# Patient Record
Sex: Male | Born: 1940 | Race: White | Hispanic: No | Marital: Married | Smoking: Former smoker
Health system: Southern US, Community
[De-identification: ages and names within clinical notes are randomized; demographics above are authoritative.]

## PROBLEM LIST (undated history)

## (undated) DIAGNOSIS — R300 Dysuria: Secondary | ICD-10-CM

## (undated) DIAGNOSIS — K279 Peptic ulcer, site unspecified, unspecified as acute or chronic, without hemorrhage or perforation: Secondary | ICD-10-CM

## (undated) DIAGNOSIS — K219 Gastro-esophageal reflux disease without esophagitis: Secondary | ICD-10-CM

## (undated) DIAGNOSIS — I6350 Cerebral infarction due to unspecified occlusion or stenosis of unspecified cerebral artery: Secondary | ICD-10-CM

## (undated) DIAGNOSIS — R609 Edema, unspecified: Secondary | ICD-10-CM

## (undated) DIAGNOSIS — Z8546 Personal history of malignant neoplasm of prostate: Secondary | ICD-10-CM

## (undated) DIAGNOSIS — G4700 Insomnia, unspecified: Secondary | ICD-10-CM

## (undated) DIAGNOSIS — M1990 Unspecified osteoarthritis, unspecified site: Secondary | ICD-10-CM

## (undated) DIAGNOSIS — E785 Hyperlipidemia, unspecified: Secondary | ICD-10-CM

## (undated) DIAGNOSIS — K922 Gastrointestinal hemorrhage, unspecified: Secondary | ICD-10-CM

## (undated) DIAGNOSIS — H547 Unspecified visual loss: Secondary | ICD-10-CM

## (undated) DIAGNOSIS — J309 Allergic rhinitis, unspecified: Secondary | ICD-10-CM

## (undated) DIAGNOSIS — I639 Cerebral infarction, unspecified: Secondary | ICD-10-CM

## (undated) DIAGNOSIS — I779 Disorder of arteries and arterioles, unspecified: Secondary | ICD-10-CM

## (undated) DIAGNOSIS — N184 Chronic kidney disease, stage 4 (severe): Secondary | ICD-10-CM

## (undated) DIAGNOSIS — F0390 Unspecified dementia without behavioral disturbance: Secondary | ICD-10-CM

## (undated) DIAGNOSIS — M545 Low back pain: Secondary | ICD-10-CM

## (undated) DIAGNOSIS — F528 Other sexual dysfunction not due to a substance or known physiological condition: Secondary | ICD-10-CM

## (undated) DIAGNOSIS — I739 Peripheral vascular disease, unspecified: Secondary | ICD-10-CM

## (undated) DIAGNOSIS — R7302 Impaired glucose tolerance (oral): Secondary | ICD-10-CM

## (undated) DIAGNOSIS — G2581 Restless legs syndrome: Secondary | ICD-10-CM

## (undated) DIAGNOSIS — C801 Malignant (primary) neoplasm, unspecified: Secondary | ICD-10-CM

## (undated) DIAGNOSIS — D649 Anemia, unspecified: Secondary | ICD-10-CM

## (undated) DIAGNOSIS — R001 Bradycardia, unspecified: Secondary | ICD-10-CM

## (undated) DIAGNOSIS — J439 Emphysema, unspecified: Secondary | ICD-10-CM

## (undated) DIAGNOSIS — N183 Chronic kidney disease, stage 3 (moderate): Principal | ICD-10-CM

## (undated) DIAGNOSIS — G4710 Hypersomnia, unspecified: Secondary | ICD-10-CM

## (undated) DIAGNOSIS — I2510 Atherosclerotic heart disease of native coronary artery without angina pectoris: Secondary | ICD-10-CM

## (undated) DIAGNOSIS — N390 Urinary tract infection, site not specified: Secondary | ICD-10-CM

## (undated) HISTORY — DX: Peripheral vascular disease, unspecified: I73.9

## (undated) HISTORY — PX: ROTATOR CUFF REPAIR: SHX139

## (undated) HISTORY — DX: Hyperlipidemia, unspecified: E78.5

## (undated) HISTORY — DX: Impaired glucose tolerance (oral): R73.02

## (undated) HISTORY — DX: Chronic kidney disease, stage 4 (severe): N18.4

## (undated) HISTORY — DX: Chronic kidney disease, stage 3 (moderate): N18.3

## (undated) HISTORY — DX: Other sexual dysfunction not due to a substance or known physiological condition: F52.8

## (undated) HISTORY — DX: Cerebral infarction, unspecified: I63.9

## (undated) HISTORY — DX: Disorder of arteries and arterioles, unspecified: I77.9

## (undated) HISTORY — DX: Insomnia, unspecified: G47.00

## (undated) HISTORY — DX: Bradycardia, unspecified: R00.1

## (undated) HISTORY — PX: EYE SURGERY: SHX253

## (undated) HISTORY — DX: Anemia, unspecified: D64.9

## (undated) HISTORY — DX: Peptic ulcer, site unspecified, unspecified as acute or chronic, without hemorrhage or perforation: K27.9

## (undated) HISTORY — DX: Allergic rhinitis, unspecified: J30.9

## (undated) HISTORY — DX: Personal history of malignant neoplasm of prostate: Z85.46

## (undated) HISTORY — DX: Hypersomnia, unspecified: G47.10

## (undated) HISTORY — DX: Edema, unspecified: R60.9

## (undated) HISTORY — DX: Restless legs syndrome: G25.81

## (undated) HISTORY — DX: Low back pain: M54.5

## (undated) HISTORY — DX: Cerebral infarction due to unspecified occlusion or stenosis of unspecified cerebral artery: I63.50

## (undated) HISTORY — PX: CARDIAC CATHETERIZATION: SHX172

## (undated) HISTORY — DX: Gastrointestinal hemorrhage, unspecified: K92.2

## (undated) HISTORY — DX: Unspecified osteoarthritis, unspecified site: M19.90

## (undated) HISTORY — PX: OTHER SURGICAL HISTORY: SHX169

## (undated) HISTORY — DX: Dysuria: R30.0

## (undated) HISTORY — DX: Emphysema, unspecified: J43.9

## (undated) HISTORY — DX: Unspecified visual loss: H54.7

## (undated) HISTORY — DX: Urinary tract infection, site not specified: N39.0

---

## 1999-02-20 ENCOUNTER — Encounter: Payer: Self-pay | Admitting: *Deleted

## 1999-02-20 ENCOUNTER — Emergency Department (HOSPITAL_COMMUNITY): Admission: EM | Admit: 1999-02-20 | Discharge: 1999-02-20 | Payer: Self-pay | Admitting: *Deleted

## 2000-01-27 ENCOUNTER — Encounter: Payer: Self-pay | Admitting: Emergency Medicine

## 2000-01-27 ENCOUNTER — Emergency Department (HOSPITAL_COMMUNITY): Admission: EM | Admit: 2000-01-27 | Discharge: 2000-01-27 | Payer: Self-pay | Admitting: Emergency Medicine

## 2000-07-12 ENCOUNTER — Other Ambulatory Visit: Admission: RE | Admit: 2000-07-12 | Discharge: 2000-07-12 | Payer: Self-pay | Admitting: Urology

## 2000-07-12 ENCOUNTER — Encounter (INDEPENDENT_AMBULATORY_CARE_PROVIDER_SITE_OTHER): Payer: Self-pay | Admitting: Specialist

## 2000-07-19 ENCOUNTER — Encounter: Admission: RE | Admit: 2000-07-19 | Discharge: 2000-07-19 | Payer: Self-pay | Admitting: Urology

## 2000-07-19 ENCOUNTER — Encounter: Payer: Self-pay | Admitting: Urology

## 2000-08-14 HISTORY — PX: PROSTATECTOMY: SHX69

## 2000-08-14 HISTORY — PX: OTHER SURGICAL HISTORY: SHX169

## 2000-08-15 ENCOUNTER — Encounter: Payer: Self-pay | Admitting: Urology

## 2000-08-22 ENCOUNTER — Encounter (INDEPENDENT_AMBULATORY_CARE_PROVIDER_SITE_OTHER): Payer: Self-pay | Admitting: Specialist

## 2000-08-22 ENCOUNTER — Inpatient Hospital Stay (HOSPITAL_COMMUNITY): Admission: RE | Admit: 2000-08-22 | Discharge: 2000-08-26 | Payer: Self-pay | Admitting: Urology

## 2001-02-13 ENCOUNTER — Ambulatory Visit (HOSPITAL_BASED_OUTPATIENT_CLINIC_OR_DEPARTMENT_OTHER): Admission: RE | Admit: 2001-02-13 | Discharge: 2001-02-13 | Payer: Self-pay

## 2001-08-12 ENCOUNTER — Ambulatory Visit: Admission: RE | Admit: 2001-08-12 | Discharge: 2001-11-10 | Payer: Self-pay | Admitting: Radiation Oncology

## 2001-12-11 ENCOUNTER — Ambulatory Visit: Admission: RE | Admit: 2001-12-11 | Discharge: 2002-03-11 | Payer: Self-pay | Admitting: *Deleted

## 2003-01-28 ENCOUNTER — Ambulatory Visit (HOSPITAL_COMMUNITY): Admission: RE | Admit: 2003-01-28 | Discharge: 2003-01-28 | Payer: Self-pay | Admitting: Family Medicine

## 2003-01-28 ENCOUNTER — Encounter: Payer: Self-pay | Admitting: Family Medicine

## 2004-12-07 ENCOUNTER — Ambulatory Visit (HOSPITAL_COMMUNITY): Admission: RE | Admit: 2004-12-07 | Discharge: 2004-12-07 | Payer: Self-pay | Admitting: Urology

## 2006-12-19 ENCOUNTER — Ambulatory Visit: Payer: Self-pay | Admitting: Internal Medicine

## 2007-01-10 ENCOUNTER — Ambulatory Visit: Payer: Self-pay

## 2007-03-13 ENCOUNTER — Encounter: Admission: RE | Admit: 2007-03-13 | Discharge: 2007-03-13 | Payer: Self-pay | Admitting: Surgery

## 2007-04-04 ENCOUNTER — Ambulatory Visit: Payer: Self-pay | Admitting: Internal Medicine

## 2007-04-05 DIAGNOSIS — F528 Other sexual dysfunction not due to a substance or known physiological condition: Secondary | ICD-10-CM

## 2007-04-05 DIAGNOSIS — M1990 Unspecified osteoarthritis, unspecified site: Secondary | ICD-10-CM

## 2007-04-05 DIAGNOSIS — M545 Low back pain, unspecified: Secondary | ICD-10-CM | POA: Insufficient documentation

## 2007-04-05 DIAGNOSIS — Z8546 Personal history of malignant neoplasm of prostate: Secondary | ICD-10-CM

## 2007-04-05 DIAGNOSIS — K279 Peptic ulcer, site unspecified, unspecified as acute or chronic, without hemorrhage or perforation: Secondary | ICD-10-CM | POA: Insufficient documentation

## 2007-04-05 DIAGNOSIS — I10 Essential (primary) hypertension: Secondary | ICD-10-CM | POA: Insufficient documentation

## 2007-04-05 HISTORY — DX: Personal history of malignant neoplasm of prostate: Z85.46

## 2007-04-05 HISTORY — DX: Low back pain, unspecified: M54.50

## 2007-04-05 HISTORY — DX: Unspecified osteoarthritis, unspecified site: M19.90

## 2007-04-05 HISTORY — DX: Other sexual dysfunction not due to a substance or known physiological condition: F52.8

## 2007-04-05 HISTORY — DX: Peptic ulcer, site unspecified, unspecified as acute or chronic, without hemorrhage or perforation: K27.9

## 2007-05-15 ENCOUNTER — Encounter: Payer: Self-pay | Admitting: Internal Medicine

## 2007-05-15 ENCOUNTER — Ambulatory Visit: Payer: Self-pay | Admitting: Internal Medicine

## 2007-05-15 DIAGNOSIS — J019 Acute sinusitis, unspecified: Secondary | ICD-10-CM | POA: Insufficient documentation

## 2007-05-15 DIAGNOSIS — G2581 Restless legs syndrome: Secondary | ICD-10-CM

## 2007-05-15 DIAGNOSIS — J309 Allergic rhinitis, unspecified: Secondary | ICD-10-CM | POA: Insufficient documentation

## 2007-05-15 HISTORY — DX: Restless legs syndrome: G25.81

## 2007-05-15 HISTORY — DX: Allergic rhinitis, unspecified: J30.9

## 2007-08-15 HISTORY — PX: OTHER SURGICAL HISTORY: SHX169

## 2007-09-18 ENCOUNTER — Encounter: Payer: Self-pay | Admitting: Internal Medicine

## 2007-10-02 ENCOUNTER — Encounter (INDEPENDENT_AMBULATORY_CARE_PROVIDER_SITE_OTHER): Payer: Self-pay | Admitting: *Deleted

## 2007-10-02 ENCOUNTER — Ambulatory Visit: Payer: Self-pay | Admitting: Internal Medicine

## 2007-10-02 DIAGNOSIS — R5383 Other fatigue: Secondary | ICD-10-CM

## 2007-10-02 DIAGNOSIS — G4710 Hypersomnia, unspecified: Secondary | ICD-10-CM

## 2007-10-02 DIAGNOSIS — R5381 Other malaise: Secondary | ICD-10-CM

## 2007-10-02 HISTORY — DX: Hypersomnia, unspecified: G47.10

## 2007-10-04 LAB — CONVERTED CEMR LAB
Albumin: 4 g/dL (ref 3.5–5.2)
BUN: 19 mg/dL (ref 6–23)
Basophils Absolute: 0.1 10*3/uL (ref 0.0–0.1)
Basophils Relative: 0.9 % (ref 0.0–1.0)
Chloride: 102 meq/L (ref 96–112)
Creatinine, Ser: 1.2 mg/dL (ref 0.4–1.5)
Eosinophils Relative: 3.8 % (ref 0.0–5.0)
HCT: 38.3 % — ABNORMAL LOW (ref 39.0–52.0)
Hemoglobin: 12.9 g/dL — ABNORMAL LOW (ref 13.0–17.0)
Hgb A1c MFr Bld: 5.4 % (ref 4.6–6.0)
Lymphocytes Relative: 21.9 % (ref 12.0–46.0)
Monocytes Absolute: 0.6 10*3/uL (ref 0.2–0.7)
Monocytes Relative: 7.6 % (ref 3.0–11.0)
Platelets: 193 10*3/uL (ref 150–400)
RBC: 4.43 M/uL (ref 4.22–5.81)
Sodium: 139 meq/L (ref 135–145)
TSH: 1.56 microintl units/mL (ref 0.35–5.50)
Total Protein: 6.3 g/dL (ref 6.0–8.3)

## 2007-10-18 ENCOUNTER — Ambulatory Visit: Payer: Self-pay | Admitting: Pulmonary Disease

## 2007-10-18 LAB — CONVERTED CEMR LAB
Saturation Ratios: 21.5 % (ref 20.0–50.0)
Transferrin: 302.1 mg/dL (ref >=212.0)

## 2007-10-24 ENCOUNTER — Ambulatory Visit (HOSPITAL_COMMUNITY): Admission: RE | Admit: 2007-10-24 | Discharge: 2007-10-25 | Payer: Self-pay | Admitting: Surgery

## 2007-10-24 ENCOUNTER — Encounter (INDEPENDENT_AMBULATORY_CARE_PROVIDER_SITE_OTHER): Payer: Self-pay | Admitting: Surgery

## 2008-01-07 ENCOUNTER — Encounter: Payer: Self-pay | Admitting: Internal Medicine

## 2008-01-13 ENCOUNTER — Telehealth: Payer: Self-pay | Admitting: Internal Medicine

## 2008-01-22 ENCOUNTER — Ambulatory Visit: Payer: Self-pay | Admitting: Internal Medicine

## 2008-01-22 DIAGNOSIS — M25519 Pain in unspecified shoulder: Secondary | ICD-10-CM

## 2008-01-24 ENCOUNTER — Encounter: Payer: Self-pay | Admitting: Internal Medicine

## 2008-05-04 ENCOUNTER — Telehealth (INDEPENDENT_AMBULATORY_CARE_PROVIDER_SITE_OTHER): Payer: Self-pay | Admitting: *Deleted

## 2008-06-08 ENCOUNTER — Ambulatory Visit: Payer: Self-pay | Admitting: Internal Medicine

## 2008-07-14 ENCOUNTER — Encounter: Payer: Self-pay | Admitting: Internal Medicine

## 2008-07-14 ENCOUNTER — Telehealth: Payer: Self-pay | Admitting: Internal Medicine

## 2008-07-15 ENCOUNTER — Ambulatory Visit: Payer: Self-pay | Admitting: Internal Medicine

## 2008-07-15 DIAGNOSIS — E739 Lactose intolerance, unspecified: Secondary | ICD-10-CM | POA: Insufficient documentation

## 2008-07-15 DIAGNOSIS — H547 Unspecified visual loss: Secondary | ICD-10-CM

## 2008-07-15 HISTORY — DX: Unspecified visual loss: H54.7

## 2008-07-16 ENCOUNTER — Ambulatory Visit: Payer: Self-pay

## 2008-07-16 ENCOUNTER — Encounter: Payer: Self-pay | Admitting: Internal Medicine

## 2008-07-22 ENCOUNTER — Encounter: Admission: RE | Admit: 2008-07-22 | Discharge: 2008-07-22 | Payer: Self-pay | Admitting: Internal Medicine

## 2008-07-23 ENCOUNTER — Encounter: Payer: Self-pay | Admitting: Internal Medicine

## 2008-07-23 DIAGNOSIS — I6350 Cerebral infarction due to unspecified occlusion or stenosis of unspecified cerebral artery: Secondary | ICD-10-CM | POA: Insufficient documentation

## 2008-07-28 ENCOUNTER — Encounter (INDEPENDENT_AMBULATORY_CARE_PROVIDER_SITE_OTHER): Payer: Self-pay | Admitting: *Deleted

## 2008-07-30 ENCOUNTER — Ambulatory Visit: Payer: Self-pay | Admitting: Internal Medicine

## 2008-07-30 DIAGNOSIS — E785 Hyperlipidemia, unspecified: Secondary | ICD-10-CM

## 2008-07-30 DIAGNOSIS — I739 Peripheral vascular disease, unspecified: Secondary | ICD-10-CM

## 2008-07-30 HISTORY — DX: Hyperlipidemia, unspecified: E78.5

## 2008-08-17 ENCOUNTER — Ambulatory Visit: Payer: Self-pay | Admitting: Internal Medicine

## 2008-08-17 LAB — CONVERTED CEMR LAB
Albumin: 4.1 g/dL (ref 3.5–5.2)
Alkaline Phosphatase: 79 units/L (ref 39–117)
Cholesterol: 123 mg/dL (ref 0–200)
VLDL: 14 mg/dL (ref 0–40)

## 2008-09-02 ENCOUNTER — Encounter: Payer: Self-pay | Admitting: Internal Medicine

## 2008-10-21 ENCOUNTER — Encounter: Payer: Self-pay | Admitting: Internal Medicine

## 2008-11-11 ENCOUNTER — Encounter: Payer: Self-pay | Admitting: Internal Medicine

## 2008-11-19 ENCOUNTER — Encounter: Payer: Self-pay | Admitting: Internal Medicine

## 2009-01-15 ENCOUNTER — Ambulatory Visit: Payer: Self-pay | Admitting: Internal Medicine

## 2009-02-03 ENCOUNTER — Ambulatory Visit: Payer: Self-pay | Admitting: Internal Medicine

## 2009-02-03 LAB — CONVERTED CEMR LAB
Albumin: 3.7 g/dL (ref 3.5–5.2)
BUN: 27 mg/dL — ABNORMAL HIGH (ref 6–23)
Basophils Relative: 1 % (ref 0.0–3.0)
Bilirubin Urine: NEGATIVE
Chloride: 110 meq/L (ref 96–112)
Creatinine, Ser: 1.3 mg/dL (ref 0.4–1.5)
Eosinophils Relative: 3.8 % (ref 0.0–5.0)
HDL: 35.8 mg/dL — ABNORMAL LOW (ref >39.00)
Ketones, ur: NEGATIVE mg/dL
LDL Cholesterol: 117 mg/dL — ABNORMAL HIGH (ref 0–99)
Lymphs Abs: 1.8 10*3/uL (ref 0.7–4.0)
MCHC: 34.6 g/dL (ref 30.0–36.0)
Monocytes Absolute: 0.6 10*3/uL (ref 0.1–1.0)
Monocytes Relative: 9.7 % (ref 3.0–12.0)
Neutro Abs: 3.8 10*3/uL (ref 1.4–7.7)
Nitrite: NEGATIVE
PSA: 0 ng/mL — ABNORMAL LOW (ref 0.10–4.00)
RBC: 3.79 M/uL — ABNORMAL LOW (ref 4.22–5.81)
RDW: 11.8 % (ref 11.5–14.6)
Sodium: 142 meq/L (ref 135–145)
TSH: 1.55 microintl units/mL (ref 0.35–5.50)
Total CHOL/HDL Ratio: 5
Triglycerides: 75 mg/dL (ref 0.0–149.0)
Urobilinogen, UA: 0.2 (ref 0.0–1.0)
VLDL: 15 mg/dL (ref 0.0–40.0)
WBC: 6.6 10*3/uL (ref 4.5–10.5)

## 2009-02-08 ENCOUNTER — Ambulatory Visit: Payer: Self-pay | Admitting: Internal Medicine

## 2009-06-22 ENCOUNTER — Encounter: Payer: Self-pay | Admitting: Internal Medicine

## 2009-08-09 ENCOUNTER — Ambulatory Visit: Payer: Self-pay | Admitting: Internal Medicine

## 2009-08-09 LAB — CONVERTED CEMR LAB
CO2: 27 meq/L (ref 19–32)
Chloride: 102 meq/L (ref 96–112)
Creatinine, Ser: 1.1 mg/dL (ref 0.4–1.5)
Glucose, Bld: 92 mg/dL (ref 70–99)
HDL: 39 mg/dL — ABNORMAL LOW (ref >39.00)
Hgb A1c MFr Bld: 5.4 % (ref 4.6–6.5)
LDL Cholesterol: 100 mg/dL — ABNORMAL HIGH (ref 0–99)
Sodium: 138 meq/L (ref 135–145)
VLDL: 15.4 mg/dL (ref 0.0–40.0)

## 2009-08-11 ENCOUNTER — Ambulatory Visit: Payer: Self-pay | Admitting: Internal Medicine

## 2009-09-02 ENCOUNTER — Encounter: Payer: Self-pay | Admitting: Internal Medicine

## 2009-09-22 ENCOUNTER — Inpatient Hospital Stay (HOSPITAL_COMMUNITY): Admission: RE | Admit: 2009-09-22 | Discharge: 2009-09-26 | Payer: Self-pay | Admitting: Orthopedic Surgery

## 2009-09-22 HISTORY — PX: OTHER SURGICAL HISTORY: SHX169

## 2009-10-22 ENCOUNTER — Ambulatory Visit: Payer: Self-pay | Admitting: Internal Medicine

## 2009-10-22 ENCOUNTER — Telehealth: Payer: Self-pay | Admitting: Internal Medicine

## 2009-10-22 DIAGNOSIS — M79609 Pain in unspecified limb: Secondary | ICD-10-CM

## 2009-10-22 DIAGNOSIS — G4700 Insomnia, unspecified: Secondary | ICD-10-CM

## 2009-10-22 DIAGNOSIS — R609 Edema, unspecified: Secondary | ICD-10-CM

## 2009-10-22 HISTORY — DX: Edema, unspecified: R60.9

## 2009-10-22 HISTORY — DX: Insomnia, unspecified: G47.00

## 2009-10-28 ENCOUNTER — Encounter: Payer: Self-pay | Admitting: Internal Medicine

## 2009-10-29 ENCOUNTER — Ambulatory Visit: Payer: Self-pay

## 2009-10-29 ENCOUNTER — Encounter: Payer: Self-pay | Admitting: Internal Medicine

## 2009-11-12 ENCOUNTER — Encounter: Payer: Self-pay | Admitting: Internal Medicine

## 2009-11-29 ENCOUNTER — Inpatient Hospital Stay (HOSPITAL_COMMUNITY): Admission: RE | Admit: 2009-11-29 | Discharge: 2009-12-02 | Payer: Self-pay | Admitting: Orthopedic Surgery

## 2010-01-04 ENCOUNTER — Telehealth (INDEPENDENT_AMBULATORY_CARE_PROVIDER_SITE_OTHER): Payer: Self-pay | Admitting: *Deleted

## 2010-02-01 ENCOUNTER — Ambulatory Visit: Payer: Self-pay | Admitting: Internal Medicine

## 2010-02-01 LAB — CONVERTED CEMR LAB
ALT: 20 units/L (ref 0–53)
AST: 20 units/L (ref 0–37)
Alkaline Phosphatase: 118 units/L — ABNORMAL HIGH (ref 39–117)
BUN: 18 mg/dL (ref 6–23)
Basophils Absolute: 0 10*3/uL (ref 0.0–0.1)
Bilirubin Urine: NEGATIVE
Bilirubin, Direct: 0.1 mg/dL (ref 0.0–0.3)
Calcium: 9.4 mg/dL (ref 8.4–10.5)
Cholesterol: 154 mg/dL (ref 0–200)
Creatinine,U: 92.4 mg/dL
Eosinophils Absolute: 0.2 10*3/uL (ref 0.0–0.7)
Eosinophils Relative: 3.3 % (ref 0.0–5.0)
GFR calc non Af Amer: 74.13 mL/min (ref >60)
Glucose, Bld: 95 mg/dL (ref 70–99)
HCT: 35.7 % — ABNORMAL LOW (ref 39.0–52.0)
Hemoglobin, Urine: NEGATIVE
Hemoglobin: 12.2 g/dL — ABNORMAL LOW (ref 13.0–17.0)
Ketones, ur: NEGATIVE mg/dL
LDL Cholesterol: 96 mg/dL (ref 0–99)
Leukocytes, UA: NEGATIVE
MCHC: 34.2 g/dL (ref 30.0–36.0)
Microalb Creat Ratio: 3.9 mg/g (ref 0.0–30.0)
Neutrophils Relative %: 63 % (ref 43.0–77.0)
Platelets: 178 10*3/uL (ref 150.0–400.0)
RDW: 12.8 % (ref 11.5–14.6)
Sodium: 141 meq/L (ref 135–145)
Specific Gravity, Urine: 1.02 (ref 1.000–1.030)
TSH: 0.8 microintl units/mL (ref 0.35–5.50)
Total Bilirubin: 0.6 mg/dL (ref 0.3–1.2)
Total Protein, Urine: NEGATIVE mg/dL
Urine Glucose: NEGATIVE mg/dL
Urobilinogen, UA: 0.2 (ref 0.0–1.0)

## 2010-02-08 ENCOUNTER — Ambulatory Visit: Payer: Self-pay | Admitting: Internal Medicine

## 2010-02-21 ENCOUNTER — Emergency Department (HOSPITAL_COMMUNITY): Admission: EM | Admit: 2010-02-21 | Discharge: 2010-02-21 | Payer: Self-pay | Admitting: Emergency Medicine

## 2010-02-22 ENCOUNTER — Telehealth: Payer: Self-pay | Admitting: Internal Medicine

## 2010-06-09 ENCOUNTER — Telehealth: Payer: Self-pay | Admitting: Internal Medicine

## 2010-08-17 ENCOUNTER — Telehealth: Payer: Self-pay | Admitting: Internal Medicine

## 2010-09-11 LAB — CONVERTED CEMR LAB
ALT: 26 units/L (ref 0–53)
Alkaline Phosphatase: 92 units/L (ref 39–117)
BUN: 18 mg/dL (ref 6–23)
Basophils Relative: 0.8 % (ref 0.0–3.0)
Bilirubin Urine: NEGATIVE
CO2: 30 meq/L (ref 19–32)
Calcium: 10 mg/dL (ref 8.4–10.5)
Cholesterol: 179 mg/dL (ref 0–200)
Eosinophils Relative: 3.1 % (ref 0.0–5.0)
GFR calc non Af Amer: 79 mL/min
Glucose, Bld: 101 mg/dL — ABNORMAL HIGH (ref 70–99)
Ketones, ur: NEGATIVE mg/dL
Leukocytes, UA: NEGATIVE
Monocytes Absolute: 0.8 10*3/uL (ref 0.1–1.0)
Monocytes Relative: 9.3 % (ref 3.0–12.0)
Neutro Abs: 5.4 10*3/uL (ref 1.4–7.7)
Neutrophils Relative %: 63 % (ref 43.0–77.0)
Sodium: 141 meq/L (ref 135–145)
TSH: 1.55 microintl units/mL (ref 0.35–5.50)
Total CHOL/HDL Ratio: 4.9
Total Protein: 7.1 g/dL (ref 6.0–8.3)
Urobilinogen, UA: 0.2 (ref 0.0–1.0)
VLDL: 21 mg/dL (ref 0–40)
Vitamin B-12: 220 pg/mL (ref 211–911)
pH: 6.5 (ref 5.0–8.0)

## 2010-09-13 NOTE — Assessment & Plan Note (Signed)
Summary: 6 MO ROV /NWS  #   Vital Signs:  Patient profile:   70 year old male Height:      68 inches Weight:      146 pounds BMI:     22.28 O2 Sat:      97 % on Room air Temp:     98.6 degrees F oral Pulse rate:   75 / minute BP sitting:   154 / 72  (left arm) Cuff size:   regular  Vitals Entered By: Shirlean Mylar Ewing CMA (AAMA) (February 08, 2010 10:21 AM)  O2 Flow:  Room air  CC: 6 month ROV/RE   Primary Care Provider:  Cathlean Cower  CC:  6 month ROV/RE.  History of Present Illness: here to f/u;  he take BP freq a tome - usualy < 140/90;  ;  Pt denies CP, sob, doe, wheezing, orthopnea, pnd, worsening LE edema, palps, dizziness or syncope  Pt denies new neuro symptoms such as headache, facial or extremity weakness   Preventive Screening-Counseling & Management      Drug Use:  no.    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) 4)  Preventive Health Care  (ICD-V70.0) 5)  Hyperlipidemia  (ICD-272.4) 6)  Peripheral Vascular Disease  (ICD-443.9) 7)  Cerebrovascular Accident  (ICD-434.91) 8)  Fatigue  (ICD-780.79) 9)  Unspecified Visual Loss  (ICD-369.9) 10)  Glucose Intolerance  (ICD-271.3) 11)  Shoulder Pain, Left  (ICD-719.41) 12)  Hypersomnia  (ICD-780.54) 13)  Fatigue  (ICD-780.79) 14)  Sinusitis- Acute-nos  (ICD-461.9) 15)  Rhinitis, Allergic Nos  (ICD-477.9) 16)  Essential Hypertension  (ICD-401.9) 17)  Restless Leg Syndrome  (ICD-333.94) 18)  Erectile Dysfunction  (ICD-302.72) 19)  Low Back Pain  (ICD-724.2) 20)  Peptic Ulcer Disease  (ICD-533.90) 21)  Erectile Dysfunction  (ICD-302.72) 22)  Osteoarthritis  (ICD-715.90) 23)  Hypertension  (ICD-401.9) 24)  Prostate Cancer, Hx of  (ICD-V10.46)  Medications Prior to Update: 1)  Amlodipine Besylate 10 Mg Tabs (Amlodipine Besylate) .[name].. 1po Once Daily 2)  Hydrochlorothiazide 25 Mg  Tabs (Hydrochlorothiazide) .[name].. 1 By  Mouth Once Daily 3)  Ropinirole Hcl 2 Mg Tabs (Ropinirole Hcl) .[name].. 1 By Mouth Three Times A Day 4)  Diclofenac Sodium 75 Mg Tbec (Diclofenac Sodium) .[name].. 1 By Mouth Two Times A Day As Needed 5)  Adult Aspirin Ec Low Strength 81 Mg Tbec (Aspirin) .[name].. 1 By Mouth Once Daily 6)  Lisinopril 40 Mg Tabs (Lisinopril) .[name].. 1 By Mouth Once Daily 7)  Lovastatin 20 Mg Tabs (Lovastatin) .... 3 By Mouth Once Daily 8)  Omeprazole 20 Mg Tbec (Omeprazole) .[name].. 1 By Mouth Once Daily 9)  Zolpidem Tartrate 10 Mg Tabs (Zolpidem Tartrate) .[name].. 1 By Mouth At Bedtime  Current Medications (verified): 1)  Amlodipine Besylate 10 Mg Tabs (Amlodipine Besylate) .[name].. 1po Once Daily 2)  Hydrochlorothiazide 25 Mg  Tabs (Hydrochlorothiazide) .[name].. 1 By Mouth Once Daily 3)  Ropinirole Hcl 2 Mg Tabs (Ropinirole Hcl) .[name].. 1 By Mouth Three Times A Day 4)  Diclofenac Sodium 75 Mg Tbec (Diclofenac Sodium) .[name].. 1 By Mouth Two Times A Day As Needed 5)  Adult Aspirin Ec Low Strength 81 Mg Tbec (Aspirin) .[name].. 1 By Mouth Once Daily 6)  Lisinopril 40 Mg Tabs (Lisinopril) .[name].. 1 By Mouth Once Daily 7)  Lovastatin 20 Mg Tabs (Lovastatin) .... 3 By Mouth Once Daily 8)  Omeprazole 20 Mg Tbec (  Omeprazole) .[name].. 1 By Mouth Once Daily 9)  Zolpidem Tartrate 10 Mg Tabs (Zolpidem Tartrate) .[name].. 1 By Mouth At Bedtime  Allergies (verified): 1)  ! Benadryl 2)  ! Elavil 3)  Simvastatin  Past History:  Past Medical History: Last updated: 07/30/2008 Prostate cancer, hx of Hypertension Osteoarthritis- knees Erectile Dysfunction Peptic Ulcer disease Low back pain, recurrant Peptic ulcer disease e.d. 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 glucose intolerance mild MR Peripheral vascular disease - mild carotid Hyperlipidemia  Family History: Last updated: 10/02/2007 Family History Hypertension brother died with MI at 58 yo  Social History: Last updated: 02/08/2010 Former Smoker Alcohol use-yes Married 2  children retired - truck Geophysicist/field seismologist Drug use-no  Risk Factors: Smoking Status: quit (05/15/2007)  Past Surgical History: Inguinal herniorrhaphy - left - 2002 Rotator cuff repair - left knee surgery s/p ventral surgury 2009 s/p rad protatectomy 2002 Right hip replacement 09/22/2009 s/p left hip surgury  Social History: Reviewed history from 10/02/2007 and no changes required. 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         all otherwise negative per pt -    Physical Exam  General:  alert and well-developed.   Head:  normocephalic and atraumatic.   Eyes:  vision grossly intact, pupils equal, and pupils round.   Ears:  R ear normal and L ear normal.   Nose:  no external deformity and no nasal discharge.   Mouth:  no gingival abnormalities and pharynx pink and moist.   Neck:  supple and no masses.   Lungs:  normal respiratory effort and normal breath sounds.   Heart:  normal rate and regular rhythm.   Abdomen:  soft, non-tender, and normal bowel sounds.   Msk:  no joint tenderness and no joint swelling.   Extremities:  no edema, no erythema  Neurologic:  cranial nerves II-XII intact and strength normal in all extremities.   Skin:  color normal and no rashes.   Psych:  not anxious appearing and not depressed appearing.     Impression & Recommendations:  Problem # 1:  Preventive Health Care (ICD-V70.0) Overall doing well, age appropriate education and  counseling updated and referral for appropriate preventive services done unless declined, immunizations up to date or declined, diet counseling done if overweight, urged to quit smoking if smokes , most recent labs reviewed and current ordered if appropriate, ecg reviewed or declined (interpretation per ECG scanned in the EMR if done); information regarding Medicare Prevention requirements given if appropriate; speciality referrals updated as appropriate   Problem # 2:  ESSENTIAL HYPERTENSION (ICD-401.9)  His updated medication list for this problem includes:    Amlodipine Besylate 10 Mg Tabs (Amlodipine besylate) .[name]... 1po once daily    Hydrochlorothiazide 25 Mg Tabs (Hydrochlorothiazide) .[name]... 1 by mouth once daily    Lisinopril 40 Mg Tabs (Lisinopril) .[name]... 1 by mouth once daily  BP today: 154/72 Prior BP: 160/82 (10/22/2009)  Labs Reviewed: K+: 4.4 (02/01/2010) Creat: : 1.1 (02/01/2010)   Chol: 154 (02/01/2010)   HDL: 40.80 (02/01/2010)   LDL: 96 (02/01/2010)   TG: 87.0 (02/01/2010)  stable overall by hx and exam, ok to continue meds/tx as is  - pt declines change in tx  Complete Medication List: 1)  Amlodipine Besylate 10 Mg Tabs (Amlodipine besylate) .[name].. 1po once daily 2)  Hydrochlorothiazide 25 Mg Tabs (Hydrochlorothiazide) .[name].. 1 by mouth once daily 3)  Ropinirole Hcl 2 Mg Tabs (Ropinirole hcl) .[name].. 1 by mouth three times a day 4)  Diclofenac Sodium 75 Mg Tbec (Diclofenac sodium) .[name].. 1 by mouth two times a day as needed 5)  Adult Aspirin Ec Low Strength 81 Mg Tbec (Aspirin) .[name].. 1 by mouth once daily 6)  Lisinopril 40 Mg Tabs (Lisinopril) .[name].. 1 by mouth once daily 7)  Lovastatin 20 Mg Tabs (Lovastatin) .... 3 by mouth once daily 8)  Omeprazole 20 Mg Tbec (Omeprazole) .[name].. 1 by mouth once daily 9)  Zolpidem Tartrate 10 Mg Tabs (Zolpidem tartrate) .[name].. 1 by mouth at bedtime  Patient Instructions: 1)  Continue all previous medications as before this visit 2)  Please schedule a  follow-up appointment in 1 year or sooner if needed

## 2010-09-13 NOTE — Progress Notes (Signed)
  Phone Note Other Incoming   Request: Send information Summary of Call: Request for records received from [facility]. Request forwarded to [facility].     

## 2010-09-13 NOTE — Progress Notes (Signed)
  Phone Note Refill Request Message from:  Fax from Pharmacy on February 22, 2010 8:53 AM  Refills Requested: Medication #1:  OMEPRAZOLE 20 MG TBEC 1 by mouth once daily   Dosage confirmed as above?Dosage Confirmed   Last Refilled: 01/15/2009   Notes: [facility] Initial call taken by: Robin Ewing CMA Deborra Medina),  February 22, 2010 8:53 AM    Prescriptions: OMEPRAZOLE 20 MG TBEC (OMEPRAZOLE) 1 by mouth once daily  #30 x 11   Entered by:   Shirlean Mylar Ewing CMA ([place])   Authorized by:   Biagio Borg MD   Signed by:   Sharon Seller CMA ([place]) on 02/22/2010   Method used:   Faxed to ...       [facility] [address] [phone]* (retail)       [address]       [place], [place]  [address]       Ph: [phone] or [phone]       Fax: [phone]   RxID:   [id number]

## 2010-09-13 NOTE — Miscellaneous (Signed)
Summary: Orders Update  Clinical Lists Changes  Orders: Added new Test order of Venous Duplex Lower Extremity (Venous Duplex Lower) - Signed 

## 2010-09-13 NOTE — Progress Notes (Signed)
  Phone Note Refill Request Message from:  Fax from Pharmacy on June 09, 2010 9:43 AM  Refills Requested: Medication #1:  LISINOPRIL 40 MG TABS 1 by mouth once daily   Dosage confirmed as above?Dosage Confirmed   Last Refilled: 01/15/2009   Notes: [facility] Initial call taken by: Shirlean Mylar Ewing CMA Deborra Medina),  June 09, 2010 9:43 AM    Prescriptions: LISINOPRIL 40 MG TABS (LISINOPRIL) 1 by mouth once daily  #90 x 3   Entered by:   Sharon Seller CMA ([place])   Authorized by:   Biagio Borg MD   Signed by:   Sharon Seller CMA ([place]) on 06/09/2010   Method used:   Faxed to ...       [facility] [address] [phone]* (retail)       [address]       [place], [place]  [address]       Ph: [phone] or [phone]       Fax: [phone]   RxID:   [id number]

## 2010-09-13 NOTE — Progress Notes (Signed)
Summary: Amlodipine  Phone Note Call from Patient Call back at Home Phone [phone]   Summary of Call: Patient came in for office visit today and stated that we had not refilled his Amlodipine. I checked the system and made patient aware that we had not rec'd a request. I made patient aware that we would refill today, and the next time this happens to call our office to check behind his pharmacy. Patient expressed understanding. Initial call taken by: Ernestene Mention,  October 22, 2009 4:51 PM

## 2010-09-13 NOTE — Letter (Signed)
Summary: [facility]  [facility]   Imported By: Bubba Hales 09/06/2009 12:59:36  _____________________________________________________________________  External Attachment:    Type:   Image     Comment:   External Document

## 2010-09-13 NOTE — Letter (Signed)
Summary: [facility]roll[facility]  [facility]roll[facility]   Imported By: Rise Patience 11/25/2009 15:10:31  _____________________________________________________________________  External Attachment:    Type:   Image     Comment:   External Document

## 2010-09-13 NOTE — Assessment & Plan Note (Signed)
Summary: NOT SLEEPING WELL-NERVE PROBLEMS  STC   Vital Signs:  Patient profile:   70 year old male Height:      68 inches Weight:      139.50 pounds BMI:     21.29 O2 Sat:      98 % on Room air Temp:     97.6 degrees F oral Pulse rate:   74 / minute BP sitting:   160 / 82  (left arm)  Vitals Entered By: Ernestene Mention (October 22, 2009 4:34 PM)  O2 Flow:  Room air  CC: C/O not sleeping well and nervousness. Pt also has questions about Amlodipine./kb Is Patient Diabetic? No Pain Assessment Patient in pain? no        Primary Care Provider:  Cathlean Cower  CC:  C/O not sleeping well and nervousness. Pt also has questions about Amlodipine./kb.  History of Present Illness: overall doing OK, but c/o increased anxiety, stress and difficulty getting to sleep with recent health issues;  wife here for support ;  now s/p right hip surgury sucessful and rehabbing well walking with cane only;  he is off coumadin since feb 27;  he has not re-started his chronic HCTZ since the surgury and BP is elevated today;  does have some LE edema bilat since the surgury, but the swelling immed post op (with IVF's) to LLE has resolved, but has persistent RLE swelling - in fact may be mild worse and also menitons has had right calf discomfort for approx 1 wk which he attributes to compensating with walking the cane and more pressure on the right leg post op with therapy.  Pt denies CP, sob, doe, wheezing, orthopnea, pnd, worsening LE edema, palps, dizziness or syncope   Pt denies new neuro symptoms such as headache, facial or extremity weakness   No fever or other such as dysuria.    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) 4)  Preventive Health Care  (ICD-V70.0) 5)  Hyperlipidemia  (ICD-272.4) 6)  Peripheral Vascular Disease  (ICD-443.9) 7)  Cerebrovascular Accident  (ICD-434.91) 8)  Fatigue  (ICD-780.79) 9)   Unspecified Visual Loss  (ICD-369.9) 10)  Glucose Intolerance  (ICD-271.3) 11)  Shoulder Pain, Left  (ICD-719.41) 12)  Hypersomnia  (ICD-780.54) 13)  Fatigue  (ICD-780.79) 14)  Sinusitis- Acute-nos  (ICD-461.9) 15)  Rhinitis, Allergic Nos  (ICD-477.9) 16)  Essential Hypertension  (ICD-401.9) 17)  Restless Leg Syndrome  (ICD-333.94) 18)  Erectile Dysfunction  (ICD-302.72) 19)  Low Back Pain  (ICD-724.2) 20)  Peptic Ulcer Disease  (ICD-533.90) 21)  Erectile Dysfunction  (ICD-302.72) 22)  Osteoarthritis  (ICD-715.90) 23)  Hypertension  (ICD-401.9) 24)  Prostate Cancer, Hx of  (ICD-V10.46)  Medications Prior to Update: 1)  Amlodipine Besylate 10 Mg Tabs (Amlodipine Besylate) .[name].. 1po Once Daily 2)  Hydrochlorothiazide 25 Mg  Tabs (Hydrochlorothiazide) .[name].. 1 By Mouth Qd 3)  Ropinirole Hcl 2 Mg Tabs (Ropinirole Hcl) .[name].. 1 By Mouth Three Times A Day 4)  Diclofenac Sodium 75 Mg Tbec (Diclofenac Sodium) .[name].. 1 By Mouth Two Times A Day As Needed 5)  Adult Aspirin Ec Low Strength 81 Mg Tbec (Aspirin) .[name].. 1 By Mouth Once Daily 6)  Lisinopril 40 Mg Tabs (Lisinopril) .[name].. 1 By Mouth Once Daily 7)  Lovastatin 20 Mg Tabs (Lovastatin) .... 3 By Mouth Once Daily 8)  Omeprazole 20 Mg Tbec (Omeprazole) .[name].. 1 By Mouth Once Daily  Current Medications (verified): 1)  Amlodipine Besylate 10 Mg Tabs (Amlodipine Besylate) .[name].. 1po Once Daily 2)  Hydrochlorothiazide 25 Mg  Tabs (Hydrochlorothiazide) .[name].. 1 By Mouth Once Daily 3)  Ropinirole Hcl 2 Mg Tabs (Ropinirole Hcl) .[name].. 1 By Mouth Three Times A Day 4)  Diclofenac Sodium 75 Mg Tbec (Diclofenac Sodium) .[name].. 1 By Mouth Two Times A Day As Needed 5)  Adult Aspirin Ec Low Strength 81 Mg Tbec (Aspirin) .[name].. 1 By Mouth Once Daily 6)  Lisinopril 40 Mg Tabs (Lisinopril) .[name].. 1 By Mouth Once Daily 7)  Lovastatin 20 Mg Tabs (Lovastatin) .... 3 By Mouth Once Daily 8)  Omeprazole 20 Mg Tbec (Omeprazole) .[name].. 1 By Mouth Once Daily 9)  Zolpidem Tartrate 10 Mg  Tabs (Zolpidem Tartrate) .[name].. 1 By Mouth At Bedtime  Allergies (verified): 1)  ! Benadryl 2)  ! Elavil 3)  Simvastatin  Past History:  Past Medical History: Last updated: 07/30/2008 Prostate cancer, hx of Hypertension Osteoarthritis- knees Erectile Dysfunction Peptic Ulcer disease Low back pain, recurrant Peptic ulcer disease e.d. 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   Head:  normocephalic and atraumatic.   Eyes:  vision grossly intact, pupils equal, and pupils round.   Ears:  R ear normal and L ear normal.   Nose:  no external deformity and no nasal discharge.   Mouth:  no gingival abnormalities and pharynx pink and moist.   Neck:  supple and no masses.   Lungs:  normal respiratory effort and normal breath sounds.   Heart:  normal rate and regular rhythm.   Abdomen:  soft, non-tender, and normal bowel sounds.   Msk:  no joint tenderness and no joint swelling.such as the knees or ankles Extremities:  no edema, no erythema  Skin:  has trace to 1+ edema RLE only, wearing the compression stockings today;  left calf mild tender without cords, ? equivocal Homans   Impression & Recommendations:  Problem # 1:  PERIPHERAL EDEMA (ICD-782.3)  His updated medication list for this problem includes:    Hydrochlorothiazide 25 Mg Tabs (Hydrochlorothiazide) .[name]... 1 by mouth once daily to re-start the  hctz  Problem # 2:  ESSENTIAL HYPERTENSION (ICD-401.9)  His updated medication list for this problem includes:    Amlodipine Besylate 10 Mg Tabs (Amlodipine besylate) .[name]... 1po once daily    Hydrochlorothiazide 25 Mg Tabs (Hydrochlorothiazide) .[name]... 1 by mouth once daily    Lisinopril 40 Mg Tabs (Lisinopril) .[name]... 1 by mouth once daily as above- treat as above, f/u any worsening signs or symptoms   Problem # 3:  INSOMNIA-SLEEP DISORDER-UNSPEC (ICD-780.52)  His updated medication list for this problem includes:    Zolpidem Tartrate 10 Mg Tabs (Zolpidem tartrate) .[name]... 1 by mouth at bedtime treat as above, f/u any worsening signs or symptoms   Problem # 4:  CALF PAIN, RIGHT (ICD-729.5) absolutely refuses to go to ER for venous doppler tonight which I advised due to the  right calf pain new for the past wk post op, now off coumadin and with persistent edema as above; will try to arrange venous doppler asap;  pt advised to go to ER for any symptoms of increased pain, swelling, CP, sob, palp or syncope, fever, cough or other unusual.  Orders: Radiology Referral (Radiology)  Complete Medication List: 1)  Amlodipine Besylate 10 Mg Tabs (Amlodipine besylate) .[name].. 1po once daily 2)  Hydrochlorothiazide 25 Mg Tabs (Hydrochlorothiazide) .[name].. 1 by mouth once daily 3)  Ropinirole Hcl 2 Mg Tabs (Ropinirole hcl) .[name].. 1 by mouth three times a day 4)  Diclofenac Sodium 75 Mg Tbec (Diclofenac sodium) .[name].. 1 by mouth two times a day as needed 5)  Adult Aspirin Ec Low Strength 81 Mg Tbec (Aspirin) .[name].. 1 by mouth once daily 6)  Lisinopril 40 Mg Tabs (Lisinopril) .[name].. 1 by mouth once daily 7)  Lovastatin 20 Mg Tabs (Lovastatin) .... 3 by mouth once daily 8)  Omeprazole 20 Mg Tbec (Omeprazole) .[name].. 1 by mouth once daily 9)  Zolpidem Tartrate 10 Mg Tabs (Zolpidem tartrate) .[name].. 1 by mouth at bedtime  Patient Instructions: 1)  Please take all new medications as prescribed 2)  Continue all previous  medications as before this visit , including re-starting the fluid pill 3)  You will be contacted about the referral(s) to: Vein test for Monday 4)  Good luck with your left hip surgury 5)  Please schedule a follow-up appointment in 3 months with CPX labs Prescriptions: ZOLPIDEM TARTRATE 10 MG TABS (ZOLPIDEM TARTRATE) 1 by mouth at bedtime  #30 x 5   Entered and Authorized by:   Biagio Borg MD   Signed by:   Biagio Borg MD on 10/22/2009   Method used:   Print then Give to Patient   RxID:   [id number] HYDROCHLOROTHIAZIDE 25 MG  TABS (HYDROCHLOROTHIAZIDE) 1 by mouth once daily  #90 x 3   Entered and Authorized by:   Biagio Borg MD   Signed by:   Biagio Borg MD on 10/22/2009   Method used:   Electronically to        [facility] [address] [phone]* (retail)       [address]       [place], [place]  [address]       Ph: [phone] or [phone]       Fax: [phone]   RxID:   [id number]

## 2010-09-15 NOTE — Progress Notes (Signed)
Summary: RX Request  Phone Note Call from Patient Call back at Home Phone [phone]   Caller: Patient Call For: Biagio Borg MD Summary of Call: Pt is requesting to increase OMEPRAZOLE 20mg   to 40mg . Pt stated that 20mg  is not working for him. Please advise[facility] [address]   Initial call taken by: Phetcharat Noitamyae,  August 17, 2010 1:30 PM  Follow-up for Phone Call        ok to change- done per emr Follow-up by: Biagio Borg MD,  August 17, 2010 2:19 PM    New/Updated Medications: OMEPRAZOLE 40 MG CPDR (OMEPRAZOLE) 1po once daily Prescriptions: OMEPRAZOLE 40 MG CPDR (OMEPRAZOLE) 1po once daily  #90 x 3   Entered and Authorized by:   Biagio Borg MD   Signed by:   Biagio Borg MD on 08/17/2010   Method used:   Electronically to        [facility] [address] [phone]* (retail)       [address]       [place], [place]  [address]       Ph: [phone] or [phone]       Fax: [phone]   RxID:   [id number]

## 2010-10-07 ENCOUNTER — Encounter: Payer: Self-pay | Admitting: Internal Medicine

## 2010-10-07 ENCOUNTER — Ambulatory Visit (INDEPENDENT_AMBULATORY_CARE_PROVIDER_SITE_OTHER): Payer: Medicare Other

## 2010-10-07 DIAGNOSIS — I10 Essential (primary) hypertension: Secondary | ICD-10-CM

## 2010-10-10 ENCOUNTER — Telehealth: Payer: Self-pay | Admitting: Internal Medicine

## 2010-10-10 ENCOUNTER — Encounter: Payer: Self-pay | Admitting: Internal Medicine

## 2010-10-10 ENCOUNTER — Ambulatory Visit (INDEPENDENT_AMBULATORY_CARE_PROVIDER_SITE_OTHER): Payer: Medicare Other | Admitting: Internal Medicine

## 2010-10-10 ENCOUNTER — Other Ambulatory Visit: Payer: Self-pay | Admitting: Internal Medicine

## 2010-10-10 DIAGNOSIS — R300 Dysuria: Secondary | ICD-10-CM

## 2010-10-10 DIAGNOSIS — I10 Essential (primary) hypertension: Secondary | ICD-10-CM

## 2010-10-10 DIAGNOSIS — E739 Lactose intolerance, unspecified: Secondary | ICD-10-CM

## 2010-10-10 DIAGNOSIS — E785 Hyperlipidemia, unspecified: Secondary | ICD-10-CM

## 2010-10-10 HISTORY — DX: Dysuria: R30.0

## 2010-10-10 LAB — URINALYSIS, ROUTINE W REFLEX MICROSCOPIC
Bilirubin Urine: NEGATIVE
Ketones, ur: NEGATIVE
Nitrite: POSITIVE
Total Protein, Urine: 100
pH: 6 (ref 5.0–8.0)

## 2010-10-11 ENCOUNTER — Encounter: Payer: Self-pay | Admitting: Internal Medicine

## 2010-10-11 NOTE — Assessment & Plan Note (Signed)
Summary: elev bp---203/111---pt walked in/[place]   Nurse Visit   Patient walked in stating BP checked last night at [facility] was 203/111 and 3 days ago 223/103. Patient has had some headaches, blurred vision and wife said chest pain. Also wife state pt. very irratible. Checked BP this morning and was 138/70 and pt. not having any of the above mentioned symptoms. Patient schedule OV with Dr. Jenny Reichmann for monday 10/10/2010 to f/u with BP per Dr.Lenville Hibberd. Informed patient to  check BP at home over the weekend, bring readings and cuff he uses to OV on monday. Also instructed to go to ER over the weekend if any of the above symptoms he has mentioned continue to occur.   Vital Signs:  Patient profile:   70 year old male BP sitting:   138 / 70  (left arm) Cuff size:   regular  Vitals Entered By: Shirlean Mylar Ewing CMA Deborra Medina) (October 07, 2010 11:16 AM)  Allergies: 1)  ! Benadryl 2)  ! Elavil 3)  Simvastatin

## 2010-10-20 NOTE — Assessment & Plan Note (Signed)
Summary: f/u on blood pressure   Vital Signs:  Patient profile:   70 year old male Height:      68 inches Weight:      160.13 pounds BMI:     24.44 O2 Sat:      97 % on Room air Temp:     97.6 degrees F oral Pulse rate:   65 / minute BP sitting:   134 / 70  (left arm) Cuff size:   regular  Vitals Entered By: Shirlean Mylar Ewing CMA [facility]) (October 10, 2010 1:55 PM)  O2 Flow:  Room air CC: followup on Elevated BP, painful urinating/RE   Primary Care Provider:  Cathlean Cower  CC:  followup on Elevated BP and painful urinating/RE.  History of Present Illness: here for acute today;  has been checking BP several times in the past few wks - often has SBP at home 140 to 150, but over last 3 days with SBP in the 160's or higher, assoc with acute onset fever, lower abd pain, back pain unusual for him and dysuria with frequency, but no hematuria or urgency; has hx of UTI, and has recurrent mild urinary leakage s/p prostatectomy for prostate ca, despite good complacine with oxybutinin.  Still sees Dr Serita Butcher for urology, but he is retiring later this yr.  Pt denies CP, worsening sob, doe, wheezing, orthopnea, pnd, worsening LE edema, palps, dizziness or syncope  Pt denies new neuro symptoms such as headache, facial or extremity weakness  Pt denies polydipsia, polyuria  Overall good compliance with meds, trying to follow low chol diet, wt stable, little excercise however  No recent  wt loss, night sweats, loss of appetite or other constitutional symptoms  Last UTI over 10 yrs ago.    Problems Prior to Update: 1)  Dysuria  (ICD-788.1) 2)  Calf Pain, Right  (ICD-729.5) 3)  Peripheral Edema  (ICD-782.3) 4)  Insomnia-sleep Disorder-unspec  (ICD-780.52) 5)  Preventive Health Care  (ICD-V70.0) 6)  Hyperlipidemia  (ICD-272.4) 7)  Peripheral Vascular Disease  (ICD-443.9) 8)  Cerebrovascular Accident  (ICD-434.91) 9)  Fatigue  (ICD-780.79) 10)  Unspecified Visual Loss  (ICD-369.9) 11)  Glucose Intolerance   (ICD-271.3) 12)  Shoulder Pain, Left  (ICD-719.41) 13)  Hypersomnia  (ICD-780.54) 14)  Fatigue  (ICD-780.79) 15)  Sinusitis- Acute-nos  (ICD-461.9) 16)  Rhinitis, Allergic Nos  (ICD-477.9) 17)  Essential Hypertension  (ICD-401.9) 18)  Restless Leg Syndrome  (ICD-333.94) 19)  Erectile Dysfunction  (ICD-302.72) 20)  Low Back Pain  (ICD-724.2) 21)  Peptic Ulcer Disease  (ICD-533.90) 22)  Erectile Dysfunction  (ICD-302.72) 23)  Osteoarthritis  (ICD-715.90) 24)  Hypertension  (ICD-401.9) 25)  Prostate Cancer, Hx of  (ICD-V10.46)  Medications Prior to Update: 1)  Amlodipine Besylate 10 Mg Tabs (Amlodipine Besylate) .[name].. 1po Once Daily 2)  Hydrochlorothiazide 25 Mg  Tabs (Hydrochlorothiazide) .[name].. 1 By Mouth Once Daily 3)  Ropinirole Hcl 2 Mg Tabs (Ropinirole Hcl) .[name].. 1 By Mouth Three Times A Day 4)  Diclofenac Sodium 75 Mg Tbec (Diclofenac Sodium) .[name].. 1 By Mouth Two Times A Day As Needed 5)  Adult Aspirin Ec Low Strength 81 Mg Tbec (Aspirin) .[name].. 1 By Mouth Once Daily 6)  Lisinopril 40 Mg Tabs (Lisinopril) .[name].. 1 By Mouth Once Daily 7)  Lovastatin 20 Mg Tabs (Lovastatin) .... 3 By Mouth Once Daily 8)  Omeprazole 40 Mg Cpdr (Omeprazole) .[name].. 1po Once Daily 9)  Zolpidem Tartrate 10 Mg Tabs (Zolpidem Tartrate) .[name].. 1 By Mouth At Bedtime  Current  Medications (verified): 1)  Amlodipine Besylate 10 Mg Tabs (Amlodipine Besylate) .[name].. 1po Once Daily 2)  Hydrochlorothiazide 25 Mg  Tabs (Hydrochlorothiazide) .[name].. 1 By Mouth Once Daily 3)  Ropinirole Hcl 2 Mg Tabs (Ropinirole Hcl) .[name].. 1 By Mouth Three Times A Day 4)  Diclofenac Sodium 75 Mg Tbec (Diclofenac Sodium) .[name].. 1 By Mouth Two Times A Day As Needed 5)  Adult Aspirin Ec Low Strength 81 Mg Tbec (Aspirin) .[name].. 1 By Mouth Once Daily 6)  Lisinopril 40 Mg Tabs (Lisinopril) .[name].. 1 By Mouth Once Daily 7)  Lovastatin 20 Mg Tabs (Lovastatin) .... 3 By Mouth Once Daily 8)  Omeprazole 40 Mg Cpdr (Omeprazole) .[name].. 1po Once Daily 9)  Zolpidem  Tartrate 10 Mg Tabs (Zolpidem Tartrate) .[name].. 1 By Mouth At Bedtime 10)  Ciprofloxacin Hcl 500 Mg Tabs (Ciprofloxacin Hcl) .[name].. 1 By Mouth Two Times A Day 11)  Clonidine Hcl 0.1 Mg Tabs (Clonidine Hcl) .[name].. 1po Two Times A Day  Allergies (verified): 1)  ! Benadryl 2)  ! Elavil 3)  Simvastatin  Past History:  Past Medical History: Last updated: 07/30/2008 Prostate cancer, hx of Hypertension Osteoarthritis- knees Erectile Dysfunction Peptic Ulcer disease Low back pain, recurrant Peptic ulcer disease e.d. 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  , mild ill  Head:  normocephalic and atraumatic.   Eyes:  vision grossly intact, pupils equal, and pupils round.   Ears:  R ear normal and L ear normal.   Nose:  no external deformity and no nasal discharge.   Mouth:  no gingival abnormalities and pharynx pink and moist.   Neck:  supple and no masses.   Lungs:  normal respiratory effort and normal breath sounds.   Heart:  normal rate and regular rhythm.   Abdomen:  soft and normal bowel sounds.  but has mild lower abd tender without guarding or rebound Extremities:  no edema, no erythema  Psych:  not depressed appearing and slightly anxious.     Impression & Recommendations:  Problem # 1:  DYSURIA (ICD-788.1)  Orders: T-Culture, Urine  [id number]) TLB-Udip w/ Micro (81001-URINE)  His updated medication list for this problem includes:    Ciprofloxacin Hcl 500 Mg Tabs (Ciprofloxacin hcl) .[name]... 1 by mouth two times a day prob UTi;  to check urine studies, treat as above, f/u any worsening signs or symptoms   Encouraged to push clear liquids, get enough rest, and take acetaminophen as needed. To be seen in 10 days if no improvement, sooner if worse.  Problem # 2:  GLUCOSE INTOLERANCE (ICD-271.3) asymtp - declines further labs today , to call for onset polys or cbg > 200, recent labs reviewed with pt adn wife today  Problem # 3:  HYPERTENSION (ICD-401.9)  His updated medication list for this problem includes:    Amlodipine Besylate 10 Mg Tabs (Amlodipine besylate) .[name]... 1po once daily    Hydrochlorothiazide 25  Mg Tabs (Hydrochlorothiazide) .[name]... 1 by mouth once daily    Lisinopril 40 Mg Tabs (Lisinopril) .[name]... 1 by mouth once daily    Clonidine Hcl 0.1 Mg Tabs (Clonidine hcl) .[name]... 1po two times a day  BP today: 134/70 Prior BP: 138/70 (10/07/2010)  Labs Reviewed: K+: 4.4 (02/01/2010) Creat: : 1.1 (02/01/2010)   Chol: 154 (02/01/2010)   HDL: 40.80 (02/01/2010)   LDL: 96 (02/01/2010)   TG: 87.0 (02/01/2010) mult BP with machine validated today have been elevated in the 150's sbp - for add clonidine as above   Problem # 4:  HYPERLIPIDEMIA (ICD-272.4)  His updated medication list for this problem includes:    Lovastatin 20 Mg Tabs (Lovastatin) .Marland KitchenMarland KitchenMarland KitchenMarland Kitchen 3 by mouth once daily  Labs Reviewed: SGOT: 20 (02/01/2010)   SGPT: 20 (02/01/2010)   HDL:40.80 (02/01/2010), 39.00 (08/09/2009)  LDL:96 (02/01/2010), 100 (08/09/2009)  Chol:154 (02/01/2010), 154 (08/09/2009)  Trig:87.0 (02/01/2010), 77.0 (08/09/2009) stable overall by hx and exam, ok to continue meds/tx as is , Pt to continue diet efforts, good med tolerance; to check labs next visit - goal LDL less than 70 with known carotid disease  Complete Medication List: 1)   Amlodipine Besylate 10 Mg Tabs (Amlodipine besylate) .[name].. 1po once daily 2)  Hydrochlorothiazide 25 Mg Tabs (Hydrochlorothiazide) .[name].. 1 by mouth once daily 3)  Ropinirole Hcl 2 Mg Tabs (Ropinirole hcl) .[name].. 1 by mouth three times a day 4)  Diclofenac Sodium 75 Mg Tbec (Diclofenac sodium) .[name].. 1 by mouth two times a day as needed 5)  Adult Aspirin Ec Low Strength 81 Mg Tbec (Aspirin) .[name].. 1 by mouth once daily 6)  Lisinopril 40 Mg Tabs (Lisinopril) .[name].. 1 by mouth once daily 7)  Lovastatin 20 Mg Tabs (Lovastatin) .... 3 by mouth once daily 8)  Omeprazole 40 Mg Cpdr (Omeprazole) .[name].. 1po once daily 9)  Zolpidem Tartrate 10 Mg Tabs (Zolpidem tartrate) .[name].. 1 by mouth at bedtime 10)  Ciprofloxacin Hcl 500 Mg Tabs (Ciprofloxacin hcl) .[name].. 1 by mouth two times a day 11)  Clonidine Hcl 0.1 Mg Tabs (Clonidine hcl) .[name].. 1po two times a day  Patient Instructions: 1)  Please take all new medications as prescribed - the antibiotic, and the clonidine for blood pressure 2)  Continue all previous medications as before this visit , including all the other  BP medicines for now 3)  Please go to the Lab in the basement for your urine tests today  4)  Please schedule a follow-up appointment in 2 weeks. Prescriptions: CLONIDINE HCL 0.1 MG TABS (CLONIDINE HCL) 1po two times a day  #60 x 11   Entered and Authorized by:   Biagio Borg MD   Signed by:   Biagio Borg MD on 10/10/2010   Method used:   Print then Give to Patient   RxID:   [id number] CIPROFLOXACIN HCL 500 MG TABS (CIPROFLOXACIN HCL) 1 by mouth two times a day  #20 x 0   Entered and Authorized by:   Biagio Borg MD   Signed by:   Biagio Borg MD on 10/10/2010   Method used:   Print then Give to Patient   RxID:   [id number]    Orders Added: 1)  T-Culture, Urine [id number] 2)  TLB-Udip w/ Micro [81001-URINE] 3)  Est. Patient Level IV [id number] 4)  Est. Patient Level IV [id number]

## 2010-10-20 NOTE — Progress Notes (Signed)
----   Converted from flag ---- ---- 10/10/2010 2:37 PM, Biagio Borg MD wrote: noted  ---- 10/10/2010 2:37 PM, Shirlean Mylar Ewing CMA (AAMA) wrote: Checked pts. BP with his own cuff 152/84 and checked immediately after with our cuff and was 152/80. ------------------------------

## 2010-10-24 ENCOUNTER — Ambulatory Visit (INDEPENDENT_AMBULATORY_CARE_PROVIDER_SITE_OTHER): Payer: Medicare Other | Admitting: Internal Medicine

## 2010-10-24 ENCOUNTER — Encounter: Payer: Self-pay | Admitting: Internal Medicine

## 2010-10-24 DIAGNOSIS — N390 Urinary tract infection, site not specified: Secondary | ICD-10-CM

## 2010-10-24 DIAGNOSIS — R001 Bradycardia, unspecified: Secondary | ICD-10-CM

## 2010-10-24 DIAGNOSIS — I10 Essential (primary) hypertension: Secondary | ICD-10-CM

## 2010-10-24 DIAGNOSIS — I498 Other specified cardiac arrhythmias: Secondary | ICD-10-CM | POA: Insufficient documentation

## 2010-10-24 HISTORY — DX: Urinary tract infection, site not specified: N39.0

## 2010-10-24 HISTORY — DX: Bradycardia, unspecified: R00.1

## 2010-10-24 LAB — CONVERTED CEMR LAB
Cholesterol, target level: 200 mg/dL
LDL Goal: 100 mg/dL

## 2010-10-31 ENCOUNTER — Telehealth: Payer: Self-pay | Admitting: Internal Medicine

## 2010-11-01 LAB — PROTIME-INR
INR: 1.24 (ref 0.00–1.49)
Prothrombin Time: 14.4 seconds (ref 11.6–15.2)
Prothrombin Time: 15.5 seconds — ABNORMAL HIGH (ref 11.6–15.2)

## 2010-11-01 LAB — CBC
HCT: 28.7 % — ABNORMAL LOW (ref 39.0–52.0)
HCT: 28.8 % — ABNORMAL LOW (ref 39.0–52.0)
Hemoglobin: 10.5 g/dL — ABNORMAL LOW (ref 13.0–17.0)
MCHC: 34.5 g/dL (ref 30.0–36.0)
MCHC: 34.7 g/dL (ref 30.0–36.0)
MCHC: 34.7 g/dL (ref 30.0–36.0)
MCV: 92.5 fL (ref 78.0–100.0)
MCV: 92.8 fL (ref 78.0–100.0)
Platelets: 185 10*3/uL (ref 150–400)
RBC: 3.3 MIL/uL — ABNORMAL LOW (ref 4.22–5.81)
RDW: 12.7 % (ref 11.5–15.5)
WBC: 10.1 10*3/uL (ref 4.0–10.5)
WBC: 12 10*3/uL — ABNORMAL HIGH (ref 4.0–10.5)

## 2010-11-01 NOTE — Assessment & Plan Note (Signed)
Summary: 2 WK FU [place]  #   Vital Signs:  Patient profile:   70 year old male Height:      68 inches Weight:      163.50 pounds BMI:     24.95 O2 Sat:      98 % on Room air Temp:     97.7 degrees F oral Pulse rate:   47 / minute BP sitting:   112 / 62  (left arm) Cuff size:   regular  Vitals Entered By: Shirlean Mylar Ewing CMA ([place]) (October 24, 2010 9:22 AM)  O2 Flow:  Room air CC: 2 week followup/RE, Lipid Management   Primary Care Provider:  Cathlean Cower  CC:  2 week followup/RE and Lipid Management.  History of Present Illness: here to fu;  overall doing ok except for mild dry mouth he thinks he can put up with;  BP at home has been improved to the 130's sSBP for the most part, not sure about HR since  he did not write this down;  Pt denies CP, worsening sob, doe, wheezing, orthopnea, pnd, worsening LE edema, palps, dizziness or syncope  Pt denies new neuro symptoms such as headache, facial or extremity weakness  Pt denies polydipsia, polyuria,  Overall good compliance with meds, trying to follow low chol diet, wt stable, little excercise however . Overall good compliance with meds, and good tolerability other than above.  No fever, wt loss, night sweats, loss of appetite or other constitutional symptoms  Denies worsening depressive symptoms, suicidal ideation, or panic, though does have ongoing anxiety.  DId tolerate the cipro well, finished med and denies further fever, abd pain, dyruai, freq, urgency, hematuria or back pain.    Lipid Management History:      Positive NCEP/ATP III risk factors include male age 40 years old or older, hypertension, prior stroke (or TIA), and peripheral vascular disease.  Negative NCEP/ATP III risk factors include non-tobacco-user status.     Problems Prior to Update: 1)  Dysuria  (ICD-788.1) 2)  Calf Pain, Right  (ICD-729.5) 3)  Peripheral Edema  (ICD-782.3) 4)  Insomnia-sleep Disorder-unspec  (ICD-780.52) 5)  Preventive Health Care  (ICD-V70.0) 6)   Hyperlipidemia  (ICD-272.4) 7)  Peripheral Vascular Disease  (ICD-443.9) 8)  Cerebrovascular Accident  (ICD-434.91) 9)  Fatigue  (ICD-780.79) 10)  Unspecified Visual Loss  (ICD-369.9) 11)  Glucose Intolerance  (ICD-271.3) 12)  Shoulder Pain, Left  (ICD-719.41) 13)  Hypersomnia  (ICD-780.54) 14)  Fatigue  (ICD-780.79) 15)  Sinusitis- Acute-nos  (ICD-461.9) 16)  Rhinitis, Allergic Nos  (ICD-477.9) 17)  Essential Hypertension  (ICD-401.9) 18)  Restless Leg Syndrome  (ICD-333.94) 19)  Erectile Dysfunction  (ICD-302.72) 20)  Low Back Pain  (ICD-724.2) 21)  Peptic Ulcer Disease  (ICD-533.90) 22)  Erectile Dysfunction  (ICD-302.72) 23)  Osteoarthritis  (ICD-715.90) 24)  Hypertension  (ICD-401.9) 25)  Prostate Cancer, Hx of  (ICD-V10.46)  Medications Prior to Update: 1)  Amlodipine Besylate 10 Mg Tabs (Amlodipine Besylate) .[name].. 1po Once Daily 2)  Hydrochlorothiazide 25 Mg  Tabs (Hydrochlorothiazide) .[name].. 1 By Mouth Once Daily 3)  Ropinirole Hcl 2 Mg Tabs (Ropinirole Hcl) .[name].. 1 By Mouth Three Times A Day 4)  Diclofenac Sodium 75 Mg Tbec (Diclofenac Sodium) .[name].. 1 By Mouth Two Times A Day As Needed 5)  Adult Aspirin Ec Low Strength 81 Mg Tbec (Aspirin) .[name].. 1 By Mouth Once Daily 6)  Lisinopril 40 Mg Tabs (Lisinopril) .[name].. 1 By Mouth Once Daily 7)  Lovastatin 20 Mg Tabs (  Lovastatin) .... 3 By Mouth Once Daily 8)  Omeprazole 40 Mg Cpdr (Omeprazole) .[name].. 1po Once Daily 9)  Zolpidem Tartrate 10 Mg Tabs (Zolpidem Tartrate) .[name].. 1 By Mouth At Bedtime 10)  Ciprofloxacin Hcl 500 Mg Tabs (Ciprofloxacin Hcl) .[name].. 1 By Mouth Two Times A Day 11)  Clonidine Hcl 0.1 Mg Tabs (Clonidine Hcl) .[name].. 1po Two Times A Day  Current Medications (verified): 1)  Amlodipine Besylate 10 Mg Tabs (Amlodipine Besylate) .[name].. 1po Once Daily 2)  Hydrochlorothiazide 25 Mg  Tabs (Hydrochlorothiazide) .[name].. 1 By Mouth Once Daily 3)  Ropinirole Hcl 2 Mg Tabs (Ropinirole Hcl) .[name].. 1 By Mouth Three Times A Day 4)  Diclofenac  Sodium 75 Mg Tbec (Diclofenac Sodium) .[name].. 1 By Mouth Two Times A Day As Needed 5)  Adult Aspirin Ec Low Strength 81 Mg Tbec (Aspirin) .[name].. 1 By Mouth Once Daily 6)  Lisinopril 40 Mg Tabs (Lisinopril) .[name].. 1 By Mouth Once Daily 7)  Lovastatin 20 Mg Tabs (Lovastatin) .... 3 By Mouth Once Daily 8)  Omeprazole 40 Mg Cpdr (Omeprazole) .[name].. 1po Once Daily 9)  Zolpidem Tartrate 10 Mg Tabs (Zolpidem Tartrate) .[name].. 1 By Mouth At Bedtime 10)  Ciprofloxacin Hcl 500 Mg Tabs (Ciprofloxacin Hcl) .[name].. 1 By Mouth Two Times A Day 11)  Clonidine Hcl 0.1 Mg Tabs (Clonidine Hcl) .[name].. 1po Two Times A Day  Allergies (verified): 1)  ! Benadryl 2)  ! Elavil 3)  Simvastatin  Past History:  Past Medical History: Last updated: 07/30/2008 Prostate cancer, hx of Hypertension Osteoarthritis- knees Erectile Dysfunction Peptic Ulcer disease Low back pain, recurrant Peptic ulcer disease e.d. 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 Head:  normocephalic and atraumatic.   Eyes:  vision grossly intact, pupils equal, and pupils round.   Ears:  R ear normal and L ear normal.   Nose:  no external deformity and no nasal discharge.   Mouth:  no gingival abnormalities and pharynx pink and moist.   Neck:  supple and no masses.   Lungs:  normal respiratory effort and normal breath sounds.    Heart:  normal rate and regular rhythm.   Abdomen:  soft and normal bowel sounds.  but has mild lower abd tender without guarding or rebound Msk:  no flank tender bilat Extremities:  no edema, no erythema  Skin:  color normal and no rashes.   Psych:  not depressed appearing and slightly anxious.     Impression & Recommendations:  Problem # 1:  HYPERTENSION (ICD-401.9)  His updated medication list for this problem includes:    Amlodipine Besylate 10 Mg Tabs (Amlodipine besylate) .[name]... 1po once daily    Hydrochlorothiazide 25 Mg Tabs (Hydrochlorothiazide) .[name]... 1 by mouth once daily    Lisinopril 40 Mg Tabs (Lisinopril) .[name]... 1 by mouth once daily    Clonidine Hcl 0.1 Mg Tabs (Clonidine hcl) .[name]... 1po two times a day improved but  does have mild dry mouth ? assoc (or could be the oxybutinin or additve effect0;  also mild bradycardia (asympt);  ok to cont for now but he is instructed to check BP and HR at home and call in one wk with resutls  Problem # 2:  BRADYCARDIA (ICD-427.89)  His updated medication list for this problem includes:    Adult Aspirin Ec Low Strength 81 Mg Tbec (Aspirin) .[name]... 1 by mouth once daily new, likely related to the clonidine;  asympt;  will cont med as is for now, but he will cont to monitor daily BP and HR for the next wk and call with results  Problem # 3:  UTI (ICD-599.0)  His updated medication list for this problem includes:    Ciprofloxacin Hcl 500 Mg Tabs (Ciprofloxacin hcl) .[name]... 1 by mouth two times a day finished med,  cx results d/w pt;  no current GU symtpoms to suggest UTI or BPH ;  declines urology referral at this time  Complete Medication List: 1)  Amlodipine Besylate 10 Mg Tabs (Amlodipine besylate) .[name].. 1po once daily 2)  Hydrochlorothiazide 25 Mg Tabs (Hydrochlorothiazide) .[name].. 1 by mouth once daily 3)  Ropinirole Hcl 2 Mg Tabs (Ropinirole hcl) .[name].. 1 by mouth three times a day 4)  Diclofenac Sodium 75 Mg Tbec (Diclofenac sodium) .[name].. 1  by mouth two times a day as needed 5)  Adult Aspirin Ec Low Strength 81 Mg Tbec (Aspirin) .[name].. 1 by mouth once daily 6)  Lisinopril 40 Mg Tabs (Lisinopril) .[name].. 1 by mouth once daily 7)  Lovastatin 20 Mg Tabs (Lovastatin) .... 3 by mouth once daily 8)  Omeprazole 40 Mg Cpdr (Omeprazole) .[name].. 1po once daily 9)  Zolpidem Tartrate 10 Mg Tabs (Zolpidem tartrate) .[name].. 1 by mouth at bedtime 10)  Ciprofloxacin Hcl 500 Mg Tabs (Ciprofloxacin hcl) .[name].. 1 by mouth two times a day 11)  Clonidine Hcl 0.1 Mg Tabs (Clonidine hcl) .[name].. 1po two times a day  Lipid Assessment/Plan:      Based on NCEP/ATP III, the patient's risk factor category is "history of coronary disease, peripheral vascular disease, cerebrovascular disease, or aortic aneurysm".  The patient's lipid goals are as follows: Total cholesterol goal is 200; LDL cholesterol goal is 100; HDL cholesterol goal is 40; Triglyceride goal is 150.    Patient Instructions: 1)  Continue all previous medications as before this visit 2)  Please check your Blood Pressure and Heart Rate (the pulse) every day for one wk, and call with results to [phone] 3)  Please schedule a follow-up appointment in 3 months. for CPX wit labs, or sooner if needed Prescriptions: LISINOPRIL 40 MG TABS (LISINOPRIL) 1 by mouth once daily  #90 x 3   Entered and Authorized by:   Biagio Borg MD   Signed by:   Biagio Borg MD on 10/24/2010   Method used:   Electronically to        [facility] [address] [phone]* (retail)       [address]       [place], [place]  [address]       Ph: [phone] or [phone]       Fax: [phone]   RxID:   [id number]    Orders Added: 1)  Est. Patient Level IV [id number]

## 2010-11-02 LAB — URINALYSIS, ROUTINE W REFLEX MICROSCOPIC
Glucose, UA: NEGATIVE mg/dL
Hgb urine dipstick: NEGATIVE
Ketones, ur: NEGATIVE mg/dL
Nitrite: NEGATIVE
Protein, ur: NEGATIVE mg/dL
Protein, ur: NEGATIVE mg/dL
Specific Gravity, Urine: 1.025 (ref 1.005–1.030)
Urobilinogen, UA: 1 mg/dL (ref 0.0–1.0)

## 2010-11-02 LAB — BASIC METABOLIC PANEL
BUN: 11 mg/dL (ref 6–23)
BUN: 26 mg/dL — ABNORMAL HIGH (ref 6–23)
CO2: 28 mEq/L (ref 19–32)
CO2: 29 mEq/L (ref 19–32)
Calcium: 9.2 mg/dL (ref 8.4–10.5)
Chloride: 99 mEq/L (ref 96–112)
Chloride: 104 mEq/L (ref 96–112)
Creatinine, Ser: 1.04 mg/dL (ref 0.4–1.5)
GFR calc Af Amer: >60 mL/min (ref >60)
Glucose, Bld: 114 mg/dL — ABNORMAL HIGH (ref 70–99)
Glucose, Bld: 164 mg/dL — ABNORMAL HIGH (ref 70–99)
Glucose, Bld: 121 mg/dL — ABNORMAL HIGH (ref 70–99)
Potassium: 4.5 mEq/L (ref 3.5–5.1)
Sodium: 140 mEq/L (ref 135–145)

## 2010-11-02 LAB — CBC
HCT: 27.8 % — ABNORMAL LOW (ref 39.0–52.0)
HCT: 32.7 % — ABNORMAL LOW (ref 39.0–52.0)
Hemoglobin: 10.1 g/dL — ABNORMAL LOW (ref 13.0–17.0)
Hemoglobin: 12.8 g/dL — ABNORMAL LOW (ref 13.0–17.0)
MCHC: 34.5 g/dL (ref 30.0–36.0)
MCHC: 34.6 g/dL (ref 30.0–36.0)
MCV: 93.6 fL (ref 78.0–100.0)
MCV: 93.8 fL (ref 78.0–100.0)
MCV: 92.6 fL (ref 78.0–100.0)
Platelets: 149 10*3/uL — ABNORMAL LOW (ref 150–400)
Platelets: 177 10*3/uL (ref 150–400)
Platelets: 187 10*3/uL (ref 150–400)
Platelets: 182 10*3/uL (ref 150–400)
RBC: 3.44 MIL/uL — ABNORMAL LOW (ref 4.22–5.81)
RDW: 11.8 % (ref 11.5–15.5)
RDW: 11.9 % (ref 11.5–15.5)
RDW: 12.1 % (ref 11.5–15.5)
RDW: 13 % (ref 11.5–15.5)

## 2010-11-02 LAB — DIFFERENTIAL
Basophils Absolute: 0 10*3/uL (ref 0.0–0.1)
Eosinophils Absolute: 0.3 10*3/uL (ref 0.0–0.7)
Eosinophils Relative: 3 % (ref 0–5)
Lymphocytes Relative: 22 % (ref 12–46)
Lymphs Abs: 1.7 10*3/uL (ref 0.7–4.0)
Monocytes Relative: 7 % (ref 3–12)
Neutrophils Relative %: 69 % (ref 43–77)

## 2010-11-02 LAB — PROTIME-INR
INR: 0.94 (ref 0.00–1.49)
INR: 1.31 (ref 0.00–1.49)
Prothrombin Time: 12.5 seconds (ref 11.6–15.2)
Prothrombin Time: 13.7 seconds (ref 11.6–15.2)
Prothrombin Time: 15.6 seconds — ABNORMAL HIGH (ref 11.6–15.2)
Prothrombin Time: 12.7 seconds (ref 11.6–15.2)

## 2010-11-02 LAB — TYPE AND SCREEN
ABO/RH(D): A NEG
ABO/RH(D): A NEG
Antibody Screen: NEGATIVE

## 2010-11-10 NOTE — Progress Notes (Signed)
----   Converted from flag ---- ---- 10/31/2010 10:51 AM, Biagio Borg MD wrote: Information reviewed;  BP and HR stable and not overcontrolled;  to cont same meds  ---- 10/31/2010 10:51 AM, Shirlean Mylar Ewing CMA (AAMA) wrote: called and spoke to the pts. wife and he is going to drop them off today  ---- 10/24/2010 1:38 PM, Biagio Borg MD wrote: please call pt - he was supposed to call with HR and BP today ------------------------------

## 2010-11-23 ENCOUNTER — Other Ambulatory Visit: Payer: Self-pay

## 2010-11-23 MED ORDER — AMLODIPINE BESYLATE 10 MG PO TABS: 10.0000 mg | ORAL_TABLET | Freq: Every day | ORAL | Status: DC

## 2010-11-23 NOTE — Telephone Encounter (Signed)
Pharmacy requesting refill on Amlodipine 10 mg, last refill 10/25/09 #30 with 10 refills and last OV 3/12.

## 2010-12-27 NOTE — Op Note (Signed)
Victor Davis, Victor Davis NO.:  [id number]   MEDICAL RECORD NO.:  [id number]          PATIENT TYPE:  AMB   LOCATION:  DAY                          FACILITY:  [facility]   PHYSICIAN:  Earnstine Regal, MD      DATE OF BIRTH:  [birth year]   DATE OF PROCEDURE:  10/24/2007  DATE OF DISCHARGE:                               OPERATIVE REPORT   PREOPERATIVE DIAGNOSIS:  Recurrent ventral hernia.   POSTOPERATIVE DIAGNOSIS:  Recurrent ventral hernia.   PROCEDURE:  Repair recurrent ventral incisional hernia with Parietex  mesh.   SURGEON:  Earnstine Regal, MD, FACS   ANESTHESIA:  General per Dr. Alfredo Martinez.   ESTIMATED BLOOD LOSS:  Minimal.   PREPARATION:  Betadine.   COMPLICATIONS:  None.   INDICATIONS:  The patient is a 70 year old white male who underwent  repair of incarcerated ventral hernia in September 2008.  Bard Ventralex  mesh was employed.  The patient noted a progressive bulge.  He was seen  in the office in February 2009 and scheduled for repair.   BODY OF REPORT:  Procedure is done in OR #11 at the [facility].  The patient is brought to the operating room,  placed in supine position on the operating room table.  Following  administration of general anesthesia, the patient is prepped and draped  in usual strict aseptic fashion.  After ascertaining that an adequate  level of anesthesia been achieved, a left subcostal incision was made  with a #15 blade.  Using an OptiVu port, the fiberoptic scope is  advanced through the abdominal wall and into the peritoneal cavity.  Carbon dioxide was insufflated and pneumoperitoneum was established.  Scope was inserted.  There are omental adhesions to the area of the  umbilicus.  The Bard Ventralex mesh is evident.  The left inferior edge  of the mesh was pulled loose from the overlying fascia and allowed for  recurrence of the hernia.  Operative ports were placed with 5 mm trocars  in the left lower  quadrant, right lower quadrant and right upper  quadrant.  Using Endoshears and electrocautery, the previous Ventralex  mesh is completely excised from the abdominal wall and then extracted  through the left upper quadrant port.  It is submitted to pathology.   Next a 12 cm circular Parietex mesh is selected.  4-0 Novofil sutures  were placed circumferentially.  Mesh was dampened and then rolled and  inserted through the trocar into the peritoneal cavity.  It is deployed.  Incisions were made on the abdominal wall and sutures were retrieved  through the abdominal wall.  These were then pulled tight and tied  securely elevating the mesh into nice approximation over the defect with  at least a 4 cm overlap circumferentially.  Using a titanium tack, the  mesh is secured with two concentric circles of tacks to the abdominal  wall.  Good hemostasis noted.  Remainder of the abdomen was explored  briefly with the scope including a left inguinal hernia repair.  Mesh is  visible.  There  may be slight indirect type recurrence at the lateral  aspect of the mesh although this was less than 1 cm in size and does not  contain any viscera.  No other abnormality is identified.  Ports are  removed under direct vision and good hemostasis is noted at all port  sites.  Pneumoperitoneum was released and all ports were removed.  Wounds were closed with interrupted 4-0 Monocryl subcuticular sutures.  Wounds were washed and dried and Benzoin Steri-Strips are applied.  Sterile dressings were applied.  Abdominal binder was applied.  The  patient is awakened from anesthesia and brought to the recovery room in  stable condition.  The patient tolerated the procedure well.      Earnstine Regal, MD  Electronically Signed     TMG/MEDQ  D:  10/24/2007  T:  10/25/2007  Job:  [id number]   cc:   Biagio Borg, MD  [address]  [place] [address]

## 2010-12-30 NOTE — Op Note (Signed)
[facility]  Patient:    Victor Davis, Victor Davis                       MRN: [id number] Proc. Date: 08/22/00 Adm. Date:  [id number] Attending:  Kristine Royal                           Operative Report  PREOPERATIVE DIAGNOSIS:  Carcinoma of the prostate.  POSTOPERATIVE DIAGNOSIS:  Carcinoma of the prostate.  OPERATIONS: 1. Radical retropubic prostatectomy. 2. Bilateral pelvic lymph node dissection.  ANESTHESIA:  General.  SURGEON:  Shanda Bumps., M.D.  ASSISTANT:  Lillette Boxer. Dahlstedt, M.D.  DESCRIPTION OF PROCEDURE:  The patient was placed on the operating room in the supine position with a rolled towel under his sacrum where he was induced with general anesthesia, shaved, prepped, and draped in the usual sterile fashion. A #24 Foley was inserted and the bladder drained.  The metered dose inhaler was carried down to the subcutaneous tissues to expose the midline and the retroperitoneal space was entered.  The wound was packed opened with the Buchwalter retractor and the nodes on the right side, which was the patients most involved side, were first addressed.  A node packet from the external iliac down to and including the obturator fossa back to the bifurcation of the iliac was taken.  These nodes were fairly small and benign.  The efferent lymphatics were clipped or tied as they were encountered.  The endopelvic fascia was then incised on this side.  On the patients left side, a similar node packet was taken beginning at the external iliac down to and including the obturator fossa back to the bifurcation of the iliac, again clipping or tying the efferent lymphatics as they were encountered.  Again these nodes were felt to be benign as well.  The endopelvic fascia was incised on this side as well.  The patient was then put back to midline and the puboprostatic ligaments were cut under direct vision.  Using a Hoenfelter clamp  underneath the dorsal vein complex, two ties of 1-0 Vicryl and a back tie of 0 Vicryl were then used.  Another back tie with 0 Vicryl at the bladder neck was also done and the dorsal vein complex was then cut with the cautery.  This exposed the apex of the prostate and the urethra.  A right angle clamp was passed underneath the urethra, elevated, and then the urethra was then cut under direct vision.  The Foley catheter was the pulled anteriorly and superiorly into the operative field.  The posterior urethra was then cut.  The plane beneath the rectum and Denonvilliers fascia was then incised and developed sharply and bluntly.  No attempt at nerve sparing was done due to the high-grade tumor as the pedicle was taken on either side back to the level of the seminal vesicles bilaterally.  When this was done, attention was then turned to the bladder neck where the urethra was carefully dissected free from the prostate as the prostate was then carefully separated from the bladder.  The bladder neck was quite good, but there was a small piece of tissue anteriorly which was sent as a separate specimen.  The vas deferens were isolated in the midline, clipped, and cut and the seminal vesicles were dissected out to their tips and clamped with a right angle clip and ligated with a silk suture.  The prostate was then removed from the field.  The wound was then irrigated and seemed to be fairly hemostatic. The bladder neck was then reconstructed with using interrupted 3-0 chromic catgut suture everting the mucosa back to the bladder neck.  When this was done, the bladder neck was a little bit patulous, so a single 2-0 chromic stitch was used to plicate the bladder neck at about 5 oclock so that it would just admit my small finger.  When this was done, a Janne Lab sound was then passed per urethra and sutures were placed on the urethral stump with 2-0 Vicryl at 12, 2, 4, and 10 oclock.  The Blenheim  sound was then removed and a #20 Foley catheter inserted.  This was passed into the bladder and the 5 cc balloon inflated to 10 cc.  The sutures on the urethral stitches were then placed on the corresponding area of the bladder neck.  When this was pulled down and was tied, the anastomosis was seen to be watertight and the irrigant was clear.  A JP drain was then passed through a separate stab incision on the right side, placed in the retroperitoneal space, and secured to the skin with a 3-0 nylon. The fascia was closed with a running 1-0 PDS and skin with clips.  Sterile dressings were then applied.  The patient was then taken to the recovery room in good condition.  The sponge, needle, and instrument counts were correct. The estimated blood loss was 600 cc.  No transfusions were given.  He started out with a hematocrit of 31% and after the prostatectomy, the hematocrit was 27%.  This will be watched closely as he two units of autologous blood if needed.  He was sent to the recovery room in good condition. DD:  08/22/00 TD:  08/22/00 Job: [id number]

## 2010-12-30 NOTE — Op Note (Signed)
[facility]  Patient:    Victor Davis, Victor Davis                       MRN: [id number] Proc. Date: 02/13/01 Adm. Date:  [id number] Attending:  Tiffany Kocher                           Operative Report  PREOPERATIVE DIAGNOSIS:  Indirect left inguinal hernia.  POSTOPERATIVE DIAGNOSIS:  Indirect left inguinal hernia.  OPERATION PERFORMED:  Repair of left inguinal hernia.  SURGEON:  Ward Givens, M.D.  ANESTHESIA:  Local with sedation.  DESCRIPTION OF PROCEDURE:  After the patient was adequately monitored and sedated and had routine preparation and draping of the left inguinal region. I liberally infused local anesthetic in the operative field blocking the ilioinguinal and iliohypogastric nerves, reinforcing the block as necessary. The patient remained stable through the procedure.  He was quite comfortable. I made a short transverse incision beginning just above and lateral to the pubic tubercle, dissected down to the external oblique and opened it in the direction of the fibers into the external ring.  I dissected out the cord taking great care to avoid injury to the ilioinguinal, iliohypogastric and genitofemoral nerves.  There was no direct hernia.  I found a large indirect hernia, separated it from the cord structures, reduced it through the internal ring and plugged the superior portion of the internal ring with a generous plug of polypropylene mesh held in with a 2-0 silk stitch.  I fashioned a patch of polypropylene mesh with a slit in it to allow exit of the spermatic cord and cut to fit the inguinal floor.  I sewed it in with 2-0 Prolene beginning at the pubic tubercle and sewing laterally and inferiorly in the inguinal ligament with a running stitch and sewing superiorly and medially in the internal oblique fascia with a basting stitch.  I joined the tails of the mesh together with two sutures lateral to the internal ring and put the  tails of the mesh laterally under the external oblique.  I closed the external oblique and subcutaneous tissues with separate layers of running 3-0 Vicryl and closed the skin with intercuticular 4-0 Vicryl and Steri-Strips.  The patient tolerated the procedure well. DD:  02/13/01 TD:  02/13/01 Job: [id number]

## 2010-12-30 NOTE — Discharge Summary (Signed)
[facility]  Patient:    Victor Davis, Victor Davis                       MRN: [id number] Adm. Date:  [id number] Disc. Date: [id number] Attending:  Kristine Royal                           Discharge Summary  DISCHARGE DIAGNOSES: 1. 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 2. Elevated prostate-specific antigen. 3. Urinary frequency and urgency.  OPERATION AND PROCEDURES:  Radical retropubic prostatectomy with pelvic lymph node dissection on August 22, 2000.  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 Presented with a five month history of increasing frequency and urgency October 2001.  He had a PSA of 26 at that time, biopsy in November showed 80% of the right side of his prostate had Gleason 4+4 cancer at 5% on the left and Gleason 3+4.  Bone scan, CT scan were all negative for metastatic disease and no evidence of local spread on TRUS.  Despite the low chance of cure with surgery, he enters now after auto-donating two units of blood and mechanical bowel prep for radical surgery.  He understands the risks, including but not limited to, incontinence, impotence, deep venous thrombosis, pulmonary emboli, bleeding, and death.  He also understands he may need postoperative radio therapy if his margins are positive.  He takes only Mavik 4 mg a day and ______  2.5 mg daily.  After satisfactory preop evaluation, he was taken to the operating room.  His preoperative hematocrit was low at 33%.  His white count was 9900.  His BUN was 21, creatinine 1.2.  He had surgery on the day of admission.  The pathology showed high-grade Gleason 4+5 adenocarcinoma involving both lobes. There was capsular extension extraprostatic bilaterally.  The margins showed focal right and left posterior lateral margin involvement, but the seminal vesicles and nodes were  free of tumor.  There were fossae of extracapsular extension with the focal margin involved on the right and left posterior lateral margins.  Most of the tumor was Gleason 4 with a small component of Gleason 5.  An estimated 30% of the prostate examined histologically was involved by cancer.  Postoperatively he did well.  His drainage slowed down, and his JP drain was removed on the third postoperative day.  He did not go home until the fourth postoperative day because of some gas, but he was eating a regular diet at that time.  His hematocrit was 27% immediately postop, but dropped down to 24% on the first postop day, so he got his two units of autologous blood.  After the blood, his hematocrit remained stable at 30%.  He was sent home on antibiotics and pain pills in improved ambulatory condition.  He will return to the office in a week for suture removal. DD:  09/05/00 TD:  09/05/00 Job: [id number]

## 2010-12-30 NOTE — H&P (Signed)
[facility]  Patient:    Victor Davis, Victor Davis                       MRN: [id number] Adm. 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  He presented with a five-month history of increasing frequency and urgency and, in October, had a PSA of 26.  Biopsy in November showed 80% Gleason 4+4 on the right side and 5% Gleason 3+4 on the left.  Bone scan and CT scan were all negative.  The fat tissue planes appeared to be clear and, on ultrasound, there was no evidence of any extracapsular spread.  His gland actually felt pretty normal and was normal on ultrasound, as well.  Despite of low chance of cure rate with surgery, he enters now after autodonating two units of blood and mechanical bowel prep.  He understands the risks include, but are not limited to incontinence, impotence, deep venous thrombosis, pulmonary emboli, bleeding and death.  He also understands that he may need postoperative radiation.  If the margins are positive and the nodes are negative that the surgery will not be done.  MEDICATIONS: 1. ______ 4 mg q.d. for arthritis. 2. Indapramide 2.5 mg q.d.  ALLERGIES:  None.  PAST SURGICAL HISTORY:  Rotator cuff in September 2000.  REVIEW OF SYSTEMS:  He has had no prostatitis or urinary tract infections.  he quit smoking in 1978.  He does not drink alcohol.  No GI complaints. He does have some mild low back pain, but no weight loss or fevers.  FAMILY AND SOCIAL HISTORY:  He is married.  He drives for [employer].  Both parents deceased, his father at 40 of emphysema, his mother at 32 of heart problems.  He has three brothers living and well as well as one sister.  One brother died of suicide.   He has a 75 year old son and  a daughter from a previous marriage.  No family history of cancer, diabetes or kidney stones.  PHYSICAL EXAMINATION:  GENERAL:  Short, pleasant white male.  [place]ick country accent.  Slightly balding.  Appears to be in no acute distress.  VITAL SIGNS:  Weight 150, blood pressure 153/85, pulse 65, respirations 18.  HEENT:  Clear.  His teeth are in fairly poor condition.  NECK:  Supple.  No adenopathy of the neck, inguinal areas or groin.  HEART:  Sounds are regular with no murmurs, rubs, or gallops.  LUNGS:  Sounds are clear.  ABDOMEN:  Soft and benign without masses or organomegaly.  GENITOURINARY:  Normal circumcised penis, bilaterally descended testes, 30 g prostate not fixed or i9ndurated to the pelvic sidewall.  EXTREMITIES:  No edema of the distal extremities.  Good distal pulses.  INtact sensation to light touch.  IMPRESSION: 1. BII-C, Gleason 4+4 carcinoma of the prostate, right greater than left. 2. Urgency and frequency as above. 3. Elevated PSA.  PLAN:  Recommend radical retropubic prostatectomy as discussed. DD:  08/22/00 TD:  08/22/00 Job: [id number]

## 2011-02-07 ENCOUNTER — Other Ambulatory Visit: Payer: Self-pay | Admitting: Internal Medicine

## 2011-02-07 ENCOUNTER — Other Ambulatory Visit: Payer: Medicare Other

## 2011-02-07 DIAGNOSIS — Z0000 Encounter for general adult medical examination without abnormal findings: Secondary | ICD-10-CM

## 2011-02-07 DIAGNOSIS — Z1289 Encounter for screening for malignant neoplasm of other sites: Secondary | ICD-10-CM

## 2011-02-09 ENCOUNTER — Encounter: Payer: Self-pay | Admitting: Internal Medicine

## 2011-02-09 DIAGNOSIS — Z0001 Encounter for general adult medical examination with abnormal findings: Secondary | ICD-10-CM | POA: Insufficient documentation

## 2011-02-09 DIAGNOSIS — R7302 Impaired glucose tolerance (oral): Secondary | ICD-10-CM

## 2011-02-09 HISTORY — DX: Impaired glucose tolerance (oral): R73.02

## 2011-02-10 ENCOUNTER — Other Ambulatory Visit (INDEPENDENT_AMBULATORY_CARE_PROVIDER_SITE_OTHER): Payer: Medicare Other

## 2011-02-10 ENCOUNTER — Ambulatory Visit (INDEPENDENT_AMBULATORY_CARE_PROVIDER_SITE_OTHER): Payer: Medicare Other | Admitting: Internal Medicine

## 2011-02-10 ENCOUNTER — Encounter: Payer: Self-pay | Admitting: Internal Medicine

## 2011-02-10 VITALS — BP 130/70 | HR 64 | Temp 97.9°F | Ht 68.0 in | Wt 154.0 lb

## 2011-02-10 DIAGNOSIS — E785 Hyperlipidemia, unspecified: Secondary | ICD-10-CM

## 2011-02-10 DIAGNOSIS — Z125 Encounter for screening for malignant neoplasm of prostate: Secondary | ICD-10-CM

## 2011-02-10 DIAGNOSIS — Z0000 Encounter for general adult medical examination without abnormal findings: Secondary | ICD-10-CM

## 2011-02-10 DIAGNOSIS — I10 Essential (primary) hypertension: Secondary | ICD-10-CM

## 2011-02-10 DIAGNOSIS — Z0389 Encounter for observation for other suspected diseases and conditions ruled out: Secondary | ICD-10-CM

## 2011-02-10 DIAGNOSIS — F528 Other sexual dysfunction not due to a substance or known physiological condition: Secondary | ICD-10-CM

## 2011-02-10 DIAGNOSIS — G2581 Restless legs syndrome: Secondary | ICD-10-CM

## 2011-02-10 LAB — CBC WITH DIFFERENTIAL/PLATELET
Basophils Absolute: 0 10*3/uL (ref 0.0–0.1)
HCT: 34.8 % — ABNORMAL LOW (ref 39.0–52.0)
Lymphocytes Relative: 29.2 % (ref 12.0–46.0)
Lymphs Abs: 1.9 10*3/uL (ref 0.7–4.0)
Monocytes Relative: 9.3 % (ref 3.0–12.0)
Neutrophils Relative %: 57.5 % (ref 43.0–77.0)
Platelets: 143 10*3/uL — ABNORMAL LOW (ref 150.0–400.0)
RDW: 13.2 % (ref 11.5–14.6)

## 2011-02-10 LAB — URINALYSIS
Bilirubin Urine: NEGATIVE
Ketones, ur: NEGATIVE
Leukocytes, UA: NEGATIVE
Nitrite: NEGATIVE
Specific Gravity, Urine: 1.02 (ref 1.000–1.030)
Urobilinogen, UA: 0.2 (ref 0.0–1.0)

## 2011-02-10 LAB — TSH: TSH: 0.86 u[IU]/mL (ref 0.35–5.50)

## 2011-02-10 LAB — BASIC METABOLIC PANEL
BUN: 25 mg/dL — ABNORMAL HIGH (ref 6–23)
Calcium: 9.1 mg/dL (ref 8.4–10.5)
Creatinine, Ser: 1.1 mg/dL (ref 0.4–1.5)
GFR: 70.05 mL/min (ref >60.00)
Glucose, Bld: 82 mg/dL (ref 70–99)

## 2011-02-10 LAB — PSA: PSA: 0.01 ng/mL — ABNORMAL LOW (ref 0.10–4.00)

## 2011-02-10 LAB — LDL CHOLESTEROL, DIRECT: Direct LDL: 104.9 mg/dL

## 2011-02-10 LAB — LIPID PANEL
HDL: 32 mg/dL — ABNORMAL LOW (ref >39.00)
Triglycerides: 206 mg/dL — ABNORMAL HIGH (ref 0.0–149.0)
VLDL: 41.2 mg/dL — ABNORMAL HIGH (ref 0.0–40.0)

## 2011-02-10 LAB — HEPATIC FUNCTION PANEL
Albumin: 3.7 g/dL (ref 3.5–5.2)
Total Protein: 6.1 g/dL (ref 6.0–8.3)

## 2011-02-10 MED ORDER — VARDENAFIL HCL 20 MG PO TABS: 20.0000 mg | ORAL_TABLET | ORAL | Status: DC | PRN

## 2011-02-10 MED ORDER — ROPINIROLE HCL 1 MG PO TABS: ORAL_TABLET | ORAL | Status: DC

## 2011-02-10 MED ORDER — ROPINIROLE HCL 2 MG PO TABS: ORAL_TABLET | ORAL | Status: DC

## 2011-02-10 MED ORDER — VARDENAFIL HCL 20 MG PO TABS: 20.0000 mg | ORAL_TABLET | ORAL | Status: AC | PRN

## 2011-02-10 NOTE — Progress Notes (Signed)
Subjective:    Patient ID: Victor Davis, male    DOB: 03-27-1941, 70 y.o.   MRN: [id number]  HPI  Here for wellness and f/u;  Overall doing ok;  Pt denies CP, worsening SOB, DOE, wheezing, orthopnea, PND, worsening LE edema, palpitations, dizziness or syncope.  Pt denies neurological change such as new Headache, facial or extremity weakness.  Pt denies polydipsia, polyuria, or low sugar symptoms. Pt states overall good compliance with treatment and medications, good tolerability, and trying to follow lower cholesterol diet.  Pt denies worsening depressive symptoms, suicidal ideation or panic. No fever, wt loss, night sweats, loss of appetite, or other constitutional symptoms.  Pt states good ability with ADL's, low fall risk, home safety reviewed and adequate, no significant changes in hearing or vision, and occasionally active with exercise.  BP has been labile at home, not clear if machine works well.  No new complaints today  Still sees Dr Kimbrough/urology for the prostate ca.  2 mg requip seems to be too much for the RLS symptoms, but 1 mg not enough. Past Medical History  Diagnosis Date  . BRADYCARDIA 10/24/2010  . CALF PAIN, RIGHT 10/22/2009  . 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 Dysuria 10/10/2010  . ERECTILE DYSFUNCTION 04/05/2007  . FATIGUE 10/02/2007  . GLUCOSE INTOLERANCE 07/15/2008  . HYPERLIPIDEMIA 07/30/2008  . HYPERSOMNIA 10/02/2007  . INSOMNIA-SLEEP DISORDER-UNSPEC 10/22/2009  . LOW BACK PAIN 04/05/2007  . OSTEOARTHRITIS 04/05/2007  . PEPTIC ULCER DISEASE 04/05/2007  . PERIPHERAL EDEMA 10/22/2009  . PERIPHERAL VASCULAR DISEASE 07/30/2008  . PROSTATE CANCER, HX OF 04/05/2007  . RESTLESS LEG SYNDROME 05/15/2007  . RHINITIS, ALLERGIC NOS 05/15/2007  . SHOULDER PAIN, LEFT 01/22/2008  . SINUSITIS- ACUTE-NOS 05/15/2007  . Unspecified essential hypertension 04/05/2007  . Unspecified visual loss 07/15/2008  . UTI 10/24/2010  . Impaired glucose tolerance 02/09/2011   Past Surgical History    Procedure Date  . Inguinal herniorrhapy left 2002  . Rotator cuff repair     left  . Knee surgury   . S/p ventral surgury 2009  . S/p rad protatectomy 2002  . Right hip replacement 09/22/09  . S/p left hip surgury     reports that he has quit smoking. He does not have any smokeless tobacco history on file. He reports that he drinks alcohol. He reports that he does not use illicit drugs. family history includes Hypertension in his other. Allergies  Allergen Reactions  . Amitriptyline Hcl     REACTION: confusion  . Diphenhydramine Hcl   . Simvastatin     REACTION: leg pain   Current Outpatient Prescriptions on File Prior to Visit  Medication Sig Dispense Refill  . amLODipine (NORVASC) 10 MG tablet Take 1 tablet (10 mg total) by mouth daily.  30 tablet  11  . aspirin 81 MG EC tablet Take 81 mg by mouth daily.        . cloNIDine (CATAPRES) 0.1 MG tablet Take 0.1 mg by mouth 2 (two) times daily.        . hydrochlorothiazide 25 MG tablet Take 25 mg by mouth daily.        [name] lisinopril (PRINIVIL,ZESTRIL) 40 MG tablet Take 40 mg by mouth daily.        [name] omeprazole (PRILOSEC) 40 MG capsule Take 40 mg by mouth daily.         Review of Systems Review of Systems  Constitutional: Negative for diaphoresis, activity change, appetite change and unexpected weight change.  HENT: Negative for  hearing loss, ear pain, facial swelling, mouth sores and neck stiffness.   Eyes: Negative for pain, redness and visual disturbance.  Respiratory: Negative for shortness of breath and wheezing.   Cardiovascular: Negative for chest pain and palpitations.  Gastrointestinal: Negative for diarrhea, blood in stool, abdominal distention and rectal pain.  Genitourinary: Negative for hematuria, flank pain and decreased urine volume.  Musculoskeletal: Negative for myalgias and joint swelling.  Skin: Negative for color change and wound.  Neurological: Negative for syncope and numbness.  Hematological: Negative for  adenopathy.  Psychiatric/Behavioral: Negative for hallucinations, self-injury, decreased concentration and agitation.      Objective:   Physical Exam BP 130/70  Pulse 64  Temp(Src) 97.9 F (36.6 C) (Oral)  Ht 5\' 8"  (1.727 m)  Wt 154 lb (69.854 kg)  BMI 23.42 kg/m2  SpO2 97% Physical Exam  VS noted Constitutional: Pt is oriented to person, place, and time. Appears well-developed and well-nourished.  HENT:  Head: Normocephalic and atraumatic.  Right Ear: External ear normal.  Left Ear: External ear normal.  Nose: Nose normal.  Mouth/Throat: Oropharynx is clear and moist.  Eyes: Conjunctivae and EOM are normal. Pupils are equal, round, and reactive to light.  Neck: Normal range of motion. Neck supple. No JVD present. No tracheal deviation present.  Cardiovascular: Normal rate, regular rhythm, normal heart sounds and intact distal pulses.   Pulmonary/Chest: Effort normal and breath sounds normal.  Abdominal: Soft. Bowel sounds are normal. There is no tenderness.  Musculoskeletal: Normal range of motion. Exhibits no edema.  Lymphadenopathy:  Has no cervical adenopathy.  Neurological: Pt is alert and oriented to person, place, and time. Pt has normal reflexes. No cranial nerve deficit.  Skin: Skin is warm and dry. No rash noted.  Psychiatric:  Has  normal mood and affect. Behavior is normal.         Assessment & Plan:

## 2011-02-10 NOTE — Assessment & Plan Note (Signed)
2 mg seems too sedating and 1 mg prior not working well;  Will try 1.5 mg tid instead

## 2011-02-10 NOTE — Assessment & Plan Note (Signed)
S/p leupron per Dr Kimbrough/urology. To try levitra prn

## 2011-02-10 NOTE — Patient Instructions (Addendum)
Please change the requip to 1 and 1/2 pills three times per day (of the 1 mg pills - sent to pharmacy) Start the levitra 20 mg  As needed - remember this prescription is less expensive at [facility] or [facility] Continue all other medications as before Please go to LAB in the Basement for the blood and/or urine tests to be done today Please call the phone number [phone] (the [place]) for results of testing in 2-3 days;  When calling, simply dial the number, and when prompted enter the MRN number above (the Medical Record Number) and the # key, then the message should start. Please keep your appointments with your specialists as you have planned - Dr Serita Butcher for the prostate

## 2011-02-12 ENCOUNTER — Encounter: Payer: Self-pay | Admitting: Internal Medicine

## 2011-02-12 NOTE — Assessment & Plan Note (Signed)

## 2011-02-20 ENCOUNTER — Inpatient Hospital Stay (INDEPENDENT_AMBULATORY_CARE_PROVIDER_SITE_OTHER)
Admission: RE | Admit: 2011-02-20 | Discharge: 2011-02-20 | Disposition: A | Discharge status: Home / Self Care | Payer: Medicare Other | Source: Ambulatory Visit | Attending: Family Medicine | Admitting: Family Medicine

## 2011-02-20 ENCOUNTER — Ambulatory Visit (INDEPENDENT_AMBULATORY_CARE_PROVIDER_SITE_OTHER): Payer: Medicare Other

## 2011-02-20 DIAGNOSIS — J019 Acute sinusitis, unspecified: Secondary | ICD-10-CM

## 2011-02-20 DIAGNOSIS — J40 Bronchitis, not specified as acute or chronic: Secondary | ICD-10-CM

## 2011-05-08 LAB — BASIC METABOLIC PANEL
BUN: 17
Creatinine, Ser: 1.12
GFR calc non Af Amer: >60

## 2011-05-08 LAB — DIFFERENTIAL
Eosinophils Absolute: 0.2
Lymphocytes Relative: 21
Lymphs Abs: 1.4
Neutro Abs: 4.6
Neutrophils Relative %: 69

## 2011-05-08 LAB — CBC
Platelets: 176
WBC: 6.7

## 2011-05-08 LAB — URINALYSIS, MICROSCOPIC ONLY
Bilirubin Urine: NEGATIVE
Nitrite: NEGATIVE
Specific Gravity, Urine: 1.018
Urobilinogen, UA: 0.2

## 2011-05-08 LAB — PROTIME-INR
INR: 0.9
Prothrombin Time: 12.4

## 2011-08-14 ENCOUNTER — Ambulatory Visit (INDEPENDENT_AMBULATORY_CARE_PROVIDER_SITE_OTHER): Payer: Medicare Other | Admitting: Internal Medicine

## 2011-08-14 ENCOUNTER — Encounter: Payer: Self-pay | Admitting: Internal Medicine

## 2011-08-14 VITALS — BP 110/70 | HR 65 | Temp 97.6°F | Ht 69.0 in | Wt 157.4 lb

## 2011-08-14 DIAGNOSIS — R7302 Impaired glucose tolerance (oral): Secondary | ICD-10-CM

## 2011-08-14 DIAGNOSIS — E785 Hyperlipidemia, unspecified: Secondary | ICD-10-CM

## 2011-08-14 DIAGNOSIS — Z23 Encounter for immunization: Secondary | ICD-10-CM

## 2011-08-14 DIAGNOSIS — R7309 Other abnormal glucose: Secondary | ICD-10-CM

## 2011-08-14 DIAGNOSIS — Z0000 Encounter for general adult medical examination without abnormal findings: Secondary | ICD-10-CM

## 2011-08-14 DIAGNOSIS — I10 Essential (primary) hypertension: Secondary | ICD-10-CM

## 2011-08-14 NOTE — Patient Instructions (Signed)
Continue all other medications as before Your forms were filled in today Please return in 6 mo with Lab testing done 3-5 days before

## 2011-08-15 ENCOUNTER — Encounter: Payer: Self-pay | Admitting: Internal Medicine

## 2011-08-15 NOTE — Progress Notes (Signed)
Subjective:    Patient ID: Victor Davis, male    DOB: [birth year], 71 y.o.   MRN: [id number]  HPI  Here to f/u; overall doing ok,  Pt denies chest pain, increased sob or doe, wheezing, orthopnea, PND, increased LE swelling, palpitations, dizziness or syncope.  Pt denies new neurological symptoms such as new headache, or facial or extremity weakness or numbness   Pt denies polydipsia, polyuria, or low sugar symptoms such as weakness or confusion improved with po intake.  Pt states overall good compliance with meds, trying to follow lower cholesterol, diabetic diet, wt overall stable but little exercise however.  Needs form filled out for wellness for insurance. No new complaints today Past Medical History  Diagnosis Date  . BRADYCARDIA 10/24/2010  . CALF PAIN, RIGHT 10/22/2009  . 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 Dysuria 10/10/2010  . ERECTILE DYSFUNCTION 04/05/2007  . FATIGUE 10/02/2007  . GLUCOSE INTOLERANCE 07/15/2008  . HYPERLIPIDEMIA 07/30/2008  . HYPERSOMNIA 10/02/2007  . INSOMNIA-SLEEP DISORDER-UNSPEC 10/22/2009  . LOW BACK PAIN 04/05/2007  . OSTEOARTHRITIS 04/05/2007  . PEPTIC ULCER DISEASE 04/05/2007  . PERIPHERAL EDEMA 10/22/2009  . PERIPHERAL VASCULAR DISEASE 07/30/2008  . PROSTATE CANCER, HX OF 04/05/2007  . RESTLESS LEG SYNDROME 05/15/2007  . RHINITIS, ALLERGIC NOS 05/15/2007  . SHOULDER PAIN, LEFT 01/22/2008  . SINUSITIS- ACUTE-NOS 05/15/2007  . Unspecified essential hypertension 04/05/2007  . Unspecified visual loss 07/15/2008  . UTI 10/24/2010  . Impaired glucose tolerance 02/09/2011   Past Surgical History  Procedure Date  . Inguinal herniorrhapy left 2002  . Rotator cuff repair     left  . Knee surgury   . S/p ventral surgury 2009  . S/p rad protatectomy 2002  . Right hip replacement 09/22/09  . S/p left hip surgury     reports that he has quit smoking. He does not have any smokeless tobacco history on file. He reports that he drinks alcohol. He reports that he does not use  illicit drugs. family history includes Hypertension in his other. Allergies  Allergen Reactions  . Amitriptyline Hcl     REACTION: confusion  . Diphenhydramine Hcl   . Simvastatin     REACTION: leg pain   Current Outpatient Prescriptions on File Prior to Visit  Medication Sig Dispense Refill  . amLODipine (NORVASC) 10 MG tablet Take 1 tablet (10 mg total) by mouth daily.  30 tablet  11  . aspirin 81 MG EC tablet Take 81 mg by mouth daily.        . cloNIDine (CATAPRES) 0.1 MG tablet Take 0.1 mg by mouth 2 (two) times daily.        . hydrochlorothiazide 25 MG tablet Take 25 mg by mouth daily.        [name] lisinopril (PRINIVIL,ZESTRIL) 40 MG tablet Take 40 mg by mouth daily.        [name] omeprazole (PRILOSEC) 40 MG capsule Take 40 mg by mouth daily.        [name] rOPINIRole (REQUIP) 1 MG tablet Take one and 1/2 pills three times per day  135 tablet  11   Review of Systems Review of Systems  Constitutional: Negative for diaphoresis and unexpected weight change.  HENT: Negative for drooling and tinnitus.   Eyes: Negative for photophobia and visual disturbance.  Respiratory: Negative for choking and stridor.   Gastrointestinal: Negative for vomiting and blood in stool.  Genitourinary: Negative for hematuria and decreased urine volume.     Objective:   Physical Exam BP 110/70  Pulse 65  Temp(Src) 97.6 F (36.4 C) (Oral)  Ht 5\' 9"  (1.753 m)  Wt 157 lb 6 oz (71.385 kg)  BMI 23.24 kg/m2  SpO2 98% Physical Exam  VS noted Constitutional: Pt appears well-developed and well-nourished.  HENT: Head: Normocephalic.  Right Ear: External ear normal.  Left Ear: External ear normal.  Eyes: Conjunctivae and EOM are normal. Pupils are equal, round, and reactive to light.  Neck: Normal range of motion. Neck supple.  Cardiovascular: Normal rate and regular rhythm.   Pulmonary/Chest: Effort normal and breath sounds normal.  Abd:  Soft, NT, non-distended, + BS Neurological: Pt is alert. No cranial nerve  deficit.  Skin: Skin is warm. No erythema.  Psychiatric: Pt behavior is normal. Thought content normal.     Assessment & Plan:

## 2011-08-15 NOTE — Assessment & Plan Note (Signed)
stable overall by hx and exam, most recent data reviewed with pt, and pt to continue medical treatment as before Lab Results  Component Value Date   LDLCALC 96 02/01/2010

## 2011-08-15 NOTE — Assessment & Plan Note (Signed)
stable overall by hx and exam, most recent data reviewed with pt, and pt to continue medical treatment as before,  Lab Results  Component Value Date   HGBA1C 5.3 02/01/2010

## 2011-08-15 NOTE — Assessment & Plan Note (Signed)
stable overall by hx and exam, most recent data reviewed with pt, and pt to continue medical treatment as before BP Readings from Last 3 Encounters:  08/14/11 110/70  02/10/11 130/70  10/24/10 112/62

## 2011-09-01 ENCOUNTER — Other Ambulatory Visit: Payer: Self-pay | Admitting: Internal Medicine

## 2011-11-01 ENCOUNTER — Other Ambulatory Visit: Payer: Self-pay | Admitting: Internal Medicine

## 2011-11-29 ENCOUNTER — Ambulatory Visit: Payer: Self-pay | Admitting: Ophthalmology

## 2011-12-06 ENCOUNTER — Ambulatory Visit: Payer: Self-pay | Admitting: Ophthalmology

## 2011-12-07 ENCOUNTER — Other Ambulatory Visit: Payer: Self-pay | Admitting: Internal Medicine

## 2011-12-08 ENCOUNTER — Other Ambulatory Visit: Payer: Self-pay

## 2011-12-08 MED ORDER — CLONIDINE HCL 0.1 MG PO TABS: 0.1000 mg | ORAL_TABLET | Freq: Two times a day (BID) | ORAL | Status: DC

## 2011-12-18 ENCOUNTER — Encounter: Payer: Self-pay | Admitting: Endocrinology

## 2011-12-18 ENCOUNTER — Other Ambulatory Visit (INDEPENDENT_AMBULATORY_CARE_PROVIDER_SITE_OTHER): Payer: Medicare Other

## 2011-12-18 ENCOUNTER — Ambulatory Visit (INDEPENDENT_AMBULATORY_CARE_PROVIDER_SITE_OTHER)
Admission: RE | Admit: 2011-12-18 | Discharge: 2011-12-18 | Disposition: A | Discharge status: Home / Self Care | Payer: Medicare Other | Source: Ambulatory Visit | Attending: Endocrinology | Admitting: Endocrinology

## 2011-12-18 ENCOUNTER — Ambulatory Visit (INDEPENDENT_AMBULATORY_CARE_PROVIDER_SITE_OTHER): Payer: Medicare Other | Admitting: Endocrinology

## 2011-12-18 ENCOUNTER — Encounter: Payer: Self-pay | Admitting: Internal Medicine

## 2011-12-18 VITALS — BP 152/76 | HR 91 | Temp 98.5°F | Wt 152.4 lb

## 2011-12-18 DIAGNOSIS — E0590 Thyrotoxicosis, unspecified without thyrotoxic crisis or storm: Secondary | ICD-10-CM | POA: Insufficient documentation

## 2011-12-18 DIAGNOSIS — R05 Cough: Secondary | ICD-10-CM

## 2011-12-18 DIAGNOSIS — K769 Liver disease, unspecified: Secondary | ICD-10-CM

## 2011-12-18 DIAGNOSIS — E785 Hyperlipidemia, unspecified: Secondary | ICD-10-CM

## 2011-12-18 DIAGNOSIS — R1110 Vomiting, unspecified: Secondary | ICD-10-CM

## 2011-12-18 DIAGNOSIS — Z8546 Personal history of malignant neoplasm of prostate: Secondary | ICD-10-CM

## 2011-12-18 DIAGNOSIS — R7302 Impaired glucose tolerance (oral): Secondary | ICD-10-CM

## 2011-12-18 DIAGNOSIS — Z0000 Encounter for general adult medical examination without abnormal findings: Secondary | ICD-10-CM

## 2011-12-18 DIAGNOSIS — R918 Other nonspecific abnormal finding of lung field: Secondary | ICD-10-CM

## 2011-12-18 DIAGNOSIS — R7309 Other abnormal glucose: Secondary | ICD-10-CM

## 2011-12-18 LAB — HEPATIC FUNCTION PANEL
Alkaline Phosphatase: 223 U/L — ABNORMAL HIGH (ref 39–117)
Bilirubin, Direct: 0.2 mg/dL (ref 0.0–0.3)
Total Protein: 5.9 g/dL — ABNORMAL LOW (ref 6.0–8.3)

## 2011-12-18 LAB — CBC WITH DIFFERENTIAL/PLATELET
Basophils Absolute: 0.1 10*3/uL (ref 0.0–0.1)
Eosinophils Absolute: 0.1 10*3/uL (ref 0.0–0.7)
HCT: 32.3 % — ABNORMAL LOW (ref 39.0–52.0)
Hemoglobin: 10.8 g/dL — ABNORMAL LOW (ref 13.0–17.0)
Lymphs Abs: 0.7 10*3/uL (ref 0.7–4.0)
MCHC: 33.4 g/dL (ref 30.0–36.0)
MCV: 88.2 fl (ref 78.0–100.0)
Monocytes Absolute: 1 10*3/uL (ref 0.1–1.0)
Neutro Abs: 10.5 10*3/uL — ABNORMAL HIGH (ref 1.4–7.7)
RDW: 12.1 % (ref 11.5–14.6)

## 2011-12-18 LAB — PSA: PSA: 2.52 ng/mL (ref 0.10–4.00)

## 2011-12-18 LAB — URINALYSIS, ROUTINE W REFLEX MICROSCOPIC
Hgb urine dipstick: NEGATIVE
Ketones, ur: NEGATIVE
Urine Glucose: NEGATIVE
Urobilinogen, UA: 2 (ref 0.0–1.0)

## 2011-12-18 LAB — BASIC METABOLIC PANEL
CO2: 26 mEq/L (ref 19–32)
Calcium: 8.8 mg/dL (ref 8.4–10.5)
GFR: 53.79 mL/min — ABNORMAL LOW (ref >60.00)
Sodium: 144 mEq/L (ref 135–145)

## 2011-12-18 LAB — LIPID PANEL
HDL: 28 mg/dL — ABNORMAL LOW (ref >39.00)
Total CHOL/HDL Ratio: 4

## 2011-12-18 MED ORDER — PROMETHAZINE-CODEINE 6.25-10 MG/5ML PO SYRP: 5.0000 mL | ORAL_SOLUTION | ORAL | Status: AC | PRN

## 2011-12-18 MED ORDER — CEFUROXIME AXETIL 250 MG PO TABS: 250.0000 mg | ORAL_TABLET | Freq: Two times a day (BID) | ORAL | Status: AC

## 2011-12-18 NOTE — Patient Instructions (Addendum)
blood tests are being requested for you today.  You will receive a letter with results. Here are 2 prescriptions: cough syrup and antibiotic. I hope you feel better soon.  If you don't feel better by next week, please call back. Call sooner if you feel worse. (update:  i called wife:  Check 3 imaging studies, then f/u with both dr Jenny Reichmann and i).

## 2011-12-18 NOTE — Progress Notes (Signed)
Subjective:    Patient ID: Victor Davis, male    DOB: 1940-12-02, 71 y.o.   MRN: [id number]  HPI Pt states 1 week of moderate prod-quality cough in the chest, and assoc n/v.   Past Medical History  Diagnosis Date  . BRADYCARDIA 10/24/2010  . CALF PAIN, RIGHT 10/22/2009  . 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 Dysuria 10/10/2010  . ERECTILE DYSFUNCTION 04/05/2007  . FATIGUE 10/02/2007  . GLUCOSE INTOLERANCE 07/15/2008  . HYPERLIPIDEMIA 07/30/2008  . HYPERSOMNIA 10/02/2007  . INSOMNIA-SLEEP DISORDER-UNSPEC 10/22/2009  . LOW BACK PAIN 04/05/2007  . OSTEOARTHRITIS 04/05/2007  . PEPTIC ULCER DISEASE 04/05/2007  . PERIPHERAL EDEMA 10/22/2009  . PERIPHERAL VASCULAR DISEASE 07/30/2008  . PROSTATE CANCER, HX OF 04/05/2007  . RESTLESS LEG SYNDROME 05/15/2007  . RHINITIS, ALLERGIC NOS 05/15/2007  . SHOULDER PAIN, LEFT 01/22/2008  . SINUSITIS- ACUTE-NOS 05/15/2007  . Unspecified essential hypertension 04/05/2007  . Unspecified visual loss 07/15/2008  . UTI 10/24/2010  . Impaired glucose tolerance 02/09/2011    Past Surgical History  Procedure Date  . Inguinal herniorrhapy left 2002  . Rotator cuff repair     left  . Knee surgury   . S/p ventral surgury 2009  . S/p rad protatectomy 2002  . Right hip replacement 09/22/09  . S/p left hip surgury     History   Social History  . Marital Status: Married    Spouse Name: N/A    Number of Children: 2  . Years of Education: N/A   Occupational History  . truck driver    Social History Main Topics  . Smoking status: Former Research scientist (life sciences)  . Smokeless tobacco: Not on file  . Alcohol Use: Yes  . Drug Use: No  . Sexually Active: Not on file   Other Topics Concern  . Not on file   Social History Narrative  . No narrative on file    Current Outpatient Prescriptions on File Prior to Visit  Medication Sig Dispense Refill  . amLODipine (NORVASC) 10 MG tablet TAKE 1 TABLET EVERY DAY  30 tablet  8  . aspirin 81 MG EC tablet Take 81 mg by mouth daily.         . cloNIDine (CATAPRES) 0.1 MG tablet Take 1 tablet (0.1 mg total) by mouth 2 (two) times daily.  60 tablet  8  . hydrochlorothiazide 25 MG tablet Take 25 mg by mouth daily.        [name] lisinopril (PRINIVIL,ZESTRIL) 40 MG tablet TAKE 1 TABLET BY MOUTH ONCE DAILY  90 tablet  3  . omeprazole (PRILOSEC) 40 MG capsule TAKE 1 CAPSULE EVERY DAY  90 capsule  3  . rOPINIRole (REQUIP) 1 MG tablet Take one and 1/2 pills three times per day  135 tablet  11    Allergies  Allergen Reactions  . Amitriptyline Hcl     REACTION: confusion  . Diphenhydramine Hcl   . Simvastatin     REACTION: leg pain    Family History  Problem Relation Age of Onset  . Hypertension Other    BP 152/76  Pulse 91  Temp(Src) 98.5 F (36.9 C) (Oral)  Wt 152 lb 6.4 oz (69.128 kg)  SpO2 94%  Review of Systems He attributes neck pain to having to keep his head on a table after eye surgery.  He also has headache.    Objective:   Physical Exam VITAL SIGNS:  See vs page GENERAL: no distress head: no deformity eyes: no periorbital swelling, no proptosis external  nose and ears are normal mouth: no lesion seen Both tm's are red Neck: full rom, but rom is painful LUNGS:  Clear to auscultation ABDOMEN: abdomen is soft, nontender.  no hepatosplenomegaly.   not distended.  no hernia.     Lab Results  Component Value Date   WBC 12.4* 12/18/2011   HGB 10.8* 12/18/2011   HCT 32.3* 12/18/2011   PLT 206.0 12/18/2011   GLUCOSE 100* 12/18/2011   CHOL 114 12/18/2011   TRIG 84.0 12/18/2011   HDL 28.00* 12/18/2011   LDLDIRECT 104.9 02/10/2011   LDLCALC 69 12/18/2011   ALT 23 12/18/2011   AST 19 12/18/2011   NA 144 12/18/2011   K 4.3 12/18/2011   CL 107 12/18/2011   CREATININE 1.4 12/18/2011   BUN 17 12/18/2011   CO2 26 12/18/2011   TSH 0.04* 12/18/2011   PSA 2.52 12/18/2011   INR 1.24 12/02/2009   HGBA1C 5.8 12/18/2011   MICROALBUR 3.6* 02/01/2010      Assessment & Plan:  Acute bronchitis, new Hyperthyroidism, new abnl alk phos, uncertain  etiology, new

## 2011-12-22 ENCOUNTER — Ambulatory Visit (HOSPITAL_COMMUNITY): Admission: RE | Admit: 2011-12-22 | Payer: Medicare Other | Source: Ambulatory Visit

## 2011-12-27 ENCOUNTER — Ambulatory Visit (HOSPITAL_COMMUNITY): Payer: Medicare Other

## 2011-12-27 ENCOUNTER — Other Ambulatory Visit (HOSPITAL_COMMUNITY): Payer: Medicare Other

## 2011-12-28 ENCOUNTER — Other Ambulatory Visit (HOSPITAL_COMMUNITY): Payer: Medicare Other

## 2011-12-29 ENCOUNTER — Encounter: Payer: Self-pay | Admitting: Endocrinology

## 2011-12-29 ENCOUNTER — Ambulatory Visit (INDEPENDENT_AMBULATORY_CARE_PROVIDER_SITE_OTHER)
Admission: RE | Admit: 2011-12-29 | Discharge: 2011-12-29 | Disposition: A | Discharge status: Home / Self Care | Payer: Medicare Other | Source: Ambulatory Visit | Attending: Endocrinology | Admitting: Endocrinology

## 2011-12-29 DIAGNOSIS — R918 Other nonspecific abnormal finding of lung field: Secondary | ICD-10-CM

## 2011-12-29 MED ORDER — IOHEXOL 300 MG/ML  SOLN
80.0000 mL | Freq: Once | INTRAMUSCULAR | Status: AC | PRN
  Administered 2011-12-29: 80 mL via INTRAVENOUS

## 2012-01-02 ENCOUNTER — Telehealth: Payer: Self-pay | Admitting: *Deleted

## 2012-01-02 NOTE — Telephone Encounter (Signed)
Called pt to inform of CT results, pt informed (letter also mailed to pt).

## 2012-01-04 ENCOUNTER — Telehealth: Payer: Self-pay | Admitting: *Deleted

## 2012-01-04 DIAGNOSIS — I2510 Atherosclerotic heart disease of native coronary artery without angina pectoris: Secondary | ICD-10-CM | POA: Insufficient documentation

## 2012-01-04 NOTE — Telephone Encounter (Signed)
x

## 2012-01-04 NOTE — Telephone Encounter (Signed)
Pt's wife called regarding CT results. They received letter but they want to know what findings mean. Callback number is [phone]

## 2012-01-04 NOTE — Telephone Encounter (Signed)
i called and discussed with wife.  She requests ref cardiol.  done

## 2012-01-19 ENCOUNTER — Ambulatory Visit
Admission: RE | Admit: 2012-01-19 | Discharge: 2012-01-19 | Disposition: A | Discharge status: Home / Self Care | Payer: Medicare Other | Source: Ambulatory Visit | Attending: Endocrinology | Admitting: Endocrinology

## 2012-01-19 ENCOUNTER — Other Ambulatory Visit: Payer: Medicare Other

## 2012-01-19 DIAGNOSIS — K769 Liver disease, unspecified: Secondary | ICD-10-CM

## 2012-02-06 ENCOUNTER — Ambulatory Visit (INDEPENDENT_AMBULATORY_CARE_PROVIDER_SITE_OTHER): Payer: Medicare Other | Admitting: Cardiovascular Disease

## 2012-02-06 ENCOUNTER — Encounter: Payer: Self-pay | Admitting: Cardiovascular Disease

## 2012-02-06 VITALS — BP 188/97 | HR 63 | Resp 12 | Ht 68.0 in | Wt 150.0 lb

## 2012-02-06 DIAGNOSIS — I10 Essential (primary) hypertension: Secondary | ICD-10-CM

## 2012-02-06 DIAGNOSIS — I340 Nonrheumatic mitral (valve) insufficiency: Secondary | ICD-10-CM

## 2012-02-06 DIAGNOSIS — I059 Rheumatic mitral valve disease, unspecified: Secondary | ICD-10-CM

## 2012-02-06 DIAGNOSIS — I2510 Atherosclerotic heart disease of native coronary artery without angina pectoris: Secondary | ICD-10-CM

## 2012-02-06 MED ORDER — CLONIDINE HCL 0.2 MG PO TABS: 0.2000 mg | ORAL_TABLET | Freq: Two times a day (BID) | ORAL | Status: DC

## 2012-02-06 NOTE — Assessment & Plan Note (Signed)
He has evidence of coronary artery calcification on CT scan chest in May 2013. He does describe chest pains and fatigue. His other risk factors for obstructive CAD include HTN, HLD and glucose intolerance. I will arrange an exercise stress myoview to exclude ischemia. I will also arrange an echo to assess LV size and function with LVH on EKG.

## 2012-02-06 NOTE — Progress Notes (Signed)
History of Present Illness: 71 yo white male with history of CVA, HTN, HLD, PAD, prostate cancer who is here today as a new patient for evaluation of abnormal chest CT. His chest CT showed calcification of the aorta and coronary arteries. He describes occasional chest pains that occur at rest and with exertion. No SOB. He describes being unsteady on his feet. Also describes fatigue and being angry often. This seems to affect his blood pressure. Echo 2009 with normal LV function, mild MR. He reports poor control of BP lately despite medical therapy.    Primary Care Physician: Cathlean Cower  Past Medical History  Diagnosis Date  . BRADYCARDIA 10/24/2010  . CALF PAIN, RIGHT 10/22/2009  . 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 Dysuria 10/10/2010  . ERECTILE DYSFUNCTION 04/05/2007  . FATIGUE 10/02/2007  . GLUCOSE INTOLERANCE 07/15/2008  . HYPERLIPIDEMIA 07/30/2008  . HYPERSOMNIA 10/02/2007  . INSOMNIA-SLEEP DISORDER-UNSPEC 10/22/2009  . LOW BACK PAIN 04/05/2007  . OSTEOARTHRITIS 04/05/2007  . PEPTIC ULCER DISEASE 04/05/2007  . PERIPHERAL EDEMA 10/22/2009  . PERIPHERAL VASCULAR DISEASE 07/30/2008  . PROSTATE CANCER, HX OF 04/05/2007  . RESTLESS LEG SYNDROME 05/15/2007  . RHINITIS, ALLERGIC NOS 05/15/2007  . SHOULDER PAIN, LEFT 01/22/2008  . SINUSITIS- ACUTE-NOS 05/15/2007  . Unspecified essential hypertension 04/05/2007  . Unspecified visual loss 07/15/2008  . UTI 10/24/2010  . Impaired glucose tolerance 02/09/2011    Past Surgical History  Procedure Date  . Inguinal herniorrhapy left 2002  . Rotator cuff repair     left  . Knee surgery   . S/p ventral surgery 2009  . S/p rad protatectomy 2002  . Right hip replacement 09/22/09  . Left hip replacement   . Eye surgery     Current Outpatient Prescriptions  Medication Sig Dispense Refill  . amLODipine (NORVASC) 10 MG tablet TAKE 1 TABLET EVERY DAY  30 tablet  8  . aspirin 81 MG EC tablet Take 81 mg by mouth daily.        . cloNIDine (CATAPRES) 0.1  MG tablet Take 1 tablet (0.1 mg total) by mouth 2 (two) times daily.  60 tablet  8  . diclofenac (VOLTAREN) 75 MG EC tablet DAILY      . lisinopril (PRINIVIL,ZESTRIL) 40 MG tablet TAKE 1 TABLET BY MOUTH ONCE DAILY  90 tablet  3  . omeprazole (PRILOSEC) 40 MG capsule TAKE 1 CAPSULE EVERY DAY  90 capsule  3  . rOPINIRole (REQUIP) 1 MG tablet Take one and 1/2 pills three times per day  135 tablet  11    Allergies  Allergen Reactions  . Amitriptyline Hcl     REACTION: confusion  . Diphenhydramine Hcl   . Simvastatin     REACTION: leg pain    History   Social History  . Marital Status: Married    Spouse Name: N/A    Number of Children: 2  . Years of Education: N/A   Occupational History  . truck driver-retired    Social History Main Topics  . Smoking status: Former Smoker -- 1.0 packs/day for 15 years    Types: Cigarettes    Quit date: 02/05/1977  . Smokeless tobacco: Not on file  . Alcohol Use: 0.5 oz/week    1 drink(s) per week  . Drug Use: No  . Sexually Active: Not on file   Other Topics Concern  . Not on file   Social History Narrative  . No narrative on file    Family History  Problem Relation  Age of Onset  . Heart attack Father 25    Smoker  . Heart attack Brother 70    Review of Systems:  As stated in the HPI and otherwise negative.   BP 188/97  Ht 5\' 8"  (1.727 m)  Wt 150 lb (68.04 kg)  BMI 22.81 kg/m2  Physical Examination: General: Well developed, well nourished, NAD HEENT: OP clear, mucus membranes moist SKIN: warm, dry. No rashes. Neuro: No focal deficits Musculoskeletal: Muscle strength 5/5 all ext Psychiatric: Mood and affect normal Neck: No JVD, no carotid bruits, no thyromegaly, no lymphadenopathy. Lungs:Clear bilaterally, no wheezes, rhonci, crackles Cardiovascular: Regular rate and rhythm. Slight systolic murmur. No gallops or rubs. Abdomen:Soft. Bowel sounds present. Non-tender.  Extremities: No lower extremity edema. Pulses are 2 +  in the bilateral DP/PT.  EKG: NSR, rate 63 bpm. LVH ? Old septal infarct. Non-specific T wave abnormalities.   Chest CT 12/29/11: 1. Ground-glass nodule in the right upper lobe measures 0.8 cm. Initial follow-up of this nodule in 3 months is advised to confirm persistence. 2. Borderline mediastinal lymph nodes. Attention on follow-up exam advised. 3. There is atherosclerosis of the thoracic aorta, the great vessels of the mediastinum and the coronary arteries, including calcified atherosclerotic plaque in the LAD and left circumflex coronary arteries.

## 2012-02-06 NOTE — Patient Instructions (Signed)
Your physician recommends that you schedule a follow-up appointment in: 5-6 weeks.   Your physician has requested that you have an exercise stress myoview. For further information please visit [email or web address]. Please follow instruction sheet, as given.   Your physician has requested that you have an echocardiogram. Echocardiography is a painless test that uses sound waves to create images of your heart. It provides your doctor with information about the size and shape of your heart and how well your heart's chambers and valves are working. This procedure takes approximately one hour. There are no restrictions for this procedure.   Your physician has recommended you make the following change in your medication: Increase clonidine to 0.2 mg by mouth twice daily

## 2012-02-06 NOTE — Assessment & Plan Note (Signed)
Mild MR by echo 2009. Will assess with repeat echo.

## 2012-02-06 NOTE — Assessment & Plan Note (Signed)
BP is still elevated on 3 drug therapy. Will continue Norvasc and Lisinopril. Will increase Clonidine to 0.2 mg po BID. If he continues to have issues with BP control, will arrange renal artery doppler at f/u visit.

## 2012-02-07 ENCOUNTER — Ambulatory Visit (INDEPENDENT_AMBULATORY_CARE_PROVIDER_SITE_OTHER): Payer: Medicare Other | Admitting: Physician Assistant

## 2012-02-07 ENCOUNTER — Encounter: Payer: Self-pay | Admitting: *Deleted

## 2012-02-07 ENCOUNTER — Ambulatory Visit (HOSPITAL_COMMUNITY): Payer: Medicare Other | Attending: Internal Medicine | Admitting: Radiology

## 2012-02-07 ENCOUNTER — Encounter: Payer: Self-pay | Admitting: Physician Assistant

## 2012-02-07 VITALS — BP 122/70 | HR 42 | Ht 68.0 in | Wt 148.0 lb

## 2012-02-07 VITALS — Ht 68.0 in | Wt 148.0 lb

## 2012-02-07 DIAGNOSIS — I059 Rheumatic mitral valve disease, unspecified: Secondary | ICD-10-CM

## 2012-02-07 DIAGNOSIS — I10 Essential (primary) hypertension: Secondary | ICD-10-CM | POA: Insufficient documentation

## 2012-02-07 DIAGNOSIS — Z87891 Personal history of nicotine dependence: Secondary | ICD-10-CM | POA: Insufficient documentation

## 2012-02-07 DIAGNOSIS — R079 Chest pain, unspecified: Secondary | ICD-10-CM | POA: Insufficient documentation

## 2012-02-07 DIAGNOSIS — R5381 Other malaise: Secondary | ICD-10-CM | POA: Insufficient documentation

## 2012-02-07 DIAGNOSIS — R110 Nausea: Secondary | ICD-10-CM | POA: Insufficient documentation

## 2012-02-07 DIAGNOSIS — E785 Hyperlipidemia, unspecified: Secondary | ICD-10-CM | POA: Insufficient documentation

## 2012-02-07 DIAGNOSIS — I2510 Atherosclerotic heart disease of native coronary artery without angina pectoris: Secondary | ICD-10-CM

## 2012-02-07 DIAGNOSIS — Z8249 Family history of ischemic heart disease and other diseases of the circulatory system: Secondary | ICD-10-CM | POA: Insufficient documentation

## 2012-02-07 DIAGNOSIS — R0609 Other forms of dyspnea: Secondary | ICD-10-CM | POA: Insufficient documentation

## 2012-02-07 DIAGNOSIS — R0602 Shortness of breath: Secondary | ICD-10-CM

## 2012-02-07 DIAGNOSIS — R9439 Abnormal result of other cardiovascular function study: Secondary | ICD-10-CM

## 2012-02-07 DIAGNOSIS — R0989 Other specified symptoms and signs involving the circulatory and respiratory systems: Secondary | ICD-10-CM | POA: Insufficient documentation

## 2012-02-07 DIAGNOSIS — I340 Nonrheumatic mitral (valve) insufficiency: Secondary | ICD-10-CM

## 2012-02-07 DIAGNOSIS — Z8673 Personal history of transient ischemic attack (TIA), and cerebral infarction without residual deficits: Secondary | ICD-10-CM | POA: Insufficient documentation

## 2012-02-07 DIAGNOSIS — R5383 Other fatigue: Secondary | ICD-10-CM | POA: Insufficient documentation

## 2012-02-07 DIAGNOSIS — I739 Peripheral vascular disease, unspecified: Secondary | ICD-10-CM | POA: Insufficient documentation

## 2012-02-07 LAB — BASIC METABOLIC PANEL
BUN: 19 mg/dL (ref 6–23)
Calcium: 9.4 mg/dL (ref 8.4–10.5)
Creatinine, Ser: 1.3 mg/dL (ref 0.4–1.5)
GFR: 60.27 mL/min (ref >60.00)
Glucose, Bld: 97 mg/dL (ref 70–99)
Potassium: 4.6 mEq/L (ref 3.5–5.1)

## 2012-02-07 LAB — PROTIME-INR
INR: 0.9 ratio (ref 0.8–1.0)
Prothrombin Time: 9.6 s — ABNORMAL LOW (ref 10.2–12.4)

## 2012-02-07 LAB — CBC WITH DIFFERENTIAL/PLATELET
Basophils Absolute: 0 10*3/uL (ref 0.0–0.1)
Eosinophils Relative: 3.3 % (ref 0.0–5.0)
Lymphocytes Relative: 24.3 % (ref 12.0–46.0)
Lymphs Abs: 1.6 10*3/uL (ref 0.7–4.0)
Monocytes Relative: 7.1 % (ref 3.0–12.0)
Neutrophils Relative %: 64.7 % (ref 43.0–77.0)
Platelets: 152 10*3/uL (ref 150.0–400.0)
RDW: 15.1 % — ABNORMAL HIGH (ref 11.5–14.6)
WBC: 6.7 10*3/uL (ref 4.5–10.5)

## 2012-02-07 MED ORDER — TECHNETIUM TC 99M TETROFOSMIN IV KIT
30.0000 | PACK | Freq: Once | INTRAVENOUS | Status: AC | PRN
  Administered 2012-02-07: 30 via INTRAVENOUS

## 2012-02-07 MED ORDER — REGADENOSON 0.4 MG/5ML IV SOLN
0.4000 mg | Freq: Once | INTRAVENOUS | Status: AC
  Administered 2012-02-07: 0.4 mg via INTRAVENOUS

## 2012-02-07 MED ORDER — TECHNETIUM TC 99M TETROFOSMIN IV KIT
10.0000 | PACK | Freq: Once | INTRAVENOUS | Status: AC | PRN
  Administered 2012-02-07: 10 via INTRAVENOUS

## 2012-02-07 MED ORDER — NITROGLYCERIN 0.4 MG SL SUBL: 0.4000 mg | SUBLINGUAL_TABLET | SUBLINGUAL | Status: DC | PRN

## 2012-02-07 MED ORDER — ISOSORBIDE MONONITRATE ER 30 MG PO TB24: 30.0000 mg | ORAL_TABLET | Freq: Every day | ORAL | Status: DC

## 2012-02-07 NOTE — Assessment & Plan Note (Signed)
stable °

## 2012-02-07 NOTE — H&P (Signed)
HPI: 71 yo white male with history of CVA, HTN, HLD, PAD, prostate cancer who saw Dr. Angelena Form yesterday as a new patient for evaluation of abnormal chest CT. His chest CT showed calcification of the aorta and coronary arteries. He describes occasional chest pains that occur at rest and with exertion. No SOB. He describes being unsteady on his feet. Also describes fatigue and being angry often. This seems to affect his blood pressure. Echo 2009 with normal LV function, mild MR. He reports poor control of BP lately despite medical therapy.   Stress Myoview was performed in the office today. Resting EKG showed marked sinus bradycardia of 42 with LVH and inferior lateral ST-T wave inversion patient had marked ST depression with stress but this could be due to LVH. He does have a small apical defect on perfusion study and is having exertional chest pain. Because of this cardiac catheterization has been recommended by Dr. Lia Foyer. The patient will have this done July 1 by Dr. Angelena Form.       Allergies   Allergen  Reactions   .  Amitriptyline Hcl         REACTION: confusion   .  Diphenhydramine Hcl     .  Simvastatin         REACTION: leg pain       Current Outpatient Prescriptions on File Prior to Visit   Medication  Sig  Dispense  Refill   .  amLODipine (NORVASC) 10 MG tablet  TAKE 1 TABLET EVERY DAY   30 tablet   8   .  aspirin 81 MG EC tablet  Take 81 mg by mouth daily.           .  cloNIDine (CATAPRES) 0.2 MG tablet  Take 1 tablet (0.2 mg total) by mouth 2 (two) times daily.   60 tablet   6   .  isosorbide mononitrate (IMDUR) 30 MG 24 hr tablet  Take 1 tablet (30 mg total) by mouth daily.   30 tablet   6   .  lisinopril (PRINIVIL,ZESTRIL) 40 MG tablet  TAKE 1 TABLET BY MOUTH ONCE DAILY   90 tablet   3   .  nitroGLYCERIN (NITROSTAT) 0.4 MG SL tablet  Place 1 tablet (0.4 mg total) under the tongue every 5 (five) minutes as needed for chest pain.   25 tablet   6   .  omeprazole (PRILOSEC) 40 MG  capsule  TAKE 1 CAPSULE EVERY DAY   90 capsule   3   .  rOPINIRole (REQUIP) 1 MG tablet  Take one and 1/2 pills three times per day   135 tablet   11       Current Facility-Administered Medications on File Prior to Visit   Medication  Dose  Route  Frequency  Provider  Last Rate  Last Dose   .  regadenoson (LEXISCAN) injection SOLN 0.4 mg   0.4 mg  Intravenous  Once  Jolaine Artist, MD     0.4 mg at 02/07/12 0919   .  technetium tetrofosmin (TC-MYOVIEW) injection 10 milli Curie   10 milli Curie  Intravenous  Once PRN  Jolaine Artist, MD     [place] at 02/07/12 0745   .  technetium tetrofosmin (TC-MYOVIEW) injection 30 milli Curie   30 milli Curie  Intravenous  Once PRN  Jolaine Artist, MD     [place] at 02/07/12 [phone]  Past Medical History   Diagnosis  Date   .  BRADYCARDIA  10/24/2010   .  CALF PAIN, RIGHT  10/22/2009   .  CEREBROVASCULAR ACCIDENT  07/23/2008   .  Dysuria  10/10/2010   .  ERECTILE DYSFUNCTION  04/05/2007   .  FATIGUE  10/02/2007   .  GLUCOSE INTOLERANCE  07/15/2008   .  HYPERLIPIDEMIA  07/30/2008   .  HYPERSOMNIA  10/02/2007   .  INSOMNIA-SLEEP DISORDER-UNSPEC  10/22/2009   .  LOW BACK PAIN  04/05/2007   .  OSTEOARTHRITIS  04/05/2007   .  PEPTIC ULCER DISEASE  04/05/2007   .  PERIPHERAL EDEMA  10/22/2009   .  PERIPHERAL VASCULAR DISEASE  07/30/2008   .  PROSTATE CANCER, HX OF  04/05/2007   .  RESTLESS LEG SYNDROME  05/15/2007   .  RHINITIS, ALLERGIC NOS  05/15/2007   .  SHOULDER PAIN, LEFT  01/22/2008   .  SINUSITIS- ACUTE-NOS  05/15/2007   .  Unspecified essential hypertension  04/05/2007   .  Unspecified visual loss  07/15/2008   .  UTI  10/24/2010   .  Impaired glucose tolerance  02/09/2011       Past Surgical History   Procedure  Date   .  Inguinal herniorrhapy left  2002   .  Rotator cuff repair         left   .  Knee surgery     .  S/p ventral surgery  2009   .  S/p rad protatectomy  2002   .  Right hip replacement  09/22/09   .  Left hip  replacement     .  Eye surgery         Family History   Problem  Relation  Age of Onset   .  Heart attack  Father  48       Smoker   .  Heart attack  Brother  68       History       Social History   .  Marital Status:  Married       Spouse Name:  N/A       Number of Children:  2   .  Years of Education:  N/A       Occupational History   .  truck driver-retired         Social History Main Topics   .  Smoking status:  Former Smoker -- 1.0 packs/day for 15 years       Types:  Cigarettes       Quit date:  02/05/1977   .  Smokeless tobacco:  Not on file   .  Alcohol Use:  0.5 oz/week       1 drink(s) per week   .  Drug Use:  No   .  Sexually Active:  Not on file       Other Topics  Concern   .  Not on file       Social History Narrative   .  No narrative on file      ROS: See HPI Eyes: Negative Ears:Negative for hearing loss, tinnitus Cardiovascular: Negative for, palpitations,irregular heartbeat, dyspnea, near-syncope, orthopnea, paroxysmal nocturnal dyspnea and syncope,edema, claudication, cyanosis,.  Respiratory:   Negative for cough, hemoptysis, shortness of breath, sleep disturbances due to breathing, sputum production and wheezing.   Endocrine: Negative for cold intolerance and heat intolerance.  Hematologic/Lymphatic: Negative for adenopathy  and bleeding problem. Does not bruise/bleed easily.  Musculoskeletal: Negative.   Gastrointestinal: Negative for nausea, vomiting, reflux, abdominal pain, diarrhea, constipation.   Genitourinary: Negative for bladder incontinence, dysuria, flank pain, frequency, hematuria, hesitancy, nocturia and urgency.  Neurological: Negative.   Allergic/Immunologic: Negative for environmental allergies.     PHYSICAL EXAM: Well-nournished, in no acute distress. HEENT:Head is normocephalic without sign of trauma, extraocular eye movements are intact, pupils were equal and reactive to light accommodation, nasal mucosa is moist,  throat is without erythema or exudate  Neck: No JVD, HJR, Bruit, or thyroid enlargement  Lungs: No tachypnea, clear without wheezing, rales, or rhonchi  Cardiovascular: RRR, PMI not displaced, heart sounds normal, no murmurs, gallops, bruit, thrill, or heave.  Abdomen: BS normal. Soft without organomegaly, masses, lesions or tenderness.  Extremities: without cyanosis, clubbing or edema. Good distal pulses bilateral  SKin: Warm, no lesions or rashes  Musculoskeletal: No deformities  Neuro: no focal signs   BP: 122/70 Pulse:42 [id number] bradycardia with poor R-wave progression, ST-T wave abnormality inferior anterior lateral consider ischemia   Chest CT 12/29/11: 1. Ground-glass nodule in the right upper lobe measures 0.8 cm. Initial follow-up of this nodule in 3 months is advised to confirm persistence. 2. Borderline mediastinal lymph nodes. Attention on follow-up exam advised. 3. There is atherosclerosis of the thoracic aorta, the great vessels of the mediastinum and the coronary arteries, including calcified atherosclerotic plaque in the LAD and left circumflex coronary arteries            CAD (coronary artery disease) - Ermalinda Barrios, PA  02/07/2012  1:17 PM  Addendum He has evidence of coronary artery calcification on CT scan chest in May 2013. He does describe chest pains and fatigue. His other risk factors for obstructive CAD include HTN, HLD and glucose intolerance.Stress test today showed diffuse ST depression which could be due to ischemia versus LVH. He did have a small apical defect on perfusion scan. Because of this his history of chest pain, cardiac catheterization will be performed to rule out blockage.Risk and benefits have been explained in detail to the patient including heart attack or stroke. Patient is agreeable.We will add Imdur 30 mg once daily, p.r.n. Nitroglycerin, and stop his voltaren     Previous Version  HYPERLIPIDEMIA Ermalinda Barrios, [place]  02/07/2012  1:18  PM  Signed stable  Mitral regurgitation - Ermalinda Barrios, PA  02/07/2012  1:19 PM  Signed 2-D echo is scheduled to followup his MR on Monday. We will reschedule his because of the cardiac catheterization being performed that same day.

## 2012-02-07 NOTE — Patient Instructions (Addendum)
Your physician has recommended you make the following change in your medication: START Isosorbide 30 mg daily and Nitro 0.4 mg as needed  STOP Voltaren

## 2012-02-07 NOTE — Assessment & Plan Note (Addendum)
He has evidence of coronary artery calcification on CT scan chest in May 2013. He does describe chest pains and fatigue. His other risk factors for obstructive CAD include HTN, HLD and glucose intolerance.Stress test today showed diffuse ST depression which could be due to ischemia versus LVH. He did have a small apical defect on perfusion scan. Because of this his history of chest pain, cardiac catheterization will be performed to rule out blockage.Risk and benefits have been explained in detail to the patient including heart attack or stroke. Patient is agreeable.We will add Imdur 30 mg once daily, p.r.n. Nitroglycerin, and stop his voltaren

## 2012-02-07 NOTE — Progress Notes (Signed)
Uncertain 3 NUCLEAR MED [place] [address] [phone]  Cardiology Nuclear Med Study  Victor Davis is a 71 y.o. male     MRN : [id number]     DOB: 02-09-1941  Procedure Date: 02/07/2012  Nuclear Med Background Indication for Stress Test:  Evaluation for Ischemia, and Abnormal Chest CT: calcification of aorta and coronary arteries History:  '09 Echo:Normal LVF, mild MR Cardiac Risk Factors: CVA, Family History - CAD, History of Smoking, Hypertension, Lipids and PVD  Symptoms:  Chest Pain with and without Exertion (last episode of chest discomfort was about 2-3 weeks ago), DOE, Fatigue and Nausea   Nuclear Pre-Procedure Caffeine/Decaff Intake:  None > 12 hrs NPO After: 7:00pm   Lungs:  Clear. IV 0.9% NS with Angio Cath:  20g  IV Site: R Antecubital x 1, tolerated well IV Started by:  Irven Baltimore, RN  Chest Size (in):  38 Cup Size: n/a  Height: 5\' 8"  (1.727 m)  Weight:  148 lb (67.132 kg)  BMI:  Body mass index is 22.50 kg/(m^2). Tech Comments:  Took norvasc, lisinopril, and clonidine this am    Nuclear Med Study 1 or 2 day study: 1 day  Stress Test Type:  Treadmill/Lexiscan  Reading MD: Glori Bickers, MD  Order Authorizing Provider:  Lauree Chandler, MD  Resting Radionuclide: Technetium 60m Tetrofosmin  Resting Radionuclide Dose: 11.0 mCi   Stress Radionuclide:  Technetium 21m Tetrofosmin  Stress Radionuclide Dose: 33.0 mCi           Stress Protocol Rest HR: 42 Stress HR: 120  Rest BP: 122/70 Stress BP: 186/82  Exercise Time (min): 5:30 METS: 4.6   Predicted Max HR: 148 bpm % Max HR: 81.08 bpm Rate Pressure Product: 22320   Dose of Adenosine (mg):  n/a Dose of Lexiscan: 0.4 mg  Dose of Atropine (mg): n/a Dose of Dobutamine: n/a mcg/kg/min (at max HR)  Stress Test Technologist: Letta Moynahan, CMA-N  Nuclear Technologist:  Annye Rusk, CNMT     Rest Procedure:  Myocardial perfusion imaging was performed at rest 45  minutes following the intravenous administration of Technetium 108m Tetrofosmin.  Rest ECG: LVH with strain and possible prior SWMI; changed from EKG of 2/11.  Stress Procedure: The patient initially walked on the treadmill for 3:30 utilizing the Bruce protocol but was unable to achieve his target heart rate.  He c/o shortness of breath and had marked ST-T wave changes. He was then given IV Lexiscan 0.4 mg over 15-seconds with concurrent low level exercise and then Technetium 72m Tetrofosmin was injected at 30-seconds while the patient continued walking one more minute. There were significant changes with prior to [place] being given. Quantitative spect images were obtained after a 45-minute delay.  Stress ECG: Baseline ECG is abnormal. There is 1-2 mm ST depression inferior and lateral leads with stress.  QPS Raw Data Images:  Normal; no motion artifact; normal heart/lung ratio. Stress Images:  Normal homogeneous uptake in all areas of the myocardium. Rest Images:  Normal homogeneous uptake in all areas of the myocardium. Subtraction (SDS):  Normal Transient Ischemic Dilatation (Normal <1.22):  1.24 Lung/Heart Ratio (Normal <0.45):  0.31  Quantitative Gated Spect Images QGS EDV:  116 ml QGS ESV:  58 ml  Impression Exercise Capacity:  Lexiscan with low level exercise. BP Response:  Normal blood pressure response. Clinical Symptoms:  There is CP.  ECG Impression:  Baseline ECG is abnormal with septal Qs and marked anterolateral TWI.  With stress there is 1-2 mm ST depression inferior and lateral leads that resolves with rest. Comparison with Prior Nuclear Study: No previous nuclear study performed  Overall Impression:  Low risk stress nuclear study. Perfusion imaging is normal. However there is 1-2 mm of inferolateral ST depression on ECG with stress.    LV Ejection Fraction: 50%.  LV Wall Motion:  Normal

## 2012-02-07 NOTE — Assessment & Plan Note (Signed)
2-D echo is scheduled to followup his MR on Monday. We will reschedule his because of the cardiac catheterization being performed that same day.

## 2012-02-07 NOTE — Progress Notes (Signed)
HPI: 71 yo white male with history of CVA, HTN, HLD, PAD, prostate cancer who saw Dr. Angelena Form yesterday as a new patient for evaluation of abnormal chest CT. His chest CT showed calcification of the aorta and coronary arteries. He describes occasional chest pains that occur at rest and with exertion. No SOB. He describes being unsteady on his feet. Also describes fatigue and being angry often. This seems to affect his blood pressure. Echo 2009 with normal LV function, mild MR. He reports poor control of BP lately despite medical therapy.  Stress Myoview was performed in the office today. Resting EKG showed marked sinus bradycardia of 42 with LVH and inferior lateral ST-T wave inversion patient had marked ST depression with stress but this could be due to LVH. He does have a small apical defect on perfusion study and is having exertional chest pain. Because of this cardiac catheterization has been recommended by Dr. Lia Foyer. The patient will have this done July 1 by Dr. Angelena Form.     Allergies  Allergen Reactions  . Amitriptyline Hcl     REACTION: confusion  . Diphenhydramine Hcl   . Simvastatin     REACTION: leg pain    Current Outpatient Prescriptions on File Prior to Visit  Medication Sig Dispense Refill  . amLODipine (NORVASC) 10 MG tablet TAKE 1 TABLET EVERY DAY  30 tablet  8  . aspirin 81 MG EC tablet Take 81 mg by mouth daily.        . cloNIDine (CATAPRES) 0.2 MG tablet Take 1 tablet (0.2 mg total) by mouth 2 (two) times daily.  60 tablet  6  . isosorbide mononitrate (IMDUR) 30 MG 24 hr tablet Take 1 tablet (30 mg total) by mouth daily.  30 tablet  6  . lisinopril (PRINIVIL,ZESTRIL) 40 MG tablet TAKE 1 TABLET BY MOUTH ONCE DAILY  90 tablet  3  . nitroGLYCERIN (NITROSTAT) 0.4 MG SL tablet Place 1 tablet (0.4 mg total) under the tongue every 5 (five) minutes as needed for chest pain.  25 tablet  6  . omeprazole (PRILOSEC) 40 MG capsule TAKE 1 CAPSULE EVERY DAY  90 capsule  3  .  rOPINIRole (REQUIP) 1 MG tablet Take one and 1/2 pills three times per day  135 tablet  11   Current Facility-Administered Medications on File Prior to Visit  Medication Dose Route Frequency Provider Last Rate Last Dose  . regadenoson (LEXISCAN) injection SOLN 0.4 mg  0.4 mg Intravenous Once Jolaine Artist, MD   0.4 mg at 02/07/12 0919  . technetium tetrofosmin (TC-MYOVIEW) injection 10 milli Curie  10 milli Curie Intravenous Once PRN Jolaine Artist, MD   [place] at 02/07/12 0745  . technetium tetrofosmin (TC-MYOVIEW) injection 30 milli Curie  30 milli Curie Intravenous Once PRN Jolaine Artist, MD   [place] at 02/07/12 0920    Past Medical History  Diagnosis Date  . BRADYCARDIA 10/24/2010  . CALF PAIN, RIGHT 10/22/2009  . 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 Dysuria 10/10/2010  . ERECTILE DYSFUNCTION 04/05/2007  . FATIGUE 10/02/2007  . GLUCOSE INTOLERANCE 07/15/2008  . HYPERLIPIDEMIA 07/30/2008  . HYPERSOMNIA 10/02/2007  . INSOMNIA-SLEEP DISORDER-UNSPEC 10/22/2009  . LOW BACK PAIN 04/05/2007  . OSTEOARTHRITIS 04/05/2007  . PEPTIC ULCER DISEASE 04/05/2007  . PERIPHERAL EDEMA 10/22/2009  . PERIPHERAL VASCULAR DISEASE 07/30/2008  . PROSTATE CANCER, HX OF 04/05/2007  . RESTLESS LEG SYNDROME 05/15/2007  . RHINITIS, ALLERGIC NOS 05/15/2007  . SHOULDER PAIN, LEFT 01/22/2008  .  SINUSITIS- ACUTE-NOS 05/15/2007  . Unspecified essential hypertension 04/05/2007  . Unspecified visual loss 07/15/2008  . UTI 10/24/2010  . Impaired glucose tolerance 02/09/2011    Past Surgical History  Procedure Date  . Inguinal herniorrhapy left 2002  . Rotator cuff repair     left  . Knee surgery   . S/p ventral surgery 2009  . S/p rad protatectomy 2002  . Right hip replacement 09/22/09  . Left hip replacement   . Eye surgery     Family History  Problem Relation Age of Onset  . Heart attack Father 32    Smoker  . Heart attack Brother 75    History   Social History  . Marital Status:  Married    Spouse Name: N/A    Number of Children: 2  . Years of Education: N/A   Occupational History  . truck driver-retired    Social History Main Topics  . Smoking status: Former Smoker -- 1.0 packs/day for 15 years    Types: Cigarettes    Quit date: 02/05/1977  . Smokeless tobacco: Not on file  . Alcohol Use: 0.5 oz/week    1 drink(s) per week  . Drug Use: No  . Sexually Active: Not on file   Other Topics Concern  . Not on file   Social History Narrative  . No narrative on file    ROS: See HPI Eyes: Negative Ears:Negative for hearing loss, tinnitus Cardiovascular: Negative for, palpitations,irregular heartbeat, dyspnea, near-syncope, orthopnea, paroxysmal nocturnal dyspnea and syncope,edema, claudication, cyanosis,.  Respiratory:   Negative for cough, hemoptysis, shortness of breath, sleep disturbances due to breathing, sputum production and wheezing.   Endocrine: Negative for cold intolerance and heat intolerance.  Hematologic/Lymphatic: Negative for adenopathy and bleeding problem. Does not bruise/bleed easily.  Musculoskeletal: Negative.   Gastrointestinal: Negative for nausea, vomiting, reflux, abdominal pain, diarrhea, constipation.   Genitourinary: Negative for bladder incontinence, dysuria, flank pain, frequency, hematuria, hesitancy, nocturia and urgency.  Neurological: Negative.  Allergic/Immunologic: Negative for environmental allergies.   PHYSICAL EXAM: Well-nournished, in no acute distress. HEENT:Head is normocephalic without sign of trauma, extraocular eye movements are intact, pupils were equal and reactive to light accommodation, nasal mucosa is moist, throat is without erythema or exudate Neck: No JVD, HJR, Bruit, or thyroid enlargement Lungs: No tachypnea, clear without wheezing, rales, or rhonchi Cardiovascular: RRR, PMI not displaced, heart sounds normal, no murmurs, gallops, bruit, thrill, or heave. Abdomen: BS normal. Soft without organomegaly,  masses, lesions or tenderness. Extremities: without cyanosis, clubbing or edema. Good distal pulses bilateral SKin: Warm, no lesions or rashes  Musculoskeletal: No deformities Neuro: no focal signs  BP: 122/70 Pulse:42 [id number] bradycardia with poor R-wave progression, ST-T wave abnormality inferior anterior lateral consider ischemia  Chest CT 12/29/11: 1. Ground-glass nodule in the right upper lobe measures 0.8 cm. Initial follow-up of this nodule in 3 months is advised to confirm persistence. 2. Borderline mediastinal lymph nodes. Attention on follow-up exam advised. 3. There is atherosclerosis of the thoracic aorta, the great vessels of the mediastinum and the coronary arteries, including calcified atherosclerotic plaque in the LAD and left circumflex coronary arteries

## 2012-02-08 ENCOUNTER — Ambulatory Visit (INDEPENDENT_AMBULATORY_CARE_PROVIDER_SITE_OTHER): Payer: Medicare Other | Admitting: Internal Medicine

## 2012-02-08 ENCOUNTER — Encounter: Payer: Self-pay | Admitting: Internal Medicine

## 2012-02-08 VITALS — BP 138/78 | HR 62 | Temp 97.8°F | Ht 68.0 in | Wt 149.5 lb

## 2012-02-08 DIAGNOSIS — R911 Solitary pulmonary nodule: Secondary | ICD-10-CM | POA: Insufficient documentation

## 2012-02-08 DIAGNOSIS — N2889 Other specified disorders of kidney and ureter: Secondary | ICD-10-CM

## 2012-02-08 DIAGNOSIS — Z0000 Encounter for general adult medical examination without abnormal findings: Secondary | ICD-10-CM

## 2012-02-08 DIAGNOSIS — E0590 Thyrotoxicosis, unspecified without thyrotoxic crisis or storm: Secondary | ICD-10-CM

## 2012-02-08 DIAGNOSIS — N289 Disorder of kidney and ureter, unspecified: Secondary | ICD-10-CM

## 2012-02-08 NOTE — Assessment & Plan Note (Signed)
RUL incidental on may 2013 CT chest - will arrange for f/u at 3 mo as recommended

## 2012-02-08 NOTE — Patient Instructions (Addendum)
Continue all other medications as before Please keep your appointments with your specialists as you have planned - the catheterization for Mon next wk Please see the [facility] today before leaving to schedule the thyroid scan that has already been ordered I have also ordered the MRI for Mon July 8 I have also ordered the CT Chest for mid August 2013 --- you will likely be called about these 2 tests Please have the pharmacy call with any refills you may need. Please return in 6 mo with Lab testing done 3-5 days before

## 2012-02-08 NOTE — Assessment & Plan Note (Signed)
U/s may 2013 suspicious for malignancy - for MRI abd asap, may need urology referral

## 2012-02-08 NOTE — Assessment & Plan Note (Addendum)
Some communication problem - still needs thyroid scan- has been ordered, will ask pt to see [facility] before leaving today, depending on results, will need to f/u with Dr Loanne Drilling

## 2012-02-10 ENCOUNTER — Encounter: Payer: Self-pay | Admitting: Internal Medicine

## 2012-02-10 NOTE — Progress Notes (Signed)
Subjective:    Patient ID: Victor Davis, male    DOB: 23-Jul-1941, 71 y.o.   MRN: [id number]  HPI  Here to f/u with wife who recently received a letter from insurance company stating the thyroid scan ordered per Dr Loanne Drilling is now approved, though initially it was denied.  Denies hyper or hypo thyroid symptoms such as voice, skin or hair change, but last TSH very low.  Also with recent CT with RUL nodule with suggestion for f/u at 3 mo, also with renal mass that will need further MRI due to ? Of renal cell ca.  Pt denies chest pain, increased sob or doe, wheezing, orthopnea, PND, increased LE swelling, palpitations, dizziness or syncope.  Pt denies new neurological symptoms such as new headache, or facial or extremity weakness or numbness   Pt denies polydipsia, polyuria. Pt denies fever, wt loss, night sweats, loss of appetite, or other constitutional symptoms.  Has cardiac cath scheduled for Mon next wk Past Medical History  Diagnosis Date  . BRADYCARDIA 10/24/2010  . CALF PAIN, RIGHT 10/22/2009  . 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 Dysuria 10/10/2010  . ERECTILE DYSFUNCTION 04/05/2007  . FATIGUE 10/02/2007  . GLUCOSE INTOLERANCE 07/15/2008  . HYPERLIPIDEMIA 07/30/2008  . HYPERSOMNIA 10/02/2007  . INSOMNIA-SLEEP DISORDER-UNSPEC 10/22/2009  . LOW BACK PAIN 04/05/2007  . OSTEOARTHRITIS 04/05/2007  . PEPTIC ULCER DISEASE 04/05/2007  . PERIPHERAL EDEMA 10/22/2009  . PERIPHERAL VASCULAR DISEASE 07/30/2008  . PROSTATE CANCER, HX OF 04/05/2007  . RESTLESS LEG SYNDROME 05/15/2007  . RHINITIS, ALLERGIC NOS 05/15/2007  . SHOULDER PAIN, LEFT 01/22/2008  . SINUSITIS- ACUTE-NOS 05/15/2007  . Unspecified essential hypertension 04/05/2007  . Unspecified visual loss 07/15/2008  . UTI 10/24/2010  . Impaired glucose tolerance 02/09/2011   Past Surgical History  Procedure Date  . Inguinal herniorrhapy left 2002  . Rotator cuff repair     left  . Knee surgery   . S/p ventral surgery 2009  . S/p rad protatectomy  2002  . Right hip replacement 09/22/09  . Left hip replacement   . Eye surgery     reports that he quit smoking about 35 years ago. His smoking use included Cigarettes. He has a 15 pack-year smoking history. He does not have any smokeless tobacco history on file. He reports that he drinks about .5 ounces of alcohol per week. He reports that he does not use illicit drugs. family history includes Heart attack (age of onset:54) in his father and Heart attack (age of onset:70) in his brother. Allergies  Allergen Reactions  . Amitriptyline Hcl     REACTION: confusion  . Diphenhydramine Hcl   . Simvastatin     REACTION: leg pain   Current Outpatient Prescriptions on File Prior to Visit  Medication Sig Dispense Refill  . amLODipine (NORVASC) 10 MG tablet TAKE 1 TABLET EVERY DAY  30 tablet  8  . aspirin 81 MG EC tablet Take 81 mg by mouth daily.        . cloNIDine (CATAPRES) 0.2 MG tablet Take 1 tablet (0.2 mg total) by mouth 2 (two) times daily.  60 tablet  6  . isosorbide mononitrate (IMDUR) 30 MG 24 hr tablet Take 1 tablet (30 mg total) by mouth daily.  30 tablet  6  . lisinopril (PRINIVIL,ZESTRIL) 40 MG tablet TAKE 1 TABLET BY MOUTH ONCE DAILY  90 tablet  3  . nitroGLYCERIN (NITROSTAT) 0.4 MG SL tablet Place 1 tablet (0.4 mg total) under the tongue every 5 (five)  minutes as needed for chest pain.  25 tablet  6  . omeprazole (PRILOSEC) 40 MG capsule TAKE 1 CAPSULE EVERY DAY  90 capsule  3  . rOPINIRole (REQUIP) 1 MG tablet Take one and 1/2 pills three times per day  135 tablet  11   Review of Systems Review of Systems  Constitutional: Negative for diaphoresis and unexpected weight change.   Eyes: Negative for photophobia and visual disturbance.  Respiratory: Negative for choking and stridor.   Gastrointestinal: Negative for vomiting and blood in stool.  Genitourinary: Negative for hematuria and decreased urine volume.  Musculoskeletal: Negative for gait problem.  Skin: Negative for color  change and wound.  Neurological: Negative for tremors and numbness.  Psychiatric/Behavioral: Negative for decreased concentration. The patient is not hyperactive.      Objective:   Physical Exam BP 138/78  Pulse 62  Temp 97.8 F (36.6 C) (Oral)  Ht 5\' 8"  (1.727 m)  Wt 149 lb 8 oz (67.813 kg)  BMI 22.73 kg/m2  SpO2 94% Physical Exam  VS noted Constitutional: Pt appears well-developed and well-nourished.  HENT: Head: Normocephalic.  Right Ear: External ear normal.  Left Ear: External ear normal.  Eyes: Conjunctivae and EOM are normal. Pupils are equal, round, and reactive to light.  Neck: Normal range of motion. Neck supple.  Cardiovascular: Normal rate and regular rhythm.   Pulmonary/Chest: Effort normal and breath sounds normal.  Abd:  Soft, NT, non-distended, + BS Neurological: Pt is alert. Skin: Skin is warm. No erythema.  Psychiatric: Pt behavior is normal. Thought content normal.     Assessment & Plan:

## 2012-02-11 NOTE — H&P (Signed)
Patient is stable, but has a clearly abnormal stress GXT.  I discussed with the patient and we will make arrangements for cath study with Dr. Angelena Form on Monday.  Patient understands and accepts.

## 2012-02-12 ENCOUNTER — Inpatient Hospital Stay (HOSPITAL_BASED_OUTPATIENT_CLINIC_OR_DEPARTMENT_OTHER)
Admission: RE | Admit: 2012-02-12 | Discharge: 2012-02-12 | Disposition: A | Discharge status: Home / Self Care | Payer: Medicare Other | Source: Ambulatory Visit | Attending: Cardiovascular Disease | Admitting: Cardiovascular Disease

## 2012-02-12 ENCOUNTER — Encounter (HOSPITAL_COMMUNITY): Payer: Self-pay | Admitting: Pharmacy Technician

## 2012-02-12 ENCOUNTER — Other Ambulatory Visit (HOSPITAL_COMMUNITY): Payer: Medicare Other

## 2012-02-12 ENCOUNTER — Encounter (HOSPITAL_BASED_OUTPATIENT_CLINIC_OR_DEPARTMENT_OTHER)
Admission: RE | Disposition: A | Discharge status: Home / Self Care | Payer: Self-pay | Source: Ambulatory Visit | Attending: Cardiovascular Disease

## 2012-02-12 DIAGNOSIS — R079 Chest pain, unspecified: Secondary | ICD-10-CM

## 2012-02-12 DIAGNOSIS — I2510 Atherosclerotic heart disease of native coronary artery without angina pectoris: Secondary | ICD-10-CM | POA: Insufficient documentation

## 2012-02-12 DIAGNOSIS — R9439 Abnormal result of other cardiovascular function study: Secondary | ICD-10-CM

## 2012-02-12 SURGERY — JV LEFT HEART CATHETERIZATION WITH CORONARY ANGIOGRAM
Anesthesia: Moderate Sedation

## 2012-02-12 MED ORDER — SODIUM CHLORIDE 0.9 % IJ SOLN: 3.0000 mL | Freq: Two times a day (BID) | INTRAMUSCULAR | Status: DC

## 2012-02-12 MED ORDER — ASPIRIN 81 MG PO CHEW
324.0000 mg | CHEWABLE_TABLET | ORAL | Status: AC
  Administered 2012-02-12: 243 mg via ORAL

## 2012-02-12 MED ORDER — SODIUM CHLORIDE 0.9 % IV SOLN: INTRAVENOUS | Status: DC

## 2012-02-12 MED ORDER — SODIUM CHLORIDE 0.9 % IV SOLN: INTRAVENOUS | Status: AC

## 2012-02-12 MED ORDER — CLOPIDOGREL BISULFATE 300 MG PO TABS
600.0000 mg | ORAL_TABLET | Freq: Once | ORAL | Status: AC
  Administered 2012-02-12: 600 mg via ORAL

## 2012-02-12 MED ORDER — CLOPIDOGREL BISULFATE 75 MG PO TABS: 75.0000 mg | ORAL_TABLET | ORAL | Status: DC

## 2012-02-12 MED ORDER — DIAZEPAM 5 MG PO TABS
5.0000 mg | ORAL_TABLET | ORAL | Status: AC
  Administered 2012-02-12: 5 mg via ORAL

## 2012-02-12 MED ORDER — SODIUM CHLORIDE 0.9 % IJ SOLN: 3.0000 mL | INTRAMUSCULAR | Status: DC | PRN

## 2012-02-12 MED ORDER — ACETAMINOPHEN 325 MG PO TABS: 650.0000 mg | ORAL_TABLET | ORAL | Status: DC | PRN

## 2012-02-12 MED ORDER — SODIUM CHLORIDE 0.9 % IV SOLN: 250.0000 mL | INTRAVENOUS | Status: DC | PRN

## 2012-02-12 NOTE — OR Nursing (Signed)
Tegaderm dressing applied, site level 0, bedrest begins at 1245

## 2012-02-12 NOTE — OR Nursing (Signed)
Dr McAlhany at bedside to discuss results and treatment plan with pt and family 

## 2012-02-12 NOTE — CV Procedure (Signed)
   Cardiac Catheterization Operative Report  Victor Davis [id number] 7/1/201312:19 PM Cathlean Cower, MD  Procedure Performed:  1. Left Heart Catheterization 2. Selective Coronary Angiography 3. Left ventricular angiogram  Operator: Lauree Chandler, MD  Indication:  Abnormal stress myoview suggesting  Apical ischemia by perfusion imaging with inferolateral ST changes on baseline EKG with ST segment depression on exercise EKG.                               Procedure Details: The risks, benefits, complications, treatment options, and expected outcomes were discussed with the patient. The patient and/or family concurred with the proposed plan, giving informed consent. The patient was brought to the outpatient cath lab after IV hydration was begun and oral premedication was given.  The right groin was prepped and draped in the usual manner. Using the modified Seldinger access technique, a 4 French sheath was placed in the right femoral artery. Standard diagnostic catheters were used to perform selective coronary angiography. A pigtail catheter was used to perform a left ventricular angiogram.  There were no immediate complications. The patient was taken to the recovery area in stable condition.   Hemodynamic Findings: Central aortic pressure: 136/63 Left ventricular pressure: 135/12/20   Angiographic Findings:  Left main:  No obstructive disease noted.   Left Anterior Descending Artery: Large caliber vessel that courses to the apex. The proximal vessel has diffuse 40% stenosis. The mid vessel has a 95% stenosis followed by 80% stenosis. There are 3 small caliber diagonal branches that arise from the mid vessel. There is a large septal perforating branch.   Circumflex Artery:Moderate sized vessel with 80% proximal stenosis. The first OM is very small. The AV groove Circumflex terminates in a moderate sized bifurcating obtuse marginal branch. The inferior sub-branch has a 90-95% stenosis.    Right Coronary Artery: Large, dominant vessel with diffuse 30% stenosis in the proximal, mid and distal vessel. The PDA is moderate sized and patent. The PL branch is moderate sized and has a proximal 40% stenosis.   Left Ventricular Angiogram: LVEF 65-70%.   Impression: 1. 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  2. Preserved LV systolic function 3. Stress myoview suggesting apical ischemia with exercise EKG with ST depression.   Recommendations: He has double vessel CAD which I feel can be approached with PCI. Will stage PCI of the LAD and Circumflex later in the week. Will load with Plavix 600 mg po x 1 today. He will be started on ASA and Plavix daily until the PCI.        Complications:  None. The patient tolerated the procedure well.

## 2012-02-12 NOTE — OR Nursing (Signed)
Discharge instructions reviewed and signed, pt stated understanding, ambulated in hall without difficulty, site level 0, transported to wife's car via wheelchair 

## 2012-02-12 NOTE — Interval H&P Note (Signed)
History and Physical Interval Note:  02/12/2012 11:38 AM  Victor Davis  has presented today for surgery, with the diagnosis of abn stress test  The various methods of treatment have been discussed with the patient and family. After consideration of risks, benefits and other options for treatment, the patient has consented to  Procedure(s) (LRB): JV LEFT HEART CATHETERIZATION WITH CORONARY ANGIOGRAM (N/A) as a surgical intervention .  The patient's history has been reviewed, patient examined, no change in status, stable for surgery.  I have reviewed the patients' chart and labs.  Questions were answered to the patient's satisfaction.     Pamela Intrieri

## 2012-02-16 ENCOUNTER — Ambulatory Visit (HOSPITAL_COMMUNITY)
Admission: RE | Admit: 2012-02-16 | Discharge: 2012-02-16 | Disposition: A | Discharge status: Home / Self Care | Payer: Medicare Other | Source: Ambulatory Visit | Attending: Cardiovascular Disease | Admitting: Cardiovascular Disease

## 2012-02-16 ENCOUNTER — Other Ambulatory Visit (HOSPITAL_COMMUNITY): Payer: Medicare Other

## 2012-02-16 ENCOUNTER — Encounter (HOSPITAL_COMMUNITY)
Admission: RE | Disposition: A | Discharge status: Home / Self Care | Payer: Self-pay | Source: Ambulatory Visit | Attending: Cardiovascular Disease

## 2012-02-16 DIAGNOSIS — Z538 Procedure and treatment not carried out for other reasons: Secondary | ICD-10-CM | POA: Insufficient documentation

## 2012-02-16 DIAGNOSIS — I2510 Atherosclerotic heart disease of native coronary artery without angina pectoris: Secondary | ICD-10-CM | POA: Insufficient documentation

## 2012-02-16 LAB — CBC
HCT: 38 % — ABNORMAL LOW (ref 39.0–52.0)
Hemoglobin: 12.4 g/dL — ABNORMAL LOW (ref 13.0–17.0)
MCH: 29.1 pg (ref 26.0–34.0)
MCHC: 32.6 g/dL (ref 30.0–36.0)

## 2012-02-16 LAB — BASIC METABOLIC PANEL
BUN: 24 mg/dL — ABNORMAL HIGH (ref 6–23)
Chloride: 104 mEq/L (ref 96–112)
Glucose, Bld: 99 mg/dL (ref 70–99)
Potassium: 3.9 mEq/L (ref 3.5–5.1)

## 2012-02-16 SURGERY — PERCUTANEOUS CORONARY STENT INTERVENTION (PCI-S)
Anesthesia: LOCAL

## 2012-02-16 MED ORDER — SODIUM CHLORIDE 0.9 % IV SOLN
INTRAVENOUS | Status: DC
  Administered 2012-02-16: 1000 mL via INTRAVENOUS

## 2012-02-16 MED ORDER — SODIUM CHLORIDE 0.9 % IJ SOLN: 3.0000 mL | Freq: Two times a day (BID) | INTRAMUSCULAR | Status: DC

## 2012-02-16 MED ORDER — SODIUM CHLORIDE 0.9 % IV SOLN: 250.0000 mL | INTRAVENOUS | Status: DC | PRN

## 2012-02-16 MED ORDER — SODIUM CHLORIDE 0.9 % IJ SOLN: 3.0000 mL | INTRAMUSCULAR | Status: DC | PRN

## 2012-02-16 MED ORDER — DIAZEPAM 2 MG PO TABS
2.0000 mg | ORAL_TABLET | ORAL | Status: AC
  Administered 2012-02-16: 2 mg via ORAL

## 2012-02-16 MED ORDER — CLOPIDOGREL BISULFATE 75 MG PO TABS
75.0000 mg | ORAL_TABLET | ORAL | Status: AC
  Administered 2012-02-16: 75 mg via ORAL
  Filled 2012-02-16: qty 1

## 2012-02-16 MED ORDER — ASPIRIN 81 MG PO CHEW
324.0000 mg | CHEWABLE_TABLET | ORAL | Status: AC
  Administered 2012-02-16: 324 mg via ORAL
  Filled 2012-02-16: qty 4

## 2012-02-19 ENCOUNTER — Encounter (HOSPITAL_COMMUNITY)
Admission: RE | Admit: 2012-02-19 | Discharge: 2012-02-19 | Disposition: A | Discharge status: Home / Self Care | Payer: Medicare Other | Source: Ambulatory Visit | Attending: Internal Medicine | Admitting: Internal Medicine

## 2012-02-19 DIAGNOSIS — E0590 Thyrotoxicosis, unspecified without thyrotoxic crisis or storm: Secondary | ICD-10-CM

## 2012-02-20 ENCOUNTER — Telehealth: Payer: Self-pay | Admitting: Cardiovascular Disease

## 2012-02-20 ENCOUNTER — Other Ambulatory Visit (HOSPITAL_COMMUNITY): Payer: Medicare Other

## 2012-02-20 ENCOUNTER — Other Ambulatory Visit: Payer: Self-pay | Admitting: Internal Medicine

## 2012-02-20 DIAGNOSIS — I2510 Atherosclerotic heart disease of native coronary artery without angina pectoris: Secondary | ICD-10-CM

## 2012-02-20 MED ORDER — ZOLPIDEM TARTRATE 5 MG PO TABS: 5.0000 mg | ORAL_TABLET | Freq: Every evening | ORAL | Status: DC | PRN

## 2012-02-20 NOTE — Telephone Encounter (Signed)
New Problem:    Patient's called in because he has not been sleeping well since he had his cath performed and wanted to know if there was anything Dr. Angelena Form could give him.  Please call back.

## 2012-02-20 NOTE — Telephone Encounter (Signed)
Spoke with pt who reports trouble sleeping lately. Has had this problem in past and was unable to tolerate sleeping meds.  I asked him to contact Dr. Jenny Reichmann regarding this. Pt agreeable with this plan.  Per Dr. Angelena Form pt will need BMP later this week. I gave pt this information and he will come in for lab work AM of July 12.  He is aware to keep scheduled follow up appt in August with Dr. Angelena Form

## 2012-02-20 NOTE — Telephone Encounter (Signed)
Ok for ropinarole rx  Pt intolerant to TCA and benadryl in past  Ok for low dose generic ambien prn,  to f/u any worsening symptoms or concerns

## 2012-02-20 NOTE — Telephone Encounter (Signed)
Pt appears to have not taken this med recently as was last refilled June 2012  I am not sure he needs this based on last exam  Please let wife or pt know that if he has not been taking this recently, he can likely stop

## 2012-02-20 NOTE — Telephone Encounter (Signed)
Spoke to the patients wife and the patient is still taking the ropinirole.  The patients wife stated he is still having problems with restless legs and also would like something sent in to help him sleep.  Please advise and the patients wife would like a call back to inform.

## 2012-02-21 ENCOUNTER — Other Ambulatory Visit: Payer: Self-pay | Admitting: Internal Medicine

## 2012-02-21 ENCOUNTER — Ambulatory Visit
Admission: RE | Admit: 2012-02-21 | Discharge: 2012-02-21 | Disposition: A | Discharge status: Home / Self Care | Payer: Medicare Other | Source: Ambulatory Visit | Attending: Internal Medicine | Admitting: Internal Medicine

## 2012-02-21 ENCOUNTER — Encounter: Payer: Self-pay | Admitting: Internal Medicine

## 2012-02-21 DIAGNOSIS — N281 Cyst of kidney, acquired: Secondary | ICD-10-CM

## 2012-02-21 DIAGNOSIS — N2889 Other specified disorders of kidney and ureter: Secondary | ICD-10-CM

## 2012-02-21 NOTE — Telephone Encounter (Signed)
Called the patients wife informed prescriptions sent to [facility] as requested.

## 2012-02-22 ENCOUNTER — Other Ambulatory Visit (INDEPENDENT_AMBULATORY_CARE_PROVIDER_SITE_OTHER): Payer: Medicare Other

## 2012-02-22 DIAGNOSIS — I2510 Atherosclerotic heart disease of native coronary artery without angina pectoris: Secondary | ICD-10-CM

## 2012-02-23 ENCOUNTER — Other Ambulatory Visit: Payer: Self-pay | Admitting: *Deleted

## 2012-02-23 ENCOUNTER — Other Ambulatory Visit: Payer: Medicare Other

## 2012-02-23 ENCOUNTER — Telehealth: Payer: Self-pay | Admitting: Cardiovascular Disease

## 2012-02-23 DIAGNOSIS — I2510 Atherosclerotic heart disease of native coronary artery without angina pectoris: Secondary | ICD-10-CM

## 2012-02-23 LAB — BASIC METABOLIC PANEL
BUN: 25 mg/dL — ABNORMAL HIGH (ref 6–23)
CO2: 25 mEq/L (ref 19–32)
Chloride: 107 mEq/L (ref 96–112)
Creatinine, Ser: 1.9 mg/dL — ABNORMAL HIGH (ref 0.4–1.5)

## 2012-02-23 NOTE — Telephone Encounter (Signed)
Walk in pt Form " Pt has Questions for  McAlhany" sent to [facility] 02/23/12/KM

## 2012-03-01 ENCOUNTER — Other Ambulatory Visit (INDEPENDENT_AMBULATORY_CARE_PROVIDER_SITE_OTHER): Payer: Medicare Other

## 2012-03-01 DIAGNOSIS — I2510 Atherosclerotic heart disease of native coronary artery without angina pectoris: Secondary | ICD-10-CM

## 2012-03-01 LAB — BASIC METABOLIC PANEL
BUN: 27 mg/dL — ABNORMAL HIGH (ref 6–23)
Calcium: 9.1 mg/dL (ref 8.4–10.5)
GFR: 44.05 mL/min — ABNORMAL LOW (ref >60.00)
Glucose, Bld: 91 mg/dL (ref 70–99)
Potassium: 4.2 mEq/L (ref 3.5–5.1)

## 2012-03-04 ENCOUNTER — Telehealth: Payer: Self-pay | Admitting: Cardiovascular Disease

## 2012-03-04 NOTE — Telephone Encounter (Signed)
I spoke to the patient and his wife. Reportedly there is paperwork from urology stating renal cyst looks benign and no plans for workup. He needs to have a thyroid procedure. He wishes to schedule PCI soon. Renal function stable. Will call back tomorrow to set up PCI. cdm

## 2012-03-04 NOTE — Telephone Encounter (Signed)
Please return call to patient wife [name] [phone] regarding stent procedure as patient continues to loose weight and get worse.

## 2012-03-04 NOTE — Telephone Encounter (Signed)
Walk In Pt Form " Pt Dropped Off Records from [facility]"  Placed in [place]  03/04/12/KM

## 2012-03-04 NOTE — Telephone Encounter (Signed)
Patient's wife said that patient needs to have a test in [facility] for overactive Thyroid. According to the thyroid MD, Pt needs to have the stents done before the thyroid test is done, due to the Dye that it was used during the cardiac cath. It needs to wait a few weeks for the body to be   free of the dye.  Wife said pt is loosing a lot of weigh, he was 150 lbs when he had the cath, today he is 135 lbs. Patient is getting weak, and wife is concern, she would like to know when pt is going to be scheduled for the stents placement.

## 2012-03-05 ENCOUNTER — Encounter (HOSPITAL_COMMUNITY): Payer: Self-pay | Admitting: Pharmacy Technician

## 2012-03-05 ENCOUNTER — Other Ambulatory Visit: Payer: Self-pay

## 2012-03-05 DIAGNOSIS — Z01812 Encounter for preprocedural laboratory examination: Secondary | ICD-10-CM

## 2012-03-05 DIAGNOSIS — I2510 Atherosclerotic heart disease of native coronary artery without angina pectoris: Secondary | ICD-10-CM

## 2012-03-05 NOTE — Telephone Encounter (Signed)
Cath scheduled for 03/08/12 at 12:00pm. cdm

## 2012-03-05 NOTE — Addendum Note (Signed)
Addended by: Lauree Chandler D on: 03/05/2012 12:24 PM   Modules accepted: Orders

## 2012-03-06 ENCOUNTER — Other Ambulatory Visit (INDEPENDENT_AMBULATORY_CARE_PROVIDER_SITE_OTHER): Payer: Medicare Other

## 2012-03-06 DIAGNOSIS — Z01812 Encounter for preprocedural laboratory examination: Secondary | ICD-10-CM

## 2012-03-06 DIAGNOSIS — I2510 Atherosclerotic heart disease of native coronary artery without angina pectoris: Secondary | ICD-10-CM

## 2012-03-06 LAB — CBC WITH DIFFERENTIAL/PLATELET
Eosinophils Absolute: 0.2 10*3/uL (ref 0.0–0.7)
Eosinophils Relative: 2.7 % (ref 0.0–5.0)
HCT: 37 % — ABNORMAL LOW (ref 39.0–52.0)
Lymphs Abs: 2 10*3/uL (ref 0.7–4.0)
MCHC: 33 g/dL (ref 30.0–36.0)
MCV: 90 fl (ref 78.0–100.0)
Monocytes Absolute: 0.4 10*3/uL (ref 0.1–1.0)
Platelets: 133 10*3/uL — ABNORMAL LOW (ref 150.0–400.0)
WBC: 7.1 10*3/uL (ref 4.5–10.5)

## 2012-03-06 LAB — BASIC METABOLIC PANEL
BUN: 25 mg/dL — ABNORMAL HIGH (ref 6–23)
Calcium: 9.4 mg/dL (ref 8.4–10.5)
Creatinine, Ser: 1.4 mg/dL (ref 0.4–1.5)
GFR: 54.67 mL/min — ABNORMAL LOW (ref >60.00)
Glucose, Bld: 137 mg/dL — ABNORMAL HIGH (ref 70–99)
Potassium: 4.7 mEq/L (ref 3.5–5.1)

## 2012-03-07 NOTE — Telephone Encounter (Signed)
Paperwork filled out.  Pt was notified of instructions.

## 2012-03-08 ENCOUNTER — Ambulatory Visit (HOSPITAL_COMMUNITY)
Admission: RE | Admit: 2012-03-08 | Discharge: 2012-03-09 | Disposition: A | Discharge status: Home / Self Care | Payer: Medicare Other | Source: Ambulatory Visit | Attending: Cardiovascular Disease | Admitting: Cardiovascular Disease

## 2012-03-08 ENCOUNTER — Encounter (HOSPITAL_COMMUNITY)
Admission: RE | Disposition: A | Discharge status: Home / Self Care | Payer: Self-pay | Source: Ambulatory Visit | Attending: Cardiovascular Disease

## 2012-03-08 ENCOUNTER — Encounter (HOSPITAL_COMMUNITY): Payer: Self-pay | Admitting: General Practice

## 2012-03-08 DIAGNOSIS — Z955 Presence of coronary angioplasty implant and graft: Secondary | ICD-10-CM

## 2012-03-08 DIAGNOSIS — I2510 Atherosclerotic heart disease of native coronary artery without angina pectoris: Secondary | ICD-10-CM

## 2012-03-08 DIAGNOSIS — I209 Angina pectoris, unspecified: Secondary | ICD-10-CM | POA: Insufficient documentation

## 2012-03-08 DIAGNOSIS — E785 Hyperlipidemia, unspecified: Secondary | ICD-10-CM | POA: Diagnosis present

## 2012-03-08 DIAGNOSIS — I739 Peripheral vascular disease, unspecified: Secondary | ICD-10-CM | POA: Diagnosis present

## 2012-03-08 DIAGNOSIS — Z8673 Personal history of transient ischemic attack (TIA), and cerebral infarction without residual deficits: Secondary | ICD-10-CM | POA: Insufficient documentation

## 2012-03-08 DIAGNOSIS — I059 Rheumatic mitral valve disease, unspecified: Secondary | ICD-10-CM | POA: Insufficient documentation

## 2012-03-08 DIAGNOSIS — I340 Nonrheumatic mitral (valve) insufficiency: Secondary | ICD-10-CM | POA: Diagnosis present

## 2012-03-08 DIAGNOSIS — N390 Urinary tract infection, site not specified: Secondary | ICD-10-CM

## 2012-03-08 DIAGNOSIS — Z8546 Personal history of malignant neoplasm of prostate: Secondary | ICD-10-CM | POA: Insufficient documentation

## 2012-03-08 DIAGNOSIS — I10 Essential (primary) hypertension: Secondary | ICD-10-CM

## 2012-03-08 HISTORY — DX: Malignant (primary) neoplasm, unspecified: C80.1

## 2012-03-08 HISTORY — DX: Gastro-esophageal reflux disease without esophagitis: K21.9

## 2012-03-08 HISTORY — DX: Atherosclerotic heart disease of native coronary artery without angina pectoris: I25.10

## 2012-03-08 HISTORY — PX: PERCUTANEOUS CORONARY STENT INTERVENTION (PCI-S): SHX5485

## 2012-03-08 LAB — POCT ACTIVATED CLOTTING TIME: Activated Clotting Time: 389 seconds

## 2012-03-08 SURGERY — PERCUTANEOUS CORONARY STENT INTERVENTION (PCI-S)
Anesthesia: LOCAL

## 2012-03-08 MED ORDER — NITROGLYCERIN 0.2 MG/ML ON CALL CATH LAB
INTRAVENOUS | Status: AC
  Filled 2012-03-08: qty 1

## 2012-03-08 MED ORDER — ACETAMINOPHEN 325 MG PO TABS
650.0000 mg | ORAL_TABLET | ORAL | Status: DC | PRN
  Administered 2012-03-09: 650 mg via ORAL
  Filled 2012-03-08: qty 2

## 2012-03-08 MED ORDER — SODIUM CHLORIDE 0.9 % IV SOLN
INTRAVENOUS | Status: DC
  Administered 2012-03-08: 11:00:00 via INTRAVENOUS

## 2012-03-08 MED ORDER — ASPIRIN 81 MG PO CHEW
CHEWABLE_TABLET | ORAL | Status: AC
  Administered 2012-03-08: 324 mg via ORAL
  Filled 2012-03-08: qty 4

## 2012-03-08 MED ORDER — CLOPIDOGREL BISULFATE 300 MG PO TABS
ORAL_TABLET | ORAL | Status: AC
  Filled 2012-03-08: qty 1

## 2012-03-08 MED ORDER — ASPIRIN 81 MG PO CHEW
324.0000 mg | CHEWABLE_TABLET | ORAL | Status: AC
  Administered 2012-03-08: 324 mg via ORAL

## 2012-03-08 MED ORDER — DIAZEPAM 5 MG PO TABS
ORAL_TABLET | ORAL | Status: AC
  Filled 2012-03-08: qty 1

## 2012-03-08 MED ORDER — ROPINIROLE HCL 1 MG PO TABS
1.5000 mg | ORAL_TABLET | Freq: Three times a day (TID) | ORAL | Status: DC
  Administered 2012-03-08 – 2012-03-09 (×2): 1.5 mg via ORAL
  Filled 2012-03-08 (×4): qty 1

## 2012-03-08 MED ORDER — ZOLPIDEM TARTRATE 5 MG PO TABS: 5.0000 mg | ORAL_TABLET | Freq: Every evening | ORAL | Status: DC | PRN

## 2012-03-08 MED ORDER — SODIUM CHLORIDE 0.9 % IV SOLN
250.0000 mL | INTRAVENOUS | Status: DC | PRN
Start: 2012-03-08 — End: 2012-03-08

## 2012-03-08 MED ORDER — FENTANYL CITRATE 0.05 MG/ML IJ SOLN
INTRAMUSCULAR | Status: AC
  Filled 2012-03-08: qty 2

## 2012-03-08 MED ORDER — ALPRAZOLAM 0.25 MG PO TABS
0.2500 mg | ORAL_TABLET | Freq: Every evening | ORAL | Status: DC | PRN
Start: 2012-03-08 — End: 2012-03-09
  Administered 2012-03-08: 0.25 mg via ORAL
  Filled 2012-03-08: qty 1

## 2012-03-08 MED ORDER — DOXYCYCLINE HYCLATE 100 MG PO CAPS: 100.0000 mg | ORAL_CAPSULE | Freq: Two times a day (BID) | ORAL | Status: DC

## 2012-03-08 MED ORDER — CLOPIDOGREL BISULFATE 75 MG PO TABS
75.0000 mg | ORAL_TABLET | Freq: Every day | ORAL | Status: DC
  Administered 2012-03-09: 75 mg via ORAL
  Filled 2012-03-08: qty 1

## 2012-03-08 MED ORDER — DOXYCYCLINE HYCLATE 100 MG PO TABS
100.0000 mg | ORAL_TABLET | Freq: Two times a day (BID) | ORAL | Status: DC
  Administered 2012-03-09: 100 mg via ORAL
  Filled 2012-03-08 (×3): qty 1

## 2012-03-08 MED ORDER — DIAZEPAM 5 MG PO TABS
5.0000 mg | ORAL_TABLET | ORAL | Status: AC
  Administered 2012-03-08: 5 mg via ORAL

## 2012-03-08 MED ORDER — LIDOCAINE HCL (PF) 1 % IJ SOLN
INTRAMUSCULAR | Status: AC
  Filled 2012-03-08: qty 30

## 2012-03-08 MED ORDER — ASPIRIN EC 81 MG PO TBEC
81.0000 mg | DELAYED_RELEASE_TABLET | Freq: Every day | ORAL | Status: DC
  Administered 2012-03-09: 10:00:00 81 mg via ORAL
  Filled 2012-03-08: qty 1

## 2012-03-08 MED ORDER — ASPIRIN 81 MG PO TBEC: 81.0000 mg | DELAYED_RELEASE_TABLET | Freq: Every day | ORAL | Status: DC

## 2012-03-08 MED ORDER — SODIUM CHLORIDE 0.9 % IJ SOLN: 3.0000 mL | Freq: Two times a day (BID) | INTRAMUSCULAR | Status: DC

## 2012-03-08 MED ORDER — SODIUM CHLORIDE 0.9 % IV SOLN
INTRAVENOUS | Status: AC
  Administered 2012-03-08: 17:00:00 via INTRAVENOUS

## 2012-03-08 MED ORDER — SODIUM CHLORIDE 0.9 % IJ SOLN: 3.0000 mL | INTRAMUSCULAR | Status: DC | PRN

## 2012-03-08 MED ORDER — ONDANSETRON HCL 4 MG/2ML IJ SOLN: 4.0000 mg | Freq: Four times a day (QID) | INTRAMUSCULAR | Status: DC | PRN

## 2012-03-08 MED ORDER — BIVALIRUDIN 250 MG IV SOLR
INTRAVENOUS | Status: AC
  Filled 2012-03-08: qty 250

## 2012-03-08 MED ORDER — LORATADINE 10 MG PO TABS
10.0000 mg | ORAL_TABLET | Freq: Every day | ORAL | Status: DC
  Administered 2012-03-09: 10:00:00 10 mg via ORAL
  Filled 2012-03-08 (×2): qty 1

## 2012-03-08 MED ORDER — AMLODIPINE BESYLATE 5 MG PO TABS
5.0000 mg | ORAL_TABLET | Freq: Every day | ORAL | Status: DC
  Administered 2012-03-09: 08:00:00 5 mg via ORAL
  Filled 2012-03-08 (×2): qty 1

## 2012-03-08 MED ORDER — CLONIDINE HCL 0.2 MG PO TABS
0.2000 mg | ORAL_TABLET | Freq: Two times a day (BID) | ORAL | Status: DC
  Administered 2012-03-08 – 2012-03-09 (×2): 0.2 mg via ORAL
  Filled 2012-03-08 (×4): qty 1

## 2012-03-08 MED ORDER — NITROGLYCERIN 0.4 MG SL SUBL: 0.4000 mg | SUBLINGUAL_TABLET | SUBLINGUAL | Status: DC | PRN

## 2012-03-08 MED ORDER — ISOSORBIDE MONONITRATE ER 30 MG PO TB24
30.0000 mg | ORAL_TABLET | Freq: Every day | ORAL | Status: DC
  Administered 2012-03-09: 08:00:00 30 mg via ORAL
  Filled 2012-03-08 (×2): qty 1

## 2012-03-08 MED ORDER — MIDAZOLAM HCL 2 MG/2ML IJ SOLN
INTRAMUSCULAR | Status: AC
  Filled 2012-03-08: qty 2

## 2012-03-08 MED ORDER — HEPARIN (PORCINE) IN NACL 2-0.9 UNIT/ML-% IJ SOLN
INTRAMUSCULAR | Status: AC
  Filled 2012-03-08: qty 2000

## 2012-03-08 NOTE — Interval H&P Note (Signed)
History and Physical Interval Note:  03/08/2012 12:46 PM  Victor Davis  has presented today for surgery, with the diagnosis of blockage  The various methods of treatment have been discussed with the patient and family. After consideration of risks, benefits and other options for treatment, the patient has consented to  Procedure(s) (LRB): PERCUTANEOUS CORONARY STENT INTERVENTION (PCI-S) (N/A) as a surgical intervention .  The patient's history has been reviewed, patient examined, no change in status, stable for surgery.  I have reviewed the patient's chart and labs.  Questions were answered to the patient's satisfaction.     MCALHANY,CHRISTOPHER

## 2012-03-08 NOTE — CV Procedure (Signed)
   Cardiac Catheterization Operative Report  Victor Davis [id number] 7/26/20132:12 PM Cathlean Cower, MD  Procedure Performed:  1. PTCA/DES x 2 proximal and mid LAD 2. PTCA/DES x 1 mid Circumflex 3. PTCA/DES x 1 proximal Circumflex  Operator: Lauree Chandler, MD  Indication:  Severe CAD, angina.                              Procedure Details: The risks, benefits, complications, treatment options, and expected outcomes were discussed with the patient. The patient and/or family concurred with the proposed plan, giving informed consent. The patient was brought to the cath lab after IV hydration was begun and oral premedication was given. The patient was further sedated with Versed and Fentanyl. The right groin was prepped and draped in the usual manner. Using the modified Seldinger access technique, a 6 French sheath was placed in the right femoral artery. He was given a bolus of Angiomax and a drip was started. I then engaged the left main artery with a XB LAD 3.5 guiding catheter. When the ACT was greater than 200, I passed a Cougar IC wire down the LAD. A 2.5 x 15 mm balloon was used to dilate the mid lesion. A 2.75 x 16 mm Promus Element DES was deployed in the mid vessel. There was a very short segment of normal vessel between this stent and the proximal stenosis so I decided to cover the entire area. A 3.0 x 38 mm Promus Element DES was deployed in the proximal vessel overlapping with the mid stent. The mid stent was post-dilated with a 2.75 x 12 mm [place] balloon. The proximal and mid segment only post-dilated with a 3.25 x 20 mm [address] balloon x 2. There was an excellent angiographic result. The stenosis was taken from 90% down to 0%.   I then turned my attention to the Circumflex. The same guiding catheter was used. The Cougar IC wire was withdrawn into the guide and was advanced down the Circumflex. I used a 2.5 x 8 mm balloon to pre-dilate the mid stenosis. I then deployed a 2.5 x 12 mm Promus  Element DES in the mid Circumflex. This was post-dilated with a 2.75 x 8 mm  balloon. The stenosis was taken from 95% down to 0%.  I then used to this balloon to pre-dilate the proximal stenosis. A 3.0 x 16 mm Promus Element DES was deployed in the proximal vessel. This was post-dilated with a 3.25 x 12 mm  balloon x 1. There was an excellent angiographic result. The stenosis was taken from 80% down to 0%.   An Angioseal femoral artery closure device was placed in the right femoral artery.   There were no immediate complications. The patient was taken to the recovery area in stable condition.    Hemodynamic Findings: Central aortic pressure: 154/77  Impression: 1. Successful PTC/DES x 2 LAD 2.  Successful PTC/DES x 2 Circumflex  Recommendations: Will continue ASA and Plavix. Check P2Y12 in am. If PRU is greater than 210, will switch to Brilinta 90 mg po BID. Continue statin and beta blocker.        Complications:  None; patient tolerated the procedure well.

## 2012-03-08 NOTE — H&P (Signed)
History of Present Illness: 71 yo white male with history of CVA, HTN, HLD, PAD, prostate cancer who is here today for cardiac cath/PCI. I saw him in the office in June 2013 for evaluation of abnormal chest CT. His chest CT showed calcification of the aorta and coronary arteries. He described occasional chest pains that occur at rest and with exertion. Cardiac cath 3 weeks ago with severe disease in LAD and Circumflex with moderate disease in the RCA. We planned angioplasty and stent placement the following week but his renal function worsened post cath. 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 He also has thyroid issues. He called the office this week and states that he has been having more chest pain. PCI arranged today.   Primary Care Physician: Cathlean Cower  Past Medical History   Diagnosis  Date   .  BRADYCARDIA  10/24/2010   .  CALF PAIN, RIGHT  10/22/2009   .  CEREBROVASCULAR ACCIDENT  07/23/2008   .  Dysuria  10/10/2010   .  ERECTILE DYSFUNCTION  04/05/2007   .  FATIGUE  10/02/2007   .  GLUCOSE INTOLERANCE  07/15/2008   .  HYPERLIPIDEMIA  07/30/2008   .  HYPERSOMNIA  10/02/2007   .  INSOMNIA-SLEEP DISORDER-UNSPEC  10/22/2009   .  LOW BACK PAIN  04/05/2007   .  OSTEOARTHRITIS  04/05/2007   .  PEPTIC ULCER DISEASE  04/05/2007   .  PERIPHERAL EDEMA  10/22/2009   .  PERIPHERAL VASCULAR DISEASE  07/30/2008   .  PROSTATE CANCER, HX OF  04/05/2007   .  RESTLESS LEG SYNDROME  05/15/2007   .  RHINITIS, ALLERGIC NOS  05/15/2007   .  SHOULDER PAIN, LEFT  01/22/2008   .  SINUSITIS- ACUTE-NOS  05/15/2007   .  Unspecified essential hypertension  04/05/2007   .  Unspecified visual loss  07/15/2008   .  UTI  10/24/2010   .  Impaired glucose tolerance  02/09/2011   CAD  Past Surgical History   Procedure  Date   .  Inguinal herniorrhapy left  2002   .  Rotator cuff repair      left   .  Knee surgery    .  S/p ventral surgery  2009   .  S/p rad  protatectomy  2002   .  Right hip replacement  09/22/09   .  Left hip replacement    .  Eye surgery     Current Outpatient Prescriptions   Medication  Sig  Dispense  Refill   .  amLODipine (NORVASC) 10 MG tablet  TAKE 1 TABLET EVERY DAY  30 tablet  8   .  aspirin 81 MG EC tablet  Take 81 mg by mouth daily.     .  cloNIDine (CATAPRES) 0.1 MG tablet  Take 1 tablet (0.1 mg total) by mouth 2 (two) times daily.  60 tablet  8   .  diclofenac (VOLTAREN) 75 MG EC tablet  DAILY     .  lisinopril (PRINIVIL,ZESTRIL) 40 MG tablet  TAKE 1 TABLET BY MOUTH ONCE DAILY  90 tablet  3   .  omeprazole (PRILOSEC) 40 MG capsule  TAKE 1 CAPSULE EVERY DAY  90 capsule  3   .  rOPINIRole (REQUIP) 1 MG tablet  Take one and 1/2 pills three times per day  135 tablet  11   Plavix 75 mg  po Qdaily  Allergies   Allergen  Reactions   .  Amitriptyline Hcl      REACTION: confusion   .  Diphenhydramine Hcl    .  Simvastatin      REACTION: leg pain    History    Social History   .  Marital Status:  Married     Spouse Name:  N/A     Number of Children:  2   .  Years of Education:  N/A    Occupational History   .  truck driver-retired     Social History Main Topics   .  Smoking status:  Former Smoker -- 1.0 packs/day for 15 years     Types:  Cigarettes     Quit date:  02/05/1977   .  Smokeless tobacco:  Not on file   .  Alcohol Use:  0.5 oz/week     1 drink(s) per week   .  Drug Use:  No   .  Sexually Active:  Not on file    Other Topics  Concern   .  Not on file    Social History Narrative   .  No narrative on file    Family History   Problem  Relation  Age of Onset   .  Heart attack  Father  80      Smoker    .  Heart attack  Brother  3    Physical Examination:  General: Well developed, well nourished, NAD  HEENT: OP clear, mucus membranes moist  SKIN: warm, dry. No rashes.  Neuro: No focal deficits  Musculoskeletal: Muscle strength 5/5 all ext  Psychiatric: Mood and affect normal  Neck:  No JVD, no carotid bruits, no thyromegaly, no lymphadenopathy.  Lungs:Clear bilaterally, no wheezes, rhonci, crackles  Cardiovascular: Regular rate and rhythm. Slight systolic murmur. No gallops or rubs.  Abdomen:Soft. Bowel sounds present. Non-tender.  Extremities: No lower extremity edema. Pulses are 2 + in the bilateral DP/PT.   EKG: NSR, rate 63 bpm. LVH ? Old septal infarct. Non-specific T wave abnormalities.   Chest CT 12/29/11:  1. Ground-glass nodule in the right upper lobe measures 0.8 cm. Initial follow-up of this nodule in 3 months is advised to confirm persistence. 2. Borderline mediastinal lymph nodes. Attention on follow-up exam advised. 3. There is atherosclerosis of the thoracic aorta, the great vessels of the mediastinum and the coronary arteries, including calcified atherosclerotic plaque in the LAD and left circumflex coronary arteries.  Cardiac cath 02/12/12: Left main: No obstructive disease noted.  Left Anterior Descending Artery: Large caliber vessel that courses to the apex. The proximal vessel has diffuse 40% stenosis. The mid vessel has a 95% stenosis followed by 80% stenosis. There are 3 small caliber diagonal branches that arise from the mid vessel. There is a large septal perforating branch.  Circumflex Artery:Moderate sized vessel with 80% proximal stenosis. The first OM is very small. The AV groove Circumflex terminates in a moderate sized bifurcating obtuse marginal branch. The inferior sub-branch has a 90-95% stenosis.  Right Coronary Artery: Large, dominant vessel with diffuse 30% stenosis in the proximal, mid and distal vessel. The PDA is moderate sized and patent. The PL branch is moderate sized and has a proximal 40% stenosis.  Left Ventricular Angiogram: LVEF 65-70%.      1. CAD: PCI of LAD and Circumflex today.

## 2012-03-09 ENCOUNTER — Encounter (HOSPITAL_COMMUNITY): Payer: Self-pay | Admitting: Physician Assistant

## 2012-03-09 DIAGNOSIS — I10 Essential (primary) hypertension: Secondary | ICD-10-CM

## 2012-03-09 DIAGNOSIS — I2510 Atherosclerotic heart disease of native coronary artery without angina pectoris: Secondary | ICD-10-CM

## 2012-03-09 DIAGNOSIS — N390 Urinary tract infection, site not specified: Secondary | ICD-10-CM

## 2012-03-09 LAB — BASIC METABOLIC PANEL
BUN: 22 mg/dL (ref 6–23)
Chloride: 102 mEq/L (ref 96–112)
GFR calc Af Amer: 75 mL/min — ABNORMAL LOW (ref >90)
Glucose, Bld: 110 mg/dL — ABNORMAL HIGH (ref 70–99)
Potassium: 4.1 mEq/L (ref 3.5–5.1)
Sodium: 140 mEq/L (ref 135–145)

## 2012-03-09 LAB — CBC
HCT: 43.3 % (ref 39.0–52.0)
Hemoglobin: 14.5 g/dL (ref 13.0–17.0)
WBC: 7.2 10*3/uL (ref 4.0–10.5)

## 2012-03-09 MED ORDER — CLOPIDOGREL BISULFATE 75 MG PO TABS: 75.0000 mg | ORAL_TABLET | Freq: Every day | ORAL | Status: DC

## 2012-03-09 MED ORDER — CLONIDINE HCL 0.2 MG PO TABS: 0.2000 mg | ORAL_TABLET | Freq: Two times a day (BID) | ORAL | Status: DC

## 2012-03-09 MED ORDER — ISOSORBIDE MONONITRATE ER 30 MG PO TB24: 30.0000 mg | ORAL_TABLET | Freq: Every day | ORAL | Status: DC

## 2012-03-09 MED ORDER — AMLODIPINE BESYLATE 10 MG PO TABS: 10.0000 mg | ORAL_TABLET | Freq: Every day | ORAL | Status: DC

## 2012-03-09 MED ORDER — ATORVASTATIN CALCIUM 20 MG PO TABS: 20.0000 mg | ORAL_TABLET | Freq: Every day | ORAL | Status: DC

## 2012-03-09 MED ORDER — NITROGLYCERIN 0.4 MG SL SUBL: 0.4000 mg | SUBLINGUAL_TABLET | SUBLINGUAL | Status: DC | PRN

## 2012-03-09 NOTE — Progress Notes (Signed)
CARDIAC REHAB PHASE I   PRE:  Rate/Rhythm: 60 SR    BP: sitting 122/68    SaO2:   MODE:  Ambulation: 460 ft   POST:  Rate/Rhythm: 73 SR    BP: sitting 136/62     SaO2:   Tolerated well. Sts he has a "soreness" in his chest. Did not change with exertion however pt continued to st this throughout ed time (30 min). When asked further he sts its a "soreness" or "tightness" in left chest. Does not sound like his sx when he came in for PCI. Notified RN. Ed completed and requests his name be sent to [facility].  [id number]  Victor Davis CES, ACSM

## 2012-03-09 NOTE — Discharge Summary (Signed)
Discharge Summary   Patient ID: Victor Davis,  MRN: [id number], DOB/AGE: Jun 07, 1941 71 y.o.  Admit date: 03/08/2012 Discharge date: 03/09/2012  Primary Physician: Cathlean Cower, MD Primary Cardiologist: Darlina Guys, MD  Discharge Diagnoses Principal Problem:  *CAD (coronary artery disease) Active Problems:  HYPERLIPIDEMIA  PERIPHERAL VASCULAR DISEASE  Mitral regurgitation  Hypertension  UTI (lower urinary tract infection)   Allergies Allergies  Allergen Reactions  . Amitriptyline Hcl     REACTION: confusion  . Diphenhydramine Hcl Other (See Comments)    unknown  . Simvastatin     REACTION: leg pain    Diagnostic Studies/Procedures  CARDIAC CATHETERIZATION + PCI - 03/08/12  Hemodynamic Findings:  Central aortic pressure: 154/77  Impression:  1. Successful PTC/DES x 2 LAD  2. Successful PTC/DES x 2 Circumflex   History of Present Illness/Hospital Course  Victor Davis is a 71yo Caucasian male with the above problem list who underwent elective PCI on 03/08/12 after undergoing diagnostic cardiac catheterization on 02/12/12 revealing severe LAD and LCx stenosis. PCI of these lesions was scheduled for a later date and to coordinate with additional outpatient studies being pursued for fatigue and unintentional weight loss including a thyroid scan. Initial cath was performed after the patient complained of increased DOE, fatigue, exhibited evidence of EKG changes and an abnormal Myoview in the office. He presented yesterday for the PCI portion of the procedure yesterday. He was informed, consented and prepped for the procedure which was accessed via the R groin. Angiographic from prior cath includes normal left main, pLAD 40%, mLAD 95% lesion followed by 80% stenosis, moderate LCx with proximal 0% stenoisis, inferior sub-branch 90-95% stenosis, 30% proximal, mid and distal RCA, prox PL 40% stenosis. The end result post-PCI was PTCA/DES x 2 proximal and mid LAD, PTCA/DES x 1 mid  LCx, PTCA/DES x 1 prox LCx. The patient tolerated the procedure well without complications. The recommendation was made to continue DAPT- ASA/Plavix x 1 year. A P2Y12 assay was checked and found to be low (6), thus affirming the use of Plavix for antiplatelet benefit. He was assessed the following morning by Dr. Rayann Heman and felt to be stable for discharge. Of note, the patient was noted to be hypertensive this morning (SBP in low 200s), however had not had his antihypertensives including clonidine. After administering, his BP decreased to SBP 130s. He ambulated well with cardiac rehab thereafter. He will resume all outpatient medications.  BB was held in the setting of asymptomatic sinus bradycardia overnight. This will be reassessed on follow-up which will occur in approx 2 weeks. He will be contacted with the appointment date and time. This information, including activity restrictions, has been clearly outlined in the discharge AVS.   Discharge Vitals:  Blood pressure 122/68, pulse 63, temperature 98.2 F (36.8 C), temperature source Oral, resp. rate 17, height 5\' 8"  (1.727 m), weight 63.957 kg (141 lb), SpO2 98.00%.   Labs: Recent Labs  [facility] 03/09/12 0615   WBC 7.2   HGB 14.5   HCT 43.3   MCV 87.7   PLT 147*   Lab 03/09/12 0615 03/06/12 1031  NA 140 137  K 4.1 4.7  CL 102 105  CO2 26 24  BUN 22 25*  CREATININE 1.10 1.4  CALCIUM 9.9 9.4  PROT -- --  BILITOT -- --  ALKPHOS -- --  ALT -- --  AST -- --  AMYLASE -- --  LIPASE -- --  GLUCOSE 110* 137*   Disposition:  Discharge Orders  Future Appointments: Provider: Department: Dept Phone: Center:   03/21/2012 2:15 PM Burnell Blanks, [place] [phone] [facility]   04/01/2012 1:30 PM [facility] 1 [facility] [id number] [facility]   04/22/2012 8:00 AM [facility] [id number] [place]   04/23/2012 8:00 AM [facility] 2 [facility] [id number] [place]     Future Orders Please Complete  By Expires   Amb Referral to Cardiac Rehabilitation        Follow-up Information    Please follow up. (The office will contact you for a follow-up appointment date and time. )       Follow up with Cathlean Cower, MD. (In 1-2 weeks or as previously scheduled for continued follow-up of your thyroid, kidney and lung masses)    Contact information:   [address] [facility] [place] [phone]          Discharge Medications:  Medication List  As of 03/09/2012 11:02 AM   START taking these medications         atorvastatin 20 MG tablet   Commonly known as: LIPITOR   Take 1 tablet (20 mg total) by mouth daily.         CHANGE how you take these medications         amLODipine 10 MG tablet   Commonly known as: NORVASC   Take 1 tablet (10 mg total) by mouth daily.   What changed: See the new instructions.         CONTINUE taking these medications         aspirin 81 MG EC tablet      cloNIDine 0.2 MG tablet   Commonly known as: CATAPRES   Take 1 tablet (0.2 mg total) by mouth 2 (two) times daily.      clopidogrel 75 MG tablet   Commonly known as: PLAVIX   Take 1 tablet (75 mg total) by mouth daily.      doxycycline 100 MG capsule   Commonly known as: VIBRAMYCIN      isosorbide mononitrate 30 MG 24 hr tablet   Commonly known as: IMDUR   Take 1 tablet (30 mg total) by mouth daily.      loratadine 10 MG tablet   Commonly known as: CLARITIN      nitroGLYCERIN 0.4 MG SL tablet   Commonly known as: NITROSTAT   Place 1 tablet (0.4 mg total) under the tongue every 5 (five) minutes as needed for chest pain.      rOPINIRole 1 MG tablet   Commonly known as: REQUIP      zolpidem 5 MG tablet   Commonly known as: AMBIEN   Take 1 tablet (5 mg total) by mouth at bedtime as needed for sleep.          Where to get your medications    These are the prescriptions that you need to pick up. We sent them to a specific pharmacy, so you will  need to go there to get them.   [id number] - [place], [place] - [address]    [id number]  [place] [id number]    Phone: [phone]        amLODipine 10 MG tablet   atorvastatin 20 MG tablet   cloNIDine 0.2 MG tablet   clopidogrel 75 MG tablet   isosorbide mononitrate 30 MG 24 hr tablet  nitroGLYCERIN 0.4 MG SL tablet           Outstanding Labs/Studies: None  Duration of Discharge Encounter: Greater than 30 minutes including physician time.  Signed, R. Valeria Batman, PA-C 03/09/2012, 11:02 AM    I have seen, examined the patient, and reviewed the above assessment and plan.  Changes to above are made where necessary.    Co Sign: Thompson Grayer, MD 03/09/2012 3:03 PM

## 2012-03-09 NOTE — Progress Notes (Signed)
Patient Name: Victor Davis Date of Encounter: 03/09/2012     Principal Problem:  *CAD (coronary artery disease) Active Problems:  HYPERLIPIDEMIA  PERIPHERAL VASCULAR DISEASE  Mitral regurgitation  Hypertension    SUBJECTIVE: Feels well this AM. Specifically denies chest pain, sob, diaphoresis, n/v or lightheadedness. Ambulating well. No discharge at cath site, does endorse mild tenderness.    OBJECTIVE  Filed Vitals:   03/09/12 0030 03/09/12 0601 03/09/12 0700 03/09/12 0813  BP: 132/82 180/84 156/69 189/79  Pulse: 66 58 63   Temp: 97.6 F (36.4 C) 97.7 F (36.5 C) 98.2 F (36.8 C)   TempSrc: Oral Oral Oral   Resp:  16 16   Height:      Weight:      SpO2: 97% 97% 98%     Intake/Output Summary (Last 24 hours) at 03/09/12 [id number] Last data filed at 03/09/12 0600  Gross per 24 hour  Intake 1552.5 ml  Output   1650 ml  Net  -97.5 ml   Weight change:   PHYSICAL EXAM  General: Well developed, well nourished, in no acute distress. Head: Normocephalic, atraumatic, sclera non-icteric, no xanthomas, nares are without discharge.  Neck: Supple without bruits or JVD. Lungs:  Resp regular and unlabored, CTAB without wheezes, rales or rhonchi Heart: RRR no s3, s4, soft II/VI systolic murmur radiating to axilla at RLSB Abdomen: Soft, non-tender, non-distended, BS + x 4.  Msk:  Strength and tone appears normal for age. Extremities: R groin site without induration, bruit or tenderness to palpation. No clubbing, cyanosis or edema. DP/PT/Radials 2+ and equal bilaterally. Neuro: Alert and oriented X 3. Moves all extremities spontaneously. Psych: Normal affect.  LABS:  Recent Labs  [facility] 03/09/12 0615 03/06/12 1031   WBC 7.2 7.1   HGB 14.5 12.2*   HCT 43.3 37.0*   MCV 87.7 90.0   PLT 147* 133.0*    Lab 03/09/12 0615 03/06/12 1031  NA 140 137  K 4.1 4.7  CL 102 105  CO2 26 24  BUN 22 25*  CREATININE 1.10 1.4  CALCIUM 9.9 9.4  PROT -- --  BILITOT -- --    ALKPHOS -- --  ALT -- --  AST -- --  AMYLASE -- --  LIPASE -- --  GLUCOSE 110* 137*    TELE: NSR/sinus bradycardia; 50-90 bpm  ECG: sinus bradycardia, 57 bpm, no ST changes   Radiology/Studies:  Mr Abdomen W Wo Contrast  02/21/2012  *RADIOLOGY REPORT*  Clinical Data: Evaluate renal cysts  MRI ABDOMEN WITH AND WITHOUT CONTRAST  Technique:  Multiplanar multisequence MR imaging of the abdomen was performed both before and after administration of intravenous contrast.  Contrast:  7 ml of Multihance  Comparison: 01/19/2012 the  .  Findings: Complicated cyst arising from the inferior pole of the left kidney measures 4.4 x 4.5 cm.  There are areas of varying signal intensity within this structure on the precontrast T1- weighted images and the precontrast T2 weighted images.  No significant enhancement is identified within the structure following the administration of IV contrast material.  There is a single thin internal area of septation identified within the midportion of this cyst.  T1 hyperintense cyst within the upper pole of the left kidney measures 1.6 cm.  No significant enhancement within this structure. Several additional smaller hemorrhagic cysts are noted within the left kidney.  Simple appearing cyst is noted within the lower pole of the right kidney measuring 1.8 cm.  The visualized portions of the  spleen appear normal.  No suspicious liver abnormality.  The gallbladder appears within normal limits. The pancreas is normal.  There is symmetric enlargement of the adrenal gland without suspicious mass.  No upper abdominal adenopathy.  There is no ascites.  IMPRESSION:  1.  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  Mural nodularity identified on recent ultrasound corresponds to the non enhancing areas of hemorrhage.  Thin internal septation is noted.  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 I would advise recommend  imaging in 6 months to allow for resolution of blood products. 2.  Smaller hemorrhagic cysts are also noted in the left kidney. 3.  Simple appearing right renal cyst.  Original Report Authenticated By: Angelita Ingles, M.D.    Current Medications:     . amLODipine  5 mg Oral Daily  . aspirin  324 mg Oral Pre-Cath  . aspirin EC  81 mg Oral Daily  . bivalirudin      . cloNIDine  0.2 mg Oral BID  . clopidogrel      . clopidogrel  75 mg Oral Q breakfast  . diazepam  5 mg Oral On Call  . doxycycline  100 mg Oral Q12H  . fentaNYL      . heparin      . isosorbide mononitrate  30 mg Oral Daily  . lidocaine      . loratadine  10 mg Oral Daily  . midazolam      . nitroGLYCERIN      . rOPINIRole  1.5 mg Oral TID  . DISCONTD: aspirin  81 mg Oral Daily  . DISCONTD: diazepam      . DISCONTD: doxycycline  100 mg Oral BID  . DISCONTD: sodium chloride  3 mL Intravenous Q12H    ASSESSMENT AND PLAN:  1. Unstable angina/CAD- angina, abnormal EKG changes and abnormal Myoview in the office prompted repeat cath performed yesterday. He is doing well post-cath s/p PTCA/DES x 2 to LAD and LCx each. No further symptoms. Ambulating well this AM. Groin site healing well without evidence of hematoma, pseudoaneurysm or AV fistula. P2Y12 returned at 6. Will continue ASA/Plavix. BB held in setting of sinus bradycardia (HR to low 50s overnight, asymptomatic), will reassess in the office. He has an intolerance to simvastatin. Will add low-dose atorvastatin given his severe CAD (lipid panel in 12/2011 did show good lipid control). Continue Norvasc, Imdur and NTG SL PRN.   2. Hypertension- SBP 201 this AM. Typically takes antihypertensives at 6 AM each day including clonidine. Will administer now. Additionally, Norvasc 5 mg daily ordered rather than outpatient Norvasc 10mg  daily. Will adjust orders to reflect this.   3. Hyperlipidemia- stable   - Continue statin  4. PAD- stable  DISPOSITION- will plan for  discharge this morning with follow-up in the office with Dr. Angelena Form or PA/NP. BP will need to be reassessed at this time as well. This was likely in the setting of lacking antihypertensives this AM. He has several issues managed by his PCP that were not addressed this admission including hyperthyroidism, left renal mass and right pulmonary nodule which is being evaluated further and monitored, respectively. He will need to follow-up with his PCP for these issues.   Signed, R. Valeria Batman, PA-C 03/09/2012, 8:28 AM  I have seen, examined the patient, and reviewed the above assessment and plan.  Changes to above are made where necessary.  Doing well  today s/p PCI. Will discharge.  BP is elevated.  Will increase norvasc to 10mg  daily and have patient follow-up in our office within the next few weeks.  Importance of medical compliance stressed today.  Co Sign: Thompson Grayer, MD 03/09/2012 8:53 AM

## 2012-03-11 MED FILL — Dextrose Inj 5%: INTRAVENOUS | Qty: 50 | Status: AC

## 2012-03-21 ENCOUNTER — Ambulatory Visit (INDEPENDENT_AMBULATORY_CARE_PROVIDER_SITE_OTHER): Payer: Medicare Other | Admitting: Cardiovascular Disease

## 2012-03-21 ENCOUNTER — Encounter: Payer: Self-pay | Admitting: Cardiovascular Disease

## 2012-03-21 VITALS — BP 130/70 | HR 70 | Resp 12

## 2012-03-21 DIAGNOSIS — I2510 Atherosclerotic heart disease of native coronary artery without angina pectoris: Secondary | ICD-10-CM

## 2012-03-21 DIAGNOSIS — I10 Essential (primary) hypertension: Secondary | ICD-10-CM

## 2012-03-21 MED ORDER — LOVASTATIN 40 MG PO TABS: 40.0000 mg | ORAL_TABLET | Freq: Every day | ORAL | Status: DC

## 2012-03-21 NOTE — Assessment & Plan Note (Signed)
Stable s/p DES x 2 LAD, DES x 2 Circ. No med changes. DAP for one year.

## 2012-03-21 NOTE — Assessment & Plan Note (Signed)
BP is well controlled today. It has been elevated at home. He will get a new cuff as he thinks his is not working correctly. If it remains elevated at home, will call and we can change his anti-hypertensive regimen.

## 2012-03-21 NOTE — Progress Notes (Signed)
History of Present Illness:71 yo white male with history of CVA, HTN, HLD, PAD, prostate cancer who is here today for cardiac follow up. He was seen as a new patient 02/06/12 for evaluation of abnormal chest CT. His chest CT showed calcification of the aorta and coronary arteries. He described occasional chest pains that occur at rest and with exertion. No SOB. He describes being unsteady on his feet. Also described fatigue and being angry often. I arranged a left heart cath which showed severe LAD and Circumflex disease. PCI delayed because of renal insufficiency and while he was having a renal cyst workup. I brought him back on 03/08/12 and placed 2 DES in the LAD and 2 DES in the Circumflex.   He is doing well. No chest pains or SOB. BP has been up a little at home.   Primary Care Physician: Cathlean Cower    Past Medical History  Diagnosis Date  . BRADYCARDIA 10/24/2010  . CALF PAIN, RIGHT 10/22/2009  . 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 Dysuria 10/10/2010  . ERECTILE DYSFUNCTION 04/05/2007  . FATIGUE 10/02/2007  . GLUCOSE INTOLERANCE 07/15/2008  . HYPERLIPIDEMIA 07/30/2008  . HYPERSOMNIA 10/02/2007  . INSOMNIA-SLEEP DISORDER-UNSPEC 10/22/2009  . LOW BACK PAIN 04/05/2007  . OSTEOARTHRITIS 04/05/2007  . PEPTIC ULCER DISEASE 04/05/2007  . PERIPHERAL EDEMA 10/22/2009  . PERIPHERAL VASCULAR DISEASE 07/30/2008  . PROSTATE CANCER, HX OF 04/05/2007  . RESTLESS LEG SYNDROME 05/15/2007  . RHINITIS, ALLERGIC NOS 05/15/2007  . SHOULDER PAIN, LEFT 01/22/2008  . SINUSITIS- ACUTE-NOS 05/15/2007  . Unspecified essential hypertension 04/05/2007  . Unspecified visual loss 07/15/2008  . UTI 10/24/2010  . Impaired glucose tolerance 02/09/2011  . Coronary artery disease   . Shortness of breath   . GERD (gastroesophageal reflux disease)   . Cancer     PROSTATE    Past Surgical History  Procedure Date  . Inguinal herniorrhapy left 2002  . Rotator cuff repair     left  . Knee surgery   . S/p ventral surgery  2009  . S/p rad protatectomy 2002  . Right hip replacement 09/22/09  . Left hip replacement   . Eye surgery   . Cardiac stents 03/08/2012    2 LAD       2 Circumflex      DES  . Cardiac catheterization 02/12/2012    Normal left main, pLAD 40%, mLAD 95% lesion followed by 80% stenosis, moderate LCx with proximal 0% stenoisis, inferior sub-branch 90-95% stenosis, 30% proximal, mid and distal RCA, prox PL 40% stenosis.     Current Outpatient Prescriptions  Medication Sig Dispense Refill  . amLODipine (NORVASC) 10 MG tablet Take 1 tablet (10 mg total) by mouth daily.  30 tablet  3  . aspirin 81 MG EC tablet Take 81 mg by mouth daily.        [name] atorvastatin (LIPITOR) 20 MG tablet Take 1 tablet (20 mg total) by mouth daily.  30 tablet  3  . cloNIDine (CATAPRES) 0.2 MG tablet Take 1 tablet (0.2 mg total) by mouth 2 (two) times daily.  60 tablet  3  . clopidogrel (PLAVIX) 75 MG tablet Take 1 tablet (75 mg total) by mouth daily.  30 tablet  3  . doxycycline (VIBRAMYCIN) 100 MG capsule Take 100 mg by mouth 2 (two) times daily. Taking for 5 days starting 03/04/12      . isosorbide mononitrate (IMDUR) 30 MG 24 hr tablet Take 1 tablet (30 mg total) by mouth daily.  30 tablet  3  . loratadine (CLARITIN) 10 MG tablet Take 10 mg by mouth daily.      . nitroGLYCERIN (NITROSTAT) 0.4 MG SL tablet Place 1 tablet (0.4 mg total) under the tongue every 5 (five) minutes as needed for chest pain.  25 tablet  3  . rOPINIRole (REQUIP) 1 MG tablet Take 1.5 mg by mouth 3 (three) times daily.      [name] zolpidem (AMBIEN) 5 MG tablet Take 1 tablet (5 mg total) by mouth at bedtime as needed for sleep.  30 tablet  3    Allergies  Allergen Reactions  . Amitriptyline Hcl     REACTION: confusion  . Diphenhydramine Hcl Other (See Comments)    unknown  . Simvastatin     REACTION: leg pain    History   Social History  . Marital Status: Married    Spouse Name: N/A    Number of Children: 2  . Years of Education: N/A    Occupational History  . truck driver-retired    Social History Main Topics  . Smoking status: Former Smoker -- 1.0 packs/day for 15 years    Types: Cigarettes    Quit date: 02/05/1977  . Smokeless tobacco: Never Used  . Alcohol Use: 0.5 oz/week    1 drink(s) per week     RARE  . Drug Use: No  . Sexually Active: Not Currently   Other Topics Concern  . Not on file   Social History Narrative  . No narrative on file    Family History  Problem Relation Age of Onset  . Heart attack Father 66    Smoker  . Heart attack Brother 70    Review of Systems:  As stated in the HPI and otherwise negative.   BP 130/70  Pulse 70  Resp 12  Physical Examination: General: Well developed, well nourished, NAD HEENT: OP clear, mucus membranes moist SKIN: warm, dry. No rashes. Neuro: No focal deficits Musculoskeletal: Muscle strength 5/5 all ext Psychiatric: Mood and affect normal Neck: No JVD, no carotid bruits, no thyromegaly, no lymphadenopathy. Lungs:Clear bilaterally, no wheezes, rhonci, crackles Cardiovascular: Regular rate and rhythm. No murmurs, gallops or rubs. Abdomen:Soft. Bowel sounds present. Non-tender.  Extremities: No lower extremity edema. Pulses are 2 + in the bilateral DP/PT.  Cardiac cath 02/12/12:  Left main: No obstructive disease noted.  Left Anterior Descending Artery: Large caliber vessel that courses to the apex. The proximal vessel has diffuse 40% stenosis. The mid vessel has a 95% stenosis followed by 80% stenosis. There are 3 small caliber diagonal branches that arise from the mid vessel. There is a large septal perforating branch.  Circumflex Artery:Moderate sized vessel with 80% proximal stenosis. The first OM is very small. The AV groove Circumflex terminates in a moderate sized bifurcating obtuse marginal branch. The inferior sub-branch has a 90-95% stenosis.  Right Coronary Artery: Large, dominant vessel with diffuse 30% stenosis in the proximal, mid and  distal vessel. The PDA is moderate sized and patent. The PL branch is moderate sized and has a proximal 40% stenosis.  Left Ventricular Angiogram: LVEF 65-70%.

## 2012-03-21 NOTE — Patient Instructions (Addendum)
Your physician wants you to follow-up in: 6 months.  You will receive a reminder letter in the mail two months in advance. If you don't receive a letter, please call our office to schedule the follow-up appointment.  Your physician has recommended you make the following change in your medication: Stop atorvastatin. Start lovastatin 40 mg by mouth daily.  Your physician recommends that you return for fasting lab work in: 3 months--week of November 4,2013. --Lipid and Liver profile.  The lab opens at 8:30 daily  Check blood pressure at home and keep record of readings.  Call us if you would like to schedule appt to bring your blood pressure cuff in to check against readings in office.

## 2012-03-25 ENCOUNTER — Ambulatory Visit (HOSPITAL_COMMUNITY): Payer: Medicare Other

## 2012-03-26 ENCOUNTER — Emergency Department (HOSPITAL_COMMUNITY): Payer: Medicare Other

## 2012-03-26 ENCOUNTER — Telehealth: Payer: Self-pay | Admitting: Cardiovascular Disease

## 2012-03-26 ENCOUNTER — Emergency Department (HOSPITAL_COMMUNITY)
Admission: EM | Admit: 2012-03-26 | Discharge: 2012-03-26 | Disposition: A | Discharge status: Home / Self Care | Payer: Medicare Other | Attending: Emergency Medicine | Admitting: Emergency Medicine

## 2012-03-26 ENCOUNTER — Encounter (HOSPITAL_COMMUNITY): Payer: Self-pay | Admitting: Emergency Medicine

## 2012-03-26 ENCOUNTER — Other Ambulatory Visit (HOSPITAL_COMMUNITY): Payer: Medicare Other

## 2012-03-26 DIAGNOSIS — I10 Essential (primary) hypertension: Secondary | ICD-10-CM | POA: Insufficient documentation

## 2012-03-26 DIAGNOSIS — R5381 Other malaise: Secondary | ICD-10-CM | POA: Insufficient documentation

## 2012-03-26 DIAGNOSIS — R0602 Shortness of breath: Secondary | ICD-10-CM | POA: Insufficient documentation

## 2012-03-26 DIAGNOSIS — R55 Syncope and collapse: Secondary | ICD-10-CM | POA: Insufficient documentation

## 2012-03-26 DIAGNOSIS — Z79899 Other long term (current) drug therapy: Secondary | ICD-10-CM | POA: Insufficient documentation

## 2012-03-26 DIAGNOSIS — R42 Dizziness and giddiness: Secondary | ICD-10-CM | POA: Insufficient documentation

## 2012-03-26 DIAGNOSIS — I959 Hypotension, unspecified: Secondary | ICD-10-CM | POA: Diagnosis present

## 2012-03-26 LAB — BASIC METABOLIC PANEL
BUN: 26 mg/dL — ABNORMAL HIGH (ref 6–23)
CO2: 26 mEq/L (ref 19–32)
Calcium: 8.9 mg/dL (ref 8.4–10.5)
Creatinine, Ser: 1.16 mg/dL (ref 0.50–1.35)
GFR calc non Af Amer: 61 mL/min — ABNORMAL LOW (ref >90)
Glucose, Bld: 146 mg/dL — ABNORMAL HIGH (ref 70–99)

## 2012-03-26 LAB — CBC WITH DIFFERENTIAL/PLATELET
Basophils Relative: 0 % (ref 0–1)
Eosinophils Absolute: 0.1 10*3/uL (ref 0.0–0.7)
Eosinophils Relative: 2 % (ref 0–5)
Lymphs Abs: 0.8 10*3/uL (ref 0.7–4.0)
MCH: 29.2 pg (ref 26.0–34.0)
MCHC: 33.3 g/dL (ref 30.0–36.0)
MCV: 87.5 fL (ref 78.0–100.0)
Neutrophils Relative %: 78 % — ABNORMAL HIGH (ref 43–77)
Platelets: 134 10*3/uL — ABNORMAL LOW (ref 150–400)
RBC: 3.43 MIL/uL — ABNORMAL LOW (ref 4.22–5.81)

## 2012-03-26 LAB — URINALYSIS, ROUTINE W REFLEX MICROSCOPIC
Bilirubin Urine: NEGATIVE
Glucose, UA: NEGATIVE mg/dL
Ketones, ur: NEGATIVE mg/dL
Nitrite: NEGATIVE
Protein, ur: NEGATIVE mg/dL
pH: 6 (ref 5.0–8.0)

## 2012-03-26 LAB — TROPONIN I: Troponin I: <0.3 ng/mL (ref <0.30)

## 2012-03-26 MED ORDER — SODIUM CHLORIDE 0.9 % IV SOLN
1000.0000 mL | Freq: Once | INTRAVENOUS | Status: AC
  Administered 2012-03-26: 1000 mL via INTRAVENOUS

## 2012-03-26 MED ORDER — SODIUM CHLORIDE 0.9 % IV SOLN
1000.0000 mL | INTRAVENOUS | Status: DC
  Administered 2012-03-26: 1000 mL via INTRAVENOUS

## 2012-03-26 NOTE — ED Notes (Signed)
Per EMS: Pt was outside with a friend and became lightheaded, diaphoretic, and dizzy. +SOB. Pt Went home with assistance with friend. BP 100/60 for EMS laying down. Palpated SBP 70 when standing. Laying down pt's SBP increases to 100-120. No EKG changes. CBG 164.

## 2012-03-26 NOTE — Telephone Encounter (Signed)
Please return call to patient wife [phone] regarding low BP 76/46

## 2012-03-26 NOTE — Telephone Encounter (Signed)
Left pt/wife a message to call back.

## 2012-03-26 NOTE — H&P (Addendum)
CARDIOLOGY ER CONSULT  NOTE  Patient ID: Victor Davis MRN: [id number] DOB/AGE: March 30, 1941 71 y.o.  Admit date: 03/26/2012 Primary Physician   Cathlean Cower, MD Primary Cardiologist   Dr. Angelena Form Chief Complaint    Weakness  HPI:  The patient has a history of CAD with and PCI as described below on 03/08/12.  He had been doing relatively well although he does have some leg weakness daily.  Today he was standing for quite awhile outside in the heat.  He felt lightheaded.  He came back into his house and was found to have a BP of 70 at home.  He was advised to come to the ER to be evaluated.  Here his blood pressure has been fine and he denies any further symptoms.  He says that he is no longer dizzy.  The patient denies any new symptoms such as chest discomfort, neck or arm discomfort. There has been no new shortness of breath, PND or orthopnea. There have been no reported palpitations, presyncope or syncope.  EKG demonstrates no acute abnormality.  Labs are unremarkable.  He were called to evaluate further.  He denies any recent orthostatic symptoms.  He does have hyperthyroidism that has not yet been treated but he is to be treated six weeks after the cath and Dr. Jenny Reichmann is following this.  He has had a 10 lb weight loss and decreased sleep.  Of note he denies any recent evidence of GI bleeding.    Past Medical History  Diagnosis Date  . BRADYCARDIA 10/24/2010  . CALF PAIN, RIGHT 10/22/2009  . 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 Dysuria 10/10/2010  . ERECTILE DYSFUNCTION 04/05/2007  . FATIGUE 10/02/2007  . GLUCOSE INTOLERANCE 07/15/2008  . HYPERLIPIDEMIA 07/30/2008  . HYPERSOMNIA 10/02/2007  . INSOMNIA-SLEEP DISORDER-UNSPEC 10/22/2009  . LOW BACK PAIN 04/05/2007  . OSTEOARTHRITIS 04/05/2007  . PEPTIC ULCER DISEASE 04/05/2007  . PERIPHERAL EDEMA 10/22/2009  . PERIPHERAL VASCULAR DISEASE 07/30/2008  . PROSTATE CANCER, HX OF 04/05/2007  . RESTLESS LEG SYNDROME 05/15/2007  . RHINITIS, ALLERGIC NOS  05/15/2007  . SHOULDER PAIN, LEFT 01/22/2008  . SINUSITIS- ACUTE-NOS 05/15/2007  . Unspecified essential hypertension 04/05/2007  . Unspecified visual loss 07/15/2008  . UTI 10/24/2010  . Impaired glucose tolerance 02/09/2011  . Coronary artery disease      95% followed by 80% mid LAD stenosis, circumflex 80% stenosis. There was a moderate size branching obtuse marginal with 90-95% stenosis in the inferior branch. The right coronary artery had proximal 30% stenosis. Posterior lateral was moderate-sized with 40% stenosis. The EF was 65-70%. He had a DES and  x 2 to the LAD and DES x 2 to the circumflex 03/08/12.    [name] GERD (gastroesophageal reflux disease)     Past Surgical History  Procedure Date  . Inguinal herniorrhapy left 2002  . Rotator cuff repair     left  . Knee surgery   . S/p ventral surgery 2009  . S/p rad protatectomy 2002  . Right hip replacement 09/22/09  . Left hip replacement   . Eye surgery     Allergies  Allergen Reactions  . Amitriptyline Hcl     REACTION: confusion  . Diphenhydramine Hcl Other (See Comments)    Keeps him awake  . Simvastatin     REACTION: leg pain   No current facility-administered medications on file prior to encounter.   Current Outpatient Prescriptions on File Prior to Encounter  Medication Sig Dispense Refill  . amLODipine (NORVASC) 10  MG tablet Take 1 tablet (10 mg total) by mouth daily.  30 tablet  3  . aspirin 81 MG EC tablet Take 81 mg by mouth daily.        . cloNIDine (CATAPRES) 0.2 MG tablet Take 1 tablet (0.2 mg total) by mouth 2 (two) times daily.  60 tablet  3  . clopidogrel (PLAVIX) 75 MG tablet Take 1 tablet (75 mg total) by mouth daily.  30 tablet  3  . isosorbide mononitrate (IMDUR) 30 MG 24 hr tablet Take 1 tablet (30 mg total) by mouth daily.  30 tablet  3  . loratadine (CLARITIN) 10 MG tablet Take 10 mg by mouth daily.      [name] lovastatin (MEVACOR) 40 MG tablet Take 1 tablet (40 mg total) by mouth at bedtime.  30 tablet  6  .  nitroGLYCERIN (NITROSTAT) 0.4 MG SL tablet Place 1 tablet (0.4 mg total) under the tongue every 5 (five) minutes as needed for chest pain.  25 tablet  3  . rOPINIRole (REQUIP) 1 MG tablet Take 1 mg by mouth 3 (three) times daily.       [name] zolpidem (AMBIEN) 5 MG tablet Take 1 tablet (5 mg total) by mouth at bedtime as needed for sleep.  30 tablet  3   History   Social History  . Marital Status: Married    Spouse Name: N/A    Number of Children: 2  . Years of Education: N/A   Occupational History  . truck driver-retired    Social History Main Topics  . Smoking status: Former Smoker -- 1.0 packs/day for 15 years    Types: Cigarettes    Quit date: 02/05/1977  . Smokeless tobacco: Never Used  . Alcohol Use: 0.5 oz/week    1 drink(s) per week     RARE  . Drug Use: No  . Sexually Active: Not Currently   Other Topics Concern  . Not on file   Social History Narrative  . No narrative on file    Family History  Problem Relation Age of Onset  . Heart attack Father 17    Smoker  . Heart attack Brother 75     ROS:  As stated in the HPI and negative for all other systems.   Physical Exam: Blood pressure 138/72, pulse 58, temperature 97.1 F (36.2 C), temperature source Oral, resp. rate 20, SpO2 99.00%.  GENERAL:  Well appearing HEENT:  Pupils equal round and reactive, fundi not visualized, oral mucosa unremarkable NECK:  No jugular venous distention, waveform within normal limits, carotid upstroke brisk and symmetric, no bruits, no thyromegaly LYMPHATICS:  No cervical, inguinal adenopathy LUNGS:  Clear to auscultation bilaterally BACK:  No CVA tenderness CHEST:  Unremarkable HEART:  PMI not displaced or sustained,S1 and S2 within normal limits, no S3, no S4, no clicks, no rubs, no murmurs ABD:  Flat, positive bowel sounds normal in frequency in pitch, no bruits, no rebound, no guarding, no midline pulsatile mass, no hepatomegaly, no splenomegaly EXT:  2 plus pulses throughout,  no edema, no cyanosis no clubbing SKIN:  No rashes no nodules NEURO:  Cranial nerves II through XII grossly intact, motor grossly intact throughout PSYCH:  Cognitively intact, oriented to person place and time  Labs: Lab Results  Component Value Date   BUN 26* 03/26/2012   Lab Results  Component Value Date   CREATININE 1.16 03/26/2012   Lab Results  Component Value Date   NA 138 03/26/2012  K 4.3 03/26/2012   CL 105 03/26/2012   CO2 26 03/26/2012   Lab Results  Component Value Date   TROPONINI <0.30 03/26/2012   Lab Results  Component Value Date   WBC 7.1 03/26/2012   HGB 10.0* 03/26/2012   HCT 30.0* 03/26/2012   MCV 87.5 03/26/2012   PLT 134* 03/26/2012    Radiology:  CXR:  IMPRESSION: No acute cardiopulmonary disease identified.   EKG:  Sinus rhythm, rate 62, axis within normal limits, intervals within normal limits, no acute ST-T wave changes.  03/26/2012  ASSESSMENT AND PLAN:    Weakness:  Hypotension this am after standing outside in the heat for a prolonged period.  No objective findings of an acute cardiac event.  Symptoms have completely resolved and he is not hypotensive.  No indication for further work up or admission.    CAD:  Continue with medical management.  Hyperthyroidism:  Followed by Dr. Jenny Reichmann.    Anemia:  This is a new finding but there has been no evidence of recent bleeding.  I discussed this with the patient and his wife.  I discussed it with the ER MD.  He needs to be sent home with stool guaiac cards and follow up CBC in 5 - 7 days.  This can be followed by Dr. Jenny Reichmann and I will let him know about this.  SignedMinus Breeding 03/26/2012, 3:28 PM

## 2012-03-26 NOTE — ED Notes (Signed)
Orthostatic vitals performed by Nira Conn, EMT

## 2012-03-26 NOTE — Telephone Encounter (Signed)
Wife states pt was sitting talking to [place] when he started having cold sweats, like almost passing out. Very weak. B/P 76/46 pulse 63 beats/minute at 11:30 now his BP is 82/44 pulse 59 beats/minute. Wife states he continue being very weak. Pt's wife aware that she need to call EMS to check him out to see if he needs to go to the ER. Wife verbalized understanding. Patient was taken to the ER By EMS. Pt just arrived to ER Per wife.

## 2012-03-26 NOTE — ED Provider Notes (Addendum)
History     CSN: [id number]  Arrival date & time 03/26/12  1213   First MD Initiated Contact with Patient 03/26/12 1300      Chief Complaint  Patient presents with  . Near Syncope    (Consider location/radiation/quality/duration/timing/severity/associated sxs/prior treatment) HPI Comments: 71 yo white male with history of CVA, HTN, HLD, PAD, prostate cancer comes in with cc of hypotension. Pt had a recent PCI placed. Pt's wife reports that this am, patient was out with his friend, and he got really weak. There was no associated chest pain, sob, no n/v/f/c. Pt's wife took his BP, and it was low - they called the Cardiologist - and were advised to come to the ED. Pt was profoundly weak at that time, and he felt he was going to pass out. No previous hx of similar complains. No new BP meds.   The history is provided by the patient and the spouse.    Past Medical History  Diagnosis Date  . BRADYCARDIA 10/24/2010  . CALF PAIN, RIGHT 10/22/2009  . 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 Dysuria 10/10/2010  . ERECTILE DYSFUNCTION 04/05/2007  . FATIGUE 10/02/2007  . GLUCOSE INTOLERANCE 07/15/2008  . HYPERLIPIDEMIA 07/30/2008  . HYPERSOMNIA 10/02/2007  . INSOMNIA-SLEEP DISORDER-UNSPEC 10/22/2009  . LOW BACK PAIN 04/05/2007  . OSTEOARTHRITIS 04/05/2007  . PEPTIC ULCER DISEASE 04/05/2007  . PERIPHERAL EDEMA 10/22/2009  . PERIPHERAL VASCULAR DISEASE 07/30/2008  . PROSTATE CANCER, HX OF 04/05/2007  . RESTLESS LEG SYNDROME 05/15/2007  . RHINITIS, ALLERGIC NOS 05/15/2007  . SHOULDER PAIN, LEFT 01/22/2008  . SINUSITIS- ACUTE-NOS 05/15/2007  . Unspecified essential hypertension 04/05/2007  . Unspecified visual loss 07/15/2008  . UTI 10/24/2010  . Impaired glucose tolerance 02/09/2011  . Coronary artery disease   . Shortness of breath   . GERD (gastroesophageal reflux disease)   . Cancer     PROSTATE    Past Surgical History  Procedure Date  . Inguinal herniorrhapy left 2002  . Rotator cuff repair     left  . Knee surgery   . S/p ventral surgery 2009  . S/p rad protatectomy 2002  . Right hip replacement 09/22/09  . Left hip replacement   . Eye surgery   . Cardiac stents 03/08/2012    2 LAD       2 Circumflex      DES  . Cardiac catheterization 02/12/2012    Normal left main, pLAD 40%, mLAD 95% lesion followed by 80% stenosis, moderate LCx with proximal 0% stenoisis, inferior sub-branch 90-95% stenosis, 30% proximal, mid and distal RCA, prox PL 40% stenosis.     Family History  Problem Relation Age of Onset  . Heart attack Father 36    Smoker  . Heart attack Brother 42    History  Substance Use Topics  . Smoking status: Former Smoker -- 1.0 packs/day for 15 years    Types: Cigarettes    Quit date: 02/05/1977  . Smokeless tobacco: Never Used  . Alcohol Use: 0.5 oz/week    1 drink(s) per week     RARE      Review of Systems  Constitutional: Negative for fever, chills and activity change.  HENT: Negative for neck pain.   Eyes: Negative for visual disturbance.  Respiratory: Positive for shortness of breath. Negative for cough and chest tightness.   Cardiovascular: Negative for chest pain.  Gastrointestinal: Negative for abdominal distention.  Genitourinary: Negative for dysuria, enuresis and difficulty urinating.  Musculoskeletal: Negative for arthralgias.  Neurological:  Positive for dizziness, weakness and light-headedness. Negative for syncope and headaches.  Psychiatric/Behavioral: Negative for confusion.    Allergies  Amitriptyline hcl; Diphenhydramine hcl; and Simvastatin  Home Medications   Current Outpatient Rx  Name Route Sig Dispense Refill  . AMLODIPINE BESYLATE 10 MG PO TABS Oral Take 1 tablet (10 mg total) by mouth daily. 30 tablet 3  . ASPIRIN 81 MG PO TBEC Oral Take 81 mg by mouth daily.      [name] CLONIDINE HCL 0.2 MG PO TABS Oral Take 1 tablet (0.2 mg total) by mouth 2 (two) times daily. 60 tablet 3  . CLOPIDOGREL BISULFATE 75 MG PO TABS Oral Take 1  tablet (75 mg total) by mouth daily. 30 tablet 3  . DOXYCYCLINE HYCLATE 100 MG PO CAPS Oral Take 100 mg by mouth 2 (two) times daily. Taking for 5 days starting 03/04/12    . ISOSORBIDE MONONITRATE ER 30 MG PO TB24 Oral Take 1 tablet (30 mg total) by mouth daily. 30 tablet 3  . LORATADINE 10 MG PO TABS Oral Take 10 mg by mouth daily.    [name] LOVASTATIN 40 MG PO TABS Oral Take 1 tablet (40 mg total) by mouth at bedtime. 30 tablet 6  . NITROGLYCERIN 0.4 MG SL SUBL Sublingual Place 1 tablet (0.4 mg total) under the tongue every 5 (five) minutes as needed for chest pain. 25 tablet 3  . ROPINIROLE HCL 1 MG PO TABS Oral Take 1.5 mg by mouth 3 (three) times daily.    [name] ZOLPIDEM TARTRATE 5 MG PO TABS Oral Take 1 tablet (5 mg total) by mouth at bedtime as needed for sleep. 30 tablet 3    BP 127/69  Pulse 69  Temp 97.1 F (36.2 C) (Oral)  Resp 20  SpO2 97%  Physical Exam  Constitutional: He is oriented to person, place, and time. He appears well-developed.  HENT:  Head: Normocephalic and atraumatic.  Eyes: Conjunctivae and EOM are normal. Pupils are equal, round, and reactive to light.  Neck: Normal range of motion. Neck supple. No JVD present.  Cardiovascular: Normal rate and regular rhythm.   Pulmonary/Chest: Effort normal and breath sounds normal.  Abdominal: Soft. Bowel sounds are normal. He exhibits no distension. There is no tenderness. There is no rebound and no guarding.  Neurological: He is alert and oriented to person, place, and time.  Skin: Skin is warm.    ED Course  Procedures (including critical care time)   Labs Reviewed  BASIC METABOLIC PANEL  CBC WITH DIFFERENTIAL  TROPONIN I  URINALYSIS, ROUTINE W REFLEX MICROSCOPIC   No results found.   No diagnosis found.    MDM  DDx includes: Orthostatic hypotension Stroke Vertebral artery dissection/stenosis Dysrhythmia PE Vasovagal/neurocardiogenic syncope Aortic stenosis Valvular disorder/Cardiomyopathy Anemia  Pt  comes in with cc of low BP. Pt has CAD, no CHF. He is on multiple antihypertensive, no new ones. He has no increased loss of fluids, no concerns for heat stroke clinically or even sepsis. Will check orthostatics. Will get Cardiac workup including EKG. If everything comes back normal, and the BP is WNL (which it has been without any fluids in the ED), we will consider discharge. We will speak with Cardiologist about the BP meds, and to see if anything needs to be discontinued.    Date: 03/26/2012  Rate: 62  Rhythm: normal sinus rhythm  QRS Axis: normal  Intervals: normal  ST/T Wave abnormalities: nonspecific ST changes  Conduction Disutrbances:none  Narrative Interpretation:  Old EKG Reviewed: unchanged    Varney Biles, MD 03/26/12 1412  Cardiology clears patient for discharge. We will d/c patient when trop x 2 is neg. Also, Hb is low - pt did have recent surgery. Denies any bleed on hx, but we will get FOBT.  Varney Biles, MD 03/26/12 [phone]

## 2012-03-27 ENCOUNTER — Telehealth: Payer: Self-pay

## 2012-03-27 DIAGNOSIS — D649 Anemia, unspecified: Secondary | ICD-10-CM

## 2012-03-27 NOTE — Telephone Encounter (Signed)
Called left message to call back 

## 2012-03-27 NOTE — Telephone Encounter (Signed)
Message copied by Jamesetta Orleans on Wed Mar 27, 2012  1:53 PM ------      Message from: Biagio Borg      Created: Wed Mar 27, 2012 12:34 PM       Can be done at [facility] if he wants      ----- Message -----         From: Donell Sievert Zyrah Wiswell         Sent: 03/27/2012   8:12 AM           To: Biagio Borg, MD            The patients wife informed this am the hospital stated he would need his stool checked.

## 2012-03-27 NOTE — Telephone Encounter (Signed)
Patient's wife informed

## 2012-03-27 NOTE — Telephone Encounter (Signed)
Called the patients wife informed to bring to the lab in 5 days. She agreed to do so. Put order in to the lab.

## 2012-04-01 ENCOUNTER — Other Ambulatory Visit (INDEPENDENT_AMBULATORY_CARE_PROVIDER_SITE_OTHER): Payer: Medicare Other

## 2012-04-01 ENCOUNTER — Encounter: Payer: Self-pay | Admitting: Internal Medicine

## 2012-04-01 ENCOUNTER — Ambulatory Visit (INDEPENDENT_AMBULATORY_CARE_PROVIDER_SITE_OTHER)
Admission: RE | Admit: 2012-04-01 | Discharge: 2012-04-01 | Disposition: A | Discharge status: Home / Self Care | Payer: Medicare Other | Source: Ambulatory Visit | Attending: Internal Medicine | Admitting: Internal Medicine

## 2012-04-01 DIAGNOSIS — D649 Anemia, unspecified: Secondary | ICD-10-CM

## 2012-04-01 DIAGNOSIS — J439 Emphysema, unspecified: Secondary | ICD-10-CM | POA: Insufficient documentation

## 2012-04-01 DIAGNOSIS — R911 Solitary pulmonary nodule: Secondary | ICD-10-CM

## 2012-04-01 HISTORY — DX: Emphysema, unspecified: J43.9

## 2012-04-01 LAB — CBC WITH DIFFERENTIAL/PLATELET
Basophils Absolute: 0 10*3/uL (ref 0.0–0.1)
HCT: 30.5 % — ABNORMAL LOW (ref 39.0–52.0)
Lymphs Abs: 1.7 10*3/uL (ref 0.7–4.0)
MCHC: 33.6 g/dL (ref 30.0–36.0)
MCV: 90 fl (ref 78.0–100.0)
Monocytes Absolute: 0.5 10*3/uL (ref 0.1–1.0)
Platelets: 190 10*3/uL (ref 150.0–400.0)
RDW: 13.3 % (ref 11.5–14.6)

## 2012-04-02 ENCOUNTER — Encounter: Payer: Self-pay | Admitting: Endocrinology

## 2012-04-02 ENCOUNTER — Encounter: Payer: Self-pay | Admitting: Internal Medicine

## 2012-04-02 ENCOUNTER — Other Ambulatory Visit (INDEPENDENT_AMBULATORY_CARE_PROVIDER_SITE_OTHER): Payer: Medicare Other

## 2012-04-02 ENCOUNTER — Ambulatory Visit (INDEPENDENT_AMBULATORY_CARE_PROVIDER_SITE_OTHER): Payer: Medicare Other | Admitting: Internal Medicine

## 2012-04-02 ENCOUNTER — Ambulatory Visit (INDEPENDENT_AMBULATORY_CARE_PROVIDER_SITE_OTHER): Payer: Medicare Other | Admitting: Endocrinology

## 2012-04-02 VITALS — BP 138/70 | HR 70 | Temp 97.6°F | Ht 67.0 in | Wt 148.0 lb

## 2012-04-02 VITALS — BP 104/60 | HR 61 | Temp 97.5°F | Ht 68.0 in | Wt 147.4 lb

## 2012-04-02 DIAGNOSIS — E0590 Thyrotoxicosis, unspecified without thyrotoxic crisis or storm: Secondary | ICD-10-CM

## 2012-04-02 DIAGNOSIS — N2889 Other specified disorders of kidney and ureter: Secondary | ICD-10-CM

## 2012-04-02 DIAGNOSIS — N289 Disorder of kidney and ureter, unspecified: Secondary | ICD-10-CM

## 2012-04-02 DIAGNOSIS — R911 Solitary pulmonary nodule: Secondary | ICD-10-CM

## 2012-04-02 DIAGNOSIS — I10 Essential (primary) hypertension: Secondary | ICD-10-CM

## 2012-04-02 DIAGNOSIS — D649 Anemia, unspecified: Secondary | ICD-10-CM

## 2012-04-02 LAB — TSH: TSH: 7.99 u[IU]/mL — ABNORMAL HIGH (ref 0.35–5.50)

## 2012-04-02 NOTE — Progress Notes (Signed)
Subjective:    Patient ID: Victor Davis, male    DOB: 03/21/1941, 71 y.o.   MRN: [id number]  HPI Pt was noted 3 1/2 mos ago to have hyperthyroidism.  Scan was planned, but had to be delayed due to coronary stents being placed.  It is now scheduled for next month.  He denies chest pain, but he has slight anxiety, insomnia, and tremor. Past Medical History  Diagnosis Date  . BRADYCARDIA 10/24/2010  . CALF PAIN, RIGHT 10/22/2009  . 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 Dysuria 10/10/2010  . ERECTILE DYSFUNCTION 04/05/2007  . FATIGUE 10/02/2007  . GLUCOSE INTOLERANCE 07/15/2008  . HYPERLIPIDEMIA 07/30/2008  . HYPERSOMNIA 10/02/2007  . INSOMNIA-SLEEP DISORDER-UNSPEC 10/22/2009  . LOW BACK PAIN 04/05/2007  . OSTEOARTHRITIS 04/05/2007  . PEPTIC ULCER DISEASE 04/05/2007  . PERIPHERAL EDEMA 10/22/2009  . PERIPHERAL VASCULAR DISEASE 07/30/2008  . PROSTATE CANCER, HX OF 04/05/2007  . RESTLESS LEG SYNDROME 05/15/2007  . RHINITIS, ALLERGIC NOS 05/15/2007  . SHOULDER PAIN, LEFT 01/22/2008  . SINUSITIS- ACUTE-NOS 05/15/2007  . Unspecified essential hypertension 04/05/2007  . Unspecified visual loss 07/15/2008  . UTI 10/24/2010  . Impaired glucose tolerance 02/09/2011  . Coronary artery disease      95% followed by 80% mid LAD stenosis, circumflex 80% stenosis. There was a moderate size branching obtuse marginal with 90-95% stenosis in the inferior branch. The right coronary artery had proximal 30% stenosis. Posterior lateral was moderate-sized with 40% stenosis. The EF was 65-70%. He had a DES and  x 2 to the LAD and DES x 2 to the circumflex 03/08/12.    [name] GERD (gastroesophageal reflux disease)   . Emphysema 04/01/2012    By CT chest, august 2013    Past Surgical History  Procedure Date  . Inguinal herniorrhapy left 2002  . Rotator cuff repair     left  . Knee surgery   . S/p ventral surgery 2009  . S/p rad protatectomy 2002  . Right hip replacement 09/22/09  . Left hip replacement   . Eye surgery      History   Social History  . Marital Status: Married    Spouse Name: N/A    Number of Children: 2  . Years of Education: N/A   Occupational History  . truck driver-retired    Social History Main Topics  . Smoking status: Former Smoker -- 1.0 packs/day for 15 years    Types: Cigarettes    Quit date: 02/05/1977  . Smokeless tobacco: Never Used  . Alcohol Use: 0.5 oz/week    1 drink(s) per week     RARE  . Drug Use: No  . Sexually Active: Not Currently   Other Topics Concern  . Not on file   Social History Narrative  . No narrative on file    Current Outpatient Prescriptions on File Prior to Visit  Medication Sig Dispense Refill  . amLODipine (NORVASC) 10 MG tablet Take 1 tablet (10 mg total) by mouth daily.  30 tablet  3  . aspirin 81 MG EC tablet Take 81 mg by mouth daily.        . cloNIDine (CATAPRES) 0.2 MG tablet Take 1 tablet (0.2 mg total) by mouth 2 (two) times daily.  60 tablet  3  . clopidogrel (PLAVIX) 75 MG tablet Take 1 tablet (75 mg total) by mouth daily.  30 tablet  3  . ibuprofen (ADVIL,MOTRIN) 200 MG tablet Take 400 mg by mouth every 6 (six) hours as needed. pain      .  isosorbide mononitrate (IMDUR) 30 MG 24 hr tablet Take 1 tablet (30 mg total) by mouth daily.  30 tablet  3  . loratadine (CLARITIN) 10 MG tablet Take 10 mg by mouth daily.      [name] lovastatin (MEVACOR) 40 MG tablet Take 1 tablet (40 mg total) by mouth at bedtime.  30 tablet  6  . Menthol-Methyl Salicylate (MUSCLE RUB) 10-15 % CREA Apply 1 application topically as needed. To arm for pain      . nitroGLYCERIN (NITROSTAT) 0.4 MG SL tablet Place 1 tablet (0.4 mg total) under the tongue every 5 (five) minutes as needed for chest pain.  25 tablet  3  . rOPINIRole (REQUIP) 1 MG tablet Take 1 mg by mouth 3 (three) times daily.       [name] zolpidem (AMBIEN) 5 MG tablet Take 1 tablet (5 mg total) by mouth at bedtime as needed for sleep.  30 tablet  3    Allergies  Allergen Reactions  . Amitriptyline  Hcl     REACTION: confusion  . Diphenhydramine Hcl Other (See Comments)    Keeps him awake  . Simvastatin     REACTION: leg pain    Family History  Problem Relation Age of Onset  . Heart attack Father 67    Smoker  . Heart attack Brother 70  no goiter or other thyroid problem  BP 138/70  Pulse 70  Temp 97.6 F (36.4 C) (Oral)  Ht 5\' 7"  (1.702 m)  Wt 148 lb (67.132 kg)  BMI 23.18 kg/m2  SpO2 95%  Review of Systems Denies fever.    Objective:   Physical Exam VITAL SIGNS:  See vs page GENERAL: no distress NECK: There is no palpable thyroid enlargement.  No thyroid nodule is palpable.  No palpable lymphadenopathy at the anterior neck.  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

## 2012-04-02 NOTE — Patient Instructions (Addendum)
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

## 2012-04-02 NOTE — Patient Instructions (Addendum)
Please recheck the thyroid blood tests today.  i'll let you know about the results. If the thyroid is overactive again, please plan to do the scan as scheduled. let's check a thyroid "scan" (a special, but easy and painless type of thyroid x ray).  It works like this: you go to the x-ray department of the hospital to swallow a pill, which contains a miniscule amount of radiation.  You will not notice any symptoms from this.  You will go back to the x-ray department the next day, to lie down in front of a camera.  The results of this will be sent to me.   Based on the results, i hope to order for you a treatment pill of radioactive iodine.  Although it is a larger amount of radiation, you will again notice no symptoms from this.  The pill is gone from your body in a few days (during which you should stay away from other people), but takes several months to work.  Therefore, please return here approximately 6-8 weeks after the treatment.  This treatment has been available for many years, and the only known side-effect is an underactive thyroid.  It is possible that i would eventually prescribe for you a thyroid hormone pill, which is very inexpensive.  You don't have to worry about side-effects of this thyroid hormone pill, because it is the same molecule your thyroid makes.     Hyperthyroidism The thyroid is a large gland located in the lower front part of your neck. The thyroid helps control metabolism. Metabolism is how your body uses food. It controls metabolism with the hormone thyroxine. When the thyroid is overactive, it produces too much hormone. When this happens, these following problems may occur:    Nervousness   Heat intolerance   Weight loss (in spite of increase food intake)   Diarrhea   Change in hair or skin texture   Palpitations (heart skipping or having extra beats)   Tachycardia (rapid heart rate)   Loss of menstruation (amenorrhea)   Shaking of the hands   CAUSES  Grave's Disease (the immune system attacks the thyroid gland). This is the most common cause.   Inflammation of the thyroid gland.   Tumor (usually benign) in the thyroid gland or elsewhere.   Excessive use of thyroid medications (both prescription and 'natural').   Excessive ingestion of Iodine.  DIAGNOSIS   To prove hyperthyroidism, your caregiver may do blood tests and ultrasound tests. Sometimes the signs are hidden. It may be necessary for your caregiver to watch this illness with blood tests, either before or after diagnosis and treatment. TREATMENT Short-term treatment There are several treatments to control symptoms. Drugs called beta blockers may give some relief. Drugs that decrease hormone production will provide temporary relief in many people. These measures will usually not give permanent relief. Definitive therapy There are treatments available which can be discussed between you and your caregiver which will permanently treat the problem. These treatments range from surgery (removal of the thyroid), to the use of radioactive iodine (destroys the thyroid by radiation), to the use of antithyroid drugs (interfere with hormone synthesis). The first two treatments are permanent and usually successful. They most often require hormone replacement therapy for life. This is because it is impossible to remove or destroy the exact amount of thyroid required to make a person euthyroid (normal). HOME CARE INSTRUCTIONS   See your caregiver if the problems you are being treated for get worse. Examples of this  would be the problems listed above. SEEK MEDICAL CARE IF: Your general condition worsens. MAKE SURE YOU:    Understand these instructions.   Will watch your condition.   Will get help right away if you are not doing well or get worse.  Document Released: 07/31/2005 Document Revised: 07/20/2011 Document Reviewed: 12/12/2006 [facility] Patient Information 2012 [place].

## 2012-04-02 NOTE — Assessment & Plan Note (Signed)
Benign appearance on CT June 2013, for f/u at one yr or sooner if symptoms dictate

## 2012-04-02 NOTE — Assessment & Plan Note (Signed)
stable overall by hx and exam, most recent data reviewed with pt, and pt to continue medical treatment as before BP Readings from Last 3 Encounters:  04/02/12 138/70  04/02/12 104/60  03/26/12 162/79

## 2012-04-02 NOTE — Assessment & Plan Note (Signed)
Felt Benign apparently per urology, for f/u as planned - oct 2013

## 2012-04-02 NOTE — Progress Notes (Signed)
Subjective:    Patient ID: Victor Davis, male    DOB: 11-09-1940, 71 y.o.   MRN: [id number]  HPI  Here to f/u with recent complicated hx. Had recent stenting per Dr Angelena Form July 26, with succesful result, felt stable on f/u aug 8.  Then seen in ER per Dr Hochrein/card with low BP, med adjusted and done better since then though pt states still somewhat labile;  New anemia noted at that time aug 13 with hgb 10.3; f/u hgb yesterday 10.9 without overt bleeding or bruising.  Remains on plavix.  Has not done any stool cards but here for DRE and further evaluation.  Denies worsening reflux, dysphagia, abd pain, n/v, bowel change or blood.  Last colonscopy 2004.  Complicating matters is new hyperthyroidism with recent wt loss and sleep difficulty, with thyroid scan having to be pushed back to 6 wks after stenting due to dye exposure.  Did have transient renal dysfxn due to dye recent, high of 1.6, with last cr 1.1.    Does not currently have appt with Dr Loanne Drilling for eval/tx as I had ordered previously.  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  Also has pulm nodule RLL with recent CT June 2013, likely benign appearance with rec'd f/u CT at one yr (june 2014).  Wife remains supportive, has been instrumental in good compliance with appointments and meds.  Pt denies other complaints today Past Medical History  Diagnosis Date  . BRADYCARDIA 10/24/2010  . CALF PAIN, RIGHT 10/22/2009  . 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 Dysuria 10/10/2010  . ERECTILE DYSFUNCTION 04/05/2007  . FATIGUE 10/02/2007  . GLUCOSE INTOLERANCE 07/15/2008  . HYPERLIPIDEMIA 07/30/2008  . HYPERSOMNIA 10/02/2007  . INSOMNIA-SLEEP DISORDER-UNSPEC 10/22/2009  . LOW BACK PAIN 04/05/2007  . OSTEOARTHRITIS 04/05/2007  . PEPTIC ULCER DISEASE 04/05/2007  . PERIPHERAL EDEMA 10/22/2009  . PERIPHERAL VASCULAR DISEASE 07/30/2008  . PROSTATE CANCER, HX OF 04/05/2007  . RESTLESS LEG SYNDROME 05/15/2007  . RHINITIS,  ALLERGIC NOS 05/15/2007  . SHOULDER PAIN, LEFT 01/22/2008  . SINUSITIS- ACUTE-NOS 05/15/2007  . Unspecified essential hypertension 04/05/2007  . Unspecified visual loss 07/15/2008  . UTI 10/24/2010  . Impaired glucose tolerance 02/09/2011  . Coronary artery disease      95% followed by 80% mid LAD stenosis, circumflex 80% stenosis. There was a moderate size branching obtuse marginal with 90-95% stenosis in the inferior branch. The right coronary artery had proximal 30% stenosis. Posterior lateral was moderate-sized with 40% stenosis. The EF was 65-70%. He had a DES and  x 2 to the LAD and DES x 2 to the circumflex 03/08/12.    [name] GERD (gastroesophageal reflux disease)   . Emphysema 04/01/2012    By CT chest, august 2013   Past Surgical History  Procedure Date  . Inguinal herniorrhapy left 2002  . Rotator cuff repair     left  . Knee surgery   . S/p ventral surgery 2009  . S/p rad protatectomy 2002  . Right hip replacement 09/22/09  . Left hip replacement   . Eye surgery     reports that he quit smoking about 35 years ago. His smoking use included Cigarettes. He has a 15 pack-year smoking history. He has never used smokeless tobacco. He reports that he drinks about .5 ounces of alcohol per week. He reports that he does not use illicit drugs. family history includes Heart attack (age of onset:54) in his father and Heart attack (age  of onset:70) in his brother. Allergies  Allergen Reactions  . Amitriptyline Hcl     REACTION: confusion  . Diphenhydramine Hcl Other (See Comments)    Keeps him awake  . Simvastatin     REACTION: leg pain   Current Outpatient Prescriptions on File Prior to Visit  Medication Sig Dispense Refill  . amLODipine (NORVASC) 10 MG tablet Take 1 tablet (10 mg total) by mouth daily.  30 tablet  3  . aspirin 81 MG EC tablet Take 81 mg by mouth daily.        . cloNIDine (CATAPRES) 0.2 MG tablet Take 1 tablet (0.2 mg total) by mouth 2 (two) times daily.  60 tablet  3  .  clopidogrel (PLAVIX) 75 MG tablet Take 1 tablet (75 mg total) by mouth daily.  30 tablet  3  . ibuprofen (ADVIL,MOTRIN) 200 MG tablet Take 400 mg by mouth every 6 (six) hours as needed. pain      . isosorbide mononitrate (IMDUR) 30 MG 24 hr tablet Take 1 tablet (30 mg total) by mouth daily.  30 tablet  3  . loratadine (CLARITIN) 10 MG tablet Take 10 mg by mouth daily.      [name] lovastatin (MEVACOR) 40 MG tablet Take 1 tablet (40 mg total) by mouth at bedtime.  30 tablet  6  . Menthol-Methyl Salicylate (MUSCLE RUB) 10-15 % CREA Apply 1 application topically as needed. To arm for pain      . nitroGLYCERIN (NITROSTAT) 0.4 MG SL tablet Place 1 tablet (0.4 mg total) under the tongue every 5 (five) minutes as needed for chest pain.  25 tablet  3  . rOPINIRole (REQUIP) 1 MG tablet Take 1 mg by mouth 3 (three) times daily.       [name] zolpidem (AMBIEN) 5 MG tablet Take 1 tablet (5 mg total) by mouth at bedtime as needed for sleep.  30 tablet  3   Review of Systems Review of Systems  Constitutional: Negative for diaphoresis and unexpected weight change.  HENT: Negative for drooling and tinnitus.   Eyes: Negative for photophobia and visual disturbance.  Respiratory: Negative for choking and stridor.   Gastrointestinal: Negative for vomiting and blood in stool.  Genitourinary: Negative for hematuria and decreased urine volume.  Musculoskeletal: Negative for gait problem.  Skin: Negative for color change and wound.  Neurological: Negative for tremors and numbness.  Psychiatric/Behavioral: Negative for decreased concentration. The patient is not hyperactive.       Objective:   Physical Exam BP 104/60  Pulse 61  Temp 97.5 F (36.4 C) (Oral)  Ht 5\' 8"  (1.727 m)  Wt 147 lb 6 oz (66.849 kg)  BMI 22.41 kg/m2  SpO2 98% Physical Exam  VS noted Constitutional: Pt appears well-developed and well-nourished.  HENT: Head: Normocephalic.  Right Ear: External ear normal.  Left Ear: External ear normal.  Eyes:  Conjunctivae and EOM are normal. Pupils are equal, round, and reactive to light.  Neck: Normal range of motion. Neck supple.  Cardiovascular: Normal rate and regular rhythm.   Pulmonary/Chest: Effort normal and breath sounds normal.  Abd:  Soft, NT, non-distended, + BS DRE:  Nontender, prostate 1+, no nodule, no mass, Heme Neg Neurological: Pt is alert. Not confused Skin: Skin is warm. No erythema. No rash Psychiatric: Pt behavior is normal. Thought content normal.     Assessment & Plan:

## 2012-04-02 NOTE — Assessment & Plan Note (Signed)
For appt later today with Dr Loanne Drilling, I have arranged

## 2012-04-02 NOTE — Assessment & Plan Note (Addendum)
DRE heme neg today, no overt bleeding or bruising, hgb recent stable aug 19 compared to aug 13,  Will re-check next wk with iron/b12 as well (declines further lab today), may need GI evaluation, to start iron 325 in the meantime  Note: total time for pt hx, exam, review of record with pt and wife in room, determination of diagnoses, and plan for futther eval and tx is > 40 min

## 2012-04-12 ENCOUNTER — Other Ambulatory Visit (INDEPENDENT_AMBULATORY_CARE_PROVIDER_SITE_OTHER): Payer: Medicare Other

## 2012-04-12 ENCOUNTER — Encounter: Payer: Self-pay | Admitting: Internal Medicine

## 2012-04-12 DIAGNOSIS — D649 Anemia, unspecified: Secondary | ICD-10-CM

## 2012-04-12 DIAGNOSIS — I10 Essential (primary) hypertension: Secondary | ICD-10-CM

## 2012-04-12 LAB — CBC WITH DIFFERENTIAL/PLATELET
Basophils Absolute: 0 10*3/uL (ref 0.0–0.1)
Eosinophils Absolute: 0.3 10*3/uL (ref 0.0–0.7)
Lymphocytes Relative: 25.3 % (ref 12.0–46.0)
MCHC: 33 g/dL (ref 30.0–36.0)
Monocytes Absolute: 0.6 10*3/uL (ref 0.1–1.0)
Neutro Abs: 3.5 10*3/uL (ref 1.4–7.7)
Neutrophils Relative %: 59.2 % (ref 43.0–77.0)
RDW: 13.2 % (ref 11.5–14.6)

## 2012-04-12 LAB — HEPATIC FUNCTION PANEL
ALT: 31 U/L (ref 0–53)
Albumin: 3.4 g/dL — ABNORMAL LOW (ref 3.5–5.2)
Alkaline Phosphatase: 81 U/L (ref 39–117)
Total Protein: 5.9 g/dL — ABNORMAL LOW (ref 6.0–8.3)

## 2012-04-12 LAB — BASIC METABOLIC PANEL
Chloride: 106 mEq/L (ref 96–112)
Creatinine, Ser: 1.1 mg/dL (ref 0.4–1.5)
GFR: 69.82 mL/min (ref >60.00)

## 2012-04-12 LAB — IBC PANEL
Saturation Ratios: 35.9 % (ref 20.0–50.0)
Transferrin: 240.9 mg/dL (ref 212.0–360.0)

## 2012-04-22 ENCOUNTER — Ambulatory Visit (HOSPITAL_COMMUNITY): Payer: Medicare Other

## 2012-04-23 ENCOUNTER — Other Ambulatory Visit (HOSPITAL_COMMUNITY): Payer: Medicare Other

## 2012-05-03 ENCOUNTER — Ambulatory Visit (INDEPENDENT_AMBULATORY_CARE_PROVIDER_SITE_OTHER): Payer: Medicare Other | Admitting: Endocrinology

## 2012-05-03 ENCOUNTER — Encounter: Payer: Self-pay | Admitting: Endocrinology

## 2012-05-03 ENCOUNTER — Other Ambulatory Visit (INDEPENDENT_AMBULATORY_CARE_PROVIDER_SITE_OTHER): Payer: Medicare Other

## 2012-05-03 VITALS — BP 150/78 | HR 78 | Temp 97.8°F | Wt 150.5 lb

## 2012-05-03 DIAGNOSIS — E0590 Thyrotoxicosis, unspecified without thyrotoxic crisis or storm: Secondary | ICD-10-CM

## 2012-05-03 LAB — TSH: TSH: 6.49 u[IU]/mL — ABNORMAL HIGH (ref 0.35–5.50)

## 2012-05-03 NOTE — Patient Instructions (Addendum)
blood tests are being requested for you today.  You will receive a letter with results.

## 2012-05-03 NOTE — Progress Notes (Signed)
Subjective:    Patient ID: Victor Davis, male    DOB: Feb 18, 1941, 71 y.o.   MRN: [id number]  HPI Pt was seen here 1 month ago for low tsh which spontaneously evolved into slightly high tsh.  Dx was presumed to be subacute (viral) thyroiditis.  pt states he feels no different, and well in general. Past Medical History  Diagnosis Date  . BRADYCARDIA 10/24/2010  . CALF PAIN, RIGHT 10/22/2009  . 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 Dysuria 10/10/2010  . ERECTILE DYSFUNCTION 04/05/2007  . FATIGUE 10/02/2007  . GLUCOSE INTOLERANCE 07/15/2008  . HYPERLIPIDEMIA 07/30/2008  . HYPERSOMNIA 10/02/2007  . INSOMNIA-SLEEP DISORDER-UNSPEC 10/22/2009  . LOW BACK PAIN 04/05/2007  . OSTEOARTHRITIS 04/05/2007  . PEPTIC ULCER DISEASE 04/05/2007  . PERIPHERAL EDEMA 10/22/2009  . PERIPHERAL VASCULAR DISEASE 07/30/2008  . PROSTATE CANCER, HX OF 04/05/2007  . RESTLESS LEG SYNDROME 05/15/2007  . RHINITIS, ALLERGIC NOS 05/15/2007  . SHOULDER PAIN, LEFT 01/22/2008  . SINUSITIS- ACUTE-NOS 05/15/2007  . Unspecified essential hypertension 04/05/2007  . Unspecified visual loss 07/15/2008  . UTI 10/24/2010  . Impaired glucose tolerance 02/09/2011  . Coronary artery disease      95% followed by 80% mid LAD stenosis, circumflex 80% stenosis. There was a moderate size branching obtuse marginal with 90-95% stenosis in the inferior branch. The right coronary artery had proximal 30% stenosis. Posterior lateral was moderate-sized with 40% stenosis. The EF was 65-70%. He had a DES and  x 2 to the LAD and DES x 2 to the circumflex 03/08/12.    [name] GERD (gastroesophageal reflux disease)   . Emphysema 04/01/2012    By CT chest, august 2013    Past Surgical History  Procedure Date  . Inguinal herniorrhapy left 2002  . Rotator cuff repair     left  . Knee surgery   . S/p ventral surgery 2009  . S/p rad protatectomy 2002  . Right hip replacement 09/22/09  . Left hip replacement   . Eye surgery     History   Social History  .  Marital Status: Married    Spouse Name: N/A    Number of Children: 2  . Years of Education: N/A   Occupational History  . truck driver-retired    Social History Main Topics  . Smoking status: Former Smoker -- 1.0 packs/day for 15 years    Types: Cigarettes    Quit date: 02/05/1977  . Smokeless tobacco: Never Used  . Alcohol Use: 0.5 oz/week    1 drink(s) per week     RARE  . Drug Use: No  . Sexually Active: Not Currently   Other Topics Concern  . Not on file   Social History Narrative  . No narrative on file    Current Outpatient Prescriptions on File Prior to Visit  Medication Sig Dispense Refill  . amLODipine (NORVASC) 10 MG tablet Take 1 tablet (10 mg total) by mouth daily.  30 tablet  3  . aspirin 81 MG EC tablet Take 81 mg by mouth daily.        . cloNIDine (CATAPRES) 0.2 MG tablet Take 1 tablet (0.2 mg total) by mouth 2 (two) times daily.  60 tablet  3  . clopidogrel (PLAVIX) 75 MG tablet Take 1 tablet (75 mg total) by mouth daily.  30 tablet  3  . ibuprofen (ADVIL,MOTRIN) 200 MG tablet Take 400 mg by mouth every 6 (six) hours as needed. pain      . isosorbide mononitrate (IMDUR)  30 MG 24 hr tablet Take 1 tablet (30 mg total) by mouth daily.  30 tablet  3  . loratadine (CLARITIN) 10 MG tablet Take 10 mg by mouth daily.      [name] lovastatin (MEVACOR) 40 MG tablet Take 1 tablet (40 mg total) by mouth at bedtime.  30 tablet  6  . Menthol-Methyl Salicylate (MUSCLE RUB) 10-15 % CREA Apply 1 application topically as needed. To arm for pain      . nitroGLYCERIN (NITROSTAT) 0.4 MG SL tablet Place 1 tablet (0.4 mg total) under the tongue every 5 (five) minutes as needed for chest pain.  25 tablet  3  . rOPINIRole (REQUIP) 1 MG tablet Take 1 mg by mouth 3 (three) times daily.       [name] zolpidem (AMBIEN) 5 MG tablet Take 1 tablet (5 mg total) by mouth at bedtime as needed for sleep.  30 tablet  3    Allergies  Allergen Reactions  . Amitriptyline Hcl     REACTION: confusion  .  Diphenhydramine Hcl Other (See Comments)    Keeps him awake  . Simvastatin     REACTION: leg pain    Family History  Problem Relation Age of Onset  . Heart attack Father 42    Smoker  . Heart attack Brother 70    BP 150/78  Pulse 78  Temp 97.8 F (36.6 C) (Oral)  Wt 150 lb 8 oz (68.266 kg)  SpO2 98%   Review of Systems Denies weight change    Objective:   Physical Exam VITAL SIGNS:  See vs page GENERAL: no distress NECK: There is no palpable thyroid enlargement.  No thyroid nodule is palpable.  No palpable lymphadenopathy at the anterior neck. =  Lab Results  Component Value Date   TSH 6.49* 05/03/2012      Assessment & Plan:  subacute thyroiditis.  Still slightly hypothyroid.

## 2012-06-17 ENCOUNTER — Other Ambulatory Visit (INDEPENDENT_AMBULATORY_CARE_PROVIDER_SITE_OTHER): Payer: Medicare Other

## 2012-06-17 ENCOUNTER — Other Ambulatory Visit: Payer: Self-pay | Admitting: *Deleted

## 2012-06-17 ENCOUNTER — Telehealth: Payer: Self-pay | Admitting: *Deleted

## 2012-06-17 DIAGNOSIS — I2510 Atherosclerotic heart disease of native coronary artery without angina pectoris: Secondary | ICD-10-CM

## 2012-06-17 LAB — HEPATIC FUNCTION PANEL
ALT: 12 U/L (ref 0–53)
AST: 16 U/L (ref 0–37)
Alkaline Phosphatase: 61 U/L (ref 39–117)
Bilirubin, Direct: 0.1 mg/dL (ref 0.0–0.3)
Total Bilirubin: 1.1 mg/dL (ref 0.3–1.2)
Total Protein: 6.3 g/dL (ref 6.0–8.3)

## 2012-06-17 LAB — LIPID PANEL
Cholesterol: 135 mg/dL (ref 0–200)
VLDL: 14.8 mg/dL (ref 0.0–40.0)

## 2012-06-17 MED ORDER — FAMOTIDINE 20 MG PO TABS: ORAL_TABLET | ORAL | Status: DC

## 2012-06-17 NOTE — Telephone Encounter (Signed)
Pt in the office to make an appointment for February 2014, when talking with the scheduler, pt  C/O of having some shutting pain on chest when he turn to his left side some times  in bed,it goes away when he turns the other side. 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 The pain is not bad enough to take NTG SL last time pt has the pain was this  past Friday. Patient also C/O of heart burn all the time, because the MD d/C his Prilosec when pt started Plavix 75 mg daily. Dr Angelena Form aware, and prescribed Pepcid 20 mg daily. Prescription send to [facility]. Patient is aware to call the office if needed. Scheduler will make the 6 months return visit. Patient and wife aware.

## 2012-06-18 ENCOUNTER — Other Ambulatory Visit: Payer: Medicare Other

## 2012-07-03 ENCOUNTER — Other Ambulatory Visit (INDEPENDENT_AMBULATORY_CARE_PROVIDER_SITE_OTHER): Payer: Medicare Other

## 2012-07-03 DIAGNOSIS — Z125 Encounter for screening for malignant neoplasm of prostate: Secondary | ICD-10-CM

## 2012-07-03 DIAGNOSIS — E785 Hyperlipidemia, unspecified: Secondary | ICD-10-CM

## 2012-07-03 DIAGNOSIS — Z0000 Encounter for general adult medical examination without abnormal findings: Secondary | ICD-10-CM

## 2012-07-03 DIAGNOSIS — I10 Essential (primary) hypertension: Secondary | ICD-10-CM

## 2012-07-03 LAB — BASIC METABOLIC PANEL
CO2: 27 mEq/L (ref 19–32)
Chloride: 106 mEq/L (ref 96–112)
Potassium: 4.3 mEq/L (ref 3.5–5.1)
Sodium: 138 mEq/L (ref 135–145)

## 2012-07-03 LAB — URINALYSIS, ROUTINE W REFLEX MICROSCOPIC
Bilirubin Urine: NEGATIVE
Hgb urine dipstick: NEGATIVE
Total Protein, Urine: NEGATIVE
Urine Glucose: NEGATIVE
Urobilinogen, UA: 0.2 (ref 0.0–1.0)

## 2012-07-03 LAB — CBC WITH DIFFERENTIAL/PLATELET
Basophils Absolute: 0 10*3/uL (ref 0.0–0.1)
Basophils Relative: 0.3 % (ref 0.0–3.0)
Eosinophils Absolute: 0.2 10*3/uL (ref 0.0–0.7)
Lymphocytes Relative: 26.9 % (ref 12.0–46.0)
MCHC: 33 g/dL (ref 30.0–36.0)
MCV: 89.8 fl (ref 78.0–100.0)
Monocytes Absolute: 0.5 10*3/uL (ref 0.1–1.0)
Neutro Abs: 3.5 10*3/uL (ref 1.4–7.7)
Neutrophils Relative %: 60.2 % (ref 43.0–77.0)
RBC: 3.92 Mil/uL — ABNORMAL LOW (ref 4.22–5.81)
RDW: 12.8 % (ref 11.5–14.6)

## 2012-07-03 LAB — HEPATIC FUNCTION PANEL
ALT: 16 U/L (ref 0–53)
Alkaline Phosphatase: 70 U/L (ref 39–117)
Bilirubin, Direct: 0.2 mg/dL (ref 0.0–0.3)
Total Bilirubin: 0.8 mg/dL (ref 0.3–1.2)
Total Protein: 6.1 g/dL (ref 6.0–8.3)

## 2012-07-03 LAB — LIPID PANEL
LDL Cholesterol: 73 mg/dL (ref 0–99)
Total CHOL/HDL Ratio: 3

## 2012-07-05 ENCOUNTER — Encounter: Payer: Self-pay | Admitting: Internal Medicine

## 2012-07-05 ENCOUNTER — Ambulatory Visit (INDEPENDENT_AMBULATORY_CARE_PROVIDER_SITE_OTHER): Payer: Medicare Other | Admitting: Internal Medicine

## 2012-07-05 VITALS — BP 182/90 | HR 77 | Temp 97.3°F | Ht 69.0 in | Wt 157.5 lb

## 2012-07-05 DIAGNOSIS — I10 Essential (primary) hypertension: Secondary | ICD-10-CM

## 2012-07-05 DIAGNOSIS — Z0000 Encounter for general adult medical examination without abnormal findings: Secondary | ICD-10-CM

## 2012-07-05 DIAGNOSIS — R7309 Other abnormal glucose: Secondary | ICD-10-CM

## 2012-07-05 DIAGNOSIS — E785 Hyperlipidemia, unspecified: Secondary | ICD-10-CM

## 2012-07-05 DIAGNOSIS — H9190 Unspecified hearing loss, unspecified ear: Secondary | ICD-10-CM | POA: Insufficient documentation

## 2012-07-05 DIAGNOSIS — R7302 Impaired glucose tolerance (oral): Secondary | ICD-10-CM

## 2012-07-05 DIAGNOSIS — E0590 Thyrotoxicosis, unspecified without thyrotoxic crisis or storm: Secondary | ICD-10-CM

## 2012-07-05 DIAGNOSIS — J3489 Other specified disorders of nose and nasal sinuses: Secondary | ICD-10-CM | POA: Insufficient documentation

## 2012-07-05 MED ORDER — AMLODIPINE BESYLATE 10 MG PO TABS: 10.0000 mg | ORAL_TABLET | Freq: Every day | ORAL | Status: DC

## 2012-07-05 NOTE — Patient Instructions (Addendum)
Continue all other medications as before Your amlodipine was refilled today Please have the pharmacy call with any other refills you may need. You will be contacted regarding the referral for colonoscopy Please continue your efforts at being more active, low cholesterol diet, and weight control. You are otherwise up to date with prevention No further blood work needed today To Log into MyChart, please go to [email or web address], and your Username is: [email or web address] You will be contacted regarding the referral for: ENT for hearing and the nose lesion Hopefully, you should hear about the thyroid test per Dr Loanne Drilling Please return in 6 mo with Lab testing done 3-5 days before

## 2012-07-05 NOTE — Assessment & Plan Note (Signed)
Also with nasal skin lesions likely not amenable to derm removal; for ENT referral, consider plastic surgury

## 2012-07-05 NOTE — Assessment & Plan Note (Signed)
Overall doing well, age appropriate education and counseling updated, referrals for preventative services and immunizations addressed, dietary and smoking counseling addressed, most recent labs and ECG reviewed.  I have personally reviewed and have noted: 1) the patient's medical and social history 2) The pt's use of alcohol, tobacco, and illicit drugs 3) The patient's current medications and supplements 4) Functional ability including ADL's, fall risk, home safety risk, hearing and visual impairment 5) Diet and physical activities 6) Evidence for depression or mood disorder 7) The patient's height, weight, and BMI have been recorded in the chart I have made referrals, and provided counseling and education based on review of the above For colonscopy

## 2012-07-05 NOTE — Assessment & Plan Note (Signed)
Uncontrolled, to re-start the amlodipine,  to f/u any worsening symptoms or concerns, f/u BP at home

## 2012-07-05 NOTE — Progress Notes (Signed)
Subjective:    Patient ID: Victor Davis, male    DOB: 03/12/1941, 71 y.o.   MRN: [id number]  HPI  Here for wellness and f/u;  Overall doing ok;  Pt denies CP, worsening SOB, DOE, wheezing, orthopnea, PND, worsening LE edema, palpitations, dizziness or syncope.  Pt denies neurological change such as new Headache, facial or extremity weakness.  Pt denies polydipsia, polyuria, or low sugar symptoms. Pt states overall good compliance with treatment and medications, good tolerability, and trying to follow lower cholesterol diet.  Pt denies worsening depressive symptoms, suicidal ideation or panic. No fever, wt loss, night sweats, loss of appetite, or other constitutional symptoms.  Pt states good ability with ADL's, low fall risk, home safety reviewed and adequate, no significant changes in hearing or vision, and occasionally active with exercise.  Did not take BP med this am, forgot, usually Overall good compliance with treatment, and good medicine tolerability. BP at home < 140/90.  Did see Dr Jamse Arn for hematuria neg for malignancy per pt.  Has ongoing hearing loss, also with 2 enlarging spots to nose, has seen derm, not better with neosporin.   Past Medical History  Diagnosis Date  . BRADYCARDIA 10/24/2010  . CALF PAIN, RIGHT 10/22/2009  . 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 Dysuria 10/10/2010  . ERECTILE DYSFUNCTION 04/05/2007  . FATIGUE 10/02/2007  . GLUCOSE INTOLERANCE 07/15/2008  . HYPERLIPIDEMIA 07/30/2008  . HYPERSOMNIA 10/02/2007  . INSOMNIA-SLEEP DISORDER-UNSPEC 10/22/2009  . LOW BACK PAIN 04/05/2007  . OSTEOARTHRITIS 04/05/2007  . PEPTIC ULCER DISEASE 04/05/2007  . PERIPHERAL EDEMA 10/22/2009  . PERIPHERAL VASCULAR DISEASE 07/30/2008  . PROSTATE CANCER, HX OF 04/05/2007  . RESTLESS LEG SYNDROME 05/15/2007  . RHINITIS, ALLERGIC NOS 05/15/2007  . SHOULDER PAIN, LEFT 01/22/2008  . SINUSITIS- ACUTE-NOS 05/15/2007  . Unspecified essential hypertension 04/05/2007  . Unspecified  visual loss 07/15/2008  . UTI 10/24/2010  . Impaired glucose tolerance 02/09/2011  . Coronary artery disease      95% followed by 80% mid LAD stenosis, circumflex 80% stenosis. There was a moderate size branching obtuse marginal with 90-95% stenosis in the inferior branch. The right coronary artery had proximal 30% stenosis. Posterior lateral was moderate-sized with 40% stenosis. The EF was 65-70%. He had a DES and  x 2 to the LAD and DES x 2 to the circumflex 03/08/12.    [name] GERD (gastroesophageal reflux disease)   . Emphysema 04/01/2012    By CT chest, august 2013   Past Surgical History  Procedure Date  . Inguinal herniorrhapy left 2002  . Rotator cuff repair     left  . Knee surgery   . S/p ventral surgery 2009  . S/p rad protatectomy 2002  . Right hip replacement 09/22/09  . Left hip replacement   . Eye surgery     reports that he quit smoking about 35 years ago. His smoking use included Cigarettes. He has a 15 pack-year smoking history. He has never used smokeless tobacco. He reports that he drinks about .5 ounces of alcohol per week. He reports that he does not use illicit drugs. family history includes Heart attack (age of onset:54) in his father and Heart attack (age of onset:70) in his brother. Allergies  Allergen Reactions  . Amitriptyline Hcl     REACTION: confusion  . Diphenhydramine Hcl Other (See Comments)    Keeps him awake  . Simvastatin     REACTION: leg pain   Current Outpatient Prescriptions on File Prior to Visit  Medication Sig Dispense Refill  . aspirin 81 MG EC tablet Take 81 mg by mouth daily.        . cloNIDine (CATAPRES) 0.2 MG tablet Take 1 tablet (0.2 mg total) by mouth 2 (two) times daily.  60 tablet  3  . clopidogrel (PLAVIX) 75 MG tablet Take 1 tablet (75 mg total) by mouth daily.  30 tablet  3  . famotidine (PEPCID) 20 MG tablet Take one tablet by mouth once a day.  30 tablet  8  . ibuprofen (ADVIL,MOTRIN) 200 MG tablet Take 400 mg by mouth every 6 (six)  hours as needed. pain      . isosorbide mononitrate (IMDUR) 30 MG 24 hr tablet Take 1 tablet (30 mg total) by mouth daily.  30 tablet  3  . loratadine (CLARITIN) 10 MG tablet Take 10 mg by mouth daily.      [name] lovastatin (MEVACOR) 40 MG tablet Take 1 tablet (40 mg total) by mouth at bedtime.  30 tablet  6  . Menthol-Methyl Salicylate (MUSCLE RUB) 10-15 % CREA Apply 1 application topically as needed. To arm for pain      . nitroGLYCERIN (NITROSTAT) 0.4 MG SL tablet Place 1 tablet (0.4 mg total) under the tongue every 5 (five) minutes as needed for chest pain.  25 tablet  3  . rOPINIRole (REQUIP) 1 MG tablet Take 1 mg by mouth 3 (three) times daily.       [name] zolpidem (AMBIEN) 5 MG tablet Take 1 tablet (5 mg total) by mouth at bedtime as needed for sleep.  30 tablet  3  . [DISCONTINUED] amLODipine (NORVASC) 10 MG tablet Take 1 tablet (10 mg total) by mouth daily.  30 tablet  3   Review of Systems Review of Systems  Constitutional: Negative for diaphoresis, activity change, appetite change and unexpected weight change.  HENT: Negative for hearing loss, ear pain, facial swelling, mouth sores and neck stiffness.   Eyes: Negative for pain, redness and visual disturbance.  Respiratory: Negative for shortness of breath and wheezing.   Cardiovascular: Negative for chest pain and palpitations.  Gastrointestinal: Negative for diarrhea, blood in stool, abdominal distention and rectal pain.  Genitourinary: Negative for hematuria, flank pain and decreased urine volume.  Musculoskeletal: Negative for myalgias and joint swelling.  Skin: Negative for color change and wound.  Neurological: Negative for syncope and numbness.  Hematological: Negative for adenopathy.  Psychiatric/Behavioral: Negative for hallucinations, self-injury, decreased concentration and agitation.      Objective:   Physical Exam BP 182/90  Pulse 77  Temp 97.3 F (36.3 C) (Oral)  Ht 5\' 9"  (1.753 m)  Wt 157 lb 8 oz (71.442 kg)  BMI  23.26 kg/m2  SpO2 95% Physical Exam  VS noted Constitutional: Pt is oriented to person, place, and time. Appears well-developed and well-nourished.  HENT:  Head: Normocephalic and atraumatic.  Right Ear: External ear normal.  Left Ear: External ear normal.  Nose: Nose normal.  Mouth/Throat: Oropharynx is clear and moist.  Eyes: Conjunctivae and EOM are normal. Pupils are equal, round, and reactive to light.  Neck: Normal range of motion. Neck supple. No JVD present. No tracheal deviation present.  Cardiovascular: Normal rate, regular rhythm, normal heart sounds and intact distal pulses.   Pulmonary/Chest: Effort normal and breath sounds normal.  Abdominal: Soft. Bowel sounds are normal. There is no tenderness.  Musculoskeletal: Normal range of motion. Exhibits no edema.  Lymphadenopathy:  Has no cervical adenopathy.  Neurological: Pt is alert  and oriented to person, place, and time. Pt has normal reflexes. No cranial nerve deficit. Except for [facility] Skin: Skin is warm and dry. No rash noted. except for 2 small fleshy lesion to left nose Psychiatric:  Has  normal mood and affect. Behavior is normal.     Assessment & Plan:

## 2012-07-05 NOTE — Assessment & Plan Note (Signed)
To f/u with Dr Loanne Drilling as planned

## 2012-07-08 ENCOUNTER — Encounter: Payer: Self-pay | Admitting: Internal Medicine

## 2012-07-17 ENCOUNTER — Telehealth: Payer: Self-pay | Admitting: Internal Medicine

## 2012-07-17 NOTE — Telephone Encounter (Signed)
Received 2 pages from [facility]. Sent to Dr. Jenny Reichmann. 07/17/12/sd

## 2012-07-29 ENCOUNTER — Encounter: Payer: Self-pay | Admitting: Internal Medicine

## 2012-07-31 ENCOUNTER — Encounter: Payer: Self-pay | Admitting: Internal Medicine

## 2012-07-31 ENCOUNTER — Ambulatory Visit (INDEPENDENT_AMBULATORY_CARE_PROVIDER_SITE_OTHER): Payer: Medicare Other | Admitting: Internal Medicine

## 2012-07-31 VITALS — BP 140/90 | HR 54 | Ht 67.5 in | Wt 161.0 lb

## 2012-07-31 DIAGNOSIS — Z1211 Encounter for screening for malignant neoplasm of colon: Secondary | ICD-10-CM

## 2012-07-31 DIAGNOSIS — K219 Gastro-esophageal reflux disease without esophagitis: Secondary | ICD-10-CM

## 2012-07-31 DIAGNOSIS — Z7902 Long term (current) use of antithrombotics/antiplatelets: Secondary | ICD-10-CM

## 2012-07-31 DIAGNOSIS — Z5181 Encounter for therapeutic drug level monitoring: Secondary | ICD-10-CM

## 2012-07-31 MED ORDER — FAMOTIDINE 20 MG PO TABS: ORAL_TABLET | ORAL | Status: DC

## 2012-07-31 NOTE — Patient Instructions (Addendum)
We have sent the following medications to your pharmacy for you to pick up at your convenience: pepcid, you may increase it to 1 tablet twice a day  Follow up with Dr. Hilarie Fredrickson in August of 2014

## 2012-07-31 NOTE — Progress Notes (Signed)
Patient ID: Victor Davis, male   DOB: April 20, 1941, 71 y.o.   MRN: [id number]  SUBJECTIVE: HPI Victor Davis is a 71 year old male with a past medical history of CAD status post PCI in July 2013, impaired glucose tolerance, prostate cancer, peripheral vascular disease, hyperlipidemia who seen in consultation at the request of Dr. Jenny Reichmann for evaluation of screening colonoscopy. He is accompanied today by his wife. He reports no issues with his bowel habits. He reports occasional constipation, but this is long-standing and rare. He denies blood in his stool or melena. No abdominal pain. He reports a colonoscopy about 10 years ago, which was reportedly normal. He does have a history of heartburn and was previously taking Nexium which controlled his symptoms very well. This medication was changed to famotidine after his bare-metal stents were placed and he started Plavix.  He reports that with the Pepcid his heartburn is not as well controlled, but he is recently at the advice of a friend, started using spoon of mustard every day, which has alleviated his heartburn entirely.  He denies nausea and vomiting. No dysphagia or odynophagia.  Review of Systems  As per history of present illness, otherwise negative   Past Medical History  Diagnosis Date  . BRADYCARDIA 10/24/2010  . CALF PAIN, RIGHT 10/22/2009  . 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 Dysuria 10/10/2010  . ERECTILE DYSFUNCTION 04/05/2007  . FATIGUE 10/02/2007  . GLUCOSE INTOLERANCE 07/15/2008  . HYPERLIPIDEMIA 07/30/2008  . HYPERSOMNIA 10/02/2007  . INSOMNIA-SLEEP DISORDER-UNSPEC 10/22/2009  . LOW BACK PAIN 04/05/2007  . OSTEOARTHRITIS 04/05/2007  . PEPTIC ULCER DISEASE 04/05/2007  . PERIPHERAL EDEMA 10/22/2009  . PERIPHERAL VASCULAR DISEASE 07/30/2008  . PROSTATE CANCER, HX OF 04/05/2007  . RESTLESS LEG SYNDROME 05/15/2007  . RHINITIS, ALLERGIC NOS 05/15/2007  . SHOULDER PAIN, LEFT 01/22/2008  . SINUSITIS- ACUTE-NOS 05/15/2007  . Unspecified essential  hypertension 04/05/2007  . Unspecified visual loss 07/15/2008  . UTI 10/24/2010  . Impaired glucose tolerance 02/09/2011  . Coronary artery disease      95% followed by 80% mid LAD stenosis, circumflex 80% stenosis. There was a moderate size branching obtuse marginal with 90-95% stenosis in the inferior branch. The right coronary artery had proximal 30% stenosis. Posterior lateral was moderate-sized with 40% stenosis. The EF was 65-70%. He had a DES and  x 2 to the LAD and DES x 2 to the circumflex 03/08/12.    [name] GERD (gastroesophageal reflux disease)   . Emphysema 04/01/2012    By CT chest, august 2013    Current Outpatient Prescriptions  Medication Sig Dispense Refill  . amLODipine (NORVASC) 10 MG tablet Take 1 tablet (10 mg total) by mouth daily.  30 tablet  3  . aspirin 81 MG EC tablet Take 81 mg by mouth daily.        . cloNIDine (CATAPRES) 0.2 MG tablet Take 1 tablet (0.2 mg total) by mouth 2 (two) times daily.  60 tablet  3  . clopidogrel (PLAVIX) 75 MG tablet Take 1 tablet (75 mg total) by mouth daily.  30 tablet  3  . famotidine (PEPCID) 20 MG tablet Take one tablet by mouth once a day.  60 tablet  8  . ibuprofen (ADVIL,MOTRIN) 200 MG tablet Take 400 mg by mouth every 6 (six) hours as needed. pain      . isosorbide mononitrate (IMDUR) 30 MG 24 hr tablet Take 1 tablet (30 mg total) by mouth daily.  30 tablet  3  . loratadine (CLARITIN) 10  MG tablet Take 10 mg by mouth daily.      [name] lovastatin (MEVACOR) 40 MG tablet Take 1 tablet (40 mg total) by mouth at bedtime.  30 tablet  6  . Menthol-Methyl Salicylate (MUSCLE RUB) 10-15 % CREA Apply 1 application topically as needed. To arm for pain      . nitroGLYCERIN (NITROSTAT) 0.4 MG SL tablet Place 1 tablet (0.4 mg total) under the tongue every 5 (five) minutes as needed for chest pain.  25 tablet  3  . rOPINIRole (REQUIP) 1 MG tablet Take 1 mg by mouth 3 (three) times daily.       [name] zolpidem (AMBIEN) 5 MG tablet Take 1 tablet (5 mg total) by  mouth at bedtime as needed for sleep.  30 tablet  3    Allergies  Allergen Reactions  . Amitriptyline Hcl     REACTION: confusion  . Diphenhydramine Hcl Other (See Comments)    Keeps him awake  . Simvastatin     REACTION: leg pain    Family History  Problem Relation Age of Onset  . Heart attack Father 52    Smoker  . Heart attack Brother 43  . Colon cancer Neg Hx   . Lung cancer Sister     History  Substance Use Topics  . Smoking status: Former Smoker -- 1.0 packs/day for 15 years    Types: Cigarettes    Quit date: 02/05/1977  . Smokeless tobacco: Never Used  . Alcohol Use: 0.5 oz/week    1 drink(s) per week     Comment: RARE    OBJECTIVE: BP 140/90  Pulse 54  Ht 5' 7.5" (1.715 m)  Wt 161 lb (73.029 kg)  BMI 24.84 kg/m2 Constitutional: Well-developed and well-nourished. No distress. HEENT: Normocephalic and atraumatic. Oropharynx is clear and moist. Poor dentition, no icterus Neck: Neck supple. Trachea midline. Cardiovascular: Normal rate, regular rhythm and intact distal pulses.  Pulmonary/chest: Effort normal and breath sounds normal. No wheezing, rales or rhonchi. Abdominal: Soft, nontender, nondistended. Bowel sounds active throughout.  Extremities: no clubbing, cyanosis, with trace pretibial edema Lymphadenopathy: No cervical adenopathy noted. Neurological: Alert and oriented to person place and time. Skin: Skin is warm and dry. No rashes noted. Psychiatric: Normal mood and affect. Behavior is normal.  Labs and Imaging -- CBC    Component Value Date/Time   WBC 5.8 07/03/2012 0734   RBC 3.92* 07/03/2012 0734   HGB 11.6* 07/03/2012 0734   HCT 35.2* 07/03/2012 0734   PLT 149.0* 07/03/2012 0734   MCV 89.8 07/03/2012 0734   MCH 29.2 03/26/2012 1318   MCHC 33.0 07/03/2012 0734   RDW 12.8 07/03/2012 0734   LYMPHSABS 1.6 07/03/2012 0734   MONOABS 0.5 07/03/2012 0734   EOSABS 0.2 07/03/2012 0734   BASOSABS 0.0 07/03/2012 0734    CMP     Component  Value Date/Time   NA 138 07/03/2012 0734   K 4.3 07/03/2012 0734   CL 106 07/03/2012 0734   CO2 27 07/03/2012 0734   GLUCOSE 93 07/03/2012 0734   BUN 21 07/03/2012 0734   CREATININE 1.2 07/03/2012 0734   CALCIUM 9.1 07/03/2012 0734   PROT 6.1 07/03/2012 0734   ALBUMIN 3.8 07/03/2012 0734   AST 19 07/03/2012 0734   ALT 16 07/03/2012 0734   ALKPHOS 70 07/03/2012 0734   BILITOT 0.8 07/03/2012 0734   GFRNONAA 61* 03/26/2012 1318   GFRAA 71* 03/26/2012 1318     ASSESSMENT AND PLAN:  71 year old male with  a past medical history of CAD status post PCI in July 2013, impaired glucose tolerance, prostate cancer, peripheral vascular disease, hyperlipidemia who seen in consultation at the request of Dr. Jenny Reichmann for evaluation of screening colonoscopy  1.  CRC screening -- at this time the patient needs to remain on Plavix at least through July 2014. We have discussed how ideally Plavix is held at the time of colonoscopy to allow for therapeutic intervention should a polyp be discovered. At this time given that his colonoscopy is just for screening, and not for investigation of symptoms, I recommended waiting until after July 2014 to perform his colonoscopy. He understands this recommendation and agrees. I will bring him back in early August 2014 to discuss screening colonoscopy at that time, and we will seek permission to hold his Plavix from his cardiologist, Dr. Angelena Form  2.  GERD -- not currently symptomatic, but I have advised that he can use famotidine 20 mg every 12 hours to control symptoms if necessary. He has only been using this once daily. There are no alarm symptoms at this time.

## 2012-08-09 ENCOUNTER — Other Ambulatory Visit (HOSPITAL_COMMUNITY): Payer: Self-pay | Admitting: Physician Assistant

## 2012-08-27 ENCOUNTER — Other Ambulatory Visit: Payer: Self-pay | Admitting: Internal Medicine

## 2012-08-27 ENCOUNTER — Other Ambulatory Visit: Payer: Self-pay | Admitting: Cardiovascular Disease

## 2012-08-27 NOTE — Telephone Encounter (Signed)
Refill done erx

## 2012-08-29 ENCOUNTER — Other Ambulatory Visit: Payer: Self-pay | Admitting: *Deleted

## 2012-08-29 MED ORDER — ISOSORBIDE MONONITRATE ER 30 MG PO TB24: 30.0000 mg | ORAL_TABLET | Freq: Every day | ORAL | Status: DC

## 2012-09-17 ENCOUNTER — Ambulatory Visit: Payer: Medicare Other | Admitting: Cardiovascular Disease

## 2012-10-16 ENCOUNTER — Encounter: Payer: Self-pay | Admitting: Cardiovascular Disease

## 2012-10-16 ENCOUNTER — Encounter (INDEPENDENT_AMBULATORY_CARE_PROVIDER_SITE_OTHER): Payer: Medicare Other

## 2012-10-16 ENCOUNTER — Ambulatory Visit (INDEPENDENT_AMBULATORY_CARE_PROVIDER_SITE_OTHER): Payer: Medicare Other | Admitting: Cardiovascular Disease

## 2012-10-16 VITALS — BP 125/69 | HR 69 | Ht 68.0 in | Wt 161.0 lb

## 2012-10-16 DIAGNOSIS — M79609 Pain in unspecified limb: Secondary | ICD-10-CM

## 2012-10-16 DIAGNOSIS — R600 Localized edema: Secondary | ICD-10-CM

## 2012-10-16 DIAGNOSIS — I10 Essential (primary) hypertension: Secondary | ICD-10-CM

## 2012-10-16 DIAGNOSIS — I2510 Atherosclerotic heart disease of native coronary artery without angina pectoris: Secondary | ICD-10-CM

## 2012-10-16 DIAGNOSIS — R609 Edema, unspecified: Secondary | ICD-10-CM

## 2012-10-16 DIAGNOSIS — I779 Disorder of arteries and arterioles, unspecified: Secondary | ICD-10-CM

## 2012-10-16 NOTE — Patient Instructions (Addendum)
Your physician wants you to follow-up in: 6 months. You will receive a reminder letter in the mail two months in advance. If you don't receive a letter, please call our office to schedule the follow-up appointment.  Your physician has requested that you have a lower or upper extremity venous duplex. This test is an ultrasound of the veins in the legs or arms. It looks at venous blood flow that carries blood from the heart to the legs or arms. Allow one hour for a Lower Venous exam. Allow thirty minutes for an Upper Venous exam. There are no restrictions or special instructions. To be done at 3:00 today  Your physician has requested that you have a carotid duplex. This test is an ultrasound of the carotid arteries in your neck. It looks at blood flow through these arteries that supply the brain with blood. Allow one hour for this exam. There are no restrictions or special instructions.

## 2012-10-16 NOTE — Progress Notes (Signed)
History of Present Illness: 72 yo white male with history of CVA, HTN, HLD, PAD, prostate cancer who is here today for cardiac follow up. He was seen as a new patient 02/06/12 for evaluation of abnormal chest CT. His chest CT showed calcification of the aorta and coronary arteries. He described occasional chest pains that occurred at rest and with exertion. No SOB. Left heart cath 02/12/12 showed severe LAD and Circumflex disease. PCI delayed because of renal insufficiency and while he was having a renal cyst workup. I brought him back on 03/08/12 and placed 2 DES in the LAD and 2 DES in the Circumflex. He did well following the PCI.   He is here today for follow up. He is doing well. No exertional chest pains or SOB. He does have heartburn after he eats spicy foods. He is bowling again and feeling well. He did trip 4 weeks ago and hurt his right foot. Since then he has had edema of his right leg, mostly resolves at night. The right leg is not painful but his right foot is still painful.   Primary Care Physician: Cathlean Cower  Last Lipid Profile:Lipid Panel     Component Value Date/Time   CHOL 120 07/03/2012 0734   TRIG 58.0 07/03/2012 0734   HDL 35.30* 07/03/2012 0734   CHOLHDL 3 07/03/2012 0734   VLDL 11.6 07/03/2012 0734   LDLCALC 73 07/03/2012 0734     Past Medical History  Diagnosis Date  . BRADYCARDIA 10/24/2010  . CALF PAIN, RIGHT 10/22/2009  . 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 Dysuria 10/10/2010  . ERECTILE DYSFUNCTION 04/05/2007  . FATIGUE 10/02/2007  . GLUCOSE INTOLERANCE 07/15/2008  . HYPERLIPIDEMIA 07/30/2008  . HYPERSOMNIA 10/02/2007  . INSOMNIA-SLEEP DISORDER-UNSPEC 10/22/2009  . LOW BACK PAIN 04/05/2007  . OSTEOARTHRITIS 04/05/2007  . PEPTIC ULCER DISEASE 04/05/2007  . PERIPHERAL EDEMA 10/22/2009  . PERIPHERAL VASCULAR DISEASE 07/30/2008  . PROSTATE CANCER, HX OF 04/05/2007  . RESTLESS LEG SYNDROME 05/15/2007  . RHINITIS, ALLERGIC NOS 05/15/2007  . SHOULDER PAIN, LEFT  01/22/2008  . SINUSITIS- ACUTE-NOS 05/15/2007  . Unspecified essential hypertension 04/05/2007  . Unspecified visual loss 07/15/2008  . UTI 10/24/2010  . Impaired glucose tolerance 02/09/2011  . Coronary artery disease      95% followed by 80% mid LAD stenosis, circumflex 80% stenosis. There was a moderate size branching obtuse marginal with 90-95% stenosis in the inferior branch. The right coronary artery had proximal 30% stenosis. Posterior lateral was moderate-sized with 40% stenosis. The EF was 65-70%. He had a DES and  x 2 to the LAD and DES x 2 to the circumflex 03/08/12.    [name] GERD (gastroesophageal reflux disease)   . Emphysema 04/01/2012    By CT chest, august 2013    Past Surgical History  Procedure Laterality Date  . Inguinal herniorrhapy left  2002    x 2  . Rotator cuff repair      left  . Knee surgery      left  . S/p ventral surgery  2009  . Prostatectomy  2002    radical  . Right hip replacement  09/22/09  . Left hip replacement    . Eye surgery      right    Current Outpatient Prescriptions  Medication Sig Dispense Refill  . amLODipine (NORVASC) 10 MG tablet Take 1 tablet (10 mg total) by mouth daily.  30 tablet  3  . aspirin 81 MG EC tablet Take 81 mg by mouth  daily.        . cloNIDine (CATAPRES) 0.2 MG tablet TAKE 1 TABLET (0.2 MG TOTAL) BY MOUTH 2 (TWO) TIMES DAILY.  60 tablet  3  . clopidogrel (PLAVIX) 75 MG tablet TAKE 1 TABLET BY MOUTH EVERY DAY  30 tablet  6  . famotidine (PEPCID) 20 MG tablet Take one tablet by mouth once a day.  60 tablet  8  . ibuprofen (ADVIL,MOTRIN) 200 MG tablet Take 400 mg by mouth every 6 (six) hours as needed. pain      . isosorbide mononitrate (IMDUR) 30 MG 24 hr tablet Take 1 tablet (30 mg total) by mouth daily.  30 tablet  6  . loratadine (CLARITIN) 10 MG tablet Take 10 mg by mouth daily.      [name] lovastatin (MEVACOR) 40 MG tablet Take 1 tablet (40 mg total) by mouth at bedtime.  30 tablet  6  . Menthol-Methyl Salicylate (MUSCLE RUB)  10-15 % CREA Apply 1 application topically as needed. To arm for pain      . nitroGLYCERIN (NITROSTAT) 0.4 MG SL tablet Place 1 tablet (0.4 mg total) under the tongue every 5 (five) minutes as needed for chest pain.  25 tablet  3  . rOPINIRole (REQUIP) 2 MG tablet TAKE 1 AND 1/2 TABLET BY MOUTH 3 TIMES A DAY  135 tablet  2  . zolpidem (AMBIEN) 5 MG tablet Take 1 tablet (5 mg total) by mouth at bedtime as needed for sleep.  30 tablet  3   No current facility-administered medications for this visit.    Allergies  Allergen Reactions  . Amitriptyline Hcl     REACTION: confusion  . Diphenhydramine Hcl Other (See Comments)    Keeps him awake  . Simvastatin     REACTION: leg pain    History   Social History  . Marital Status: Married    Spouse Name: N/A    Number of Children: 2  . Years of Education: N/A   Occupational History  . truck driver-retired    Social History Main Topics  . Smoking status: Former Smoker -- 1.00 packs/day for 15 years    Types: Cigarettes    Quit date: 02/05/1977  . Smokeless tobacco: Never Used  . Alcohol Use: 0.5 oz/week    1 drink(s) per week     Comment: RARE  . Drug Use: No  . Sexually Active: Not Currently   Other Topics Concern  . Not on file   Social History Narrative  . No narrative on file    Family History  Problem Relation Age of Onset  . Heart attack Father 47    Smoker  . Heart attack Brother 32  . Colon cancer Neg Hx   . Lung cancer Sister     Review of Systems:  As stated in the HPI and otherwise negative.   BP 125/69  Pulse 69  Ht 5\' 8"  (1.727 m)  Wt 161 lb (73.029 kg)  BMI 24.49 kg/m2  Physical Examination: General: Well developed, well nourished, NAD HEENT: OP clear, mucus membranes moist SKIN: warm, dry. No rashes. Neuro: No focal deficits Musculoskeletal: Muscle strength 5/5 all ext Psychiatric: Mood and affect normal Neck: No JVD, no carotid bruits, no thyromegaly, no lymphadenopathy. Lungs:Clear  bilaterally, no wheezes, rhonci, crackles Cardiovascular: Regular rate and rhythm. No murmurs, gallops or rubs. Abdomen:Soft. Bowel sounds present. Non-tender.  Extremities: 1+ right lower extremity edema. No left lower ext edema. Pulses are 2 + in the bilateral  DP/PT.  Assessment and Plan:   1. CAD: Stable s/p DES x 2 LAD and s/p DES x 2 Circumflex. No changes today. Will continue ASA and Plavix for at least one year post stent placement (4 DES placed July 2013). He is not on a beta blocker secondary to bradycardia while on beta blockers in the past, documented in hospital note on 03/09/12. Continue statin. Lipids well controlled.   2. HTN: Well controlled. No changes.   3. Carotid artery disease: He had mild bilateral plaque by dopplers in 2009. Will arrange repeat studies.   4. Right lower extremity edema: Post traumatic injury to right foot. Will arrange venous doppler to exclude DVT. If no evidence of DVT, will continue with leg elevation and compression stocking.

## 2012-11-01 ENCOUNTER — Encounter (INDEPENDENT_AMBULATORY_CARE_PROVIDER_SITE_OTHER): Payer: Medicare Other

## 2012-11-01 DIAGNOSIS — I6529 Occlusion and stenosis of unspecified carotid artery: Secondary | ICD-10-CM

## 2012-11-01 DIAGNOSIS — I2510 Atherosclerotic heart disease of native coronary artery without angina pectoris: Secondary | ICD-10-CM

## 2012-11-05 ENCOUNTER — Ambulatory Visit (INDEPENDENT_AMBULATORY_CARE_PROVIDER_SITE_OTHER): Payer: Self-pay | Admitting: Neurology

## 2012-11-05 DIAGNOSIS — Z0289 Encounter for other administrative examinations: Secondary | ICD-10-CM

## 2012-11-05 DIAGNOSIS — I6350 Cerebral infarction due to unspecified occlusion or stenosis of unspecified cerebral artery: Secondary | ICD-10-CM

## 2012-11-06 ENCOUNTER — Other Ambulatory Visit: Payer: Self-pay | Admitting: Cardiovascular Disease

## 2012-11-06 NOTE — Progress Notes (Signed)
Participant was in the office on 11/05/2012, for his IRIS 4th. Annual study visit. MMSE was successfully completed and Blood was drawn and shipped.

## 2012-11-08 ENCOUNTER — Other Ambulatory Visit: Payer: Self-pay | Admitting: Cardiovascular Disease

## 2012-11-08 ENCOUNTER — Other Ambulatory Visit: Payer: Self-pay | Admitting: Internal Medicine

## 2012-11-11 ENCOUNTER — Other Ambulatory Visit: Payer: Self-pay | Admitting: Internal Medicine

## 2012-11-18 ENCOUNTER — Other Ambulatory Visit: Payer: Self-pay | Admitting: Internal Medicine

## 2012-11-19 ENCOUNTER — Other Ambulatory Visit: Payer: Self-pay | Admitting: Cardiovascular Disease

## 2012-11-19 ENCOUNTER — Other Ambulatory Visit: Payer: Self-pay | Admitting: Internal Medicine

## 2012-11-19 NOTE — Telephone Encounter (Signed)
Done erx - requip

## 2012-11-20 ENCOUNTER — Other Ambulatory Visit: Payer: Self-pay | Admitting: Cardiovascular Disease

## 2012-11-20 ENCOUNTER — Other Ambulatory Visit: Payer: Self-pay

## 2012-11-20 MED ORDER — ISOSORBIDE MONONITRATE ER 30 MG PO TB24: 30.0000 mg | ORAL_TABLET | Freq: Every day | ORAL | Status: DC

## 2012-12-27 ENCOUNTER — Other Ambulatory Visit (INDEPENDENT_AMBULATORY_CARE_PROVIDER_SITE_OTHER): Payer: Medicare Other

## 2012-12-27 DIAGNOSIS — R7302 Impaired glucose tolerance (oral): Secondary | ICD-10-CM

## 2012-12-27 DIAGNOSIS — R7309 Other abnormal glucose: Secondary | ICD-10-CM

## 2012-12-27 DIAGNOSIS — E785 Hyperlipidemia, unspecified: Secondary | ICD-10-CM

## 2012-12-27 DIAGNOSIS — D649 Anemia, unspecified: Secondary | ICD-10-CM

## 2012-12-27 LAB — BASIC METABOLIC PANEL
Chloride: 109 mEq/L (ref 96–112)
GFR: 59.57 mL/min — ABNORMAL LOW (ref >60.00)
Potassium: 4 mEq/L (ref 3.5–5.1)
Sodium: 140 mEq/L (ref 135–145)

## 2012-12-27 LAB — CBC WITH DIFFERENTIAL/PLATELET
Basophils Relative: 0.4 % (ref 0.0–3.0)
Eosinophils Absolute: 0.2 10*3/uL (ref 0.0–0.7)
HCT: 34.6 % — ABNORMAL LOW (ref 39.0–52.0)
Hemoglobin: 11.9 g/dL — ABNORMAL LOW (ref 13.0–17.0)
Lymphs Abs: 1.5 10*3/uL (ref 0.7–4.0)
MCHC: 34.3 g/dL (ref 30.0–36.0)
MCV: 87.8 fl (ref 78.0–100.0)
Monocytes Absolute: 0.8 10*3/uL (ref 0.1–1.0)
Neutro Abs: 3.7 10*3/uL (ref 1.4–7.7)
Neutrophils Relative %: 59.2 % (ref 43.0–77.0)
RBC: 3.94 Mil/uL — ABNORMAL LOW (ref 4.22–5.81)

## 2012-12-27 LAB — LIPID PANEL
LDL Cholesterol: 55 mg/dL (ref 0–99)
Total CHOL/HDL Ratio: 3

## 2012-12-27 LAB — HEPATIC FUNCTION PANEL
ALT: 17 U/L (ref 0–53)
AST: 19 U/L (ref 0–37)
Bilirubin, Direct: 0.2 mg/dL (ref 0.0–0.3)
Total Bilirubin: 1.1 mg/dL (ref 0.3–1.2)

## 2012-12-31 ENCOUNTER — Encounter: Payer: Self-pay | Admitting: Internal Medicine

## 2012-12-31 ENCOUNTER — Ambulatory Visit (INDEPENDENT_AMBULATORY_CARE_PROVIDER_SITE_OTHER): Payer: Medicare Other | Admitting: Internal Medicine

## 2012-12-31 VITALS — BP 150/72 | HR 78 | Temp 97.2°F | Ht 67.0 in | Wt 163.4 lb

## 2012-12-31 DIAGNOSIS — G2581 Restless legs syndrome: Secondary | ICD-10-CM

## 2012-12-31 DIAGNOSIS — R609 Edema, unspecified: Secondary | ICD-10-CM

## 2012-12-31 DIAGNOSIS — N281 Cyst of kidney, acquired: Secondary | ICD-10-CM | POA: Insufficient documentation

## 2012-12-31 DIAGNOSIS — J3489 Other specified disorders of nose and nasal sinuses: Secondary | ICD-10-CM

## 2012-12-31 DIAGNOSIS — Z0000 Encounter for general adult medical examination without abnormal findings: Secondary | ICD-10-CM

## 2012-12-31 DIAGNOSIS — R911 Solitary pulmonary nodule: Secondary | ICD-10-CM

## 2012-12-31 MED ORDER — PRAMIPEXOLE DIHYDROCHLORIDE 1 MG PO TABS: 1.0000 mg | ORAL_TABLET | Freq: Three times a day (TID) | ORAL | Status: DC

## 2012-12-31 NOTE — Assessment & Plan Note (Signed)
Will need f/u CT chest aug 2014 - to f/u then

## 2012-12-31 NOTE — Assessment & Plan Note (Signed)
C/w venous insuff - to re-start compression stocking he does not wear

## 2012-12-31 NOTE — Assessment & Plan Note (Addendum)
Overall doing well, age appropriate education and counseling updated, referrals for preventative services and immunizations addressed, dietary and smoking counseling addressed, most recent labs reviewed.  I have personally reviewed and have noted: 1) the patient's medical and social history 2) The pt's use of alcohol, tobacco, and illicit drugs 3) The patient's current medications and supplements 4) Functional ability including ADL's, fall risk, home safety risk, hearing and visual impairment 5) Diet and physical activities 6) Evidence for depression or mood disorder 7) The patient's height, weight, and BMI have been recorded in the chart I have made referrals, and provided counseling and education based on review of the above Will be due for colonoscopy about aug 2014 after one yr on plavix and mult stents

## 2012-12-31 NOTE — Assessment & Plan Note (Signed)
Pt plans tof/u with derm

## 2012-12-31 NOTE — Assessment & Plan Note (Signed)
Ok to try change requip to mirapex asd,  to f/u any worsening symptoms or concerns

## 2012-12-31 NOTE — Patient Instructions (Signed)
Please re-start the compression stocking for the swelling to the right leg OK to stop the requip Please take all new medication as prescribed - the mirapex Please continue all other medications as before, and refills have been done if requested. Please have the pharmacy call with any other refills you may need. You are otherwise up to date with prevention measures today. Please continue your efforts at being more active, low cholesterol diet, and weight control. Please keep your appointments with your specialists as you have planned Thank you for enrolling in [facility]. Please follow the instructions below to securely access your online medical record. MyChart allows you to send messages to your doctor, view your test results, renew your prescriptions, schedule appointments, and more. You would be due for CT chest and Colonscopy about Aug 2014 or just after Please return in 3 months, or sooner if needed

## 2012-12-31 NOTE — Assessment & Plan Note (Signed)
Followed per urology

## 2012-12-31 NOTE — Progress Notes (Signed)
Subjective:    Patient ID: Victor Davis, male    DOB: May 11, 1941, 72 y.o.   MRN: [id number]  HPI  Here for wellness and f/u;  Overall doing ok;  Pt denies CP, worsening SOB, DOE, wheezing, orthopnea, PND, worsening LE edema, palpitations, dizziness or syncope but does have ongoing months of uncomfortable swelling to the RLE that goes away at night.  LE venous doppler neg for dvt mar 2014.  Pt denies neurological change such as new headache, facial or extremity weakness.  Pt denies polydipsia, polyuria, or low sugar symptoms. Pt states overall good compliance with treatment and medications, good tolerability, and has been trying to follow lower cholesterol diet.  Pt denies worsening depressive symptoms, suicidal ideation or panic. No fever, night sweats, wt loss, loss of appetite, or other constitutional symptoms.  Pt states good ability with ADL's, has low fall risk, home safety reviewed and adequate, no other significant changes in hearing or vision, and only occasionally active with exercise.  Requip no longer working well for RLS, and taking a higher dose as a trial at home caused sleepiness and Gi upset. Did have a nsaid for pain that worked well, but told per Dr Alyse Low nurse Past Medical History  Diagnosis Date  . BRADYCARDIA 10/24/2010  . CALF PAIN, RIGHT 10/22/2009  . 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 Dysuria 10/10/2010  . ERECTILE DYSFUNCTION 04/05/2007  . FATIGUE 10/02/2007  . GLUCOSE INTOLERANCE 07/15/2008  . HYPERLIPIDEMIA 07/30/2008  . HYPERSOMNIA 10/02/2007  . INSOMNIA-SLEEP DISORDER-UNSPEC 10/22/2009  . LOW BACK PAIN 04/05/2007  . OSTEOARTHRITIS 04/05/2007  . PEPTIC ULCER DISEASE 04/05/2007  . PERIPHERAL EDEMA 10/22/2009  . PERIPHERAL VASCULAR DISEASE 07/30/2008  . PROSTATE CANCER, HX OF 04/05/2007  . RESTLESS LEG SYNDROME 05/15/2007  . RHINITIS, ALLERGIC NOS 05/15/2007  . SHOULDER PAIN, LEFT 01/22/2008  . SINUSITIS- ACUTE-NOS 05/15/2007  . Unspecified essential hypertension  04/05/2007  . Unspecified visual loss 07/15/2008  . UTI 10/24/2010  . Impaired glucose tolerance 02/09/2011  . Coronary artery disease      95% followed by 80% mid LAD stenosis, circumflex 80% stenosis. There was a moderate size branching obtuse marginal with 90-95% stenosis in the inferior branch. The right coronary artery had proximal 30% stenosis. Posterior lateral was moderate-sized with 40% stenosis. The EF was 65-70%. He had a DES and  x 2 to the LAD and DES x 2 to the circumflex 03/08/12.    [name] GERD (gastroesophageal reflux disease)   . Emphysema 04/01/2012    By CT chest, august 2013   Past Surgical History  Procedure Laterality Date  . Inguinal herniorrhapy left  2002    x 2  . Rotator cuff repair      left  . Knee surgery      left  . S/p ventral surgery  2009  . Prostatectomy  2002    radical  . Right hip replacement  09/22/09  . Left hip replacement    . Eye surgery      right    reports that he quit smoking about 35 years ago. His smoking use included Cigarettes. He has a 15 pack-year smoking history. He has never used smokeless tobacco. He reports that he drinks about 0.5 ounces of alcohol per week. He reports that he does not use illicit drugs. family history includes Heart attack (age of onset: 29) in his father; Heart attack (age of onset: 16) in his brother; and Lung cancer in his sister.  There is no history of  Colon cancer. Allergies  Allergen Reactions  . Amitriptyline Hcl     REACTION: confusion  . Diphenhydramine Hcl Other (See Comments)    Keeps him awake  . Simvastatin     REACTION: leg pain   Current Outpatient Prescriptions on File Prior to Visit  Medication Sig Dispense Refill  . amLODipine (NORVASC) 10 MG tablet TAKE 1 TABLET BY MOUTH EVERY DAY  30 tablet  8  . amLODipine (NORVASC) 10 MG tablet TAKE 1 TABLET BY MOUTH EVERY DAY  30 tablet  11  . aspirin 81 MG EC tablet Take 81 mg by mouth daily.        . cloNIDine (CATAPRES) 0.2 MG tablet TAKE 1 TABLET  (0.2 MG TOTAL) BY MOUTH 2 (TWO) TIMES DAILY.  60 tablet  3  . clopidogrel (PLAVIX) 75 MG tablet TAKE 1 TABLET BY MOUTH EVERY DAY  30 tablet  6  . famotidine (PEPCID) 20 MG tablet Take one tablet by mouth once a day.  60 tablet  8  . ibuprofen (ADVIL,MOTRIN) 200 MG tablet Take 400 mg by mouth every 6 (six) hours as needed. pain      . isosorbide mononitrate (IMDUR) 30 MG 24 hr tablet Take 1 tablet (30 mg total) by mouth daily.  30 tablet  6  . loratadine (CLARITIN) 10 MG tablet Take 10 mg by mouth daily.      [name] lovastatin (MEVACOR) 40 MG tablet TAKE 1 TABLET (40 MG TOTAL) BY MOUTH AT BEDTIME.  30 tablet  6  . Menthol-Methyl Salicylate (MUSCLE RUB) 10-15 % CREA Apply 1 application topically as needed. To arm for pain      . nitroGLYCERIN (NITROSTAT) 0.4 MG SL tablet Place 1 tablet (0.4 mg total) under the tongue every 5 (five) minutes as needed for chest pain.  25 tablet  3  . rOPINIRole (REQUIP) 2 MG tablet TAKE 1 AND 1/2 TABLET BY MOUTH 3 TIMES A DAY  135 tablet  5  . zolpidem (AMBIEN) 5 MG tablet Take 1 tablet (5 mg total) by mouth at bedtime as needed for sleep.  30 tablet  3   No current facility-administered medications on file prior to visit.   Review of Systems Constitutional: Negative for diaphoresis, activity change, appetite change or unexpected weight change.  HENT: Negative for hearing loss, ear pain, facial swelling, mouth sores and neck stiffness.   Eyes: Negative for pain, redness and visual disturbance.  Respiratory: Negative for shortness of breath and wheezing.   Cardiovascular: Negative for chest pain and palpitations.  Gastrointestinal: Negative for diarrhea, blood in stool, abdominal distention or other pain Genitourinary: Negative for hematuria, flank pain or change in urine volume.  Musculoskeletal: Negative for myalgias and joint swelling.  Skin: Negative for color change and wound.  Neurological: Negative for syncope and numbness. other than noted Hematological:  Negative for adenopathy.  Psychiatric/Behavioral: Negative for hallucinations, self-injury, decreased concentration and agitation.      Objective:   Physical Exam BP 150/72  Pulse 78  Temp(Src) 97.2 F (36.2 C) (Oral)  Ht 5\' 7"  (1.702 m)  Wt 163 lb 6 oz (74.106 kg)  BMI 25.58 kg/m2  SpO2 94% VS noted,  Constitutional: Pt is oriented to person, place, and time. Appears well-developed and well-nourished.  Head: Normocephalic and atraumatic.  Right Ear: External ear normal.  Left Ear: External ear normal.  Nose: Nose normal.  Mouth/Throat: Oropharynx is clear and moist.  Eyes: Conjunctivae and EOM are normal. Pupils are equal, round, and reactive  to light.  Neck: Normal range of motion. Neck supple. No JVD present. No tracheal deviation present.  Cardiovascular: Normal rate, regular rhythm, normal heart sounds and intact distal pulses.   Pulmonary/Chest: Effort normal and breath sounds normal.  Abdominal: Soft. Bowel sounds are normal. There is no tenderness. No HSM  Musculoskeletal: Normal range of motion. Exhibits trace to 1+ edema RLE to knees Lymphadenopathy:  Has no cervical adenopathy.  Neurological: Pt is alert and oriented to person, place, and time. Pt has normal reflexes. No cranial nerve deficit.  Skin: Skin is warm and dry. No rash noted. Has lesion to left tip of nose, plans to f/u with derm Psychiatric:  Has  normal mood and affect. Behavior is normal.         Assessment & Plan:

## 2013-01-20 ENCOUNTER — Other Ambulatory Visit: Payer: Self-pay | Admitting: Cardiovascular Disease

## 2013-01-25 ENCOUNTER — Other Ambulatory Visit (HOSPITAL_COMMUNITY): Payer: Self-pay | Admitting: Cardiovascular Disease

## 2013-02-05 ENCOUNTER — Other Ambulatory Visit (HOSPITAL_COMMUNITY): Payer: Self-pay | Admitting: Urology

## 2013-02-05 DIAGNOSIS — N281 Cyst of kidney, acquired: Secondary | ICD-10-CM

## 2013-03-17 ENCOUNTER — Other Ambulatory Visit (HOSPITAL_COMMUNITY): Payer: Self-pay | Admitting: Urology

## 2013-03-17 ENCOUNTER — Ambulatory Visit (HOSPITAL_COMMUNITY): Admission: RE | Admit: 2013-03-17 | Payer: Medicare Other | Source: Ambulatory Visit

## 2013-03-17 ENCOUNTER — Ambulatory Visit (HOSPITAL_COMMUNITY)
Admission: RE | Admit: 2013-03-17 | Discharge: 2013-03-17 | Disposition: A | Discharge status: Home / Self Care | Payer: Medicare Other | Source: Ambulatory Visit | Attending: Urology | Admitting: Urology

## 2013-03-17 DIAGNOSIS — N281 Cyst of kidney, acquired: Secondary | ICD-10-CM

## 2013-03-17 DIAGNOSIS — M47817 Spondylosis without myelopathy or radiculopathy, lumbosacral region: Secondary | ICD-10-CM | POA: Insufficient documentation

## 2013-03-17 DIAGNOSIS — N289 Disorder of kidney and ureter, unspecified: Secondary | ICD-10-CM | POA: Insufficient documentation

## 2013-04-08 ENCOUNTER — Encounter: Payer: Self-pay | Admitting: Internal Medicine

## 2013-04-08 ENCOUNTER — Ambulatory Visit (INDEPENDENT_AMBULATORY_CARE_PROVIDER_SITE_OTHER): Payer: Medicare Other | Admitting: Internal Medicine

## 2013-04-08 ENCOUNTER — Ambulatory Visit (INDEPENDENT_AMBULATORY_CARE_PROVIDER_SITE_OTHER)
Admission: RE | Admit: 2013-04-08 | Discharge: 2013-04-08 | Disposition: A | Discharge status: Home / Self Care | Payer: Medicare Other | Source: Ambulatory Visit | Attending: Internal Medicine | Admitting: Internal Medicine

## 2013-04-08 VITALS — BP 120/78 | HR 65 | Temp 97.6°F | Ht 68.0 in | Wt 158.5 lb

## 2013-04-08 DIAGNOSIS — Z23 Encounter for immunization: Secondary | ICD-10-CM

## 2013-04-08 DIAGNOSIS — Z0000 Encounter for general adult medical examination without abnormal findings: Secondary | ICD-10-CM

## 2013-04-08 DIAGNOSIS — R911 Solitary pulmonary nodule: Secondary | ICD-10-CM

## 2013-04-08 DIAGNOSIS — J3489 Other specified disorders of nose and nasal sinuses: Secondary | ICD-10-CM

## 2013-04-08 DIAGNOSIS — G2581 Restless legs syndrome: Secondary | ICD-10-CM

## 2013-04-08 NOTE — Assessment & Plan Note (Signed)
stable overall by history and exam, and pt to continue medical treatment as before,  to f/u any worsening symptoms or concerns 

## 2013-04-08 NOTE — Assessment & Plan Note (Signed)
For derm referral

## 2013-04-08 NOTE — Assessment & Plan Note (Signed)
For chest ct asd,  to f/u any worsening symptoms or concerns

## 2013-04-08 NOTE — Progress Notes (Signed)
Subjective:    Patient ID: Victor Davis, male    DOB: 23-Jul-1941, 72 y.o.   MRN: [id number]  HPI  Here with wife for f/u, overall doing ok.  Pt denies chest pain, increased sob or doe, wheezing, orthopnea, PND, increased LE swelling, palpitations, dizziness or syncope.   Pt denies polydipsia, polyuria,  Pt denies new neurological symptoms such as new headache, or facial or extremity weakness or numbness  Due for f/u CT - pulm nodole.  Med working ok for RLS, only takes qhs, doesnot work quite as well as he had hoped but declines any chagne.  Unfortuanately skin lesion left nose enlarged, more prominent.  Per pt has not received the letter from card for f/u appt. Due for colonoscopy, has now been on plavix for 1 yr.   Past Medical History  Diagnosis Date  . BRADYCARDIA 10/24/2010  . CALF PAIN, RIGHT 10/22/2009  . 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 Dysuria 10/10/2010  . ERECTILE DYSFUNCTION 04/05/2007  . FATIGUE 10/02/2007  . GLUCOSE INTOLERANCE 07/15/2008  . HYPERLIPIDEMIA 07/30/2008  . HYPERSOMNIA 10/02/2007  . INSOMNIA-SLEEP DISORDER-UNSPEC 10/22/2009  . LOW BACK PAIN 04/05/2007  . OSTEOARTHRITIS 04/05/2007  . PEPTIC ULCER DISEASE 04/05/2007  . PERIPHERAL EDEMA 10/22/2009  . PERIPHERAL VASCULAR DISEASE 07/30/2008  . PROSTATE CANCER, HX OF 04/05/2007  . RESTLESS LEG SYNDROME 05/15/2007  . RHINITIS, ALLERGIC NOS 05/15/2007  . SHOULDER PAIN, LEFT 01/22/2008  . SINUSITIS- ACUTE-NOS 05/15/2007  . Unspecified essential hypertension 04/05/2007  . Unspecified visual loss 07/15/2008  . UTI 10/24/2010  . Impaired glucose tolerance 02/09/2011  . Coronary artery disease      95% followed by 80% mid LAD stenosis, circumflex 80% stenosis. There was a moderate size branching obtuse marginal with 90-95% stenosis in the inferior branch. The right coronary artery had proximal 30% stenosis. Posterior lateral was moderate-sized with 40% stenosis. The EF was 65-70%. He had a DES and  x 2 to the LAD and DES x 2 to the  circumflex 03/08/12.    [name] GERD (gastroesophageal reflux disease)   . Emphysema 04/01/2012    By CT chest, august 2013   Past Surgical History  Procedure Laterality Date  . Inguinal herniorrhapy left  2002    x 2  . Rotator cuff repair      left  . Knee surgery      left  . S/p ventral surgery  2009  . Prostatectomy  2002    radical  . Right hip replacement  09/22/09  . Left hip replacement    . Eye surgery      right    reports that he quit smoking about 36 years ago. His smoking use included Cigarettes. He has a 15 pack-year smoking history. He has never used smokeless tobacco. He reports that he drinks about 0.5 ounces of alcohol per week. He reports that he does not use illicit drugs. family history includes Heart attack (age of onset: 86) in his father; Heart attack (age of onset: 29) in his brother; Lung cancer in his sister. There is no history of Colon cancer. Allergies  Allergen Reactions  . Amitriptyline Hcl     REACTION: confusion  . Diphenhydramine Hcl Other (See Comments)    Keeps him awake  . Simvastatin     REACTION: leg pain   Current Outpatient Prescriptions on File Prior to Visit  Medication Sig Dispense Refill  . amLODipine (NORVASC) 10 MG tablet TAKE 1 TABLET BY MOUTH EVERY DAY  30 tablet  11  . aspirin 81 MG EC tablet Take 81 mg by mouth daily.        . cloNIDine (CATAPRES) 0.2 MG tablet TAKE 1 TABLET (0.2 MG TOTAL) BY MOUTH 2 (TWO) TIMES DAILY.  60 tablet  3  . clopidogrel (PLAVIX) 75 MG tablet TAKE 1 TABLET BY MOUTH EVERY DAY  30 tablet  6  . famotidine (PEPCID) 20 MG tablet Take one tablet by mouth once a day.  60 tablet  8  . ibuprofen (ADVIL,MOTRIN) 200 MG tablet Take 400 mg by mouth every 6 (six) hours as needed. pain      . isosorbide mononitrate (IMDUR) 30 MG 24 hr tablet Take 1 tablet (30 mg total) by mouth daily.  30 tablet  6  . loratadine (CLARITIN) 10 MG tablet Take 10 mg by mouth daily.      [name] lovastatin (MEVACOR) 40 MG tablet TAKE 1 TABLET (40  MG TOTAL) BY MOUTH AT BEDTIME.  30 tablet  6  . Menthol-Methyl Salicylate (MUSCLE RUB) 10-15 % CREA Apply 1 application topically as needed. To arm for pain      . pramipexole (MIRAPEX) 1 MG tablet Take 1 tablet (1 mg total) by mouth 3 (three) times daily.  90 tablet  11  . nitroGLYCERIN (NITROSTAT) 0.4 MG SL tablet Place 1 tablet (0.4 mg total) under the tongue every 5 (five) minutes as needed for chest pain.  25 tablet  3  . zolpidem (AMBIEN) 5 MG tablet Take 1 tablet (5 mg total) by mouth at bedtime as needed for sleep.  30 tablet  3   No current facility-administered medications on file prior to visit.   Review of Systems  Constitutional: Negative for unexpected weight change, or unusual diaphoresis  HENT: Negative for tinnitus.   Eyes: Negative for photophobia and visual disturbance.  Respiratory: Negative for choking and stridor.   Gastrointestinal: Negative for vomiting and blood in stool.  Genitourinary: Negative for hematuria and decreased urine volume.  Musculoskeletal: Negative for acute joint swelling Skin: Negative for color change and wound.  Neurological: Negative for tremors and numbness other than noted  Psychiatric/Behavioral: Negative for decreased concentration or  hyperactivity.       Objective:   Physical Exam BP 120/78  Pulse 65  Temp(Src) 97.6 F (36.4 C) (Oral)  Ht 5\' 8"  (1.727 m)  Wt 158 lb 8 oz (71.895 kg)  BMI 24.11 kg/m2  SpO2 97% VS noted, not ill appearing Constitutional: Pt appears well-developed and well-nourished.  HENT: Head: NCAT.  Right Ear: External ear normal.  Left Ear: External ear normal.  Eyes: Conjunctivae and EOM are normal. Pupils are equal, round, and reactive to light.  Neck: Normal range of motion. Neck supple.  Cardiovascular: Normal rate and regular rhythm.   Pulmonary/Chest: Effort normal and breath sounds normal.  Abd:  Soft, NT, non-distended, + BS Neurological: Pt is alert. Not confused  Skin: Skin is warm. No erythema.  Left nasal skin lesion nontender  Psychiatric: Pt behavior is normal. Thought content normal.     Assessment & Plan:

## 2013-04-08 NOTE — Patient Instructions (Signed)
You had the flu shot today You will be contacted regarding the referral for: CT for the lung nodule, dermatology, and GI for the office visit to see about getting the colonoscopy done  Please continue all other medications as before, and refills have been done if requested.  Please remember to sign up for My Chart if you have not done so, as this will be important to you in the future with finding out test results, communicating by private email, and scheduling acute appointments online when needed.

## 2013-04-08 NOTE — Assessment & Plan Note (Signed)
Not charged, due for f/u colonscopy, will need ok per cardiology but suspect ok for off plavix temporarily

## 2013-04-15 ENCOUNTER — Other Ambulatory Visit: Payer: Self-pay

## 2013-04-15 NOTE — Telephone Encounter (Signed)
isosorbide mononitrate (IMDUR) 30 MG 24 hr tablet 30 tablet 6 11/20/2012 11/20/2013  Sig - Route:  Take 1 tablet (30 mg total) by mouth daily. - Oral  Class:  Normal  Authorizing Provider:  Burnell Blanks, MD  Ordering User:  Lamar Laundry, [place]  Visit Pharmacy  [id number] - [place], [place]

## 2013-04-16 ENCOUNTER — Other Ambulatory Visit: Payer: Self-pay

## 2013-04-16 MED ORDER — ISOSORBIDE MONONITRATE ER 30 MG PO TB24: 30.0000 mg | ORAL_TABLET | Freq: Every day | ORAL | Status: DC

## 2013-04-23 ENCOUNTER — Encounter: Payer: Self-pay | Admitting: *Deleted

## 2013-04-25 ENCOUNTER — Telehealth: Payer: Self-pay | Admitting: Physician Assistant

## 2013-04-25 ENCOUNTER — Encounter: Payer: Self-pay | Admitting: Physician Assistant

## 2013-04-25 ENCOUNTER — Ambulatory Visit (INDEPENDENT_AMBULATORY_CARE_PROVIDER_SITE_OTHER): Payer: Medicare Other | Admitting: Physician Assistant

## 2013-04-25 VITALS — BP 150/80 | HR 62 | Ht 68.0 in | Wt 158.0 lb

## 2013-04-25 DIAGNOSIS — I10 Essential (primary) hypertension: Secondary | ICD-10-CM

## 2013-04-25 DIAGNOSIS — E785 Hyperlipidemia, unspecified: Secondary | ICD-10-CM

## 2013-04-25 DIAGNOSIS — I2510 Atherosclerotic heart disease of native coronary artery without angina pectoris: Secondary | ICD-10-CM

## 2013-04-25 NOTE — Telephone Encounter (Signed)
New Problem  pt has to go off of plavix for 7 days //this is the Dr.'s information. Pt states this information was requested by Richardson Dopp.   [phone] Dr. Nell Range

## 2013-04-25 NOTE — Telephone Encounter (Signed)
Returned call to patient's wife Dr.McAlhany advised ok to hold Plavix 7 days before colonoscopy.

## 2013-04-25 NOTE — Patient Instructions (Addendum)
NO CHANGES WERE MADE TODAY  PLEASE LET us KNOW AS SOON AS YOU KNOW WHO THE GASTROENTEROLOGIST IS WHO WILL BE DOING YOUR PROCEDURE SO THAT WE MAY SEND OVER THE OFFICE NOTE FROM TODAY  PLEASE FOLLOW UP WITH DR. Angelena Form IN 6 MONTHS

## 2013-04-25 NOTE — Telephone Encounter (Signed)
Returned call to patient spoke to wife she stated patient needs colonoscopy by Dr.Byrtle and was told told hold plavix for 7 days.Stated wanted to make sure ok with Dr.McAlhany.Message sent to [place].

## 2013-04-25 NOTE — Progress Notes (Signed)
Should be ok scott to hold Plavix. Thanks for seeing him. chris

## 2013-04-25 NOTE — Progress Notes (Signed)
Victor Davis. [address], [address] [place],   [address] Phone: [phone] Fax:  [phone]  Date:  04/25/2013   ID:  Victor Davis, DOB 1940/11/18, MRN [id number]  PCP:  Cathlean Cower, MD  Cardiologist:  Dr. Lauree Chandler     History of Present Illness: Victor Davis is a 72 y.o. male who returns for follow up.  He has a hx of CAD, CVA, HTN, HL, PAD, prostate CA. He was seen as a new patient 02/06/12 for evaluation of abnormal chest CT. His chest CT showed calcification of the aorta and coronary arteries. He described occasional chest pains that occurred at rest and with exertion. No SOB. LHC 02/12/12 showed severe LAD and Circumflex disease. PCI delayed because of renal insufficiency and while he was having a renal cyst workup.  He was brought back on 03/08/12 and placed 2 DES in the LAD and 2 DES in the Circumflex. He did well following the PCI.   Last seen by Dr. Lauree Chandler 3/14.  Since then, he has done well. He does have some discomfort in his chest after eating spicy foods. He denies exertional chest symptoms. He continues to bowl weekly. He denies significant dyspnea. He is NYHA class II. He denies orthopnea, PND. He continues to have right lower extremity edema without significant change. He denies syncope.  He needs a screening colonoscopy. He will need to come off Plavix for this.  Labs (5/14):  K 4, creatinine 1.3, ALT 17, HDL 27.9, LDL 55, Hgb 11.9, PLT 146K  Wt Readings from Last 3 Encounters:  04/25/13 158 lb (71.668 kg)  04/08/13 158 lb 8 oz (71.895 kg)  12/31/12 163 lb 6 oz (74.106 kg)     Past Medical History  Diagnosis Date  . BRADYCARDIA 10/24/2010  . CALF PAIN, RIGHT 10/22/2009  . 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 Dysuria 10/10/2010  . ERECTILE DYSFUNCTION 04/05/2007  . FATIGUE 10/02/2007  . GLUCOSE INTOLERANCE 07/15/2008  . HYPERLIPIDEMIA 07/30/2008  . HYPERSOMNIA 10/02/2007  . INSOMNIA-SLEEP DISORDER-UNSPEC 10/22/2009  . LOW BACK PAIN  04/05/2007  . OSTEOARTHRITIS 04/05/2007  . PEPTIC ULCER DISEASE 04/05/2007  . PERIPHERAL EDEMA 10/22/2009  . PERIPHERAL VASCULAR DISEASE 07/30/2008  . PROSTATE CANCER, HX OF 04/05/2007  . RESTLESS LEG SYNDROME 05/15/2007  . RHINITIS, ALLERGIC NOS 05/15/2007  . SHOULDER PAIN, LEFT 01/22/2008  . SINUSITIS- ACUTE-NOS 05/15/2007  . Unspecified essential hypertension 04/05/2007  . Unspecified visual loss 07/15/2008  . UTI 10/24/2010  . Impaired glucose tolerance 02/09/2011  . Coronary artery disease      95% followed by 80% mid LAD stenosis, circumflex 80% stenosis. There was a moderate size branching obtuse marginal with 90-95% stenosis in the inferior branch. The right coronary artery had proximal 30% stenosis. Posterior lateral was moderate-sized with 40% stenosis. The EF was 65-70%. He had a DES and  x 2 to the LAD and DES x 2 to the circumflex 03/08/12.    [name] GERD (gastroesophageal reflux disease)   . Emphysema 04/01/2012    By CT chest, august 2013    Current Outpatient Prescriptions  Medication Sig Dispense Refill  . amLODipine (NORVASC) 10 MG tablet TAKE 1 TABLET BY MOUTH EVERY DAY  30 tablet  11  . aspirin 81 MG EC tablet Take 81 mg by mouth daily.        . cloNIDine (CATAPRES) 0.2 MG tablet TAKE 1 TABLET (0.2 MG TOTAL) BY MOUTH 2 (TWO) TIMES DAILY.  60 tablet  3  . clopidogrel (PLAVIX)  75 MG tablet TAKE 1 TABLET BY MOUTH EVERY DAY  30 tablet  6  . famotidine (PEPCID) 20 MG tablet Take one tablet by mouth once a day.  60 tablet  8  . ibuprofen (ADVIL,MOTRIN) 200 MG tablet Take 400 mg by mouth every 6 (six) hours as needed. pain      . isosorbide mononitrate (IMDUR) 30 MG 24 hr tablet Take 1 tablet (30 mg total) by mouth daily.  30 tablet  3  . loratadine (CLARITIN) 10 MG tablet Take 10 mg by mouth daily.      [name] lovastatin (MEVACOR) 40 MG tablet TAKE 1 TABLET (40 MG TOTAL) BY MOUTH AT BEDTIME.  30 tablet  6  . Menthol-Methyl Salicylate (MUSCLE RUB) 10-15 % CREA Apply 1 application topically as  needed. To arm for pain      . nitroGLYCERIN (NITROSTAT) 0.4 MG SL tablet Place 1 tablet (0.4 mg total) under the tongue every 5 (five) minutes as needed for chest pain.  25 tablet  3  . pramipexole (MIRAPEX) 1 MG tablet Take 1 tablet (1 mg total) by mouth 3 (three) times daily.  90 tablet  11  . zolpidem (AMBIEN) 5 MG tablet Take 1 tablet (5 mg total) by mouth at bedtime as needed for sleep.  30 tablet  3   No current facility-administered medications for this visit.    Allergies:    Allergies  Allergen Reactions  . Amitriptyline Hcl     REACTION: confusion  . Diphenhydramine Hcl Other (See Comments)    Keeps him awake  . Simvastatin     REACTION: leg pain    Social History:  The patient  reports that he quit smoking about 36 years ago. His smoking use included Cigarettes. He has a 15 pack-year smoking history. He has never used smokeless tobacco. He reports that he drinks about 0.5 ounces of alcohol per week. He reports that he does not use illicit drugs.   ROS:  Please see the history of present illness.      All other systems reviewed and negative.   PHYSICAL EXAM: VS:  BP 150/80  Pulse 62  Ht 5\' 8"  (1.727 m)  Wt 158 lb (71.668 kg)  BMI 24.03 kg/m2 Well nourished, well developed, in no acute distress HEENT: normal Neck: no JVD Cardiac:  normal S1, S2; RRR; no murmur Lungs:  clear to auscultation bilaterally, no wheezing, rhonchi or rales Abd: soft, nontender, no hepatomegaly Ext: trace RLE edema Skin: warm and dry Neuro:  CNs 2-12 intact, no focal abnormalities noted  EKG:  NSR, HR 62, no acute changes     Echocardiogram 07/16/08: SUMMARY - Overall left ventricular systolic function was normal. Left ventricular ejection fraction was estimated , range being 60 % to 65 %. - There was mild mitral valvular regurgitation.   Myoview 02/07/12: Impression  Exercise Capacity: Lexiscan with low level exercise.  BP Response: Normal blood pressure response.  Clinical  Symptoms: There is CP.  ECG Impression: Baseline ECG is abnormal with septal Qs and marked anterolateral TWI. With stress there is 1-2 mm ST depression inferior and lateral leads that resolves with rest.  Comparison with Prior Nuclear Study: No previous nuclear study performed   Overall Impression: Low risk stress nuclear study. Perfusion imaging is normal. However there is 1-2 mm of inferolateral ST depression on ECG with stress.  LV Ejection Fraction: 50%. LV Wall Motion: Normal   LHC 02/12/12: Angiographic Findings:  Left main: No obstructive disease noted.  Left Anterior Descending Artery: Large caliber vessel that courses to the apex. The proximal vessel has diffuse 40% stenosis. The mid vessel has a 95% stenosis followed by 80% stenosis. There are 3 small caliber diagonal branches that arise from the mid vessel. There is a large septal perforating branch.  Circumflex Artery:Moderate sized vessel with 80% proximal stenosis. The first OM is very small. The AV groove Circumflex terminates in a moderate sized bifurcating obtuse marginal branch. The inferior sub-branch has a 90-95% stenosis.  Right Coronary Artery: Large, dominant vessel with diffuse 30% stenosis in the proximal, mid and distal vessel. The PDA is moderate sized and patent. The PL branch is moderate sized and has a proximal 40% stenosis.  Left Ventricular Angiogram: LVEF 65-70%.   Percutaneous Coronary Intervention 03/08/12: Procedure Performed:  1. PTCA/DES x 2 proximal and mid LAD (Promus Element) 2. PTCA/DES x 1 mid Circumflex (Promus Element) 3. PTCA/DES x 1 proximal Circumflex (Promus Element)   Carotid US 3/14:  0-39% bilaterally   ASSESSMENT AND PLAN:  1. CAD: He is 1 year out from his PCI. I suspect it would be acceptable for him to come off of Plavix 7 days prior to his colonoscopy. However, he had multiple stents placed in 2 vessels. I will review this with Dr. Lauree Chandler before making a final decision.  He will need to remain on aspirin indefinitely. Continue statin. 2. Hypertension: Borderline control. Continue current therapy. 3. Hyperlipidemia: Continue statin. 4. Carotid Stenosis: Mild plaque by recent Dopplers. Repeat 10/2013. 5. Disposition: Followup with Dr. Lauree Chandler in 6 mos.   Signed, Richardson Dopp, PA-C  04/25/2013 10:49 AM

## 2013-04-25 NOTE — Telephone Encounter (Signed)
Should be ok to hold Plavix for 7 days before procedure. cdm

## 2013-05-01 ENCOUNTER — Telehealth: Payer: Self-pay | Admitting: *Deleted

## 2013-05-01 NOTE — Telephone Encounter (Signed)
pt notified ok per Dr. Angelena Form and Brynda Rim. PA ok to d/c plavix, though to make sure DO NOT STOP ASA. Pt verbalized understanding to Plan of Care

## 2013-05-20 ENCOUNTER — Other Ambulatory Visit: Payer: Self-pay | Admitting: Physician Assistant

## 2013-05-21 ENCOUNTER — Encounter: Payer: Self-pay | Admitting: Internal Medicine

## 2013-05-21 ENCOUNTER — Ambulatory Visit (AMBULATORY_SURGERY_CENTER): Payer: Medicare Other

## 2013-05-21 VITALS — Ht 68.0 in | Wt 155.0 lb

## 2013-05-21 DIAGNOSIS — Z1211 Encounter for screening for malignant neoplasm of colon: Secondary | ICD-10-CM

## 2013-05-21 MED ORDER — MOVIPREP 100 G PO SOLR: 1.0000 | Freq: Once | ORAL | Status: DC

## 2013-05-23 ENCOUNTER — Telehealth: Payer: Self-pay | Admitting: Internal Medicine

## 2013-05-23 NOTE — Telephone Encounter (Signed)
Informed pt I will give him a voucher for a free prep if he wishes. Pt will come pick up the voucher.

## 2013-05-25 ENCOUNTER — Other Ambulatory Visit: Payer: Self-pay | Admitting: Internal Medicine

## 2013-05-28 ENCOUNTER — Ambulatory Visit (AMBULATORY_SURGERY_CENTER): Payer: Medicare Other | Admitting: Internal Medicine

## 2013-05-28 ENCOUNTER — Encounter: Payer: Self-pay | Admitting: Internal Medicine

## 2013-05-28 VITALS — BP 133/61 | HR 55 | Temp 97.1°F | Resp 21 | Ht 68.0 in | Wt 155.0 lb

## 2013-05-28 DIAGNOSIS — D126 Benign neoplasm of colon, unspecified: Secondary | ICD-10-CM

## 2013-05-28 DIAGNOSIS — Z1211 Encounter for screening for malignant neoplasm of colon: Secondary | ICD-10-CM

## 2013-05-28 MED ORDER — SODIUM CHLORIDE 0.9 % IV SOLN: 500.0000 mL | INTRAVENOUS | Status: DC

## 2013-05-28 NOTE — Progress Notes (Signed)
Called to room to assist during endoscopic procedure.  Patient ID and intended procedure confirmed with present staff. Received instructions for my participation in the procedure from the performing physician.  

## 2013-05-28 NOTE — Progress Notes (Signed)
I did not see PLAVIX on his medication list.  I asked his wife if he was taking PLAVIX and she said yes.  His last dose of PLAVIX was 05-20-13.  I added this to his medication list and advised Dr. Hilarie Fredrickson.  Per Dr. Hilarie Fredrickson hold plavix tonight and restart it tomorrow night.  Pt and his waife are aware of this.  Also ok to continue asa 81mg  but hold any other anti-inflammatory medications.  No complaints noted in the recovery room. Maw

## 2013-05-28 NOTE — Op Note (Addendum)
[place]  [address]. [place], [address]   COLONOSCOPY PROCEDURE REPORT  PATIENT: Victor Davis, Victor Davis  MR#: [id number] BIRTHDATE: 07-27-1941 , 72  yrs. old GENDER: Male ENDOSCOPIST: Jerene Bears, MD REFERRED [id number] John, M.D. PROCEDURE DATE:  05/28/2013 PROCEDURE:   Colonoscopy with snare polypectomy and Colonoscopy with cold biopsy polypectomy First Screening Colonoscopy - Avg.  risk and is 50 yrs.  old or older - No.  Prior Negative Screening - Now for repeat screening. 10 or more years since last screening  History of Adenoma - Now for follow-up colonoscopy & has been > or = to 3 yrs.  N/A  Polyps Removed Today? Yes. ASA CLASS:   Class III INDICATIONS:average risk screening. MEDICATIONS: MAC sedation, administered by CRNA, Propofol (Diprivan), and propofol (Diprivan) 350mg  IV  DESCRIPTION OF PROCEDURE:   After the risks benefits and alternatives of the procedure were thoroughly explained, informed consent was obtained.  A digital rectal exam revealed no rectal mass.   The LB [id number]  endoscope was introduced through the anus and advanced to the cecum, which was identified by both the appendix and ileocecal valve. No adverse events experienced. The quality of the prep was good, using MoviPrep  The instrument was then slowly withdrawn as the colon was fully examined.   COLON FINDINGS: Three sessile polyps measuring 3-6 mm in size were found in the ascending colon, transverse colon, and descending colon.  Polypectomy was performed with cold forceps (1 ascending) and using cold snare (2).  All resections were complete and all polyp tissue was completely retrieved.   There was moderate diverticulosis noted in the ascending colon, transverse colon, descending colon, and sigmoid colon with associated angulation. Retroflexion was not performed due to a narrow rectal vault. The time to cecum=4 minutes 11 seconds.  Withdrawal time=10 minutes  24 seconds.  The scope was withdrawn and the procedure completed.  COMPLICATIONS: There were no complications.  ENDOSCOPIC IMPRESSION: 1.   Three sessile polyps measuring 3-6 mm in size were found in the ascending colon, transverse colon, and descending colon; Polypectomy was performed with cold forceps and using cold snare 2.   There was moderate diverticulosis noted in the ascending colon, transverse colon, descending colon, and sigmoid colon  RECOMMENDATIONS: 1.  Avoid all NSAIDS for the next 2 weeks. 2.  Await pathology results 3.  High fiber diet 4.  Timing of repeat colonoscopy will be determined by pathology findings. 5.  You will receive a letter within 1-2 weeks with the results of your biopsy as well as final recommendations.  Please call my office if you have not received a letter after 3 weeks.   eSigned:  Jerene Bears, MD 05/28/2013 2:14 PM Revised: 05/28/2013 2:14 PM  cc: The Patient and Biagio Borg, MD   PATIENT NAME:  Victor Davis, Victor Davis MR#: [id number]

## 2013-05-28 NOTE — Progress Notes (Signed)
Procedure ends, to recovery, report given and VSS. 

## 2013-05-28 NOTE — Patient Instructions (Addendum)
YOU HAD AN ENDOSCOPIC PROCEDURE TODAY AT THE [facility]: Refer to the procedure report that was given to you for any specific questions about what was found during the examination.  If the procedure report does not answer your questions, please call your gastroenterologist to clarify.  If you requested that your care partner not be given the details of your procedure findings, then the procedure report has been included in a sealed envelope for you to review at your convenience later.  YOU SHOULD EXPECT: Some feelings of bloating in the abdomen. Passage of more gas than usual.  Walking can help get rid of the air that was put into your GI tract during the procedure and reduce the bloating. If you had a lower endoscopy (such as a colonoscopy or flexible sigmoidoscopy) you may notice spotting of blood in your stool or on the toilet paper. If you underwent a bowel prep for your procedure, then you may not have a normal bowel movement for a few days.  DIET: Your first meal following the procedure should be a light meal and then it is ok to progress to your normal diet.  A half-sandwich or bowl of soup is an example of a good first meal.  Heavy or fried foods are harder to digest and may make you feel nauseous or bloated.  Likewise meals heavy in dairy and vegetables can cause extra gas to form and this can also increase the bloating.  Drink plenty of fluids but you should avoid alcoholic beverages for 24 hours.  ACTIVITY: Your care partner should take you home directly after the procedure.  You should plan to take it easy, moving slowly for the rest of the day.  You can resume normal activity the day after the procedure however you should NOT DRIVE or use heavy machinery for 24 hours (because of the sedation medicines used during the test).    SYMPTOMS TO REPORT IMMEDIATELY: A gastroenterologist can be reached at any hour.  During normal business hours, 8:30 AM to 5:00 PM Monday through Friday,  call [phone].  After hours and on weekends, please call the GI answering service at [phone] who will take a message and have the physician on call contact you.   Following lower endoscopy (colonoscopy or flexible sigmoidoscopy):  Excessive amounts of blood in the stool  Significant tenderness or worsening of abdominal pains  Swelling of the abdomen that is new, acute  Fever of 100F or higher   FOLLOW UP: If any biopsies were taken you will be contacted by phone or by letter within the next 1-3 weeks.  Call your gastroenterologist if you have not heard about the biopsies in 3 weeks.  Our staff will call the home number listed on your records the next business day following your procedure to check on you and address any questions or concerns that you may have at that time regarding the information given to you following your procedure. This is a courtesy call and so if there is no answer at the home number and we have not heard from you through the emergency physician on call, we will assume that you have returned to your regular daily activities without incident.  SIGNATURES/CONFIDENTIALITY: You and/or your care partner have signed paperwork which will be entered into your electronic medical record.  These signatures attest to the fact that that the information above on your After Visit Summary has been reviewed and is understood.  Full responsibility of the confidentiality of   this discharge information lies with you and/or your care-partner.   Handouts were given to your care partner on polyps, diverticulosis, and a high fiber diet. Per Dr. Hilarie Fredrickson you may resume your PLAVIX 75 mg tomorrow evening.  You may also resume your other current medications today expect for  any antiinflammatory medication for the next 2 weeks until 06-11-13. Await pathology results. Please call if any questions or concerns.

## 2013-05-29 ENCOUNTER — Telehealth: Payer: Self-pay | Admitting: *Deleted

## 2013-05-29 NOTE — Telephone Encounter (Signed)
No identifier, left message, follow-up  

## 2013-06-04 ENCOUNTER — Encounter: Payer: Self-pay | Admitting: Internal Medicine

## 2013-06-05 ENCOUNTER — Other Ambulatory Visit: Payer: Self-pay | Admitting: Internal Medicine

## 2013-06-29 ENCOUNTER — Other Ambulatory Visit: Payer: Self-pay | Admitting: Cardiovascular Disease

## 2013-07-28 ENCOUNTER — Other Ambulatory Visit: Payer: Self-pay | Admitting: Dermatology

## 2013-08-23 ENCOUNTER — Other Ambulatory Visit: Payer: Self-pay | Admitting: Cardiovascular Disease

## 2013-08-25 ENCOUNTER — Other Ambulatory Visit: Payer: Self-pay | Admitting: Cardiovascular Disease

## 2013-09-11 ENCOUNTER — Other Ambulatory Visit: Payer: Self-pay | Admitting: Dermatology

## 2013-09-20 ENCOUNTER — Other Ambulatory Visit (HOSPITAL_COMMUNITY): Payer: Self-pay | Admitting: Cardiovascular Disease

## 2013-10-09 ENCOUNTER — Ambulatory Visit: Payer: Medicare Other | Admitting: Internal Medicine

## 2013-10-15 ENCOUNTER — Encounter: Payer: Self-pay | Admitting: Internal Medicine

## 2013-10-15 ENCOUNTER — Ambulatory Visit (INDEPENDENT_AMBULATORY_CARE_PROVIDER_SITE_OTHER): Payer: Medicare Other | Admitting: Internal Medicine

## 2013-10-15 VITALS — BP 160/80 | HR 73 | Temp 98.0°F | Ht 68.0 in | Wt 162.2 lb

## 2013-10-15 DIAGNOSIS — I10 Essential (primary) hypertension: Secondary | ICD-10-CM

## 2013-10-15 DIAGNOSIS — Z0000 Encounter for general adult medical examination without abnormal findings: Secondary | ICD-10-CM

## 2013-10-15 DIAGNOSIS — Z23 Encounter for immunization: Secondary | ICD-10-CM

## 2013-10-15 MED ORDER — CITALOPRAM HYDROBROMIDE 20 MG PO TABS: 20.0000 mg | ORAL_TABLET | Freq: Every day | ORAL | Status: DC

## 2013-10-15 NOTE — Addendum Note (Signed)
Addended by: Sharon Seller B on: 10/15/2013 02:34 PM   Modules accepted: Orders

## 2013-10-15 NOTE — Assessment & Plan Note (Signed)
Uncontrolled, and c/o some sedation with clonidine 0.2 but delicnes change today, to f/u with card mar 27

## 2013-10-15 NOTE — Progress Notes (Signed)
Pre visit review using our clinic review tool, if applicable. No additional management support is needed unless otherwise documented below in the visit note. 

## 2013-10-15 NOTE — Progress Notes (Signed)
Subjective:    Patient ID: Victor Davis, male    DOB: 09-11-1940, 73 y.o.   MRN: [id number]  HPI  Here for wellness and f/u;  Overall doing ok;  Pt denies CP, worsening SOB, DOE, wheezing, orthopnea, PND, worsening LE edema, palpitations, dizziness or syncope.  Pt denies neurological change such as new headache, facial or extremity weakness.  Pt denies polydipsia, polyuria, or low sugar symptoms. Pt states overall good compliance with treatment and medications, good tolerability, and has been trying to follow lower cholesterol diet.  Pt denies worsening depressive symptoms, suicidal ideation or panic. No fever, night sweats, wt loss, loss of appetite, or other constitutional symptoms.  Pt states good ability with ADL's, has low fall risk, home safety reviewed and adequate, no other significant changes in hearing or vision, and only occasionally active with exercise. Sees Dr Woodruff/urology with good report recently on labs and exam including lft, bmet, psa per pt.  Has f/u appt with card mar 27, plans to bring up idea of some sedation with the clonidine, and maybe an alternative.  BP elev today, declines other med change now.  Had recently labs with urology, declines further today.  Has been taking citalopram 40 mg - 1/2 qd after wife had dose changed, mostly for his nervous and anger, and already in 2 wks is much improved, wants to continue this.  Was told per urololgy per pt it prob would not hurt him, so he tried it Past Medical History  Diagnosis Date  . BRADYCARDIA 10/24/2010  . CALF PAIN, RIGHT 10/22/2009  . 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 Dysuria 10/10/2010  . ERECTILE DYSFUNCTION 04/05/2007  . FATIGUE 10/02/2007  . GLUCOSE INTOLERANCE 07/15/2008  . HYPERLIPIDEMIA 07/30/2008  . HYPERSOMNIA 10/02/2007  . INSOMNIA-SLEEP DISORDER-UNSPEC 10/22/2009  . LOW BACK PAIN 04/05/2007  . OSTEOARTHRITIS 04/05/2007  . PEPTIC ULCER DISEASE 04/05/2007  . PERIPHERAL EDEMA 10/22/2009  . PERIPHERAL VASCULAR  DISEASE 07/30/2008  . PROSTATE CANCER, HX OF 04/05/2007  . RESTLESS LEG SYNDROME 05/15/2007  . RHINITIS, ALLERGIC NOS 05/15/2007  . SHOULDER PAIN, LEFT 01/22/2008  . SINUSITIS- ACUTE-NOS 05/15/2007  . Unspecified essential hypertension 04/05/2007  . Unspecified visual loss 07/15/2008  . UTI 10/24/2010  . Impaired glucose tolerance 02/09/2011  . Coronary artery disease      95% followed by 80% mid LAD stenosis, circumflex 80% stenosis. There was a moderate size branching obtuse marginal with 90-95% stenosis in the inferior branch. The right coronary artery had proximal 30% stenosis. Posterior lateral was moderate-sized with 40% stenosis. The EF was 65-70%. He had a DES and  x 2 to the LAD and DES x 2 to the circumflex 03/08/12.    [name] GERD (gastroesophageal reflux disease)   . Emphysema 04/01/2012    By CT chest, august 2013   Past Surgical History  Procedure Laterality Date  . Inguinal herniorrhapy left  2002    x 2  . Rotator cuff repair      left  . Knee surgery      left  . S/p ventral surgery  2009  . Prostatectomy  2002    radical  . Right hip replacement  09/22/09  . Left hip replacement    . Eye surgery      right    reports that he quit smoking about 36 years ago. His smoking use included Cigarettes. He has a 15 pack-year smoking history. He has never used smokeless tobacco. He reports that he drinks about 0.5 ounces  of alcohol per week. He reports that he does not use illicit drugs. family history includes Heart attack (age of onset: 67) in his father; Heart attack (age of onset: 24) in his brother; Lung cancer in his sister. There is no history of Colon cancer. Allergies  Allergen Reactions  . Amitriptyline Hcl     REACTION: confusion  . Diphenhydramine Hcl Other (See Comments)    Keeps him awake  . Simvastatin     REACTION: leg pain   Current Outpatient Prescriptions on File Prior to Visit  Medication Sig Dispense Refill  . amLODipine (NORVASC) 10 MG tablet TAKE 1 TABLET BY  MOUTH EVERY DAY  30 tablet  5  . aspirin 81 MG EC tablet Take 81 mg by mouth daily.        . cloNIDine (CATAPRES) 0.2 MG tablet TAKE 1 TABLET (0.2 MG TOTAL) BY MOUTH 2 (TWO) TIMES DAILY.  60 tablet  0  . clopidogrel (PLAVIX) 75 MG tablet Take 75 mg by mouth daily.      . famotidine (PEPCID) 20 MG tablet TAKE 1 TABLET TWICE A DAY (PER MD NOTES)  60 tablet  8  . ibuprofen (ADVIL,MOTRIN) 200 MG tablet Take 400 mg by mouth every 6 (six) hours as needed. pain      . isosorbide mononitrate (IMDUR) 30 MG 24 hr tablet TAKE 1 TABLET (30 MG TOTAL) BY MOUTH DAILY.  30 tablet  3  . loratadine (CLARITIN) 10 MG tablet Take 10 mg by mouth daily.      [name] lovastatin (MEVACOR) 40 MG tablet TAKE 1 TABLET (40 MG TOTAL) BY MOUTH AT BEDTIME.  30 tablet  6  . Menthol-Methyl Salicylate (MUSCLE RUB) 10-15 % CREA Apply 1 application topically as needed. To arm for pain      . pramipexole (MIRAPEX) 1 MG tablet Take 1 tablet (1 mg total) by mouth 3 (three) times daily.  90 tablet  11  . nitroGLYCERIN (NITROSTAT) 0.4 MG SL tablet Place 1 tablet (0.4 mg total) under the tongue every 5 (five) minutes as needed for chest pain.  25 tablet  3  . zolpidem (AMBIEN) 5 MG tablet Take 1 tablet (5 mg total) by mouth at bedtime as needed for sleep.  30 tablet  3   No current facility-administered medications on file prior to visit.      Review of Systems Constitutional: Negative for diaphoresis, activity change, appetite change or unexpected weight change.  HENT: Negative for hearing loss, ear pain, facial swelling, mouth sores and neck stiffness.   Eyes: Negative for pain, redness and visual disturbance.  Respiratory: Negative for shortness of breath and wheezing.   Cardiovascular: Negative for chest pain and palpitations.  Gastrointestinal: Negative for diarrhea, blood in stool, abdominal distention or other pain Genitourinary: Negative for hematuria, flank pain or change in urine volume.  Musculoskeletal: Negative for myalgias  and joint swelling.  Skin: Negative for color change and wound.  Neurological: Negative for syncope and numbness. other than noted Hematological: Negative for adenopathy.  Psychiatric/Behavioral: Negative for hallucinations, self-injury, decreased concentration and agitation.      Objective:   Physical Exam BP 160/80  Pulse 73  Temp(Src) 98 F (36.7 C) (Oral)  Ht 5\' 8"  (1.727 m)  Wt 162 lb 4 oz (73.596 kg)  BMI 24.68 kg/m2  SpO2 98% VS noted,  Constitutional: Pt is oriented to person, place, and time. Appears well-developed and well-nourished.  Head: Normocephalic and atraumatic.  Right Ear: External ear normal.  Left  Ear: External ear normal.  Nose: Nose normal.  Mouth/Throat: Oropharynx is clear and moist.  Eyes: Conjunctivae and EOM are normal. Pupils are equal, round, and reactive to light.  Neck: Normal range of motion. Neck supple. No JVD present. No tracheal deviation present.  Cardiovascular: Normal rate, regular rhythm, normal heart sounds and intact distal pulses.   Pulmonary/Chest: Effort normal and breath sounds normal.  Abdominal: Soft. Bowel sounds are normal. There is no tenderness. No HSM  Musculoskeletal: Normal range of motion. Exhibits no edema.  Lymphadenopathy:  Has no cervical adenopathy.  Neurological: Pt is alert and oriented to person, place, and time. Pt has normal reflexes. No cranial nerve deficit.  Skin: Skin is warm and dry. No rash noted.  Psychiatric:  Has mild nervous mood and affect. Behavior is normal.     Assessment & Plan:

## 2013-10-15 NOTE — Patient Instructions (Addendum)
OK to continue the citalopram at 20 mg per day (call for refills when you run out) You had the new Prevnar pneumonia shot today Please continue all other medications as before, and refills have been done if requested. Please have the pharmacy call with any other refills you may need. Please continue your efforts at being more active, low cholesterol diet, and weight control. You are otherwise up to date with prevention measures today.  We can hold off on lab work today  .Please keep your appointments with your specialists as you have planned  Please remember to sign up for MyChart if you have not done so, as this will be important to you in the future with finding out test results, communicating by private email, and scheduling acute appointments online when needed.  Please return in 6 months, or sooner if needed

## 2013-10-15 NOTE — Assessment & Plan Note (Signed)

## 2013-10-23 ENCOUNTER — Ambulatory Visit (INDEPENDENT_AMBULATORY_CARE_PROVIDER_SITE_OTHER): Payer: Self-pay | Admitting: *Deleted

## 2013-10-23 DIAGNOSIS — I639 Cerebral infarction, unspecified: Secondary | ICD-10-CM

## 2013-10-23 DIAGNOSIS — I6350 Cerebral infarction due to unspecified occlusion or stenosis of unspecified cerebral artery: Secondary | ICD-10-CM

## 2013-10-23 NOTE — Progress Notes (Signed)
Participant in the office today for his EXIT Visit for the IRIS Study. MMSE was completed.  Blood was drawn and shipped. Participant was off study drug at this time.  Participant to continue with follow-up visits with PCP.  Participant was told to have a FBS drawn in 2 months by his PCP.  Participant will be notified of study trial results in late 2015.

## 2013-10-28 ENCOUNTER — Other Ambulatory Visit (HOSPITAL_COMMUNITY): Payer: Self-pay | Admitting: *Deleted

## 2013-10-28 DIAGNOSIS — I6529 Occlusion and stenosis of unspecified carotid artery: Secondary | ICD-10-CM

## 2013-10-31 ENCOUNTER — Other Ambulatory Visit: Payer: Self-pay

## 2013-10-31 MED ORDER — CLONIDINE HCL 0.2 MG PO TABS: ORAL_TABLET | ORAL | Status: DC

## 2013-11-07 ENCOUNTER — Ambulatory Visit (HOSPITAL_COMMUNITY): Payer: Medicare Other | Attending: Cardiovascular Disease | Admitting: *Deleted

## 2013-11-07 DIAGNOSIS — I6529 Occlusion and stenosis of unspecified carotid artery: Secondary | ICD-10-CM | POA: Insufficient documentation

## 2013-11-07 NOTE — Progress Notes (Signed)
Carotid duplex complete 

## 2013-11-19 ENCOUNTER — Other Ambulatory Visit: Payer: Self-pay | Admitting: Cardiovascular Disease

## 2013-11-21 ENCOUNTER — Other Ambulatory Visit: Payer: Self-pay | Admitting: Cardiovascular Disease

## 2013-11-21 ENCOUNTER — Other Ambulatory Visit: Payer: Self-pay | Admitting: Internal Medicine

## 2013-12-12 ENCOUNTER — Encounter: Payer: Self-pay | Admitting: Cardiovascular Disease

## 2013-12-12 ENCOUNTER — Ambulatory Visit (INDEPENDENT_AMBULATORY_CARE_PROVIDER_SITE_OTHER): Payer: Medicare Other | Admitting: Cardiovascular Disease

## 2013-12-12 VITALS — BP 115/54 | HR 57 | Ht 68.0 in | Wt 157.0 lb

## 2013-12-12 DIAGNOSIS — I779 Disorder of arteries and arterioles, unspecified: Secondary | ICD-10-CM

## 2013-12-12 DIAGNOSIS — I739 Peripheral vascular disease, unspecified: Secondary | ICD-10-CM

## 2013-12-12 DIAGNOSIS — I2510 Atherosclerotic heart disease of native coronary artery without angina pectoris: Secondary | ICD-10-CM

## 2013-12-12 DIAGNOSIS — I10 Essential (primary) hypertension: Secondary | ICD-10-CM

## 2013-12-12 MED ORDER — CLONIDINE HCL 0.1 MG PO TABS: ORAL_TABLET | ORAL | Status: DC

## 2013-12-12 NOTE — Progress Notes (Signed)
History of Present Illness: 73 yo male with history of CAD, CVA, HTN, HLD, PAD, prostate CA who is here today for cardiac follow up. He was seen as a new patient 02/06/12 for evaluation of abnormal chest CT. His chest CT showed calcification of the aorta and coronary arteries. He described occasional chest pains that occurred at rest and with exertion. LHC 02/12/12 showed severe LAD and Circumflex disease. PCI delayed because of renal insufficiency and while he was having a renal cyst workup. He was brought back on 03/08/12 and placed 2 DES in the LAD and 2 DES in the Circumflex. He did well following the PCI.   He is here today for follow up. No exertional chest pains. His breathing is ok. He thinks the clonidine is making him sleepy.   Primary Care Physician: Cathlean Cower  Last Lipid Profile:Lipid Panel     Component Value Date/Time   CHOL 96 12/27/2012 0730   TRIG 66.0 12/27/2012 0730   HDL 27.90* 12/27/2012 0730   CHOLHDL 3 12/27/2012 0730   VLDL 13.2 12/27/2012 0730   LDLCALC 55 12/27/2012 0730     Past Medical History  Diagnosis Date  . BRADYCARDIA 10/24/2010  . CALF PAIN, RIGHT 10/22/2009  . 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 Dysuria 10/10/2010  . ERECTILE DYSFUNCTION 04/05/2007  . FATIGUE 10/02/2007  . GLUCOSE INTOLERANCE 07/15/2008  . HYPERLIPIDEMIA 07/30/2008  . HYPERSOMNIA 10/02/2007  . INSOMNIA-SLEEP DISORDER-UNSPEC 10/22/2009  . LOW BACK PAIN 04/05/2007  . OSTEOARTHRITIS 04/05/2007  . PEPTIC ULCER DISEASE 04/05/2007  . PERIPHERAL EDEMA 10/22/2009  . PERIPHERAL VASCULAR DISEASE 07/30/2008  . PROSTATE CANCER, HX OF 04/05/2007  . RESTLESS LEG SYNDROME 05/15/2007  . RHINITIS, ALLERGIC NOS 05/15/2007  . SHOULDER PAIN, LEFT 01/22/2008  . SINUSITIS- ACUTE-NOS 05/15/2007  . Unspecified essential hypertension 04/05/2007  . Unspecified visual loss 07/15/2008  . UTI 10/24/2010  . Impaired glucose tolerance 02/09/2011  . Coronary artery disease      95% followed by 80% mid LAD stenosis,  circumflex 80% stenosis. There was a moderate size branching obtuse marginal with 90-95% stenosis in the inferior branch. The right coronary artery had proximal 30% stenosis. Posterior lateral was moderate-sized with 40% stenosis. The EF was 65-70%. He had a DES and  x 2 to the LAD and DES x 2 to the circumflex 03/08/12.    [name] GERD (gastroesophageal reflux disease)   . Emphysema 04/01/2012    By CT chest, august 2013    Past Surgical History  Procedure Laterality Date  . Inguinal herniorrhapy left  2002    x 2  . Rotator cuff repair      left  . Knee surgery      left  . S/p ventral surgery  2009  . Prostatectomy  2002    radical  . Right hip replacement  09/22/09  . Left hip replacement    . Eye surgery      right    Current Outpatient Prescriptions  Medication Sig Dispense Refill  . amLODipine (NORVASC) 10 MG tablet TAKE 1 TABLET BY MOUTH EVERY DAY  30 tablet  11  . aspirin 81 MG EC tablet Take 81 mg by mouth daily.        . citalopram (CELEXA) 20 MG tablet Take 1 tablet (20 mg total) by mouth daily.  90 tablet  3  . cloNIDine (CATAPRES) 0.2 MG tablet TAKE 1 TABLET (0.2 MG TOTAL) BY MOUTH 2 (TWO) TIMES DAILY.  60 tablet  1  .  clopidogrel (PLAVIX) 75 MG tablet TAKE 1 TABLET BY MOUTH EVERY DAY  30 tablet  2  . famotidine (PEPCID) 20 MG tablet TAKE 1 TABLET TWICE A DAY (PER MD NOTES)  60 tablet  8  . ibuprofen (ADVIL,MOTRIN) 200 MG tablet Take 400 mg by mouth every 6 (six) hours as needed. pain      . isosorbide mononitrate (IMDUR) 30 MG 24 hr tablet TAKE 1 TABLET (30 MG TOTAL) BY MOUTH DAILY.  30 tablet  3  . loratadine (CLARITIN) 10 MG tablet Take 10 mg by mouth daily.      [name] lovastatin (MEVACOR) 40 MG tablet TAKE 1 TABLET (40 MG TOTAL) BY MOUTH AT BEDTIME.  30 tablet  6  . Menthol-Methyl Salicylate (MUSCLE RUB) 10-15 % CREA Apply 1 application topically as needed. To arm for pain      . pramipexole (MIRAPEX) 1 MG tablet Take 1 tablet (1 mg total) by mouth 3 (three) times daily.  90  tablet  11  . nitroGLYCERIN (NITROSTAT) 0.4 MG SL tablet Place 1 tablet (0.4 mg total) under the tongue every 5 (five) minutes as needed for chest pain.  25 tablet  3  . zolpidem (AMBIEN) 5 MG tablet Take 1 tablet (5 mg total) by mouth at bedtime as needed for sleep.  30 tablet  3   No current facility-administered medications for this visit.    Allergies  Allergen Reactions  . Amitriptyline Hcl     REACTION: confusion  . Diphenhydramine Hcl Other (See Comments)    Keeps him awake  . Simvastatin     REACTION: leg pain    History   Social History  . Marital Status: Married    Spouse Name: N/A    Number of Children: 2  . Years of Education: N/A   Occupational History  . truck driver-retired    Social History Main Topics  . Smoking status: Former Smoker -- 1.00 packs/day for 15 years    Types: Cigarettes    Quit date: 02/05/1977  . Smokeless tobacco: Never Used  . Alcohol Use: 0.5 oz/week    1 drink(s) per week     Comment: RARE  . Drug Use: No  . Sexual Activity: Not Currently   Other Topics Concern  . Not on file   Social History Narrative  . No narrative on file    Family History  Problem Relation Age of Onset  . Heart attack Father 39    Smoker  . Heart attack Brother 54  . Colon cancer Neg Hx   . Lung cancer Sister     Review of Systems:  As stated in the HPI and otherwise negative.   BP 115/54  Pulse 57  Ht 5\' 8"  (1.727 m)  Wt 157 lb (71.215 kg)  BMI 23.88 kg/m2  Physical Examination: General: Well developed, well nourished, NAD HEENT: OP clear, mucus membranes moist SKIN: warm, dry. No rashes. Neuro: No focal deficits Musculoskeletal: Muscle strength 5/5 all ext Psychiatric: Mood and affect normal Neck: No JVD, no carotid bruits, no thyromegaly, no lymphadenopathy. Lungs:Clear bilaterally, no wheezes, rhonci, crackles Cardiovascular: Regular rate and rhythm. No murmurs, gallops or rubs. Abdomen:Soft. Bowel sounds present. Non-tender.    Extremities: No lower extremity edema. Pulses are 2 + in the bilateral DP/PT.  Assessment and Plan:   1. CAD: Stable. Continue ASA and Plavix. No beta blocker with braydcardia.   2. HTN: BP is well controlled. He thinks the clonidine is making him sleepy. Will  reduce to clonidine 0.1 mg in am and 0.2 mg in pm. Continue other meds.   3. HLD: Continue statin. Lipids well controlled.   4. Carotid artery disease: Bilateral 40-50% stenosis. Repeat one year.

## 2013-12-12 NOTE — Patient Instructions (Signed)
Your physician wants you to follow-up in: 6 months.  You will receive a reminder letter in the mail two months in advance. If you don't receive a letter, please call our office to schedule the follow-up appointment.  Your physician has recommended you make the following change in your medication:  Change clonidine to 0.1 mg by mouth every morning and 0.2 mg by mouth every evening

## 2013-12-18 ENCOUNTER — Other Ambulatory Visit: Payer: Self-pay | Admitting: Cardiovascular Disease

## 2014-01-01 ENCOUNTER — Other Ambulatory Visit: Payer: Self-pay | Admitting: Internal Medicine

## 2014-01-27 ENCOUNTER — Other Ambulatory Visit: Payer: Self-pay | Admitting: Cardiovascular Disease

## 2014-02-03 ENCOUNTER — Telehealth: Payer: Self-pay | Admitting: Internal Medicine

## 2014-02-03 MED ORDER — CITALOPRAM HYDROBROMIDE 40 MG PO TABS: 40.0000 mg | ORAL_TABLET | Freq: Every day | ORAL | Status: DC

## 2014-02-03 NOTE — Telephone Encounter (Signed)
Done erx 

## 2014-02-03 NOTE — Telephone Encounter (Signed)
Patient was taking a script for citalopram hbr 40 mg which was prescribed to Victor Davis.  [name] states Dr. Jenny Davis told him to contact once the script ran out and he could get a script for himself.  Please advise.

## 2014-03-25 ENCOUNTER — Other Ambulatory Visit: Payer: Self-pay | Admitting: Internal Medicine

## 2014-04-21 ENCOUNTER — Encounter: Payer: Self-pay | Admitting: Internal Medicine

## 2014-04-21 ENCOUNTER — Ambulatory Visit (INDEPENDENT_AMBULATORY_CARE_PROVIDER_SITE_OTHER): Payer: Medicare Other | Admitting: Internal Medicine

## 2014-04-21 ENCOUNTER — Other Ambulatory Visit (INDEPENDENT_AMBULATORY_CARE_PROVIDER_SITE_OTHER): Payer: Medicare Other

## 2014-04-21 VITALS — BP 162/92 | HR 62 | Temp 98.1°F | Wt 153.2 lb

## 2014-04-21 DIAGNOSIS — Z8546 Personal history of malignant neoplasm of prostate: Secondary | ICD-10-CM

## 2014-04-21 DIAGNOSIS — R7302 Impaired glucose tolerance (oral): Secondary | ICD-10-CM

## 2014-04-21 DIAGNOSIS — Z23 Encounter for immunization: Secondary | ICD-10-CM

## 2014-04-21 DIAGNOSIS — R7309 Other abnormal glucose: Secondary | ICD-10-CM

## 2014-04-21 DIAGNOSIS — E785 Hyperlipidemia, unspecified: Secondary | ICD-10-CM

## 2014-04-21 DIAGNOSIS — I10 Essential (primary) hypertension: Secondary | ICD-10-CM

## 2014-04-21 LAB — HEPATIC FUNCTION PANEL
ALK PHOS: 73 U/L (ref 39–117)
ALT: 19 U/L (ref 0–53)
AST: 24 U/L (ref 0–37)
Albumin: 4.2 g/dL (ref 3.5–5.2)
Bilirubin, Direct: 0.2 mg/dL (ref 0.0–0.3)
Total Bilirubin: 1.4 mg/dL — ABNORMAL HIGH (ref 0.2–1.2)
Total Protein: 7.1 g/dL (ref 6.0–8.3)

## 2014-04-21 LAB — TSH: TSH: 2.71 u[IU]/mL (ref 0.35–4.50)

## 2014-04-21 LAB — BASIC METABOLIC PANEL
BUN: 18 mg/dL (ref 6–23)
CO2: 28 meq/L (ref 19–32)
CREATININE: 1.3 mg/dL (ref 0.4–1.5)
Calcium: 9.7 mg/dL (ref 8.4–10.5)
Chloride: 105 mEq/L (ref 96–112)
GFR: 56.26 mL/min — ABNORMAL LOW (ref >60.00)
Glucose, Bld: 89 mg/dL (ref 70–99)
Potassium: 4.1 mEq/L (ref 3.5–5.1)
SODIUM: 139 meq/L (ref 135–145)

## 2014-04-21 LAB — LIPID PANEL
Cholesterol: 158 mg/dL (ref 0–200)
HDL: 40.3 mg/dL (ref >39.00)
LDL Cholesterol: 94 mg/dL (ref 0–99)
NonHDL: 117.7
Total CHOL/HDL Ratio: 4
Triglycerides: 119 mg/dL (ref 0.0–149.0)
VLDL: 23.8 mg/dL (ref 0.0–40.0)

## 2014-04-21 LAB — URINALYSIS, ROUTINE W REFLEX MICROSCOPIC
Bilirubin Urine: NEGATIVE
HGB URINE DIPSTICK: NEGATIVE
Ketones, ur: NEGATIVE
LEUKOCYTES UA: NEGATIVE
NITRITE: NEGATIVE
Specific Gravity, Urine: 1.02 (ref 1.000–1.030)
TOTAL PROTEIN, URINE-UPE24: 100 — AB
Urine Glucose: NEGATIVE
Urobilinogen, UA: 0.2 (ref 0.0–1.0)
pH: 6 (ref 5.0–8.0)

## 2014-04-21 LAB — CBC WITH DIFFERENTIAL/PLATELET
Basophils Absolute: 0 10*3/uL (ref 0.0–0.1)
Basophils Relative: 0.3 % (ref 0.0–3.0)
EOS ABS: 0.2 10*3/uL (ref 0.0–0.7)
Eosinophils Relative: 1.8 % (ref 0.0–5.0)
HEMATOCRIT: 41 % (ref 39.0–52.0)
Hemoglobin: 13.7 g/dL (ref 13.0–17.0)
LYMPHS ABS: 2.1 10*3/uL (ref 0.7–4.0)
Lymphocytes Relative: 22.8 % (ref 12.0–46.0)
MCHC: 33.5 g/dL (ref 30.0–36.0)
MCV: 89.1 fl (ref 78.0–100.0)
Monocytes Absolute: 0.7 10*3/uL (ref 0.1–1.0)
Monocytes Relative: 7.4 % (ref 3.0–12.0)
NEUTROS PCT: 67.7 % (ref 43.0–77.0)
Neutro Abs: 6.3 10*3/uL (ref 1.4–7.7)
Platelets: 175 10*3/uL (ref 150.0–400.0)
RBC: 4.6 Mil/uL (ref 4.22–5.81)
RDW: 12.9 % (ref 11.5–15.5)
WBC: 9.4 10*3/uL (ref 4.0–10.5)

## 2014-04-21 LAB — PSA: PSA: 0.53 ng/mL (ref 0.10–4.00)

## 2014-04-21 LAB — HEMOGLOBIN A1C: Hgb A1c MFr Bld: 5.8 % (ref 4.6–6.5)

## 2014-04-21 MED ORDER — CLONIDINE HCL 0.1 MG PO TABS: ORAL_TABLET | ORAL | Status: DC

## 2014-04-21 NOTE — Progress Notes (Signed)
Subjective:    Patient ID: Victor Davis, male    DOB: 1941/07/03, 73 y.o.   MRN: [id number]  HPI  Here to f/u; overall doing ok,  Pt denies chest pain, increased sob or doe, wheezing, orthopnea, PND, increased LE swelling, palpitations, dizziness or syncope.  Pt denies polydipsia, polyuria, or low sugar symptoms such as weakness or confusion improved with po intake.  Pt denies new neurological symptoms such as new headache, or facial or extremity weakness or numbness.   Pt states overall good compliance with meds, has been trying to follow lower cholesterol diet, with wt overall stable,  but little exercise however. In a rush to get here, forgot to take BP meds, usuaully Overall good compliance with treatment, and good medicine tolerability Did not have labs last visit.  Due for f/u psa, per pt not seen urology in over 6 mo Past Medical History  Diagnosis Date  . BRADYCARDIA 10/24/2010  . CALF PAIN, RIGHT 10/22/2009  . 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 Dysuria 10/10/2010  . ERECTILE DYSFUNCTION 04/05/2007  . FATIGUE 10/02/2007  . GLUCOSE INTOLERANCE 07/15/2008  . HYPERLIPIDEMIA 07/30/2008  . HYPERSOMNIA 10/02/2007  . INSOMNIA-SLEEP DISORDER-UNSPEC 10/22/2009  . LOW BACK PAIN 04/05/2007  . OSTEOARTHRITIS 04/05/2007  . PEPTIC ULCER DISEASE 04/05/2007  . PERIPHERAL EDEMA 10/22/2009  . PERIPHERAL VASCULAR DISEASE 07/30/2008  . PROSTATE CANCER, HX OF 04/05/2007  . RESTLESS LEG SYNDROME 05/15/2007  . RHINITIS, ALLERGIC NOS 05/15/2007  . SHOULDER PAIN, LEFT 01/22/2008  . SINUSITIS- ACUTE-NOS 05/15/2007  . Unspecified essential hypertension 04/05/2007  . Unspecified visual loss 07/15/2008  . UTI 10/24/2010  . Impaired glucose tolerance 02/09/2011  . Coronary artery disease      95% followed by 80% mid LAD stenosis, circumflex 80% stenosis. There was a moderate size branching obtuse marginal with 90-95% stenosis in the inferior branch. The right coronary artery had proximal 30% stenosis. Posterior lateral  was moderate-sized with 40% stenosis. The EF was 65-70%. He had a DES and  x 2 to the LAD and DES x 2 to the circumflex 03/08/12.    [name] GERD (gastroesophageal reflux disease)   . Emphysema 04/01/2012    By CT chest, august 2013   Past Surgical History  Procedure Laterality Date  . Inguinal herniorrhapy left  2002    x 2  . Rotator cuff repair      left  . Knee surgery      left  . S/p ventral surgery  2009  . Prostatectomy  2002    radical  . Right hip replacement  09/22/09  . Left hip replacement    . Eye surgery      right    reports that he quit smoking about 37 years ago. His smoking use included Cigarettes. He has a 15 pack-year smoking history. He has never used smokeless tobacco. He reports that he drinks about .5 ounces of alcohol per week. He reports that he does not use illicit drugs. family history includes Heart attack (age of onset: 34) in his father; Heart attack (age of onset: 50) in his brother; Lung cancer in his sister. There is no history of Colon cancer. Allergies  Allergen Reactions  . Amitriptyline Hcl     REACTION: confusion  . Diphenhydramine Hcl Other (See Comments)    Keeps him awake  . Simvastatin     REACTION: leg pain   Current Outpatient Prescriptions on File Prior to Visit  Medication Sig Dispense Refill  . amLODipine (NORVASC)  10 MG tablet TAKE 1 TABLET BY MOUTH EVERY DAY  30 tablet  11  . aspirin 81 MG EC tablet Take 81 mg by mouth daily.        . citalopram (CELEXA) 40 MG tablet Take 1 tablet (40 mg total) by mouth daily.  90 tablet  3  . clopidogrel (PLAVIX) 75 MG tablet TAKE 1 TABLET BY MOUTH EVERY DAY  30 tablet  2  . famotidine (PEPCID) 20 MG tablet TAKE 1 TABLET TWICE A DAY (PER MD NOTES)  60 tablet  8  . ibuprofen (ADVIL,MOTRIN) 200 MG tablet Take 400 mg by mouth every 6 (six) hours as needed. pain      . isosorbide mononitrate (IMDUR) 30 MG 24 hr tablet TAKE 1 TABLET (30 MG TOTAL) BY MOUTH DAILY.  30 tablet  3  . loratadine (CLARITIN) 10 MG  tablet Take 10 mg by mouth daily.      [name] lovastatin (MEVACOR) 40 MG tablet TAKE 1 TABLET (40 MG TOTAL) BY MOUTH AT BEDTIME.  30 tablet  5  . Menthol-Methyl Salicylate (MUSCLE RUB) 10-15 % CREA Apply 1 application topically as needed. To arm for pain      . pramipexole (MIRAPEX) 1 MG tablet TAKE 1 TABLET (1 MG TOTAL) BY MOUTH 3 (THREE) TIMES DAILY.  90 tablet  0  . nitroGLYCERIN (NITROSTAT) 0.4 MG SL tablet Place 1 tablet (0.4 mg total) under the tongue every 5 (five) minutes as needed for chest pain.  25 tablet  3  . zolpidem (AMBIEN) 5 MG tablet Take 1 tablet (5 mg total) by mouth at bedtime as needed for sleep.  30 tablet  3   No current facility-administered medications on file prior to visit.    Review of Systems  Constitutional: Negative for unusual diaphoresis or other sweats  HENT: Negative for ringing in ear Eyes: Negative for double vision or worsening visual disturbance.  Respiratory: Negative for choking and stridor.   Gastrointestinal: Negative for vomiting or other signifcant bowel change Genitourinary: Negative for hematuria or decreased urine volume.  Musculoskeletal: Negative for other MSK pain or swelling Skin: Negative for color change and worsening wound.  Neurological: Negative for tremors and numbness other than noted  Psychiatric/Behavioral: Negative for decreased concentration or agitation other than above       Objective:   Physical Exam BP 162/92  Pulse 62  Temp(Src) 98.1 F (36.7 C) (Oral)  Wt 153 lb 4 oz (69.514 kg)  SpO2 96% VS noted,  Constitutional: Pt appears well-developed, well-nourished.  HENT: Head: NCAT.  Right Ear: External ear normal.  Left Ear: External ear normal.  Eyes: . Pupils are equal, round, and reactive to light. Conjunctivae and EOM are normal Neck: Normal range of motion. Neck supple.  Cardiovascular: Normal rate and regular rhythm.   Pulmonary/Chest: Effort normal and breath sounds normal.  Abd:  Soft, NT, ND, +  BS Neurological: Pt is alert. Not confused , motor grossly intact Skin: Skin is warm. No rash Psychiatric: Pt behavior is normal. No agitation.     Assessment & Plan:

## 2014-04-21 NOTE — Progress Notes (Signed)
Pre visit review using our clinic review tool, if applicable. No additional management support is needed unless otherwise documented below in the visit note. 

## 2014-04-21 NOTE — Patient Instructions (Signed)
Please continue all other medications as before, and refills have been done if requested.  Please have the pharmacy call with any other refills you may need.  Please continue your efforts at being more active, low cholesterol diet, and weight control.  You are otherwise up to date with prevention measures today.  Please keep your appointments with your specialists as you may have planned  Please go to the LAB in the Basement (turn left off the elevator) for the tests to be done today  You will be contacted by phone if any changes need to be made immediately.  Otherwise, you will receive a letter about your results with an explanation, but please check with MyChart first.  Please return in 6 months, or sooner if needed

## 2014-04-26 NOTE — Assessment & Plan Note (Signed)
Also for psa as he is due 

## 2014-04-26 NOTE — Assessment & Plan Note (Signed)
Mild elevated, did not take meds this am, o/w stable overall by history and exam, recent data reviewed with pt, and pt to continue medical treatment as before,  to f/u any worsening symptoms or concerns BP Readings from Last 3 Encounters:  04/21/14 162/92  12/12/13 115/54  10/15/13 160/80

## 2014-04-26 NOTE — Assessment & Plan Note (Signed)
stable overall by history and exam, recent data reviewed with pt, and pt to continue medical treatment as before,  to f/u any worsening symptoms or concerns Lab Results  Component Value Date   LDLCALC 94 04/21/2014

## 2014-04-26 NOTE — Assessment & Plan Note (Signed)
stable overall by history and exam, recent data reviewed with pt, and pt to continue medical treatment as before,  to f/u any worsening symptoms or concerns Lab Results  Component Value Date   HGBA1C 5.8 04/21/2014

## 2014-05-02 ENCOUNTER — Other Ambulatory Visit: Payer: Self-pay | Admitting: Cardiovascular Disease

## 2014-05-14 ENCOUNTER — Emergency Department (INDEPENDENT_AMBULATORY_CARE_PROVIDER_SITE_OTHER): Payer: Medicare Other

## 2014-05-14 ENCOUNTER — Emergency Department (INDEPENDENT_AMBULATORY_CARE_PROVIDER_SITE_OTHER)
Admission: EM | Admit: 2014-05-14 | Discharge: 2014-05-14 | Disposition: A | Discharge status: Home / Self Care | Payer: Medicare Other | Source: Home / Self Care | Attending: Emergency Medicine | Admitting: Emergency Medicine

## 2014-05-14 ENCOUNTER — Encounter (HOSPITAL_COMMUNITY): Payer: Self-pay | Admitting: Emergency Medicine

## 2014-05-14 DIAGNOSIS — S60222A Contusion of left hand, initial encounter: Secondary | ICD-10-CM

## 2014-05-14 NOTE — Discharge Instructions (Signed)
Hand Contusion A hand contusion is a deep bruise on your hand area. Contusions are the result of an injury that caused bleeding under the skin. The contusion may turn blue, purple, or yellow. Minor injuries will give you a painless contusion, but more severe contusions may stay painful and swollen for a few weeks. CAUSES  A contusion is usually caused by a blow, trauma, or direct force to an area of the body. SYMPTOMS   Swelling and redness of the injured area.  Discoloration of the injured area.  Tenderness and soreness of the injured area.  Pain. DIAGNOSIS  The diagnosis can be made by taking a history and performing a physical exam. An X-ray, CT scan, or MRI may be needed to determine if there were any associated injuries, such as broken bones (fractures). TREATMENT  Often, the best treatment for a hand contusion is resting, elevating, icing, and applying cold compresses to the injured area. Over-the-counter medicines may also be recommended for pain control. HOME CARE INSTRUCTIONS   Put ice on the injured area.  Put ice in a plastic bag.  Place a towel between your skin and the bag.  Leave the ice on for 15-20 minutes, 03-04 times a day.  Only take over-the-counter or prescription medicines as directed by your caregiver. Your caregiver may recommend avoiding anti-inflammatory medicines (aspirin, ibuprofen, and naproxen) for 48 hours because these medicines may increase bruising.  If told, use an elastic wrap as directed. This can help reduce swelling. You may remove the wrap for sleeping, showering, and bathing. If your fingers become numb, cold, or blue, take the wrap off and reapply it more loosely.  Elevate your hand with pillows to reduce swelling.  Avoid overusing your hand if it is painful. SEEK IMMEDIATE MEDICAL CARE IF:   You have increased redness, swelling, or pain in your hand.  Your swelling or pain is not relieved with medicines.  You have loss of feeling in  your hand or are unable to move your fingers.  Your hand turns cold or blue.  You have pain when you move your fingers.  Your hand becomes warm to the touch.  Your contusion does not improve in 2 days. MAKE SURE YOU:   Understand these instructions.  Will watch your condition.  Will get help right away if you are not doing well or get worse. Document Released: 01/20/2002 Document Revised: 04/24/2012 Document Reviewed: 01/22/2012 [facility] Patient Information 2015 [place], [place]. This information is not intended to replace advice given to you by your health care provider. Make sure you discuss any questions you have with your health care provider.

## 2014-05-14 NOTE — ED Notes (Signed)
Pt  Reports    He  Felled  Yesterday  And  Injured  His  l  Hand  - he  Has  Pain  And  Swelling  To  The  Affected  Hand            He  Is  Awake  And  Alert and  Oriented   Skin  is  Warm and  Dry

## 2014-05-14 NOTE — ED Provider Notes (Signed)
Medical screening examination/treatment/procedure(s) were performed by non-physician practitioner and as supervising physician I was immediately available for consultation/collaboration.  Philipp Deputy, M.D.  Harden Mo, MD 05/14/14 2221

## 2014-05-14 NOTE — ED Provider Notes (Signed)
CSN: [id number]     Arrival date & time 05/14/14  1335 History   First MD Initiated Contact with Patient 05/14/14 1402     Chief Complaint  Patient presents with  . Hand Injury   (Consider location/radiation/quality/duration/timing/severity/associated sxs/prior Treatment) HPI     73 year old male presents complaining of left hand pain and swelling. He fell in his kitchen last night and caught himself on his hand. Since then he has had a swelling and pain across his knuckles and over his metacarpals. Pain is increased with gripping and is tender to touch. No numbness or weakness in the hand. No history of osteoporosis.  Past Medical History  Diagnosis Date  . BRADYCARDIA 10/24/2010  . CALF PAIN, RIGHT 10/22/2009  . 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 Dysuria 10/10/2010  . ERECTILE DYSFUNCTION 04/05/2007  . FATIGUE 10/02/2007  . GLUCOSE INTOLERANCE 07/15/2008  . HYPERLIPIDEMIA 07/30/2008  . HYPERSOMNIA 10/02/2007  . INSOMNIA-SLEEP DISORDER-UNSPEC 10/22/2009  . LOW BACK PAIN 04/05/2007  . OSTEOARTHRITIS 04/05/2007  . PEPTIC ULCER DISEASE 04/05/2007  . PERIPHERAL EDEMA 10/22/2009  . PERIPHERAL VASCULAR DISEASE 07/30/2008  . PROSTATE CANCER, HX OF 04/05/2007  . RESTLESS LEG SYNDROME 05/15/2007  . RHINITIS, ALLERGIC NOS 05/15/2007  . SHOULDER PAIN, LEFT 01/22/2008  . SINUSITIS- ACUTE-NOS 05/15/2007  . Unspecified essential hypertension 04/05/2007  . Unspecified visual loss 07/15/2008  . UTI 10/24/2010  . Impaired glucose tolerance 02/09/2011  . Coronary artery disease      95% followed by 80% mid LAD stenosis, circumflex 80% stenosis. There was a moderate size branching obtuse marginal with 90-95% stenosis in the inferior branch. The right coronary artery had proximal 30% stenosis. Posterior lateral was moderate-sized with 40% stenosis. The EF was 65-70%. He had a DES and  x 2 to the LAD and DES x 2 to the circumflex 03/08/12.    [name] GERD (gastroesophageal reflux disease)   . Emphysema 04/01/2012    By  CT chest, august 2013   Past Surgical History  Procedure Laterality Date  . Inguinal herniorrhapy left  2002    x 2  . Rotator cuff repair      left  . Knee surgery      left  . S/p ventral surgery  2009  . Prostatectomy  2002    radical  . Right hip replacement  09/22/09  . Left hip replacement    . Eye surgery      right   Family History  Problem Relation Age of Onset  . Heart attack Father 20    Smoker  . Heart attack Brother 70  . Colon cancer Neg Hx   . Lung cancer Sister    History  Substance Use Topics  . Smoking status: Former Smoker -- 1.00 packs/day for 15 years    Types: Cigarettes    Quit date: 02/05/1977  . Smokeless tobacco: Never Used  . Alcohol Use: 0.5 oz/week    1 drink(s) per week     Comment: RARE    Review of Systems  Musculoskeletal:       Left hand pain and swelling, see history of present illness  All other systems reviewed and are negative.   Allergies  Amitriptyline hcl; Diphenhydramine hcl; and Simvastatin  Home Medications   Prior to Admission medications   Medication Sig Start Date End Date Taking? Authorizing Provider  amLODipine (NORVASC) 10 MG tablet TAKE 1 TABLET BY MOUTH EVERY DAY 11/21/13   Biagio Borg, MD  aspirin 81 MG EC tablet  Take 81 mg by mouth daily.      Historical Provider, MD  citalopram (CELEXA) 40 MG tablet Take 1 tablet (40 mg total) by mouth daily. 02/03/14   Biagio Borg, MD  cloNIDine (CATAPRES) 0.1 MG tablet Take 1 tablet by mouth every AM and 2 tablets by mouth every PM 04/21/14   Biagio Borg, MD  clopidogrel (PLAVIX) 75 MG tablet TAKE 1 TABLET BY MOUTH EVERY DAY 11/19/13   Burnell Blanks, MD  famotidine (PEPCID) 20 MG tablet TAKE 1 TABLET TWICE A DAY (PER MD NOTES) 06/05/13   Jerene Bears, MD  ibuprofen (ADVIL,MOTRIN) 200 MG tablet Take 400 mg by mouth every 6 (six) hours as needed. pain    Historical Provider, MD  isosorbide mononitrate (IMDUR) 30 MG 24 hr tablet TAKE 1 TABLET (30 MG TOTAL) BY MOUTH  DAILY. 05/04/14   Burnell Blanks, MD  loratadine (CLARITIN) 10 MG tablet Take 10 mg by mouth daily.    Historical Provider, MD  lovastatin (MEVACOR) 40 MG tablet TAKE 1 TABLET (40 MG TOTAL) BY MOUTH AT BEDTIME.    Burnell Blanks, MD  Menthol-Methyl Salicylate (MUSCLE RUB) 10-15 % CREA Apply 1 application topically as needed. To arm for pain    Historical Provider, MD  nitroGLYCERIN (NITROSTAT) 0.4 MG SL tablet Place 1 tablet (0.4 mg total) under the tongue every 5 (five) minutes as needed for chest pain. 03/09/12 04/25/13  Roger A Arguello, PA-C  pramipexole (MIRAPEX) 1 MG tablet TAKE 1 TABLET (1 MG TOTAL) BY MOUTH 3 (THREE) TIMES DAILY. 03/25/14   Biagio Borg, MD  zolpidem (AMBIEN) 5 MG tablet Take 1 tablet (5 mg total) by mouth at bedtime as needed for sleep. 02/20/12 04/25/13  Biagio Borg, MD   BP 144/74  Pulse 60  Temp(Src) 98.1 F (36.7 C) (Oral)  Resp 18  SpO2 98% Physical Exam  Nursing note and vitals reviewed. Constitutional: He is oriented to person, place, and time. He appears well-developed and well-nourished. No distress.  HENT:  Head: Normocephalic.  Cardiovascular:  Pulses:      Radial pulses are 2+ on the right side, and 2+ on the left side.  Pulmonary/Chest: Effort normal. No respiratory distress.  Musculoskeletal:       Left hand: He exhibits tenderness (Tenderness and swelling across the dorsum of the hand from the mid metacarpals through the metacarpal heads) and swelling. He exhibits normal range of motion and normal capillary refill. Normal sensation noted. Decreased strength (secondary to pain) noted.  No rotation of the fingers with finger flexion  Neurological: He is alert and oriented to person, place, and time. Coordination normal.  Skin: Skin is warm and dry. No rash noted. He is not diaphoretic.  Psychiatric: He has a normal mood and affect. Judgment normal.    ED Course  Procedures (including critical care time) Labs Review Labs Reviewed - No  data to display  Imaging Review Dg Hand Complete Left  05/14/2014   CLINICAL DATA:  73 year old male who fell off front port yesterday onto left hand with pain and swelling. Pain in the third through fifth metacarpals. Initial encounter.  EXAM: LEFT HAND - COMPLETE 3+ VIEW  COMPARISON:  None.  FINDINGS: Soft tissue swelling maximal at the metacarpals. Bone mineralization is within normal limits for age. Distal radius and ulna intact. Carpal bone alignment within normal limits. Joint space loss and subchondral sclerosis at the basal joint of the left thumb. Metacarpals intact. Hoped appearance of  the metacarpal heads noted. MCP joints preserved. Phalanges intact.  IMPRESSION: Soft tissue swelling but no acute fracture or dislocation identified about the left hand.   Electronically Signed   By: Lars Pinks M.D.   On: 05/14/2014 14:15     MDM   1. Contusion, hand, left, initial encounter    No x-ray evidence of fracture. Treat as contusion with ice, elevation. Advised to repeat x-ray in one week if he still is having significant pain. Followup when necessary      Liam Graham, PA-C 05/14/14 1544

## 2014-05-29 ENCOUNTER — Other Ambulatory Visit: Payer: Self-pay

## 2014-06-03 ENCOUNTER — Other Ambulatory Visit: Payer: Self-pay | Admitting: Cardiovascular Disease

## 2014-06-22 ENCOUNTER — Other Ambulatory Visit: Payer: Self-pay | Admitting: Internal Medicine

## 2014-07-01 ENCOUNTER — Other Ambulatory Visit: Payer: Self-pay | Admitting: Cardiovascular Disease

## 2014-07-23 ENCOUNTER — Encounter (HOSPITAL_COMMUNITY): Payer: Self-pay | Admitting: Cardiovascular Disease

## 2014-08-05 ENCOUNTER — Other Ambulatory Visit: Payer: Self-pay | Admitting: Cardiovascular Disease

## 2014-08-20 ENCOUNTER — Encounter: Payer: Self-pay | Admitting: Cardiovascular Disease

## 2014-08-20 ENCOUNTER — Ambulatory Visit (INDEPENDENT_AMBULATORY_CARE_PROVIDER_SITE_OTHER): Payer: Medicare Other | Admitting: Cardiovascular Disease

## 2014-08-20 VITALS — BP 120/60 | HR 62 | Ht 68.0 in | Wt 160.8 lb

## 2014-08-20 DIAGNOSIS — E785 Hyperlipidemia, unspecified: Secondary | ICD-10-CM

## 2014-08-20 DIAGNOSIS — I2510 Atherosclerotic heart disease of native coronary artery without angina pectoris: Secondary | ICD-10-CM

## 2014-08-20 DIAGNOSIS — I10 Essential (primary) hypertension: Secondary | ICD-10-CM | POA: Diagnosis not present

## 2014-08-20 DIAGNOSIS — K219 Gastro-esophageal reflux disease without esophagitis: Secondary | ICD-10-CM

## 2014-08-20 MED ORDER — PANTOPRAZOLE SODIUM 40 MG PO TBEC: 40.0000 mg | DELAYED_RELEASE_TABLET | Freq: Every day | ORAL | Status: DC

## 2014-08-20 MED ORDER — NITROGLYCERIN 0.4 MG SL SUBL: 0.4000 mg | SUBLINGUAL_TABLET | SUBLINGUAL | Status: DC | PRN

## 2014-08-20 NOTE — Patient Instructions (Signed)
Your physician wants you to follow-up in:  6 months.  You will receive a reminder letter in the mail two months in advance. If you don't receive a letter, please call our office to schedule the follow-up appointment.   Your physician has recommended you make the following change in your medication:  Stop Pepcid.  Start Protonix 40 mg by mouth daily.

## 2014-08-20 NOTE — Progress Notes (Signed)
History of Present Illness: 74 yo male with history of CAD, CVA, HTN, HLD, PAD, prostate CA who is here today for cardiac follow up. He was seen as a new patient 02/06/12 for evaluation of abnormal chest CT. His chest CT showed calcification of the aorta and coronary arteries. He described occasional chest pains that occurred at rest and with exertion. LHC 02/12/12 showed severe LAD and Circumflex disease. PCI delayed because of renal insufficiency and while he was having a renal cyst workup. He was brought back on 03/08/12 and placed 2 DES in the LAD and 2 DES in the Circumflex. He did well following the PCI.   He is here today for follow up. No exertional chest pains. His breathing is ok.   Primary Care Physician: Cathlean Cower  Last Lipid Profile:Lipid Panel     Component Value Date/Time   CHOL 158 04/21/2014 1203   TRIG 119.0 04/21/2014 1203   HDL 40.30 04/21/2014 1203   CHOLHDL 4 04/21/2014 1203   VLDL 23.8 04/21/2014 1203   [place] 94 04/21/2014 1203    Past Medical History  Diagnosis Date  . BRADYCARDIA 10/24/2010  . CALF PAIN, RIGHT 10/22/2009  . 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 Dysuria 10/10/2010  . ERECTILE DYSFUNCTION 04/05/2007  . FATIGUE 10/02/2007  . GLUCOSE INTOLERANCE 07/15/2008  . HYPERLIPIDEMIA 07/30/2008  . HYPERSOMNIA 10/02/2007  . INSOMNIA-SLEEP DISORDER-UNSPEC 10/22/2009  . LOW BACK PAIN 04/05/2007  . OSTEOARTHRITIS 04/05/2007  . PEPTIC ULCER DISEASE 04/05/2007  . PERIPHERAL EDEMA 10/22/2009  . PERIPHERAL VASCULAR DISEASE 07/30/2008  . PROSTATE CANCER, HX OF 04/05/2007  . RESTLESS LEG SYNDROME 05/15/2007  . RHINITIS, ALLERGIC NOS 05/15/2007  . SHOULDER PAIN, LEFT 01/22/2008  . SINUSITIS- ACUTE-NOS 05/15/2007  . Unspecified essential hypertension 04/05/2007  . Unspecified visual loss 07/15/2008  . UTI 10/24/2010  . Impaired glucose tolerance 02/09/2011  . Coronary artery disease      95% followed by 80% mid LAD stenosis, circumflex 80% stenosis. There was a  moderate size branching obtuse marginal with 90-95% stenosis in the inferior branch. The right coronary artery had proximal 30% stenosis. Posterior lateral was moderate-sized with 40% stenosis. The EF was 65-70%. He had a DES and  x 2 to the LAD and DES x 2 to the circumflex 03/08/12.    [name] GERD (gastroesophageal reflux disease)   . Emphysema 04/01/2012    By CT chest, august 2013    Past Surgical History  Procedure Laterality Date  . Inguinal herniorrhapy left  2002    x 2  . Rotator cuff repair      left  . Knee surgery      left  . S/p ventral surgery  2009  . Prostatectomy  2002    radical  . Right hip replacement  09/22/09  . Left hip replacement    . Eye surgery      right  . Percutaneous coronary stent intervention (pci-s) N/A 03/08/2012    Procedure: PERCUTANEOUS CORONARY STENT INTERVENTION (PCI-S);  Surgeon: Burnell Blanks, MD;  Location[facility] CATH LAB;  Service: Cardiovascular;  Laterality: N/A;    Current Outpatient Prescriptions  Medication Sig Dispense Refill  . amLODipine (NORVASC) 10 MG tablet TAKE 1 TABLET BY MOUTH EVERY DAY 30 tablet 11  . aspirin 81 MG EC tablet Take 81 mg by mouth daily.      . citalopram (CELEXA) 40 MG tablet Take 1 tablet (40 mg total) by mouth daily. 90 tablet 3  . cloNIDine (CATAPRES)  0.1 MG tablet Take 1 tablet by mouth every AM and 2 tablets by mouth every PM 270 tablet 3  . clopidogrel (PLAVIX) 75 MG tablet TAKE 1 TABLET BY MOUTH EVERY DAY 30 tablet 2  . ibuprofen (ADVIL,MOTRIN) 200 MG tablet Take 400 mg by mouth every 6 (six) hours as needed. pain    . isosorbide mononitrate (IMDUR) 30 MG 24 hr tablet TAKE 1 TABLET (30 MG TOTAL) BY MOUTH DAILY. 30 tablet 1  . loratadine (CLARITIN) 10 MG tablet Take 10 mg by mouth daily.    [name] lovastatin (MEVACOR) 40 MG tablet TAKE 1 TABLET BY MOUTH AT BEDTIME 30 tablet 3  . pramipexole (MIRAPEX) 1 MG tablet TAKE 1 TABLET (1 MG TOTAL) BY MOUTH 3 (THREE) TIMES DAILY. 90 tablet 5  . pramipexole (MIRAPEX) 1  MG tablet Take 1 mg by mouth daily. Pt takes one 1mg  daily by mouth    . nitroGLYCERIN (NITROSTAT) 0.4 MG SL tablet Place 1 tablet (0.4 mg total) under the tongue every 5 (five) minutes as needed for chest pain. 25 tablet 3  . zolpidem (AMBIEN) 5 MG tablet Take 1 tablet (5 mg total) by mouth at bedtime as needed for sleep. 30 tablet 3   No current facility-administered medications for this visit.    Allergies  Allergen Reactions  . Amitriptyline Hcl     REACTION: confusion  . Diphenhydramine Hcl Other (See Comments)    Keeps him awake  . Simvastatin     REACTION: leg pain    History   Social History  . Marital Status: Married    Spouse Name: N/A    Number of Children: 2  . Years of Education: N/A   Occupational History  . truck driver-retired    Social History Main Topics  . Smoking status: Former Smoker -- 1.00 packs/day for 15 years    Types: Cigarettes    Quit date: 02/05/1977  . Smokeless tobacco: Never Used  . Alcohol Use: 0.5 oz/week    1 drink(s) per week     Comment: RARE  . Drug Use: No  . Sexual Activity: Not Currently   Other Topics Concern  . Not on file   Social History Narrative    Family History  Problem Relation Age of Onset  . Heart attack Father 22    Smoker  . Heart attack Brother 53  . Colon cancer Neg Hx   . Lung cancer Sister     Review of Systems:  As stated in the HPI and otherwise negative.   BP 120/60 mmHg  Pulse 62  Ht 5\' 8"  (1.727 m)  Wt 160 lb 12.8 oz (72.938 kg)  BMI 24.46 kg/m2  SpO2 97%  Physical Examination: General: Well developed, well nourished, NAD HEENT: OP clear, mucus membranes moist SKIN: warm, dry. No rashes. Neuro: No focal deficits Musculoskeletal: Muscle strength 5/5 all ext Psychiatric: Mood and affect normal Neck: No JVD, no carotid bruits, no thyromegaly, no lymphadenopathy. Lungs:Clear bilaterally, no wheezes, rhonci, crackles Cardiovascular: Regular rate and rhythm. No murmurs, gallops or  rubs. Abdomen:Soft. Bowel sounds present. Non-tender.  Extremities: No lower extremity edema. Pulses are 2 + in the bilateral DP/PT.  EKG: NSR, rate 62 bpm  Assessment and Plan:   1. CAD: Stable. Continue ASA and Plavix. No beta blocker with braydcardia.   2. HTN: BP is well controlled. Continue other meds.   3. GERD: Seems to be having more reflux type symptoms. Will change   4.  HLD: Continue  statin. Lipids well controlled.   5. Carotid artery disease: Bilateral 40-50% stenosis. Repeat one year.

## 2014-08-27 ENCOUNTER — Encounter (HOSPITAL_COMMUNITY): Payer: Self-pay | Admitting: Cardiovascular Disease

## 2014-09-09 ENCOUNTER — Other Ambulatory Visit: Payer: Self-pay | Admitting: Cardiovascular Disease

## 2014-10-16 ENCOUNTER — Other Ambulatory Visit: Payer: Self-pay | Admitting: *Deleted

## 2014-10-16 ENCOUNTER — Other Ambulatory Visit: Payer: Self-pay

## 2014-10-16 MED ORDER — AMLODIPINE BESYLATE 10 MG PO TABS: 10.0000 mg | ORAL_TABLET | Freq: Every day | ORAL | Status: DC

## 2014-10-16 MED ORDER — CLOPIDOGREL BISULFATE 75 MG PO TABS: 75.0000 mg | ORAL_TABLET | Freq: Every day | ORAL | Status: DC

## 2014-10-16 NOTE — Telephone Encounter (Signed)
Rx sent to pharmacy per request.

## 2014-10-20 ENCOUNTER — Encounter: Payer: Self-pay | Admitting: Internal Medicine

## 2014-10-20 ENCOUNTER — Other Ambulatory Visit (INDEPENDENT_AMBULATORY_CARE_PROVIDER_SITE_OTHER): Payer: Medicare Other

## 2014-10-20 ENCOUNTER — Ambulatory Visit (INDEPENDENT_AMBULATORY_CARE_PROVIDER_SITE_OTHER): Payer: Medicare Other | Admitting: Internal Medicine

## 2014-10-20 VITALS — BP 118/70 | HR 55 | Temp 97.8°F | Resp 18 | Ht 68.0 in | Wt 158.1 lb

## 2014-10-20 DIAGNOSIS — R911 Solitary pulmonary nodule: Secondary | ICD-10-CM | POA: Diagnosis not present

## 2014-10-20 DIAGNOSIS — Z0000 Encounter for general adult medical examination without abnormal findings: Secondary | ICD-10-CM | POA: Diagnosis not present

## 2014-10-20 DIAGNOSIS — Z8546 Personal history of malignant neoplasm of prostate: Secondary | ICD-10-CM

## 2014-10-20 DIAGNOSIS — E785 Hyperlipidemia, unspecified: Secondary | ICD-10-CM | POA: Diagnosis not present

## 2014-10-20 DIAGNOSIS — Z23 Encounter for immunization: Secondary | ICD-10-CM | POA: Diagnosis not present

## 2014-10-20 DIAGNOSIS — I10 Essential (primary) hypertension: Secondary | ICD-10-CM

## 2014-10-20 LAB — URINALYSIS, ROUTINE W REFLEX MICROSCOPIC
Bilirubin Urine: NEGATIVE
Hgb urine dipstick: NEGATIVE
Ketones, ur: NEGATIVE
LEUKOCYTES UA: NEGATIVE
NITRITE: NEGATIVE
PH: 5.5 (ref 5.0–8.0)
RBC / HPF: NONE SEEN (ref 0–?)
Specific Gravity, Urine: 1.025 (ref 1.000–1.030)
Total Protein, Urine: 100 — AB
Urine Glucose: NEGATIVE
Urobilinogen, UA: 0.2 (ref 0.0–1.0)

## 2014-10-20 LAB — HEPATIC FUNCTION PANEL
ALBUMIN: 3.9 g/dL (ref 3.5–5.2)
ALT: 10 U/L (ref 0–53)
AST: 12 U/L (ref 0–37)
Alkaline Phosphatase: 73 U/L (ref 39–117)
Bilirubin, Direct: 0.2 mg/dL (ref 0.0–0.3)
Total Bilirubin: 0.7 mg/dL (ref 0.2–1.2)
Total Protein: 6.2 g/dL (ref 6.0–8.3)

## 2014-10-20 LAB — LIPID PANEL
Cholesterol: 133 mg/dL (ref 0–200)
HDL: 35.8 mg/dL — ABNORMAL LOW (ref >39.00)
LDL Cholesterol: 82 mg/dL (ref 0–99)
NonHDL: 97.2
Total CHOL/HDL Ratio: 4
Triglycerides: 77 mg/dL (ref 0.0–149.0)
VLDL: 15.4 mg/dL (ref 0.0–40.0)

## 2014-10-20 LAB — CBC WITH DIFFERENTIAL/PLATELET
Basophils Absolute: 0 10*3/uL (ref 0.0–0.1)
Basophils Relative: 0.4 % (ref 0.0–3.0)
Eosinophils Absolute: 0.2 10*3/uL (ref 0.0–0.7)
Eosinophils Relative: 3.1 % (ref 0.0–5.0)
HEMATOCRIT: 36.5 % — AB (ref 39.0–52.0)
Hemoglobin: 12.4 g/dL — ABNORMAL LOW (ref 13.0–17.0)
LYMPHS ABS: 1.8 10*3/uL (ref 0.7–4.0)
Lymphocytes Relative: 22.9 % (ref 12.0–46.0)
MCHC: 34 g/dL (ref 30.0–36.0)
MCV: 85.7 fl (ref 78.0–100.0)
MONO ABS: 0.8 10*3/uL (ref 0.1–1.0)
Monocytes Relative: 9.6 % (ref 3.0–12.0)
Neutro Abs: 5 10*3/uL (ref 1.4–7.7)
Neutrophils Relative %: 64 % (ref 43.0–77.0)
PLATELETS: 179 10*3/uL (ref 150.0–400.0)
RBC: 4.25 Mil/uL (ref 4.22–5.81)
RDW: 12.6 % (ref 11.5–15.5)
WBC: 7.8 10*3/uL (ref 4.0–10.5)

## 2014-10-20 LAB — BASIC METABOLIC PANEL
BUN: 26 mg/dL — AB (ref 6–23)
CALCIUM: 9.3 mg/dL (ref 8.4–10.5)
CHLORIDE: 107 meq/L (ref 96–112)
CO2: 28 mEq/L (ref 19–32)
Creatinine, Ser: 1.59 mg/dL — ABNORMAL HIGH (ref 0.40–1.50)
GFR: 45.32 mL/min — ABNORMAL LOW (ref >60.00)
Glucose, Bld: 109 mg/dL — ABNORMAL HIGH (ref 70–99)
Potassium: 4.7 mEq/L (ref 3.5–5.1)
SODIUM: 139 meq/L (ref 135–145)

## 2014-10-20 LAB — PSA: PSA: 6.39 ng/mL — ABNORMAL HIGH (ref 0.10–4.00)

## 2014-10-20 LAB — TSH: TSH: 3.79 u[IU]/mL (ref 0.35–4.50)

## 2014-10-20 NOTE — Assessment & Plan Note (Signed)
Due for 3 yr f/u ct chest - if no change, then no further imaging needed

## 2014-10-20 NOTE — Progress Notes (Signed)
Subjective:    Patient ID: Victor Davis, male    DOB: 01/30/1941, 74 y.o.   MRN: [id number]  HPI  Here for wellness and f/u;  Overall doing ok;  Pt denies Chest pain, worsening SOB, DOE, wheezing, orthopnea, PND, worsening LE edema, palpitations, dizziness or syncope.  Pt denies neurological change such as new headache, facial or extremity weakness.  Pt denies polydipsia, polyuria, or low sugar symptoms. Pt states overall good compliance with treatment and medications, good tolerability, and has been trying to follow appropriate diet.  Pt denies worsening depressive symptoms, suicidal ideation or panic. No fever, night sweats, wt loss, loss of appetite, or other constitutional symptoms.  Pt states good ability with ADL's, has low fall risk, home safety reviewed and adequate, no other significant changes in hearing or vision, and only occasionally active with exercise. Due for f/u Right pulm nodule, quit smoking x 38 yr, prior 1 ppd x 6 yrs. Wife not present today, has upcoming back surgury later this month/fusion. Past Medical History  Diagnosis Date  . BRADYCARDIA 10/24/2010  . CALF PAIN, RIGHT 10/22/2009  . 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 Dysuria 10/10/2010  . ERECTILE DYSFUNCTION 04/05/2007  . FATIGUE 10/02/2007  . GLUCOSE INTOLERANCE 07/15/2008  . HYPERLIPIDEMIA 07/30/2008  . HYPERSOMNIA 10/02/2007  . INSOMNIA-SLEEP DISORDER-UNSPEC 10/22/2009  . LOW BACK PAIN 04/05/2007  . OSTEOARTHRITIS 04/05/2007  . PEPTIC ULCER DISEASE 04/05/2007  . PERIPHERAL EDEMA 10/22/2009  . PERIPHERAL VASCULAR DISEASE 07/30/2008  . PROSTATE CANCER, HX OF 04/05/2007  . RESTLESS LEG SYNDROME 05/15/2007  . RHINITIS, ALLERGIC NOS 05/15/2007  . SHOULDER PAIN, LEFT 01/22/2008  . SINUSITIS- ACUTE-NOS 05/15/2007  . Unspecified essential hypertension 04/05/2007  . Unspecified visual loss 07/15/2008  . UTI 10/24/2010  . Impaired glucose tolerance 02/09/2011  . Coronary artery disease      95% followed by 80% mid LAD  stenosis, circumflex 80% stenosis. There was a moderate size branching obtuse marginal with 90-95% stenosis in the inferior branch. The right coronary artery had proximal 30% stenosis. Posterior lateral was moderate-sized with 40% stenosis. The EF was 65-70%. He had a DES and  x 2 to the LAD and DES x 2 to the circumflex 03/08/12.    [name] GERD (gastroesophageal reflux disease)   . Emphysema 04/01/2012    By CT chest, august 2013   Past Surgical History  Procedure Laterality Date  . Inguinal herniorrhapy left  2002    x 2  . Rotator cuff repair      left  . Knee surgery      left  . S/p ventral surgery  2009  . Prostatectomy  2002    radical  . Right hip replacement  09/22/09  . Left hip replacement    . Eye surgery      right  . Percutaneous coronary stent intervention (pci-s) N/A 03/08/2012    Procedure: PERCUTANEOUS CORONARY STENT INTERVENTION (PCI-S);  Surgeon: Burnell Blanks, MD;  Location: [facility] CATH LAB;  Service: Cardiovascular;  Laterality: N/A;    reports that he quit smoking about 37 years ago. His smoking use included Cigarettes. He has a 15 pack-year smoking history. He has never used smokeless tobacco. He reports that he drinks about 0.5 oz of alcohol per week. He reports that he does not use illicit drugs. family history includes Heart attack (age of onset: 75) in his father; Heart attack (age of onset: 57) in his brother; Lung cancer in his sister. There is no history of Colon  cancer. Allergies  Allergen Reactions  . Amitriptyline Hcl     REACTION: confusion  . Diphenhydramine Hcl Other (See Comments)    Keeps him awake  . Simvastatin     REACTION: leg pain   Current Outpatient Prescriptions on File Prior to Visit  Medication Sig Dispense Refill  . amLODipine (NORVASC) 10 MG tablet Take 1 tablet (10 mg total) by mouth daily. 90 tablet 3  . aspirin 81 MG EC tablet Take 81 mg by mouth daily.      . citalopram (CELEXA) 40 MG tablet Take 1 tablet (40 mg total) by mouth  daily. 90 tablet 3  . cloNIDine (CATAPRES) 0.1 MG tablet Take 1 tablet by mouth every AM and 2 tablets by mouth every PM 270 tablet 3  . clopidogrel (PLAVIX) 75 MG tablet Take 1 tablet (75 mg total) by mouth daily. 90 tablet 1  . ibuprofen (ADVIL,MOTRIN) 200 MG tablet Take 400 mg by mouth every 6 (six) hours as needed. pain    . isosorbide mononitrate (IMDUR) 30 MG 24 hr tablet TAKE 1 TABLET (30 MG TOTAL) BY MOUTH DAILY. 30 tablet 5  . loratadine (CLARITIN) 10 MG tablet Take 10 mg by mouth daily.    [name] lovastatin (MEVACOR) 40 MG tablet TAKE 1 TABLET BY MOUTH AT BEDTIME 30 tablet 3  . nitroGLYCERIN (NITROSTAT) 0.4 MG SL tablet Place 1 tablet (0.4 mg total) under the tongue every 5 (five) minutes as needed for chest pain. 25 tablet 6  . pantoprazole (PROTONIX) 40 MG tablet Take 1 tablet (40 mg total) by mouth daily. 30 tablet 11  . pramipexole (MIRAPEX) 1 MG tablet TAKE 1 TABLET (1 MG TOTAL) BY MOUTH 3 (THREE) TIMES DAILY. 90 tablet 5  . pramipexole (MIRAPEX) 1 MG tablet Take 1 mg by mouth daily. Pt takes one 1mg  daily by mouth    . zolpidem (AMBIEN) 5 MG tablet Take 1 tablet (5 mg total) by mouth at bedtime as needed for sleep. 30 tablet 3   No current facility-administered medications on file prior to visit.    Review of Systems Constitutional: Negative for increased diaphoresis, other activity, appetite or siginficant weight change other than noted HENT: Negative for worsening hearing loss, ear pain, facial swelling, mouth sores and neck stiffness.   Eyes: Negative for other worsening pain, redness or visual disturbance.  Respiratory: Negative for shortness of breath and wheezing  Cardiovascular: Negative for chest pain and palpitations.  Gastrointestinal: Negative for diarrhea, blood in stool, abdominal distention or other pain Genitourinary: Negative for hematuria, flank pain or change in urine volume.  Musculoskeletal: Negative for myalgias or other joint complaints.  Skin: Negative for  color change and wound or drainage.  Neurological: Negative for syncope and numbness. other than noted Hematological: Negative for adenopathy. or other swelling Psychiatric/Behavioral: Negative for hallucinations, SI, self-injury, decreased concentration or other worsening agitation.      Objective:   Physical Exam BP 118/70 mmHg  Pulse 55  Temp(Src) 97.8 F (36.6 C) (Oral)  Resp 18  Ht 5\' 8"  (1.727 m)  Wt 158 lb 1.9 oz (71.723 kg)  BMI 24.05 kg/m2  SpO2 97% VS noted,  Constitutional: Pt is oriented to person, place, and time. Appears well-developed and well-nourished, in no significant distress Head: Normocephalic and atraumatic.  Right Ear: External ear normal.  Left Ear: External ear normal.  Nose: Nose normal.  Mouth/Throat: Oropharynx is clear and moist.  Eyes: Conjunctivae and EOM are normal. Pupils are equal, round, and reactive to  light.  Neck: Normal range of motion. Neck supple. No JVD present. No tracheal deviation present or significant neck LA or mass Cardiovascular: Normal rate, regular rhythm, normal heart sounds and intact distal pulses.   Pulmonary/Chest: Effort normal and breath sounds without rales or wheezing  Abdominal: Soft. Bowel sounds are normal. NT. No HSM  Musculoskeletal: Normal range of motion. Exhibits no edema.  Lymphadenopathy:  Has no cervical adenopathy.  Neurological: Pt is alert and oriented to person, place, and time. Pt has normal reflexes. No cranial nerve deficit. Motor grossly intact Skin: Skin is warm and dry. No rash noted.  Psychiatric:  Has normal mood and affect. Behavior is normal.     Assessment & Plan:

## 2014-10-20 NOTE — Addendum Note (Signed)
Addended by: Valerie Salts on: 10/20/2014 10:16 AM   Modules accepted: Orders

## 2014-10-20 NOTE — Assessment & Plan Note (Signed)

## 2014-10-20 NOTE — Progress Notes (Signed)
Pre visit review using our clinic review tool, if applicable. No additional management support is needed unless otherwise documented below in the visit note. 

## 2014-10-20 NOTE — Patient Instructions (Signed)
You had the Tetenus shot today (Td)  Please continue all other medications as before, and refills have been done if requested.  Please have the pharmacy call with any other refills you may need.  Please continue your efforts at being more active, low cholesterol diet, and weight control.  You are otherwise up to date with prevention measures today.  Please keep your appointments with your specialists as you may have planned  You will be contacted regarding the referral for: CT chest  Please go to the LAB in the Basement (turn left off the elevator) for the tests to be done today  You will be contacted by phone if any changes need to be made immediately.  Otherwise, you will receive a letter about your results with an explanation, but please check with MyChart first.  Please remember to sign up for MyChart if you have not done so, as this will be important to you in the future with finding out test results, communicating by private email, and scheduling acute appointments online when needed.  Please return in 6 months, or sooner if needed

## 2014-10-29 ENCOUNTER — Telehealth: Payer: Self-pay | Admitting: Internal Medicine

## 2014-10-29 DIAGNOSIS — R972 Elevated prostate specific antigen [PSA]: Secondary | ICD-10-CM

## 2014-10-29 NOTE — Telephone Encounter (Signed)
-----   Message from Valerie Salts, [place] sent at 10/29/2014 11:41 AM EDT ----- Pt. Aware. Does not having an appt with [facility].

## 2014-11-02 DIAGNOSIS — C61 Malignant neoplasm of prostate: Secondary | ICD-10-CM | POA: Diagnosis not present

## 2014-11-05 ENCOUNTER — Ambulatory Visit (INDEPENDENT_AMBULATORY_CARE_PROVIDER_SITE_OTHER)
Admission: RE | Admit: 2014-11-05 | Discharge: 2014-11-05 | Disposition: A | Discharge status: Home / Self Care | Payer: Medicare Other | Source: Ambulatory Visit | Attending: Internal Medicine | Admitting: Internal Medicine

## 2014-11-05 ENCOUNTER — Other Ambulatory Visit: Payer: Self-pay | Admitting: Internal Medicine

## 2014-11-05 ENCOUNTER — Encounter: Payer: Self-pay | Admitting: Internal Medicine

## 2014-11-05 DIAGNOSIS — R911 Solitary pulmonary nodule: Secondary | ICD-10-CM

## 2014-11-05 DIAGNOSIS — R9389 Abnormal findings on diagnostic imaging of other specified body structures: Secondary | ICD-10-CM

## 2014-11-05 DIAGNOSIS — R918 Other nonspecific abnormal finding of lung field: Secondary | ICD-10-CM | POA: Diagnosis not present

## 2014-11-09 ENCOUNTER — Other Ambulatory Visit (HOSPITAL_COMMUNITY): Payer: Self-pay | Admitting: Cardiology

## 2014-11-09 DIAGNOSIS — I6523 Occlusion and stenosis of bilateral carotid arteries: Secondary | ICD-10-CM

## 2014-11-11 ENCOUNTER — Telehealth: Payer: Self-pay | Admitting: Internal Medicine

## 2014-11-11 NOTE — Telephone Encounter (Signed)
Returning your call about lab results

## 2014-11-13 NOTE — Telephone Encounter (Signed)
Called patient back and went over lab results.

## 2014-11-16 ENCOUNTER — Ambulatory Visit (HOSPITAL_COMMUNITY): Payer: Medicare Other | Attending: Cardiology | Admitting: Cardiology

## 2014-11-16 DIAGNOSIS — I6523 Occlusion and stenosis of bilateral carotid arteries: Secondary | ICD-10-CM | POA: Insufficient documentation

## 2014-11-16 NOTE — Progress Notes (Signed)
Carotid duplex performed 

## 2014-11-26 ENCOUNTER — Encounter: Payer: Self-pay | Admitting: Internal Medicine

## 2014-11-26 ENCOUNTER — Ambulatory Visit (INDEPENDENT_AMBULATORY_CARE_PROVIDER_SITE_OTHER): Payer: Medicare Other | Admitting: Internal Medicine

## 2014-11-26 DIAGNOSIS — R911 Solitary pulmonary nodule: Secondary | ICD-10-CM | POA: Diagnosis not present

## 2014-11-26 NOTE — Patient Instructions (Signed)
Please see patient coordinator before you leave today  to schedule Thoracic surgery evaluation as you have a very slowly growing nodule on your right lung that probably will need to be removed to prove whether or not it's a tumor

## 2014-11-26 NOTE — Progress Notes (Signed)
   Subjective:    Patient ID: Victor Davis, male    DOB: 18-Dec-1940,    MRN: [id number]  HPI  75 yowm  Quit smoking 1978 referred by Dr Jenny Reichmann to pulmonary clinic 11/26/2014 for abn cxr  11/26/2014 1st [facility] office visit/ Victor Davis   Chief Complaint  Patient presents with  . pulmonary consult    Referred by Dr Jenny Reichmann for abnormal finding of chest CT. Denies SOB, , wheezing, or chest pain . Does have mild productive cough      syncope > CT 03/2012 Persistent ground-glass nodule within the superior segment of the right lower lobe measuring 9 mm> same on 03/2013 CT > CT 11/05/14  the peripheral ground-glass opacity within the superior segment of the right lower lobe extending toward the pleura has increased slightly in size, measuring 12 mm on axial image 25 compared to 8 mm    Not very active due to taking care of his wife but Not limited by breathing from desired activities  - mental status back to baseline p syncope  No obvious other patterns in day to day or daytime variabilty or assoc chronic cough or cp or chest tightness, subjective wheeze overt sinus or hb symptoms. No unusual exp hx or h/o childhood pna/ asthma or knowledge of premature birth.  Sleeping ok without nocturnal  or early am exacerbation  of respiratory  c/o's or need for noct saba. Also denies any obvious fluctuation of symptoms with weather or environmental changes or other aggravating or alleviating factors except as outlined above   Current Medications, Allergies, Complete Past Medical History, Past Surgical History, Family History, and Social History were reviewed in [employer] record.              Review of Systems  Constitutional: Negative for fever, chills, activity change, appetite change and unexpected weight change.  HENT: Negative for congestion, dental problem, postnasal drip, rhinorrhea, sneezing, sore throat, trouble swallowing and voice change.   Eyes: Negative for visual  disturbance.  Respiratory: Positive for cough. Negative for choking and shortness of breath.   Cardiovascular: Negative for chest pain and leg swelling.  Gastrointestinal: Negative for nausea, vomiting and abdominal pain.  Genitourinary: Negative for difficulty urinating.  Musculoskeletal: Negative for arthralgias.  Skin: Negative for rash.  Psychiatric/Behavioral: Positive for confusion. Negative for behavioral problems.       Objective:   Physical Exam  Pleasant amb wm nad  Wt Readings from Last 3 Encounters:  11/26/14 157 lb (71.215 kg)  10/20/14 158 lb 1.9 oz (71.723 kg)  08/20/14 160 lb 12.8 oz (72.938 kg)    Vital signs reviewed  HEENT: nl dentition, turbinates, and orophanx. Nl external ear canals without cough reflex   NECK :  without JVD/Nodes/TM/ nl carotid upstrokes bilaterally   LUNGS: no acc muscle use, clear to A and P bilaterally without cough on insp or exp maneuvers   CV:  RRR  no s3 or murmur or increase in P2, no edema   ABD:  soft and nontender with nl excursion in the supine position. No bruits or organomegaly, bowel sounds nl  MS:  warm without deformities, calf tenderness, cyanosis or clubbing  SKIN: warm and dry without lesions    NEURO:  alert, approp, no deficits          Assessment & Plan:

## 2014-11-27 ENCOUNTER — Encounter: Payer: Self-pay | Admitting: Internal Medicine

## 2014-11-27 NOTE — Assessment & Plan Note (Addendum)
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, and the only way to be sure about its identity is excisional bx in an operative candidate for VATS which he certainly appear to be   3) he could easily tol a sup segmentectomy though a RLLobectomy would be more difficult for him and take him down well below an FEV1 of 50% but above a 30% pre op predicted so I would hope the lesser surgery would be feasible but would leave that to T surgery if this proves to be cancer  4) PET scanning can be done but it not likely to be helpful given how slow this is growing with high false neg rate in this setting  5) timing of surgery is problematic in that his wife just had back surgery and now is facing breast surgery but since this is such a slow growing lesion it would probably  be fine to postpone it for a few months if needed  6) Discussed in detail all the  indications, usual  risks and alternatives  relative to the benefits with patient who agrees to proceed with t surgery evaluation

## 2014-12-01 DIAGNOSIS — C61 Malignant neoplasm of prostate: Secondary | ICD-10-CM | POA: Diagnosis not present

## 2014-12-01 DIAGNOSIS — N5201 Erectile dysfunction due to arterial insufficiency: Secondary | ICD-10-CM | POA: Diagnosis not present

## 2014-12-01 DIAGNOSIS — N393 Stress incontinence (female) (male): Secondary | ICD-10-CM | POA: Diagnosis not present

## 2014-12-04 ENCOUNTER — Encounter: Payer: Medicare Other | Admitting: Thoracic Surgery (Cardiothoracic Vascular Surgery)

## 2014-12-06 NOTE — Op Note (Signed)
PATIENT NAME:  Victor Davis, Victor Davis MR#:  [id number] DATE OF BIRTH:  [birth year]  DATE OF PROCEDURE:  12/06/2011  PREOPERATIVE DIAGNOSIS: Full-thickness macular hole, right eye.   POSTOPERATIVE DIAGNOSIS: Full-thickness macular hole, right eye.  PROCEDURES PERFORMED:  1. Pars plana vitrectomy of the right eye.  2. Internal limiting membrane peel of the right eye.  3. Gas exchange of the right eye.   PRIMARY SURGEON: Garlan Fair, MD  ANESTHESIA: General endotracheal anesthesia with a supplemental retrobulbar block.   COMPLICATIONS: None.   ESTIMATED BLOOD LOSS: Less than 1 mL.   INDICATIONS FOR PROCEDURE: This is a patient who presented to my office with decreasing vision in the right eye. Examination revealed a full thickness macular hole of the right eye. The risks, benefits, and alternatives of the above procedure were discussed and the patient wished to proceed.   DETAILS OF PROCEDURE: After informed consent was obtained, the patient was brought into the operative suite at [facility]. The patient was placed in supine position and was given a small dose of propofol and a retrobulbar block was performed on the right eye by the primary surgeon. Due to extensive movement and restless leg, the decision was made to convert general anesthesia for safety of the patient. This induction was completed by the anesthesia team without any complications. The right eye was prepped and draped in sterile manner. After lid speculum was inserted, a 25-gauge trocar was placed inferotemporally through displaced conjunctiva in an oblique fashion 4 mm beyond the limbus. The infusion cannula was turned on and inserted through the trocar and secured into position with Steri-Strips. Two more trocars were placed in a similar fashion superotemporally and superonasally. The vitreous cutter and light pipe were introduced into the eye and a core vitrectomy was performed. The vitreous face was  carefully elevated off of the retina without any complications. The peripheral vitreous was then trimmed for 360 degrees. Indocyanine green was injected onto the posterior pole and was removed within 30 seconds. A complete internal limiting membrane peel was completed for 360 degrees around the fovea for a total diameter of 2 disk diameters. A scleral depressed exam was then performed for 360 degrees and no signs of any breaks or tears could be identified for 360 degrees. A complete air-fluid exchange was performed. Four minutes was allowed to elapse for dehydration of the vitreous base. This remnant fluid was removed. 22% SF6 was used as an air gas exchange and the trocars were removed. The wounds were airtight and the pressure in the eye was confirmed to be approximately 15 mmHg. 5 mg of dexamethasone was given into the inferior fornix and the lid speculum was removed. The eye was cleaned and TobraDex was placed on the eye. A patch and shield were placed over the eye, and the patient was taken to postanesthesia care anesthesia care after reversal and was told to remain face down. ____________________________ Teresa Pelton. Starling Manns, MD mfa:slb D: 12/06/2011 09:18:42 ET T: 12/06/2011 09:36:17 ET JOB#: [id number]  cc: Teresa Pelton. Starling Manns, MD, <Dictator> Coralee Rud MD ELECTRONICALLY SIGNED 12/20/2011 7:10

## 2014-12-10 ENCOUNTER — Institutional Professional Consult (permissible substitution) (INDEPENDENT_AMBULATORY_CARE_PROVIDER_SITE_OTHER): Payer: Medicare Other | Admitting: Thoracic Surgery (Cardiothoracic Vascular Surgery)

## 2014-12-10 ENCOUNTER — Encounter: Payer: Self-pay | Admitting: Thoracic Surgery (Cardiothoracic Vascular Surgery)

## 2014-12-10 VITALS — BP 175/84 | HR 73 | Resp 20 | Ht 68.0 in | Wt 159.0 lb

## 2014-12-10 DIAGNOSIS — R911 Solitary pulmonary nodule: Secondary | ICD-10-CM | POA: Diagnosis not present

## 2014-12-10 NOTE — Progress Notes (Signed)
PCP is Cathlean Cower, MD Referring Provider is Tanda Rockers, MD  Chief Complaint  Patient presents with  . Lung Lesion    Surgical eval on right lung nodule, Chest CT 11/05/14    HPI: 74 year old man sent for consultation regarding a groundglass nodule in the right lower lobe.  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 He has a history of tobacco abuse, smoking 2 packs of cigarettes daily for about 15 years prior to quitting in 1978. His past medical history is extensive and includes a cerebrovascular accident (he does not recall this), coronary artery disease with stenting by Dr. Julianne Handler, bradycardia, prostate cancer, arthritis, hypertension, hyperlipidemia, reflux, and emphysema. He saw Dr. Jenny Reichmann back in 2013 with a complaint of cough and shortness of breath. Chest x-ray showed a possible nodule. A CT of the chest was done and showed an 8 mm groundglass opacity in the superior segment of the right lower lobe. A follow-up scan 3 months later showed the nodule is still present. A year later (August 2014) the nodule measured 9 mm and this was not felt to be significant. He recently saw Dr. Jenny Reichmann for an annual physical. Dr. Jenny Reichmann ordered a repeat CT to follow-up the nodule. The nodule had increased in size from 9 to 12 mm. There were no other suspicious findings.  Despite his numerous health problems Victor Davis claims that he is doing well. He says that he'll get short of breath when he is mowing his yard because there is a hill that he has to push a lawn more up. He does not short of breath walking up and down his driveway. He denies any chest pain, pressure, or tightness. His weight has been stable. He has an occasional cough, but no hemoptysis. He denies wheezing. He does have reflux and occasional indigestion, as well as arthritis. He has been under a great deal of stress recently because his wife just  had back surgery and now has surgery scheduled for breast cancer.  Zubrod Score: At the time of surgery this patient's most appropriate activity status/level should be described as: '[]'$     0    Normal activity, no symptoms '[x]'$     1    Restricted in physical strenuous activity but ambulatory, able to do out light work '[]'$     2    Ambulatory and capable of self care, unable to do work activities, up and about >50 % of waking hours                              '[]'$     3    Only limited self care, in bed greater than 50% of waking hours '[]'$     4    Completely disabled, no self care, confined to bed or chair '[]'$     5    Moribund   Past Medical History  Diagnosis Date  . BRADYCARDIA 10/24/2010  . CALF PAIN, RIGHT 10/22/2009  . 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 Dysuria 10/10/2010  . ERECTILE DYSFUNCTION 04/05/2007  . FATIGUE 10/02/2007  . GLUCOSE INTOLERANCE 07/15/2008  . HYPERLIPIDEMIA 07/30/2008  . HYPERSOMNIA 10/02/2007  . INSOMNIA-SLEEP DISORDER-UNSPEC 10/22/2009  . LOW BACK PAIN 04/05/2007  . OSTEOARTHRITIS 04/05/2007  . PEPTIC ULCER DISEASE 04/05/2007  . PERIPHERAL EDEMA 10/22/2009  . PERIPHERAL VASCULAR DISEASE 07/30/2008  . PROSTATE CANCER, HX  OF 04/05/2007  . RESTLESS LEG SYNDROME 05/15/2007  . RHINITIS, ALLERGIC NOS 05/15/2007  . SHOULDER PAIN, LEFT 01/22/2008  . SINUSITIS- ACUTE-NOS 05/15/2007  . Unspecified essential hypertension 04/05/2007  . Unspecified visual loss 07/15/2008  . UTI 10/24/2010  . Impaired glucose tolerance 02/09/2011  . Coronary artery disease      95% followed by 80% mid LAD stenosis, circumflex 80% stenosis. There was a moderate size branching obtuse marginal with 90-95% stenosis in the inferior branch. The right coronary artery had proximal 30% stenosis. Posterior lateral was moderate-sized with 40% stenosis. The EF was 65-70%. He had a DES and  x 2 to the LAD and DES x 2 to the circumflex 03/08/12.    [name] GERD (gastroesophageal reflux disease)   . Emphysema 04/01/2012     By CT chest, august 2013    Past Surgical History  Procedure Laterality Date  . Inguinal herniorrhapy left  2002    x 2  . Rotator cuff repair      left  . Knee surgery      left  . S/p ventral surgery  2009  . Prostatectomy  2002    radical  . Right hip replacement  09/22/09  . Left hip replacement    . Eye surgery      right  . Percutaneous coronary stent intervention (pci-s) N/A 03/08/2012    Procedure: PERCUTANEOUS CORONARY STENT INTERVENTION (PCI-S);  Surgeon: Burnell Blanks, MD;  Location: [facility] CATH LAB;  Service: Cardiovascular;  Laterality: N/A;    Family History  Problem Relation Age of Onset  . Heart attack Father 12    Smoker  . Heart attack Brother 43  . Colon cancer Neg Hx   . Lung cancer Sister     Social History History  Substance Use Topics  . Smoking status: Former Smoker -- 1.00 packs/day for 15 years    Types: Cigarettes    Quit date: 02/05/1977  . Smokeless tobacco: Never Used  . Alcohol Use: 0.5 oz/week    1 drink(s) per week     Comment: RARE    Current Outpatient Prescriptions  Medication Sig Dispense Refill  . amLODipine (NORVASC) 10 MG tablet Take 1 tablet (10 mg total) by mouth daily. 90 tablet 3  . aspirin 81 MG EC tablet Take 81 mg by mouth daily.      . citalopram (CELEXA) 40 MG tablet Take 1 tablet (40 mg total) by mouth daily. 90 tablet 3  . cloNIDine (CATAPRES) 0.1 MG tablet Take 1 tablet by mouth every AM and 2 tablets by mouth every PM 270 tablet 3  . clopidogrel (PLAVIX) 75 MG tablet Take 1 tablet (75 mg total) by mouth daily. 90 tablet 1  . ibuprofen (ADVIL,MOTRIN) 200 MG tablet Take 400 mg by mouth every 6 (six) hours as needed. pain    . isosorbide mononitrate (IMDUR) 30 MG 24 hr tablet TAKE 1 TABLET (30 MG TOTAL) BY MOUTH DAILY. 30 tablet 5  . loratadine (CLARITIN) 10 MG tablet Take 10 mg by mouth daily.    [name] lovastatin (MEVACOR) 40 MG tablet TAKE 1 TABLET BY MOUTH AT BEDTIME 30 tablet 3  . nitroGLYCERIN (NITROSTAT) 0.4  MG SL tablet Place 1 tablet (0.4 mg total) under the tongue every 5 (five) minutes as needed for chest pain. 25 tablet 6  . pantoprazole (PROTONIX) 40 MG tablet Take 1 tablet (40 mg total) by mouth daily. 30 tablet 11  . pramipexole (MIRAPEX) 1 MG tablet TAKE 1  TABLET (1 MG TOTAL) BY MOUTH 3 (THREE) TIMES DAILY. 90 tablet 5  . pramipexole (MIRAPEX) 1 MG tablet Take 1 mg by mouth daily. Pt takes one '1mg'$  daily by mouth    . zolpidem (AMBIEN) 5 MG tablet Take 1 tablet (5 mg total) by mouth at bedtime as needed for sleep. 30 tablet 3   No current facility-administered medications for this visit.    Allergies  Allergen Reactions  . Amitriptyline Hcl     REACTION: confusion  . Diphenhydramine Hcl Other (See Comments)    Keeps him awake  . Simvastatin     REACTION: leg pain    Review of Systems  Constitutional: Negative for fever, chills, activity change, fatigue and unexpected weight change.  Respiratory: Positive for cough (rare, denies hemoptysis) and shortness of breath (with heavy exertion). Negative for wheezing.   Cardiovascular: Positive for leg swelling. Negative for chest pain.  Gastrointestinal:       Indigestion  Genitourinary:       Prostate CA- on lupron  Musculoskeletal: Positive for arthralgias.  Neurological: Negative.   Hematological: Bruises/bleeds easily.  Psychiatric/Behavioral:       Stress due to wife's illness  All other systems reviewed and are negative.   BP 175/84 mmHg  Pulse 73  Resp 20  Ht '5\' 8"'$  (1.727 m)  Wt 159 lb (72.122 kg)  BMI 24.18 kg/m2  SpO2 96% Physical Exam  Constitutional: He is oriented to person, place, and time. No distress.  Thin, elderly  HENT:  Head: Normocephalic and atraumatic.  Eyes: EOM are normal.  Neck: Neck supple. No thyromegaly present.  Cardiovascular: Normal rate, regular rhythm, normal heart sounds and intact distal pulses.   No murmur heard. Pulmonary/Chest: Effort normal and breath sounds normal. He has no  wheezes. He has no rales.  Abdominal: Soft. There is no tenderness.  Musculoskeletal: He exhibits edema (1+ on R, trace on L).  Lymphadenopathy:    He has no cervical adenopathy.  Neurological: He is alert and oriented to person, place, and time. No cranial nerve deficit.  No focal motor deficit  Skin: Skin is dry.  Psychiatric: He has a normal mood and affect.  Vitals reviewed.    Diagnostic Tests: CT CHEST WITHOUT CONTRAST  TECHNIQUE: Multidetector CT imaging of the chest was performed following the standard protocol without IV contrast.  COMPARISON: CT chest of 04/08/2013 and 12/29/2011  FINDINGS: When compared to the CT from 2013, the peripheral ground-glass opacity within the superior segment of the right lower lobe extending toward the pleura has increased slightly in size, measuring 12 mm on axial image 25 compared to 8 mm on the prior study. When reviewed in coronal and sagittal images, this ground-glass opacity is more prominent than in 2013. 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 Since this nodule has increased somewhat in size in the intervening 3 years, surgical consultation could be considered. If that is not done then continued followup by CT chest in 1- 2 years is recommended. There is also vague ground-glass opacity in the periphery of the left lower lobe on axial image 37 of questionable significance, not definitely seen previously. No solid lung nodule is seen and no pleural effusion is noted. The central airway is patent. Coronary artery calcifications again are noted involving all 3 vessels.  On soft tissue window images, no mediastinal or hilar adenopathy is seen on this unenhanced study. What is seen of the upper abdomen is unremarkable. There are degenerative changes in the  mid to lower thoracic spine  IMPRESSION: 1. Some increase in size of the ground-glass opacity which may be partially solid within the periphery of the  superior segment of the right lower lobe when compared to CT dating 12/29/2011. 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 2. Diffuse coronary artery calcifications.   Electronically Signed  By: Ivar Drape M.D.  On: 11/05/2014 16:28  Pulmonary function testing 11/26/14 FVC 2.67 (65%) FEV1 1.87 (60%) Walked 180 feet 3 without shortness of breath or desaturation at Dr. Gustavus Bryant office  Impression: Victor Davis is a 74 year old gentleman with a remote history of tobacco abuse who has a groundglass nodule in the superior segment of the right lower lobe. This is increased in size from 8 mm to 12 mm over a 3 year period and from 9 to 12 mm in the past 18 months. I reviewed the CT scans personally and also reviewed them with Victor Davis.   I discussed the differential diagnosis for the nodule as well as potential diagnostic and treatment algorithms. 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 They understand that the only way to definitively prove otherwise would be to do an excisional biopsy so the entire lesion has been removed. We discussed the option of needle biopsy followed by radiation versus excisional biopsy with video-assisted thoracoscopy. My recommendation was that we do video-assisted thoracoscopy for definitive diagnosis and treatment with a right lower lobe superior segmentectomy.  I discussed the general nature of the procedure with Victor Davis. We discussed the need for general anesthesia, the incisions to be used, the expected hospital stay, and overall recovery. I discussed the indications, risks, benefits, and alternatives. He understands that the risks include, but are not limited to death, MI, DVT, PE, bleeding, possible need for transfusion, infection, stroke, prolonged air leak, irregular heart rhythms, as well as the possibility  of unforeseeable complications. He is relatively high risk given his history of coronary disease, COPD, and prior CVA. They understand that that this is indeed a low-grade adenocarcinoma or adenocarcinoma in situ that the chances of cure are excellent.  They are currently dealing with some pretty serious health problems with Mrs. Duman. She recently had back surgery and has been diagnosed with breast cancer. She is having surgery for her breast cancer in early May and then says she will be having chemotherapy. Victor Davis would like to delay intervention on the lung nodule for 3-4 months so that they can get his wife's recovery well underway before he has a procedure. Given the slow growth rate for the nodule I think this is reasonable and the risk of doing so is extremely low.    Plan: Return in 3 months to discuss scheduling of lung resection. Since it will have been 4 months from his most recent scan we'll go ahead and repeat his CT at that time.`  Melrose Nakayama, MD [facility] [phone]

## 2014-12-27 ENCOUNTER — Other Ambulatory Visit: Payer: Self-pay | Admitting: Cardiovascular Disease

## 2015-01-16 ENCOUNTER — Other Ambulatory Visit: Payer: Self-pay | Admitting: Cardiovascular Disease

## 2015-01-17 ENCOUNTER — Emergency Department (HOSPITAL_COMMUNITY): Payer: Medicare Other

## 2015-01-17 ENCOUNTER — Inpatient Hospital Stay (HOSPITAL_COMMUNITY)
Admission: EM | Admit: 2015-01-17 | Discharge: 2015-01-21 | DRG: 409 | Disposition: A | Discharge status: Skilled Nursing Facility | Payer: Medicare Other | Attending: Internal Medicine | Admitting: Internal Medicine

## 2015-01-17 ENCOUNTER — Encounter (HOSPITAL_COMMUNITY): Payer: Self-pay | Admitting: *Deleted

## 2015-01-17 DIAGNOSIS — I739 Peripheral vascular disease, unspecified: Secondary | ICD-10-CM | POA: Diagnosis present

## 2015-01-17 DIAGNOSIS — G2581 Restless legs syndrome: Secondary | ICD-10-CM | POA: Diagnosis not present

## 2015-01-17 DIAGNOSIS — R112 Nausea with vomiting, unspecified: Secondary | ICD-10-CM | POA: Diagnosis not present

## 2015-01-17 DIAGNOSIS — I129 Hypertensive chronic kidney disease with stage 1 through stage 4 chronic kidney disease, or unspecified chronic kidney disease: Secondary | ICD-10-CM | POA: Diagnosis not present

## 2015-01-17 DIAGNOSIS — J449 Chronic obstructive pulmonary disease, unspecified: Secondary | ICD-10-CM | POA: Diagnosis present

## 2015-01-17 DIAGNOSIS — K810 Acute cholecystitis: Secondary | ICD-10-CM | POA: Diagnosis not present

## 2015-01-17 DIAGNOSIS — Z7902 Long term (current) use of antithrombotics/antiplatelets: Secondary | ICD-10-CM

## 2015-01-17 DIAGNOSIS — Z955 Presence of coronary angioplasty implant and graft: Secondary | ICD-10-CM | POA: Diagnosis not present

## 2015-01-17 DIAGNOSIS — E860 Dehydration: Secondary | ICD-10-CM | POA: Diagnosis not present

## 2015-01-17 DIAGNOSIS — I2510 Atherosclerotic heart disease of native coronary artery without angina pectoris: Secondary | ICD-10-CM | POA: Diagnosis present

## 2015-01-17 DIAGNOSIS — Z87891 Personal history of nicotine dependence: Secondary | ICD-10-CM

## 2015-01-17 DIAGNOSIS — N179 Acute kidney failure, unspecified: Secondary | ICD-10-CM | POA: Diagnosis present

## 2015-01-17 DIAGNOSIS — Z8546 Personal history of malignant neoplasm of prostate: Secondary | ICD-10-CM

## 2015-01-17 DIAGNOSIS — K819 Cholecystitis, unspecified: Secondary | ICD-10-CM | POA: Diagnosis not present

## 2015-01-17 DIAGNOSIS — I10 Essential (primary) hypertension: Secondary | ICD-10-CM | POA: Diagnosis not present

## 2015-01-17 DIAGNOSIS — R109 Unspecified abdominal pain: Secondary | ICD-10-CM | POA: Diagnosis present

## 2015-01-17 DIAGNOSIS — Z888 Allergy status to other drugs, medicaments and biological substances status: Secondary | ICD-10-CM | POA: Diagnosis not present

## 2015-01-17 DIAGNOSIS — Z8249 Family history of ischemic heart disease and other diseases of the circulatory system: Secondary | ICD-10-CM | POA: Diagnosis not present

## 2015-01-17 DIAGNOSIS — J439 Emphysema, unspecified: Secondary | ICD-10-CM | POA: Diagnosis not present

## 2015-01-17 DIAGNOSIS — Z96643 Presence of artificial hip joint, bilateral: Secondary | ICD-10-CM | POA: Diagnosis present

## 2015-01-17 DIAGNOSIS — K210 Gastro-esophageal reflux disease with esophagitis: Secondary | ICD-10-CM | POA: Diagnosis not present

## 2015-01-17 DIAGNOSIS — N189 Chronic kidney disease, unspecified: Secondary | ICD-10-CM | POA: Diagnosis not present

## 2015-01-17 DIAGNOSIS — Z7982 Long term (current) use of aspirin: Secondary | ICD-10-CM

## 2015-01-17 DIAGNOSIS — E785 Hyperlipidemia, unspecified: Secondary | ICD-10-CM | POA: Diagnosis not present

## 2015-01-17 DIAGNOSIS — R1011 Right upper quadrant pain: Secondary | ICD-10-CM | POA: Diagnosis not present

## 2015-01-17 DIAGNOSIS — Z79899 Other long term (current) drug therapy: Secondary | ICD-10-CM

## 2015-01-17 DIAGNOSIS — M1990 Unspecified osteoarthritis, unspecified site: Secondary | ICD-10-CM | POA: Diagnosis present

## 2015-01-17 DIAGNOSIS — N182 Chronic kidney disease, stage 2 (mild): Secondary | ICD-10-CM | POA: Diagnosis present

## 2015-01-17 DIAGNOSIS — Z8673 Personal history of transient ischemic attack (TIA), and cerebral infarction without residual deficits: Secondary | ICD-10-CM | POA: Diagnosis not present

## 2015-01-17 DIAGNOSIS — R1031 Right lower quadrant pain: Secondary | ICD-10-CM | POA: Diagnosis not present

## 2015-01-17 LAB — COMPREHENSIVE METABOLIC PANEL
ALBUMIN: 3.9 g/dL (ref 3.5–5.0)
ALK PHOS: 87 U/L (ref 38–126)
ALT: 24 U/L (ref 17–63)
ANION GAP: 13 (ref 5–15)
AST: 27 U/L (ref 15–41)
BUN: 24 mg/dL — ABNORMAL HIGH (ref 6–20)
CHLORIDE: 104 mmol/L (ref 101–111)
CO2: 24 mmol/L (ref 22–32)
Calcium: 9.4 mg/dL (ref 8.9–10.3)
Creatinine, Ser: 1.93 mg/dL — ABNORMAL HIGH (ref 0.61–1.24)
GFR calc Af Amer: 37 mL/min — ABNORMAL LOW (ref >60)
GFR, EST NON AFRICAN AMERICAN: 32 mL/min — AB (ref >60)
Glucose, Bld: 147 mg/dL — ABNORMAL HIGH (ref 65–99)
POTASSIUM: 3.9 mmol/L (ref 3.5–5.1)
Sodium: 141 mmol/L (ref 135–145)
TOTAL PROTEIN: 7.1 g/dL (ref 6.5–8.1)
Total Bilirubin: 0.6 mg/dL (ref 0.3–1.2)

## 2015-01-17 LAB — URINALYSIS, ROUTINE W REFLEX MICROSCOPIC
Bilirubin Urine: NEGATIVE
GLUCOSE, UA: NEGATIVE mg/dL
HGB URINE DIPSTICK: NEGATIVE
Ketones, ur: NEGATIVE mg/dL
LEUKOCYTES UA: NEGATIVE
NITRITE: NEGATIVE
Protein, ur: 100 mg/dL — AB
Specific Gravity, Urine: 1.017 (ref 1.005–1.030)
Urobilinogen, UA: 0.2 mg/dL (ref 0.0–1.0)
pH: 5 (ref 5.0–8.0)

## 2015-01-17 LAB — CBC WITH DIFFERENTIAL/PLATELET
BASOS ABS: 0 10*3/uL (ref 0.0–0.1)
BASOS PCT: 0 % (ref 0–1)
EOS ABS: 0.2 10*3/uL (ref 0.0–0.7)
Eosinophils Relative: 1 % (ref 0–5)
HCT: 39.7 % (ref 39.0–52.0)
HEMOGLOBIN: 13.1 g/dL (ref 13.0–17.0)
Lymphocytes Relative: 15 % (ref 12–46)
Lymphs Abs: 1.9 10*3/uL (ref 0.7–4.0)
MCH: 28.9 pg (ref 26.0–34.0)
MCHC: 33 g/dL (ref 30.0–36.0)
MCV: 87.4 fL (ref 78.0–100.0)
MONO ABS: 1 10*3/uL (ref 0.1–1.0)
Monocytes Relative: 8 % (ref 3–12)
NEUTROS PCT: 76 % (ref 43–77)
Neutro Abs: 9.7 10*3/uL — ABNORMAL HIGH (ref 1.7–7.7)
PLATELETS: 162 10*3/uL (ref 150–400)
RBC: 4.54 MIL/uL (ref 4.22–5.81)
RDW: 13.3 % (ref 11.5–15.5)
WBC: 12.8 10*3/uL — AB (ref 4.0–10.5)

## 2015-01-17 LAB — URINE MICROSCOPIC-ADD ON

## 2015-01-17 LAB — LIPASE, BLOOD: LIPASE: 16 U/L — AB (ref 22–51)

## 2015-01-17 LAB — POC OCCULT BLOOD, ED: Fecal Occult Bld: NEGATIVE

## 2015-01-17 MED ORDER — SODIUM CHLORIDE 0.9 % IV BOLUS (SEPSIS)
500.0000 mL | Freq: Once | INTRAVENOUS | Status: AC
  Administered 2015-01-17: 500 mL via INTRAVENOUS

## 2015-01-17 MED ORDER — SODIUM CHLORIDE 0.9 % IV SOLN
INTRAVENOUS | Status: DC
  Administered 2015-01-18 – 2015-01-19 (×3): via INTRAVENOUS

## 2015-01-17 MED ORDER — IOHEXOL 300 MG/ML  SOLN
100.0000 mL | Freq: Once | INTRAMUSCULAR | Status: AC | PRN
  Administered 2015-01-17: 100 mL via INTRAVENOUS

## 2015-01-17 MED ORDER — ONDANSETRON HCL 4 MG/2ML IJ SOLN
4.0000 mg | Freq: Once | INTRAMUSCULAR | Status: AC
  Administered 2015-01-17: 4 mg via INTRAVENOUS
  Filled 2015-01-17: qty 2

## 2015-01-17 MED ORDER — ONDANSETRON 4 MG PO TBDP
ORAL_TABLET | ORAL | Status: AC
  Filled 2015-01-17: qty 2

## 2015-01-17 MED ORDER — IOHEXOL 300 MG/ML  SOLN
25.0000 mL | Freq: Once | INTRAMUSCULAR | Status: AC | PRN
  Administered 2015-01-17: 25 mL via ORAL

## 2015-01-17 MED ORDER — FENTANYL CITRATE (PF) 100 MCG/2ML IJ SOLN
50.0000 ug | INTRAMUSCULAR | Status: DC | PRN
  Administered 2015-01-17 (×3): 50 ug via INTRAVENOUS
  Filled 2015-01-17 (×3): qty 2

## 2015-01-17 MED ORDER — ONDANSETRON 4 MG PO TBDP
8.0000 mg | ORAL_TABLET | Freq: Once | ORAL | Status: AC
  Administered 2015-01-17: 8 mg via ORAL

## 2015-01-17 NOTE — ED Provider Notes (Signed)
CSN: [id number]     Arrival date & time 01/17/15  1823 History   First MD Initiated Contact with Patient 01/17/15 2013     Chief Complaint  Patient presents with  . Emesis  . Abdominal Pain     (Consider location/radiation/quality/duration/timing/severity/associated sxs/prior Treatment) HPI   Victor Davis is a 74 y.o. male who presents for evaluation of abdominal pain, episodes of vomiting, and occasional dark stool for 5 days. He has not had fever, cough, chest pain, weakness or dizziness. He is unable to tolerate any food or fluids because of vomiting. He had similar pain in the past, when he had an ulcer, in the 1970s. He is attempting to take his usual medicine, but having difficulty tolerating it. There are no other known modifying factors.  Past Medical History  Diagnosis Date  . BRADYCARDIA 10/24/2010  . CALF PAIN, RIGHT 10/22/2009  . 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 Dysuria 10/10/2010  . ERECTILE DYSFUNCTION 04/05/2007  . FATIGUE 10/02/2007  . GLUCOSE INTOLERANCE 07/15/2008  . HYPERLIPIDEMIA 07/30/2008  . HYPERSOMNIA 10/02/2007  . INSOMNIA-SLEEP DISORDER-UNSPEC 10/22/2009  . LOW BACK PAIN 04/05/2007  . OSTEOARTHRITIS 04/05/2007  . PEPTIC ULCER DISEASE 04/05/2007  . PERIPHERAL EDEMA 10/22/2009  . PERIPHERAL VASCULAR DISEASE 07/30/2008  . PROSTATE CANCER, HX OF 04/05/2007  . RESTLESS LEG SYNDROME 05/15/2007  . RHINITIS, ALLERGIC NOS 05/15/2007  . SHOULDER PAIN, LEFT 01/22/2008  . SINUSITIS- ACUTE-NOS 05/15/2007  . Unspecified essential hypertension 04/05/2007  . Unspecified visual loss 07/15/2008  . UTI 10/24/2010  . Impaired glucose tolerance 02/09/2011  . Coronary artery disease      95% followed by 80% mid LAD stenosis, circumflex 80% stenosis. There was a moderate size branching obtuse marginal with 90-95% stenosis in the inferior branch. The right coronary artery had proximal 30% stenosis. Posterior lateral was moderate-sized with 40% stenosis. The EF was 65-70%. He had a DES  and  x 2 to the LAD and DES x 2 to the circumflex 03/08/12.    [name] GERD (gastroesophageal reflux disease)   . Emphysema 04/01/2012    By CT chest, august 2013   Past Surgical History  Procedure Laterality Date  . Inguinal herniorrhapy left  2002    x 2  . Rotator cuff repair      left  . Knee surgery      left  . S/p ventral surgery  2009  . Prostatectomy  2002    radical  . Right hip replacement  09/22/09  . Left hip replacement    . Eye surgery      right  . Percutaneous coronary stent intervention (pci-s) N/A 03/08/2012    Procedure: PERCUTANEOUS CORONARY STENT INTERVENTION (PCI-S);  Surgeon: Burnell Blanks, MD;  Location: [facility] CATH LAB;  Service: Cardiovascular;  Laterality: N/A;   Family History  Problem Relation Age of Onset  . Heart attack Father 104    Smoker  . Heart attack Brother 39  . Colon cancer Neg Hx   . Lung cancer Sister    History  Substance Use Topics  . Smoking status: Former Smoker -- 1.00 packs/day for 15 years    Types: Cigarettes    Quit date: 02/05/1977  . Smokeless tobacco: Never Used  . Alcohol Use: 0.5 oz/week    1 drink(s) per week     Comment: RARE    Review of Systems  All other systems reviewed and are negative.     Allergies  Amitriptyline hcl; Diphenhydramine hcl; and Simvastatin  Home Medications   Prior to Admission medications   Medication Sig Start Date End Date Taking? Authorizing Provider  alum & mag hydroxide-simeth (MAALOX/MYLANTA) 200-200-20 MG/5ML suspension Take 30 mLs by mouth every 6 (six) hours as needed for indigestion or heartburn.   Yes Historical Provider, MD  amLODipine (NORVASC) 10 MG tablet Take 1 tablet (10 mg total) by mouth daily. 10/16/14  Yes Biagio Borg, MD  aspirin 81 MG EC tablet Take 81 mg by mouth daily.     Yes Historical Provider, MD  citalopram (CELEXA) 40 MG tablet Take 1 tablet (40 mg total) by mouth daily. 02/03/14  Yes Biagio Borg, MD  cloNIDine (CATAPRES) 0.1 MG tablet Take 1 tablet by  mouth every AM and 2 tablets by mouth every PM 04/21/14  Yes Biagio Borg, MD  clopidogrel (PLAVIX) 75 MG tablet Take 1 tablet (75 mg total) by mouth daily. 10/16/14  Yes Burnell Blanks, MD  ibuprofen (ADVIL,MOTRIN) 200 MG tablet Take 400 mg by mouth every 6 (six) hours as needed. pain   Yes Historical Provider, MD  isosorbide mononitrate (IMDUR) 30 MG 24 hr tablet TAKE 1 TABLET (30 MG TOTAL) BY MOUTH DAILY. 09/10/14  Yes Burnell Blanks, MD  loratadine (CLARITIN) 10 MG tablet Take 10 mg by mouth daily.   Yes Historical Provider, MD  lovastatin (MEVACOR) 40 MG tablet TAKE 1 TABLET BY MOUTH AT BEDTIME 08/05/14  Yes Burnell Blanks, MD  nitroGLYCERIN (NITROSTAT) 0.4 MG SL tablet Place 1 tablet (0.4 mg total) under the tongue every 5 (five) minutes as needed for chest pain. 08/20/14 10/06/15 Yes Burnell Blanks, MD  pantoprazole (PROTONIX) 40 MG tablet Take 1 tablet (40 mg total) by mouth daily. 08/20/14  Yes Burnell Blanks, MD  pramipexole (MIRAPEX) 1 MG tablet TAKE 1 TABLET (1 MG TOTAL) BY MOUTH 3 (THREE) TIMES DAILY. Patient taking differently: TAKE 1 TABLET (1 MG TOTAL) BY MOUTH TWICE DAILY 06/22/14  Yes Biagio Borg, MD  zolpidem (AMBIEN) 5 MG tablet Take 1 tablet (5 mg total) by mouth at bedtime as needed for sleep. Patient taking differently: Take 5 mg by mouth at bedtime.  02/20/12 01/17/15 Yes Biagio Borg, MD  isosorbide mononitrate (IMDUR) 30 MG 24 hr tablet TAKE 1 TABLET (30 MG TOTAL) BY MOUTH DAILY. 12/28/14   Burnell Blanks, MD   BP 160/86 mmHg  Pulse 93  Temp(Src) 97.8 F (36.6 C) (Oral)  Resp 16  SpO2 96% Physical Exam  Constitutional: He is oriented to person, place, and time. He appears well-developed and well-nourished.  HENT:  Head: Normocephalic and atraumatic.  Right Ear: External ear normal.  Left Ear: External ear normal.  Eyes: Conjunctivae and EOM are normal. Pupils are equal, round, and reactive to light.  Neck: Normal range of motion  and phonation normal. Neck supple.  Cardiovascular: Normal rate, regular rhythm and normal heart sounds.   Pulmonary/Chest: Effort normal and breath sounds normal. He exhibits no bony tenderness.  Abdominal: Soft. He exhibits no mass. There is tenderness (right upper quadrant, moderate). There is no rebound and no guarding.  Patient vomiting yellow mucus in the emergency department.  Musculoskeletal: Normal range of motion.  Neurological: He is alert and oriented to person, place, and time. No cranial nerve deficit or sensory deficit. He exhibits normal muscle tone. Coordination normal.  Skin: Skin is warm, dry and intact.  Psychiatric: He has a normal mood and affect. His behavior is normal. Judgment and thought content  normal.  Nursing note and vitals reviewed.   ED Course  Procedures (including critical care time)  Medications  fentaNYL (SUBLIMAZE) injection 50 mcg (50 mcg Intravenous Given 01/17/15 2341)  0.9 %  sodium chloride infusion (not administered)  ondansetron (ZOFRAN-ODT) disintegrating tablet 8 mg (8 mg Oral Given 01/17/15 1837)  ondansetron (ZOFRAN) injection 4 mg (4 mg Intravenous Given 01/17/15 2115)  iohexol (OMNIPAQUE) 300 MG/ML solution 25 mL (25 mLs Oral Contrast Given 01/17/15 2143)  sodium chloride 0.9 % bolus 500 mL (0 mLs Intravenous Stopped 01/17/15 2340)  iohexol (OMNIPAQUE) 300 MG/ML solution 100 mL (100 mLs Intravenous Contrast Given 01/17/15 2237)    Patient Vitals for the past 24 hrs:  BP Temp Temp src Pulse Resp SpO2  01/17/15 2215 160/86 mmHg - - 93 16 96 %  01/17/15 2200 146/80 mmHg - - 93 - 97 %  01/17/15 2130 132/76 mmHg - - 93 17 92 %  01/17/15 2100 157/78 mmHg 97.8 F (36.6 C) Oral 93 18 94 %  01/17/15 2030 149/78 mmHg - - 79 18 96 %  01/17/15 2019 159/77 mmHg 97.7 F (36.5 C) Oral 83 14 95 %  01/17/15 1828 134/72 mmHg 98 F (36.7 C) Oral 88 18 93 %    11:51 PM Reevaluation with update and discussion. After initial assessment and treatment, an updated  evaluation reveals patient's vomiting control. Has intermittent pain that improves when receiving fentanyl. Patient family updated on findings in the treatment plan which will include admission. Daleen Bo L   00:00 AM-Consult complete with Hospitalist. Patient case explained and discussed. He agrees to admit patient for further evaluation and treatment. He was surgical consultation to evaluate the patient for cholecystitis. Call ended at 00:40  Labs Review Labs Reviewed  CBC WITH DIFFERENTIAL/PLATELET - Abnormal; Notable for the following:    WBC 12.8 (*)    Neutro Abs 9.7 (*)    All other components within normal limits  COMPREHENSIVE METABOLIC PANEL - Abnormal; Notable for the following:    Glucose, Bld 147 (*)    BUN 24 (*)    Creatinine, Ser 1.93 (*)    GFR calc non Af Amer 32 (*)    GFR calc Af Amer 37 (*)    All other components within normal limits  LIPASE, BLOOD - Abnormal; Notable for the following:    Lipase 16 (*)    All other components within normal limits  URINALYSIS, ROUTINE W REFLEX MICROSCOPIC (NOT AT [facility]) - Abnormal; Notable for the following:    APPearance CLOUDY (*)    Protein, ur 100 (*)    All other components within normal limits  URINE MICROSCOPIC-ADD ON  POC OCCULT BLOOD, ED    BUN  Date Value Ref Range Status  01/17/2015 24* 6 - 20 mg/dL Final  10/20/2014 26* 6 - 23 mg/dL Final  04/21/2014 18 6 - 23 mg/dL Final  12/27/2012 15 6 - 23 mg/dL Final   CREATININE, SER  Date Value Ref Range Status  01/17/2015 1.93* 0.61 - 1.24 mg/dL Final  10/20/2014 1.59* 0.40 - 1.50 mg/dL Final  04/21/2014 1.3 0.4 - 1.5 mg/dL Final  12/27/2012 1.3 0.4 - 1.5 mg/dL Final      Imaging Review Ct Abdomen Pelvis W Contrast  01/17/2015   CLINICAL DATA:  74 year old male with right-sided abdominal pain for 4-5 days. Emesis.  EXAM: CT ABDOMEN AND PELVIS WITH CONTRAST  TECHNIQUE: Multidetector CT imaging of the abdomen and pelvis was performed using the standard  protocol following  bolus administration of intravenous contrast.  CONTRAST:  132m OMNIPAQUE IOHEXOL 300 MG/ML  SOLN  COMPARISON:  CT 04/08/2013  FINDINGS: Mild right greater than left basilar atelectasis. Dense coronary artery calcifications versus coronary stent. No pleural effusion.  The gallbladder is distended, there is question of mild pericholecystic soft tissue stranding. No calcified gallstones. There is prominence of the common bile duct measuring 10 mm at the porta hepatis. No focal hepatic lesion. The spleen, adrenal glands, and pancreas are unremarkable. No pancreatic ductal dilatation.  Complex cystic lesion in the lower pole the left kidney appears slightly decreased in size from prior exam, currently 3.4 x 3.4 cm, previously 4.3 x 4.8 cm. Thin peripheral enhancement again suspected medially. Additional scattered simple cysts throughout both kidneys are unchanged. There is no hydronephrosis. There is symmetric enhancement and excretion on delayed phase imaging.  Moderate hiatal hernia. Stomach is physiologically distended. There are no dilated or thickened bowel loops. The appendix is normal. There is moderate stool throughout the colon with multifocal colonic diverticula, most significant in the distal colon. No acute diverticulitis. No colonic wall thickening. No free air, free fluid, or intra-abdominal fluid collection.  No retroperitoneal adenopathy. Abdominal aorta is normal in caliber. Moderate to dense atherosclerosis of the abdominal aorta without aneurysm.  Within the pelvis the urinary bladder is physiologically distended. Cheek artifact from bilateral hip prostheses limits pelvic evaluation. Prostate gland is not evaluated, patient is post prostatectomy by report. Surgical clips in the bilateral external iliac stations.  Postsurgical change of the anterior abdominal wall with multiple tacks.  There are no acute or suspicious osseous abnormalities. There is multilevel degenerative change  throughout the lumbar spine with both degenerative disc disease and facet arthropathy.  IMPRESSION: 1. Distended gallbladder with question of adjacent inflammatory change. Prominence of the common bile duct measuring 10 mm. Findings are concerning for acute cholecystitis and possible choledocholithiasis. Right upper quadrant ultrasound could be considered for further evaluation. 2. Extensive diverticulosis without acute diverticulitis. 3. Decreased size of complex left renal cyst from prior exam. Decreasing size suggests benign etiology.   Electronically Signed   By: MJeb LeveringM.D.   On: 01/17/2015 23:38     EKG Interpretation None      MDM   Final diagnoses:  Right upper quadrant pain  Nausea and vomiting, vomiting of unspecified type  AKI (acute kidney injury)    Abdominal pain with abnormal CT. Possible inflammatory process associated with gallbladder. No elevation of transaminases, or lipase. No evidence for active bleeding. Creatinine, BUN higher than baseline, indicating dehydration. Additional imaging required, ultrasound. Patient will likely need admission for ongoing management of nausea and vomiting.  Nursing Notes Reviewed/ Care Coordinated, and agree without changes. Applicable Imaging Reviewed.  Interpretation of Laboratory Data incorporated into ED treatment   Plan: Admit  EDaleen Bo MD 01/18/15 [phone]

## 2015-01-17 NOTE — ED Notes (Signed)
Lab called to inquire about pt's blood work results.

## 2015-01-17 NOTE — ED Notes (Signed)
Pt states that he has had vomiting abdominal pain, and dark stools for 5 days. Pt reports taking blood thinners.

## 2015-01-18 ENCOUNTER — Encounter (HOSPITAL_COMMUNITY): Payer: Self-pay | Admitting: Internal Medicine

## 2015-01-18 ENCOUNTER — Inpatient Hospital Stay (HOSPITAL_COMMUNITY): Payer: Medicare Other

## 2015-01-18 DIAGNOSIS — Z434 Encounter for attention to other artificial openings of digestive tract: Secondary | ICD-10-CM | POA: Diagnosis not present

## 2015-01-18 DIAGNOSIS — N179 Acute kidney failure, unspecified: Secondary | ICD-10-CM | POA: Diagnosis present

## 2015-01-18 DIAGNOSIS — I2510 Atherosclerotic heart disease of native coronary artery without angina pectoris: Secondary | ICD-10-CM | POA: Diagnosis not present

## 2015-01-18 DIAGNOSIS — N182 Chronic kidney disease, stage 2 (mild): Secondary | ICD-10-CM | POA: Diagnosis present

## 2015-01-18 DIAGNOSIS — R1011 Right upper quadrant pain: Secondary | ICD-10-CM | POA: Diagnosis not present

## 2015-01-18 DIAGNOSIS — I739 Peripheral vascular disease, unspecified: Secondary | ICD-10-CM | POA: Diagnosis not present

## 2015-01-18 DIAGNOSIS — M6281 Muscle weakness (generalized): Secondary | ICD-10-CM | POA: Diagnosis not present

## 2015-01-18 DIAGNOSIS — G2581 Restless legs syndrome: Secondary | ICD-10-CM | POA: Diagnosis not present

## 2015-01-18 DIAGNOSIS — J439 Emphysema, unspecified: Secondary | ICD-10-CM | POA: Diagnosis not present

## 2015-01-18 DIAGNOSIS — Z888 Allergy status to other drugs, medicaments and biological substances status: Secondary | ICD-10-CM | POA: Diagnosis not present

## 2015-01-18 DIAGNOSIS — E785 Hyperlipidemia, unspecified: Secondary | ICD-10-CM | POA: Diagnosis not present

## 2015-01-18 DIAGNOSIS — K210 Gastro-esophageal reflux disease with esophagitis: Secondary | ICD-10-CM | POA: Diagnosis present

## 2015-01-18 DIAGNOSIS — Z8249 Family history of ischemic heart disease and other diseases of the circulatory system: Secondary | ICD-10-CM | POA: Diagnosis not present

## 2015-01-18 DIAGNOSIS — K819 Cholecystitis, unspecified: Secondary | ICD-10-CM | POA: Diagnosis present

## 2015-01-18 DIAGNOSIS — E860 Dehydration: Secondary | ICD-10-CM | POA: Diagnosis present

## 2015-01-18 DIAGNOSIS — Z955 Presence of coronary angioplasty implant and graft: Secondary | ICD-10-CM | POA: Diagnosis not present

## 2015-01-18 DIAGNOSIS — I129 Hypertensive chronic kidney disease with stage 1 through stage 4 chronic kidney disease, or unspecified chronic kidney disease: Secondary | ICD-10-CM | POA: Diagnosis present

## 2015-01-18 DIAGNOSIS — Z7982 Long term (current) use of aspirin: Secondary | ICD-10-CM | POA: Diagnosis not present

## 2015-01-18 DIAGNOSIS — K219 Gastro-esophageal reflux disease without esophagitis: Secondary | ICD-10-CM | POA: Diagnosis not present

## 2015-01-18 DIAGNOSIS — I10 Essential (primary) hypertension: Secondary | ICD-10-CM | POA: Diagnosis not present

## 2015-01-18 DIAGNOSIS — N189 Chronic kidney disease, unspecified: Secondary | ICD-10-CM | POA: Diagnosis not present

## 2015-01-18 DIAGNOSIS — Z87891 Personal history of nicotine dependence: Secondary | ICD-10-CM | POA: Diagnosis not present

## 2015-01-18 DIAGNOSIS — Z7902 Long term (current) use of antithrombotics/antiplatelets: Secondary | ICD-10-CM | POA: Diagnosis not present

## 2015-01-18 DIAGNOSIS — Z7901 Long term (current) use of anticoagulants: Secondary | ICD-10-CM | POA: Diagnosis not present

## 2015-01-18 DIAGNOSIS — Z8673 Personal history of transient ischemic attack (TIA), and cerebral infarction without residual deficits: Secondary | ICD-10-CM | POA: Diagnosis not present

## 2015-01-18 DIAGNOSIS — R1031 Right lower quadrant pain: Secondary | ICD-10-CM | POA: Diagnosis not present

## 2015-01-18 DIAGNOSIS — Z8546 Personal history of malignant neoplasm of prostate: Secondary | ICD-10-CM | POA: Diagnosis not present

## 2015-01-18 DIAGNOSIS — K810 Acute cholecystitis: Secondary | ICD-10-CM | POA: Diagnosis not present

## 2015-01-18 DIAGNOSIS — J449 Chronic obstructive pulmonary disease, unspecified: Secondary | ICD-10-CM | POA: Diagnosis present

## 2015-01-18 DIAGNOSIS — Z79899 Other long term (current) drug therapy: Secondary | ICD-10-CM | POA: Diagnosis not present

## 2015-01-18 DIAGNOSIS — Z96643 Presence of artificial hip joint, bilateral: Secondary | ICD-10-CM | POA: Diagnosis present

## 2015-01-18 DIAGNOSIS — M1990 Unspecified osteoarthritis, unspecified site: Secondary | ICD-10-CM | POA: Diagnosis not present

## 2015-01-18 DIAGNOSIS — R109 Unspecified abdominal pain: Secondary | ICD-10-CM | POA: Diagnosis present

## 2015-01-18 LAB — COMPREHENSIVE METABOLIC PANEL
ALBUMIN: 3.7 g/dL (ref 3.5–5.0)
ALK PHOS: 78 U/L (ref 38–126)
ALT: 22 U/L (ref 17–63)
AST: 26 U/L (ref 15–41)
Anion gap: 10 (ref 5–15)
BUN: 20 mg/dL (ref 6–20)
CHLORIDE: 105 mmol/L (ref 101–111)
CO2: 26 mmol/L (ref 22–32)
CREATININE: 1.53 mg/dL — AB (ref 0.61–1.24)
Calcium: 8.8 mg/dL — ABNORMAL LOW (ref 8.9–10.3)
GFR calc Af Amer: 50 mL/min — ABNORMAL LOW (ref >60)
GFR calc non Af Amer: 43 mL/min — ABNORMAL LOW (ref >60)
Glucose, Bld: 174 mg/dL — ABNORMAL HIGH (ref 65–99)
POTASSIUM: 3.9 mmol/L (ref 3.5–5.1)
Sodium: 141 mmol/L (ref 135–145)
Total Bilirubin: 1.1 mg/dL (ref 0.3–1.2)
Total Protein: 6.5 g/dL (ref 6.5–8.1)

## 2015-01-18 LAB — LACTIC ACID, PLASMA
LACTIC ACID, VENOUS: 1.3 mmol/L (ref 0.5–2.0)
Lactic Acid, Venous: 1.1 mmol/L (ref 0.5–2.0)

## 2015-01-18 LAB — CBC
HEMATOCRIT: 37.7 % — AB (ref 39.0–52.0)
Hemoglobin: 12.3 g/dL — ABNORMAL LOW (ref 13.0–17.0)
MCH: 28.4 pg (ref 26.0–34.0)
MCHC: 32.6 g/dL (ref 30.0–36.0)
MCV: 87.1 fL (ref 78.0–100.0)
Platelets: 134 10*3/uL — ABNORMAL LOW (ref 150–400)
RBC: 4.33 MIL/uL (ref 4.22–5.81)
RDW: 13.1 % (ref 11.5–15.5)
WBC: 12.3 10*3/uL — ABNORMAL HIGH (ref 4.0–10.5)

## 2015-01-18 LAB — CREATININE, URINE, RANDOM: Creatinine, Urine: 132.5 mg/dL

## 2015-01-18 LAB — TYPE AND SCREEN
ABO/RH(D): A NEG
Antibody Screen: NEGATIVE

## 2015-01-18 LAB — PROTIME-INR
INR: 1.01 (ref 0.00–1.49)
PROTHROMBIN TIME: 13.5 s (ref 11.6–15.2)

## 2015-01-18 LAB — PROCALCITONIN: PROCALCITONIN: 0.12 ng/mL

## 2015-01-18 LAB — GLUCOSE, CAPILLARY: Glucose-Capillary: 157 mg/dL — ABNORMAL HIGH (ref 65–99)

## 2015-01-18 LAB — APTT: aPTT: 27 seconds (ref 24–37)

## 2015-01-18 MED ORDER — PIPERACILLIN-TAZOBACTAM 3.375 G IVPB 30 MIN
3.3750 g | Freq: Once | INTRAVENOUS | Status: AC
  Administered 2015-01-18: 3.375 g via INTRAVENOUS
  Filled 2015-01-18: qty 50

## 2015-01-18 MED ORDER — PIPERACILLIN-TAZOBACTAM 3.375 G IVPB
3.3750 g | Freq: Three times a day (TID) | INTRAVENOUS | Status: DC
  Administered 2015-01-18 – 2015-01-20 (×7): 3.375 g via INTRAVENOUS
  Filled 2015-01-18 (×11): qty 50

## 2015-01-18 MED ORDER — HYDRALAZINE HCL 20 MG/ML IJ SOLN
5.0000 mg | INTRAMUSCULAR | Status: DC | PRN
  Administered 2015-01-18: 5 mg via INTRAVENOUS
  Filled 2015-01-18: qty 1

## 2015-01-18 MED ORDER — PANTOPRAZOLE SODIUM 40 MG IV SOLR
40.0000 mg | INTRAVENOUS | Status: DC
  Administered 2015-01-18 – 2015-01-21 (×4): 40 mg via INTRAVENOUS
  Filled 2015-01-18 (×5): qty 40

## 2015-01-18 MED ORDER — MIDAZOLAM HCL 2 MG/2ML IJ SOLN
INTRAMUSCULAR | Status: AC
  Filled 2015-01-18: qty 2

## 2015-01-18 MED ORDER — SODIUM CHLORIDE 0.9 % IV BOLUS (SEPSIS)
1000.0000 mL | Freq: Once | INTRAVENOUS | Status: AC
  Administered 2015-01-18: 1000 mL via INTRAVENOUS

## 2015-01-18 MED ORDER — AMLODIPINE BESYLATE 10 MG PO TABS
10.0000 mg | ORAL_TABLET | Freq: Every day | ORAL | Status: DC
  Administered 2015-01-18 – 2015-01-21 (×4): 10 mg via ORAL
  Filled 2015-01-18 (×4): qty 1

## 2015-01-18 MED ORDER — ISOSORBIDE MONONITRATE ER 30 MG PO TB24
30.0000 mg | ORAL_TABLET | Freq: Every day | ORAL | Status: DC
  Administered 2015-01-18 – 2015-01-21 (×4): 30 mg via ORAL
  Filled 2015-01-18 (×4): qty 1

## 2015-01-18 MED ORDER — SODIUM CHLORIDE 0.9 % IJ SOLN
3.0000 mL | Freq: Two times a day (BID) | INTRAMUSCULAR | Status: DC
  Administered 2015-01-18 – 2015-01-20 (×4): 3 mL via INTRAVENOUS

## 2015-01-18 MED ORDER — FENTANYL CITRATE (PF) 100 MCG/2ML IJ SOLN
INTRAMUSCULAR | Status: AC
  Filled 2015-01-18: qty 2

## 2015-01-18 MED ORDER — PRAMIPEXOLE DIHYDROCHLORIDE 1 MG PO TABS
1.0000 mg | ORAL_TABLET | Freq: Three times a day (TID) | ORAL | Status: DC
  Administered 2015-01-18 – 2015-01-21 (×8): 1 mg via ORAL
  Filled 2015-01-18 (×12): qty 1

## 2015-01-18 MED ORDER — LIDOCAINE HCL 1 % IJ SOLN
INTRAMUSCULAR | Status: AC
  Filled 2015-01-18: qty 20

## 2015-01-18 MED ORDER — CITALOPRAM HYDROBROMIDE 20 MG PO TABS
20.0000 mg | ORAL_TABLET | Freq: Every day | ORAL | Status: DC
  Administered 2015-01-18 – 2015-01-21 (×4): 20 mg via ORAL
  Filled 2015-01-18 (×4): qty 1

## 2015-01-18 MED ORDER — ONDANSETRON HCL 4 MG/2ML IJ SOLN
4.0000 mg | Freq: Three times a day (TID) | INTRAMUSCULAR | Status: DC | PRN
  Administered 2015-01-18: 4 mg via INTRAVENOUS
  Filled 2015-01-18: qty 2

## 2015-01-18 MED ORDER — ACETAMINOPHEN 325 MG PO TABS
650.0000 mg | ORAL_TABLET | Freq: Four times a day (QID) | ORAL | Status: DC | PRN
Start: 2015-01-18 — End: 2015-01-21
  Administered 2015-01-20: 650 mg via ORAL
  Filled 2015-01-18: qty 2

## 2015-01-18 MED ORDER — ACETAMINOPHEN 650 MG RE SUPP: 650.0000 mg | Freq: Four times a day (QID) | RECTAL | Status: DC | PRN

## 2015-01-18 MED ORDER — CITALOPRAM HYDROBROMIDE 40 MG PO TABS
40.0000 mg | ORAL_TABLET | Freq: Every day | ORAL | Status: DC
  Filled 2015-01-18: qty 1

## 2015-01-18 MED ORDER — MIDAZOLAM HCL 2 MG/2ML IJ SOLN
INTRAMUSCULAR | Status: AC | PRN
  Administered 2015-01-18: 1 mg via INTRAVENOUS

## 2015-01-18 MED ORDER — LORATADINE 10 MG PO TABS
10.0000 mg | ORAL_TABLET | Freq: Every day | ORAL | Status: DC
  Administered 2015-01-18 – 2015-01-21 (×4): 10 mg via ORAL
  Filled 2015-01-18 (×4): qty 1

## 2015-01-18 MED ORDER — MORPHINE SULFATE 2 MG/ML IJ SOLN
2.0000 mg | INTRAMUSCULAR | Status: DC | PRN
  Administered 2015-01-18: 2 mg via INTRAVENOUS
  Filled 2015-01-18: qty 1

## 2015-01-18 MED ORDER — ASPIRIN EC 81 MG PO TBEC
81.0000 mg | DELAYED_RELEASE_TABLET | Freq: Every day | ORAL | Status: DC
  Administered 2015-01-18 – 2015-01-21 (×4): 81 mg via ORAL
  Filled 2015-01-18 (×4): qty 1

## 2015-01-18 MED ORDER — FENTANYL CITRATE (PF) 100 MCG/2ML IJ SOLN
INTRAMUSCULAR | Status: AC | PRN
  Administered 2015-01-18: 50 ug via INTRAVENOUS

## 2015-01-18 MED ORDER — NITROGLYCERIN 0.4 MG SL SUBL: 0.4000 mg | SUBLINGUAL_TABLET | SUBLINGUAL | Status: DC | PRN

## 2015-01-18 MED ORDER — CLONIDINE HCL 0.1 MG PO TABS
0.1000 mg | ORAL_TABLET | Freq: Two times a day (BID) | ORAL | Status: DC
  Administered 2015-01-18 – 2015-01-21 (×8): 0.1 mg via ORAL
  Filled 2015-01-18 (×9): qty 1

## 2015-01-18 MED ORDER — ZOLPIDEM TARTRATE 5 MG PO TABS
5.0000 mg | ORAL_TABLET | Freq: Every day | ORAL | Status: DC
  Administered 2015-01-20: 5 mg via ORAL
  Filled 2015-01-18: qty 1

## 2015-01-18 NOTE — Progress Notes (Signed)
Patient seen and examined this morning  - ongoing abdominal pain, nausea - he is thirsty   Acute acalculous cholecystitis - general surgery consulted, recommending percutaneous cholecystostomy for now followed by cholecystectomy in few weeks - IR consulted, to have drain placed today  - continue Zosyn  HLD - on statin at home  RLS - Mirapex  CAD - no chest pain, continue aspirin, imdur, plavix now on hold, resume per IR post procedure  HTN - continue Norvasc, clonidine  AKI on CKD - baseline Cr 1.3 - 1.9 on admission >> 1.53 with fluids, continue   Costin M. Cruzita Lederer, MD [facility] [phone]

## 2015-01-18 NOTE — Procedures (Signed)
Placement of percutaneous cholecystostomy tube with [place] and fluoroscopic guidance.  Greater than 100 ml of dark bilious fluid was removed.  No immediate complication.  Minimal blood loss.  Will send fluid for culture.

## 2015-01-18 NOTE — Evaluation (Signed)
Physical Therapy Evaluation Patient Details Name: Victor Davis MRN: [id number] DOB: 19-Nov-1940 Today's Date: 01/18/2015   History of Present Illness  Pt is a 74 y/o male with a PMH with HTN, hyperlipidemia, GERD, peptic ulcer, PVD, prostate CA 2008, ELS, CAD, emphysema. Pt presents with N/V and abdominal pain. He was found to have a distended gallbladder and was admitted for further medical management.   Clinical Impression  Pt admitted with above diagnosis. Pt currently with functional limitations due to the deficits listed below (see PT Problem List). At the time of PT eval pt was able to perform transfers and ambulation with occasional assist for balance support. Unclear what baseline cognition is, or if pt has reliable assistance at home. It appears that the patient has also been caring for his wife at home and assisting her with ADL's. Pt will benefit from skilled PT to increase their independence and safety with mobility to allow discharge to the venue listed below.  If pt has 24 hour assist available at home, feel he would be safe to return to his residence at d/c with HHPT to follow.      Follow Up Recommendations Home health PT;Supervision/Assistance - 24 hour    Equipment Recommendations  Cane (Depending on progress with PT. )    Recommendations for Other Services       Precautions / Restrictions Precautions Precautions: Fall Restrictions Weight Bearing Restrictions: No      Mobility  Bed Mobility Overal bed mobility: Needs Assistance Bed Mobility: Supine to Sit     Supine to sit: Supervision     General bed mobility comments: Supervision for safety as pt transitions to EOB.   Transfers Overall transfer level: Needs assistance Equipment used: None Transfers: Sit to/from Stand Sit to Stand: Min guard         General transfer comment: Steadying assist as pt powers up to full standing position. Pt states "once I'm up and get my balance I'm fine".    Ambulation/Gait Ambulation/Gait assistance: Min assist Ambulation Distance (Feet): 150 Feet Assistive device: None Gait Pattern/deviations: Step-through pattern;Decreased stride length;Trunk flexed Gait velocity: Decreased Gait velocity interpretation: Below normal speed for age/gender General Gait Details: Pt required occasional steadying assist during ambulation. Reports that he does not usually use an AD at home and declines use of the RW.   Stairs            Wheelchair Mobility    Modified Rankin (Stroke Patients Only)       Balance Overall balance assessment: Needs assistance Sitting-balance support: Feet supported;No upper extremity supported Sitting balance-Leahy Scale: Good Sitting balance - Comments: Able to bend over and adjust his socks without loss of balance.   Standing balance support: No upper extremity supported Standing balance-Leahy Scale: Poor Standing balance comment: Occasional support for dynamic standing activity.                              Pertinent Vitals/Pain Pain Assessment: Faces Faces Pain Scale: Hurts little more Pain Location: Abdominal pain Pain Descriptors / Indicators: Tender Pain Intervention(s): Limited activity within patient's tolerance;Monitored during session;Repositioned    Home Living Family/patient expects to be discharged to:: Private residence Living Arrangements: Spouse/significant other Available Help at Discharge: Family;Available PRN/intermittently Type of Home: House       Home Layout: One level Home Equipment: Walker - 2 wheels      Prior Function Level of Independence: Independent  Comments: Pt reports he is independent and assists his wife in ADL's as she is currently ill (?cancer). Pt states he drives, does grocery shopping and cooking at home. Pt also reveals that sister-in-law and children occasionally come by to assist with pt's wife as well.      Hand Dominance         Extremity/Trunk Assessment   Upper Extremity Assessment: Defer to OT evaluation           Lower Extremity Assessment: Overall WFL for tasks assessed      Cervical / Trunk Assessment: Normal  Communication   Communication: Other (comment) (Difficult to understand at times - mumbles)  Cognition Arousal/Alertness: Awake/alert Behavior During Therapy: Restless;Impulsive Overall Cognitive Status: No family/caregiver present to determine baseline cognitive functioning                      General Comments      Exercises        Assessment/Plan    PT Assessment Patient needs continued PT services  PT Diagnosis Difficulty walking   PT Problem List Decreased strength;Decreased range of motion;Decreased activity tolerance;Decreased balance;Decreased mobility;Decreased knowledge of use of DME;Decreased safety awareness;Decreased knowledge of precautions  PT Treatment Interventions DME instruction;Gait training;Stair training;Functional mobility training;Therapeutic activities;Therapeutic exercise;Neuromuscular re-education;Patient/family education   PT Goals (Current goals can be found in the Care Plan section) Acute Rehab PT Goals Patient Stated Goal: Pt did not state goals - only asking for ice chips at this time.  PT Goal Formulation: With patient Time For Goal Achievement: 01/25/15 Potential to Achieve Goals: Good    Frequency Min 3X/week   Barriers to discharge Decreased caregiver support Unclear how much support is available to pt at d/c.     Co-evaluation               End of Session Equipment Utilized During Treatment: Gait belt Activity Tolerance: Patient tolerated treatment well Patient left: in chair;with call bell/phone within reach;with chair alarm set Nurse Communication: Mobility status         Time: 1105-1120 PT Time Calculation (min) (ACUTE ONLY): 15 min   Charges:   PT Evaluation $Initial PT Evaluation Tier I: 1 Procedure     PT  G Codes:        Rolinda Roan January 29, 2015, 12:16 PM   Rolinda Roan, PT, DPT Acute Rehabilitation Services Pager: [phone]

## 2015-01-18 NOTE — H&P (Signed)
[facility] History and Physical  Victor Davis WIO:[id number] DOB: 1940-10-24 DOA: 01/17/2015  Referring physician: ED physician PCP: Cathlean Cower, MD  Specialists:   Chief Complaint: Nausea, vomiting, abdominal pain  HPI: Victor Davis is a 74 y.o. male with PMH of hypertension, hyperlipidemia, GERD, peptic ulcer, peripheral vascular disease, prostate cancer 2008 (s/p of surgery), ELS, CAD, pulmonary emphysema, sleep disorder, who presents with nausea, vomiting and abdominal pain.  Patient reports that he has been having abdominal pain in the past 5 days. The abdominal pain is located in the right side of abdomen, worse on the right upper quadrant. He describes the pain is sharp and moderate to severe. It is aggravated by eating food. It is associated with nausea and vomiting. He vomited more than 5 times yesterday. No diarrhea. He reports that he had one episode of dark stool.  In ED, patient was found to have WBC 12.8, temperature normal, no tachycardia, mild AKI, negative FOBT. CA-abd/pelvis showed distended gallbladder with a dilated bile duct and inflammatory changes.  An abdominal ultrasound confirms a distended gallbladder with a thickened wall and sludge. Liver function tests are normal.   Where does patient live?   At home    Can patient participate in ADLs?   Some   Review of Systems:   General: no fevers, chills, no changes in body weight, has poor appetite, has fatigue HEENT: no blurry vision, hearing changes or sore throat Pulm: no dyspnea, coughing, wheezing CV: no chest pain, palpitations Abd: has nausea, vomiting, abdominal pain, no diarrhea, constipation GU: no dysuria, burning on urination, increased urinary frequency, hematuria  Ext: no leg edema Neuro: no unilateral weakness, numbness, or tingling, no vision change or hearing loss Skin: no rash MSK: No muscle spasm, no deformity, no limitation of range of movement in spin Heme: No easy bruising.  Travel  history: No recent long distant travel.  Allergy:  Allergies  Allergen Reactions  . Amitriptyline Hcl Other (See Comments)    REACTION: confusion  . Diphenhydramine Hcl Other (See Comments)    Keeps him awake  . Simvastatin Other (See Comments)    REACTION: leg pain    Past Medical History  Diagnosis Date  . BRADYCARDIA 10/24/2010  . CALF PAIN, RIGHT 10/22/2009  . 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 Dysuria 10/10/2010  . ERECTILE DYSFUNCTION 04/05/2007  . FATIGUE 10/02/2007  . GLUCOSE INTOLERANCE 07/15/2008  . HYPERLIPIDEMIA 07/30/2008  . HYPERSOMNIA 10/02/2007  . INSOMNIA-SLEEP DISORDER-UNSPEC 10/22/2009  . LOW BACK PAIN 04/05/2007  . OSTEOARTHRITIS 04/05/2007  . PEPTIC ULCER DISEASE 04/05/2007  . PERIPHERAL EDEMA 10/22/2009  . PERIPHERAL VASCULAR DISEASE 07/30/2008  . PROSTATE CANCER, HX OF 04/05/2007  . RESTLESS LEG SYNDROME 05/15/2007  . RHINITIS, ALLERGIC NOS 05/15/2007  . SHOULDER PAIN, LEFT 01/22/2008  . SINUSITIS- ACUTE-NOS 05/15/2007  . Unspecified essential hypertension 04/05/2007  . Unspecified visual loss 07/15/2008  . UTI 10/24/2010  . Impaired glucose tolerance 02/09/2011  . Coronary artery disease      95% followed by 80% mid LAD stenosis, circumflex 80% stenosis. There was a moderate size branching obtuse marginal with 90-95% stenosis in the inferior branch. The right coronary artery had proximal 30% stenosis. Posterior lateral was moderate-sized with 40% stenosis. The EF was 65-70%. He had a DES and  x 2 to the LAD and DES x 2 to the circumflex 03/08/12.    [name] GERD (gastroesophageal reflux disease)   . Emphysema 04/01/2012    By CT chest, august 2013  . CKD (  chronic kidney disease), stage II     Past Surgical History  Procedure Laterality Date  . Inguinal herniorrhapy left  2002    x 2  . Rotator cuff repair      left  . Knee surgery      left  . S/p ventral surgery  2009  . Prostatectomy  2002    radical  . Right hip replacement  09/22/09  . Left hip replacement     . Eye surgery      right  . Percutaneous coronary stent intervention (pci-s) N/A 03/08/2012    Procedure: PERCUTANEOUS CORONARY STENT INTERVENTION (PCI-S);  Surgeon: Burnell Blanks, MD;  Location[facility] CATH LAB;  Service: Cardiovascular;  Laterality: N/A;    Social History:  reports that he quit smoking about 37 years ago. His smoking use included Cigarettes. He has a 15 pack-year smoking history. He has never used smokeless tobacco. He reports that he drinks about 0.5 oz of alcohol per week. He reports that he does not use illicit drugs.  Family History:  Family History  Problem Relation Age of Onset  . Heart attack Father 30    Smoker  . Heart attack Brother 65  . Colon cancer Neg Hx   . Lung cancer Sister      Prior to Admission medications   Medication Sig Start Date End Date Taking? Authorizing Provider  alum & mag hydroxide-simeth (MAALOX/MYLANTA) 200-200-20 MG/5ML suspension Take 30 mLs by mouth every 6 (six) hours as needed for indigestion or heartburn.   Yes Historical Provider, MD  amLODipine (NORVASC) 10 MG tablet Take 1 tablet (10 mg total) by mouth daily. 10/16/14  Yes Biagio Borg, MD  aspirin 81 MG EC tablet Take 81 mg by mouth daily.     Yes Historical Provider, MD  citalopram (CELEXA) 40 MG tablet Take 1 tablet (40 mg total) by mouth daily. 02/03/14  Yes Biagio Borg, MD  cloNIDine (CATAPRES) 0.1 MG tablet Take 1 tablet by mouth every AM and 2 tablets by mouth every PM 04/21/14  Yes Biagio Borg, MD  clopidogrel (PLAVIX) 75 MG tablet Take 1 tablet (75 mg total) by mouth daily. 10/16/14  Yes Burnell Blanks, MD  ibuprofen (ADVIL,MOTRIN) 200 MG tablet Take 400 mg by mouth every 6 (six) hours as needed. pain   Yes Historical Provider, MD  isosorbide mononitrate (IMDUR) 30 MG 24 hr tablet TAKE 1 TABLET (30 MG TOTAL) BY MOUTH DAILY. 09/10/14  Yes Burnell Blanks, MD  loratadine (CLARITIN) 10 MG tablet Take 10 mg by mouth daily.   Yes Historical Provider, MD   lovastatin (MEVACOR) 40 MG tablet TAKE 1 TABLET BY MOUTH AT BEDTIME 08/05/14  Yes Burnell Blanks, MD  nitroGLYCERIN (NITROSTAT) 0.4 MG SL tablet Place 1 tablet (0.4 mg total) under the tongue every 5 (five) minutes as needed for chest pain. 08/20/14 10/06/15 Yes Burnell Blanks, MD  pantoprazole (PROTONIX) 40 MG tablet Take 1 tablet (40 mg total) by mouth daily. 08/20/14  Yes Burnell Blanks, MD  pramipexole (MIRAPEX) 1 MG tablet TAKE 1 TABLET (1 MG TOTAL) BY MOUTH 3 (THREE) TIMES DAILY. Patient taking differently: TAKE 1 TABLET (1 MG TOTAL) BY MOUTH TWICE DAILY 06/22/14  Yes Biagio Borg, MD  zolpidem (AMBIEN) 5 MG tablet Take 1 tablet (5 mg total) by mouth at bedtime as needed for sleep. Patient taking differently: Take 5 mg by mouth at bedtime.  02/20/12 01/17/15 Yes Biagio Borg, MD  isosorbide mononitrate (IMDUR) 30 MG 24 hr tablet TAKE 1 TABLET (30 MG TOTAL) BY MOUTH DAILY. 12/28/14   Burnell Blanks, MD    Physical Exam: Filed Vitals:   01/17/15 2215 01/18/15 0230 01/18/15 0510 01/18/15 0545  BP: 160/86 166/83 181/85 167/81  Pulse: 93 107 106 105  Temp:  98.1 F (36.7 C)    TempSrc:  Oral    Resp: '16 16 16   '$ Height:  '5\' 7"'$  (1.702 m)    Weight:  69.3 kg (152 lb 12.5 oz)    SpO2: 96% 96% 97%    General: Not in acute distress. Dry mucous and membrane.  HEENT:       Eyes: PERRL, EOMI, no scleral icterus.       ENT: No discharge from the ears and nose, no pharynx injection, no tonsillar enlargement.        Neck: No JVD, no bruit, no mass felt. Heme: No neck lymph node enlargement. Cardiac: S1/S2, RRR, No murmurs, No gallops or rubs. Pulm:  No rales, wheezing, rhonchi or rubs. Abd: Soft, nondistended, tenderness over RUQ, no rebound pain, no organomegaly, BS present. Ext: No pitting leg edema bilaterally. 2+DP/PT pulse bilaterally. Musculoskeletal: No joint deformities, No joint redness or warmth, no limitation of ROM in spin. Skin: No rashes.  Neuro: Alert,  oriented X3, cranial nerves II-XII grossly intact, muscle strength 5/5 in all extremities, sensation to light touch intact.  Psych: Patient is not psychotic, no suicidal or hemocidal ideation.  Labs on Admission:  Basic Metabolic Panel:  Recent Labs Lab 01/17/15 1852 01/18/15 0315  NA 141 141  K 3.9 3.9  CL 104 105  CO2 24 26  GLUCOSE 147* 174*  BUN 24* 20  CREATININE 1.93* 1.53*  CALCIUM 9.4 8.8*   Liver Function Tests:  Recent Labs Lab 01/17/15 1852 01/18/15 0315  AST 27 26  ALT 24 22  ALKPHOS 87 78  BILITOT 0.6 1.1  PROT 7.1 6.5  ALBUMIN 3.9 3.7    Recent Labs Lab 01/17/15 1852  LIPASE 16*   No results for input(s): AMMONIA in the last 168 hours. CBC:  Recent Labs Lab 01/17/15 1852 01/18/15 0315  WBC 12.8* 12.3*  NEUTROABS 9.7*  --   HGB 13.1 12.3*  HCT 39.7 37.7*  MCV 87.4 87.1  PLT 162 134*   Cardiac Enzymes: No results for input(s): CKTOTAL, CKMB, CKMBINDEX, TROPONINI in the last 168 hours.  BNP (last 3 results) No results for input(s): BNP in the last 8760 hours.  ProBNP (last 3 results) No results for input(s): PROBNP in the last 8760 hours.  CBG: No results for input(s): GLUCAP in the last 168 hours.  Radiological Exams on Admission: US Abdomen Complete  01/18/2015   CLINICAL DATA:  Patient with right-sided abdominal pain for 5 days.  EXAM: ULTRASOUND ABDOMEN COMPLETE  COMPARISON:  CT scan earlier today.  FINDINGS: Gallbladder: Distended with sludge. Wall thickness slightly greater than 4 mm. Negative sonographic Murphy's sign but the patient is reportedly heavily sedated.  Common bile duct: Diameter: 12 mm maximum, may taper distally to 7-9 mm.  Liver: No focal lesion identified. Within normal limits in parenchymal echogenicity.  IVC: No abnormality visualized.  Pancreas: Visualized portion unremarkable.  Spleen: Size and appearance within normal limits.  Right Kidney: Length: 10 cm. Hypoechoic lower pole cyst 2.3 x 1.5 x 1.9 cm  Left Kidney:  Length: 10.3 cm. Hypoechoic lower pole cyst measuring 3.4 x 2.4 x 3.2 cm  Abdominal aorta: No aneurysm  visualized.  Other findings: None.  IMPRESSION: Gallbladder is distended with sludge displaying increasing wall thickness. Dilated common bile duct. Findings consistent with acute acalculous cholecystitis.  No definite common bile duct stones, although the increased diameter of the common bile duct suggests that there may be an ampullary obstruction.   Electronically Signed   By: Rolla Flatten M.D.   On: 01/18/2015 02:00   Ct Abdomen Pelvis W Contrast  01/17/2015   CLINICAL DATA:  74 year old male with right-sided abdominal pain for 4-5 days. Emesis.  EXAM: CT ABDOMEN AND PELVIS WITH CONTRAST  TECHNIQUE: Multidetector CT imaging of the abdomen and pelvis was performed using the standard protocol following bolus administration of intravenous contrast.  CONTRAST:  13m OMNIPAQUE IOHEXOL 300 MG/ML  SOLN  COMPARISON:  CT 04/08/2013  FINDINGS: Mild right greater than left basilar atelectasis. Dense coronary artery calcifications versus coronary stent. No pleural effusion.  The gallbladder is distended, there is question of mild pericholecystic soft tissue stranding. No calcified gallstones. There is prominence of the common bile duct measuring 10 mm at the porta hepatis. No focal hepatic lesion. The spleen, adrenal glands, and pancreas are unremarkable. No pancreatic ductal dilatation.  Complex cystic lesion in the lower pole the left kidney appears slightly decreased in size from prior exam, currently 3.4 x 3.4 cm, previously 4.3 x 4.8 cm. Thin peripheral enhancement again suspected medially. Additional scattered simple cysts throughout both kidneys are unchanged. There is no hydronephrosis. There is symmetric enhancement and excretion on delayed phase imaging.  Moderate hiatal hernia. Stomach is physiologically distended. There are no dilated or thickened bowel loops. The appendix is normal. There is moderate stool  throughout the colon with multifocal colonic diverticula, most significant in the distal colon. No acute diverticulitis. No colonic wall thickening. No free air, free fluid, or intra-abdominal fluid collection.  No retroperitoneal adenopathy. Abdominal aorta is normal in caliber. Moderate to dense atherosclerosis of the abdominal aorta without aneurysm.  Within the pelvis the urinary bladder is physiologically distended. Cheek artifact from bilateral hip prostheses limits pelvic evaluation. Prostate gland is not evaluated, patient is post prostatectomy by report. Surgical clips in the bilateral external iliac stations.  Postsurgical change of the anterior abdominal wall with multiple tacks.  There are no acute or suspicious osseous abnormalities. There is multilevel degenerative change throughout the lumbar spine with both degenerative disc disease and facet arthropathy.  IMPRESSION: 1. Distended gallbladder with question of adjacent inflammatory change. Prominence of the common bile duct measuring 10 mm. Findings are concerning for acute cholecystitis and possible choledocholithiasis. Right upper quadrant ultrasound could be considered for further evaluation. 2. Extensive diverticulosis without acute diverticulitis. 3. Decreased size of complex left renal cyst from prior exam. Decreasing size suggests benign etiology.   Electronically Signed   By: MJeb LeveringM.D.   On: 01/17/2015 23:38    EKG: Not done in ED, will get one.   Assessment/Plan Principal Problem:   Abdominal pain Active Problems:   HLD (hyperlipidemia)   RESTLESS LEG SYNDROME   Peripheral vascular disease   Osteoarthritis   PROSTATE CANCER, HX OF   CAD (coronary artery disease)   Hypertension   Pulmonary emphysema   Cholecystitis   Acute on chronic kidney failure  Cholecystitis: Patient's abdominal pain, nausea and vomiting are most likely caused by cholecystitis as evidenced by CT and ultrasound. CA-abd/pelvis showed  distended gallbladder with a dilated bile duct and inflammatory changes.  An abdominal ultrasound confirms a distended gallbladder with a thickened wall and  sludge. Liver function tests are normal. General surgeon was consulted. Currently patient is not obviously septic. Hemodynamically stable. -will admit to tele bed -start IV zosyn -hold plavix for possible surgery -INR/PTT/type screen -morphin for pain, Zofran for nausea -will get Procalcitonin and trend lactic acid level and blood culture -check lipase -IVF: 1.5L of NS bolus in ED, followed by 125 cc/h -follow up surgeon's Recs.  HLD: Last LDL was 82 on 10/20/14.  -hold Lovastatin, since it is not available in hospital and patient is allergic to simvastatin   RESTLESS LEG SYNDROME: -Mirapex  CAD (coronary artery disease): No chest pain -get EKG -continue ASA and imdure -hold plavix for possible procedure -prn NTG  HTN: -continue amlodipine, clonidine -When necessary hydralazine  GERD: -Protonix  AoCKD-II: Baseline Cre is 1.3, his Cre 1.93, BUN 24 is on admission. Likely due to prerenal secondary to dehydration and continuation of ibuprofen. No hydronephrosis on CT abdomen-pelvis -IVF as above -Check FeUrea  -Follow up renal function by BMP -Hold ibuprofen   DVT ppx: SCD now  Code Status: Full code Family Communication: None at bed side.   Disposition Plan: Admit to inpatient   Date of Service 01/18/2015    Ivor Costa [facility] Pager [phone]  If 7PM-7AM, please contact night-coverage [email or web address] Password [facility] 01/18/2015, 6:57 AM

## 2015-01-18 NOTE — Progress Notes (Signed)
74yo male c/o abdominal pain, nausea, and dark stool x5d, [place] c/w acute acalculous cholecystitis, to begin IV ABX.  Will start Zosyn 3.375g IV Q8H for CrCl ~30 ml/min and monitor CBC and Cx.  Wynona Neat, PharmD, BCPS 01/18/2015 3:00 AM

## 2015-01-18 NOTE — Progress Notes (Signed)
UR Completed. Yukio Bisping, RN, BSN.  [phone] 

## 2015-01-18 NOTE — Progress Notes (Signed)
Received report from [place], [place].

## 2015-01-18 NOTE — Progress Notes (Signed)
Patient ID: Victor Davis, male   DOB: [birth year], 74 y.o.   MRN: [id number]     [address] [place], [place] [address]    Phone: [phone] FAX: [phone]     Subjective: Hurting for 5 days consistently.  C/o 11/10 pain, but not impressively tender on exam.  No n/v.  VSS.  Afebrile.  LFTs are normal.  WBC down from 12.8 to 12.3k.     Objective:  Vital signs:  Filed Vitals:   01/17/15 2215 01/18/15 0230 01/18/15 0510 01/18/15 0545  BP: 160/86 166/83 181/85 167/81  Pulse: 93 107 106 105  Temp:  98.1 F (36.7 C)    TempSrc:  Oral    Resp: '16 16 16   ' Height:  '5\' 7"'  (1.702 m)    Weight:  69.3 kg (152 lb 12.5 oz)    SpO2: 96% 96% 97%     Last BM Date: 01/17/15  Intake/Output   Yesterday:  06/05 0701 - 06/06 0700 In: 3 [I.V.:3] Out: -  This shift: I/O last 3 completed shifts: In: 3 [I.V.:3] Out: -      Physical Exam: General: Pt awake/alert/oriented x4 in no acute distress  Abdomen: Soft.  Nondistended.  Minimally tender to RUQ.  No evidence of peritonitis.  No incarcerated hernias.    Problem List:   Principal Problem:   Abdominal pain Active Problems:   HLD (hyperlipidemia)   RESTLESS LEG SYNDROME   Peripheral vascular disease   Osteoarthritis   PROSTATE CANCER, HX OF   CAD (coronary artery disease)   Hypertension   Pulmonary emphysema   Cholecystitis   Acute on chronic kidney failure    Results:   Labs: Results for orders placed or performed during the hospital encounter of 01/17/15 (from the past 48 hour(s))  CBC with Differential     Status: Abnormal   Collection Time: 01/17/15  6:52 PM  Result Value Ref Range   WBC 12.8 (H) 4.0 - 10.5 K/uL   RBC 4.54 4.22 - 5.81 MIL/uL   Hemoglobin 13.1 13.0 - 17.0 g/dL   HCT 39.7 39.0 - 52.0 %   MCV 87.4 78.0 - 100.0 fL   MCH 28.9 26.0 - 34.0 pg   MCHC 33.0 30.0 - 36.0 g/dL   RDW 13.3 11.5 - 15.5 %   Platelets 162 150 - 400 K/uL   Neutrophils Relative % 76 43 - 77 %   Neutro Abs 9.7 (H) 1.7 - 7.7 K/uL   Lymphocytes Relative 15 12 - 46 %   Lymphs Abs 1.9 0.7 - 4.0 K/uL   Monocytes Relative 8 3 - 12 %   Monocytes Absolute 1.0 0.1 - 1.0 K/uL   Eosinophils Relative 1 0 - 5 %   Eosinophils Absolute 0.2 0.0 - 0.7 K/uL   Basophils Relative 0 0 - 1 %   Basophils Absolute 0.0 0.0 - 0.1 K/uL  Comprehensive metabolic panel     Status: Abnormal   Collection Time: 01/17/15  6:52 PM  Result Value Ref Range   Sodium 141 135 - 145 mmol/L   Potassium 3.9 3.5 - 5.1 mmol/L   Chloride 104 101 - 111 mmol/L   CO2 24 22 - 32 mmol/L   Glucose, Bld 147 (H) 65 - 99 mg/dL   BUN 24 (H) 6 - 20 mg/dL   Creatinine, Ser 1.93 (H) 0.61 - 1.24 mg/dL   Calcium 9.4 8.9 - 10.3 mg/dL  Total Protein 7.1 6.5 - 8.1 g/dL   Albumin 3.9 3.5 - 5.0 g/dL   AST 27 15 - 41 U/L   ALT 24 17 - 63 U/L   Alkaline Phosphatase 87 38 - 126 U/L   Total Bilirubin 0.6 0.3 - 1.2 mg/dL   GFR calc non Af Amer 32 (L) >60 mL/min   GFR calc Af Amer 37 (L) >60 mL/min    Comment: (NOTE) The eGFR has been calculated using the CKD EPI equation. This calculation has not been validated in all clinical situations. eGFR's persistently <60 mL/min signify possible Chronic Kidney Disease.    Anion gap 13 5 - 15  Lipase, blood     Status: Abnormal   Collection Time: 01/17/15  6:52 PM  Result Value Ref Range   Lipase 16 (L) 22 - 51 U/L  Urinalysis, Routine w reflex microscopic (not at [facility])     Status: Abnormal   Collection Time: 01/17/15  6:55 PM  Result Value Ref Range   Color, Urine YELLOW YELLOW   APPearance CLOUDY (A) CLEAR   Specific Gravity, Urine 1.017 1.005 - 1.030   pH 5.0 5.0 - 8.0   Glucose, UA NEGATIVE NEGATIVE mg/dL   Hgb urine dipstick NEGATIVE NEGATIVE   Bilirubin Urine NEGATIVE NEGATIVE   Ketones, ur NEGATIVE NEGATIVE mg/dL   Protein, ur 100 (A) NEGATIVE mg/dL   Urobilinogen, UA 0.2 0.0 - 1.0 mg/dL   Nitrite NEGATIVE NEGATIVE   Leukocytes, UA  NEGATIVE NEGATIVE  Urine microscopic-add on     Status: None   Collection Time: 01/17/15  6:55 PM  Result Value Ref Range   Squamous Epithelial / LPF RARE RARE   WBC, UA 0-2 <3 WBC/hpf   Bacteria, UA RARE RARE  POC occult blood, ED     Status: None   Collection Time: 01/17/15  9:24 PM  Result Value Ref Range   Fecal Occult Bld NEGATIVE NEGATIVE  Protime-INR     Status: None   Collection Time: 01/18/15  3:15 AM  Result Value Ref Range   Prothrombin Time 13.5 11.6 - 15.2 seconds   INR 1.01 0.00 - 1.49  APTT     Status: None   Collection Time: 01/18/15  3:15 AM  Result Value Ref Range   aPTT 27 24 - 37 seconds  Comprehensive metabolic panel     Status: Abnormal   Collection Time: 01/18/15  3:15 AM  Result Value Ref Range   Sodium 141 135 - 145 mmol/L   Potassium 3.9 3.5 - 5.1 mmol/L   Chloride 105 101 - 111 mmol/L   CO2 26 22 - 32 mmol/L   Glucose, Bld 174 (H) 65 - 99 mg/dL   BUN 20 6 - 20 mg/dL   Creatinine, Ser 1.53 (H) 0.61 - 1.24 mg/dL   Calcium 8.8 (L) 8.9 - 10.3 mg/dL   Total Protein 6.5 6.5 - 8.1 g/dL   Albumin 3.7 3.5 - 5.0 g/dL   AST 26 15 - 41 U/L   ALT 22 17 - 63 U/L   Alkaline Phosphatase 78 38 - 126 U/L   Total Bilirubin 1.1 0.3 - 1.2 mg/dL   GFR calc non Af Amer 43 (L) >60 mL/min   GFR calc Af Amer 50 (L) >60 mL/min    Comment: (NOTE) The eGFR has been calculated using the CKD EPI equation. This calculation has not been validated in all clinical situations. eGFR's persistently <60 mL/min signify possible Chronic Kidney Disease.    Anion gap  10 5 - 15  CBC     Status: Abnormal   Collection Time: 01/18/15  3:15 AM  Result Value Ref Range   WBC 12.3 (H) 4.0 - 10.5 K/uL   RBC 4.33 4.22 - 5.81 MIL/uL   Hemoglobin 12.3 (L) 13.0 - 17.0 g/dL   HCT 37.7 (L) 39.0 - 52.0 %   MCV 87.1 78.0 - 100.0 fL   MCH 28.4 26.0 - 34.0 pg   MCHC 32.6 30.0 - 36.0 g/dL   RDW 13.1 11.5 - 15.5 %   Platelets 134 (L) 150 - 400 K/uL  Lactic acid, plasma     Status: None    Collection Time: 01/18/15  3:15 AM  Result Value Ref Range   Lactic Acid, Venous 1.1 0.5 - 2.0 mmol/L  Procalcitonin     Status: None   Collection Time: 01/18/15  3:15 AM  Result Value Ref Range   Procalcitonin 0.12 ng/mL    Comment:        Interpretation: PCT (Procalcitonin) <= 0.5 ng/mL: Systemic infection (sepsis) is not likely. Local bacterial infection is possible. (NOTE)         ICU PCT Algorithm               Non ICU PCT Algorithm    ----------------------------     ------------------------------         PCT < 0.25 ng/mL                 PCT < 0.1 ng/mL     Stopping of antibiotics            Stopping of antibiotics       strongly encouraged.               strongly encouraged.    ----------------------------     ------------------------------       PCT level decrease by               PCT < 0.25 ng/mL       >= 80% from peak PCT       OR PCT 0.25 - 0.5 ng/mL          Stopping of antibiotics                                             encouraged.     Stopping of antibiotics           encouraged.    ----------------------------     ------------------------------       PCT level decrease by              PCT >= 0.25 ng/mL       < 80% from peak PCT        AND PCT >= 0.5 ng/mL            Continuin g antibiotics                                              encouraged.       Continuing antibiotics            encouraged.    ----------------------------     ------------------------------     PCT level increase compared  PCT > 0.5 ng/mL         with peak PCT AND          PCT >= 0.5 ng/mL             Escalation of antibiotics                                          strongly encouraged.      Escalation of antibiotics        strongly encouraged.   Type and screen     Status: None   Collection Time: 01/18/15  4:24 AM  Result Value Ref Range   ABO/RH(D) A NEG    Antibody Screen NEG    Sample Expiration 01/21/2015   Glucose, capillary     Status: Abnormal   Collection Time:  01/18/15  6:26 AM  Result Value Ref Range   Glucose-Capillary 157 (H) 65 - 99 mg/dL    Imaging / Studies: US Abdomen Complete  01/18/2015   CLINICAL DATA:  Patient with right-sided abdominal pain for 5 days.  EXAM: ULTRASOUND ABDOMEN COMPLETE  COMPARISON:  CT scan earlier today.  FINDINGS: Gallbladder: Distended with sludge. Wall thickness slightly greater than 4 mm. Negative sonographic Murphy's sign but the patient is reportedly heavily sedated.  Common bile duct: Diameter: 12 mm maximum, may taper distally to 7-9 mm.  Liver: No focal lesion identified. Within normal limits in parenchymal echogenicity.  IVC: No abnormality visualized.  Pancreas: Visualized portion unremarkable.  Spleen: Size and appearance within normal limits.  Right Kidney: Length: 10 cm. Hypoechoic lower pole cyst 2.3 x 1.5 x 1.9 cm  Left Kidney: Length: 10.3 cm. Hypoechoic lower pole cyst measuring 3.4 x 2.4 x 3.2 cm  Abdominal aorta: No aneurysm visualized.  Other findings: None.  IMPRESSION: Gallbladder is distended with sludge displaying increasing wall thickness. Dilated common bile duct. Findings consistent with acute acalculous cholecystitis.  No definite common bile duct stones, although the increased diameter of the common bile duct suggests that there may be an ampullary obstruction.   Electronically Signed   By: Rolla Flatten M.D.   On: 01/18/2015 02:00   Ct Abdomen Pelvis W Contrast  01/17/2015   CLINICAL DATA:  74 year old male with right-sided abdominal pain for 4-5 days. Emesis.  EXAM: CT ABDOMEN AND PELVIS WITH CONTRAST  TECHNIQUE: Multidetector CT imaging of the abdomen and pelvis was performed using the standard protocol following bolus administration of intravenous contrast.  CONTRAST:  159m OMNIPAQUE IOHEXOL 300 MG/ML  SOLN  COMPARISON:  CT 04/08/2013  FINDINGS: Mild right greater than left basilar atelectasis. Dense coronary artery calcifications versus coronary stent. No pleural effusion.  The gallbladder is  distended, there is question of mild pericholecystic soft tissue stranding. No calcified gallstones. There is prominence of the common bile duct measuring 10 mm at the porta hepatis. No focal hepatic lesion. The spleen, adrenal glands, and pancreas are unremarkable. No pancreatic ductal dilatation.  Complex cystic lesion in the lower pole the left kidney appears slightly decreased in size from prior exam, currently 3.4 x 3.4 cm, previously 4.3 x 4.8 cm. Thin peripheral enhancement again suspected medially. Additional scattered simple cysts throughout both kidneys are unchanged. There is no hydronephrosis. There is symmetric enhancement and excretion on delayed phase imaging.  Moderate hiatal hernia. Stomach is physiologically distended. There are no dilated or thickened bowel loops. The appendix is  normal. There is moderate stool throughout the colon with multifocal colonic diverticula, most significant in the distal colon. No acute diverticulitis. No colonic wall thickening. No free air, free fluid, or intra-abdominal fluid collection.  No retroperitoneal adenopathy. Abdominal aorta is normal in caliber. Moderate to dense atherosclerosis of the abdominal aorta without aneurysm.  Within the pelvis the urinary bladder is physiologically distended. Cheek artifact from bilateral hip prostheses limits pelvic evaluation. Prostate gland is not evaluated, patient is post prostatectomy by report. Surgical clips in the bilateral external iliac stations.  Postsurgical change of the anterior abdominal wall with multiple tacks.  There are no acute or suspicious osseous abnormalities. There is multilevel degenerative change throughout the lumbar spine with both degenerative disc disease and facet arthropathy.  IMPRESSION: 1. Distended gallbladder with question of adjacent inflammatory change. Prominence of the common bile duct measuring 10 mm. Findings are concerning for acute cholecystitis and possible choledocholithiasis.  Right upper quadrant ultrasound could be considered for further evaluation. 2. Extensive diverticulosis without acute diverticulitis. 3. Decreased size of complex left renal cyst from prior exam. Decreasing size suggests benign etiology.   Electronically Signed   By: Jeb Levering M.D.   On: 01/17/2015 23:38    Medications / Allergies:  Scheduled Meds: . amLODipine  10 mg Oral Daily  . aspirin EC  81 mg Oral Daily  . citalopram  40 mg Oral Daily  . cloNIDine  0.1 mg Oral BID  . isosorbide mononitrate  30 mg Oral Daily  . loratadine  10 mg Oral Daily  . pantoprazole (PROTONIX) IV  40 mg Intravenous Q24H  . piperacillin-tazobactam (ZOSYN)  IV  3.375 g Intravenous Q8H  . pramipexole  1 mg Oral TID  . sodium chloride  3 mL Intravenous Q12H  . zolpidem  5 mg Oral QHS   Continuous Infusions: . sodium chloride 125 mL/hr at 01/18/15 0028   PRN Meds:.acetaminophen **OR** acetaminophen, hydrALAZINE, morphine injection, nitroGLYCERIN, ondansetron (ZOFRAN) IV  Antibiotics: Anti-infectives    Start     Dose/Rate Route Frequency Ordered Stop   01/18/15 0800  piperacillin-tazobactam (ZOSYN) IVPB 3.375 g     3.375 g 12.5 mL/hr over 240 Minutes Intravenous Every 8 hours 01/18/15 0257     01/18/15 0300  piperacillin-tazobactam (ZOSYN) IVPB 3.375 g     3.375 g 100 mL/hr over 30 Minutes Intravenous  Once 01/18/15 0257 01/18/15 0524        Assessment/Plan Acute cholecystitis-given duration of symptoms, will proceed with a percutaneous cholecystostomy tube which is to remain in place for 6 weeks and then an interval cholecystectomy.   Last dose of plavix per the patient was 6/4 AM so it will be up to IR regarding timing of this.  Keep him NPO for now, but if unable to place today, he may have some ice chips.   LFTs are normal, but [place] noted increased diameter of the CBD, uncertain on significance, will discuss with Dr. Redmond Pulling. Continue with Zosyn Hold plavix  Erby Pian, [facility] Surgery Pager [phone]) For consults and floor pages call [phone]([id number])  01/18/2015 9:23 AM

## 2015-01-18 NOTE — Consult Note (Signed)
Reason for Consult:Cholecysitits Referring Physician: Dr. Wilford Sports is an 74 y.o. male.  HPI: This gentleman presents with a 5 day history of right-sided abdominal pain with occasional nausea and vomiting. He describes the pain is sharp and moderate to severe. He has not had similar discomfort in the past. He was found on CAT scan to have a distended gallbladder with a dilated bile duct and inflammatory changes in the right upper quadrant. An ultrasound confirms a distended gallbladder with a thickened wall and sludge. Liver function tests are normal. The bile duct is dilated without an obvious stone but does taper distally. The patient is currently otherwise without complaints. He is on Plavix for heart stents.  Past Medical History  Diagnosis Date  . BRADYCARDIA 10/24/2010  . CALF PAIN, RIGHT 10/22/2009  . 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 Dysuria 10/10/2010  . ERECTILE DYSFUNCTION 04/05/2007  . FATIGUE 10/02/2007  . GLUCOSE INTOLERANCE 07/15/2008  . HYPERLIPIDEMIA 07/30/2008  . HYPERSOMNIA 10/02/2007  . INSOMNIA-SLEEP DISORDER-UNSPEC 10/22/2009  . LOW BACK PAIN 04/05/2007  . OSTEOARTHRITIS 04/05/2007  . PEPTIC ULCER DISEASE 04/05/2007  . PERIPHERAL EDEMA 10/22/2009  . PERIPHERAL VASCULAR DISEASE 07/30/2008  . PROSTATE CANCER, HX OF 04/05/2007  . RESTLESS LEG SYNDROME 05/15/2007  . RHINITIS, ALLERGIC NOS 05/15/2007  . SHOULDER PAIN, LEFT 01/22/2008  . SINUSITIS- ACUTE-NOS 05/15/2007  . Unspecified essential hypertension 04/05/2007  . Unspecified visual loss 07/15/2008  . UTI 10/24/2010  . Impaired glucose tolerance 02/09/2011  . Coronary artery disease      95% followed by 80% mid LAD stenosis, circumflex 80% stenosis. There was a moderate size branching obtuse marginal with 90-95% stenosis in the inferior branch. The right coronary artery had proximal 30% stenosis. Posterior lateral was moderate-sized with 40% stenosis. The EF was 65-70%. He had a DES and  x 2 to the LAD and DES x 2 to  the circumflex 03/08/12.    [name] GERD (gastroesophageal reflux disease)   . Emphysema 04/01/2012    By CT chest, august 2013  . CKD (chronic kidney disease), stage II     Past Surgical History  Procedure Laterality Date  . Inguinal herniorrhapy left  2002    x 2  . Rotator cuff repair      left  . Knee surgery      left  . S/p ventral surgery  2009  . Prostatectomy  2002    radical  . Right hip replacement  09/22/09  . Left hip replacement    . Eye surgery      right  . Percutaneous coronary stent intervention (pci-s) N/A 03/08/2012    Procedure: PERCUTANEOUS CORONARY STENT INTERVENTION (PCI-S);  Surgeon: Burnell Blanks, MD;  Location: [facility] CATH LAB;  Service: Cardiovascular;  Laterality: N/A;    Family History  Problem Relation Age of Onset  . Heart attack Father 67    Smoker  . Heart attack Brother 26  . Colon cancer Neg Hx   . Lung cancer Sister     Social History:  reports that he quit smoking about 37 years ago. His smoking use included Cigarettes. He has a 15 pack-year smoking history. He has never used smokeless tobacco. He reports that he drinks about 0.5 oz of alcohol per week. He reports that he does not use illicit drugs.  Allergies:  Allergies  Allergen Reactions  . Amitriptyline Hcl Other (See Comments)    REACTION: confusion  . Diphenhydramine Hcl Other (See Comments)    Keeps  him awake  . Simvastatin Other (See Comments)    REACTION: leg pain    Medications: I have reviewed the patient's current medications.  Results for orders placed or performed during the hospital encounter of 01/17/15 (from the past 48 hour(s))  CBC with Differential     Status: Abnormal   Collection Time: 01/17/15  6:52 PM  Result Value Ref Range   WBC 12.8 (H) 4.0 - 10.5 K/uL   RBC 4.54 4.22 - 5.81 MIL/uL   Hemoglobin 13.1 13.0 - 17.0 g/dL   HCT 39.7 39.0 - 52.0 %   MCV 87.4 78.0 - 100.0 fL   MCH 28.9 26.0 - 34.0 pg   MCHC 33.0 30.0 - 36.0 g/dL   RDW 13.3 11.5 - 15.5 %    Platelets 162 150 - 400 K/uL   Neutrophils Relative % 76 43 - 77 %   Neutro Abs 9.7 (H) 1.7 - 7.7 K/uL   Lymphocytes Relative 15 12 - 46 %   Lymphs Abs 1.9 0.7 - 4.0 K/uL   Monocytes Relative 8 3 - 12 %   Monocytes Absolute 1.0 0.1 - 1.0 K/uL   Eosinophils Relative 1 0 - 5 %   Eosinophils Absolute 0.2 0.0 - 0.7 K/uL   Basophils Relative 0 0 - 1 %   Basophils Absolute 0.0 0.0 - 0.1 K/uL  Comprehensive metabolic panel     Status: Abnormal   Collection Time: 01/17/15  6:52 PM  Result Value Ref Range   Sodium 141 135 - 145 mmol/L   Potassium 3.9 3.5 - 5.1 mmol/L   Chloride 104 101 - 111 mmol/L   CO2 24 22 - 32 mmol/L   Glucose, Bld 147 (H) 65 - 99 mg/dL   BUN 24 (H) 6 - 20 mg/dL   Creatinine, Ser 1.93 (H) 0.61 - 1.24 mg/dL   Calcium 9.4 8.9 - 10.3 mg/dL   Total Protein 7.1 6.5 - 8.1 g/dL   Albumin 3.9 3.5 - 5.0 g/dL   AST 27 15 - 41 U/L   ALT 24 17 - 63 U/L   Alkaline Phosphatase 87 38 - 126 U/L   Total Bilirubin 0.6 0.3 - 1.2 mg/dL   GFR calc non Af Amer 32 (L) >60 mL/min   GFR calc Af Amer 37 (L) >60 mL/min    Comment: (NOTE) The eGFR has been calculated using the CKD EPI equation. This calculation has not been validated in all clinical situations. eGFR's persistently <60 mL/min signify possible Chronic Kidney Disease.    Anion gap 13 5 - 15  Lipase, blood     Status: Abnormal   Collection Time: 01/17/15  6:52 PM  Result Value Ref Range   Lipase 16 (L) 22 - 51 U/L  Urinalysis, Routine w reflex microscopic (not at [facility])     Status: Abnormal   Collection Time: 01/17/15  6:55 PM  Result Value Ref Range   Color, Urine YELLOW YELLOW   APPearance CLOUDY (A) CLEAR   Specific Gravity, Urine 1.017 1.005 - 1.030   pH 5.0 5.0 - 8.0   Glucose, UA NEGATIVE NEGATIVE mg/dL   Hgb urine dipstick NEGATIVE NEGATIVE   Bilirubin Urine NEGATIVE NEGATIVE   Ketones, ur NEGATIVE NEGATIVE mg/dL   Protein, ur 100 (A) NEGATIVE mg/dL   Urobilinogen, UA 0.2 0.0 - 1.0 mg/dL   Nitrite NEGATIVE  NEGATIVE   Leukocytes, UA NEGATIVE NEGATIVE  Urine microscopic-add on     Status: None   Collection Time: 01/17/15  6:55 PM  Result Value Ref Range   Squamous Epithelial / LPF RARE RARE   WBC, UA 0-2 <3 WBC/hpf   Bacteria, UA RARE RARE  POC occult blood, ED     Status: None   Collection Time: 01/17/15  9:24 PM  Result Value Ref Range   Fecal Occult Bld NEGATIVE NEGATIVE  Protime-INR     Status: None   Collection Time: 01/18/15  3:15 AM  Result Value Ref Range   Prothrombin Time 13.5 11.6 - 15.2 seconds   INR 1.01 0.00 - 1.49  APTT     Status: None   Collection Time: 01/18/15  3:15 AM  Result Value Ref Range   aPTT 27 24 - 37 seconds  Comprehensive metabolic panel     Status: Abnormal   Collection Time: 01/18/15  3:15 AM  Result Value Ref Range   Sodium 141 135 - 145 mmol/L   Potassium 3.9 3.5 - 5.1 mmol/L   Chloride 105 101 - 111 mmol/L   CO2 26 22 - 32 mmol/L   Glucose, Bld 174 (H) 65 - 99 mg/dL   BUN 20 6 - 20 mg/dL   Creatinine, Ser 1.53 (H) 0.61 - 1.24 mg/dL   Calcium 8.8 (L) 8.9 - 10.3 mg/dL   Total Protein 6.5 6.5 - 8.1 g/dL   Albumin 3.7 3.5 - 5.0 g/dL   AST 26 15 - 41 U/L   ALT 22 17 - 63 U/L   Alkaline Phosphatase 78 38 - 126 U/L   Total Bilirubin 1.1 0.3 - 1.2 mg/dL   GFR calc non Af Amer 43 (L) >60 mL/min   GFR calc Af Amer 50 (L) >60 mL/min    Comment: (NOTE) The eGFR has been calculated using the CKD EPI equation. This calculation has not been validated in all clinical situations. eGFR's persistently <60 mL/min signify possible Chronic Kidney Disease.    Anion gap 10 5 - 15  CBC     Status: Abnormal   Collection Time: 01/18/15  3:15 AM  Result Value Ref Range   WBC 12.3 (H) 4.0 - 10.5 K/uL   RBC 4.33 4.22 - 5.81 MIL/uL   Hemoglobin 12.3 (L) 13.0 - 17.0 g/dL   HCT 37.7 (L) 39.0 - 52.0 %   MCV 87.1 78.0 - 100.0 fL   MCH 28.4 26.0 - 34.0 pg   MCHC 32.6 30.0 - 36.0 g/dL   RDW 13.1 11.5 - 15.5 %   Platelets 134 (L) 150 - 400 K/uL  Lactic acid, plasma      Status: None   Collection Time: 01/18/15  3:15 AM  Result Value Ref Range   Lactic Acid, Venous 1.1 0.5 - 2.0 mmol/L  Procalcitonin     Status: None   Collection Time: 01/18/15  3:15 AM  Result Value Ref Range   Procalcitonin 0.12 ng/mL    Comment:        Interpretation: PCT (Procalcitonin) <= 0.5 ng/mL: Systemic infection (sepsis) is not likely. Local bacterial infection is possible. (NOTE)         ICU PCT Algorithm               Non ICU PCT Algorithm    ----------------------------     ------------------------------         PCT < 0.25 ng/mL                 PCT < 0.1 ng/mL     Stopping of antibiotics  Stopping of antibiotics       strongly encouraged.               strongly encouraged.    ----------------------------     ------------------------------       PCT level decrease by               PCT < 0.25 ng/mL       >= 80% from peak PCT       OR PCT 0.25 - 0.5 ng/mL          Stopping of antibiotics                                             encouraged.     Stopping of antibiotics           encouraged.    ----------------------------     ------------------------------       PCT level decrease by              PCT >= 0.25 ng/mL       < 80% from peak PCT        AND PCT >= 0.5 ng/mL            Continuin g antibiotics                                              encouraged.       Continuing antibiotics            encouraged.    ----------------------------     ------------------------------     PCT level increase compared          PCT > 0.5 ng/mL         with peak PCT AND          PCT >= 0.5 ng/mL             Escalation of antibiotics                                          strongly encouraged.      Escalation of antibiotics        strongly encouraged.   Type and screen     Status: None   Collection Time: 01/18/15  4:24 AM  Result Value Ref Range   ABO/RH(D) A NEG    Antibody Screen NEG    Sample Expiration 01/21/2015     US Abdomen Complete  01/18/2015    CLINICAL DATA:  Patient with right-sided abdominal pain for 5 days.  EXAM: ULTRASOUND ABDOMEN COMPLETE  COMPARISON:  CT scan earlier today.  FINDINGS: Gallbladder: Distended with sludge. Wall thickness slightly greater than 4 mm. Negative sonographic Murphy's sign but the patient is reportedly heavily sedated.  Common bile duct: Diameter: 12 mm maximum, may taper distally to 7-9 mm.  Liver: No focal lesion identified. Within normal limits in parenchymal echogenicity.  IVC: No abnormality visualized.  Pancreas: Visualized portion unremarkable.  Spleen: Size and appearance within normal limits.  Right Kidney: Length: 10 cm. Hypoechoic lower pole cyst 2.3 x 1.5 x 1.9 cm  Left Kidney: Length: 10.3 cm. Hypoechoic lower pole  cyst measuring 3.4 x 2.4 x 3.2 cm  Abdominal aorta: No aneurysm visualized.  Other findings: None.  IMPRESSION: Gallbladder is distended with sludge displaying increasing wall thickness. Dilated common bile duct. Findings consistent with acute acalculous cholecystitis.  No definite common bile duct stones, although the increased diameter of the common bile duct suggests that there may be an ampullary obstruction.   Electronically Signed   By: Rolla Flatten M.D.   On: 01/18/2015 02:00   Ct Abdomen Pelvis W Contrast  01/17/2015   CLINICAL DATA:  74 year old male with right-sided abdominal pain for 4-5 days. Emesis.  EXAM: CT ABDOMEN AND PELVIS WITH CONTRAST  TECHNIQUE: Multidetector CT imaging of the abdomen and pelvis was performed using the standard protocol following bolus administration of intravenous contrast.  CONTRAST:  145m OMNIPAQUE IOHEXOL 300 MG/ML  SOLN  COMPARISON:  CT 04/08/2013  FINDINGS: Mild right greater than left basilar atelectasis. Dense coronary artery calcifications versus coronary stent. No pleural effusion.  The gallbladder is distended, there is question of mild pericholecystic soft tissue stranding. No calcified gallstones. There is prominence of the common bile duct  measuring 10 mm at the porta hepatis. No focal hepatic lesion. The spleen, adrenal glands, and pancreas are unremarkable. No pancreatic ductal dilatation.  Complex cystic lesion in the lower pole the left kidney appears slightly decreased in size from prior exam, currently 3.4 x 3.4 cm, previously 4.3 x 4.8 cm. Thin peripheral enhancement again suspected medially. Additional scattered simple cysts throughout both kidneys are unchanged. There is no hydronephrosis. There is symmetric enhancement and excretion on delayed phase imaging.  Moderate hiatal hernia. Stomach is physiologically distended. There are no dilated or thickened bowel loops. The appendix is normal. There is moderate stool throughout the colon with multifocal colonic diverticula, most significant in the distal colon. No acute diverticulitis. No colonic wall thickening. No free air, free fluid, or intra-abdominal fluid collection.  No retroperitoneal adenopathy. Abdominal aorta is normal in caliber. Moderate to dense atherosclerosis of the abdominal aorta without aneurysm.  Within the pelvis the urinary bladder is physiologically distended. Cheek artifact from bilateral hip prostheses limits pelvic evaluation. Prostate gland is not evaluated, patient is post prostatectomy by report. Surgical clips in the bilateral external iliac stations.  Postsurgical change of the anterior abdominal wall with multiple tacks.  There are no acute or suspicious osseous abnormalities. There is multilevel degenerative change throughout the lumbar spine with both degenerative disc disease and facet arthropathy.  IMPRESSION: 1. Distended gallbladder with question of adjacent inflammatory change. Prominence of the common bile duct measuring 10 mm. Findings are concerning for acute cholecystitis and possible choledocholithiasis. Right upper quadrant ultrasound could be considered for further evaluation. 2. Extensive diverticulosis without acute diverticulitis. 3. Decreased  size of complex left renal cyst from prior exam. Decreasing size suggests benign etiology.   Electronically Signed   By: MJeb LeveringM.D.   On: 01/17/2015 23:38    Review of Systems  All other systems reviewed and are negative.  Blood pressure 167/81, pulse 105, temperature 98.1 F (36.7 C), temperature source Oral, resp. rate 16, height '5\' 7"'  (1.702 m), weight 69.3 kg (152 lb 12.5 oz), SpO2 97 %. Physical Exam  Constitutional: He is oriented to person, place, and time. He appears well-developed and well-nourished. No distress.  HENT:  Head: Normocephalic and atraumatic.  Right Ear: External ear normal.  Left Ear: External ear normal.  Nose: Nose normal.  Mouth/Throat: Oropharynx is clear and moist. No oropharyngeal exudate.  Eyes:  Conjunctivae are normal. Pupils are equal, round, and reactive to light. Right eye exhibits no discharge. Left eye exhibits no discharge. No scleral icterus.  Neck: Normal range of motion. No tracheal deviation present.  Cardiovascular: Normal rate, regular rhythm, normal heart sounds and intact distal pulses.   No murmur heard. Respiratory: Effort normal and breath sounds normal. No respiratory distress. He has no wheezes.  GI: Soft. There is tenderness. There is guarding.  There is tenderness with guarding in the right upper quadrant  Musculoskeletal: Normal range of motion. He exhibits no edema or tenderness.  Lymphadenopathy:    He has no cervical adenopathy.  Neurological: He is alert and oriented to person, place, and time.  Skin: Skin is warm and dry. No rash noted. No erythema.  Psychiatric: His behavior is normal. Judgment normal.    Assessment/Plan: Acute cholecystitis  He has been admitted and started on IV antibody. He does have a dilated bile duct of uncertain cause with normal liver function tests. He is also on Plavix. He will need eventual laparoscopic cholecystectomy. He may need or may not need preoperative evaluation of the distal  bile duct with an MRCP. I will discuss this with our Charity fundraiser of the week. We will follow him closely.  Jesyka Slaght A 01/18/2015, 6:28 AM

## 2015-01-18 NOTE — Consult Note (Signed)
Chief Complaint: Chief Complaint  Patient presents with  . Emesis  . Abdominal Pain  RUQ pain- cholecystitis  Referring Physician(s): TRH/CCS  History of Present Illness: Victor Davis is a 74 y.o. male   Hx prostate Ca Abdominal pain x 5-6 days N/V [place] and CT reveal acute cholecystitis CCS has seen pt and are requesting percutaneous cholecystostomy drain prior to possible surgery in several weeks time Afeb; wbc 12.3 Pt on Plavix daily secondarry cardiac stent placed 2013---Last dose 6/4 or 6/5 Dr Anselm Pancoast has reviewed imaging and approves procedure He is aware of Plavix dosing I have seen and examined pt   Past Medical History  Diagnosis Date  . BRADYCARDIA 10/24/2010  . CALF PAIN, RIGHT 10/22/2009  . 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 Dysuria 10/10/2010  . ERECTILE DYSFUNCTION 04/05/2007  . FATIGUE 10/02/2007  . GLUCOSE INTOLERANCE 07/15/2008  . HYPERLIPIDEMIA 07/30/2008  . HYPERSOMNIA 10/02/2007  . INSOMNIA-SLEEP DISORDER-UNSPEC 10/22/2009  . LOW BACK PAIN 04/05/2007  . OSTEOARTHRITIS 04/05/2007  . PEPTIC ULCER DISEASE 04/05/2007  . PERIPHERAL EDEMA 10/22/2009  . PERIPHERAL VASCULAR DISEASE 07/30/2008  . PROSTATE CANCER, HX OF 04/05/2007  . RESTLESS LEG SYNDROME 05/15/2007  . RHINITIS, ALLERGIC NOS 05/15/2007  . SHOULDER PAIN, LEFT 01/22/2008  . SINUSITIS- ACUTE-NOS 05/15/2007  . Unspecified essential hypertension 04/05/2007  . Unspecified visual loss 07/15/2008  . UTI 10/24/2010  . Impaired glucose tolerance 02/09/2011  . Coronary artery disease      95% followed by 80% mid LAD stenosis, circumflex 80% stenosis. There was a moderate size branching obtuse marginal with 90-95% stenosis in the inferior branch. The right coronary artery had proximal 30% stenosis. Posterior lateral was moderate-sized with 40% stenosis. The EF was 65-70%. He had a DES and  x 2 to the LAD and DES x 2 to the circumflex 03/08/12.    [name] GERD (gastroesophageal reflux disease)   . Emphysema 04/01/2012   By CT chest, august 2013  . CKD (chronic kidney disease), stage II     Past Surgical History  Procedure Laterality Date  . Inguinal herniorrhapy left  2002    x 2  . Rotator cuff repair      left  . Knee surgery      left  . S/p ventral surgery  2009  . Prostatectomy  2002    radical  . Right hip replacement  09/22/09  . Left hip replacement    . Eye surgery      right  . Percutaneous coronary stent intervention (pci-s) N/A 03/08/2012    Procedure: PERCUTANEOUS CORONARY STENT INTERVENTION (PCI-S);  Surgeon: Burnell Blanks, MD;  Location: [facility] CATH LAB;  Service: Cardiovascular;  Laterality: N/A;    Allergies: Amitriptyline hcl; Diphenhydramine hcl; and Simvastatin  Medications: Prior to Admission medications   Medication Sig Start Date End Date Taking? Authorizing Provider  alum & mag hydroxide-simeth (MAALOX/MYLANTA) 200-200-20 MG/5ML suspension Take 30 mLs by mouth every 6 (six) hours as needed for indigestion or heartburn.   Yes Historical Provider, MD  amLODipine (NORVASC) 10 MG tablet Take 1 tablet (10 mg total) by mouth daily. 10/16/14  Yes Biagio Borg, MD  aspirin 81 MG EC tablet Take 81 mg by mouth daily.     Yes Historical Provider, MD  citalopram (CELEXA) 40 MG tablet Take 1 tablet (40 mg total) by mouth daily. 02/03/14  Yes Biagio Borg, MD  cloNIDine (CATAPRES) 0.1 MG tablet Take 1 tablet by mouth every AM and 2 tablets by  mouth every PM 04/21/14  Yes Biagio Borg, MD  clopidogrel (PLAVIX) 75 MG tablet Take 1 tablet (75 mg total) by mouth daily. 10/16/14  Yes Burnell Blanks, MD  ibuprofen (ADVIL,MOTRIN) 200 MG tablet Take 400 mg by mouth every 6 (six) hours as needed. pain   Yes Historical Provider, MD  isosorbide mononitrate (IMDUR) 30 MG 24 hr tablet TAKE 1 TABLET (30 MG TOTAL) BY MOUTH DAILY. 09/10/14  Yes Burnell Blanks, MD  loratadine (CLARITIN) 10 MG tablet Take 10 mg by mouth daily.   Yes Historical Provider, MD  lovastatin (MEVACOR) 40 MG tablet  TAKE 1 TABLET BY MOUTH AT BEDTIME 08/05/14  Yes Burnell Blanks, MD  nitroGLYCERIN (NITROSTAT) 0.4 MG SL tablet Place 1 tablet (0.4 mg total) under the tongue every 5 (five) minutes as needed for chest pain. 08/20/14 10/06/15 Yes Burnell Blanks, MD  pantoprazole (PROTONIX) 40 MG tablet Take 1 tablet (40 mg total) by mouth daily. 08/20/14  Yes Burnell Blanks, MD  pramipexole (MIRAPEX) 1 MG tablet TAKE 1 TABLET (1 MG TOTAL) BY MOUTH 3 (THREE) TIMES DAILY. Patient taking differently: TAKE 1 TABLET (1 MG TOTAL) BY MOUTH TWICE DAILY 06/22/14  Yes Biagio Borg, MD  zolpidem (AMBIEN) 5 MG tablet Take 1 tablet (5 mg total) by mouth at bedtime as needed for sleep. Patient taking differently: Take 5 mg by mouth at bedtime.  02/20/12 01/17/15 Yes Biagio Borg, MD  isosorbide mononitrate (IMDUR) 30 MG 24 hr tablet TAKE 1 TABLET (30 MG TOTAL) BY MOUTH DAILY. 12/28/14   Burnell Blanks, MD     Family History  Problem Relation Age of Onset  . Heart attack Father 42    Smoker  . Heart attack Brother 17  . Colon cancer Neg Hx   . Lung cancer Sister     History   Social History  . Marital Status: Married    Spouse Name: N/A  . Number of Children: 2  . Years of Education: N/A   Occupational History  . truck driver-retired    Social History Main Topics  . Smoking status: Former Smoker -- 1.00 packs/day for 15 years    Types: Cigarettes    Quit date: 02/05/1977  . Smokeless tobacco: Never Used  . Alcohol Use: 0.5 oz/week    1 drink(s) per week     Comment: RARE  . Drug Use: No  . Sexual Activity: Not Currently   Other Topics Concern  . None   Social History Narrative    Review of Systems: A 12 point ROS discussed and pertinent positives are indicated in the HPI above.  All other systems are negative.  Review of Systems  Constitutional: Positive for fever, diaphoresis, activity change and appetite change.  Respiratory: Negative for cough and shortness of breath.     Cardiovascular: Negative for chest pain.  Gastrointestinal: Positive for nausea, vomiting and abdominal pain.  Musculoskeletal: Negative for back pain.  Neurological: Positive for weakness.  Psychiatric/Behavioral: Negative for behavioral problems and confusion.    Vital Signs: BP 167/81 mmHg  Pulse 105  Temp(Src) 98.1 F (36.7 C) (Oral)  Resp 16  Ht '5\' 7"'$  (1.702 m)  Wt 69.3 kg (152 lb 12.5 oz)  BMI 23.92 kg/m2  SpO2 97%  Physical Exam  Constitutional: He is oriented to person, place, and time. He appears well-developed.  Cardiovascular: Normal rate, regular rhythm and normal heart sounds.   Pulmonary/Chest: Effort normal and breath sounds normal. He has  no wheezes.  Abdominal: Soft. Bowel sounds are normal. There is tenderness.  Musculoskeletal: Normal range of motion.  Neurological: He is alert and oriented to person, place, and time.  Slightly groggy But answers all questions appropriately  Skin: Skin is warm and dry.  Psychiatric: He has a normal mood and affect. His behavior is normal. Judgment and thought content normal.  Nursing note and vitals reviewed.   Mallampati Score:  MD Evaluation Airway: WNL Heart: WNL Abdomen: WNL Chest/ Lungs: WNL ASA  Classification: 3 Mallampati/Airway Score: Two  Imaging: US Abdomen Complete  01/18/2015   CLINICAL DATA:  Patient with right-sided abdominal pain for 5 days.  EXAM: ULTRASOUND ABDOMEN COMPLETE  COMPARISON:  CT scan earlier today.  FINDINGS: Gallbladder: Distended with sludge. Wall thickness slightly greater than 4 mm. Negative sonographic Murphy's sign but the patient is reportedly heavily sedated.  Common bile duct: Diameter: 12 mm maximum, may taper distally to 7-9 mm.  Liver: No focal lesion identified. Within normal limits in parenchymal echogenicity.  IVC: No abnormality visualized.  Pancreas: Visualized portion unremarkable.  Spleen: Size and appearance within normal limits.  Right Kidney: Length: 10 cm. Hypoechoic  lower pole cyst 2.3 x 1.5 x 1.9 cm  Left Kidney: Length: 10.3 cm. Hypoechoic lower pole cyst measuring 3.4 x 2.4 x 3.2 cm  Abdominal aorta: No aneurysm visualized.  Other findings: None.  IMPRESSION: Gallbladder is distended with sludge displaying increasing wall thickness. Dilated common bile duct. Findings consistent with acute acalculous cholecystitis.  No definite common bile duct stones, although the increased diameter of the common bile duct suggests that there may be an ampullary obstruction.   Electronically Signed   By: Rolla Flatten M.D.   On: 01/18/2015 02:00   Ct Abdomen Pelvis W Contrast  01/17/2015   CLINICAL DATA:  74 year old male with right-sided abdominal pain for 4-5 days. Emesis.  EXAM: CT ABDOMEN AND PELVIS WITH CONTRAST  TECHNIQUE: Multidetector CT imaging of the abdomen and pelvis was performed using the standard protocol following bolus administration of intravenous contrast.  CONTRAST:  151m OMNIPAQUE IOHEXOL 300 MG/ML  SOLN  COMPARISON:  CT 04/08/2013  FINDINGS: Mild right greater than left basilar atelectasis. Dense coronary artery calcifications versus coronary stent. No pleural effusion.  The gallbladder is distended, there is question of mild pericholecystic soft tissue stranding. No calcified gallstones. There is prominence of the common bile duct measuring 10 mm at the porta hepatis. No focal hepatic lesion. The spleen, adrenal glands, and pancreas are unremarkable. No pancreatic ductal dilatation.  Complex cystic lesion in the lower pole the left kidney appears slightly decreased in size from prior exam, currently 3.4 x 3.4 cm, previously 4.3 x 4.8 cm. Thin peripheral enhancement again suspected medially. Additional scattered simple cysts throughout both kidneys are unchanged. There is no hydronephrosis. There is symmetric enhancement and excretion on delayed phase imaging.  Moderate hiatal hernia. Stomach is physiologically distended. There are no dilated or thickened bowel loops.  The appendix is normal. There is moderate stool throughout the colon with multifocal colonic diverticula, most significant in the distal colon. No acute diverticulitis. No colonic wall thickening. No free air, free fluid, or intra-abdominal fluid collection.  No retroperitoneal adenopathy. Abdominal aorta is normal in caliber. Moderate to dense atherosclerosis of the abdominal aorta without aneurysm.  Within the pelvis the urinary bladder is physiologically distended. Cheek artifact from bilateral hip prostheses limits pelvic evaluation. Prostate gland is not evaluated, patient is post prostatectomy by report. Surgical clips in the bilateral  external iliac stations.  Postsurgical change of the anterior abdominal wall with multiple tacks.  There are no acute or suspicious osseous abnormalities. There is multilevel degenerative change throughout the lumbar spine with both degenerative disc disease and facet arthropathy.  IMPRESSION: 1. Distended gallbladder with question of adjacent inflammatory change. Prominence of the common bile duct measuring 10 mm. Findings are concerning for acute cholecystitis and possible choledocholithiasis. Right upper quadrant ultrasound could be considered for further evaluation. 2. Extensive diverticulosis without acute diverticulitis. 3. Decreased size of complex left renal cyst from prior exam. Decreasing size suggests benign etiology.   Electronically Signed   By: Jeb Levering M.D.   On: 01/17/2015 23:38    Labs:  CBC:  Recent Labs  04/21/14 1203 10/20/14 1013 01/17/15 1852 01/18/15 0315  WBC 9.4 7.8 12.8* 12.3*  HGB 13.7 12.4* 13.1 12.3*  HCT 41.0 36.5* 39.7 37.7*  PLT 175.0 179.0 162 134*    COAGS:  Recent Labs  01/18/15 0315  INR 1.01  APTT 27    BMP:  Recent Labs  04/21/14 1203 10/20/14 1013 01/17/15 1852 01/18/15 0315  NA 139 139 141 141  K 4.1 4.7 3.9 3.9  CL 105 107 104 105  CO2 '28 28 24 26  '$ GLUCOSE 89 109* 147* 174*  BUN 18 26* 24*  20  CALCIUM 9.7 9.3 9.4 8.8*  CREATININE 1.3 1.59* 1.93* 1.53*  GFRNONAA  --   --  32* 43*  GFRAA  --   --  37* 50*    LIVER FUNCTION TESTS:  Recent Labs  04/21/14 1203 10/20/14 1013 01/17/15 1852 01/18/15 0315  BILITOT 1.4* 0.7 0.6 1.1  AST '24 12 27 26  '$ ALT '19 10 24 22  '$ ALKPHOS 73 73 87 78  PROT 7.1 6.2 7.1 6.5  ALBUMIN 4.2 3.9 3.9 3.7    TUMOR MARKERS: No results for input(s): AFPTM, CEA, CA199, CHROMGRNA in the last 8760 hours.  Assessment and Plan:  Acute cholecystitis Acute abd pain N/V Scheduled for perc chole drain in IR Risks and Benefits discussed with the patient including, but not limited to bleeding, infection, gallbladder perforation, bile leak, sepsis or even death. All of the patient's questions were answered, patient is agreeable to proceed. Consent signed and in chart.   Thank you for this interesting consult.  I greatly enjoyed meeting KHRIZ LIDDY and look forward to participating in their care.  Signed: Anson Peddie A 01/18/2015, 10:17 AM   I spent a total of 40 Minutes    in face to face in clinical consultation, greater than 50% of which was counseling/coordinating care for perc chole drain

## 2015-01-18 NOTE — Sedation Documentation (Signed)
Patient denies pain and is resting comfortably.  

## 2015-01-18 NOTE — Progress Notes (Signed)
Attempted report. No answer. 

## 2015-01-18 NOTE — Care Management Note (Signed)
Case Management Note  Patient Details  Name: Victor Davis MRN: [id number] Date of Birth: 1941/03/02  Subjective/Objective:        Pt admitted with 5 day c/o right upper quadrant abdominal pain            Action/Plan:  Pt will have drain placed by IR left in place for approximately 6 weeks, then he will have an interval choecystectomy.  CM will continue to monitor for disposition needs.   Expected Discharge Date:                  Expected Discharge Plan:  [place]  In-House Referral:     Discharge planning Services  CM Consult  Post Acute Care Choice:    Choice offered to:     DME Arranged:   Victor Davis DME Agency:   [facility]  Victor Davis Arranged:   Registered Nurse (drain care), Physical Therapist, Occupational Therapist Victor Davis Agency:   Victor Davis  Status of Service: Complete, will sign off  Medicare Important Message Given:  Yes Date Medicare IM Given:  01/18/15 Medicare IM give by:  Victor Davis Date Additional Medicare IM Given:  01/19/15 Additional Medicare Important Message give by:   Victor Davis  If discussed at Long Length of Stay Meetings, dates discussed:    Additional Comments: 01/19/15 Victor Quinones, RN, BSN [phone] CM was contacted by Victor Davis and informed that [facility] would not be able to start before a week post discharge.  CM contacted wife and offered choice, pt chose [place].  Pt's wife was admitted to [facility] 6/6 evening and is unable to provide supervision for pt.  Wife has made arrangements with her sister Victor Davis for pt to stay post discharge.  CM communicated 24 hour recommendation; Ms Victor Davis works during the day however grand daughter and neighbor  will provide supervision for pt during day hours.   CM contacted Victor [place], Ms Victor Davis verified that pt would reside with her initially post discharge and agreed to [facility] services coming into her home.  Victor Davis ( mobile [phone], home [phone])  Address: [address] [place] [address].  [facility] contacted, referral accepted, address and phone numbers of Victor Davis provided to agency.  Victor Davis will pick pt up from hospital and requested to be contacted shortly prior to discharge.  CM will communicate request to bedside nurse and place sticky note on summary tab.  No additional CM needs    01/18/15 Victor Quinones, RN, BSN [phone] CM spoke with pt regarding [facility] services, pt instructed nurse to ask wife.  Wife contacted and stated she would prefer [place] as she is already receiving [place] with agency.  CM contacted [place].    Victor Labrador, RN 01/18/2015, 11:51 AM

## 2015-01-19 LAB — HEPATIC FUNCTION PANEL
ALK PHOS: 90 U/L (ref 38–126)
ALT: 28 U/L (ref 17–63)
AST: 30 U/L (ref 15–41)
Albumin: 2.7 g/dL — ABNORMAL LOW (ref 3.5–5.0)
Bilirubin, Direct: 0.4 mg/dL (ref 0.1–0.5)
Indirect Bilirubin: 1.4 mg/dL — ABNORMAL HIGH (ref 0.3–0.9)
Total Bilirubin: 1.8 mg/dL — ABNORMAL HIGH (ref 0.3–1.2)
Total Protein: 5.5 g/dL — ABNORMAL LOW (ref 6.5–8.1)

## 2015-01-19 LAB — CBC
HEMATOCRIT: 37.2 % — AB (ref 39.0–52.0)
HEMOGLOBIN: 12.1 g/dL — AB (ref 13.0–17.0)
MCH: 28.6 pg (ref 26.0–34.0)
MCHC: 32.5 g/dL (ref 30.0–36.0)
MCV: 87.9 fL (ref 78.0–100.0)
Platelets: 125 10*3/uL — ABNORMAL LOW (ref 150–400)
RBC: 4.23 MIL/uL (ref 4.22–5.81)
RDW: 13.5 % (ref 11.5–15.5)
WBC: 15 10*3/uL — ABNORMAL HIGH (ref 4.0–10.5)

## 2015-01-19 LAB — BASIC METABOLIC PANEL
ANION GAP: 9 (ref 5–15)
BUN: 23 mg/dL — ABNORMAL HIGH (ref 6–20)
CALCIUM: 8.7 mg/dL — AB (ref 8.9–10.3)
CO2: 25 mmol/L (ref 22–32)
CREATININE: 1.71 mg/dL — AB (ref 0.61–1.24)
Chloride: 107 mmol/L (ref 101–111)
GFR calc Af Amer: 43 mL/min — ABNORMAL LOW (ref >60)
GFR, EST NON AFRICAN AMERICAN: 37 mL/min — AB (ref >60)
Glucose, Bld: 145 mg/dL — ABNORMAL HIGH (ref 65–99)
Potassium: 3.9 mmol/L (ref 3.5–5.1)
Sodium: 141 mmol/L (ref 135–145)

## 2015-01-19 LAB — GLUCOSE, CAPILLARY: Glucose-Capillary: 144 mg/dL — ABNORMAL HIGH (ref 65–99)

## 2015-01-19 LAB — UREA NITROGEN, URINE: Urea Nitrogen, Ur: 747 mg/dL

## 2015-01-19 MED ORDER — ENOXAPARIN SODIUM 40 MG/0.4ML ~~LOC~~ SOLN
40.0000 mg | SUBCUTANEOUS | Status: DC
  Administered 2015-01-19 – 2015-01-20 (×2): 40 mg via SUBCUTANEOUS
  Filled 2015-01-19 (×3): qty 0.4

## 2015-01-19 NOTE — Progress Notes (Signed)
Spoke with Dr. Cruzita Lederer, verbal order for bladder scan >300 in and out cath. Kathleen Argue S 4:04 PM

## 2015-01-19 NOTE — Progress Notes (Signed)
PROGRESS NOTE  [name]:[id number] DOB: [birth year] DOA: 01/17/2015 PCP: Cathlean Cower, MD   HPI: Victor Davis is a 74 y.o. Male with a history of HTN, HLD, GERD, PUD, PVD, CAD, pulmonary emphysema, sleep disorder and prostate cancer who presented to the ED 01/17/15 with right-sided abdominal pain, vomiting, and occasional dark stool for 5 days. An abdominal ultrasound confirmed a distended gallbladder with wall-thickening and sludge.   Subjective / 24 H Interval events - drain site tender - drowsy but alert and oriented when prompted - appreciates abdominal pain better but not eating much  Assessment/Plan: Principal Problem:   Abdominal pain Active Problems:   HLD (hyperlipidemia)   RESTLESS LEG SYNDROME   Peripheral vascular disease   Osteoarthritis   PROSTATE CANCER, HX OF   CAD (coronary artery disease)   Hypertension   Pulmonary emphysema   Cholecystitis   Acute on chronic kidney failure   Acute cholecystitis  Acute acalculous cholecystitis - percutaneous chole drain placed yesterday per IR - draining well  - to remain in place until OR or 6-8 weeks - appreciate IR follow-up - continue Zosyn, microbiology with GNR, awaiting speciation - mild increase in his bilrubin and WBC today likely post-procedure, monitor CMP in am, CBC. Clinically stable, afebrile.   HLD - on statin at home  RLS - continue Mirapex  CAD - no chest pain, continue aspirin, imdur, plavix now on hold, resume per IR post procedure  HTN - continue Norvasc, clonidine  AKI on CKD - baseline Cr 1.3 - 1.9 on admission >> 1.5 >> 1.7, overall stable   Diet: DIET SOFT Room service appropriate?: Yes; Fluid consistency:: Thin Fluids: NS DVT Prophylaxis: Lovenox  Code Status: Full Code Family Communication: None  Disposition Plan: Home when ready  Consultants:  Surgery  IR  Procedures:  Percutaneous Cholecystostomy   Antibiotics Zosyn 6/5 >>   Studies  US Abdomen Complete  01/18/2015 Gallbladder is distended with sludge displaying increasing wall thickness. Dilated common bile duct. Findings consistent with acute acalculous cholecystitis.  No definite common bile duct stones, although the increased diameter of the common bile duct suggests that there may be an ampullary obstruction.     Ct Abdomen Pelvis W Contrast 01/17/2015 1. Distended gallbladder with question of adjacent inflammatory change. Prominence of the common bile duct measuring 10 mm. Findings are concerning for acute cholecystitis and possible choledocholithiasis. Right upper quadrant ultrasound could be considered for further evaluation. 2. Extensive diverticulosis without acute diverticulitis. 3. Decreased size of complex left renal cyst from prior exam. Decreasing size suggests benign etiology.    Ir Perc Cholecystostomy 01/18/2015  Successful cholecystostomy tube placement with ultrasound and fluoroscopic guidance.   Objective  Filed Vitals:   01/18/15 1600 01/18/15 2015 01/19/15 0416 01/19/15 0959  BP: 162/82 153/80 144/74 154/71  Pulse: 92 94 92   Temp:  98.2 F (36.8 C) 97.8 F (36.6 C)   TempSrc:  Oral Oral   Resp: _0 Height:      Weight:      SpO2: 96% 97% 98%     Intake/Output Summary (Last 24 hours) at 01/19/15 1303 Last data filed at 01/19/15 0200  Gross per 24 hour  Intake      0 ml  Output    230 ml  Net   -230 ml   Filed Weights   01/18/15 0230  Weight: 69.3 kg (152 lb 12.5 oz)    Exam:  General:  NAD, resting in bed,  drowsy, breakfast plate in front of him  HEENT: no scleral icterus noted  Cardiovascular: RRR without MRG, 2+ peripheral pulses, no edema  Respiratory: CTA biL, good air movement, no wheezing, no crackles, no rales  Abdomen: soft, tender around PTC site, PTC bag with dark yellow fluid  MSK/Extremities: no clubbing/cyanosis, no joint swelling  Data Reviewed: Basic Metabolic Panel:  Recent Labs Lab 01/17/15 1852 01/18/15 0315  01/19/15 0458  NA 141 141 141  K 3.9 3.9 3.9  CL 104 105 107  CO2 _0 GLUCOSE 147* 174* 145*  BUN 24* 20 23*  CREATININE 1.93* 1.53* 1.71*  CALCIUM 9.4 8.8* 8.7*   Liver Function Tests:  Recent Labs Lab 01/17/15 1852 01/18/15 0315 01/19/15 0458  AST _1 ALT _2 ALKPHOS 87 78 90  BILITOT 0.6 1.1 1.8*  PROT 7.1 6.5 5.5*  ALBUMIN 3.9 3.7 2.7*    Recent Labs Lab 01/17/15 1852  LIPASE 16*   CBC:  Recent Labs Lab 01/17/15 1852 01/18/15 0315 01/19/15 0458  WBC 12.8* 12.3* 15.0*  NEUTROABS 9.7*  --   --   HGB 13.1 12.3* 12.1*  HCT 39.7 37.7* 37.2*  MCV 87.4 87.1 87.9  PLT 162 134* 125*   CBG:  Recent Labs Lab 01/18/15 0626 01/19/15 0605  GLUCAP 157* 144*    Recent Results (from the past 240 hour(s))  Culture, blood (x 2)     Status: None (Preliminary result)   Collection Time: 01/18/15  3:15 AM  Result Value Ref Range Status   Specimen Description BLOOD LEFT ANTECUBITAL  Final   Special Requests BOTTLES DRAWN AEROBIC AND ANAEROBIC 5CC  Final   Culture   Final           BLOOD CULTURE RECEIVED NO GROWTH TO DATE CULTURE WILL BE HELD FOR 5 DAYS BEFORE ISSUING A FINAL NEGATIVE REPORT Performed at [facility]    Report Status PENDING  Incomplete  Culture, blood (x 2)     Status: None (Preliminary result)   Collection Time: 01/18/15  3:30 AM  Result Value Ref Range Status   Specimen Description BLOOD RIGHT ANTECUBITAL  Final   Special Requests BOTTLES DRAWN AEROBIC AND ANAEROBIC 5CC  Final   Culture   Final           BLOOD CULTURE RECEIVED NO GROWTH TO DATE CULTURE WILL BE HELD FOR 5 DAYS BEFORE ISSUING A FINAL NEGATIVE REPORT Performed at [facility]    Report Status PENDING  Incomplete  BODY FLUID CULTURE     Status: None (Preliminary result)   Collection Time: 01/18/15  3:48 PM  Result Value Ref Range Status   Specimen Description FLUID BILE  Final   Special Requests NONE  Final   Gram Stain   Final    NO WBC  SEEN RARE GRAM NEGATIVE RODS Performed at [facility]    Culture PENDING  Incomplete   Report Status PENDING  Incomplete     Scheduled Meds: . amLODipine  10 mg Oral Daily  . aspirin EC  81 mg Oral Daily  . citalopram  20 mg Oral Daily  . cloNIDine  0.1 mg Oral BID  . enoxaparin (LOVENOX) injection  40 mg Subcutaneous Q24H  . isosorbide mononitrate  30 mg Oral Daily  . loratadine  10 mg Oral Daily  . pantoprazole (PROTONIX) IV  40 mg Intravenous Q24H  . piperacillin-tazobactam (ZOSYN)  IV  3.375 g Intravenous Q8H  .  pramipexole  1 mg Oral TID  . sodium chloride  3 mL Intravenous Q12H  . zolpidem  5 mg Oral QHS   Continuous Infusions: . sodium chloride 125 mL/hr at 01/18/15 2208   [facility], PA-S   Marzetta Board, MD [facility] Pager [phone]. If 7 PM - 7 AM, please contact night-coverage at [email or web address], password [facility] 01/19/2015, 1:03 PM  LOS: 1 day

## 2015-01-19 NOTE — Evaluation (Signed)
Occupational Therapy Evaluation Patient Details Name: Victor Davis MRN: [id number] DOB: 04/08/1941 Today's Date: 01/19/2015    History of Present Illness Pt is a 74 y.o. male with a PMH with HTN, hyperlipidemia, GERD, peptic ulcer, PVD, prostate CA 2008, ELS, CAD, emphysema. Pt presents with N/V and abdominal pain. He was found to have a distended gallbladder and was admitted for further medical management.    Clinical Impression   Pt admitted with above. Pt independent with ADLs, PTA. Feel pt will benefit from acute OT to increase activity tolerance and independence prior to d/c. Recommending HH at this time, but pt not wanting it.    Follow Up Recommendations  Home health OT;Supervision/Assistance - 24 hour    Equipment Recommendations  None recommended by OT    Recommendations for Other Services       Precautions / Restrictions Precautions Precautions: Fall Restrictions Weight Bearing Restrictions: No      Mobility Bed Mobility Overal bed mobility: Needs Assistance Bed Mobility: Supine to Sit     Supine to sit: Supervision     General bed mobility comments: cues for technique.  Transfers Overall transfer level: Needs assistance   Transfers: Sit to/from Stand Sit to Stand: Min guard         General transfer comment: Min guard for safety.    Balance  No LOB in session, however pt unsteady on feet. Balance not formally assessed.                                          ADL Overall ADL's : Needs assistance/impaired Eating/Feeding: Independent;Sitting   Grooming: Bed level;Standing;Oral care;Wash/dry face;Supervision/safety;Set up               Lower Body Dressing: Min guard;Sit to/from stand   Toilet Transfer: Min guard;Ambulation (chair/bed)           Functional mobility during ADLs: Min guard General ADL Comments: Educated on safety such as sitting for LB ADLs and recommended pt use his shower chair.      Vision      Perception     Praxis      Pertinent Vitals/Pain Pain Assessment: 0-10 Pain Score: 4  Pain Location: right side Pain Descriptors / Indicators: Aching Pain Intervention(s): Monitored during session     Hand Dominance     Extremity/Trunk Assessment Upper Extremity Assessment Upper Extremity Assessment: Overall WFL for tasks assessed   Lower Extremity Assessment Lower Extremity Assessment: Defer to PT evaluation       Communication Communication Communication: Expressive difficulties (difficult to understand-mumbles)   Cognition Arousal/Alertness: Lethargic Behavior During Therapy: WFL for tasks assessed/performed Overall Cognitive Status: No family/caregiver present to determine baseline cognitive functioning (decreased safety awareness)                     General Comments       Exercises       Shoulder Instructions      Home Living Family/patient expects to be discharged to:: Private residence Living Arrangements: Spouse/significant other Available Help at Discharge: Family;Available PRN/intermittently Type of Home: House       Home Layout: One level     Bathroom Shower/Tub: Teacher, early years/pre: Standard     Home Equipment: Environmental consultant - 2 wheels;Bedside commode;Shower seat          Prior Functioning/Environment Level of Independence: Independent  Comments: Pt reports he is independent and assists his wife in ADL's as she is currently ill (?cancer). Pt states he drives, does grocery shopping and cooking at home. Pt also reveals that sister-in-law and children occasionally come by to assist with pt's wife as well.     OT Diagnosis: Acute pain   OT Problem List: Decreased activity tolerance;Decreased knowledge of use of DME or AE;Decreased knowledge of precautions;Decreased safety awareness;Pain   OT Treatment/Interventions: Self-care/ADL training;DME and/or AE instruction;Therapeutic activities;Patient/family  education;Balance training;Cognitive remediation/compensation;Therapeutic exercise;Energy conservation    OT Goals(Current goals can be found in the care plan section) Acute Rehab OT Goals Patient Stated Goal: not stated OT Goal Formulation: With patient Time For Goal Achievement: 01/26/15 Potential to Achieve Goals: Good ADL Goals Pt Will Perform Lower Body Bathing: sit to/from stand;with set-up Pt Will Perform Lower Body Dressing: with set-up;sit to/from stand Pt Will Transfer to Toilet: with modified independence;ambulating Pt Will Perform Tub/Shower Transfer: Tub transfer;with supervision;ambulating;shower seat  OT Frequency: Min 2X/week   Barriers to D/C:            Co-evaluation              End of Session Equipment Utilized During Treatment: Gait belt  Activity Tolerance: Patient tolerated treatment well Patient left: in chair;with call bell/phone within reach;with chair alarm set   Time: 3524-8185 OT Time Calculation (min): 14 min Charges:  OT General Charges $OT Visit: 1 Procedure OT Evaluation $Initial OT Evaluation Tier I: 1 Procedure G-CodesBenito Mccreedy OTR/L [id number] 01/19/2015, 9:49 AM

## 2015-01-19 NOTE — Progress Notes (Signed)
Subjective: Pt doing well this AM s/p C-tube placement Did not eat yesterday b/c "food was too big." abd pain less  Objective: Vital signs in last 24 hours: Temp:  [97.8 F (36.6 C)-98.2 F (36.8 C)] 97.8 F (36.6 C) (06/07 0416) Pulse Rate:  [84-94] 92 (06/07 0416) Resp:  [8-17] 17 (06/07 0416) BP: (144-162)/(74-84) 144/74 mmHg (06/07 0416) SpO2:  [88 %-98 %] 98 % (06/07 0416) Last BM Date: 01/17/15  Intake/Output from previous day: 06/06 0701 - 06/07 0700 In: -  Out: 230 [Urine:30] Intake/Output this shift: Total I/O In: -  Out: 230 [Urine:30; Other:200]  General appearance: alert and cooperative GI: soft, nttp, no rebound/guarding, drain bilious  Lab Results:   Recent Labs  01/18/15 0315 01/19/15 0458  WBC 12.3* 15.0*  HGB 12.3* 12.1*  HCT 37.7* 37.2*  PLT 134* 125*   BMET  Recent Labs  01/17/15 1852 01/18/15 0315  NA 141 141  K 3.9 3.9  CL 104 105  CO2 24 26  GLUCOSE 147* 174*  BUN 24* 20  CREATININE 1.93* 1.53*  CALCIUM 9.4 8.8*   PT/INR  Recent Labs  01/18/15 0315  LABPROT 13.5  INR 1.01   ABG No results for input(s): PHART, HCO3 in the last 72 hours.  Invalid input(s): PCO2, PO2  Studies/Results: US Abdomen Complete  01/18/2015   CLINICAL DATA:  Patient with right-sided abdominal pain for 5 days.  EXAM: ULTRASOUND ABDOMEN COMPLETE  COMPARISON:  CT scan earlier today.  FINDINGS: Gallbladder: Distended with sludge. Wall thickness slightly greater than 4 mm. Negative sonographic Murphy's sign but the patient is reportedly heavily sedated.  Common bile duct: Diameter: 12 mm maximum, may taper distally to 7-9 mm.  Liver: No focal lesion identified. Within normal limits in parenchymal echogenicity.  IVC: No abnormality visualized.  Pancreas: Visualized portion unremarkable.  Spleen: Size and appearance within normal limits.  Right Kidney: Length: 10 cm. Hypoechoic lower pole cyst 2.3 x 1.5 x 1.9 cm  Left Kidney: Length: 10.3 cm. Hypoechoic  lower pole cyst measuring 3.4 x 2.4 x 3.2 cm  Abdominal aorta: No aneurysm visualized.  Other findings: None.  IMPRESSION: Gallbladder is distended with sludge displaying increasing wall thickness. Dilated common bile duct. Findings consistent with acute acalculous cholecystitis.  No definite common bile duct stones, although the increased diameter of the common bile duct suggests that there may be an ampullary obstruction.   Electronically Signed   By: Rolla Flatten M.D.   On: 01/18/2015 02:00   Ct Abdomen Pelvis W Contrast  01/17/2015   CLINICAL DATA:  74 year old male with right-sided abdominal pain for 4-5 days. Emesis.  EXAM: CT ABDOMEN AND PELVIS WITH CONTRAST  TECHNIQUE: Multidetector CT imaging of the abdomen and pelvis was performed using the standard protocol following bolus administration of intravenous contrast.  CONTRAST:  150m OMNIPAQUE IOHEXOL 300 MG/ML  SOLN  COMPARISON:  CT 04/08/2013  FINDINGS: Mild right greater than left basilar atelectasis. Dense coronary artery calcifications versus coronary stent. No pleural effusion.  The gallbladder is distended, there is question of mild pericholecystic soft tissue stranding. No calcified gallstones. There is prominence of the common bile duct measuring 10 mm at the porta hepatis. No focal hepatic lesion. The spleen, adrenal glands, and pancreas are unremarkable. No pancreatic ductal dilatation.  Complex cystic lesion in the lower pole the left kidney appears slightly decreased in size from prior exam, currently 3.4 x 3.4 cm, previously 4.3 x 4.8 cm. Thin peripheral enhancement again suspected medially. Additional scattered  simple cysts throughout both kidneys are unchanged. There is no hydronephrosis. There is symmetric enhancement and excretion on delayed phase imaging.  Moderate hiatal hernia. Stomach is physiologically distended. There are no dilated or thickened bowel loops. The appendix is normal. There is moderate stool throughout the colon with  multifocal colonic diverticula, most significant in the distal colon. No acute diverticulitis. No colonic wall thickening. No free air, free fluid, or intra-abdominal fluid collection.  No retroperitoneal adenopathy. Abdominal aorta is normal in caliber. Moderate to dense atherosclerosis of the abdominal aorta without aneurysm.  Within the pelvis the urinary bladder is physiologically distended. Cheek artifact from bilateral hip prostheses limits pelvic evaluation. Prostate gland is not evaluated, patient is post prostatectomy by report. Surgical clips in the bilateral external iliac stations.  Postsurgical change of the anterior abdominal wall with multiple tacks.  There are no acute or suspicious osseous abnormalities. There is multilevel degenerative change throughout the lumbar spine with both degenerative disc disease and facet arthropathy.  IMPRESSION: 1. Distended gallbladder with question of adjacent inflammatory change. Prominence of the common bile duct measuring 10 mm. Findings are concerning for acute cholecystitis and possible choledocholithiasis. Right upper quadrant ultrasound could be considered for further evaluation. 2. Extensive diverticulosis without acute diverticulitis. 3. Decreased size of complex left renal cyst from prior exam. Decreasing size suggests benign etiology.   Electronically Signed   By: Jeb Levering M.D.   On: 01/17/2015 23:38   Ir Perc Cholecystostomy  01/18/2015   CLINICAL DATA:  74 year old with right upper quadrant pain and suspected acute cholecystitis. Request for percutaneous cholecystostomy tube because not a good surgical candidate at this time.  EXAM: ULTRASOUND AND FLUOROSCOPIC GUIDED CHOLECYSTOSTOMY TUBE PLACEMENT  Physician: Stephan Minister. Henn, MD  FLUOROSCOPY TIME:  30 seconds, 13.75 mGy  MEDICATIONS AND MEDICAL HISTORY: 1 mg versed, 50 mcg fentanyl. A radiology nurse monitored the patient for moderate sedation.  ANESTHESIA/SEDATION: Moderate sedation time: 8  minutes  CONTRAST:  None  PROCEDURE: The procedure was explained to the patient. The risks and benefits of the procedure were discussed and the patient's questions were addressed. Informed consent was obtained from the patient. Patient was placed supine on the interventional table. Ultrasound demonstrated a distended gallbladder. The right upper abdomen was prepped and draped in sterile fashion. Maximal barrier sterile technique was utilized including caps, mask, sterile gowns, sterile gloves, sterile drape, hand hygiene and skin antiseptic. Skin and soft tissues were anesthetized with 1% lidocaine. Using ultrasound guidance, a 21 gauge needle was directed into the gallbladder from a transhepatic approach. Needle position was confirmed within the gallbladder with ultrasound. Dark bilious fluid immediately started draining from the needle hub. A 0.018 wire was advanced into the gallbladder with ultrasound guidance. An Accustick dilator set was placed under fluoroscopy. The tract was dilated to accommodate a 10.2 [place] multipurpose drain. Greater than 100 mL of dark fluid was aspirated from the gallbladder. The gallbladder was decompressed at the end of the procedure. Catheter was sutured to the skin. Catheter was attached to a gravity bag. Sterile dressing was placed. Fluoroscopic and ultrasound images were taken and saved for documentation. Fluid sample was sent for culture.  FINDINGS: Distended gallbladder by ultrasound. Greater than 100 mL of dark bilious fluid was removed from the gallbladder. Tube position confirmed within the gallbladder with ultrasound.  Estimated blood loss: Minimal  COMPLICATIONS: None  IMPRESSION: Successful cholecystostomy tube placement with ultrasound and fluoroscopic guidance.   Electronically Signed   By: Scherrie Gerlach.D.  On: 01/18/2015 16:00    Anti-infectives: Anti-infectives    Start     Dose/Rate Route Frequency Ordered Stop   01/18/15 0800  piperacillin-tazobactam (ZOSYN)  IVPB 3.375 g     3.375 g 12.5 mL/hr over 240 Minutes Intravenous Every 8 hours 01/18/15 0257     01/18/15 0300  piperacillin-tazobactam (ZOSYN) IVPB 3.375 g     3.375 g 100 mL/hr over 30 Minutes Intravenous  Once 01/18/15 0257 01/18/15 0524      Assessment/Plan: Acute cholecystitis Principal Problem:   Abdominal pain Active Problems:   HLD (hyperlipidemia)   RESTLESS LEG SYNDROME   Peripheral vascular disease   Osteoarthritis   PROSTATE CANCER, HX OF   CAD (coronary artery disease)   Hypertension   Pulmonary emphysema   Cholecystitis   Acute on chronic kidney failure   Acute cholecystitis   Pt appears to have less abd pain this AM post proc.  Would rec decreasing diet to soft Con't abx  LOS: 1 day    Rosario Jacks., Anne Hahn 01/19/2015

## 2015-01-19 NOTE — Progress Notes (Signed)
Referring Physician(s): CCS  Subjective:  Perc chole drain placed 6/6 Feeling better this am Chole drain draining well  Allergies: Amitriptyline hcl; Diphenhydramine hcl; and Simvastatin  Medications: Prior to Admission medications   Medication Sig Start Date End Date Taking? Authorizing Provider  alum & mag hydroxide-simeth (MAALOX/MYLANTA) 200-200-20 MG/5ML suspension Take 30 mLs by mouth every 6 (six) hours as needed for indigestion or heartburn.   Yes Historical Provider, MD  amLODipine (NORVASC) 10 MG tablet Take 1 tablet (10 mg total) by mouth daily. 10/16/14  Yes Biagio Borg, MD  aspirin 81 MG EC tablet Take 81 mg by mouth daily.     Yes Historical Provider, MD  citalopram (CELEXA) 40 MG tablet Take 1 tablet (40 mg total) by mouth daily. 02/03/14  Yes Biagio Borg, MD  cloNIDine (CATAPRES) 0.1 MG tablet Take 1 tablet by mouth every AM and 2 tablets by mouth every PM 04/21/14  Yes Biagio Borg, MD  clopidogrel (PLAVIX) 75 MG tablet Take 1 tablet (75 mg total) by mouth daily. 10/16/14  Yes Burnell Blanks, MD  ibuprofen (ADVIL,MOTRIN) 200 MG tablet Take 400 mg by mouth every 6 (six) hours as needed. pain   Yes Historical Provider, MD  isosorbide mononitrate (IMDUR) 30 MG 24 hr tablet TAKE 1 TABLET (30 MG TOTAL) BY MOUTH DAILY. 09/10/14  Yes Burnell Blanks, MD  loratadine (CLARITIN) 10 MG tablet Take 10 mg by mouth daily.   Yes Historical Provider, MD  nitroGLYCERIN (NITROSTAT) 0.4 MG SL tablet Place 1 tablet (0.4 mg total) under the tongue every 5 (five) minutes as needed for chest pain. 08/20/14 10/06/15 Yes Burnell Blanks, MD  pantoprazole (PROTONIX) 40 MG tablet Take 1 tablet (40 mg total) by mouth daily. 08/20/14  Yes Burnell Blanks, MD  pramipexole (MIRAPEX) 1 MG tablet TAKE 1 TABLET (1 MG TOTAL) BY MOUTH 3 (THREE) TIMES DAILY. Patient taking differently: TAKE 1 TABLET (1 MG TOTAL) BY MOUTH TWICE DAILY 06/22/14  Yes Biagio Borg, MD  zolpidem (AMBIEN) 5  MG tablet Take 1 tablet (5 mg total) by mouth at bedtime as needed for sleep. Patient taking differently: Take 5 mg by mouth at bedtime.  02/20/12 01/17/15 Yes Biagio Borg, MD  isosorbide mononitrate (IMDUR) 30 MG 24 hr tablet TAKE 1 TABLET (30 MG TOTAL) BY MOUTH DAILY. 12/28/14   Burnell Blanks, MD  lovastatin (MEVACOR) 40 MG tablet TAKE 1 TABLET BY MOUTH AT BEDTIME 01/18/15   Burnell Blanks, MD     Vital Signs: BP 144/74 mmHg  Pulse 92  Temp(Src) 97.8 F (36.6 C) (Oral)  Resp 17  Ht '5\' 7"'$  (1.702 m)  Wt 69.3 kg (152 lb 12.5 oz)  BMI 23.92 kg/m2  SpO2 98%  Physical Exam  Abdominal: Soft.  Site of chole drain is NT, no bleeding draining bilious fluid 200 cc recorded 150 cc in bag  Abd NT  Nursing note and vitals reviewed.   Imaging: US Abdomen Complete  01/18/2015   CLINICAL DATA:  Patient with right-sided abdominal pain for 5 days.  EXAM: ULTRASOUND ABDOMEN COMPLETE  COMPARISON:  CT scan earlier today.  FINDINGS: Gallbladder: Distended with sludge. Wall thickness slightly greater than 4 mm. Negative sonographic Murphy's sign but the patient is reportedly heavily sedated.  Common bile duct: Diameter: 12 mm maximum, may taper distally to 7-9 mm.  Liver: No focal lesion identified. Within normal limits in parenchymal echogenicity.  IVC: No abnormality visualized.  Pancreas: Visualized  portion unremarkable.  Spleen: Size and appearance within normal limits.  Right Kidney: Length: 10 cm. Hypoechoic lower pole cyst 2.3 x 1.5 x 1.9 cm  Left Kidney: Length: 10.3 cm. Hypoechoic lower pole cyst measuring 3.4 x 2.4 x 3.2 cm  Abdominal aorta: No aneurysm visualized.  Other findings: None.  IMPRESSION: Gallbladder is distended with sludge displaying increasing wall thickness. Dilated common bile duct. Findings consistent with acute acalculous cholecystitis.  No definite common bile duct stones, although the increased diameter of the common bile duct suggests that there may be an ampullary  obstruction.   Electronically Signed   By: Rolla Flatten M.D.   On: 01/18/2015 02:00   Ct Abdomen Pelvis W Contrast  01/17/2015   CLINICAL DATA:  73 year old male with right-sided abdominal pain for 4-5 days. Emesis.  EXAM: CT ABDOMEN AND PELVIS WITH CONTRAST  TECHNIQUE: Multidetector CT imaging of the abdomen and pelvis was performed using the standard protocol following bolus administration of intravenous contrast.  CONTRAST:  175m OMNIPAQUE IOHEXOL 300 MG/ML  SOLN  COMPARISON:  CT 04/08/2013  FINDINGS: Mild right greater than left basilar atelectasis. Dense coronary artery calcifications versus coronary stent. No pleural effusion.  The gallbladder is distended, there is question of mild pericholecystic soft tissue stranding. No calcified gallstones. There is prominence of the common bile duct measuring 10 mm at the porta hepatis. No focal hepatic lesion. The spleen, adrenal glands, and pancreas are unremarkable. No pancreatic ductal dilatation.  Complex cystic lesion in the lower pole the left kidney appears slightly decreased in size from prior exam, currently 3.4 x 3.4 cm, previously 4.3 x 4.8 cm. Thin peripheral enhancement again suspected medially. Additional scattered simple cysts throughout both kidneys are unchanged. There is no hydronephrosis. There is symmetric enhancement and excretion on delayed phase imaging.  Moderate hiatal hernia. Stomach is physiologically distended. There are no dilated or thickened bowel loops. The appendix is normal. There is moderate stool throughout the colon with multifocal colonic diverticula, most significant in the distal colon. No acute diverticulitis. No colonic wall thickening. No free air, free fluid, or intra-abdominal fluid collection.  No retroperitoneal adenopathy. Abdominal aorta is normal in caliber. Moderate to dense atherosclerosis of the abdominal aorta without aneurysm.  Within the pelvis the urinary bladder is physiologically distended. Cheek artifact  from bilateral hip prostheses limits pelvic evaluation. Prostate gland is not evaluated, patient is post prostatectomy by report. Surgical clips in the bilateral external iliac stations.  Postsurgical change of the anterior abdominal wall with multiple tacks.  There are no acute or suspicious osseous abnormalities. There is multilevel degenerative change throughout the lumbar spine with both degenerative disc disease and facet arthropathy.  IMPRESSION: 1. Distended gallbladder with question of adjacent inflammatory change. Prominence of the common bile duct measuring 10 mm. Findings are concerning for acute cholecystitis and possible choledocholithiasis. Right upper quadrant ultrasound could be considered for further evaluation. 2. Extensive diverticulosis without acute diverticulitis. 3. Decreased size of complex left renal cyst from prior exam. Decreasing size suggests benign etiology.   Electronically Signed   By: MJeb LeveringM.D.   On: 01/17/2015 23:38   Ir Perc Cholecystostomy  01/18/2015   CLINICAL DATA:  74year old with right upper quadrant pain and suspected acute cholecystitis. Request for percutaneous cholecystostomy tube because not a good surgical candidate at this time.  EXAM: ULTRASOUND AND FLUOROSCOPIC GUIDED CHOLECYSTOSTOMY TUBE PLACEMENT  Physician: AStephan Minister HAnselm Pancoast MD  FLUOROSCOPY TIME:  30 seconds, 13.75 mGy  MEDICATIONS AND MEDICAL HISTORY: 1  mg versed, 50 mcg fentanyl. A radiology nurse monitored the patient for moderate sedation.  ANESTHESIA/SEDATION: Moderate sedation time: 8 minutes  CONTRAST:  None  PROCEDURE: The procedure was explained to the patient. The risks and benefits of the procedure were discussed and the patient's questions were addressed. Informed consent was obtained from the patient. Patient was placed supine on the interventional table. Ultrasound demonstrated a distended gallbladder. The right upper abdomen was prepped and draped in sterile fashion. Maximal barrier  sterile technique was utilized including caps, mask, sterile gowns, sterile gloves, sterile drape, hand hygiene and skin antiseptic. Skin and soft tissues were anesthetized with 1% lidocaine. Using ultrasound guidance, a 21 gauge needle was directed into the gallbladder from a transhepatic approach. Needle position was confirmed within the gallbladder with ultrasound. Dark bilious fluid immediately started draining from the needle hub. A 0.018 wire was advanced into the gallbladder with ultrasound guidance. An Accustick dilator set was placed under fluoroscopy. The tract was dilated to accommodate a 10.2 [place] multipurpose drain. Greater than 100 mL of dark fluid was aspirated from the gallbladder. The gallbladder was decompressed at the end of the procedure. Catheter was sutured to the skin. Catheter was attached to a gravity bag. Sterile dressing was placed. Fluoroscopic and ultrasound images were taken and saved for documentation. Fluid sample was sent for culture.  FINDINGS: Distended gallbladder by ultrasound. Greater than 100 mL of dark bilious fluid was removed from the gallbladder. Tube position confirmed within the gallbladder with ultrasound.  Estimated blood loss: Minimal  COMPLICATIONS: None  IMPRESSION: Successful cholecystostomy tube placement with ultrasound and fluoroscopic guidance.   Electronically Signed   By: Markus Daft M.D.   On: 01/18/2015 16:00    Labs:  CBC:  Recent Labs  10/20/14 1013 01/17/15 1852 01/18/15 0315 01/19/15 0458  WBC 7.8 12.8* 12.3* 15.0*  HGB 12.4* 13.1 12.3* 12.1*  HCT 36.5* 39.7 37.7* 37.2*  PLT 179.0 162 134* 125*    COAGS:  Recent Labs  01/18/15 0315  INR 1.01  APTT 27    BMP:  Recent Labs  10/20/14 1013 01/17/15 1852 01/18/15 0315 01/19/15 0458  NA 139 141 141 141  K 4.7 3.9 3.9 3.9  CL 107 104 105 107  CO2 '28 24 26 25  '$ GLUCOSE 109* 147* 174* 145*  BUN 26* 24* 20 23*  CALCIUM 9.3 9.4 8.8* 8.7*  CREATININE 1.59* 1.93* 1.53* 1.71*   GFRNONAA  --  32* 43* 37*  GFRAA  --  37* 50* 43*    LIVER FUNCTION TESTS:  Recent Labs  10/20/14 1013 01/17/15 1852 01/18/15 0315 01/19/15 0458  BILITOT 0.7 0.6 1.1 1.8*  AST '12 27 26 30  '$ ALT '10 24 22 28  '$ ALKPHOS 73 87 78 90  PROT 6.2 7.1 6.5 5.5*  ALBUMIN 3.9 3.9 3.7 2.7*    Assessment and Plan:  Perc chole drain intact Remains in place till OR or 6-8 weeks Plan per CCS  Signed: Keivon Garden A 01/19/2015, 9:26 AM   I spent a total of 15 Minutes in face to face in clinical consultation/evaluation, greater than 50% of which was counseling/coordinating care for perc chole drain

## 2015-01-20 DIAGNOSIS — N189 Chronic kidney disease, unspecified: Secondary | ICD-10-CM

## 2015-01-20 DIAGNOSIS — K810 Acute cholecystitis: Principal | ICD-10-CM

## 2015-01-20 DIAGNOSIS — N179 Acute kidney failure, unspecified: Secondary | ICD-10-CM

## 2015-01-20 DIAGNOSIS — K819 Cholecystitis, unspecified: Secondary | ICD-10-CM

## 2015-01-20 DIAGNOSIS — R1031 Right lower quadrant pain: Secondary | ICD-10-CM

## 2015-01-20 LAB — COMPREHENSIVE METABOLIC PANEL
ALBUMIN: 2.4 g/dL — AB (ref 3.5–5.0)
ALK PHOS: 92 U/L (ref 38–126)
ALT: 21 U/L (ref 17–63)
ANION GAP: 8 (ref 5–15)
AST: 18 U/L (ref 15–41)
BILIRUBIN TOTAL: 1.2 mg/dL (ref 0.3–1.2)
BUN: 26 mg/dL — ABNORMAL HIGH (ref 6–20)
CHLORIDE: 106 mmol/L (ref 101–111)
CO2: 24 mmol/L (ref 22–32)
CREATININE: 1.77 mg/dL — AB (ref 0.61–1.24)
Calcium: 8.5 mg/dL — ABNORMAL LOW (ref 8.9–10.3)
GFR calc Af Amer: 42 mL/min — ABNORMAL LOW (ref >60)
GFR, EST NON AFRICAN AMERICAN: 36 mL/min — AB (ref >60)
Glucose, Bld: 124 mg/dL — ABNORMAL HIGH (ref 65–99)
POTASSIUM: 3.6 mmol/L (ref 3.5–5.1)
SODIUM: 138 mmol/L (ref 135–145)
Total Protein: 5.6 g/dL — ABNORMAL LOW (ref 6.5–8.1)

## 2015-01-20 LAB — CBC
HEMATOCRIT: 33.5 % — AB (ref 39.0–52.0)
Hemoglobin: 10.8 g/dL — ABNORMAL LOW (ref 13.0–17.0)
MCH: 28.3 pg (ref 26.0–34.0)
MCHC: 32.2 g/dL (ref 30.0–36.0)
MCV: 87.9 fL (ref 78.0–100.0)
Platelets: 112 10*3/uL — ABNORMAL LOW (ref 150–400)
RBC: 3.81 MIL/uL — ABNORMAL LOW (ref 4.22–5.81)
RDW: 13.4 % (ref 11.5–15.5)
WBC: 15.1 10*3/uL — ABNORMAL HIGH (ref 4.0–10.5)

## 2015-01-20 LAB — GLUCOSE, CAPILLARY: GLUCOSE-CAPILLARY: 121 mg/dL — AB (ref 65–99)

## 2015-01-20 MED ORDER — AMOXICILLIN-POT CLAVULANATE ER 1000-62.5 MG PO TB12
2.0000 | ORAL_TABLET | Freq: Two times a day (BID) | ORAL | Status: DC
  Administered 2015-01-20 – 2015-01-21 (×2): 2 via ORAL
  Filled 2015-01-20 (×3): qty 2

## 2015-01-20 NOTE — Progress Notes (Signed)
PROGRESS NOTE  Victor Davis QRF:[id number] DOB: 1941/03/25 DOA: 01/17/2015 PCP: Cathlean Cower, MD   HPI: Victor Davis is a 74 y.o. Male with a history of HTN, HLD, GERD, PUD, PVD, CAD, pulmonary emphysema, sleep disorder and prostate cancer who presented to the ED 01/17/15 with right-sided abdominal pain, vomiting, and occasional dark stool for 5 days. An abdominal ultrasound confirmed a distended gallbladder with wall-thickening and sludge.   Subjective / 24 H Interval events -Patient appears confused at times, disoriented, having unsteady gait. Per physical therapy appears weaker today compared to yesterday's evaluation  Assessment/Plan: Principal Problem:   Abdominal pain Active Problems:   HLD (hyperlipidemia)   RESTLESS LEG SYNDROME   Peripheral vascular disease   Osteoarthritis   PROSTATE CANCER, HX OF   CAD (coronary artery disease)   Hypertension   Pulmonary emphysema   Cholecystitis   Acute on chronic kidney failure   Acute cholecystitis  Acute acalculous cholecystitis - percutaneous chole drain placed yesterday per IR - draining well  - to remain in place until OR or 6-8 weeks - appreciate IR follow-up - Cultures growing abundant gram-negative rods, discontinue Zosyn and transitioned to Augmentin.   AKI on CKD - baseline Cr 1.3 - 1.9 on admission >> 1.5 >> 1.7, overall stable  Disposition -Patient having significant deconditioning, case discussed with physical therapy feeling that he would benefit from rehabilitation at skilled nursing facility. Social worker consult placed   Diet: DIET SOFT Room service appropriate?: Yes; Fluid consistency:: Thin Fluids: NS DVT Prophylaxis: Lovenox  Code Status: Full Code Family Communication: I spoke to his sister-in-law over telephone conversation today Disposition Plan: Spoke with his sister-in-law along with physical therapy, feel that patient would benefit from rehabilitation at skilled nursing facility.    Consultants:  Surgery  IR  Procedures:  Percutaneous Cholecystostomy   Antibiotics Zosyn 6/5 >>   Studies  US Abdomen Complete 01/18/2015 Gallbladder is distended with sludge displaying increasing wall thickness. Dilated common bile duct. Findings consistent with acute acalculous cholecystitis.  No definite common bile duct stones, although the increased diameter of the common bile duct suggests that there may be an ampullary obstruction.     Ct Abdomen Pelvis W Contrast 01/17/2015 1. Distended gallbladder with question of adjacent inflammatory change. Prominence of the common bile duct measuring 10 mm. Findings are concerning for acute cholecystitis and possible choledocholithiasis. Right upper quadrant ultrasound could be considered for further evaluation. 2. Extensive diverticulosis without acute diverticulitis. 3. Decreased size of complex left renal cyst from prior exam. Decreasing size suggests benign etiology.    Ir Perc Cholecystostomy 01/18/2015  Successful cholecystostomy tube placement with ultrasound and fluoroscopic guidance.   Objective  Filed Vitals:   01/19/15 1316 01/19/15 1954 01/20/15 0423 01/20/15 1352  BP: 117/54 140/62 151/79 150/68  Pulse: 79 86 91 82  Temp: 98.8 F (37.1 C) 98.4 F (36.9 C) 98.3 F (36.8 C) 98.3 F (36.8 C)  TempSrc: Axillary Oral Oral Oral  Resp: _0 Height:      Weight:      SpO2: 92% 96% 97% 98%    Intake/Output Summary (Last 24 hours) at 01/20/15 1751 Last data filed at 01/20/15 1230  Gross per 24 hour  Intake    480 ml  Output   1430 ml  Net   -950 ml   Filed Weights   01/18/15 0230  Weight: 69.3 kg (152 lb 12.5 oz)    Exam:  General:  NAD, resting in bed,  drowsy, breakfast plate in front of him  HEENT: no scleral icterus noted  Cardiovascular: RRR without MRG, 2+ peripheral pulses, no edema  Respiratory: CTA biL, good air movement, no wheezing, no crackles, no rales  Abdomen: soft, tender around PTC  site, PTC bag with dark yellow fluid  MSK/Extremities: no clubbing/cyanosis, no joint swelling  Data Reviewed: Basic Metabolic Panel:  Recent Labs Lab 01/17/15 1852 01/18/15 0315 01/19/15 0458 01/20/15 0500  NA 141 141 141 138  K 3.9 3.9 3.9 3.6  CL 104 105 107 106  CO2 _0 GLUCOSE 147* 174* 145* 124*  BUN 24* 20 23* 26*  CREATININE 1.93* 1.53* 1.71* 1.77*  CALCIUM 9.4 8.8* 8.7* 8.5*   Liver Function Tests:  Recent Labs Lab 01/17/15 1852 01/18/15 0315 01/19/15 0458 01/20/15 0500  AST _1 ALT _2 ALKPHOS 87 78 90 92  BILITOT 0.6 1.1 1.8* 1.2  PROT 7.1 6.5 5.5* 5.6*  ALBUMIN 3.9 3.7 2.7* 2.4*    Recent Labs Lab 01/17/15 1852  LIPASE 16*   CBC:  Recent Labs Lab 01/17/15 1852 01/18/15 0315 01/19/15 0458 01/20/15 0500  WBC 12.8* 12.3* 15.0* 15.1*  NEUTROABS 9.7*  --   --   --   HGB 13.1 12.3* 12.1* 10.8*  HCT 39.7 37.7* 37.2* 33.5*  MCV 87.4 87.1 87.9 87.9  PLT 162 134* 125* 112*   CBG:  Recent Labs Lab 01/18/15 0626 01/19/15 0605 01/20/15 0601  GLUCAP 157* 144* 121*    Recent Results (from the past 240 hour(s))  Culture, blood (x 2)     Status: None (Preliminary result)   Collection Time: 01/18/15  3:15 AM  Result Value Ref Range Status   Specimen Description BLOOD LEFT ANTECUBITAL  Final   Special Requests BOTTLES DRAWN AEROBIC AND ANAEROBIC 5CC  Final   Culture   Final           BLOOD CULTURE RECEIVED NO GROWTH TO DATE CULTURE WILL BE HELD FOR 5 DAYS BEFORE ISSUING A FINAL NEGATIVE REPORT Performed at [facility]    Report Status PENDING  Incomplete  Culture, blood (x 2)     Status: None (Preliminary result)   Collection Time: 01/18/15  3:30 AM  Result Value Ref Range Status   Specimen Description BLOOD RIGHT ANTECUBITAL  Final   Special Requests BOTTLES DRAWN AEROBIC AND ANAEROBIC 5CC  Final   Culture   Final           BLOOD CULTURE RECEIVED NO GROWTH TO DATE CULTURE WILL BE HELD FOR 5 DAYS BEFORE  ISSUING A FINAL NEGATIVE REPORT Performed at [facility]    Report Status PENDING  Incomplete  BODY FLUID CULTURE     Status: None (Preliminary result)   Collection Time: 01/18/15  3:48 PM  Result Value Ref Range Status   Specimen Description FLUID BILE  Final   Special Requests NONE  Final   Gram Stain   Final    NO WBC SEEN RARE GRAM NEGATIVE RODS Performed at [facility]    Culture   Final    ABUNDANT GRAM NEGATIVE RODS Performed at [facility]    Report Status PENDING  Incomplete     Scheduled Meds: . amLODipine  10 mg Oral Daily  . aspirin EC  81 mg Oral Daily  . citalopram  20 mg Oral Daily  . cloNIDine  0.1 mg Oral BID  . enoxaparin (LOVENOX) injection  40 mg Subcutaneous Q24H  . isosorbide mononitrate  30 mg Oral Daily  . loratadine  10 mg Oral Daily  . pantoprazole (PROTONIX) IV  40 mg Intravenous Q24H  . piperacillin-tazobactam (ZOSYN)  IV  3.375 g Intravenous Q8H  . pramipexole  1 mg Oral TID  . sodium chloride  3 mL Intravenous Q12H  . zolpidem  5 mg Oral QHS   Continuous Infusions: . sodium chloride 125 mL/hr at 01/19/15 2158

## 2015-01-20 NOTE — Care Management Note (Addendum)
Case Management Note  Patient Details  Name: KAMAU WEATHERALL MRN: [id number] Date of Birth: 03-Aug-1941  Subjective/Objective:        Pt admitted with 5 day c/o right upper quadrant abdominal pain            Action/Plan:  Pt will have drain placed by IR left in place for approximately 6 weeks, then he will have an interval choecystectomy.  CM will continue to monitor for disposition needs.   Expected Discharge Date:                  Expected Discharge Plan:  [place]  In-House Referral:     Discharge planning Services  CM Consult  Post Acute Care Choice:    Choice offered to:     DME Arranged:  [facility] Agency:  [place] Arranged:  RN, PT, OTRegistered Nurse (drain care), Physical Therapist, Occupational Therapist [place] Agency:  [place]  Status of Service: Complete, will sign off  Medicare Important Message Given:  Yes Date Medicare IM Given:  01/18/15 Medicare IM give by:  Elenor Quinones Date Additional Medicare IM Given:  01/20/15 Additional Medicare Important Message give by:  [place]  If discussed at Long Length of Stay Meetings, dates discussed:    Additional Comments: 01/19/15 Elenor Quinones, RN, BSN [phone] Therapy reassessed pt this am and recommendations remain the same. MD agreed with PT/OT recommendations with the addition of RN to manage drain care, orders will be written. CM was contacted by [place] and informed that [facility] would not be able to start before a week post discharge.  CM contacted wife and offered choice, pt chose [place].  Pt's wife was admitted to [facility] 6/6 evening and is unable to provide supervision for pt.  Wife has made arrangements with her sister Adolphus Birchwood for pt to stay post discharge.  CM communicated 24 hour recommendation; Ms Verl Blalock works during the day however grand daughter and neighbor  will provide  supervision for pt during day hours.   CM contacted Adolphus Birchwood, Ms Verl Blalock verified that pt would reside with her initially post discharge and agreed to [facility] services coming into her home.  Adolphus Birchwood ( mobile [phone], home [phone])  Address: [address] [place] [address].  [facility] contacted, referral accepted, address and phone numbers of Adolphus Birchwood provided to agency.  Adolphus Birchwood will pick pt up from hospital and requested to be contacted shortly prior to discharge.  CM will communicate request to bedside nurse and place sticky note on summary tab.  No additional CM needs    01/18/15 Elenor Quinones, RN, BSN [phone] CM spoke with pt regarding [place] services, pt instructed nurse to ask wife.  Wife contacted and stated she would prefer [place] as she is already receiving [place] with agency.  CM contacted [place].    Maryclare Labrador, RN 01/20/2015, 11:50 AM

## 2015-01-20 NOTE — Progress Notes (Addendum)
Occupational Therapy Treatment Patient Details Name: Victor Davis MRN: [id number] DOB: 1941/02/26 Today's Date: 01/20/2015    History of present illness Pt is a 74 y.o. male with a PMH with HTN, hyperlipidemia, GERD, peptic ulcer, PVD, prostate CA 2008, ELS, CAD, emphysema. Pt presents with N/V and abdominal pain. He was found to have a distended gallbladder and was admitted for further medical management.    OT comments  Pt progressing. Continue to recommend [place] upon d/c. Feel pt will continue to benefit from acute OT to increase independence and safety prior to d/c.  Follow Up Recommendations  Home health OT;Supervision/Assistance - 24 hour    Equipment Recommendations  None recommended by OT    Recommendations for Other Services      Precautions / Restrictions Precautions Precautions: Fall Restrictions Weight Bearing Restrictions: No       Mobility Bed Mobility Overal bed mobility: Needs Assistance Bed Mobility: Supine to Sit     Supine to sit: Supervision        Transfers Overall transfer level: Needs assistance   Transfers: Sit to/from Stand Sit to Stand: Min guard              Balance  Min guard for ambulation. Balance not formally assessed.                                 ADL Overall ADL's : Needs assistance/impaired             Lower Body Bathing: Set up;Supervison/ safety (standing) Lower Body Bathing Details (indicate cue type and reason): washed bottom     Lower Body Dressing: Minimal assistance;Sit to/from stand   Toilet Transfer: Ambulation;Regular Toilet;Min guard   Toileting- Water quality scientist and Hygiene: Supervision/safety;Sit to/from stand   Tub/ Shower Transfer: Tub transfer;Min guard;Ambulation   Functional mobility during ADLs: Min guard General ADL Comments: Pt practiced simulated tub transfer. OT assisted in helping pt don briefs.       Vision                     Perception     Praxis       Cognition  Awake/Alert (lethargic at beginning) Behavior During Therapy: [facility] for tasks assessed/performed Overall Cognitive Status: No family/caregiver present to determine baseline cognitive functioning (decreased safety awareness)                       Extremity/Trunk Assessment               Exercises     Shoulder Instructions       General Comments      Pertinent Vitals/ Pain       Pain Assessment: 0-10 Pain Score: 7  Pain Location: right side Pain Descriptors / Indicators: Sore;Other (Comment) (pulling) Pain Intervention(s): Monitored during session;Repositioned   Pt on RA for most of session. Pulse oximeter reading in 80s when pt in bathroom, but checked right after on other hand, and it was in 90s. Placed pt back on O2 towards end of session. Home Living                                          Prior Functioning/Environment              Frequency Min 2X/week  Progress Toward Goals  OT Goals(current goals can now be found in the care plan section)  Progress towards OT goals: Progressing toward goals  Acute Rehab OT Goals Patient Stated Goal: not stated OT Goal Formulation: With patient Time For Goal Achievement: 01/26/15 Potential to Achieve Goals: Good ADL Goals Pt Will Perform Lower Body Bathing: sit to/from stand;with set-up Pt Will Perform Lower Body Dressing: with set-up;sit to/from stand Pt Will Transfer to Toilet: with modified independence;ambulating Pt Will Perform Tub/Shower Transfer: Tub transfer;with supervision;ambulating;shower seat  Plan Discharge plan remains appropriate    Co-evaluation                 End of Session Equipment Utilized During Treatment: Gait belt   Activity Tolerance Patient tolerated treatment well   Patient Left in chair;with call bell/phone within reach;with chair alarm set   Nurse Communication Other (comment) (O2 sats; chair alarm set)        Time:  0630-1601 OT Time Calculation (min): 18 min  Charges: OT General Charges $OT Visit: 1 Procedure OT Treatments $Self Care/Home Management : 8-22 mins   Benito Mccreedy OTR/L [phone] 01/20/2015, 11:41 AM

## 2015-01-20 NOTE — Progress Notes (Signed)
Patient ID: Victor Davis, male   DOB: [birth year], 74 y.o.   MRN: [id number]     [facility]      [address] [place], [place] [address]    Phone: [phone] FAX: [phone]     Subjective: States abdomen is "sore" when he "reaches too much."  No pain with or after PO intake.  Afebrile.  WBC 15k.  LFTs have normalized.   Objective:  Vital signs:  Filed Vitals:   01/19/15 0959 01/19/15 1316 01/19/15 1954 01/20/15 0423  BP: 154/71 117/54 140/62 151/79  Pulse:  79 86 91  Temp:  98.8 F (37.1 C) 98.4 F (36.9 C) 98.3 F (36.8 C)  TempSrc:  Axillary Oral Oral  Resp:  16  18  Height:      Weight:      SpO2:  92% 96% 97%    Last BM Date: 01/17/15  Intake/Output   Yesterday:  06/07 0701 - 06/08 0700 In: -  Out: 6301 [SWFUX:3235] This shift:    I/O last 3 completed shifts: In: -  Out: 1810 [Urine:1460; Other:350]    Physical Exam: General: Pt awake/alert/oriented x4 in no acute distress  Abdomen: Soft.  Nondistended.  Non tender.  Bilious output from perc gb drain.  No evidence of peritonitis.  No incarcerated hernias.    Problem List:   Principal Problem:   Abdominal pain Active Problems:   HLD (hyperlipidemia)   RESTLESS LEG SYNDROME   Peripheral vascular disease   Osteoarthritis   PROSTATE CANCER, HX OF   CAD (coronary artery disease)   Hypertension   Pulmonary emphysema   Cholecystitis   Acute on chronic kidney failure   Acute cholecystitis    Results:   Labs: Results for orders placed or performed during the hospital encounter of 01/17/15 (from the past 48 hour(s))  Lactic acid, plasma     Status: None   Collection Time: 01/18/15 10:35 AM  Result Value Ref Range   Lactic Acid, Venous 1.3 0.5 - 2.0 mmol/L  BODY FLUID CULTURE     Status: None (Preliminary result)   Collection Time: 01/18/15  3:48 PM  Result Value Ref Range   Specimen Description FLUID BILE    Special Requests NONE    Gram  Stain      NO WBC SEEN RARE GRAM NEGATIVE RODS Performed at [facility]    Culture      Culture reincubated for better growth Performed at [facility]    Report Status PENDING   Creatinine, urine, random     Status: None   Collection Time: 01/18/15  8:14 PM  Result Value Ref Range   Creatinine, Urine 132.50 mg/dL  Urea nitrogen, urine     Status: None   Collection Time: 01/18/15  8:14 PM  Result Value Ref Range   Urea Nitrogen, Ur 747 Not Estab. mg/dL    Comment: (NOTE) Performed At: [facility] [place], [place] [id number] Lindon Romp MD YH:[id number]   Basic metabolic panel     Status: Abnormal   Collection Time: 01/19/15  4:58 AM  Result Value Ref Range   Sodium 141 135 - 145 mmol/L   Potassium 3.9 3.5 - 5.1 mmol/L   Chloride 107 101 - 111 mmol/L   CO2 25 22 - 32 mmol/L   Glucose, Bld 145 (H) 65 - 99 mg/dL   BUN 23 (H) 6 - 20 mg/dL   Creatinine, Ser  1.71 (H) 0.61 - 1.24 mg/dL   Calcium 8.7 (L) 8.9 - 10.3 mg/dL   GFR calc non Af Amer 37 (L) >60 mL/min   GFR calc Af Amer 43 (L) >60 mL/min    Comment: (NOTE) The eGFR has been calculated using the CKD EPI equation. This calculation has not been validated in all clinical situations. eGFR's persistently <60 mL/min signify possible Chronic Kidney Disease.    Anion gap 9 5 - 15  CBC     Status: Abnormal   Collection Time: 01/19/15  4:58 AM  Result Value Ref Range   WBC 15.0 (H) 4.0 - 10.5 K/uL   RBC 4.23 4.22 - 5.81 MIL/uL   Hemoglobin 12.1 (L) 13.0 - 17.0 g/dL   HCT 37.2 (L) 39.0 - 52.0 %   MCV 87.9 78.0 - 100.0 fL   MCH 28.6 26.0 - 34.0 pg   MCHC 32.5 30.0 - 36.0 g/dL   RDW 13.5 11.5 - 15.5 %   Platelets 125 (L) 150 - 400 K/uL  Hepatic function panel     Status: Abnormal   Collection Time: 01/19/15  4:58 AM  Result Value Ref Range   Total Protein 5.5 (L) 6.5 - 8.1 g/dL   Albumin 2.7 (L) 3.5 - 5.0 g/dL   AST 30 15 - 41 U/L   ALT 28 17 - 63 U/L   Alkaline  Phosphatase 90 38 - 126 U/L   Total Bilirubin 1.8 (H) 0.3 - 1.2 mg/dL   Bilirubin, Direct 0.4 0.1 - 0.5 mg/dL   Indirect Bilirubin 1.4 (H) 0.3 - 0.9 mg/dL  Glucose, capillary     Status: Abnormal   Collection Time: 01/19/15  6:05 AM  Result Value Ref Range   Glucose-Capillary 144 (H) 65 - 99 mg/dL   Comment 1 Notify RN    Comment 2 Document in Chart   Comprehensive metabolic panel     Status: Abnormal   Collection Time: 01/20/15  5:00 AM  Result Value Ref Range   Sodium 138 135 - 145 mmol/L   Potassium 3.6 3.5 - 5.1 mmol/L   Chloride 106 101 - 111 mmol/L   CO2 24 22 - 32 mmol/L   Glucose, Bld 124 (H) 65 - 99 mg/dL   BUN 26 (H) 6 - 20 mg/dL   Creatinine, Ser 1.77 (H) 0.61 - 1.24 mg/dL   Calcium 8.5 (L) 8.9 - 10.3 mg/dL   Total Protein 5.6 (L) 6.5 - 8.1 g/dL   Albumin 2.4 (L) 3.5 - 5.0 g/dL   AST 18 15 - 41 U/L   ALT 21 17 - 63 U/L   Alkaline Phosphatase 92 38 - 126 U/L   Total Bilirubin 1.2 0.3 - 1.2 mg/dL   GFR calc non Af Amer 36 (L) >60 mL/min   GFR calc Af Amer 42 (L) >60 mL/min    Comment: (NOTE) The eGFR has been calculated using the CKD EPI equation. This calculation has not been validated in all clinical situations. eGFR's persistently <60 mL/min signify possible Chronic Kidney Disease.    Anion gap 8 5 - 15  CBC     Status: Abnormal   Collection Time: 01/20/15  5:00 AM  Result Value Ref Range   WBC 15.1 (H) 4.0 - 10.5 K/uL   RBC 3.81 (L) 4.22 - 5.81 MIL/uL   Hemoglobin 10.8 (L) 13.0 - 17.0 g/dL   HCT 33.5 (L) 39.0 - 52.0 %   MCV 87.9 78.0 - 100.0 fL   MCH 28.3 26.0 -  34.0 pg   MCHC 32.2 30.0 - 36.0 g/dL   RDW 13.4 11.5 - 15.5 %   Platelets 112 (L) 150 - 400 K/uL    Comment: PLATELET COUNT CONFIRMED BY SMEAR SPECIMEN CHECKED FOR CLOTS REPEATED TO VERIFY   Glucose, capillary     Status: Abnormal   Collection Time: 01/20/15  6:01 AM  Result Value Ref Range   Glucose-Capillary 121 (H) 65 - 99 mg/dL    Imaging / Studies: Ir Perc  Cholecystostomy  01/18/2015   CLINICAL DATA:  74 year old with right upper quadrant pain and suspected acute cholecystitis. Request for percutaneous cholecystostomy tube because not a good surgical candidate at this time.  EXAM: ULTRASOUND AND FLUOROSCOPIC GUIDED CHOLECYSTOSTOMY TUBE PLACEMENT  Physician: Stephan Minister. Henn, MD  FLUOROSCOPY TIME:  30 seconds, 13.75 mGy  MEDICATIONS AND MEDICAL HISTORY: 1 mg versed, 50 mcg fentanyl. A radiology nurse monitored the patient for moderate sedation.  ANESTHESIA/SEDATION: Moderate sedation time: 8 minutes  CONTRAST:  None  PROCEDURE: The procedure was explained to the patient. The risks and benefits of the procedure were discussed and the patient's questions were addressed. Informed consent was obtained from the patient. Patient was placed supine on the interventional table. Ultrasound demonstrated a distended gallbladder. The right upper abdomen was prepped and draped in sterile fashion. Maximal barrier sterile technique was utilized including caps, mask, sterile gowns, sterile gloves, sterile drape, hand hygiene and skin antiseptic. Skin and soft tissues were anesthetized with 1% lidocaine. Using ultrasound guidance, a 21 gauge needle was directed into the gallbladder from a transhepatic approach. Needle position was confirmed within the gallbladder with ultrasound. Dark bilious fluid immediately started draining from the needle hub. A 0.018 wire was advanced into the gallbladder with ultrasound guidance. An Accustick dilator set was placed under fluoroscopy. The tract was dilated to accommodate a 10.2 [place] multipurpose drain. Greater than 100 mL of dark fluid was aspirated from the gallbladder. The gallbladder was decompressed at the end of the procedure. Catheter was sutured to the skin. Catheter was attached to a gravity bag. Sterile dressing was placed. Fluoroscopic and ultrasound images were taken and saved for documentation. Fluid sample was sent for culture.   FINDINGS: Distended gallbladder by ultrasound. Greater than 100 mL of dark bilious fluid was removed from the gallbladder. Tube position confirmed within the gallbladder with ultrasound.  Estimated blood loss: Minimal  COMPLICATIONS: None  IMPRESSION: Successful cholecystostomy tube placement with ultrasound and fluoroscopic guidance.   Electronically Signed   By: Markus Daft M.D.   On: 01/18/2015 16:00    Medications / Allergies:  Scheduled Meds: . amLODipine  10 mg Oral Daily  . aspirin EC  81 mg Oral Daily  . citalopram  20 mg Oral Daily  . cloNIDine  0.1 mg Oral BID  . enoxaparin (LOVENOX) injection  40 mg Subcutaneous Q24H  . isosorbide mononitrate  30 mg Oral Daily  . loratadine  10 mg Oral Daily  . pantoprazole (PROTONIX) IV  40 mg Intravenous Q24H  . piperacillin-tazobactam (ZOSYN)  IV  3.375 g Intravenous Q8H  . pramipexole  1 mg Oral TID  . sodium chloride  3 mL Intravenous Q12H  . zolpidem  5 mg Oral QHS   Continuous Infusions: . sodium chloride 125 mL/hr at 01/19/15 2158   PRN Meds:.acetaminophen **OR** acetaminophen, hydrALAZINE, morphine injection, nitroGLYCERIN, ondansetron (ZOFRAN) IV  Antibiotics: Anti-infectives    Start     Dose/Rate Route Frequency Ordered Stop   01/18/15 0800  piperacillin-tazobactam (ZOSYN) IVPB  3.375 g     3.375 g 12.5 mL/hr over 240 Minutes Intravenous Every 8 hours 01/18/15 0257     01/18/15 0300  piperacillin-tazobactam (ZOSYN) IVPB 3.375 g     3.375 g 100 mL/hr over 30 Minutes Intravenous  Once 01/18/15 0257 01/18/15 0524        Assessment/Plan Acute cholecystitis-s/p percutaneous cholecystostomy tube.  This will remain in place for 6-8 weeks.  It needs to be flushed daily and the patient will need home health initially.  Nursing to teach how to document output.  He needs a total of 7 days of antibiotics and may change to augmentin.  Adhere to a low fat diet.  We will have him follow up with Dr. Redmond Pulling in 3-4 weeks to discuss interval  cholecystectomy 6-8 weeks if deemed a candidate.  plavix may be resumed.   may discharge from surgical standpoint.  Further recommendations per primary team. Please call [facility] for further assistance.   Erby Pian, [facility] Pager [phone]) For consults and floor pages call [phone])  01/20/2015 8:29 AM

## 2015-01-20 NOTE — Progress Notes (Signed)
Physical Therapy Treatment Patient Details Name: Victor Davis MRN: [id number] DOB: Mar 30, 1941 Today's Date: 01/20/2015    History of Present Illness Pt is a 74 y.o. male with a PMH with HTN, hyperlipidemia, GERD, peptic ulcer, PVD, prostate CA 2008, ELS, CAD, emphysema. Pt presents with N/V and abdominal pain. He was found to have a distended gallbladder and was admitted for further medical management.     PT Comments    Pt progressing slowly towards physical therapy goals. Was talking unintelligibly for a fair portion of the session today. Pt required increased assistance to maintain safety during gait training. Discussed concerns for return home with RN and MD. Recommending continued rehab at SNF, as pt is not at a level where he can safely perform functional mobility at home at this time. Pt appeared agreeable, however also did not appear to understand at times during discussion.   Follow Up Recommendations  Supervision/Assistance - 24 hour;SNF     Equipment Recommendations       Recommendations for Other Services       Precautions / Restrictions Precautions Precautions: Fall Restrictions Weight Bearing Restrictions: No    Mobility  Bed Mobility Overal bed mobility: Needs Assistance Bed Mobility: Supine to Sit     Supine to sit: Supervision     General bed mobility comments: VC's for positioning in bed. Pt became frustrated, stating "you're never happy are you?"  Transfers Overall transfer level: Needs assistance Equipment used: None Transfers: Sit to/from Stand Sit to Stand: Min guard         General transfer comment: Steadying assist as pt powers up to full standing position.  Ambulation/Gait Ambulation/Gait assistance: Min assist Ambulation Distance (Feet): 150 Feet Assistive device: None Gait Pattern/deviations: Step-through pattern;Decreased stride length;Staggering left;Staggering right;Trunk flexed Gait velocity: Decreased Gait velocity  interpretation: Below normal speed for age/gender General Gait Details: Pt required occasional steadying assist. Used SPC throughout gait training and continually tried to lengthen the cane. Pt appears more unsteady this session than last.    Stairs            Wheelchair Mobility    Modified Rankin (Stroke Patients Only)       Balance Overall balance assessment: Needs assistance Sitting-balance support: Feet supported;No upper extremity supported Sitting balance-Leahy Scale: Good Sitting balance - Comments: Not able to put on sock while sitting EOB   Standing balance support: No upper extremity supported Standing balance-Leahy Scale: Poor Standing balance comment: Requires assist                    Cognition Arousal/Alertness: Lethargic Behavior During Therapy: Impulsive;Restless Overall Cognitive Status: No family/caregiver present to determine baseline cognitive functioning                      Exercises      General Comments        Pertinent Vitals/Pain Pain Assessment: Faces Pain Score: 7  Faces Pain Scale: Hurts a little bit Pain Location: drain site Pain Descriptors / Indicators: Nagging Pain Intervention(s): Limited activity within patient's tolerance;Monitored during session;Repositioned    Home Living                      Prior Function            PT Goals (current goals can now be found in the care plan section) Acute Rehab PT Goals Patient Stated Goal: not stated PT Goal Formulation: With patient Time For Goal Achievement:  01/25/15 Potential to Achieve Goals: Good Progress towards PT goals: Progressing toward goals    Frequency  Min 3X/week    PT Plan Discharge plan needs to be updated    Co-evaluation             End of Session Equipment Utilized During Treatment: Gait belt Activity Tolerance: Patient tolerated treatment well Patient left: in chair;with call bell/phone within reach;with chair alarm  set     Time: 1310-1336 PT Time Calculation (min) (ACUTE ONLY): 26 min  Charges:  $Gait Training: 8-22 mins $Therapeutic Activity: 8-22 mins                    G Codes:      Rolinda Roan 2015/02/17, 3:27 PM  Rolinda Roan, PT, DPT Acute Rehabilitation Services Pager: [phone]

## 2015-01-21 DIAGNOSIS — E785 Hyperlipidemia, unspecified: Secondary | ICD-10-CM | POA: Diagnosis not present

## 2015-01-21 DIAGNOSIS — M6281 Muscle weakness (generalized): Secondary | ICD-10-CM | POA: Diagnosis not present

## 2015-01-21 DIAGNOSIS — I739 Peripheral vascular disease, unspecified: Secondary | ICD-10-CM | POA: Diagnosis not present

## 2015-01-21 DIAGNOSIS — R1031 Right lower quadrant pain: Secondary | ICD-10-CM | POA: Diagnosis not present

## 2015-01-21 DIAGNOSIS — I10 Essential (primary) hypertension: Secondary | ICD-10-CM | POA: Diagnosis not present

## 2015-01-21 DIAGNOSIS — G2581 Restless legs syndrome: Secondary | ICD-10-CM | POA: Diagnosis not present

## 2015-01-21 DIAGNOSIS — Z434 Encounter for attention to other artificial openings of digestive tract: Secondary | ICD-10-CM | POA: Diagnosis not present

## 2015-01-21 DIAGNOSIS — J439 Emphysema, unspecified: Secondary | ICD-10-CM | POA: Diagnosis not present

## 2015-01-21 DIAGNOSIS — Z7901 Long term (current) use of anticoagulants: Secondary | ICD-10-CM | POA: Diagnosis not present

## 2015-01-21 DIAGNOSIS — K810 Acute cholecystitis: Secondary | ICD-10-CM | POA: Diagnosis not present

## 2015-01-21 DIAGNOSIS — I2510 Atherosclerotic heart disease of native coronary artery without angina pectoris: Secondary | ICD-10-CM | POA: Diagnosis not present

## 2015-01-21 DIAGNOSIS — I25119 Atherosclerotic heart disease of native coronary artery with unspecified angina pectoris: Secondary | ICD-10-CM | POA: Diagnosis not present

## 2015-01-21 DIAGNOSIS — T8579XD Infection and inflammatory reaction due to other internal prosthetic devices, implants and grafts, subsequent encounter: Secondary | ICD-10-CM | POA: Diagnosis not present

## 2015-01-21 DIAGNOSIS — K219 Gastro-esophageal reflux disease without esophagitis: Secondary | ICD-10-CM | POA: Diagnosis not present

## 2015-01-21 DIAGNOSIS — M1990 Unspecified osteoarthritis, unspecified site: Secondary | ICD-10-CM | POA: Diagnosis not present

## 2015-01-21 DIAGNOSIS — R911 Solitary pulmonary nodule: Secondary | ICD-10-CM | POA: Diagnosis not present

## 2015-01-21 LAB — GLUCOSE, CAPILLARY: Glucose-Capillary: 130 mg/dL — ABNORMAL HIGH (ref 65–99)

## 2015-01-21 MED ORDER — ACETAMINOPHEN 325 MG PO TABS: 650.0000 mg | ORAL_TABLET | Freq: Four times a day (QID) | ORAL | Status: DC | PRN

## 2015-01-21 MED ORDER — PANTOPRAZOLE SODIUM 40 MG PO TBEC
40.0000 mg | DELAYED_RELEASE_TABLET | Freq: Every day | ORAL | Status: DC
  Filled 2015-01-21: qty 1

## 2015-01-21 MED ORDER — AMOXICILLIN-POT CLAVULANATE ER 1000-62.5 MG PO TB12: 2.0000 | ORAL_TABLET | Freq: Two times a day (BID) | ORAL | Status: DC

## 2015-01-21 NOTE — Discharge Summary (Signed)
Physician Discharge Summary  Victor Davis:[id number] DOB: [birth year] DOA: 01/17/2015  PCP: Victor Cower, MD  Admit date: 01/17/2015 Discharge date: 01/21/2015  Time spent: 35 minutes  Recommendations for Outpatient Follow-up:  1. Please administer Augmentin 875 1 tab PO BID for 6 days then stop 2. Cholecystostomy tube to remain in place for 6-8 weeeks 3. Flush cholecystostomy tube daily 4. Follow up with Victor Davis of surgery  Discharge Diagnoses:  Principal Problem:   Abdominal pain Active Problems:   HLD (hyperlipidemia)   RESTLESS LEG SYNDROME   Peripheral vascular disease   Osteoarthritis   PROSTATE CANCER, HX OF   CAD (coronary artery disease)   Hypertension   Pulmonary emphysema   Cholecystitis   Acute on chronic kidney failure   Acute cholecystitis   Discharge Condition: Stable  Diet recommendation: Heart Healthy  Filed Weights   01/18/15 0230  Weight: 69.3 kg (152 lb 12.5 oz)    History of present illness:  Victor Davis is a 74 y.o. male with PMH of hypertension, hyperlipidemia, GERD, peptic ulcer, peripheral vascular disease, prostate cancer 2008 (s/p of surgery), ELS, CAD, pulmonary emphysema, sleep disorder, who presents with nausea, vomiting and abdominal pain.  Patient reports that he has been having abdominal pain in the past 5 days. The abdominal pain is located in the right side of abdomen, worse on the right upper quadrant. He describes the pain is sharp and moderate to severe. It is aggravated by eating food. It is associated with nausea and vomiting. He vomited more than 5 times yesterday. No diarrhea. He reports that he had one episode of dark stool.  In ED, patient was found to have WBC 12.8, temperature normal, no tachycardia, mild AKI, negative FOBT. CA-abd/pelvis showed distended gallbladder with a dilated bile duct and inflammatory changes. An abdominal ultrasound confirms a distended gallbladder with a thickened wall and sludge. Liver function  tests are normal.   Hospital Course:  Patient is a 74 year old woman with a past medical history of hypertension, coronary artery disease, chronic obstructive pulmonary disease was admitted to the medicine service on 01/18/2015 presenting with complaints of abdominal pain. He was worked up with abdominal ultrasound that revealed distended gallbladder with sludge displaying increased wall thickness. Also noted was dilated common bile duct. Findings consistent with an acute acalculous cholecystitis. General surgery was consulted who recommended patient undergo a percutaneous cholecystostomy tube placement by interventional radiology. This procedure was done by interventional radiology on 01/18/2015 without immediate complications. Body fluid cultures revealed abundant gram-negative rods. He was treated with IV Zosyn and transitioned to oral Augmentin on 01/20/2015. He was evaluated by physical therapy during this hospitalization who recommended rehabilitation at skilled nursing facility. Patient was discharged to SNF on 01/21/2015.  Procedures:  Percutaneous cholecystostomy tube placement performed by interventional radiology on 01/18/2015  Consultations:  Interventional radiology  General surgery  Discharge Exam: Filed Vitals:   01/21/15 0420  BP: 152/99  Pulse: 92  Temp: 98 F (36.7 C)  Resp: 18     General: NAD, resting in bed, drowsy  HEENT: no scleral icterus noted  Cardiovascular: RRR without MRG, 2+ peripheral pulses, no edema  Respiratory: CTA biL, good air movement, no wheezing, no crackles, no rales  Abdomen: soft, tender around PTC site, PTC bag with dark yellow fluid  MSK/Extremities: no clubbing/cyanosis, no joint swelling  Discharge Instructions   Discharge Instructions    Call MD for:  difficulty breathing, headache or visual disturbances    Complete by:  As  directed      Call MD for:  extreme fatigue    Complete by:  As directed      Call MD for:  hives     Complete by:  As directed      Call MD for:  persistant dizziness or light-headedness    Complete by:  As directed      Call MD for:  persistant nausea and vomiting    Complete by:  As directed      Call MD for:  redness, tenderness, or signs of infection (pain, swelling, redness, odor or green/yellow discharge around incision site)    Complete by:  As directed      Call MD for:  severe uncontrolled pain    Complete by:  As directed      Call MD for:  temperature >100.4    Complete by:  As directed      Call MD for:    Complete by:  As directed      Diet - low sodium heart healthy    Complete by:  As directed      Increase activity slowly    Complete by:  As directed           Current Discharge Medication List    START taking these medications   Details  acetaminophen (TYLENOL) 325 MG tablet Take 2 tablets (650 mg total) by mouth every 6 (six) hours as needed for mild pain (or Fever >/= 101). Qty: 30 tablet, Refills: 1    amoxicillin-clavulanate (AUGMENTIN XR) 1000-62.5 MG per tablet Take 2 tablets by mouth every 12 (twelve) hours. Qty: 12 tablet, Refills: 0      CONTINUE these medications which have NOT CHANGED   Details  alum & mag hydroxide-simeth (MAALOX/MYLANTA) 200-200-20 MG/5ML suspension Take 30 mLs by mouth every 6 (six) hours as needed for indigestion or heartburn.    amLODipine (NORVASC) 10 MG tablet Take 1 tablet (10 mg total) by mouth daily. Qty: 90 tablet, Refills: 3    aspirin 81 MG EC tablet Take 81 mg by mouth daily.      citalopram (CELEXA) 40 MG tablet Take 1 tablet (40 mg total) by mouth daily. Qty: 90 tablet, Refills: 3    cloNIDine (CATAPRES) 0.1 MG tablet Take 1 tablet by mouth every AM and 2 tablets by mouth every PM Qty: 270 tablet, Refills: 3    clopidogrel (PLAVIX) 75 MG tablet Take 1 tablet (75 mg total) by mouth daily. Qty: 90 tablet, Refills: 1    isosorbide mononitrate (IMDUR) 30 MG 24 hr tablet TAKE 1 TABLET (30 MG TOTAL) BY MOUTH  DAILY. Qty: 30 tablet, Refills: 5    loratadine (CLARITIN) 10 MG tablet Take 10 mg by mouth daily.    nitroGLYCERIN (NITROSTAT) 0.4 MG SL tablet Place 1 tablet (0.4 mg total) under the tongue every 5 (five) minutes as needed for chest pain. Qty: 25 tablet, Refills: 6   Associated Diagnoses: Atherosclerosis of native coronary artery of native heart without angina pectoris    pantoprazole (PROTONIX) 40 MG tablet Take 1 tablet (40 mg total) by mouth daily. Qty: 30 tablet, Refills: 11   Associated Diagnoses: Gastroesophageal reflux disease, esophagitis presence not specified    pramipexole (MIRAPEX) 1 MG tablet TAKE 1 TABLET (1 MG TOTAL) BY MOUTH 3 (THREE) TIMES DAILY. Qty: 90 tablet, Refills: 5      STOP taking these medications     ibuprofen (ADVIL,MOTRIN) 200 MG tablet  zolpidem (AMBIEN) 5 MG tablet      lovastatin (MEVACOR) 40 MG tablet        Allergies  Allergen Reactions  . Amitriptyline Hcl Other (See Comments)    REACTION: confusion  . Diphenhydramine Hcl Other (See Comments)    Keeps him awake  . Simvastatin Other (See Comments)    REACTION: leg pain   Follow-up Information    Follow up with Gayland Curry, MD In 3 weeks.   Specialty:  General Surgery   Contact information:   [address] [place] [address] [phone]       Follow up with [facility].   Why:  cane   Contact information:   [address] [place] [address] [phone]       Follow up with [facility].   Why:  Registered Nurse, Physical therapist, occupational therapist    Contact information:   [address] [place] [address] [phone]       Follow up with Victor Cower, MD In 2 weeks.   Specialties:  Internal Medicine, Radiology   Contact information:   [place] [address] [phone]        The results of significant diagnostics from this hospitalization (including imaging,  microbiology, ancillary and laboratory) are listed below for reference.    Significant Diagnostic Studies: US Abdomen Complete  01/18/2015   CLINICAL DATA:  Patient with right-sided abdominal pain for 5 days.  EXAM: ULTRASOUND ABDOMEN COMPLETE  COMPARISON:  CT scan earlier today.  FINDINGS: Gallbladder: Distended with sludge. Wall thickness slightly greater than 4 mm. Negative sonographic Murphy's sign but the patient is reportedly heavily sedated.  Common bile duct: Diameter: 12 mm maximum, may taper distally to 7-9 mm.  Liver: No focal lesion identified. Within normal limits in parenchymal echogenicity.  IVC: No abnormality visualized.  Pancreas: Visualized portion unremarkable.  Spleen: Size and appearance within normal limits.  Right Kidney: Length: 10 cm. Hypoechoic lower pole cyst 2.3 x 1.5 x 1.9 cm  Left Kidney: Length: 10.3 cm. Hypoechoic lower pole cyst measuring 3.4 x 2.4 x 3.2 cm  Abdominal aorta: No aneurysm visualized.  Other findings: None.  IMPRESSION: Gallbladder is distended with sludge displaying increasing wall thickness. Dilated common bile duct. Findings consistent with acute acalculous cholecystitis.  No definite common bile duct stones, although the increased diameter of the common bile duct suggests that there may be an ampullary obstruction.   Electronically Signed   By: Rolla Flatten M.D.   On: 01/18/2015 02:00   Ct Abdomen Pelvis W Contrast  01/17/2015   CLINICAL DATA:  74 year old male with right-sided abdominal pain for 4-5 days. Emesis.  EXAM: CT ABDOMEN AND PELVIS WITH CONTRAST  TECHNIQUE: Multidetector CT imaging of the abdomen and pelvis was performed using the standard protocol following bolus administration of intravenous contrast.  CONTRAST:  160m OMNIPAQUE IOHEXOL 300 MG/ML  SOLN  COMPARISON:  CT 04/08/2013  FINDINGS: Mild right greater than left basilar atelectasis. Dense coronary artery calcifications versus coronary stent. No pleural effusion.  The gallbladder is  distended, there is question of mild pericholecystic soft tissue stranding. No calcified gallstones. There is prominence of the common bile duct measuring 10 mm at the porta hepatis. No focal hepatic lesion. The spleen, adrenal glands, and pancreas are unremarkable. No pancreatic ductal dilatation.  Complex cystic lesion in the lower pole the left kidney appears slightly decreased in size from  prior exam, currently 3.4 x 3.4 cm, previously 4.3 x 4.8 cm. Thin peripheral enhancement again suspected medially. Additional scattered simple cysts throughout both kidneys are unchanged. There is no hydronephrosis. There is symmetric enhancement and excretion on delayed phase imaging.  Moderate hiatal hernia. Stomach is physiologically distended. There are no dilated or thickened bowel loops. The appendix is normal. There is moderate stool throughout the colon with multifocal colonic diverticula, most significant in the distal colon. No acute diverticulitis. No colonic wall thickening. No free air, free fluid, or intra-abdominal fluid collection.  No retroperitoneal adenopathy. Abdominal aorta is normal in caliber. Moderate to dense atherosclerosis of the abdominal aorta without aneurysm.  Within the pelvis the urinary bladder is physiologically distended. Cheek artifact from bilateral hip prostheses limits pelvic evaluation. Prostate gland is not evaluated, patient is post prostatectomy by report. Surgical clips in the bilateral external iliac stations.  Postsurgical change of the anterior abdominal wall with multiple tacks.  There are no acute or suspicious osseous abnormalities. There is multilevel degenerative change throughout the lumbar spine with both degenerative disc disease and facet arthropathy.  IMPRESSION: 1. Distended gallbladder with question of adjacent inflammatory change. Prominence of the common bile duct measuring 10 mm. Findings are concerning for acute cholecystitis and possible choledocholithiasis.  Right upper quadrant ultrasound could be considered for further evaluation. 2. Extensive diverticulosis without acute diverticulitis. 3. Decreased size of complex left renal cyst from prior exam. Decreasing size suggests benign etiology.   Electronically Signed   By: Jeb Levering M.D.   On: 01/17/2015 23:38   Ir Perc Cholecystostomy  01/18/2015   CLINICAL DATA:  74 year old with right upper quadrant pain and suspected acute cholecystitis. Request for percutaneous cholecystostomy tube because not a good surgical candidate at this time.  EXAM: ULTRASOUND AND FLUOROSCOPIC GUIDED CHOLECYSTOSTOMY TUBE PLACEMENT  Physician: Stephan Minister. Henn, MD  FLUOROSCOPY TIME:  30 seconds, 13.75 mGy  MEDICATIONS AND MEDICAL HISTORY: 1 mg versed, 50 mcg fentanyl. A radiology nurse monitored the patient for moderate sedation.  ANESTHESIA/SEDATION: Moderate sedation time: 8 minutes  CONTRAST:  None  PROCEDURE: The procedure was explained to the patient. The risks and benefits of the procedure were discussed and the patient's questions were addressed. Informed consent was obtained from the patient. Patient was placed supine on the interventional table. Ultrasound demonstrated a distended gallbladder. The right upper abdomen was prepped and draped in sterile fashion. Maximal barrier sterile technique was utilized including caps, mask, sterile gowns, sterile gloves, sterile drape, hand hygiene and skin antiseptic. Skin and soft tissues were anesthetized with 1% lidocaine. Using ultrasound guidance, a 21 gauge needle was directed into the gallbladder from a transhepatic approach. Needle position was confirmed within the gallbladder with ultrasound. Dark bilious fluid immediately started draining from the needle hub. A 0.018 wire was advanced into the gallbladder with ultrasound guidance. An Accustick dilator set was placed under fluoroscopy. The tract was dilated to accommodate a 10.2 [place] multipurpose drain. Greater than 100 mL of dark  fluid was aspirated from the gallbladder. The gallbladder was decompressed at the end of the procedure. Catheter was sutured to the skin. Catheter was attached to a gravity bag. Sterile dressing was placed. Fluoroscopic and ultrasound images were taken and saved for documentation. Fluid sample was sent for culture.  FINDINGS: Distended gallbladder by ultrasound. Greater than 100 mL of dark bilious fluid was removed from the gallbladder. Tube position confirmed within the gallbladder with ultrasound.  Estimated blood loss: Minimal  COMPLICATIONS: None  IMPRESSION: Successful  cholecystostomy tube placement with ultrasound and fluoroscopic guidance.   Electronically Signed   By: Markus Daft M.D.   On: 01/18/2015 16:00    Microbiology: Recent Results (from the past 240 hour(s))  Culture, blood (x 2)     Status: None (Preliminary result)   Collection Time: 01/18/15  3:15 AM  Result Value Ref Range Status   Specimen Description BLOOD LEFT ANTECUBITAL  Final   Special Requests BOTTLES DRAWN AEROBIC AND ANAEROBIC 5CC  Final   Culture   Final           BLOOD CULTURE RECEIVED NO GROWTH TO DATE CULTURE WILL BE HELD FOR 5 DAYS BEFORE ISSUING A FINAL NEGATIVE REPORT Performed at [facility]    Report Status PENDING  Incomplete  Culture, blood (x 2)     Status: None (Preliminary result)   Collection Time: 01/18/15  3:30 AM  Result Value Ref Range Status   Specimen Description BLOOD RIGHT ANTECUBITAL  Final   Special Requests BOTTLES DRAWN AEROBIC AND ANAEROBIC 5CC  Final   Culture   Final           BLOOD CULTURE RECEIVED NO GROWTH TO DATE CULTURE WILL BE HELD FOR 5 DAYS BEFORE ISSUING A FINAL NEGATIVE REPORT Performed at [facility]    Report Status PENDING  Incomplete  BODY FLUID CULTURE     Status: None (Preliminary result)   Collection Time: 01/18/15  3:48 PM  Result Value Ref Range Status   Specimen Description FLUID BILE  Final   Special Requests NONE  Final   Gram Stain   Final     NO WBC SEEN RARE GRAM NEGATIVE RODS Performed at [facility]    Culture   Final    ABUNDANT Amador Performed at [facility]    Report Status PENDING  Incomplete     Labs: Basic Metabolic Panel:  Recent Labs Lab 01/17/15 1852 01/18/15 0315 01/19/15 0458 01/20/15 0500  NA 141 141 141 138  K 3.9 3.9 3.9 3.6  CL 104 105 107 106  CO2 _0 GLUCOSE 147* 174* 145* 124*  BUN 24* 20 23* 26*  CREATININE 1.93* 1.53* 1.71* 1.77*  CALCIUM 9.4 8.8* 8.7* 8.5*   Liver Function Tests:  Recent Labs Lab 01/17/15 1852 01/18/15 0315 01/19/15 0458 01/20/15 0500  AST _1 ALT _2 ALKPHOS 87 78 90 92  BILITOT 0.6 1.1 1.8* 1.2  PROT 7.1 6.5 5.5* 5.6*  ALBUMIN 3.9 3.7 2.7* 2.4*    Recent Labs Lab 01/17/15 1852  LIPASE 16*   No results for input(s): AMMONIA in the last 168 hours. CBC:  Recent Labs Lab 01/17/15 1852 01/18/15 0315 01/19/15 0458 01/20/15 0500  WBC 12.8* 12.3* 15.0* 15.1*  NEUTROABS 9.7*  --   --   --   HGB 13.1 12.3* 12.1* 10.8*  HCT 39.7 37.7* 37.2* 33.5*  MCV 87.4 87.1 87.9 87.9  PLT 162 134* 125* 112*   Cardiac Enzymes: No results for input(s): CKTOTAL, CKMB, CKMBINDEX, TROPONINI in the last 168 hours. BNP: BNP (last 3 results) No results for input(s): BNP in the last 8760 hours.  ProBNP (last 3 results) No results for input(s): PROBNP in the last 8760 hours.  CBG:  Recent Labs Lab 01/18/15 0626 01/19/15 0605 01/20/15 0601 01/21/15 0620  GLUCAP 157* 144* 121* 130*       Signed:  Kelvin Cellar  [facility] 01/21/2015,  10:14 AM

## 2015-01-21 NOTE — Progress Notes (Signed)
Patient will discharge to [facility] Anticipated discharge date:01/21/15 Family notified:pt wife Transportation by family  CSW signing off.  Domenica Reamer, [place] Social Worker [phone]

## 2015-01-21 NOTE — Progress Notes (Signed)
Utilization review completed.  

## 2015-01-21 NOTE — Clinical Social Work Note (Signed)
Clinical Social Work Assessment  Patient Details  Name: Victor Davis MRN: [id number] Date of Birth: 1941-01-30  Date of referral:  01/21/15               Reason for consult:  Facility Placement                Permission sought to share information with:  Family Supports, Customer service manager Permission granted to share information::  Yes, Verbal Permission Granted  Name::     Victor Davis  Agency::  [facility] [facility]  Relationship::  wife  Contact Information:     Housing/Transportation Living arrangements for the past 2 months:  Single Family Home Source of Information:  Spouse Patient Interpreter Needed:  None Criminal Activity/Legal Involvement Pertinent to Current Situation/Hospitalization:  No - Comment as needed Significant Relationships:  Spouse Lives with:  Spouse Do you feel safe going back to the place where you live?  Yes Need for family participation in patient care:  Yes (Comment)  Care giving concerns:  Plan was for patient to return home with home health but pt wife recently admitted to [facility] and was going to be the primary caregiver at home   Social Worker assessment / plan:  Pt wife informed of PT recommendation for SNF/ wife had requested pt go to SNF after her recent admission and his progressive deconditioning during his inpatient stay   Employment status:  Retired Nurse, adult PT Recommendations:  [place], [place] / Referral to community resources:  [place]  Patient/Family's Response to care:  Patients family are on board for SNF and prefer [facility] [place]  Patient/Family's Understanding of and Emotional Response to Diagnosis, Current Treatment, and Prognosis: unclear  Emotional Assessment Appearance:    Attitude/Demeanor/Rapport:  Unable to Assess Affect (typically observed):  Unable to Assess Orientation:  Oriented to Self, Oriented  to Place Alcohol / Substance use:  Not Applicable Psych involvement (Current and /or in the community):  No (Comment)  Discharge Needs  Concerns to be addressed:  Discharge Planning Concerns Readmission within the last 30 days:  No Current discharge risk:  Physical Impairment Barriers to Discharge:  No Barriers Identified   Cranford Mon, LCSW 01/21/2015, 10:16 AM

## 2015-01-21 NOTE — Care Management Note (Signed)
Case Management Note  Patient Details  Name: EULAN HEYWARD MRN: [id number] Date of Birth: 08-29-1940  Subjective/Objective:        Pt admitted with 5 day c/o right upper quadrant abdominal pain            Action/Plan:  Pt will have drain placed by IR left in place for approximately 6 weeks, then he will have an interval choecystectomy.  CM will continue to monitor for disposition needs.   Expected Discharge Date:                  Expected Discharge Plan:  [place]  In-House Referral:     Discharge planning Services  CM Consult  Post Acute Care Choice:    Choice offered to:     DME Arranged:   DME Agency:    HH Arranged:   [place] Agency:    Status of Service: Complete, will sign off  Medicare Important Message Given:  Yes Date Medicare IM Given:  01/18/15 Medicare IM give by:  Elenor Quinones Date Additional Medicare IM Given:  01/20/15 Additional Medicare Important Message give by:  Elenor Quinones  If discussed at Long Length of Stay Meetings, dates discussed:    Disposition Plan:  01/21/15 SNF  Additional Comments: 01/21/15 Elenor Quinones, RN, BSN [phone] Disposition plan has changed,  Pt will discharge to SNF per SW consult.  [facility] agencies contacted and informed that referral has been cancelled.    01/19/15 Elenor Quinones, RN, BSN [phone] Therapy reassessed pt this am and recommendations remain the same. MD agreed with PT/OT recommendations with the addition of RN to manage drain care, orders will be written. CM was contacted by [place] and informed that [facility] would not be able to start before a week post discharge.  CM contacted wife and offered choice, pt chose [facility].  Pt's wife was admitted to [facility] 6/6 evening and is unable to provide supervision for pt.  Wife has made arrangements with her sister Adolphus Birchwood for pt to stay post discharge.  CM communicated 24 hour recommendation; Ms Verl Blalock works during the day  however grand daughter and neighbor  will provide supervision for pt during day hours.   CM contacted Adolphus Birchwood, Ms Verl Blalock verified that pt would reside with her initially post discharge and agreed to [facility] services coming into her home.  Adolphus Birchwood ( mobile [phone], home [phone])  Address: [address] [place] [address].  [facility] contacted, referral accepted, address and phone numbers of Adolphus Birchwood provided to agency.  Adolphus Birchwood will pick pt up from hospital and requested to be contacted shortly prior to discharge.  CM will communicate request to bedside nurse and place sticky note on summary tab.  No additional CM needs    01/18/15 Elenor Quinones, RN, BSN [phone] CM spoke with pt regarding [place] services, pt instructed nurse to ask wife.  Wife contacted and stated she would prefer [place] as she is already receiving [place] with agency.  CM contacted [place].    Maryclare Labrador, RN 01/21/2015, 11:18 AM

## 2015-01-22 LAB — BODY FLUID CULTURE: Gram Stain: NONE SEEN

## 2015-01-24 LAB — CULTURE, BLOOD (ROUTINE X 2)
CULTURE: NO GROWTH
CULTURE: NO GROWTH

## 2015-01-25 ENCOUNTER — Encounter: Payer: Self-pay | Admitting: Internal Medicine

## 2015-01-25 ENCOUNTER — Non-Acute Institutional Stay (SKILLED_NURSING_FACILITY): Payer: Medicare Other | Admitting: Internal Medicine

## 2015-01-25 DIAGNOSIS — N179 Acute kidney failure, unspecified: Secondary | ICD-10-CM

## 2015-01-25 DIAGNOSIS — T8579XD Infection and inflammatory reaction due to other internal prosthetic devices, implants and grafts, subsequent encounter: Secondary | ICD-10-CM

## 2015-01-25 DIAGNOSIS — J439 Emphysema, unspecified: Secondary | ICD-10-CM

## 2015-01-25 DIAGNOSIS — E0590 Thyrotoxicosis, unspecified without thyrotoxic crisis or storm: Secondary | ICD-10-CM

## 2015-01-25 DIAGNOSIS — I10 Essential (primary) hypertension: Secondary | ICD-10-CM

## 2015-01-25 DIAGNOSIS — N189 Chronic kidney disease, unspecified: Secondary | ICD-10-CM | POA: Diagnosis not present

## 2015-01-25 DIAGNOSIS — T8579XA Infection and inflammatory reaction due to other internal prosthetic devices, implants and grafts, initial encounter: Secondary | ICD-10-CM | POA: Insufficient documentation

## 2015-01-25 DIAGNOSIS — K810 Acute cholecystitis: Secondary | ICD-10-CM | POA: Diagnosis not present

## 2015-01-25 DIAGNOSIS — I25119 Atherosclerotic heart disease of native coronary artery with unspecified angina pectoris: Secondary | ICD-10-CM

## 2015-01-25 NOTE — Assessment & Plan Note (Signed)
Continue imdur, ASA 81 mg and prn SLG nitro

## 2015-01-25 NOTE — Assessment & Plan Note (Signed)
On no MDI's, only claritin

## 2015-01-25 NOTE — Assessment & Plan Note (Signed)
Continue norvasc, clonidine, imdur

## 2015-01-25 NOTE — Assessment & Plan Note (Signed)
2/2 infection; resooved with IVF

## 2015-01-25 NOTE — Assessment & Plan Note (Signed)
TSH 3.79-normal

## 2015-01-25 NOTE — Assessment & Plan Note (Signed)
Pt presenting complaints of abdominal pain. He was worked up with abdominal ultrasound that revealed distended gallbladder with sludge displaying increased wall thickness. Also noted was dilated common bile duct. Findings consistent with an acute acalculous cholecystitis. General surgery was consulted who recommended patient undergo a percutaneous cholecystostomy tube placement by interventional radiology. This procedure was done by interventional radiology on 01/18/2015 without immediate complications. Body fluid cultures revealed abundant gram-negative rods. He was treated with IV Zosyn and transitioned to oral Augmentin on 01/20/2015.resenting c/o

## 2015-01-25 NOTE — Progress Notes (Signed)
MRN: [id number] Name: Victor Davis  Sex: male Age: 74 y.o. DOB: Jan 11, 1941  [place] [facility] Facility/Room:120[facility] Level Of Care: SNF Provider: Inocencio Homes D Emergency Contacts: Extended Emergency Contact Information Primary Emergency Contact: Pineo,Brenda Address: [address]          [place], [place] [address] [place] Phone: [phone] Relation: Spouse Secondary Emergency Contact: Evette Georges Address: [address]          [place], [place] [address] [place] Phone: [phone] Relation: Son  Code Status: FULL  Allergies: Amitriptyline hcl; Diphenhydramine hcl; and Simvastatin  Chief Complaint  Patient presents with  . New Admit To SNF    HPI: Patient is 74 y.o. male who is admitted to SNF for generalized weakness s/p biliary drain placement for acute cholecystitis.  Past Medical History  Diagnosis Date  . BRADYCARDIA 10/24/2010  . CALF PAIN, RIGHT 10/22/2009  . 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 Dysuria 10/10/2010  . ERECTILE DYSFUNCTION 04/05/2007  . FATIGUE 10/02/2007  . GLUCOSE INTOLERANCE 07/15/2008  . HYPERLIPIDEMIA 07/30/2008  . HYPERSOMNIA 10/02/2007  . INSOMNIA-SLEEP DISORDER-UNSPEC 10/22/2009  . LOW BACK PAIN 04/05/2007  . OSTEOARTHRITIS 04/05/2007  . PEPTIC ULCER DISEASE 04/05/2007  . PERIPHERAL EDEMA 10/22/2009  . PERIPHERAL VASCULAR DISEASE 07/30/2008  . PROSTATE CANCER, HX OF 04/05/2007  . RESTLESS LEG SYNDROME 05/15/2007  . RHINITIS, ALLERGIC NOS 05/15/2007  . SHOULDER PAIN, LEFT 01/22/2008  . SINUSITIS- ACUTE-NOS 05/15/2007  . Unspecified essential hypertension 04/05/2007  . Unspecified visual loss 07/15/2008  . UTI 10/24/2010  . Impaired glucose tolerance 02/09/2011  . Coronary artery disease      95% followed by 80% mid LAD stenosis, circumflex 80% stenosis. There was a moderate size branching obtuse marginal with 90-95% stenosis in the inferior branch. The right coronary artery had proximal 30% stenosis.  Posterior lateral was moderate-sized with 40% stenosis. The EF was 65-70%. He had a DES and  x 2 to the LAD and DES x 2 to the circumflex 03/08/12.    [name] GERD (gastroesophageal reflux disease)   . Emphysema 04/01/2012    By CT chest, august 2013  . CKD (chronic kidney disease), stage II     Past Surgical History  Procedure Laterality Date  . Inguinal herniorrhapy left  2002    x 2  . Rotator cuff repair      left  . Knee surgery      left  . S/p ventral surgery  2009  . Prostatectomy  2002    radical  . Right hip replacement  09/22/09  . Left hip replacement    . Eye surgery      right  . Percutaneous coronary stent intervention (pci-s) N/A 03/08/2012    Procedure: PERCUTANEOUS CORONARY STENT INTERVENTION (PCI-S);  Surgeon: Burnell Blanks, MD;  Location[facility] CATH LAB;  Service: Cardiovascular;  Laterality: N/A;      Medication List       This list is accurate as of: 01/25/15 11:59 PM.  Always use your most recent med list.               acetaminophen 325 MG tablet  Commonly known as:  TYLENOL  Take 2 tablets (650 mg total) by mouth every 6 (six) hours as needed for mild pain (or Fever >/= 101).     alum & mag hydroxide-simeth 200-200-20 MG/5ML suspension  Commonly known as:  MAALOX/MYLANTA  Take 30 mLs by mouth every 6 (six) hours as needed for  indigestion or heartburn.     amLODipine 10 MG tablet  Commonly known as:  NORVASC  Take 1 tablet (10 mg total) by mouth daily.     amoxicillin-clavulanate 1000-62.5 MG per tablet  Commonly known as:  AUGMENTIN XR  Take 2 tablets by mouth every 12 (twelve) hours.     aspirin 81 MG EC tablet  Take 81 mg by mouth daily.     citalopram 40 MG tablet  Commonly known as:  CELEXA  Take 1 tablet (40 mg total) by mouth daily.     cloNIDine 0.1 MG tablet  Commonly known as:  CATAPRES  Take 1 tablet by mouth every AM and 2 tablets by mouth every PM     clopidogrel 75 MG tablet  Commonly known as:  PLAVIX  Take 1 tablet (75  mg total) by mouth daily.     isosorbide mononitrate 30 MG 24 hr tablet  Commonly known as:  IMDUR  TAKE 1 TABLET (30 MG TOTAL) BY MOUTH DAILY.     loratadine 10 MG tablet  Commonly known as:  CLARITIN  Take 10 mg by mouth daily.     nitroGLYCERIN 0.4 MG SL tablet  Commonly known as:  NITROSTAT  Place 1 tablet (0.4 mg total) under the tongue every 5 (five) minutes as needed for chest pain.     pantoprazole 40 MG tablet  Commonly known as:  PROTONIX  Take 1 tablet (40 mg total) by mouth daily.     pramipexole 1 MG tablet  Commonly known as:  MIRAPEX  TAKE 1 TABLET (1 MG TOTAL) BY MOUTH 3 (THREE) TIMES DAILY.        No orders of the defined types were placed in this encounter.    Immunization History  Administered Date(s) Administered  . Influenza Split 08/14/2011  . Influenza Whole 08/11/2009  . Influenza,inj,Quad PF,36+ Mos 04/08/2013, 04/21/2014  . Pneumococcal Conjugate-13 10/15/2013  . Pneumococcal Polysaccharide-23 07/15/2008  . Td 08/14/2004, 10/20/2014    History  Substance Use Topics  . Smoking status: Former Smoker -- 1.00 packs/day for 15 years    Types: Cigarettes    Quit date: 02/05/1977  . Smokeless tobacco: Never Used  . Alcohol Use: 0.5 oz/week    1 drink(s) per week     Comment: RARE    Family history is noncontributory    Review of Systems  DATA OBTAINED: from patient, nurse GENERAL:  no fevers, fatigue,not much appetite  SKIN: No itching, rash or wounds EYES: No eye pain, redness, discharge EARS: No earache, tinnitus, change in hearing NOSE: No congestion, drainage or bleeding  MOUTH/THROAT: No mouth or tooth pain, No sore throat RESPIRATORY: No cough, wheezing, SOB CARDIAC: No chest pain, palpitations, lower extremity edema  GI: No abdominal pain, am nausea No V/D or constipation, No heartburn or reflux  GU: No dysuria, frequency or urgency, or incontinence  MUSCULOSKELETAL: No unrelieved bone/joint pain NEUROLOGIC: No headache,  dizziness or focal weakness PSYCHIATRIC: No overt anxiety or sadness, No behavior issue.   Filed Vitals:   01/27/15 2143  BP: 174/77  Pulse: 74  Temp: 98.5 F (36.9 C)  Resp: 16    Physical Exam  GENERAL APPEARANCE: Alert, conversant,  No acute distress.  SKIN: No diaphoresis rash HEAD: Normocephalic, atraumatic  EYES: Conjunctiva/lids clear. Pupils round, reactive. EOMs intact.  EARS: External exam WNL, canals clear. Hearing grossly normal.  NOSE: No deformity or discharge.  MOUTH/THROAT: Lips w/o lesions  RESPIRATORY: Breathing is even, unlabored. Lung sounds  are clear   CARDIOVASCULAR: Heart RRR no murmurs, rubs or gallops. No peripheral edema.   GASTROINTESTINAL: Abdomen is soft, non-tender, not distended w/ normal bowel sounds; drain with clear brown fluid GENITOURINARY: Bladder non tender, not distended  MUSCULOSKELETAL: No abnormal joints or musculature NEUROLOGIC:  Cranial nerves 2-12 grossly intact. Moves all extremities  PSYCHIATRIC: Mood and affect appropriate to situation, no behavioral issues  Patient Active Problem List   Diagnosis Date Noted  . Cholecystostomy drain infection 01/25/2015  . Abdominal pain 01/18/2015  . Cholecystitis 01/18/2015  . Acute on chronic kidney failure 01/18/2015  . Right upper quadrant pain   . Acute cholecystitis   . Renal cyst 12/31/2012  . Hearing loss 07/05/2012  . Lesion of nose 07/05/2012  . Anemia, unspecified 04/02/2012  . Pulmonary emphysema 04/01/2012  . Hypertension 03/09/2012  . Pulmonary nodule, right 02/08/2012  . Mitral regurgitation 02/06/2012  . CAD (coronary artery disease) 01/04/2012  . Unspecified disorder of liver 12/18/2011  . Hyperthyroidism 12/18/2011  . Nonspecific abnormal findings on radiological and examination of lung field 12/18/2011  . Impaired glucose tolerance 02/09/2011  . Preventative health care 02/09/2011  . BRADYCARDIA 10/24/2010  . INSOMNIA-SLEEP DISORDER-UNSPEC 10/22/2009  .  PERIPHERAL EDEMA 10/22/2009  . HLD (hyperlipidemia) 07/30/2008  . Peripheral vascular disease 07/30/2008  . 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 Unspecified visual loss 07/15/2008  . SHOULDER PAIN, LEFT 01/22/2008  . HYPERSOMNIA 10/02/2007  . RESTLESS LEG SYNDROME 05/15/2007  . RHINITIS, ALLERGIC NOS 05/15/2007  . ERECTILE DYSFUNCTION 04/05/2007  . PEPTIC ULCER DISEASE 04/05/2007  . Osteoarthritis 04/05/2007  . LOW BACK PAIN 04/05/2007  . PROSTATE CANCER, HX OF 04/05/2007    CBC    Component Value Date/Time   WBC 15.1* 01/20/2015 0500   RBC 3.81* 01/20/2015 0500   HGB 10.8* 01/20/2015 0500   HCT 33.5* 01/20/2015 0500   PLT 112* 01/20/2015 0500   MCV 87.9 01/20/2015 0500   LYMPHSABS 1.9 01/17/2015 1852   MONOABS 1.0 01/17/2015 1852   EOSABS 0.2 01/17/2015 1852   BASOSABS 0.0 01/17/2015 1852    CMP     Component Value Date/Time   NA 138 01/20/2015 0500   K 3.6 01/20/2015 0500   CL 106 01/20/2015 0500   CO2 24 01/20/2015 0500   GLUCOSE 124* 01/20/2015 0500   BUN 26* 01/20/2015 0500   CREATININE 1.77* 01/20/2015 0500   CALCIUM 8.5* 01/20/2015 0500   PROT 5.6* 01/20/2015 0500   ALBUMIN 2.4* 01/20/2015 0500   AST 18 01/20/2015 0500   ALT 21 01/20/2015 0500   ALKPHOS 92 01/20/2015 0500   BILITOT 1.2 01/20/2015 0500   GFRNONAA 36* 01/20/2015 0500   GFRAA 42* 01/20/2015 0500    Assessment and Plan  Acute cholecystitis Pt presenting complaints of abdominal pain. He was worked up with abdominal ultrasound that revealed distended gallbladder with sludge displaying increased wall thickness. Also noted was dilated common bile duct. Findings consistent with an acute acalculous cholecystitis. General surgery was consulted who recommended patient undergo a percutaneous cholecystostomy tube placement by interventional radiology. This procedure was done by interventional radiology on 01/18/2015 without immediate complications. Body fluid cultures revealed abundant gram-negative  rods. He was treated with IV Zosyn and transitioned to oral Augmentin on 01/20/2015.resenting c/o   Cholecystostomy drain infection Abdominal ultrasound that revealed distended gallbladder with sludge displaying increased wall thickness. Also noted was dilated common bile duct. Findings consistent with an acute acalculous cholecystitis. General surgery was consulted who recommended patient undergo a percutaneous cholecystostomy tube placement  by interventional radiology. This procedure was done by interventional radiology on 01/18/2015 without immediate complications.  Hypertension Continue norvasc, clonidine, imdur  CAD (coronary artery disease) Continue imdur, ASA 81 mg and prn SLG nitro  Pulmonary emphysema On no MDI's, only claritin  Hyperthyroidism TSH 3.79-normal  Acute on chronic kidney failure 2/2 infection; resooved with IVF    Hennie Duos, MD

## 2015-01-25 NOTE — Assessment & Plan Note (Signed)
Abdominal ultrasound that revealed distended gallbladder with sludge displaying increased wall thickness. Also noted was dilated common bile duct. Findings consistent with an acute acalculous cholecystitis. General surgery was consulted who recommended patient undergo a percutaneous cholecystostomy tube placement by interventional radiology. This procedure was done by interventional radiology on 01/18/2015 without immediate complications.

## 2015-01-27 ENCOUNTER — Encounter: Payer: Self-pay | Admitting: Internal Medicine

## 2015-02-05 ENCOUNTER — Non-Acute Institutional Stay (SKILLED_NURSING_FACILITY): Payer: Medicare Other | Admitting: Nurse Practitioner

## 2015-02-05 DIAGNOSIS — K219 Gastro-esophageal reflux disease without esophagitis: Secondary | ICD-10-CM | POA: Diagnosis not present

## 2015-02-05 DIAGNOSIS — F329 Major depressive disorder, single episode, unspecified: Secondary | ICD-10-CM | POA: Diagnosis not present

## 2015-02-05 DIAGNOSIS — I10 Essential (primary) hypertension: Secondary | ICD-10-CM | POA: Diagnosis not present

## 2015-02-05 DIAGNOSIS — K810 Acute cholecystitis: Secondary | ICD-10-CM

## 2015-02-05 DIAGNOSIS — R5381 Other malaise: Secondary | ICD-10-CM

## 2015-02-05 DIAGNOSIS — I25119 Atherosclerotic heart disease of native coronary artery with unspecified angina pectoris: Secondary | ICD-10-CM

## 2015-02-05 DIAGNOSIS — F32A Depression, unspecified: Secondary | ICD-10-CM

## 2015-02-05 DIAGNOSIS — R911 Solitary pulmonary nodule: Secondary | ICD-10-CM | POA: Diagnosis not present

## 2015-02-05 NOTE — Progress Notes (Signed)
Patient ID: Victor Davis, male   DOB: 1941/07/23, 74 y.o.   MRN: [id number]    Nursing Home Location:  [facility]   Place of Service: SNF (31)  PCP: Cathlean Cower, MD  Allergies  Allergen Reactions  . Amitriptyline Hcl Other (See Comments)    REACTION: confusion  . Diphenhydramine Hcl Other (See Comments)    Keeps him awake  . Simvastatin Other (See Comments)    REACTION: leg pain    Chief Complaint  Patient presents with  . Discharge Note    HPI:  Patient is a 74 y.o. male seen today at [facility] for discharge home. Pt with a pmh of RLS, GERD, allergic rhinitis, CAD, hypertension, anxiety. Pt is currently at [facility] after hospitalization due to Acute cholecystitis. Pt presented to the ED with complaints of abdominal pain. He was worked up with abdominal ultrasound that revealed distended gallbladder with sludge displaying increased wall thickness. General surgery was consulted who recommended patient undergo a percutaneous cholecystostomy tube placement by interventional radiology. This procedure was done by interventional radiology on 01/18/2015 without immediate complications. Body fluid cultures revealed abundant gram-negative rods. He was treated with IV Zosyn and transitioned to oral Augmentin on 01/20/2015 which he has now completed. Patient currently doing well with therapy, now stable to discharge home with home health.   Review of Systems:  Review of Systems  Constitutional: Negative for fever, chills, activity change, appetite change, fatigue and unexpected weight change.  HENT: Negative for congestion and hearing loss.   Eyes: Negative.   Respiratory: Negative for cough and shortness of breath.   Cardiovascular: Negative for chest pain, palpitations and leg swelling.  Gastrointestinal: Negative for abdominal pain, diarrhea and constipation.  Genitourinary: Negative for dysuria and difficulty urinating.  Musculoskeletal: Negative for  myalgias and arthralgias.  Skin: Negative for color change and wound.  Neurological: Negative for dizziness and weakness.  Psychiatric/Behavioral: Negative for behavioral problems, confusion and agitation.    Past Medical History  Diagnosis Date  . BRADYCARDIA 10/24/2010  . CALF PAIN, RIGHT 10/22/2009  . 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 Dysuria 10/10/2010  . ERECTILE DYSFUNCTION 04/05/2007  . FATIGUE 10/02/2007  . GLUCOSE INTOLERANCE 07/15/2008  . HYPERLIPIDEMIA 07/30/2008  . HYPERSOMNIA 10/02/2007  . INSOMNIA-SLEEP DISORDER-UNSPEC 10/22/2009  . LOW BACK PAIN 04/05/2007  . OSTEOARTHRITIS 04/05/2007  . PEPTIC ULCER DISEASE 04/05/2007  . PERIPHERAL EDEMA 10/22/2009  . PERIPHERAL VASCULAR DISEASE 07/30/2008  . PROSTATE CANCER, HX OF 04/05/2007  . RESTLESS LEG SYNDROME 05/15/2007  . RHINITIS, ALLERGIC NOS 05/15/2007  . SHOULDER PAIN, LEFT 01/22/2008  . SINUSITIS- ACUTE-NOS 05/15/2007  . Unspecified essential hypertension 04/05/2007  . Unspecified visual loss 07/15/2008  . UTI 10/24/2010  . Impaired glucose tolerance 02/09/2011  . Coronary artery disease      95% followed by 80% mid LAD stenosis, circumflex 80% stenosis. There was a moderate size branching obtuse marginal with 90-95% stenosis in the inferior branch. The right coronary artery had proximal 30% stenosis. Posterior lateral was moderate-sized with 40% stenosis. The EF was 65-70%. He had a DES and  x 2 to the LAD and DES x 2 to the circumflex 03/08/12.    [name] GERD (gastroesophageal reflux disease)   . Emphysema 04/01/2012    By CT chest, august 2013  . CKD (chronic kidney disease), stage II    Past Surgical History  Procedure Laterality Date  . Inguinal herniorrhapy left  2002    x 2  . Rotator cuff  repair      left  . Knee surgery      left  . S/p ventral surgery  2009  . Prostatectomy  2002    radical  . Right hip replacement  09/22/09  . Left hip replacement    . Eye surgery      right  . Percutaneous coronary stent  intervention (pci-s) N/A 03/08/2012    Procedure: PERCUTANEOUS CORONARY STENT INTERVENTION (PCI-S);  Surgeon: Burnell Blanks, MD;  Location: [facility] CATH LAB;  Service: Cardiovascular;  Laterality: N/A;   Social History:   reports that he quit smoking about 38 years ago. His smoking use included Cigarettes. He has a 15 pack-year smoking history. He has never used smokeless tobacco. He reports that he drinks about 0.5 oz of alcohol per week. He reports that he does not use illicit drugs.  Family History  Problem Relation Age of Onset  . Heart attack Father 55    Smoker  . Heart attack Brother 58  . Colon cancer Neg Hx   . Lung cancer Sister     Medications: Patient's Medications  New Prescriptions   No medications on file  Previous Medications   ACETAMINOPHEN (TYLENOL) 325 MG TABLET    Take 2 tablets (650 mg total) by mouth every 6 (six) hours as needed for mild pain (or Fever >/= 101).   ALUM & MAG HYDROXIDE-SIMETH (MAALOX/MYLANTA) 200-200-20 MG/5ML SUSPENSION    Take 30 mLs by mouth every 6 (six) hours as needed for indigestion or heartburn.   AMLODIPINE (NORVASC) 10 MG TABLET    Take 1 tablet (10 mg total) by mouth daily.   ASPIRIN 81 MG EC TABLET    Take 81 mg by mouth daily.     CITALOPRAM (CELEXA) 40 MG TABLET    Take 1 tablet (40 mg total) by mouth daily.   CLONIDINE (CATAPRES) 0.1 MG TABLET    Take 1 tablet by mouth every AM and 2 tablets by mouth every PM   CLOPIDOGREL (PLAVIX) 75 MG TABLET    Take 1 tablet (75 mg total) by mouth daily.   ISOSORBIDE MONONITRATE (IMDUR) 30 MG 24 HR TABLET    TAKE 1 TABLET (30 MG TOTAL) BY MOUTH DAILY.   LORATADINE (CLARITIN) 10 MG TABLET    Take 10 mg by mouth daily.   NITROGLYCERIN (NITROSTAT) 0.4 MG SL TABLET    Place 1 tablet (0.4 mg total) under the tongue every 5 (five) minutes as needed for chest pain.   PANTOPRAZOLE (PROTONIX) 40 MG TABLET    Take 1 tablet (40 mg total) by mouth daily.   PRAMIPEXOLE (MIRAPEX) 1 MG TABLET    TAKE 1  TABLET (1 MG TOTAL) BY MOUTH 3 (THREE) TIMES DAILY.  Modified Medications   No medications on file  Discontinued Medications   AMOXICILLIN-CLAVULANATE (AUGMENTIN XR) 1000-62.5 MG PER TABLET    Take 2 tablets by mouth every 12 (twelve) hours.     Physical Exam: Filed Vitals:   02/05/15 1124  BP: 136/61  Pulse: 74  Temp: 97.5 F (36.4 C)  Resp: 18    Physical Exam  Constitutional: He is oriented to person, place, and time. He appears well-developed and well-nourished. No distress.  HENT:  Head: Normocephalic and atraumatic.  Mouth/Throat: Oropharynx is clear and moist. No oropharyngeal exudate.  Eyes: Conjunctivae and EOM are normal. Pupils are equal, round, and reactive to light.  Neck: Normal range of motion. Neck supple.  Cardiovascular: Normal rate, regular rhythm and normal  heart sounds.   Pulmonary/Chest: Effort normal and breath sounds normal.  Abdominal: Soft. Bowel sounds are normal.  cholecystostomy tube   Musculoskeletal: He exhibits no edema or tenderness.  Neurological: He is alert and oriented to person, place, and time.  Skin: Skin is warm and dry. He is not diaphoretic.  Psychiatric: He has a normal mood and affect.    Labs reviewed: Basic Metabolic Panel:  Recent Labs  01/18/15 0315 01/19/15 0458 01/20/15 0500  NA 141 141 138  K 3.9 3.9 3.6  CL 105 107 106  CO2 '26 25 24  '$ GLUCOSE 174* 145* 124*  BUN 20 23* 26*  CREATININE 1.53* 1.71* 1.77*  CALCIUM 8.8* 8.7* 8.5*   Liver Function Tests:  Recent Labs  01/18/15 0315 01/19/15 0458 01/20/15 0500  AST '26 30 18  '$ ALT '22 28 21  '$ ALKPHOS 78 90 92  BILITOT 1.1 1.8* 1.2  PROT 6.5 5.5* 5.6*  ALBUMIN 3.7 2.7* 2.4*    Recent Labs  01/17/15 1852  LIPASE 16*   No results for input(s): AMMONIA in the last 8760 hours. CBC:  Recent Labs  04/21/14 1203 10/20/14 1013 01/17/15 1852 01/18/15 0315 01/19/15 0458 01/20/15 0500  WBC 9.4 7.8 12.8* 12.3* 15.0* 15.1*  NEUTROABS 6.3 5.0 9.7*  --   --    --   HGB 13.7 12.4* 13.1 12.3* 12.1* 10.8*  HCT 41.0 36.5* 39.7 37.7* 37.2* 33.5*  MCV 89.1 85.7 87.4 87.1 87.9 87.9  PLT 175.0 179.0 162 134* 125* 112*   TSH:  Recent Labs  04/21/14 1203 10/20/14 1013  TSH 2.71 3.79   A1C: Lab Results  Component Value Date   HGBA1C 5.8 04/21/2014   Lipid Panel:  Recent Labs  04/21/14 1203 10/20/14 1013  CHOL 158 133  HDL 40.30 35.80*  LDLCALC 94 82  TRIG 119.0 77.0  CHOLHDL 4 4     Assessment/Plan 1. Acute cholecystitis S/p percutaneous cholecystostomy tube. -currently stable, minimal drainage, pt emptying bag on his own. Has follow up with surgery scheduled.   2. Essential hypertension Continue on norvasc, clonidine, imdur  3. Coronary artery disease involving native coronary artery of native heart with angina pectoris Continue on imdur, ASA 81 mg and prn SLG nitro  4. Lung nodule -ongoing outpatient follow up with pulmonary and surgical  5. Depression -conts on celexa 40 mg daily  6. GERD conts on protonix 40 mg daily  7. Physical Deconditioning  Has improved while at [facility] pt is stable for discharge-will need PT/OT/Nursing per home health. DME needed 3-1, Rx written.  will need to follow up with PCP and surgery within 2 weeks.    Carlos American. Harle Battiest  [facility] [phone] 8 am - 5 pm) [phone] (after hours)

## 2015-02-08 ENCOUNTER — Other Ambulatory Visit: Payer: Self-pay | Admitting: Thoracic Surgery (Cardiothoracic Vascular Surgery)

## 2015-02-08 DIAGNOSIS — R918 Other nonspecific abnormal finding of lung field: Secondary | ICD-10-CM

## 2015-02-11 ENCOUNTER — Other Ambulatory Visit: Payer: Self-pay | Admitting: General Surgery

## 2015-02-11 ENCOUNTER — Other Ambulatory Visit: Payer: Self-pay

## 2015-02-11 ENCOUNTER — Other Ambulatory Visit (HOSPITAL_COMMUNITY): Payer: Self-pay | Admitting: Interventional Radiology

## 2015-02-11 DIAGNOSIS — K810 Acute cholecystitis: Secondary | ICD-10-CM

## 2015-02-11 DIAGNOSIS — Z434 Encounter for attention to other artificial openings of digestive tract: Secondary | ICD-10-CM | POA: Diagnosis not present

## 2015-02-11 DIAGNOSIS — N183 Chronic kidney disease, stage 3 (moderate): Secondary | ICD-10-CM | POA: Diagnosis not present

## 2015-02-11 DIAGNOSIS — I10 Essential (primary) hypertension: Secondary | ICD-10-CM | POA: Diagnosis not present

## 2015-02-11 DIAGNOSIS — I2510 Atherosclerotic heart disease of native coronary artery without angina pectoris: Secondary | ICD-10-CM | POA: Diagnosis not present

## 2015-02-19 ENCOUNTER — Other Ambulatory Visit (HOSPITAL_COMMUNITY): Payer: Self-pay | Admitting: Interventional Radiology

## 2015-02-19 ENCOUNTER — Telehealth: Payer: Self-pay | Admitting: *Deleted

## 2015-02-19 ENCOUNTER — Ambulatory Visit (HOSPITAL_COMMUNITY)
Admission: RE | Admit: 2015-02-19 | Discharge: 2015-02-19 | Disposition: A | Discharge status: Home / Self Care | Payer: Medicare Other | Source: Ambulatory Visit | Attending: Interventional Radiology | Admitting: Interventional Radiology

## 2015-02-19 DIAGNOSIS — Z4803 Encounter for change or removal of drains: Secondary | ICD-10-CM | POA: Insufficient documentation

## 2015-02-19 DIAGNOSIS — L539 Erythematous condition, unspecified: Secondary | ICD-10-CM | POA: Diagnosis not present

## 2015-02-19 DIAGNOSIS — K810 Acute cholecystitis: Secondary | ICD-10-CM | POA: Diagnosis not present

## 2015-02-19 MED ORDER — IOHEXOL 300 MG/ML  SOLN
50.0000 mL | Freq: Once | INTRAMUSCULAR | Status: AC | PRN
  Administered 2015-02-19: 10 mL

## 2015-02-19 NOTE — Procedures (Signed)
Proc: chole drain check Find: Cystic and CB ducts patent Int: Chole drain removed Comp: 0 EBL: 0

## 2015-02-19 NOTE — Telephone Encounter (Signed)
Pt wife [name] called to say husband drain site is red,draining, warm to touch and badly hurting. No bad smell. I sw Dr watts and he asked for the pt to come by [facility] around noon today and Dr Barbie Banner will evaluate the site.

## 2015-02-23 ENCOUNTER — Other Ambulatory Visit: Payer: Self-pay | Admitting: *Deleted

## 2015-02-23 ENCOUNTER — Ambulatory Visit (INDEPENDENT_AMBULATORY_CARE_PROVIDER_SITE_OTHER): Payer: Medicare Other | Admitting: Thoracic Surgery (Cardiothoracic Vascular Surgery)

## 2015-02-23 ENCOUNTER — Encounter: Payer: Self-pay | Admitting: Thoracic Surgery (Cardiothoracic Vascular Surgery)

## 2015-02-23 ENCOUNTER — Ambulatory Visit
Admission: RE | Admit: 2015-02-23 | Discharge: 2015-02-23 | Disposition: A | Discharge status: Home / Self Care | Payer: Medicare Other | Source: Ambulatory Visit | Attending: Thoracic Surgery (Cardiothoracic Vascular Surgery) | Admitting: Thoracic Surgery (Cardiothoracic Vascular Surgery)

## 2015-02-23 VITALS — BP 118/71 | HR 81 | Resp 16 | Ht 67.0 in | Wt 159.0 lb

## 2015-02-23 DIAGNOSIS — R911 Solitary pulmonary nodule: Secondary | ICD-10-CM

## 2015-02-23 DIAGNOSIS — R918 Other nonspecific abnormal finding of lung field: Secondary | ICD-10-CM

## 2015-02-23 DIAGNOSIS — J90 Pleural effusion, not elsewhere classified: Secondary | ICD-10-CM | POA: Diagnosis not present

## 2015-02-23 NOTE — Patient Instructions (Signed)
Stop plavix on 03/17/2015 prior to surgery on 03/22/2015

## 2015-02-23 NOTE — Progress Notes (Signed)
[address]       [place] [address]             [phone]       HPI: Victor Davis returns for a scheduled 3 month follow-up visit regarding his groundglass opacity in the right lower lobe.  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 He has a history of tobacco abuse, smoking 2 packs of cigarettes daily for about 15 years prior to quitting in 1978. His past medical history is extensive and includes a cerebrovascular accident, coronary artery disease with stenting by Dr. Angelena Form, bradycardia, prostate cancer, arthritis, hypertension, hyperlipidemia, reflux, and emphysema. He saw Dr. Jenny Reichmann back in 2013 with a complaint of cough and shortness of breath. Chest x-ray showed a possible nodule. A CT of the chest was done and showed an 8 mm groundglass opacity in the superior segment of the right lower lobe. A year later (August 2014) the nodule measured 9 mm. On repeat CT in March 2016 the nodule had increased in size from 9 to 12 mm. There were no other suspicious findings.  I saw him around that time recommended surgical resection. His wife had just been diagnosed with breast cancer and he did not want to have surgery. He asked to delay surgery until now. He returns with a repeat CT scan.  Despite his numerous health problems Victor Davis claims that he is doing well. He did have cholecystitis recently and had a percutaneous drain placed for that. He is not having any issues related to that currently. He gets short of breath when he is mowing his yard. He does not have shortness of breath walking up and down his driveway. He denies any chest pain, pressure, or tightness. His weight has been stable. He has an occasional cough, but no hemoptysis. He denies wheezing. He does have reflux and occasional indigestion, as well as arthritis. He has been under a great deal of stress recently because his wife  is currently undergoing chemotherapy for breast cancer.  Past Medical History  Diagnosis Date  . BRADYCARDIA 10/24/2010  . CALF PAIN, RIGHT 10/22/2009  . 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 Dysuria 10/10/2010  . ERECTILE DYSFUNCTION 04/05/2007  . FATIGUE 10/02/2007  . GLUCOSE INTOLERANCE 07/15/2008  . HYPERLIPIDEMIA 07/30/2008  . HYPERSOMNIA 10/02/2007  . INSOMNIA-SLEEP DISORDER-UNSPEC 10/22/2009  . LOW BACK PAIN 04/05/2007  . OSTEOARTHRITIS 04/05/2007  . PEPTIC ULCER DISEASE 04/05/2007  . PERIPHERAL EDEMA 10/22/2009  . PERIPHERAL VASCULAR DISEASE 07/30/2008  . PROSTATE CANCER, HX OF 04/05/2007  . RESTLESS LEG SYNDROME 05/15/2007  . RHINITIS, ALLERGIC NOS 05/15/2007  . SHOULDER PAIN, LEFT 01/22/2008  . SINUSITIS- ACUTE-NOS 05/15/2007  . Unspecified essential hypertension 04/05/2007  . Unspecified visual loss 07/15/2008  . UTI 10/24/2010  . Impaired glucose tolerance 02/09/2011  . Coronary artery disease      95% followed by 80% mid LAD stenosis, circumflex 80% stenosis. There was a moderate size branching obtuse marginal with 90-95% stenosis in the inferior branch. The right coronary artery had proximal 30% stenosis. Posterior lateral was moderate-sized with 40% stenosis. The EF was 65-70%. He had a DES and  x 2 to the LAD and DES x 2 to the circumflex 03/08/12.    [name] GERD (gastroesophageal reflux disease)   . Emphysema 04/01/2012    By CT chest, august 2013  . CKD (chronic kidney disease), stage II  Past Surgical History  Procedure Laterality Date  . Inguinal herniorrhapy left  2002    x 2  . Rotator cuff repair      left  . Knee surgery      left  . S/p ventral surgery  2009  . Prostatectomy  2002    radical  . Right hip replacement  09/22/09  . Left hip replacement    . Eye surgery      right  . Percutaneous coronary stent intervention (pci-s) N/A 03/08/2012    Procedure: PERCUTANEOUS CORONARY STENT INTERVENTION (PCI-S);  Surgeon: Burnell Blanks, MD;  Location: [facility] CATH LAB;   Service: Cardiovascular;  Laterality: N/A;      Current Outpatient Prescriptions  Medication Sig Dispense Refill  . acetaminophen (TYLENOL) 325 MG tablet Take 2 tablets (650 mg total) by mouth every 6 (six) hours as needed for mild pain (or Fever >/= 101). 30 tablet 1  . alum & mag hydroxide-simeth (MAALOX/MYLANTA) 200-200-20 MG/5ML suspension Take 30 mLs by mouth every 6 (six) hours as needed for indigestion or heartburn.    [name] amLODipine (NORVASC) 10 MG tablet Take 1 tablet (10 mg total) by mouth daily. 90 tablet 3  . aspirin 81 MG EC tablet Take 81 mg by mouth daily.      . citalopram (CELEXA) 40 MG tablet Take 1 tablet (40 mg total) by mouth daily. 90 tablet 3  . cloNIDine (CATAPRES) 0.1 MG tablet Take 1 tablet by mouth every AM and 2 tablets by mouth every PM 270 tablet 3  . clopidogrel (PLAVIX) 75 MG tablet Take 1 tablet (75 mg total) by mouth daily. 90 tablet 1  . isosorbide mononitrate (IMDUR) 30 MG 24 hr tablet TAKE 1 TABLET (30 MG TOTAL) BY MOUTH DAILY. 30 tablet 5  . loratadine (CLARITIN) 10 MG tablet Take 10 mg by mouth daily.    . nitroGLYCERIN (NITROSTAT) 0.4 MG SL tablet Place 1 tablet (0.4 mg total) under the tongue every 5 (five) minutes as needed for chest pain. 25 tablet 6  . pantoprazole (PROTONIX) 40 MG tablet Take 1 tablet (40 mg total) by mouth daily. 30 tablet 11  . pramipexole (MIRAPEX) 1 MG tablet TAKE 1 TABLET (1 MG TOTAL) BY MOUTH 3 (THREE) TIMES DAILY. 90 tablet 5   No current facility-administered medications for this visit.    Physical Exam BP 118/71 mmHg  Pulse 81  Resp 16  Ht '5\' 7"'$  (1.702 m)  Wt 159 lb (72.122 kg)  BMI 24.90 kg/m2  SpO57 96% 74 year old man in no acute distress Well-developed and well-nourished Alert and oriented 3 with no focal neurologic deficits No cervical or subclavicular adenopathy Lungs clear bilaterally Cardiac regular rate and rhythm normal S1 and S2 no rubs or murmurs Abdomen soft and nontender nondistended Extremities  without clubbing cyanosis or edema Skin : Rash under her dressing where percutaneous drain had been in place.  Diagnostic Tests: CT CHEST WITHOUT CONTRAST  TECHNIQUE: Multidetector CT imaging of the chest was performed following the standard protocol without IV contrast.  COMPARISON: CT 11/05/2014 go  FINDINGS: Mediastinum/Nodes: No axillary supraclavicular adenopathy. No mediastinal hilar adenopathy. No pericardial fluid. Coronary calcifications are present. Esophagus normal.  Lungs/Pleura: The peripheral ground-glass nodule in the superior segment right lower lobe is increase in size measuring 16 mm on image 20, series 4 increased from 12 mm on 11/05/2014 and 9 mm on 04/08/2013.  There is a new small right pleural effusion. No new pulmonary nodules  Upper abdomen: Limited  view of the liver, kidneys, pancreas are unremarkable. Normal adrenal glands.  Musculoskeletal: No aggressive osseous lesion.  IMPRESSION: 1. Mild progressive enlargement of ground-glass nodule in the superior segment of the right lower lobe. Recommend percutaneous biopsy (if assessable) to evaluate for a slow-growing adenocarcinoma. 2. New right pleural effusion.   Electronically Signed  By: Suzy Bouchard M.D.  On: 02/23/2015 14:20 I personally reviewed the CT scanning compared to his previous scan. I concur with the above findings. The pleural effusion is likely related to his cholecystitis. \ Impression: Victor Davis is a 74 year old gentleman with a groundglass opacity in the superior segment of the right lower lobe. This had very slowly progressed over time for a couple of years. However over the past 3 months it has gotten significantly bigger going from 12-16 mm. This is highly suspicious for an adenocarcinoma. He also has a new right pleural effusion on his scan. I strongly suspect this is related to his cholecystitis and percutaneous drain. I cannot completely rule out the  possibility of malignant effusion, but I think that is unlikely. I recommended that we proceed with right VATS and superior segmentectomy of right lower lobe. He wanted to delay this again due to his wife's chemotherapy. However, at this point I do not think that is wise given the significant change in size over the past 3 months. I reviewed the general nature of the procedure, the need for general anesthesia, the incisions to be used, the use of drainage tubes postoperatively, the expected hospital stay, and the overall recovery. I reviewed the indications, risks, benefits, and alternatives. He understands the risks include, but are not limited to death, MI, DVT, PE, stroke, bleeding, possible need for transfusion, infection, prolonged air leak, cardiac arrhythmias, as well as the possibility of other unforeseeable complications.   He accepts the risk and agrees to proceed. He does want to wait until after his wife's next chemotherapy cycle which is scheduled to be given on August 4.   Plan:  Right VATS, Right lower lobe superior segmentectomy on Monday 03/22/2015  I spent 25 minutes face to face with Victor Davis during this visit. > 50% of the time was spent in counseling Melrose Nakayama, MD [facility] [phone]

## 2015-02-24 ENCOUNTER — Encounter: Payer: Self-pay | Admitting: Thoracic Surgery (Cardiothoracic Vascular Surgery)

## 2015-02-24 NOTE — Progress Notes (Signed)
Patient ID: Victor Davis, male   DOB: November 13, 1940, 74 y.o.   MRN: [id number]  Checked with Dr. Angelena Form. OK to hold plavix preop  Revonda Standard. Roxan Hockey, MD [facility] [phone]

## 2015-02-25 DIAGNOSIS — C61 Malignant neoplasm of prostate: Secondary | ICD-10-CM | POA: Diagnosis not present

## 2015-03-02 ENCOUNTER — Other Ambulatory Visit: Payer: Medicare Other

## 2015-03-03 ENCOUNTER — Telehealth: Payer: Self-pay

## 2015-03-03 NOTE — Telephone Encounter (Signed)
-----   Message from Melrose Nakayama, MD sent at 03/02/2015  5:33 PM EDT ----- Regarding: RE: stopping Plavix Contact: [phone] Stop it after his dose on 8/3  Thanks  [facility] ----- Message -----    From: Marylen Ponto, LPN    Sent: [id number]/[id number]   5:21 PM      To: Melrose Nakayama, MD Subject: stopping Plavix                                Mr. Cleary is sch'ed for Right VATS, Right lower lobe superior segmentectomy on Monday 03/22/2015. He is currently on Plavix 75 mg daily. What date would you like him to stop. Please Advise SW

## 2015-03-03 NOTE — Telephone Encounter (Signed)
Stop it after his dose on 8/3

## 2015-03-04 DIAGNOSIS — N5201 Erectile dysfunction due to arterial insufficiency: Secondary | ICD-10-CM | POA: Diagnosis not present

## 2015-03-04 DIAGNOSIS — N393 Stress incontinence (female) (male): Secondary | ICD-10-CM | POA: Diagnosis not present

## 2015-03-04 DIAGNOSIS — C61 Malignant neoplasm of prostate: Secondary | ICD-10-CM | POA: Diagnosis not present

## 2015-03-05 ENCOUNTER — Other Ambulatory Visit: Payer: Self-pay | Admitting: Nurse Practitioner

## 2015-03-18 ENCOUNTER — Other Ambulatory Visit (HOSPITAL_COMMUNITY): Payer: Medicare Other

## 2015-03-18 NOTE — Pre-Procedure Instructions (Addendum)
    Victor Davis  03/18/2015      Your procedure is scheduled on Monday, August 8.  Report to [facility] Admitting at 5:30 A.M.   Call this number if you have problems the morning of surgery: [phone]   Remember:  Do not eat food or drink liquids after midnight Sunday, August 7.  Take these medicines the morning of surgery with A SIP OF WATER :amLODipine (NORVASC), citalopram (CELEXA), cloNIDine (CATAPRES), isosorbide mononitrate (IMDUR), loratadine (CLARITIN), pantoprazole (PROTONIX), pramipexole (MIRAPEX).             Take if needed: nitroGLYCERIN (NITROSTAT), acetaminophen (TYLENOL).                Plavix stopped 03/17/15.                   Stop taking ibuprofen (ADVIL,MOTRIN) and Vitamins.   Do not wear jewelry, make-up or nail polish.  Do not wear lotions, powders, or perfumes.    Men may shave face and neck.  Do not bring valuables to the hospital.  [facility] is not responsible for any belongings or valuables.  Contacts, dentures or bridgework may not be worn into surgery.  Leave your suitcase in the car.  After surgery it may be brought to your room.  For patients admitted to the hospital, discharge time will be determined by your treatment team.  Special instructions:  Review  [facility] - Preparing For Surgery.  Please read over the following fact sheets that you were given. Pain Booklet, Coughing and Deep Breathing, Blood Transfusion Information and Surgical Site Infection Prevention

## 2015-03-19 ENCOUNTER — Other Ambulatory Visit: Payer: Self-pay

## 2015-03-19 ENCOUNTER — Encounter (HOSPITAL_COMMUNITY)
Admission: RE | Admit: 2015-03-19 | Discharge: 2015-03-19 | Disposition: A | Discharge status: Home / Self Care | Payer: Medicare Other | Source: Ambulatory Visit | Attending: Thoracic Surgery (Cardiothoracic Vascular Surgery) | Admitting: Thoracic Surgery (Cardiothoracic Vascular Surgery)

## 2015-03-19 VITALS — BP 137/72 | HR 67 | Temp 98.2°F | Resp 20 | Ht 68.0 in | Wt 149.5 lb

## 2015-03-19 DIAGNOSIS — Z7902 Long term (current) use of antithrombotics/antiplatelets: Secondary | ICD-10-CM | POA: Insufficient documentation

## 2015-03-19 DIAGNOSIS — K30 Functional dyspepsia: Secondary | ICD-10-CM | POA: Diagnosis not present

## 2015-03-19 DIAGNOSIS — D649 Anemia, unspecified: Secondary | ICD-10-CM | POA: Diagnosis not present

## 2015-03-19 DIAGNOSIS — E785 Hyperlipidemia, unspecified: Secondary | ICD-10-CM

## 2015-03-19 DIAGNOSIS — M1990 Unspecified osteoarthritis, unspecified site: Secondary | ICD-10-CM | POA: Diagnosis not present

## 2015-03-19 DIAGNOSIS — R911 Solitary pulmonary nodule: Secondary | ICD-10-CM

## 2015-03-19 DIAGNOSIS — I129 Hypertensive chronic kidney disease with stage 1 through stage 4 chronic kidney disease, or unspecified chronic kidney disease: Secondary | ICD-10-CM | POA: Insufficient documentation

## 2015-03-19 DIAGNOSIS — Z7982 Long term (current) use of aspirin: Secondary | ICD-10-CM | POA: Insufficient documentation

## 2015-03-19 DIAGNOSIS — I2510 Atherosclerotic heart disease of native coronary artery without angina pectoris: Secondary | ICD-10-CM

## 2015-03-19 DIAGNOSIS — Z87891 Personal history of nicotine dependence: Secondary | ICD-10-CM

## 2015-03-19 DIAGNOSIS — C3431 Malignant neoplasm of lower lobe, right bronchus or lung: Secondary | ICD-10-CM | POA: Diagnosis not present

## 2015-03-19 DIAGNOSIS — Z8673 Personal history of transient ischemic attack (TIA), and cerebral infarction without residual deficits: Secondary | ICD-10-CM | POA: Insufficient documentation

## 2015-03-19 DIAGNOSIS — J439 Emphysema, unspecified: Secondary | ICD-10-CM

## 2015-03-19 DIAGNOSIS — I739 Peripheral vascular disease, unspecified: Secondary | ICD-10-CM

## 2015-03-19 DIAGNOSIS — N182 Chronic kidney disease, stage 2 (mild): Secondary | ICD-10-CM

## 2015-03-19 DIAGNOSIS — K819 Cholecystitis, unspecified: Secondary | ICD-10-CM | POA: Diagnosis not present

## 2015-03-19 DIAGNOSIS — Z01812 Encounter for preprocedural laboratory examination: Secondary | ICD-10-CM

## 2015-03-19 DIAGNOSIS — J90 Pleural effusion, not elsewhere classified: Secondary | ICD-10-CM | POA: Diagnosis not present

## 2015-03-19 DIAGNOSIS — K219 Gastro-esophageal reflux disease without esophagitis: Secondary | ICD-10-CM | POA: Diagnosis not present

## 2015-03-19 DIAGNOSIS — Z8546 Personal history of malignant neoplasm of prostate: Secondary | ICD-10-CM | POA: Insufficient documentation

## 2015-03-19 DIAGNOSIS — Z9689 Presence of other specified functional implants: Secondary | ICD-10-CM | POA: Diagnosis not present

## 2015-03-19 DIAGNOSIS — Z01818 Encounter for other preprocedural examination: Secondary | ICD-10-CM | POA: Insufficient documentation

## 2015-03-19 DIAGNOSIS — R001 Bradycardia, unspecified: Secondary | ICD-10-CM

## 2015-03-19 DIAGNOSIS — Z79899 Other long term (current) drug therapy: Secondary | ICD-10-CM | POA: Insufficient documentation

## 2015-03-19 DIAGNOSIS — Z0183 Encounter for blood typing: Secondary | ICD-10-CM | POA: Insufficient documentation

## 2015-03-19 DIAGNOSIS — Z955 Presence of coronary angioplasty implant and graft: Secondary | ICD-10-CM | POA: Diagnosis not present

## 2015-03-19 LAB — CBC
HCT: 35.1 % — ABNORMAL LOW (ref 39.0–52.0)
HEMOGLOBIN: 11.6 g/dL — AB (ref 13.0–17.0)
MCH: 28.9 pg (ref 26.0–34.0)
MCHC: 33 g/dL (ref 30.0–36.0)
MCV: 87.5 fL (ref 78.0–100.0)
PLATELETS: 132 10*3/uL — AB (ref 150–400)
RBC: 4.01 MIL/uL — ABNORMAL LOW (ref 4.22–5.81)
RDW: 13.7 % (ref 11.5–15.5)
WBC: 6.5 10*3/uL (ref 4.0–10.5)

## 2015-03-19 LAB — URINALYSIS, ROUTINE W REFLEX MICROSCOPIC
Bilirubin Urine: NEGATIVE
Glucose, UA: NEGATIVE mg/dL
Hgb urine dipstick: NEGATIVE
Ketones, ur: NEGATIVE mg/dL
LEUKOCYTES UA: NEGATIVE
Nitrite: NEGATIVE
Protein, ur: 100 mg/dL — AB
Specific Gravity, Urine: 1.014 (ref 1.005–1.030)
Urobilinogen, UA: 0.2 mg/dL (ref 0.0–1.0)
pH: 5.5 (ref 5.0–8.0)

## 2015-03-19 LAB — APTT: APTT: 27 s (ref 24–37)

## 2015-03-19 LAB — COMPREHENSIVE METABOLIC PANEL
ALBUMIN: 3.5 g/dL (ref 3.5–5.0)
ALT: 11 U/L — AB (ref 17–63)
AST: 17 U/L (ref 15–41)
Alkaline Phosphatase: 72 U/L (ref 38–126)
Anion gap: 7 (ref 5–15)
BUN: 20 mg/dL (ref 6–20)
CO2: 23 mmol/L (ref 22–32)
CREATININE: 1.54 mg/dL — AB (ref 0.61–1.24)
Calcium: 9.2 mg/dL (ref 8.9–10.3)
Chloride: 112 mmol/L — ABNORMAL HIGH (ref 101–111)
GFR, EST AFRICAN AMERICAN: 49 mL/min — AB (ref >60)
GFR, EST NON AFRICAN AMERICAN: 42 mL/min — AB (ref >60)
Glucose, Bld: 103 mg/dL — ABNORMAL HIGH (ref 65–99)
Potassium: 4.3 mmol/L (ref 3.5–5.1)
Sodium: 142 mmol/L (ref 135–145)
Total Bilirubin: 1.1 mg/dL (ref 0.3–1.2)
Total Protein: 6.3 g/dL — ABNORMAL LOW (ref 6.5–8.1)

## 2015-03-19 LAB — BLOOD GAS, ARTERIAL
Acid-base deficit: 1.1 mmol/L (ref 0.0–2.0)
BICARBONATE: 22.8 meq/L (ref 20.0–24.0)
Drawn by: 428831
O2 Content: 21 L/min
O2 Saturation: 98.7 %
PCO2 ART: 36.4 mmHg (ref 35.0–45.0)
PO2 ART: 119 mmHg — AB (ref 80.0–100.0)
Patient temperature: 98.6
TCO2: 24 mmol/L (ref 0–100)
pH, Arterial: 7.414 (ref 7.350–7.450)

## 2015-03-19 LAB — TYPE AND SCREEN
ABO/RH(D): A NEG
Antibody Screen: NEGATIVE

## 2015-03-19 LAB — PROTIME-INR
INR: 1.03 (ref 0.00–1.49)
PROTHROMBIN TIME: 13.7 s (ref 11.6–15.2)

## 2015-03-19 LAB — URINE MICROSCOPIC-ADD ON

## 2015-03-19 LAB — SURGICAL PCR SCREEN
MRSA, PCR: POSITIVE — AB
Staphylococcus aureus: POSITIVE — AB

## 2015-03-19 NOTE — Progress Notes (Addendum)
PCP: Dr. Cathlean Cower Cardiologist: Dr. Carl Best in Prescription for mupirocin ointment at [facility] at [facility] dr.

## 2015-03-19 NOTE — Progress Notes (Signed)
Anesthesia Chart Review: Patient is a 74 year old male scheduled for right VATS, right LLL segmentectomy on 03/22/15 by Dr. Roxan Hockey.  History includes former smoker, bradycardia, CAD s/p PCI '13, HTN, CVA, ED, HLD, glucose intolerance, PUD, prostate cancer s/p prostatectomy '02, PVD, RLS, GERD, hypersomnia, emphysema, CKD stage II, left THA, right THA '11, [facility] '09, cholecystitis s/p percutaneous drain 01/18/15 (removed 02/19/15). PCP is Dr. Cathlean Cower. Cardiologist is Dr. Angelena Form, last visit 08/20/14.  Meds include amlodipine, ASA, Celexa, Plavix, Imdur, lovastatin, Protonix, Mirapex.   Dr. Roxan Hockey asked him to hold Plavix starting 03/17/15. His notation states that he got permission to hold Plavix by Dr. Angelena Form.  03/19/15 EKG: SB at 56 bpm, PAC, septal infarct (age undetermined).  02/12/12 Cardiac cath: Impression: 1. 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 (40% proximal LAD, 95% and 80% mid LAD. 80% proximal CX, inferior sub-branch 90-95%. 30% proximal, mid, distal RCA. 40% PL branch). 2. Preserved LV systolic function. LVEF 65-70%. 3. Stress myoview suggesting apical ischemia with exercise EKG with ST depression.  Recommendations: He has double vessel CAD which I feel can be approached with PCI. Will stage PCI of the LAD and Circumflex later in the week. Will load with Plavix 600 mg po x 1 today. He will be started on ASA and Plavix daily until the PCI.  03/08/12 Cardiac cath (delayed following 02/12/12 cath due to worsening renal function following his 02/12/12 LHC):  Impression: 1. Successful PTC/DES x 2 LAD 2. Successful PTC/DES x 2 Circumflex  07/16/08 Echo:  SUMMARY - Overall left ventricular systolic function was normal. Left    ventricular ejection fraction was estimated , range being 60    % to 65 %. - There was mild mitral valvular regurgitation.  11/16/14 Carotid duplex: Heterogeneous plaque, bilaterally. Stable 40-59%  bilateral ICA stenosis. Normal [place] arteries, bilaterally. Patent vertebral arteries with antegrade flow.   03/19/15 CXR: IMPRESSION: No acute cardiopulmonary disease.  Preoperative labs noted. Cr 1.54, stable (last Cr 1.77 01/20/15). Glucose 103. H/H 11.6/35.1. PLT 132. PT/PTT WNL.  If no acute changes then I anticipate that he can proceed as planned.  George Hugh [facility]/Anesthesiology Phone [phone] 03/19/2015 1:51 PM

## 2015-03-21 MED ORDER — CEFUROXIME SODIUM 1.5 G IJ SOLR
1.5000 g | INTRAMUSCULAR | Status: AC
  Administered 2015-03-22: 1.5 g via INTRAVENOUS
  Filled 2015-03-21: qty 1.5

## 2015-03-22 ENCOUNTER — Inpatient Hospital Stay (HOSPITAL_COMMUNITY): Payer: Medicare Other | Admitting: Vascular Surgery

## 2015-03-22 ENCOUNTER — Encounter (HOSPITAL_COMMUNITY)
Admission: RE | Disposition: A | Discharge status: Home / Self Care | Payer: Self-pay | Source: Ambulatory Visit | Attending: Thoracic Surgery (Cardiothoracic Vascular Surgery)

## 2015-03-22 ENCOUNTER — Inpatient Hospital Stay (HOSPITAL_COMMUNITY): Payer: Medicare Other | Admitting: Certified Registered Nurse Anesthetist

## 2015-03-22 ENCOUNTER — Inpatient Hospital Stay (HOSPITAL_COMMUNITY): Payer: Medicare Other

## 2015-03-22 ENCOUNTER — Inpatient Hospital Stay (HOSPITAL_COMMUNITY)
Admission: RE | Admit: 2015-03-22 | Discharge: 2015-03-27 | DRG: 164 | Disposition: A | Discharge status: Home / Self Care | Payer: Medicare Other | Source: Ambulatory Visit | Attending: Thoracic Surgery (Cardiothoracic Vascular Surgery) | Admitting: Thoracic Surgery (Cardiothoracic Vascular Surgery)

## 2015-03-22 ENCOUNTER — Encounter (HOSPITAL_COMMUNITY): Payer: Self-pay | Admitting: *Deleted

## 2015-03-22 DIAGNOSIS — J90 Pleural effusion, not elsewhere classified: Secondary | ICD-10-CM | POA: Diagnosis present

## 2015-03-22 DIAGNOSIS — E785 Hyperlipidemia, unspecified: Secondary | ICD-10-CM | POA: Diagnosis present

## 2015-03-22 DIAGNOSIS — C3431 Malignant neoplasm of lower lobe, right bronchus or lung: Principal | ICD-10-CM | POA: Diagnosis present

## 2015-03-22 DIAGNOSIS — M1990 Unspecified osteoarthritis, unspecified site: Secondary | ICD-10-CM | POA: Diagnosis present

## 2015-03-22 DIAGNOSIS — N182 Chronic kidney disease, stage 2 (mild): Secondary | ICD-10-CM | POA: Diagnosis present

## 2015-03-22 DIAGNOSIS — I2510 Atherosclerotic heart disease of native coronary artery without angina pectoris: Secondary | ICD-10-CM | POA: Diagnosis present

## 2015-03-22 DIAGNOSIS — K819 Cholecystitis, unspecified: Secondary | ICD-10-CM | POA: Diagnosis present

## 2015-03-22 DIAGNOSIS — Z9689 Presence of other specified functional implants: Secondary | ICD-10-CM | POA: Diagnosis present

## 2015-03-22 DIAGNOSIS — Z8546 Personal history of malignant neoplasm of prostate: Secondary | ICD-10-CM | POA: Diagnosis not present

## 2015-03-22 DIAGNOSIS — Z9889 Other specified postprocedural states: Secondary | ICD-10-CM | POA: Diagnosis not present

## 2015-03-22 DIAGNOSIS — Z4682 Encounter for fitting and adjustment of non-vascular catheter: Secondary | ICD-10-CM | POA: Diagnosis not present

## 2015-03-22 DIAGNOSIS — K30 Functional dyspepsia: Secondary | ICD-10-CM | POA: Diagnosis present

## 2015-03-22 DIAGNOSIS — I129 Hypertensive chronic kidney disease with stage 1 through stage 4 chronic kidney disease, or unspecified chronic kidney disease: Secondary | ICD-10-CM | POA: Diagnosis present

## 2015-03-22 DIAGNOSIS — Z955 Presence of coronary angioplasty implant and graft: Secondary | ICD-10-CM

## 2015-03-22 DIAGNOSIS — J9811 Atelectasis: Secondary | ICD-10-CM | POA: Diagnosis not present

## 2015-03-22 DIAGNOSIS — D649 Anemia, unspecified: Secondary | ICD-10-CM | POA: Diagnosis not present

## 2015-03-22 DIAGNOSIS — Z8673 Personal history of transient ischemic attack (TIA), and cerebral infarction without residual deficits: Secondary | ICD-10-CM

## 2015-03-22 DIAGNOSIS — Z87891 Personal history of nicotine dependence: Secondary | ICD-10-CM | POA: Diagnosis not present

## 2015-03-22 DIAGNOSIS — K219 Gastro-esophageal reflux disease without esophagitis: Secondary | ICD-10-CM | POA: Diagnosis present

## 2015-03-22 DIAGNOSIS — Z452 Encounter for adjustment and management of vascular access device: Secondary | ICD-10-CM | POA: Diagnosis not present

## 2015-03-22 DIAGNOSIS — R911 Solitary pulmonary nodule: Secondary | ICD-10-CM | POA: Diagnosis not present

## 2015-03-22 DIAGNOSIS — R001 Bradycardia, unspecified: Secondary | ICD-10-CM | POA: Diagnosis not present

## 2015-03-22 DIAGNOSIS — I739 Peripheral vascular disease, unspecified: Secondary | ICD-10-CM | POA: Diagnosis not present

## 2015-03-22 DIAGNOSIS — Z902 Acquired absence of lung [part of]: Secondary | ICD-10-CM

## 2015-03-22 DIAGNOSIS — J984 Other disorders of lung: Secondary | ICD-10-CM | POA: Diagnosis not present

## 2015-03-22 HISTORY — PX: LYMPH NODE DISSECTION: SHX5087

## 2015-03-22 HISTORY — PX: VIDEO ASSISTED THORACOSCOPY: SHX5073

## 2015-03-22 HISTORY — PX: SEGMENTECOMY: SHX5076

## 2015-03-22 SURGERY — VIDEO ASSISTED THORACOSCOPY
Anesthesia: General | Site: Chest | Laterality: Right

## 2015-03-22 MED ORDER — LACTATED RINGERS IV SOLN
INTRAVENOUS | Status: DC | PRN
  Administered 2015-03-22: 06:00:00 via INTRAVENOUS

## 2015-03-22 MED ORDER — DEXTROSE 5 % IV SOLN
1.5000 g | Freq: Two times a day (BID) | INTRAVENOUS | Status: AC
  Administered 2015-03-22 – 2015-03-23 (×2): 1.5 g via INTRAVENOUS
  Filled 2015-03-22 (×2): qty 1.5

## 2015-03-22 MED ORDER — HEMOSTATIC AGENTS (NO CHARGE) OPTIME
TOPICAL | Status: DC | PRN
  Administered 2015-03-22: 1 via TOPICAL

## 2015-03-22 MED ORDER — BISACODYL 5 MG PO TBEC
10.0000 mg | DELAYED_RELEASE_TABLET | Freq: Every day | ORAL | Status: DC
  Administered 2015-03-23 – 2015-03-25 (×3): 10 mg via ORAL
  Filled 2015-03-22 (×3): qty 2

## 2015-03-22 MED ORDER — LIDOCAINE HCL (CARDIAC) 20 MG/ML IV SOLN
INTRAVENOUS | Status: AC
  Filled 2015-03-22: qty 5

## 2015-03-22 MED ORDER — VECURONIUM BROMIDE 10 MG IV SOLR
INTRAVENOUS | Status: DC | PRN
  Administered 2015-03-22: 1 mg via INTRAVENOUS

## 2015-03-22 MED ORDER — ESMOLOL HCL 10 MG/ML IV SOLN
INTRAVENOUS | Status: AC
  Filled 2015-03-22: qty 10

## 2015-03-22 MED ORDER — HYDRALAZINE HCL 20 MG/ML IJ SOLN
10.0000 mg | INTRAMUSCULAR | Status: DC | PRN
  Administered 2015-03-22: 10 mg via INTRAVENOUS
  Filled 2015-03-22: qty 1

## 2015-03-22 MED ORDER — NEOSTIGMINE METHYLSULFATE 10 MG/10ML IV SOLN
INTRAVENOUS | Status: AC
  Filled 2015-03-22: qty 2

## 2015-03-22 MED ORDER — ROCURONIUM BROMIDE 50 MG/5ML IV SOLN
INTRAVENOUS | Status: AC
  Filled 2015-03-22: qty 1

## 2015-03-22 MED ORDER — PROPOFOL 10 MG/ML IV BOLUS
INTRAVENOUS | Status: AC
  Filled 2015-03-22: qty 20

## 2015-03-22 MED ORDER — VECURONIUM BROMIDE 10 MG IV SOLR
INTRAVENOUS | Status: AC
  Filled 2015-03-22: qty 10

## 2015-03-22 MED ORDER — LACTATED RINGERS IV SOLN
INTRAVENOUS | Status: DC | PRN
  Administered 2015-03-22: 07:00:00 via INTRAVENOUS

## 2015-03-22 MED ORDER — BUPIVACAINE HCL (PF) 0.5 % IJ SOLN
INTRAMUSCULAR | Status: DC | PRN
  Administered 2015-03-22: 10 mL

## 2015-03-22 MED ORDER — DEXTROSE-NACL 5-0.9 % IV SOLN
INTRAVENOUS | Status: DC
  Administered 2015-03-22 – 2015-03-23 (×3): via INTRAVENOUS

## 2015-03-22 MED ORDER — SODIUM CHLORIDE 0.9 % IJ SOLN: 9.0000 mL | INTRAMUSCULAR | Status: DC | PRN

## 2015-03-22 MED ORDER — DEXTROSE 5 % IV SOLN
10.0000 mg | INTRAVENOUS | Status: DC | PRN
  Administered 2015-03-22: 20 ug/min via INTRAVENOUS

## 2015-03-22 MED ORDER — 0.9 % SODIUM CHLORIDE (POUR BTL) OPTIME
TOPICAL | Status: DC | PRN
  Administered 2015-03-22: 2000 mL

## 2015-03-22 MED ORDER — BUPIVACAINE 0.5 % ON-Q PUMP SINGLE CATH 400 ML
INJECTION | Status: DC | PRN
  Administered 2015-03-22: 400 mL

## 2015-03-22 MED ORDER — FENTANYL CITRATE (PF) 100 MCG/2ML IJ SOLN
25.0000 ug | INTRAMUSCULAR | Status: DC | PRN
  Administered 2015-03-22: 50 ug via INTRAVENOUS

## 2015-03-22 MED ORDER — GLYCOPYRROLATE 0.2 MG/ML IJ SOLN
INTRAMUSCULAR | Status: DC | PRN
  Administered 2015-03-22: .6 mg via INTRAVENOUS

## 2015-03-22 MED ORDER — ESMOLOL HCL 10 MG/ML IV SOLN
INTRAVENOUS | Status: DC | PRN
  Administered 2015-03-22: 30 mg via INTRAVENOUS

## 2015-03-22 MED ORDER — BUPIVACAINE 0.5 % ON-Q PUMP SINGLE CATH 400 ML
400.0000 mL | INJECTION | Status: DC
  Filled 2015-03-22: qty 400

## 2015-03-22 MED ORDER — CITALOPRAM HYDROBROMIDE 40 MG PO TABS
40.0000 mg | ORAL_TABLET | Freq: Every day | ORAL | Status: DC
  Administered 2015-03-23 – 2015-03-27 (×5): 40 mg via ORAL
  Filled 2015-03-22 (×5): qty 1

## 2015-03-22 MED ORDER — ISOSORBIDE MONONITRATE ER 30 MG PO TB24
30.0000 mg | ORAL_TABLET | Freq: Every day | ORAL | Status: DC
  Administered 2015-03-23 – 2015-03-27 (×5): 30 mg via ORAL
  Filled 2015-03-22 (×5): qty 1

## 2015-03-22 MED ORDER — OXYCODONE HCL 5 MG/5ML PO SOLN: 5.0000 mg | Freq: Once | ORAL | Status: DC | PRN

## 2015-03-22 MED ORDER — NEOSTIGMINE METHYLSULFATE 10 MG/10ML IV SOLN
INTRAVENOUS | Status: DC | PRN
  Administered 2015-03-22: 3 mg via INTRAVENOUS

## 2015-03-22 MED ORDER — CETYLPYRIDINIUM CHLORIDE 0.05 % MT LIQD
7.0000 mL | Freq: Two times a day (BID) | OROMUCOSAL | Status: DC
  Administered 2015-03-22 – 2015-03-26 (×6): 7 mL via OROMUCOSAL

## 2015-03-22 MED ORDER — PROPOFOL 10 MG/ML IV BOLUS
INTRAVENOUS | Status: DC | PRN
  Administered 2015-03-22: 80 mg via INTRAVENOUS

## 2015-03-22 MED ORDER — FENTANYL CITRATE (PF) 250 MCG/5ML IJ SOLN
INTRAMUSCULAR | Status: AC
  Filled 2015-03-22: qty 5

## 2015-03-22 MED ORDER — ROCURONIUM BROMIDE 100 MG/10ML IV SOLN
INTRAVENOUS | Status: DC | PRN
  Administered 2015-03-22: 50 mg via INTRAVENOUS

## 2015-03-22 MED ORDER — SENNOSIDES-DOCUSATE SODIUM 8.6-50 MG PO TABS
1.0000 | ORAL_TABLET | Freq: Every day | ORAL | Status: DC
  Administered 2015-03-22 – 2015-03-26 (×5): 1 via ORAL
  Filled 2015-03-22 (×6): qty 1

## 2015-03-22 MED ORDER — MIDAZOLAM HCL 2 MG/2ML IJ SOLN
INTRAMUSCULAR | Status: AC
  Filled 2015-03-22: qty 4

## 2015-03-22 MED ORDER — ONDANSETRON HCL 4 MG/2ML IJ SOLN
INTRAMUSCULAR | Status: AC
  Filled 2015-03-22: qty 2

## 2015-03-22 MED ORDER — PHENYLEPHRINE 40 MCG/ML (10ML) SYRINGE FOR IV PUSH (FOR BLOOD PRESSURE SUPPORT)
PREFILLED_SYRINGE | INTRAVENOUS | Status: AC
  Filled 2015-03-22: qty 10

## 2015-03-22 MED ORDER — ONDANSETRON HCL 4 MG/2ML IJ SOLN: 4.0000 mg | Freq: Four times a day (QID) | INTRAMUSCULAR | Status: DC | PRN

## 2015-03-22 MED ORDER — FENTANYL CITRATE (PF) 100 MCG/2ML IJ SOLN
INTRAMUSCULAR | Status: DC | PRN
  Administered 2015-03-22: 50 ug via INTRAVENOUS
  Administered 2015-03-22: 250 ug via INTRAVENOUS

## 2015-03-22 MED ORDER — POTASSIUM CHLORIDE 10 MEQ/50ML IV SOLN
10.0000 meq | Freq: Every day | INTRAVENOUS | Status: DC | PRN
  Filled 2015-03-22: qty 50

## 2015-03-22 MED ORDER — OXYCODONE HCL 5 MG PO TABS: 5.0000 mg | ORAL_TABLET | Freq: Once | ORAL | Status: DC | PRN

## 2015-03-22 MED ORDER — NALOXONE HCL 0.4 MG/ML IJ SOLN
0.4000 mg | INTRAMUSCULAR | Status: DC | PRN
  Filled 2015-03-22: qty 1

## 2015-03-22 MED ORDER — FENTANYL 10 MCG/ML IV SOLN
INTRAVENOUS | Status: AC
  Administered 2015-03-22: 12:00:00 via INTRAVENOUS
  Administered 2015-03-22: 60 ug via INTRAVENOUS
  Administered 2015-03-22: 80 ug via INTRAVENOUS
  Administered 2015-03-23: 30 ug via INTRAVENOUS
  Administered 2015-03-23 (×2): 10 ug via INTRAVENOUS
  Administered 2015-03-23 (×2): 60 ug via INTRAVENOUS
  Administered 2015-03-23: 20 ug via INTRAVENOUS
  Administered 2015-03-24: 40 ug via INTRAVENOUS
  Administered 2015-03-24 (×2): 20 ug via INTRAVENOUS
  Filled 2015-03-22: qty 50

## 2015-03-22 MED ORDER — GLYCOPYRROLATE 0.2 MG/ML IJ SOLN
INTRAMUSCULAR | Status: AC
  Filled 2015-03-22: qty 2

## 2015-03-22 MED ORDER — CLONIDINE HCL 0.1 MG PO TABS
0.1000 mg | ORAL_TABLET | Freq: Every day | ORAL | Status: DC
  Administered 2015-03-23 – 2015-03-27 (×5): 0.1 mg via ORAL
  Filled 2015-03-22 (×5): qty 1

## 2015-03-22 MED ORDER — LORATADINE 10 MG PO TABS
10.0000 mg | ORAL_TABLET | Freq: Every day | ORAL | Status: DC
  Administered 2015-03-23 – 2015-03-27 (×5): 10 mg via ORAL
  Filled 2015-03-22 (×5): qty 1

## 2015-03-22 MED ORDER — SUCCINYLCHOLINE CHLORIDE 20 MG/ML IJ SOLN
INTRAMUSCULAR | Status: AC
  Filled 2015-03-22: qty 1

## 2015-03-22 MED ORDER — ASPIRIN EC 81 MG PO TBEC
81.0000 mg | DELAYED_RELEASE_TABLET | Freq: Every day | ORAL | Status: DC
  Administered 2015-03-23 – 2015-03-27 (×5): 81 mg via ORAL
  Filled 2015-03-22 (×5): qty 1

## 2015-03-22 MED ORDER — BUPIVACAINE HCL (PF) 0.5 % IJ SOLN
INTRAMUSCULAR | Status: AC
  Filled 2015-03-22: qty 10

## 2015-03-22 MED ORDER — PANTOPRAZOLE SODIUM 40 MG PO TBEC
40.0000 mg | DELAYED_RELEASE_TABLET | Freq: Every day | ORAL | Status: DC
Start: 2015-03-23 — End: 2015-03-27
  Administered 2015-03-23 – 2015-03-26 (×4): 40 mg via ORAL
  Filled 2015-03-22 (×3): qty 1

## 2015-03-22 MED ORDER — LEVALBUTEROL HCL 0.63 MG/3ML IN NEBU
0.6300 mg | INHALATION_SOLUTION | Freq: Four times a day (QID) | RESPIRATORY_TRACT | Status: DC
  Administered 2015-03-22 – 2015-03-23 (×6): 0.63 mg via RESPIRATORY_TRACT
  Filled 2015-03-22 (×14): qty 3

## 2015-03-22 MED ORDER — CLONIDINE HCL 0.1 MG PO TABS: 0.1000 mg | ORAL_TABLET | Freq: Two times a day (BID) | ORAL | Status: DC

## 2015-03-22 MED ORDER — TRAMADOL HCL 50 MG PO TABS
50.0000 mg | ORAL_TABLET | Freq: Four times a day (QID) | ORAL | Status: DC | PRN
  Administered 2015-03-24 – 2015-03-25 (×2): 100 mg via ORAL
  Administered 2015-03-26: 50 mg via ORAL
  Filled 2015-03-22: qty 2
  Filled 2015-03-22: qty 1
  Filled 2015-03-22: qty 2
  Filled 2015-03-22: qty 1

## 2015-03-22 MED ORDER — CLONIDINE HCL 0.2 MG PO TABS: 0.2000 mg | ORAL_TABLET | Freq: Every day | ORAL | Status: DC

## 2015-03-22 MED ORDER — EPHEDRINE SULFATE 50 MG/ML IJ SOLN
INTRAMUSCULAR | Status: AC
  Filled 2015-03-22: qty 1

## 2015-03-22 MED ORDER — OXYCODONE HCL 5 MG PO TABS
5.0000 mg | ORAL_TABLET | ORAL | Status: DC | PRN
  Administered 2015-03-27: 5 mg via ORAL
  Filled 2015-03-22: qty 1

## 2015-03-22 MED ORDER — PRAMIPEXOLE DIHYDROCHLORIDE 1 MG PO TABS
1.0000 mg | ORAL_TABLET | Freq: Three times a day (TID) | ORAL | Status: DC
  Administered 2015-03-23 – 2015-03-27 (×12): 1 mg via ORAL
  Filled 2015-03-22 (×17): qty 1

## 2015-03-22 MED ORDER — CLONIDINE HCL 0.2 MG PO TABS
0.2000 mg | ORAL_TABLET | Freq: Every day | ORAL | Status: DC
  Administered 2015-03-22 – 2015-03-26 (×5): 0.2 mg via ORAL
  Filled 2015-03-22 (×6): qty 1

## 2015-03-22 MED ORDER — FENTANYL CITRATE (PF) 100 MCG/2ML IJ SOLN
INTRAMUSCULAR | Status: AC
  Administered 2015-03-22: 50 ug via INTRAVENOUS
  Filled 2015-03-22: qty 2

## 2015-03-22 MED ORDER — CLOPIDOGREL BISULFATE 75 MG PO TABS
75.0000 mg | ORAL_TABLET | Freq: Every day | ORAL | Status: DC
  Administered 2015-03-23 – 2015-03-27 (×5): 75 mg via ORAL
  Filled 2015-03-22 (×5): qty 1

## 2015-03-22 MED ORDER — MIDAZOLAM HCL 5 MG/5ML IJ SOLN
INTRAMUSCULAR | Status: DC | PRN
  Administered 2015-03-22: 1 mg via INTRAVENOUS

## 2015-03-22 MED ORDER — AMLODIPINE BESYLATE 10 MG PO TABS
10.0000 mg | ORAL_TABLET | Freq: Every day | ORAL | Status: DC
  Administered 2015-03-23 – 2015-03-27 (×5): 10 mg via ORAL
  Filled 2015-03-22 (×5): qty 1

## 2015-03-22 MED ORDER — CHLORHEXIDINE GLUCONATE 0.12 % MT SOLN
15.0000 mL | Freq: Two times a day (BID) | OROMUCOSAL | Status: DC
  Administered 2015-03-22 – 2015-03-23 (×2): 15 mL via OROMUCOSAL
  Filled 2015-03-22: qty 15

## 2015-03-22 MED ORDER — ONDANSETRON HCL 4 MG/2ML IJ SOLN
INTRAMUSCULAR | Status: DC | PRN
  Administered 2015-03-22: 4 mg via INTRAVENOUS

## 2015-03-22 SURGICAL SUPPLY — 96 items
ADH SKN CLS APL DERMABOND .7 (GAUZE/BANDAGES/DRESSINGS) ×1
APL SKNCLS STERI-STRIP NONHPOA (GAUZE/BANDAGES/DRESSINGS) ×1
APPLIER CLIP ROT 10 11.4 M/L (STAPLE)
APR CLP MED LRG 11.4X10 (STAPLE)
BAG SPEC RTRVL LRG 6X4 10 (ENDOMECHANICALS) ×1
BENZOIN TINCTURE PRP APPL 2/3 (GAUZE/BANDAGES/DRESSINGS) ×2 IMPLANT
CANISTER SUCTION 2500CC (MISCELLANEOUS) ×3 IMPLANT
CATH KIT ON Q 5IN SLV (PAIN MANAGEMENT) ×2 IMPLANT
CATH THORACIC 28FR (CATHETERS) ×2 IMPLANT
CATH THORACIC 28FR RT ANG (CATHETERS) IMPLANT
CATH THORACIC 36FR (CATHETERS) IMPLANT
CATH THORACIC 36FR RT ANG (CATHETERS) IMPLANT
CLIP APPLIE ROT 10 11.4 M/L (STAPLE) IMPLANT
CLIP TI MEDIUM 6 (CLIP) ×2 IMPLANT
CLOSURE STERI-STRIP 1/2X4 (GAUZE/BANDAGES/DRESSINGS) ×1
CLSR STERI-STRIP ANTIMIC 1/2X4 (GAUZE/BANDAGES/DRESSINGS) ×1 IMPLANT
CONN Y 3/8X3/8X3/8  BEN (MISCELLANEOUS) ×2
CONN Y 3/8X3/8X3/8 BEN (MISCELLANEOUS) ×1 IMPLANT
CONT SPEC 4OZ CLIKSEAL STRL BL (MISCELLANEOUS) ×16 IMPLANT
COVER SURGICAL LIGHT HANDLE (MISCELLANEOUS) ×3 IMPLANT
CUTTER ECHEON FLEX ENDO 45 340 (ENDOMECHANICALS) ×2 IMPLANT
DERMABOND ADVANCED (GAUZE/BANDAGES/DRESSINGS) ×2
DERMABOND ADVANCED .7 DNX12 (GAUZE/BANDAGES/DRESSINGS) IMPLANT
DRAIN CHANNEL 28F RND 3/8 FF (WOUND CARE) IMPLANT
DRAIN CHANNEL 32F RND 10.7 FF (WOUND CARE) IMPLANT
DRAPE LAPAROSCOPIC ABDOMINAL (DRAPES) ×3 IMPLANT
DRAPE WARM FLUID 44X44 (DRAPE) ×3 IMPLANT
DRSG TEGADERM 4X4.75 (GAUZE/BANDAGES/DRESSINGS) ×2 IMPLANT
ELECT BLADE 6.5 EXT (BLADE) ×4 IMPLANT
ELECT REM PT RETURN 9FT ADLT (ELECTROSURGICAL) ×3
ELECTRODE REM PT RTRN 9FT ADLT (ELECTROSURGICAL) ×1 IMPLANT
GAUZE SPONGE 4X4 12PLY STRL (GAUZE/BANDAGES/DRESSINGS) ×3 IMPLANT
GLOVE BIO SURGEON STRL SZ7.5 (GLOVE) ×2 IMPLANT
GLOVE BIOGEL PI IND STRL 6.5 (GLOVE) IMPLANT
GLOVE BIOGEL PI INDICATOR 6.5 (GLOVE) ×4
GLOVE SURG SIGNA 7.5 PF LTX (GLOVE) ×6 IMPLANT
GLOVE SURG SS PI 7.0 STRL IVOR (GLOVE) ×2 IMPLANT
GOWN STRL REUS W/ TWL LRG LVL3 (GOWN DISPOSABLE) ×2 IMPLANT
GOWN STRL REUS W/ TWL XL LVL3 (GOWN DISPOSABLE) ×1 IMPLANT
GOWN STRL REUS W/TWL LRG LVL3 (GOWN DISPOSABLE) ×6
GOWN STRL REUS W/TWL XL LVL3 (GOWN DISPOSABLE) ×6
HEMOSTAT SURGICEL 2X14 (HEMOSTASIS) ×2 IMPLANT
KIT BASIN OR (CUSTOM PROCEDURE TRAY) ×3 IMPLANT
KIT ROOM TURNOVER OR (KITS) ×3 IMPLANT
KIT SUCTION CATH 14FR (SUCTIONS) ×3 IMPLANT
NS IRRIG 1000ML POUR BTL (IV SOLUTION) ×6 IMPLANT
PACK CHEST (CUSTOM PROCEDURE TRAY) ×3 IMPLANT
PAD ARMBOARD 7.5X6 YLW CONV (MISCELLANEOUS) ×6 IMPLANT
PENCIL BUTTON HOLSTER BLD 10FT (ELECTRODE) ×2 IMPLANT
POUCH ENDO CATCH II 15MM (MISCELLANEOUS) IMPLANT
POUCH SPECIMEN RETRIEVAL 10MM (ENDOMECHANICALS) ×2 IMPLANT
RELOAD GOLD ECHELON 45 (STAPLE) ×14 IMPLANT
RELOAD GREEN ECHELON 45 (STAPLE) ×6 IMPLANT
RELOAD STAPLE 35X2.5 WHT THIN (STAPLE) IMPLANT
SEALANT PROGEL (MISCELLANEOUS) ×2 IMPLANT
SEALANT SURG COSEAL 4ML (VASCULAR PRODUCTS) IMPLANT
SEALANT SURG COSEAL 8ML (VASCULAR PRODUCTS) IMPLANT
SOLUTION ANTI FOG 6CC (MISCELLANEOUS) ×3 IMPLANT
SPECIMEN JAR MEDIUM (MISCELLANEOUS) ×3 IMPLANT
SPONGE GAUZE 4X4 12PLY STER LF (GAUZE/BANDAGES/DRESSINGS) ×2 IMPLANT
SPONGE INTESTINAL PEANUT (DISPOSABLE) ×4 IMPLANT
SPONGE TONSIL 1 RF SGL (DISPOSABLE) ×2 IMPLANT
STAPLE RELOAD 2.5MM WHITE (STAPLE) ×3 IMPLANT
STAPLER VASCULAR ECHELON 35 (CUTTER) ×2 IMPLANT
SUT PROLENE 4 0 RB 1 (SUTURE) ×3
SUT PROLENE 4-0 RB1 .5 CRCL 36 (SUTURE) IMPLANT
SUT SILK  1 MH (SUTURE) ×4
SUT SILK 1 MH (SUTURE) ×2 IMPLANT
SUT SILK 1 TIES 10X30 (SUTURE) ×2 IMPLANT
SUT SILK 2 0 SH (SUTURE) ×2 IMPLANT
SUT SILK 2 0SH CR/8 30 (SUTURE) IMPLANT
SUT SILK 3 0SH CR/8 30 (SUTURE) IMPLANT
SUT VIC AB 1 CTX 36 (SUTURE) ×3
SUT VIC AB 1 CTX36XBRD ANBCTR (SUTURE) IMPLANT
SUT VIC AB 2-0 CTX 36 (SUTURE) ×2 IMPLANT
SUT VIC AB 2-0 UR6 27 (SUTURE) IMPLANT
SUT VIC AB 3-0 MH 27 (SUTURE) IMPLANT
SUT VIC AB 3-0 SH 27 (SUTURE) ×3
SUT VIC AB 3-0 SH 27X BRD (SUTURE) IMPLANT
SUT VIC AB 3-0 X1 27 (SUTURE) ×3 IMPLANT
SUT VICRYL 2 TP 1 (SUTURE) IMPLANT
SWAB COLLECTION DEVICE MRSA (MISCELLANEOUS) IMPLANT
SYSTEM SAHARA CHEST DRAIN ATS (WOUND CARE) ×3 IMPLANT
TAPE CLOTH 4X10 WHT NS (GAUZE/BANDAGES/DRESSINGS) ×3 IMPLANT
TAPE CLOTH SURG 4X10 WHT LF (GAUZE/BANDAGES/DRESSINGS) ×2 IMPLANT
TIP APPLICATOR SPRAY EXTEND 16 (VASCULAR PRODUCTS) ×2 IMPLANT
TOWEL OR 17X24 6PK STRL BLUE (TOWEL DISPOSABLE) ×3 IMPLANT
TOWEL OR 17X26 10 PK STRL BLUE (TOWEL DISPOSABLE) ×6 IMPLANT
TRAP SPECIMEN MUCOUS 40CC (MISCELLANEOUS) IMPLANT
TRAY FOLEY BAG SILVER LF 16FR (SET/KITS/TRAYS/PACK) ×2 IMPLANT
TRAY FOLEY CATH 16FRSI W/METER (SET/KITS/TRAYS/PACK) ×1 IMPLANT
TROCAR BLADELESS 5M (ENDOMECHANICALS) ×2 IMPLANT
TROCAR XCEL NON-BLD 5MMX100MML (ENDOMECHANICALS) IMPLANT
TUBE ANAEROBIC SPECIMEN COL (MISCELLANEOUS) IMPLANT
TUNNELER SHEATH ON-Q 11GX8 DSP (PAIN MANAGEMENT) ×2 IMPLANT
WATER STERILE IRR 1000ML POUR (IV SOLUTION) ×6 IMPLANT

## 2015-03-22 NOTE — Interval H&P Note (Signed)
History and Physical Interval Note:  03/22/2015 7:20 AM  Victor Davis  has presented today for surgery, with the diagnosis of RLL NODULE  The various methods of treatment have been discussed with the patient and family. After consideration of risks, benefits and other options for treatment, the patient has consented to  Procedure(s): VIDEO ASSISTED THORACOSCOPY (Right) RIGHT LOWER LOBE SEGMENTECTOMY (Right) as a surgical intervention .  The patient's history has been reviewed, patient examined, no change in status, stable for surgery.  I have reviewed the patient's chart and labs.  Questions were answered to the patient's satisfaction.     Melrose Nakayama

## 2015-03-22 NOTE — Anesthesia Procedure Notes (Addendum)
Anesthesia Procedure Note Central line insertion note. Skin prepped and draped in sterile fashion. Patent vessel identified on u/s using linear probe. Needle advanced under live u/s guidance with aspiration of blood upon entry into vessel. Catheter passed easily over finder needle. Wire passed easily through catheter and location confirmed with u/s. Image saved in chart. Introducer catheter advanced over wire, with aspiration of blood through all ports for confirmation. Line sutured and dressing applied. Pt tolerated well with no immediate complications.  Ola Spurr, MD Procedure Name: Intubation Date/Time: 03/22/2015 7:48 AM Performed by: Garrison Columbus T Pre-anesthesia Checklist: Patient identified, Emergency Drugs available, Suction available and Patient being monitored Patient Re-evaluated:Patient Re-evaluated prior to inductionOxygen Delivery Method: Circle system utilized Preoxygenation: Pre-oxygenation with 100% oxygen Intubation Type: IV induction Ventilation: Mask ventilation without difficulty Laryngoscope Size: Mac and 3 Grade View: Grade I Tube type: Oral Endobronchial tube: Left, Double lumen EBT, EBT position confirmed by auscultation and EBT position confirmed by fiberoptic bronchoscope and 39 Fr Number of attempts: 1 Airway Equipment and Method: Stylet and Fiberoptic brochoscope Placement Confirmation: ETT inserted through vocal cords under direct vision,  positive ETCO2,  CO2 detector and breath sounds checked- equal and bilateral Tube secured with: Tape Dental Injury: Teeth and Oropharynx as per pre-operative assessment  Comments: Intubation by Lavona Mound, SRNA   Anesthesia Procedure Note Central line insertion note. Skin prepped and draped in sterile fashion. Patent vessel identified on u/s using linear probe. Needle advanced under live u/s guidance with aspiration of blood upon entry into vessel. Catheter passed easily over finder needle. Wire passed easily through catheter  and location confirmed with u/s. Image saved in chart. Introducer catheter advanced over wire, with aspiration of blood through all ports for confirmation. Line sutured and dressing applied. Pt tolerated well with no immediate complications.  Ola Spurr, MD

## 2015-03-22 NOTE — Progress Notes (Signed)
Patient ID: Victor Davis, male   DOB: [birth year], 74 y.o.   MRN: [id number] EVENING ROUNDS NOTE :     [address]       [place] [address]             [phone]                 Day of Surgery Procedure(s) (LRB): RIGHT VIDEO ASSISTED THORACOSCOPY (Right) RIGHT LOWER LOBE SUPERIOR SEGMENTECTOMY (Right) LYMPH NODE DISSECTION (Right)  Total Length of Stay:  LOS: 0 days  BP 138/72 mmHg  Pulse 82  Temp(Src) 98.4 F (36.9 C) (Oral)  Resp 15  Ht '5\' 8"'$  (1.727 m)  Wt 149 lb (67.586 kg)  BMI 22.66 kg/m2  SpO2 98%  .Intake/Output      08/08 0701 - 08/09 0700   I.V. (mL/kg) 1775 (26.3)   Total Intake(mL/kg) 1775 (26.3)   Urine (mL/kg/hr) 695 (0.8)   Blood 200 (0.2)   Chest Tube 104 (0.1)   Total Output 999   Net +776         . dextrose 5 % and 0.9% NaCl 100 mL/hr (03/22/15 1705)     Lab Results  Component Value Date   WBC 6.5 03/19/2015   HGB 11.6* 03/19/2015   HCT 35.1* 03/19/2015   PLT 132* 03/19/2015   GLUCOSE 103* 03/19/2015   CHOL 133 10/20/2014   TRIG 77.0 10/20/2014   HDL 35.80* 10/20/2014   LDLDIRECT 104.9 02/10/2011   LDLCALC 82 10/20/2014   ALT 11* 03/19/2015   AST 17 03/19/2015   NA 142 03/19/2015   K 4.3 03/19/2015   CL 112* 03/19/2015   CREATININE 1.54* 03/19/2015   BUN 20 03/19/2015   CO2 23 03/19/2015   TSH 3.79 10/20/2014   PSA 6.39* 10/20/2014   INR 1.03 03/19/2015   HGBA1C 5.8 04/21/2014   MICROALBUR 3.6* 02/01/2010   Stable post op bp elevated resume clonidine   Grace Isaac MD  Beeper [phone] Office [phone] 03/22/2015 7:39 PM

## 2015-03-22 NOTE — Anesthesia Postprocedure Evaluation (Signed)
  Anesthesia Post-op Note  Patient: Victor Davis  Procedure(s) Performed: Procedure(s): RIGHT VIDEO ASSISTED THORACOSCOPY (Right) RIGHT LOWER LOBE SUPERIOR SEGMENTECTOMY (Right) LYMPH NODE DISSECTION (Right)  Patient Location: PACU  Anesthesia Type: General   Level of Consciousness: awake, alert  and oriented  Airway and Oxygen Therapy: Patient Spontanous Breathing  Post-op Pain: mild  Post-op Assessment: Post-op Vital signs reviewed  Post-op Vital Signs: Reviewed  Last Vitals:  Filed Vitals:   03/22/15 1900  BP: 138/72  Pulse: 82  Temp:   Resp: 15    Complications: No apparent anesthesia complications

## 2015-03-22 NOTE — Transfer of Care (Signed)
Immediate Anesthesia Transfer of Care Note  Patient: Victor Davis  Procedure(s) Performed: Procedure(s): RIGHT VIDEO ASSISTED THORACOSCOPY (Right) RIGHT LOWER LOBE SUPERIOR SEGMENTECTOMY (Right) LYMPH NODE DISSECTION (Right)  Patient Location: PACU  Anesthesia Type:General  Level of Consciousness: awake, alert , patient cooperative and responds to stimulation  Airway & Oxygen Therapy: Patient Spontanous Breathing and Patient connected to face mask oxygen  Post-op Assessment: Report given to RN and Post -op Vital signs reviewed and stable  Post vital signs: Reviewed and stable  Last Vitals:  Filed Vitals:   03/22/15 0548  BP: 128/61  Pulse: 50  Temp: 36.6 C  Resp: 20    Complications: No apparent anesthesia complications

## 2015-03-22 NOTE — Brief Op Note (Addendum)
03/22/2015      [address]       [place] [address]             [phone]     03/22/2015  11:07 AM  PATIENT:  Victor Davis  74 y.o. male  PRE-OPERATIVE DIAGNOSIS:  RIGHT LOWER LOBE LUNG  NODULE  POST-OPERATIVE DIAGNOSIS:  RIGHT LOWER LOBE LUNG  NODULE  PROCEDURE:  Procedure(s): RIGHT VIDEO ASSISTED THORACOSCOPY THORACOSCOPIC RIGHT LOWER LOBE SUPERIOR SEGMENTECTOMY LYMPH NODE SAMPLING On-Q LOCAL ANESTHETIC CATHETER PLACEMENT  SURGEON:  Surgeon(s): Melrose Nakayama, MD  PHYSICIAN ASSISTANT: WAYNE GOLD PA-C  ANESTHESIA:   general  SPECIMEN:  Source of Specimen:  RLL SUPERIOR SEGMENTECTOMY , LN SAMPLES  DISPOSITION OF SPECIMEN:  Pathology  DRAINS: 1 Chest Tube(s) in the RIGHT HEMITHORAX   PATIENT CONDITION:  PACU - hemodynamically stable.  PRE-OPERATIVE WEIGHT: 67kg  EBL: 250 CC  FROZEN: APPEARS INFLAMMATORY, CANNOT R/O MALIGNANCY TILL PERMANENT SECTIONS COMPLETE  COMPLICATIONS: NO KNOWN

## 2015-03-22 NOTE — Anesthesia Preprocedure Evaluation (Addendum)
Anesthesia Evaluation  Patient identified by MRN, date of birth, ID band Patient awake    Reviewed: Allergy & Precautions, NPO status , Patient's Chart, lab work & pertinent test results  History of Anesthesia Complications (+) AWARENESS UNDER ANESTHESIANegative for: history of anesthetic complications  Airway Mallampati: II  TM Distance: >3 FB Neck ROM: Full    Dental  (+) Dental Advisory Given, Missing, Poor Dentition,    Pulmonary shortness of breath and with exertion, COPDformer smoker,  breath sounds clear to auscultation        Cardiovascular hypertension, Pt. on medications + CAD, + Cardiac Stents and + Peripheral Vascular Disease Rhythm:Regular Rate:Bradycardia     Neuro/Psych CVA negative psych ROS   GI/Hepatic PUD, GERD-  Medicated,  Endo/Other    Renal/GU CRFRenal disease     Musculoskeletal  (+) Arthritis -,   Abdominal   Peds  Hematology   Anesthesia Other Findings   Reproductive/Obstetrics                          Anesthesia Physical Anesthesia Plan  ASA: III  Anesthesia Plan: General   Post-op Pain Management:    Induction: Intravenous  Airway Management Planned: Double Lumen EBT  Additional Equipment: Arterial line, CVP and Ultrasound Guidance Line Placement  Intra-op Plan:   Post-operative Plan: Extubation in OR and Possible Post-op intubation/ventilation  Informed Consent: I have reviewed the patients History and Physical, chart, labs and discussed the procedure including the risks, benefits and alternatives for the proposed anesthesia with the patient or authorized representative who has indicated his/her understanding and acceptance.   Dental advisory given  Plan Discussed with: CRNA, Anesthesiologist and Surgeon  Anesthesia Plan Comments:         Anesthesia Quick Evaluation

## 2015-03-22 NOTE — H&P (View-Only) (Signed)
[address]       [place] [address]             [phone]       HPI: Batiz returns for a scheduled 3 month follow-up visit regarding his groundglass opacity in the right lower lobe.  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 He has a history of tobacco abuse, smoking 2 packs of cigarettes daily for about 15 years prior to quitting in 1978. His past medical history is extensive and includes a cerebrovascular accident, coronary artery disease with stenting by Dr. Angelena Form, bradycardia, prostate cancer, arthritis, hypertension, hyperlipidemia, reflux, and emphysema. He saw Dr. Jenny Reichmann back in 2013 with a complaint of cough and shortness of breath. Chest x-ray showed a possible nodule. A CT of the chest was done and showed an 8 mm groundglass opacity in the superior segment of the right lower lobe. A year later (August 2014) the nodule measured 9 mm. On repeat CT in March 2016 the nodule had increased in size from 9 to 12 mm. There were no other suspicious findings.  I saw him around that time recommended surgical resection. His wife had just been diagnosed with breast cancer and he did not want to have surgery. He asked to delay surgery until now. He returns with a repeat CT scan.  Despite his numerous health problems Mr. Wheat claims that he is doing well. He did have cholecystitis recently and had a percutaneous drain placed for that. He is not having any issues related to that currently. He gets short of breath when he is mowing his yard. He does not have shortness of breath walking up and down his driveway. He denies any chest pain, pressure, or tightness. His weight has been stable. He has an occasional cough, but no hemoptysis. He denies wheezing. He does have reflux and occasional indigestion, as well as arthritis. He has been under a great deal of stress recently because his wife  is currently undergoing chemotherapy for breast cancer.  Past Medical History  Diagnosis Date  . BRADYCARDIA 10/24/2010  . CALF PAIN, RIGHT 10/22/2009  . 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 Dysuria 10/10/2010  . ERECTILE DYSFUNCTION 04/05/2007  . FATIGUE 10/02/2007  . GLUCOSE INTOLERANCE 07/15/2008  . HYPERLIPIDEMIA 07/30/2008  . HYPERSOMNIA 10/02/2007  . INSOMNIA-SLEEP DISORDER-UNSPEC 10/22/2009  . LOW BACK PAIN 04/05/2007  . OSTEOARTHRITIS 04/05/2007  . PEPTIC ULCER DISEASE 04/05/2007  . PERIPHERAL EDEMA 10/22/2009  . PERIPHERAL VASCULAR DISEASE 07/30/2008  . PROSTATE CANCER, HX OF 04/05/2007  . RESTLESS LEG SYNDROME 05/15/2007  . RHINITIS, ALLERGIC NOS 05/15/2007  . SHOULDER PAIN, LEFT 01/22/2008  . SINUSITIS- ACUTE-NOS 05/15/2007  . Unspecified essential hypertension 04/05/2007  . Unspecified visual loss 07/15/2008  . UTI 10/24/2010  . Impaired glucose tolerance 02/09/2011  . Coronary artery disease      95% followed by 80% mid LAD stenosis, circumflex 80% stenosis. There was a moderate size branching obtuse marginal with 90-95% stenosis in the inferior branch. The right coronary artery had proximal 30% stenosis. Posterior lateral was moderate-sized with 40% stenosis. The EF was 65-70%. He had a DES and  x 2 to the LAD and DES x 2 to the circumflex 03/08/12.    [name] GERD (gastroesophageal reflux disease)   . Emphysema 04/01/2012    By CT chest, august 2013  . CKD (chronic kidney disease), stage II  Past Surgical History  Procedure Laterality Date  . Inguinal herniorrhapy left  2002    x 2  . Rotator cuff repair      left  . Knee surgery      left  . S/p ventral surgery  2009  . Prostatectomy  2002    radical  . Right hip replacement  09/22/09  . Left hip replacement    . Eye surgery      right  . Percutaneous coronary stent intervention (pci-s) N/A 03/08/2012    Procedure: PERCUTANEOUS CORONARY STENT INTERVENTION (PCI-S);  Surgeon: Burnell Blanks, MD;  Location: [facility] CATH LAB;   Service: Cardiovascular;  Laterality: N/A;      Current Outpatient Prescriptions  Medication Sig Dispense Refill  . acetaminophen (TYLENOL) 325 MG tablet Take 2 tablets (650 mg total) by mouth every 6 (six) hours as needed for mild pain (or Fever >/= 101). 30 tablet 1  . alum & mag hydroxide-simeth (MAALOX/MYLANTA) 200-200-20 MG/5ML suspension Take 30 mLs by mouth every 6 (six) hours as needed for indigestion or heartburn.    [name] amLODipine (NORVASC) 10 MG tablet Take 1 tablet (10 mg total) by mouth daily. 90 tablet 3  . aspirin 81 MG EC tablet Take 81 mg by mouth daily.      . citalopram (CELEXA) 40 MG tablet Take 1 tablet (40 mg total) by mouth daily. 90 tablet 3  . cloNIDine (CATAPRES) 0.1 MG tablet Take 1 tablet by mouth every AM and 2 tablets by mouth every PM 270 tablet 3  . clopidogrel (PLAVIX) 75 MG tablet Take 1 tablet (75 mg total) by mouth daily. 90 tablet 1  . isosorbide mononitrate (IMDUR) 30 MG 24 hr tablet TAKE 1 TABLET (30 MG TOTAL) BY MOUTH DAILY. 30 tablet 5  . loratadine (CLARITIN) 10 MG tablet Take 10 mg by mouth daily.    . nitroGLYCERIN (NITROSTAT) 0.4 MG SL tablet Place 1 tablet (0.4 mg total) under the tongue every 5 (five) minutes as needed for chest pain. 25 tablet 6  . pantoprazole (PROTONIX) 40 MG tablet Take 1 tablet (40 mg total) by mouth daily. 30 tablet 11  . pramipexole (MIRAPEX) 1 MG tablet TAKE 1 TABLET (1 MG TOTAL) BY MOUTH 3 (THREE) TIMES DAILY. 90 tablet 5   No current facility-administered medications for this visit.    Physical Exam BP 118/71 mmHg  Pulse 81  Resp 16  Ht '5\' 7"'$  (1.702 m)  Wt 159 lb (72.122 kg)  BMI 24.90 kg/m2  SpO46 47% 74 year old man in no acute distress Well-developed and well-nourished Alert and oriented 3 with no focal neurologic deficits No cervical or subclavicular adenopathy Lungs clear bilaterally Cardiac regular rate and rhythm normal S1 and S2 no rubs or murmurs Abdomen soft and nontender nondistended Extremities  without clubbing cyanosis or edema Skin : Rash under her dressing where percutaneous drain had been in place.  Diagnostic Tests: CT CHEST WITHOUT CONTRAST  TECHNIQUE: Multidetector CT imaging of the chest was performed following the standard protocol without IV contrast.  COMPARISON: CT 11/05/2014 go  FINDINGS: Mediastinum/Nodes: No axillary supraclavicular adenopathy. No mediastinal hilar adenopathy. No pericardial fluid. Coronary calcifications are present. Esophagus normal.  Lungs/Pleura: The peripheral ground-glass nodule in the superior segment right lower lobe is increase in size measuring 16 mm on image 20, series 4 increased from 12 mm on 11/05/2014 and 9 mm on 04/08/2013.  There is a new small right pleural effusion. No new pulmonary nodules  Upper abdomen: Limited  view of the liver, kidneys, pancreas are unremarkable. Normal adrenal glands.  Musculoskeletal: No aggressive osseous lesion.  IMPRESSION: 1. Mild progressive enlargement of ground-glass nodule in the superior segment of the right lower lobe. Recommend percutaneous biopsy (if assessable) to evaluate for a slow-growing adenocarcinoma. 2. New right pleural effusion.   Electronically Signed  By: Suzy Bouchard M.D.  On: 02/23/2015 14:20 I personally reviewed the CT scanning compared to his previous scan. I concur with the above findings. The pleural effusion is likely related to his cholecystitis. \ Impression: Mr. Walker is a 74 year old gentleman with a groundglass opacity in the superior segment of the right lower lobe. This had very slowly progressed over time for a couple of years. However over the past 3 months it has gotten significantly bigger going from 12-16 mm. This is highly suspicious for an adenocarcinoma. He also has a new right pleural effusion on his scan. I strongly suspect this is related to his cholecystitis and percutaneous drain. I cannot completely rule out the  possibility of malignant effusion, but I think that is unlikely. I recommended that we proceed with right VATS and superior segmentectomy of right lower lobe. He wanted to delay this again due to his wife's chemotherapy. However, at this point I do not think that is wise given the significant change in size over the past 3 months. I reviewed the general nature of the procedure, the need for general anesthesia, the incisions to be used, the use of drainage tubes postoperatively, the expected hospital stay, and the overall recovery. I reviewed the indications, risks, benefits, and alternatives. He understands the risks include, but are not limited to death, MI, DVT, PE, stroke, bleeding, possible need for transfusion, infection, prolonged air leak, cardiac arrhythmias, as well as the possibility of other unforeseeable complications.   He accepts the risk and agrees to proceed. He does want to wait until after his wife's next chemotherapy cycle which is scheduled to be given on August 4.   Plan:  Right VATS, Right lower lobe superior segmentectomy on Monday 03/22/2015  I spent 25 minutes face to face with Mr. Aguayo during this visit. > 50% of the time was spent in counseling Melrose Nakayama, MD [facility] [phone]

## 2015-03-23 ENCOUNTER — Inpatient Hospital Stay (HOSPITAL_COMMUNITY): Payer: Medicare Other

## 2015-03-23 ENCOUNTER — Encounter (HOSPITAL_COMMUNITY): Payer: Self-pay | Admitting: Thoracic Surgery (Cardiothoracic Vascular Surgery)

## 2015-03-23 LAB — CBC
HEMATOCRIT: 32.9 % — AB (ref 39.0–52.0)
Hemoglobin: 10.4 g/dL — ABNORMAL LOW (ref 13.0–17.0)
MCH: 28.7 pg (ref 26.0–34.0)
MCHC: 31.6 g/dL (ref 30.0–36.0)
MCV: 90.9 fL (ref 78.0–100.0)
PLATELETS: 108 10*3/uL — AB (ref 150–400)
RBC: 3.62 MIL/uL — ABNORMAL LOW (ref 4.22–5.81)
RDW: 13.9 % (ref 11.5–15.5)
WBC: 11.8 10*3/uL — ABNORMAL HIGH (ref 4.0–10.5)

## 2015-03-23 LAB — BLOOD GAS, ARTERIAL
ACID-BASE DEFICIT: 0.7 mmol/L (ref 0.0–2.0)
BICARBONATE: 24.5 meq/L — AB (ref 20.0–24.0)
Drawn by: 419771
O2 Content: 4 L/min
O2 Saturation: 93.6 %
PO2 ART: 62.2 mmHg — AB (ref 80.0–100.0)
Patient temperature: 98.6
TCO2: 26 mmol/L (ref 0–100)
pCO2 arterial: 48.6 mmHg — ABNORMAL HIGH (ref 35.0–45.0)
pH, Arterial: 7.323 — ABNORMAL LOW (ref 7.350–7.450)

## 2015-03-23 LAB — BASIC METABOLIC PANEL
ANION GAP: 4 — AB (ref 5–15)
BUN: 13 mg/dL (ref 6–20)
CHLORIDE: 109 mmol/L (ref 101–111)
CO2: 26 mmol/L (ref 22–32)
CREATININE: 1.54 mg/dL — AB (ref 0.61–1.24)
Calcium: 8.1 mg/dL — ABNORMAL LOW (ref 8.9–10.3)
GFR, EST AFRICAN AMERICAN: 49 mL/min — AB (ref >60)
GFR, EST NON AFRICAN AMERICAN: 42 mL/min — AB (ref >60)
GLUCOSE: 152 mg/dL — AB (ref 65–99)
Potassium: 3.9 mmol/L (ref 3.5–5.1)
SODIUM: 139 mmol/L (ref 135–145)

## 2015-03-23 MED ORDER — ENOXAPARIN SODIUM 40 MG/0.4ML ~~LOC~~ SOLN
40.0000 mg | SUBCUTANEOUS | Status: DC
  Administered 2015-03-23: 40 mg via SUBCUTANEOUS
  Filled 2015-03-23 (×3): qty 0.4

## 2015-03-23 MED ORDER — CHLORHEXIDINE GLUCONATE CLOTH 2 % EX PADS
6.0000 | MEDICATED_PAD | Freq: Every day | CUTANEOUS | Status: AC
  Administered 2015-03-23 – 2015-03-27 (×5): 6 via TOPICAL

## 2015-03-23 MED ORDER — MUPIROCIN 2 % EX OINT
1.0000 "application " | TOPICAL_OINTMENT | Freq: Two times a day (BID) | CUTANEOUS | Status: DC
  Administered 2015-03-23 – 2015-03-27 (×9): 1 via NASAL
  Filled 2015-03-23: qty 22

## 2015-03-23 NOTE — Op Note (Signed)
Victor Davis, Victor Davis NO.:  [id number]  MEDICAL RECORD NO.:  [id number]  LOCATION:  2S01C                        FACILITY:  [facility]  PHYSICIAN:  Revonda Standard. Roxan Hockey, M.D.DATE OF BIRTH:  1941-03-12  DATE OF PROCEDURE:  03/22/2015 DATE OF DISCHARGE:                              OPERATIVE REPORT   PREOPERATIVE DIAGNOSIS:  Right lower lobe lung nodule.  POSTOPERATIVE DIAGNOSIS:  Right lower lobe lung nodule.  PROCEDURE:   Right video-assisted thoracoscopy Thoracoscopic right lower lobe superior segmentectomy Lymph node sampling On-Q local anesthetic catheter placement.  SURGEONS:  Revonda Standard. Roxan Hockey, M.D.  FIRST ASSISTANT:  John Giovanni, PA-C.  ANESTHESIA:  General.  FINDINGS:  Right lower lobe superior segmentectomy. Frozen- possible inflammatory nodule, but could not rule out malignancy on frozen section. 1 cm from the closest margin.  CLINICAL NOTE:  Victor Davis is a 74 year old gentleman with a history of tobacco abuse.  He has been followed with a ground-glass opacity in the right lower lobe for approximately 3 years.  This recently had increased in size and he was advised to undergo surgical resection. Given the location of the lesion, it was felt that a superior segmentectomy would be definitive treatment, so the plan was to proceed with right VATS and thoracoscopic superior segmentectomy for definitive diagnosis and treatment.  The indications, risks, benefits, and alternatives were discussed in detail with the patient.  He understood and accepted the risks and agreed to proceed.  OPERATIVE NOTE:  Victor Davis was brought to the preoperative holding area on March 22, 2015.  Anesthesia placed a central line and an arterial blood pressure monitoring line.  He was taken to the operating room, anesthetized, and intubated with a double-lumen endotracheal tube. Intravenous antibiotics were administered.  A Foley catheter was placed. Sequential  compressive devices were placed on the calves for DVT prophylaxis.  He was placed in a left lateral decubitus position and the right chest was prepped and draped in the usual sterile fashion.  Single lung ventilation of the left lung was initiated, was tolerated well throughout the procedure.  An incision was made in the seventh intercostal space in the midaxillary line in the right chest.  The chest was entered using a 5 mm port.  The thoracoscope was advanced into the chest.  There was good isolation of the right lung.  There was air trapping, so suction was applied through the ET tube. After doing so, there was good deflation of the right lung.  There was no pleural effusion and no abnormality of the parietal or visceral pleura was noted.  A 5 cm utility incision was made in the fourth interspace anterolaterally.  No rib spreading was performed during the procedure.  Dissection initially was begun in the major fissure, which was incomplete.  Careful dissection was performed down to the pulmonary arteries.  The basilar segmental branch of the pulmonary artery was identified initially. There was a node adjacent to this that bled when dissected.  Tthe inferior ligament was divided with electrocautery.  The level 9 lymph node was removed and sent for permanent pathology.  Dissection then was carried back to the fissure.  The dissection was carried  over the pulmonary artery and the superior segmental branch was identified. A portion of the fissure was completed with a stapler. All lymph nodes that were encountered during the dissection were sent for permanent pathology.  The superior segmental arterial branch was dissected free of surrounding tissue, encircled and divided with an endoscopic vascular stapler.  The remainder of the major fissure then was completed with sequential firings of an endoscopic GIA stapler.  The superior segmental bronchus was identified.  While dissecting the superior  segmental bronchus out, a small branch was avulsed from the basilar segmental pulmonary artery.  Light pressure was held on this area for 15 minutes and the bleeding stopped.  No additional measures were necessary.  The dissection of the superior segmental bronchus then was completed.  A stapler was placed across the superior segmental bronchus and closed.  A test inflation showed good aeration of the upper and middle lobes as well as the basilar segments of the lower lobe.  The stapler was fired transecting the bronchus.  The superior segmentectomy then was completed with sequential firings of endoscopic GIA stapler along the demarcation between the superior segment and basilar segments.  The specimen was placed into an endoscopic retrieval bag, removed, and sent for frozen section.  While awaiting the frozen section, a level 7 node was identified and removed and sent for pathology.  There was some bleeding from this area, which was controlled with Surgicel.  The chest was copiously irrigated with warm saline.  A test inflation showed no leakage from the bronchial stump.  There was some leakage from the right upper lobe along the fissure. An On-Q local anesthetic catheter was placed through a separate stab incision posteriorly and tunneled into a subpleural location was primed with 5 mL of 0.5% Marcaine.  It was secured to skin with a 3-0 silk suture.  The frozen section returned showing a possible inflammatory nodule. Malignancy could not be definitively ruled out on the frozen section. There was a 1 cm margin from the nearest staple line.  A 28-French chest tube was placed through the original port incision and directed apically, it was secured to skin with #1 silk suture.  Progel was applied to the staple along the right upper lobe.  The right lung was reinflated.  The incision was closed in 3 layers with a running #1 Vicryl fascial suture, followed by a 2-0 Vicryl subcutaneous suture,  and 3-0 Vicryl subcuticular suture.  All sponge, needle, and instrument counts were correct at the end of the procedure.  The patient was taken from the operating room to the postanesthetic care unit, extubated and in good condition.     Revonda Standard Roxan Hockey, M.D.     SCH/MEDQ  D:  03/22/2015  T:  03/23/2015  Job:  [id number]

## 2015-03-23 NOTE — Progress Notes (Signed)
Utilization Review Completed.  

## 2015-03-23 NOTE — Care Management Note (Addendum)
Case Management Note  Patient Details  Name: Victor Davis MRN: [id number] Date of Birth: 1941-02-20  Subjective/Objective:      Pt lives with spouse who has just finished chemo and starting radiation therapy for CA - states she is still independent, driving, etc.  Requests CM call after 3:00 p.m. today and request she bring cell phone to him so he can talk with other family members.   Pt OOB to chair, eating breakfast, anticipating becoming more mobile.           Action/Plan: Continue to follow for discharge needs.              Expected Discharge Plan:  Home/Self Care  Discharge planning Services  CM Consult  Status of Service:  In process, will continue to follow   Girard Cooter, RN 03/23/2015, 11:01 AM   CM talked with spouse who was being admitted to [facility], Room 1343 - pt aware.

## 2015-03-23 NOTE — Progress Notes (Signed)
TCTS BRIEF SICU PROGRESS NOTE  1 Day Post-Op  S/P Procedure(s) (LRB): RIGHT VIDEO ASSISTED THORACOSCOPY (Right) RIGHT LOWER LOBE SUPERIOR SEGMENTECTOMY (Right) LYMPH NODE DISSECTION (Right)   Stable day Sleepy but otherwise doing well NSR w/ stable BP Breathing comfortably UOP adequate  Plan: Continue current plan  Rexene Alberts 03/23/2015 6:41 PM

## 2015-03-23 NOTE — Progress Notes (Signed)
1 Day Post-Op Procedure(s) (LRB): RIGHT VIDEO ASSISTED THORACOSCOPY (Right) RIGHT LOWER LOBE SUPERIOR SEGMENTECTOMY (Right) LYMPH NODE DISSECTION (Right) Subjective: Not complaining of much pain but poor cough/ IS Denies nausea  Objective: Vital signs in last 24 hours: Temp:  [96.8 F (36 C)-98.8 F (37.1 C)] 98.3 F (36.8 C) (08/09 0400) Pulse Rate:  [52-93] 61 (08/09 0700) Cardiac Rhythm:  [-] Sinus bradycardia;Normal sinus rhythm (08/08 2000) Resp:  [11-28] 16 (08/09 0700) BP: (104-168)/(49-106) 168/60 mmHg (08/09 0500) SpO2:  [52 %-100 %] 96 % (08/09 0700) Arterial Line BP: (74-199)/(36-118) 74/36 mmHg (08/09 0700) Weight:  [153 lb (69.4 kg)] 153 lb (69.4 kg) (08/09 0531)  Hemodynamic parameters for last 24 hours:    Intake/Output from previous day: 08/08 0701 - 08/09 0700 In: 3250 [P.O.:125; I.V.:3075; IV Piggyback:50] Out: 1934 [Urine:1470; Blood:200; Chest Tube:264] Intake/Output this shift:    General appearance: alert, cooperative and no distress Neurologic: intact Heart: regular rate and rhythm Lungs: diminished breath sounds bibasilar Abdomen: normal findings: soft, non-tender no air leak, serosanguinous drainage from CT  Lab Results:  Recent Labs  03/23/15 0537  WBC 11.8*  HGB 10.4*  HCT 32.9*  PLT 108*   BMET:  Recent Labs  03/23/15 0537  NA 139  K 3.9  CL 109  CO2 26  GLUCOSE 152*  BUN 13  CREATININE 1.54*  CALCIUM 8.1*    PT/INR: No results for input(s): LABPROT, INR in the last 72 hours. ABG    Component Value Date/Time   PHART 7.323* 03/23/2015 0506   HCO3 24.5* 03/23/2015 0506   TCO2 26.0 03/23/2015 0506   ACIDBASEDEF 0.7 03/23/2015 0506   O2SAT 93.6 03/23/2015 0506   CBG (last 3)  No results for input(s): GLUCAP in the last 72 hours.  Assessment/Plan: S/P Procedure(s) (LRB): RIGHT VIDEO ASSISTED THORACOSCOPY (Right) RIGHT LOWER LOBE SUPERIOR SEGMENTECTOMY (Right) LYMPH NODE DISSECTION (Right) Plan for transfer to  step-down: see transfer orders   POD # 1 superior segmentectomy  CV- hypertension- back on home meds  Resume plavix  RESP- no air leak- CT to water seal  IS difficult for him, will add flutter valve  RENAL- CKD- creatinine below baseline  DVT prophylaxis- SCD, add enoxaparin  OOB, ambulate   LOS: 1 day    Melrose Nakayama 03/23/2015

## 2015-03-24 ENCOUNTER — Inpatient Hospital Stay (HOSPITAL_COMMUNITY): Payer: Medicare Other

## 2015-03-24 LAB — COMPREHENSIVE METABOLIC PANEL
ALK PHOS: 57 U/L (ref 38–126)
ALT: 9 U/L — AB (ref 17–63)
AST: 13 U/L — AB (ref 15–41)
Albumin: 2.3 g/dL — ABNORMAL LOW (ref 3.5–5.0)
Anion gap: 3 — ABNORMAL LOW (ref 5–15)
BUN: 13 mg/dL (ref 6–20)
CALCIUM: 8.3 mg/dL — AB (ref 8.9–10.3)
CO2: 27 mmol/L (ref 22–32)
CREATININE: 1.44 mg/dL — AB (ref 0.61–1.24)
Chloride: 107 mmol/L (ref 101–111)
GFR calc Af Amer: 53 mL/min — ABNORMAL LOW (ref >60)
GFR, EST NON AFRICAN AMERICAN: 46 mL/min — AB (ref >60)
Glucose, Bld: 130 mg/dL — ABNORMAL HIGH (ref 65–99)
Potassium: 3.6 mmol/L (ref 3.5–5.1)
Sodium: 137 mmol/L (ref 135–145)
TOTAL PROTEIN: 4.8 g/dL — AB (ref 6.5–8.1)
Total Bilirubin: 1 mg/dL (ref 0.3–1.2)

## 2015-03-24 LAB — CBC
HEMATOCRIT: 28.7 % — AB (ref 39.0–52.0)
HEMOGLOBIN: 9 g/dL — AB (ref 13.0–17.0)
MCH: 28.2 pg (ref 26.0–34.0)
MCHC: 31.4 g/dL (ref 30.0–36.0)
MCV: 90 fL (ref 78.0–100.0)
Platelets: 92 10*3/uL — ABNORMAL LOW (ref 150–400)
RBC: 3.19 MIL/uL — ABNORMAL LOW (ref 4.22–5.81)
RDW: 13.8 % (ref 11.5–15.5)
WBC: 7.9 10*3/uL (ref 4.0–10.5)

## 2015-03-24 MED ORDER — LEVALBUTEROL HCL 0.63 MG/3ML IN NEBU
0.6300 mg | INHALATION_SOLUTION | Freq: Two times a day (BID) | RESPIRATORY_TRACT | Status: DC
  Filled 2015-03-24 (×2): qty 3

## 2015-03-24 MED ORDER — POTASSIUM CHLORIDE CRYS ER 20 MEQ PO TBCR
20.0000 meq | EXTENDED_RELEASE_TABLET | ORAL | Status: DC | PRN
  Administered 2015-03-24: 20 meq via ORAL
  Filled 2015-03-24: qty 1

## 2015-03-24 NOTE — Progress Notes (Signed)
2 Days Post-Op Procedure(s) (LRB): RIGHT VIDEO ASSISTED THORACOSCOPY (Right) RIGHT LOWER LOBE SUPERIOR SEGMENTECTOMY (Right) LYMPH NODE DISSECTION (Right) Subjective: Pain better "that pill makes me sleepy"  Objective: Vital signs in last 24 hours: Temp:  [97.4 F (36.3 C)-100.1 F (37.8 C)] 97.4 F (36.3 C) (08/10 0352) Pulse Rate:  [57-99] 60 (08/10 0700) Cardiac Rhythm:  [-] Normal sinus rhythm (08/09 2000) Resp:  [14-23] 14 (08/10 0700) BP: (94-160)/(47-87) 106/51 mmHg (08/10 0700) SpO2:  [91 %-96 %] 94 % (08/10 0700) Weight:  [153 lb 3.5 oz (69.5 kg)] 153 lb 3.5 oz (69.5 kg) (08/10 0600)  Hemodynamic parameters for last 24 hours:    Intake/Output from previous day: 08/09 0701 - 08/10 0700 In: 1730 [P.O.:480; I.V.:1250] Out: 1435 [Urine:1235; Chest Tube:200] Intake/Output this shift:    General appearance: alert, cooperative and no distress Neurologic: intact Heart: regular rate and rhythm Lungs: diminished breath sounds bibasilar Abdomen: normal findings: soft, non-tender no air leak  Lab Results:  Recent Labs  03/23/15 0537 03/24/15 0437  WBC 11.8* 7.9  HGB 10.4* 9.0*  HCT 32.9* 28.7*  PLT 108* 92*   BMET:  Recent Labs  03/23/15 0537 03/24/15 0437  NA 139 137  K 3.9 3.6  CL 109 107  CO2 26 27  GLUCOSE 152* 130*  BUN 13 13  CREATININE 1.54* 1.44*  CALCIUM 8.1* 8.3*    PT/INR: No results for input(s): LABPROT, INR in the last 72 hours. ABG    Component Value Date/Time   PHART 7.323* 03/23/2015 0506   HCO3 24.5* 03/23/2015 0506   TCO2 26.0 03/23/2015 0506   ACIDBASEDEF 0.7 03/23/2015 0506   O2SAT 93.6 03/23/2015 0506   CBG (last 3)  No results for input(s): GLUCAP in the last 72 hours.  Assessment/Plan: S/P Procedure(s) (LRB): RIGHT VIDEO ASSISTED THORACOSCOPY (Right) RIGHT LOWER LOBE SUPERIOR SEGMENTECTOMY (Right) LYMPH NODE DISSECTION (Right) -  Doing well  No air leak and minimal drainage - dc CT  DC PCA  Increase  ambulation  Thrombocytopenia- will hold enoxaparin, continue SCD for DVT prophylaxis  Transfer to stepdown when bed available   LOS: 2 days    Melrose Nakayama 03/24/2015

## 2015-03-24 NOTE — Progress Notes (Signed)
8 mls of Fentanyl wasted in the sink with Vickii Penna, RN.

## 2015-03-25 ENCOUNTER — Inpatient Hospital Stay (HOSPITAL_COMMUNITY): Payer: Medicare Other

## 2015-03-25 LAB — BASIC METABOLIC PANEL
Anion gap: 6 (ref 5–15)
BUN: 14 mg/dL (ref 6–20)
CO2: 27 mmol/L (ref 22–32)
Calcium: 8.7 mg/dL — ABNORMAL LOW (ref 8.9–10.3)
Chloride: 104 mmol/L (ref 101–111)
Creatinine, Ser: 1.39 mg/dL — ABNORMAL HIGH (ref 0.61–1.24)
GFR calc Af Amer: 56 mL/min — ABNORMAL LOW (ref >60)
GFR calc non Af Amer: 48 mL/min — ABNORMAL LOW (ref >60)
Glucose, Bld: 100 mg/dL — ABNORMAL HIGH (ref 65–99)
POTASSIUM: 4.1 mmol/L (ref 3.5–5.1)
Sodium: 137 mmol/L (ref 135–145)

## 2015-03-25 LAB — CBC
HCT: 29.5 % — ABNORMAL LOW (ref 39.0–52.0)
Hemoglobin: 9.7 g/dL — ABNORMAL LOW (ref 13.0–17.0)
MCH: 29.7 pg (ref 26.0–34.0)
MCHC: 32.9 g/dL (ref 30.0–36.0)
MCV: 90.2 fL (ref 78.0–100.0)
PLATELETS: 99 10*3/uL — AB (ref 150–400)
RBC: 3.27 MIL/uL — AB (ref 4.22–5.81)
RDW: 13.5 % (ref 11.5–15.5)
WBC: 7.7 10*3/uL (ref 4.0–10.5)

## 2015-03-25 MED ORDER — LEVALBUTEROL HCL 0.63 MG/3ML IN NEBU: 0.6300 mg | INHALATION_SOLUTION | Freq: Four times a day (QID) | RESPIRATORY_TRACT | Status: DC | PRN

## 2015-03-25 MED ORDER — GUAIFENESIN ER 600 MG PO TB12
1200.0000 mg | ORAL_TABLET | Freq: Two times a day (BID) | ORAL | Status: DC
  Administered 2015-03-25 – 2015-03-27 (×5): 1200 mg via ORAL
  Filled 2015-03-25 (×7): qty 2

## 2015-03-25 NOTE — Progress Notes (Signed)
Pt oxygen saturation after ambulating was 95% on room air.  Pt had dyspnea on exertion, but oxygen saturation remained WNL.

## 2015-03-25 NOTE — Care Management Important Message (Signed)
Important Message  Patient Details  Name: Victor Davis MRN: [id number] Date of Birth: Jul 25, 1941   Medicare Important Message Given:  Yes-second notification given    Nathen May 03/25/2015, 2:51 [place] Message  Patient Details  Name: Victor Davis MRN: [id number] Date of Birth: 10/26/1940   Medicare Important Message Given:  Yes-second notification given    Nathen May 03/25/2015, 2:51 PM

## 2015-03-25 NOTE — Progress Notes (Addendum)
[address]       [place] [address]             [phone]      3 Days Post-Op Procedure(s) (LRB): RIGHT VIDEO ASSISTED THORACOSCOPY (Right) RIGHT LOWER LOBE SUPERIOR SEGMENTECTOMY (Right) LYMPH NODE DISSECTION (Right) Subjective: Feels ok, + prod cough  Objective: Vital signs in last 24 hours: Temp:  [97.1 F (36.2 C)-98.2 F (36.8 C)] 97.8 F (36.6 C) (08/11 0620) Pulse Rate:  [67-78] 67 (08/11 0620) Cardiac Rhythm:  [-] Normal sinus rhythm (08/10 1937) Resp:  [18-28] 19 (08/10 2157) BP: (122-145)/(58-72) 141/72 mmHg (08/11 0620) SpO2:  [96 %-100 %] 97 % (08/11 0620)  Hemodynamic parameters for last 24 hours:    Intake/Output from previous day: 08/10 0701 - 08/11 0700 In: 260 [P.O.:240; I.V.:20] Out: 1875 [Urine:1785; Chest Tube:90] Intake/Output this shift:    General appearance: alert, cooperative and no distress Heart: regular rate and rhythm Lungs: + exp wheeze , dim in right base Abdomen: benign Extremities: PAS in place Wound: incis healing well  Lab Results:  Recent Labs  03/24/15 0437 03/25/15 0511  WBC 7.9 7.7  HGB 9.0* 9.7*  HCT 28.7* 29.5*  PLT 92* 99*   BMET:  Recent Labs  03/24/15 0437 03/25/15 0511  NA 137 137  K 3.6 4.1  CL 107 104  CO2 27 27  GLUCOSE 130* 100*  BUN 13 14  CREATININE 1.44* 1.39*  CALCIUM 8.3* 8.7*    PT/INR: No results for input(s): LABPROT, INR in the last 72 hours. ABG    Component Value Date/Time   PHART 7.323* 03/23/2015 0506   HCO3 24.5* 03/23/2015 0506   TCO2 26.0 03/23/2015 0506   ACIDBASEDEF 0.7 03/23/2015 0506   O2SAT 93.6 03/23/2015 0506   CBG (last 3)  No results for input(s): GLUCAP in the last 72 hours.  Meds Scheduled Meds: . amLODipine  10 mg Oral Daily  . antiseptic oral rinse  7 mL Mouth Rinse q12n4p  . aspirin EC  81 mg Oral Daily  . bisacodyl  10 mg Oral Daily  . Chlorhexidine Gluconate Cloth  6 each Topical Q0600  . citalopram  40 mg Oral Daily  .  cloNIDine  0.1 mg Oral Daily  . cloNIDine  0.2 mg Oral QHS  . clopidogrel  75 mg Oral Daily  . isosorbide mononitrate  30 mg Oral Daily  . levalbuterol  0.63 mg Nebulization BID  . loratadine  10 mg Oral Daily  . mupirocin ointment  1 application Nasal BID  . pantoprazole  40 mg Oral Daily  . pramipexole  1 mg Oral TID  . senna-docusate  1 tablet Oral QHS   Continuous Infusions: . dextrose 5 % and 0.9% NaCl 10 mL/hr at 03/24/15 1000   PRN Meds:.hydrALAZINE, naloxone **AND** sodium chloride, ondansetron (ZOFRAN) IV, oxyCODONE, potassium chloride, potassium chloride, traMADol  Xrays Dg Chest Port 1 View  03/25/2015   CLINICAL DATA:  Post VATS on 03/22/2015  EXAM: PORTABLE CHEST - 1 VIEW  COMPARISON:  Portable exam 0712 hours compared 03/24/2015  FINDINGS: RIGHT jugular central venous catheter with tip projecting over SVC.  Normal heart size, mediastinal contours, and pulmonary vascularity.  Increased atelectasis versus consolidation RIGHT middle lobe.  Minimal LEFT basilar atelectasis.  Remaining lungs clear.  No pleural effusion or pneumothorax.  IMPRESSION: Increased atelectasis versus consolidation at RIGHT middle lobe.   Electronically Signed   By: Lavonia Dana M.D.   On: 03/25/2015  08:04   Dg Chest Port 1 View  03/24/2015   CLINICAL DATA:  Status post chest tube removal.  Follow-up exam.  EXAM: PORTABLE CHEST - 1 VIEW  COMPARISON:  03/24/2015 at 5:55 a.m.  FINDINGS: Right chest tube has been removed.  There is no pneumothorax.  Lung base atelectasis is mildly improved on the right, stable on the left. No pulmonary edema or convincing pneumonia. No obvious pleural effusion.  Right internal jugular central venous line is stable.  IMPRESSION: 1. Status post right chest tube removal.  No pneumothorax. 2. Mild improvement in right lung base atelectasis. No other change.   Electronically Signed   By: Lajean Manes M.D.   On: 03/24/2015 12:53   Dg Chest Port 1 View  03/24/2015   CLINICAL DATA:   Partial lobectomy right lung.  EXAM: PORTABLE CHEST - 1 VIEW  COMPARISON:  03/23/2015.  FINDINGS: Right IJ line and right chest tube in stable position. Stable cardiomegaly. Persistent postsurgical changes right lung base with subsegmental atelectasis. No pleural effusion or pneumothorax.  IMPRESSION: 1. Right IJ line and right chest tube in stable position. 2. Persistent right base postsurgical/atelectatic changes. No pneumothorax. 3. Stable cardiomegaly.   Electronically Signed   By: Marcello Moores  Register   On: 03/24/2015 07:37    Assessment/Plan: S/P Procedure(s) (LRB): RIGHT VIDEO ASSISTED THORACOSCOPY (Right) RIGHT LOWER LOBE SUPERIOR SEGMENTECTOMY (Right) LYMPH NODE DISSECTION (Right)  1 Doing well, + bronchitis- cont nebs/pulm toilet. , afebrile , no leukocytosis. CXR does look like atx v infiltrate is a bit worse. Hold on abx for now. 2 wean O2 3 creat improved 4 some HTN- monitor on current rx  LOS: 3 days    GOLD,WAYNE E 03/25/2015  Patient seen and examined, agree with above He has increased atelectasis in RML on CXR, but there may be a component of fluid in the fissure as well Will continue flutter, add mucinex, no indication for antibiotics at this point  [place]. Roxan Hockey, MD [facility] [phone]

## 2015-03-26 ENCOUNTER — Inpatient Hospital Stay (HOSPITAL_COMMUNITY): Payer: Medicare Other

## 2015-03-26 NOTE — Discharge Instructions (Signed)
°  ACTIVITY:  1.Increase activity slowly. 2.Walk daily and increase frequency and duration as tolerates. 3.May walk up steps. 4.No lifting more than ten pounds for two weeks. 5.No driving for two weeks. 6.Avoid straining. 7.STOP any activity that causes chest pain, shortness of breath, dizziness,sweating,     or excessive weakness. 8.Continue with breathing exercises daily.  DIET:  Low sugar, low fat, and low salt diet   WOUND:  1.May shower. 2.Clean wounds with mild soap and water.  Call the office at [phone] if any problems arise.   Video-Assisted Thoracic Surgery Care After Refer to this sheet in the next few weeks. These instructions provide you with information on caring for yourself after your procedure. Your caregiver may also give you more specific instructions. Your procedure has been planned according to current medical practices, but problems sometimes occur. Call your caregiver if you have any problems or questions after your procedure. HOME CARE INSTRUCTIONS   Only take over-the-counter or prescription medications as directed.  Only take pain medications (narcotics) as directed.  Do not drive until your caregiver approves. Driving while taking narcotics or soon after surgery can be dangerous, so discuss the specific timing with your caregiver.  Avoid activities that use your chest muscles, such as lifting heavy objects, for at least 3-4 weeks.   Take deep breaths to expand the lungs and to protect against pneumonia.  Do breathing exercises as directed by your caregiver. If you were given an incentive spirometer to help with breathing, use it as directed.  You may resume a normal diet and activities when you feel you are able to or as directed.  Do not take a bath until your caregiver says it is OK. Use the shower instead.   Keep the bandage (dressing) covering the area where the chest tube was inserted (incision site) dry for 48 hours. After 48 hours,  remove the dressing unless there is new drainage.  Remove dressings as directed by your caregiver.  Change dressings if necessary or as directed.  Keep all follow-up appointments. It is important for you to see your caregiver after surgery to discuss appropriate follow-up care and surveillance, if it is necessary. SEEK MEDICAL CARE:  You feel excessive or increasing pain at an incision site.  You notice bleeding, skin irritation, drainage, swelling, or redness at an incision site.  There is a bad smell coming from an incision or dressing.  It feels like your heart is fluttering or beating rapidly.  Your pain medication does not relieve your pain. SEEK IMMEDIATE MEDICAL CARE IF:   You have a fever.   You have chest pain.  You have a rash.  You have shortness of breath.  You have trouble breathing.   You feel weak, lightheaded, dizzy, or faint.  MAKE SURE YOU:   Understand these instructions.   Will watch your condition.   Will get help right away if you are not doing well or get worse. Document Released: 11/25/2012 Document Reviewed: 11/25/2012 [facility] Patient Information 2015 [place]. This information is not intended to replace advice given to you by your health care provider. Make sure you discuss any questions you have with your health care provider.

## 2015-03-26 NOTE — Progress Notes (Addendum)
      [address]       [place] [address]             [phone]       4 Days Post-Op Procedure(s) (LRB): RIGHT VIDEO ASSISTED THORACOSCOPY (Right) RIGHT LOWER LOBE SUPERIOR SEGMENTECTOMY (Right) LYMPH NODE DISSECTION (Right)  Subjective: Patient had bowel movement this am. He states his cough is better.  Objective: Vital signs in last 24 hours: Temp:  [97.5 F (36.4 C)-97.9 F (36.6 C)] 97.5 F (36.4 C) (08/12 0433) Pulse Rate:  [67-75] 67 (08/12 0433) Cardiac Rhythm:  [-] Normal sinus rhythm (08/11 2005) Resp:  [18] 18 (08/12 0433) BP: (137-153)/(65-67) 141/67 mmHg (08/12 0433) SpO2:  [97 %-100 %] 97 % (08/12 0433)     Intake/Output from previous day: 08/11 0701 - 08/12 0700 In: 370 [P.O.:370] Out: 975 [Urine:975]   Physical Exam:  Cardiovascular: Slightly bradycardic Pulmonary: Slightly diminished at bases; no rales, wheezes, or rhonchi. Abdomen: Soft, non tender, bowel sounds present. Wounds: Clean and dry.  No erythema or signs of infection.   Lab Results: CBC: Recent Labs  03/24/15 0437 03/25/15 0511  WBC 7.9 7.7  HGB 9.0* 9.7*  HCT 28.7* 29.5*  PLT 92* 99*   BMET:  Recent Labs  03/24/15 0437 03/25/15 0511  NA 137 137  K 3.6 4.1  CL 107 104  CO2 27 27  GLUCOSE 130* 100*  BUN 13 14  CREATININE 1.44* 1.39*  CALCIUM 8.3* 8.7*    PT/INR: No results for input(s): LABPROT, INR in the last 72 hours. ABG:  INR: Will add last result for INR, ABG once components are confirmed Will add last 4 CBG results once components are confirmed  Assessment/Plan:  1. CV - SR in the 60's. SBP mostly in the 130-140's. On all pre op medications: Norvasc 10 mg daily, Catapres, Imdur 30 mg daily, and Plavix 75 mg daily. 2.  Pulmonary - On room air. CXR this am shows no pneumothorax, small right pleural effusion and bibasilar atelectasis R>L. Encourage incentive spirometer and flutter valve. Continue Mucinex. 3. Anemia-H and H yesterday  stable at 9.7 and 29.5 4. Thrombocytopenia-platelets yesterday up to 99,000 5. 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 Roxan Hockey, MD [facility] [phone]

## 2015-03-26 NOTE — Discharge Summary (Signed)
Physician Discharge Summary       [address]       [place] [address]             [phone]    Patient ID: DEZ STAUFFER MRN: [id number] DOB/AGE: 11-08-1940 74 y.o.  Admit date: 03/22/2015 Discharge date: 03/26/2015  Admission Diagnoses: 1. Right lower lobe lung nodule 2. History of tobacco abuse 3. History of stroke 4. History of CAD (s/p PCI with stent) 5. History of hypertension 6. History of hyperlipidemia 7. History of CKD (stage II) 8. History of GERD 9. History of prostate cancer 10. History of glucose intolerance 11. History of PVD 12. History of restless leg syndrome 13. History of PUD 14. History of emphysema  Discharge Diagnoses:  1. Adenocarcinoma RLL 2. History of tobacco abuse 3. History of stroke 4. History of CAD (s/p PCI with stent) 5. History of hypertension 6. History of hyperlipidemia 7. History of CKD (stage II) 8. History of GERD 9. History of prostate cancer 10. History of glucose intolerance 11. History of PVD 12. History of restless leg syndrome 13. History of PUD 14. History of emphysema  Procedure (s):  Right video-assisted thoracoscopy, thoracoscopic right lower lobe superior segmentectomy, lymph node sampling, On-Q local anesthetic catheter placement by Dr. Roxan Hockey on 03/22/2015.  Pathology: 1. Lung, wedge biopsy/resection, Superior segment right lower lobe lung nodule - MINIMALLY INVASIVE WELL-DIFFERENTIATED ADENOCARCINOMA, SEE COMMENT. - NEGATIVE FOR LYMPH-VASCULAR INVASION. - BRONCHIAL VASCULAR MARGIN, NEGATIVE FOR TUMOR. - PARENCHYMAL MARGIN, NEGATIVE FOR TUMOR. - SEE TUMOR SYNOPTIC TEMPLATE BELOW. 2. Lymph node, biopsy, 9 R - ONE LYMPH NODE, NEGATIVE FOR TUMOR (0/1). 3. Lymph node, biopsy, 7 - ONE LYMPH NODE, NEGATIVE FOR TUMOR (0/1). 4. Lymph node, biopsy, 11 R - ONE LYMPH NODE, NEGATIVE FOR TUMOR (0/1). 5. Lymph node, biopsy, 12R - ONE LYMPH NODE, NEGATIVE FOR TUMOR (0/1). 6. Lymph node,  biopsy, 11 R #2 - ONE LYMPH NODE, NEGATIVE FOR TUMOR (0/1). 7. Lymph node, biopsy, 12 R #2 - ONE LYMPH NODE, NEGATIVE FOR TUMOR (0/1). TNM Code:  pT1a, pN0.  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 He has a history of tobacco abuse, smoking 2 packs of cigarettes daily for about 15 years prior to quitting in 1978. His past medical history is extensive and includes a cerebrovascular accident, coronary artery disease with stenting by Dr. Angelena Form, bradycardia, prostate cancer, arthritis, hypertension, hyperlipidemia, reflux, and emphysema. He saw Dr. Jenny Reichmann back in 2013 with a complaint of cough and shortness of breath. Chest x-ray showed a possible nodule. A CT of the chest was done and showed an 8 mm groundglass opacity in the superior segment of the right lower lobe. A year later (August 2014) the nodule measured 9 mm. On repeat CT in March 2016 the nodule had increased in size from 9 to 12 mm. There were no other suspicious findings.  I saw him around that time recommended surgical resection. His wife had just been diagnosed with breast cancer and he did not want to have surgery. He asked to delay surgery until now. He returns with a repeat CT scan.  Despite his numerous health problems, Mr. Schnapp claims that he is doing well. He did have cholecystitis recently and had a percutaneous drain placed for that. He is not having any issues related to that currently. He gets short of breath when he  is mowing his yard. He does not have shortness of breath walking up and down his driveway. He denies any chest pain, pressure, or tightness. His weight has been stable. He has an occasional cough, but no hemoptysis. He denies wheezing. He does have reflux and occasional indigestion, as well as arthritis. He has been under a great deal of stress recently because his wife is currently undergoing  chemotherapy for breast cancer. Dr. Roxan Hockey discussed that over the past 3 months it has gotten significantly bigger going from 12-16 mm. This is highly suspicious for an adenocarcinoma. He also has a new right pleural effusion on his scan. Dr. Roxan Hockey suspects this is related to his cholecystitis and percutaneous drain;however, cannot completely rule out the possibility of malignant effusion, but  is unlikely. Dr. Roxan Hockey discussed the need for a right VATS, superior segmentectomy of the RLL. Potential risks, benefits, and complications were discussed with the patient and he agreed to proceed with surgery. He was admitted on 03/22/2015 to undergo the aforementioned surgery.  Brief Hospital Course:  He remained afebrile and hemodynamically stable. His a line and foley were removed early in his post operative course.  Chest tube output gradually decreased and there was no air leak. Daily chest x rays were obtained and remained stable. Chest tube was placed to water seal on post operative day one. Chest tube was removed on post operative day 2. He had thrombocytopenia post op. As a result, he was not put on Lovenox. His last platelet count was up to 99,000. He did develop a cough and was felt to have bronchitis. He remained afebrile and WBC was "WNL" so he was not put on antibiotics. Mucinex did help with his cough. He was surgically stable for transfer from the ICU to 2000 for further convalescence on 03/25/2015. He was restarted on all of his blood pressure medications and BP was fairly well controlled. He is on room air. He is tolerating a diet and has had a bowel movement. Wound was clean and dry and there were no signs of infection. He was felt surgically stable for discharge today.  Latest Vital Signs: Blood pressure 141/67, pulse 67, temperature 97.5 F (36.4 C), temperature source Oral, resp. rate 18, height '5\' 8"'$  (1.727 m), weight 153 lb 3.5 oz (69.5 kg), SpO2 97 %.  Physical  Exam: Cardiovascular: Slightly bradycardic Pulmonary: Slightly diminished at bases; no rales, wheezes, or rhonchi. Abdomen: Soft, non tender, bowel sounds present. Wounds: Clean and dry. No erythema or signs of infection. Discharge Condition:Stable and discharged to home  Recent laboratory studies:  Lab Results  Component Value Date   WBC 7.7 03/25/2015   HGB 9.7* 03/25/2015   HCT 29.5* 03/25/2015   MCV 90.2 03/25/2015   PLT 99* 03/25/2015   Lab Results  Component Value Date   NA 137 03/25/2015   K 4.1 03/25/2015   CL 104 03/25/2015   CO2 27 03/25/2015   CREATININE 1.39* 03/25/2015   GLUCOSE 100* 03/25/2015     Diagnostic Studies: Dg Chest 2 View  03/26/2015   CLINICAL DATA:  Status toast partial right segmentectomy and lymph node dissection  EXAM: CHEST  2 VIEW  COMPARISON:  Portable chest x-ray of March 25, 2015  FINDINGS: The left lung is well-expanded. There is minimal left basilar atelectasis and tiny left pleural effusion. On the right there is persistent perihilar and basilar atelectasis and small pleural effusion. These findings have improved since yesterday's study. The heart and pulmonary vascularity are normal. The  right internal jugular venous catheter is been removed.  IMPRESSION: Improving aeration of the right lung. There is persistent basilar atelectasis and small effusion. There is minimal subsegmental atelectasis and trace of pleural fluid on the left.   Electronically Signed   By: David  Martinique M.D.   On: 03/26/2015 07:53   Discharge Medications:   Medication List    STOP taking these medications        ibuprofen 200 MG tablet  Commonly known as:  ADVIL,MOTRIN      TAKE these medications        alum & mag hydroxide-simeth 200-200-20 MG/5ML suspension  Commonly known as:  MAALOX/MYLANTA  Take 30 mLs by mouth every 6 (six) hours as needed for indigestion or heartburn.     amLODipine 10 MG tablet  Commonly known as:  NORVASC  Take 1 tablet (10 mg  total) by mouth daily.     aspirin 81 MG EC tablet  Take 81 mg by mouth daily.     citalopram 40 MG tablet  Commonly known as:  CELEXA  Take 1 tablet (40 mg total) by mouth daily.     cloNIDine 0.1 MG tablet  Commonly known as:  CATAPRES  Take 1 tablet by mouth every AM and 2 tablets by mouth every PM     clopidogrel 75 MG tablet  Commonly known as:  PLAVIX  Take 1 tablet (75 mg total) by mouth daily.     isosorbide mononitrate 30 MG 24 hr tablet  Commonly known as:  IMDUR  TAKE 1 TABLET (30 MG TOTAL) BY MOUTH DAILY.     loratadine 10 MG tablet  Commonly known as:  CLARITIN  Take 10 mg by mouth daily.     lovastatin 40 MG tablet  Commonly known as:  MEVACOR  Take 40 mg by mouth at bedtime.     nitroGLYCERIN 0.4 MG SL tablet  Commonly known as:  NITROSTAT  Place 1 tablet (0.4 mg total) under the tongue every 5 (five) minutes as needed for chest pain.     oxyCODONE 5 MG immediate release tablet  Commonly known as:  Oxy IR/ROXICODONE  Take 1-2 tablets (5-10 mg total) by mouth every 6 (six) hours as needed for severe pain.     pantoprazole 40 MG tablet  Commonly known as:  PROTONIX  Take 1 tablet (40 mg total) by mouth daily.     pramipexole 1 MG tablet  Commonly known as:  MIRAPEX  TAKE 1 TABLET (1 MG TOTAL) BY MOUTH 3 (THREE) TIMES DAILY.         Follow Up Appointments: Follow-up Information    Follow up with Melrose Nakayama, MD On 04/13/2015.   Specialty:  Cardiothoracic Surgery   Why:  PA/LAT CXR to be taken (at [facility] which is in the same building as Dr. Leonarda Salon office) on 04/13/2015 at 11:15 am;Appointment time is at 11:45 am   Contact information:   [place] [address] [phone]       Signed: Lars Pinks MPA-C 03/26/2015, 9:09 AM

## 2015-03-27 ENCOUNTER — Inpatient Hospital Stay (HOSPITAL_COMMUNITY): Payer: Medicare Other

## 2015-03-27 MED ORDER — OXYCODONE HCL 5 MG PO TABS: 5.0000 mg | ORAL_TABLET | Freq: Four times a day (QID) | ORAL | Status: DC | PRN

## 2015-03-27 NOTE — Progress Notes (Signed)
[address]       [place] [address]             [phone]      5 Days Post-Op Procedure(s) (LRB): RIGHT VIDEO ASSISTED THORACOSCOPY (Right) RIGHT LOWER LOBE SUPERIOR SEGMENTECTOMY (Right) LYMPH NODE DISSECTION (Right) Subjective: conts to look and feel better, off O2 , breathing is comfortable  Objective: Vital signs in last 24 hours: Temp:  [98.1 F (36.7 C)-98.4 F (36.9 C)] 98.1 F (36.7 C) (08/13 0549) Pulse Rate:  [52-65] 52 (08/13 0549) Cardiac Rhythm:  [-] Normal sinus rhythm (08/12 1945) Resp:  [17-19] 17 (08/13 0549) BP: (155-162)/(63-70) 159/64 mmHg (08/13 0549) SpO2:  [97 %-98 %] 97 % (08/13 0549)  Hemodynamic parameters for last 24 hours:    Intake/Output from previous day: 08/12 0701 - 08/13 0700 In: 720 [P.O.:720] Out: 1000 [Urine:1000] Intake/Output this shift:    General appearance: alert, cooperative and no distress Heart: regular rate and rhythm Lungs: mildly dim in bases, no wheeze Abdomen: benign exam Extremities: no edema Wound: incis healing well  Lab Results:  Recent Labs  03/25/15 0511  WBC 7.7  HGB 9.7*  HCT 29.5*  PLT 99*   BMET:  Recent Labs  03/25/15 0511  NA 137  K 4.1  CL 104  CO2 27  GLUCOSE 100*  BUN 14  CREATININE 1.39*  CALCIUM 8.7*    PT/INR: No results for input(s): LABPROT, INR in the last 72 hours. ABG    Component Value Date/Time   PHART 7.323* 03/23/2015 0506   HCO3 24.5* 03/23/2015 0506   TCO2 26.0 03/23/2015 0506   ACIDBASEDEF 0.7 03/23/2015 0506   O2SAT 93.6 03/23/2015 0506   CBG (last 3)  No results for input(s): GLUCAP in the last 72 hours.  Meds Scheduled Meds: . amLODipine  10 mg Oral Daily  . antiseptic oral rinse  7 mL Mouth Rinse q12n4p  . aspirin EC  81 mg Oral Daily  . bisacodyl  10 mg Oral Daily  . citalopram  40 mg Oral Daily  . cloNIDine  0.1 mg Oral Daily  . cloNIDine  0.2 mg Oral QHS  . clopidogrel  75 mg Oral Daily  . guaiFENesin  1,200 mg Oral  BID  . isosorbide mononitrate  30 mg Oral Daily  . loratadine  10 mg Oral Daily  . mupirocin ointment  1 application Nasal BID  . pantoprazole  40 mg Oral Daily  . pramipexole  1 mg Oral TID  . senna-docusate  1 tablet Oral QHS   Continuous Infusions:  PRN Meds:.hydrALAZINE, levalbuterol, naloxone **AND** sodium chloride, ondansetron (ZOFRAN) IV, oxyCODONE, potassium chloride, potassium chloride, traMADol  Xrays Dg Chest 2 View  03/26/2015   CLINICAL DATA:  Status toast partial right segmentectomy and lymph node dissection  EXAM: CHEST  2 VIEW  COMPARISON:  Portable chest x-ray of March 25, 2015  FINDINGS: The left lung is well-expanded. There is minimal left basilar atelectasis and tiny left pleural effusion. On the right there is persistent perihilar and basilar atelectasis and small pleural effusion. These findings have improved since yesterday's study. The heart and pulmonary vascularity are normal. The right internal jugular venous catheter is been removed.  IMPRESSION: Improving aeration of the right lung. There is persistent basilar atelectasis and small effusion. There is minimal subsegmental atelectasis and trace of pleural fluid on the left.   Electronically Signed   By: David  Martinique M.D.   On: 03/26/2015 07:53  Assessment/Plan: S/P Procedure(s) (LRB): RIGHT VIDEO ASSISTED THORACOSCOPY (Right) RIGHT LOWER LOBE SUPERIOR SEGMENTECTOMY (Right) LYMPH NODE DISSECTION (Right) Plan for discharge: see discharge orders   LOS: 5 days    GOLD,WAYNE E 03/27/2015

## 2015-04-04 ENCOUNTER — Other Ambulatory Visit: Payer: Self-pay | Admitting: Nurse Practitioner

## 2015-04-05 ENCOUNTER — Ambulatory Visit (INDEPENDENT_AMBULATORY_CARE_PROVIDER_SITE_OTHER): Payer: Medicare Other | Admitting: Cardiovascular Disease

## 2015-04-05 ENCOUNTER — Other Ambulatory Visit: Payer: Self-pay | Admitting: Internal Medicine

## 2015-04-05 ENCOUNTER — Encounter: Payer: Self-pay | Admitting: Cardiovascular Disease

## 2015-04-05 VITALS — BP 184/106 | HR 72 | Ht 68.0 in | Wt 144.8 lb

## 2015-04-05 DIAGNOSIS — I6523 Occlusion and stenosis of bilateral carotid arteries: Secondary | ICD-10-CM

## 2015-04-05 DIAGNOSIS — E785 Hyperlipidemia, unspecified: Secondary | ICD-10-CM

## 2015-04-05 DIAGNOSIS — I10 Essential (primary) hypertension: Secondary | ICD-10-CM | POA: Diagnosis not present

## 2015-04-05 DIAGNOSIS — I2510 Atherosclerotic heart disease of native coronary artery without angina pectoris: Secondary | ICD-10-CM | POA: Diagnosis not present

## 2015-04-05 NOTE — Progress Notes (Signed)
Chief Complaint  Patient presents with  . Follow-up     History of Present Illness: 74 yo male with history of CAD, CVA, HTN, HLD, PAD, prostate CA who is here today for cardiac follow up. He was seen as a new patient 02/06/12 for evaluation of abnormal chest CT. His chest CT showed calcification of the aorta and coronary arteries. He described occasional chest pains that occurred at rest and with exertion. LHC 02/12/12 showed severe LAD and Circumflex disease. PCI delayed because of renal insufficiency and while he was having a renal cyst workup. He was brought back on 03/08/12 and I placed 2 DES in the LAD and 2 DES in the Circumflex. He did well following the PCI. Admitted to [facility] August 2016 for resection of right lung adenocarcinoma. Carotid dopplers with 40-59% bilateral stenosis April 2016.   He is here today for follow up. No exertional chest pains. His breathing is at baseline. He is feeling well overall. His wife is here and she is undergoing chemo/radiation for breast cancer.   Primary Care Physician: Cathlean Cower  Last Lipid Profile:Lipid Panel     Component Value Date/Time   CHOL 133 10/20/2014 1013   TRIG 77.0 10/20/2014 1013   HDL 35.80* 10/20/2014 1013   CHOLHDL 4 10/20/2014 1013   VLDL 15.4 10/20/2014 1013   LDLCALC 82 10/20/2014 1013    Past Medical History  Diagnosis Date  . BRADYCARDIA 10/24/2010  . CALF PAIN, RIGHT 10/22/2009  . 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 Dysuria 10/10/2010  . ERECTILE DYSFUNCTION 04/05/2007  . FATIGUE 10/02/2007  . GLUCOSE INTOLERANCE 07/15/2008  . HYPERLIPIDEMIA 07/30/2008  . HYPERSOMNIA 10/02/2007  . INSOMNIA-SLEEP DISORDER-UNSPEC 10/22/2009  . LOW BACK PAIN 04/05/2007  . OSTEOARTHRITIS 04/05/2007  . PEPTIC ULCER DISEASE 04/05/2007  . PERIPHERAL EDEMA 10/22/2009  . PERIPHERAL VASCULAR DISEASE 07/30/2008  . PROSTATE CANCER, HX OF 04/05/2007  . RESTLESS LEG SYNDROME 05/15/2007  . RHINITIS, ALLERGIC NOS 05/15/2007  . SHOULDER PAIN, LEFT  01/22/2008  . SINUSITIS- ACUTE-NOS 05/15/2007  . Unspecified essential hypertension 04/05/2007  . Unspecified visual loss 07/15/2008  . UTI 10/24/2010  . Impaired glucose tolerance 02/09/2011  . Coronary artery disease      95% followed by 80% mid LAD stenosis, circumflex 80% stenosis. There was a moderate size branching obtuse marginal with 90-95% stenosis in the inferior branch. The right coronary artery had proximal 30% stenosis. Posterior lateral was moderate-sized with 40% stenosis. The EF was 65-70%. He had a DES and  x 2 to the LAD and DES x 2 to the circumflex 03/08/12.    [name] GERD (gastroesophageal reflux disease)   . Emphysema 04/01/2012    By CT chest, august 2013  . CKD (chronic kidney disease), stage II     Past Surgical History  Procedure Laterality Date  . Inguinal herniorrhapy left  2002    x 2  . Rotator cuff repair      left  . Knee surgery      left  . S/p ventral surgery  2009  . Prostatectomy  2002    radical  . Right hip replacement  09/22/09  . Left hip replacement    . Eye surgery      right  . Percutaneous coronary stent intervention (pci-s) N/A 03/08/2012    Procedure: PERCUTANEOUS CORONARY STENT INTERVENTION (PCI-S);  Surgeon: Burnell Blanks, MD;  Location[facility];  Service: Cardiovascular;  Laterality: N/A;  . Video assisted thoracoscopy Right 03/22/2015  Procedure: RIGHT VIDEO ASSISTED THORACOSCOPY;  Surgeon: Melrose Nakayama, MD;  Location: [place];  Service: Thoracic;  Laterality: Right;  . Segmentecomy Right 03/22/2015    Procedure: RIGHT LOWER LOBE SUPERIOR SEGMENTECTOMY;  Surgeon: Melrose Nakayama, MD;  Location: [place];  Service: Thoracic;  Laterality: Right;  . Lymph node dissection Right 03/22/2015    Procedure: LYMPH NODE DISSECTION;  Surgeon: Melrose Nakayama, MD;  Location: [place];  Service: Thoracic;  Laterality: Right;    Current Outpatient Prescriptions  Medication Sig Dispense Refill  . alum & mag hydroxide-simeth  (MAALOX/MYLANTA) 200-200-20 MG/5ML suspension Take 30 mLs by mouth every 6 (six) hours as needed for indigestion or heartburn.    [name] amLODipine (NORVASC) 10 MG tablet Take 1 tablet (10 mg total) by mouth daily. 90 tablet 3  . aspirin 81 MG EC tablet Take 81 mg by mouth daily.      . citalopram (CELEXA) 40 MG tablet Take 1 tablet (40 mg total) by mouth daily. 90 tablet 3  . cloNIDine (CATAPRES) 0.1 MG tablet Take 1 tablet by mouth every AM and 2 tablets by mouth every PM 270 tablet 3  . clopidogrel (PLAVIX) 75 MG tablet Take 1 tablet (75 mg total) by mouth daily. 90 tablet 1  . isosorbide mononitrate (IMDUR) 30 MG 24 hr tablet TAKE 1 TABLET (30 MG TOTAL) BY MOUTH DAILY. 30 tablet 5  . loratadine (CLARITIN) 10 MG tablet Take 10 mg by mouth daily.    [name] lovastatin (MEVACOR) 40 MG tablet Take 40 mg by mouth at bedtime.  9  . nitroGLYCERIN (NITROSTAT) 0.4 MG SL tablet Place 1 tablet (0.4 mg total) under the tongue every 5 (five) minutes as needed for chest pain. 25 tablet 6  . oxyCODONE (OXY IR/ROXICODONE) 5 MG immediate release tablet Take 1-2 tablets (5-10 mg total) by mouth every 6 (six) hours as needed for severe pain. 50 tablet 0  . pantoprazole (PROTONIX) 40 MG tablet Take 1 tablet (40 mg total) by mouth daily. 30 tablet 11  . pramipexole (MIRAPEX) 1 MG tablet TAKE 1 TABLET (1 MG TOTAL) BY MOUTH 3 (THREE) TIMES DAILY. 90 tablet 5   No current facility-administered medications for this visit.    Allergies  Allergen Reactions  . Amitriptyline Hcl Other (See Comments)    REACTION: confusion  . Diphenhydramine Hcl Other (See Comments)    Keeps him awake  . Simvastatin Other (See Comments)    REACTION: leg pain  . Tylenol [Acetaminophen] Other (See Comments)    Dr advised pt not to take due to finding a spot on pt's kidney    Social History   Social History  . Marital Status: Married    Spouse Name: N/A  . Number of Children: 2  . Years of Education: N/A   Occupational History  .  truck driver-retired    Social History Main Topics  . Smoking status: Former Smoker -- 1.00 packs/day for 15 years    Types: Cigarettes    Quit date: 02/05/1977  . Smokeless tobacco: Never Used  . Alcohol Use: 0.5 oz/week    1 Standard drinks or equivalent per week     Comment: RARE  . Drug Use: No  . Sexual Activity: Not Currently   Other Topics Concern  . Not on file   Social History Narrative    Family History  Problem Relation Age of Onset  . Heart attack Father 42    Smoker  . Heart attack Brother 7  .  Colon cancer Neg Hx   . Lung cancer Sister     Review of Systems:  As stated in the HPI and otherwise negative.   BP 184/106 mmHg  Pulse 72  Ht '5\' 8"'$  (1.727 m)  Wt 144 lb 12.8 oz (65.681 kg)  BMI 22.02 kg/m2  SpO2 97%  Physical Examination: General: Well developed, well nourished, NAD HEENT: OP clear, mucus membranes moist SKIN: warm, dry. No rashes. Neuro: No focal deficits Musculoskeletal: Muscle strength 5/5 all ext Psychiatric: Mood and affect normal Neck: No JVD, no carotid bruits, no thyromegaly, no lymphadenopathy. Lungs:Clear bilaterally, no wheezes, rhonci, crackles Cardiovascular: Regular rate and rhythm. No murmurs, gallops or rubs. Abdomen:Soft. Bowel sounds present. Non-tender.  Extremities: No lower extremity edema. Pulses are 2 + in the bilateral DP/PT.  EKG:  EKG is not ordered today. The ekg ordered today demonstrates   Recent Labs: 10/20/2014: TSH 3.79 03/24/2015: ALT 9* 03/25/2015: BUN 14; Creatinine, Ser 1.39*; Hemoglobin 9.7*; Platelets 99*; Potassium 4.1; Sodium 137   Lipid Panel    Component Value Date/Time   CHOL 133 10/20/2014 1013   TRIG 77.0 10/20/2014 1013   HDL 35.80* 10/20/2014 1013   CHOLHDL 4 10/20/2014 1013   VLDL 15.4 10/20/2014 1013   LDLCALC 82 10/20/2014 1013   LDLDIRECT 104.9 02/10/2011 1224     Wt Readings from Last 3 Encounters:  04/05/15 144 lb 12.8 oz (65.681 kg)  03/24/15 153 lb 3.5 oz (69.5 kg)    03/19/15 149 lb 8 oz (67.813 kg)     Other studies Reviewed: Additional studies/ records that were reviewed today include: . Review of the above records demonstrates:   Assessment and Plan:   1. CAD: Stable. Continue ASA and Plavix. No beta blocker with braydcardia.   2. HTN: BP is well controlled at home. He has not taken his medication yet today. Continue current meds.   3. HLD: Continue statin. Lipids well controlled.   4. Carotid artery disease: Bilateral 40-59% stenosis by dopplers April 2016. Repeat one year.   Current medicines are reviewed at length with the patient today.  The patient does not have concerns regarding medicines.  The following changes have been made:  no change  Labs/ tests ordered today include:  No orders of the defined types were placed in this encounter.     Disposition:   FU with me in 6 months   Signed, Lauree Chandler, MD 04/05/2015 8:36 AM    [facility] [address], [place], [place]  [address] Phone: [phone]; Fax: [phone]

## 2015-04-05 NOTE — Patient Instructions (Signed)
Medication Instructions:  Your physician recommends that you continue on your current medications as directed. Please refer to the Current Medication list given to you today.   Labwork: none  Testing/Procedures: none  Follow-Up: Your physician wants you to follow-up in: 6 months.  You will receive a reminder letter in the mail two months in advance. If you don't receive a letter, please call our office to schedule the follow-up appointment.       

## 2015-04-11 ENCOUNTER — Other Ambulatory Visit: Payer: Self-pay | Admitting: Internal Medicine

## 2015-04-12 ENCOUNTER — Other Ambulatory Visit: Payer: Self-pay | Admitting: Thoracic Surgery (Cardiothoracic Vascular Surgery)

## 2015-04-12 DIAGNOSIS — J439 Emphysema, unspecified: Secondary | ICD-10-CM

## 2015-04-13 ENCOUNTER — Ambulatory Visit (INDEPENDENT_AMBULATORY_CARE_PROVIDER_SITE_OTHER): Payer: Self-pay | Admitting: Thoracic Surgery (Cardiothoracic Vascular Surgery)

## 2015-04-13 ENCOUNTER — Ambulatory Visit
Admission: RE | Admit: 2015-04-13 | Discharge: 2015-04-13 | Disposition: A | Discharge status: Home / Self Care | Payer: Medicare Other | Source: Ambulatory Visit | Attending: Thoracic Surgery (Cardiothoracic Vascular Surgery) | Admitting: Thoracic Surgery (Cardiothoracic Vascular Surgery)

## 2015-04-13 ENCOUNTER — Encounter: Payer: Self-pay | Admitting: Thoracic Surgery (Cardiothoracic Vascular Surgery)

## 2015-04-13 VITALS — BP 122/69 | HR 80 | Resp 20 | Ht 68.0 in | Wt 144.0 lb

## 2015-04-13 DIAGNOSIS — J439 Emphysema, unspecified: Secondary | ICD-10-CM

## 2015-04-13 DIAGNOSIS — R0602 Shortness of breath: Secondary | ICD-10-CM | POA: Diagnosis not present

## 2015-04-13 DIAGNOSIS — C3491 Malignant neoplasm of unspecified part of right bronchus or lung: Secondary | ICD-10-CM

## 2015-04-13 DIAGNOSIS — Z9889 Other specified postprocedural states: Secondary | ICD-10-CM

## 2015-04-13 DIAGNOSIS — Z902 Acquired absence of lung [part of]: Secondary | ICD-10-CM

## 2015-04-13 NOTE — Progress Notes (Signed)
[address]       [place] [address]             [phone]        HPI:  Victor Davis returns today for scheduled postoperative follow-up visit.  He is a 74 year old man who had a thoracoscopic right lower lobe superior segmentectomy and node sampling on 03/22/2015. 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 We used an On-Q catheter for pain control postoperatively.  He says that he has some incisional pain particularly when he takes a deep breath. He is able to walk and he is able to drive. He has been taking oxycodone for pain occasionally, but does not like to take it to often because it causes constipation. He has been driving without any issues.  Past Medical History  Diagnosis Date  . BRADYCARDIA 10/24/2010  . CALF PAIN, RIGHT 10/22/2009  . 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 Dysuria 10/10/2010  . ERECTILE DYSFUNCTION 04/05/2007  . FATIGUE 10/02/2007  . GLUCOSE INTOLERANCE 07/15/2008  . HYPERLIPIDEMIA 07/30/2008  . HYPERSOMNIA 10/02/2007  . INSOMNIA-SLEEP DISORDER-UNSPEC 10/22/2009  . LOW BACK PAIN 04/05/2007  . OSTEOARTHRITIS 04/05/2007  . PEPTIC ULCER DISEASE 04/05/2007  . PERIPHERAL EDEMA 10/22/2009  . PERIPHERAL VASCULAR DISEASE 07/30/2008  . PROSTATE CANCER, HX OF 04/05/2007  . RESTLESS LEG SYNDROME 05/15/2007  . RHINITIS, ALLERGIC NOS 05/15/2007  . SHOULDER PAIN, LEFT 01/22/2008  . SINUSITIS- ACUTE-NOS 05/15/2007  . Unspecified essential hypertension 04/05/2007  . Unspecified visual loss 07/15/2008  . UTI 10/24/2010  . Impaired glucose tolerance 02/09/2011  . Coronary artery disease      95% followed by 80% mid LAD stenosis, circumflex 80% stenosis. There was a moderate size branching obtuse marginal with 90-95% stenosis in the inferior branch. The right coronary artery had proximal 30% stenosis. Posterior lateral was moderate-sized with 40% stenosis. The EF was 65-70%. He had a DES and  x 2 to  the LAD and DES x 2 to the circumflex 03/08/12.    [name] GERD (gastroesophageal reflux disease)   . Emphysema 04/01/2012    By CT chest, august 2013  . CKD (chronic kidney disease), stage II       Current Outpatient Prescriptions  Medication Sig Dispense Refill  . alum & mag hydroxide-simeth (MAALOX/MYLANTA) 200-200-20 MG/5ML suspension Take 30 mLs by mouth every 6 (six) hours as needed for indigestion or heartburn.    [name] amLODipine (NORVASC) 10 MG tablet Take 1 tablet (10 mg total) by mouth daily. 90 tablet 3  . aspirin 81 MG EC tablet Take 81 mg by mouth daily.      . citalopram (CELEXA) 40 MG tablet Take 1 tablet (40 mg total) by mouth daily. 90 tablet 3  . cloNIDine (CATAPRES) 0.1 MG tablet Take 1 tablet by mouth every AM and 2 tablets by mouth every PM 270 tablet 3  . clopidogrel (PLAVIX) 75 MG tablet TAKE 1 TABLET EVERY DAY 30 tablet 5  . isosorbide mononitrate (IMDUR) 30 MG 24 hr tablet TAKE 1 TABLET (30 MG TOTAL) BY MOUTH DAILY. 30 tablet 5  . loratadine (CLARITIN) 10 MG tablet Take 10 mg by mouth daily.    [name] lovastatin (MEVACOR) 40 MG tablet Take 40 mg by mouth at bedtime.  9  . nitroGLYCERIN (NITROSTAT) 0.4 MG SL tablet Place 1 tablet (0.4 mg total) under the tongue every 5 (five) minutes as needed for chest pain. 25 tablet 6  .  oxyCODONE (OXY IR/ROXICODONE) 5 MG immediate release tablet Take 1-2 tablets (5-10 mg total) by mouth every 6 (six) hours as needed for severe pain. 50 tablet 0  . pantoprazole (PROTONIX) 40 MG tablet TAKE 1 TABLET EVERY DAY 30 tablet 0  . pramipexole (MIRAPEX) 1 MG tablet TAKE 1 TABLET (1 MG TOTAL) BY MOUTH 3 (THREE) TIMES DAILY. 90 tablet 5   No current facility-administered medications for this visit.    Physical Exam BP 122/69 mmHg  Pulse 80  Resp 20  Ht '5\' 8"'$  (1.727 m)  Wt 144 lb (65.318 kg)  BMI 21.90 kg/m2  SpO3 58% 74 year old man in no acute distress Alert and oriented 3 with no focal deficits Incisions healing well Lungs with diminished  breath sounds right base Cardiac regular rate and rhythm normal S1 and S2  Diagnostic Tests:  I personally reviewed his chest x-ray. It shows postoperative changes with a small right pleural effusion.  Impression: Victor Davis is a 74 year old former smoker who had a stage IA non-small cell carcinoma resected with a right lower lobe superior segmentectomy about 3 weeks ago. Overall I think he is doing well. It is not unusual to still have some incisional discomfort this early after the operation. His breathing should improve as his pain improves since we really did not take out much lung parenchyma with a segmentectomy.  I think he would benefit from pulmonary rehabilitation and will make that referral.  I'm going to refer him to Dr. Julien Nordmann at our multidisciplinary thoracic oncology clinic. He will also be able to see the ancillary personnel there.  Plan: Referral to Dr. Julien Nordmann  Pulmonary rehabilitation  I will see him back in 2 months with a PA and lateral chest x-ray to check on his progress.  Melrose Nakayama, MD [facility] [phone]

## 2015-04-14 ENCOUNTER — Telehealth: Payer: Self-pay | Admitting: *Deleted

## 2015-04-14 DIAGNOSIS — R911 Solitary pulmonary nodule: Secondary | ICD-10-CM

## 2015-04-14 NOTE — Telephone Encounter (Signed)
Oncology Nurse Navigator Documentation  Oncology Nurse Navigator Flowsheets 04/14/2015  Referral date to RadOnc/MedOnc 04/14/2015  Navigator Encounter Type Introductory phone call/Recieved referral from Dr. Leonarda Salon office.  I called and scheduled patient to be seen on 04/29/15 arrive at 12:30.  Patient verbalized understanding of appt time and place.   Treatment Phase Treatment  Interventions Coordination of Care  Coordination of Care MD Appointments  Time Spent with Patient 15

## 2015-04-21 DIAGNOSIS — K810 Acute cholecystitis: Secondary | ICD-10-CM | POA: Diagnosis not present

## 2015-04-22 ENCOUNTER — Other Ambulatory Visit: Payer: Medicare Other

## 2015-04-22 ENCOUNTER — Ambulatory Visit (INDEPENDENT_AMBULATORY_CARE_PROVIDER_SITE_OTHER): Payer: Medicare Other | Admitting: Internal Medicine

## 2015-04-22 ENCOUNTER — Encounter: Payer: Self-pay | Admitting: Internal Medicine

## 2015-04-22 VITALS — BP 110/60 | HR 68 | Temp 97.7°F | Ht 68.0 in | Wt 144.0 lb

## 2015-04-22 DIAGNOSIS — E785 Hyperlipidemia, unspecified: Secondary | ICD-10-CM | POA: Diagnosis not present

## 2015-04-22 DIAGNOSIS — R7302 Impaired glucose tolerance (oral): Secondary | ICD-10-CM | POA: Diagnosis not present

## 2015-04-22 DIAGNOSIS — I10 Essential (primary) hypertension: Secondary | ICD-10-CM

## 2015-04-22 DIAGNOSIS — Z23 Encounter for immunization: Secondary | ICD-10-CM | POA: Diagnosis not present

## 2015-04-22 NOTE — Assessment & Plan Note (Signed)
stable overall by history and exam, recent data reviewed with pt, and pt to continue medical treatment as before,  to f/u any worsening symptoms or concerns BP Readings from Last 3 Encounters:  04/22/15 110/60  04/13/15 122/69  04/05/15 184/106

## 2015-04-22 NOTE — Progress Notes (Signed)
Subjective:    Patient ID: Victor Davis, male    DOB: [birth year], 74 y.o.   MRN: [id number]  HPI   Here to f/u, has recent new onset dx RLL adenoacarcinoma s/p surgury, for f/u oncology next wk  Has some right chest pain residual postop but improving. Now also on lupron for prostate ca. Pt denies chest pain, increased sob or doe, wheezing, orthopnea, PND, increased LE swelling, palpitations, dizziness or syncope. Pt denies new neurological symptoms such as new headache, or facial or extremity weakness or numbness   Pt denies new neurological symptoms such as new headache, or facial or extremity weakness or numbness   Pt denies polydipsia, polyuria, Past Medical History  Diagnosis Date  . BRADYCARDIA 10/24/2010  . CALF PAIN, RIGHT 10/22/2009  . 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 Dysuria 10/10/2010  . ERECTILE DYSFUNCTION 04/05/2007  . FATIGUE 10/02/2007  . GLUCOSE INTOLERANCE 07/15/2008  . HYPERLIPIDEMIA 07/30/2008  . HYPERSOMNIA 10/02/2007  . INSOMNIA-SLEEP DISORDER-UNSPEC 10/22/2009  . LOW BACK PAIN 04/05/2007  . OSTEOARTHRITIS 04/05/2007  . PEPTIC ULCER DISEASE 04/05/2007  . PERIPHERAL EDEMA 10/22/2009  . PERIPHERAL VASCULAR DISEASE 07/30/2008  . PROSTATE CANCER, HX OF 04/05/2007  . RESTLESS LEG SYNDROME 05/15/2007  . RHINITIS, ALLERGIC NOS 05/15/2007  . SHOULDER PAIN, LEFT 01/22/2008  . SINUSITIS- ACUTE-NOS 05/15/2007  . Unspecified essential hypertension 04/05/2007  . Unspecified visual loss 07/15/2008  . UTI 10/24/2010  . Impaired glucose tolerance 02/09/2011  . Coronary artery disease      95% followed by 80% mid LAD stenosis, circumflex 80% stenosis. There was a moderate size branching obtuse marginal with 90-95% stenosis in the inferior branch. The right coronary artery had proximal 30% stenosis. Posterior lateral was moderate-sized with 40% stenosis. The EF was 65-70%. He had a DES and  x 2 to the LAD and DES x 2 to the circumflex 03/08/12.    [name] GERD (gastroesophageal reflux disease)   .  Emphysema 04/01/2012    By CT chest, august 2013  . CKD (chronic kidney disease), stage II    Past Surgical History  Procedure Laterality Date  . Inguinal herniorrhapy left  2002    x 2  . Rotator cuff repair      left  . Knee surgery      left  . S/p ventral surgery  2009  . Prostatectomy  2002    radical  . Right hip replacement  09/22/09  . Left hip replacement    . Eye surgery      right  . Percutaneous coronary stent intervention (pci-s) N/A 03/08/2012    Procedure: PERCUTANEOUS CORONARY STENT INTERVENTION (PCI-S);  Surgeon: Burnell Blanks, MD;  Location: [facility] CATH LAB;  Service: Cardiovascular;  Laterality: N/A;  . Video assisted thoracoscopy Right 03/22/2015    Procedure: RIGHT VIDEO ASSISTED THORACOSCOPY;  Surgeon: Melrose Nakayama, MD;  Location: [place];  Service: Thoracic;  Laterality: Right;  . Segmentecomy Right 03/22/2015    Procedure: RIGHT LOWER LOBE SUPERIOR SEGMENTECTOMY;  Surgeon: Melrose Nakayama, MD;  Location: [place];  Service: Thoracic;  Laterality: Right;  . Lymph node dissection Right 03/22/2015    Procedure: LYMPH NODE DISSECTION;  Surgeon: Melrose Nakayama, MD;  Location: [place];  Service: Thoracic;  Laterality: Right;    reports that he quit smoking about 38 years ago. His smoking use included Cigarettes. He has a 15 pack-year smoking history. He has never used smokeless tobacco. He reports that he drinks about 0.5 oz of  alcohol per week. He reports that he does not use illicit drugs. family history includes Heart attack (age of onset: 64) in his father; Heart attack (age of onset: 59) in his brother; Lung cancer in his sister. There is no history of Colon cancer. Allergies  Allergen Reactions  . Amitriptyline Hcl Other (See Comments)    REACTION: confusion  . Diphenhydramine Hcl Other (See Comments)    Keeps him awake  . Simvastatin Other (See Comments)    REACTION: leg pain  . Tylenol [Acetaminophen] Other (See Comments)    Dr advised pt not to  take due to finding a spot on pt's kidney   Current Outpatient Prescriptions on File Prior to Visit  Medication Sig Dispense Refill  . alum & mag hydroxide-simeth (MAALOX/MYLANTA) 200-200-20 MG/5ML suspension Take 30 mLs by mouth every 6 (six) hours as needed for indigestion or heartburn.    [name] amLODipine (NORVASC) 10 MG tablet Take 1 tablet (10 mg total) by mouth daily. 90 tablet 3  . aspirin 81 MG EC tablet Take 81 mg by mouth daily.      . citalopram (CELEXA) 40 MG tablet Take 1 tablet (40 mg total) by mouth daily. 90 tablet 3  . cloNIDine (CATAPRES) 0.1 MG tablet Take 1 tablet by mouth every AM and 2 tablets by mouth every PM 270 tablet 3  . clopidogrel (PLAVIX) 75 MG tablet TAKE 1 TABLET EVERY DAY 30 tablet 5  . isosorbide mononitrate (IMDUR) 30 MG 24 hr tablet TAKE 1 TABLET (30 MG TOTAL) BY MOUTH DAILY. 30 tablet 5  . loratadine (CLARITIN) 10 MG tablet Take 10 mg by mouth daily.    [name] lovastatin (MEVACOR) 40 MG tablet Take 40 mg by mouth at bedtime.  9  . nitroGLYCERIN (NITROSTAT) 0.4 MG SL tablet Place 1 tablet (0.4 mg total) under the tongue every 5 (five) minutes as needed for chest pain. 25 tablet 6  . oxyCODONE (OXY IR/ROXICODONE) 5 MG immediate release tablet Take 1-2 tablets (5-10 mg total) by mouth every 6 (six) hours as needed for severe pain. 50 tablet 0  . pantoprazole (PROTONIX) 40 MG tablet TAKE 1 TABLET EVERY DAY 30 tablet 0  . pramipexole (MIRAPEX) 1 MG tablet TAKE 1 TABLET (1 MG TOTAL) BY MOUTH 3 (THREE) TIMES DAILY. 90 tablet 5   No current facility-administered medications on file prior to visit.    Review of Systems  Constitutional: Negative for unusual diaphoresis or night sweats HENT: Negative for ringing in ear or discharge Eyes: Negative for double vision or worsening visual disturbance.  Respiratory: Negative for choking and stridor.   Gastrointestinal: Negative for vomiting or other signifcant bowel change Genitourinary: Negative for hematuria or change in  urine volume.  Musculoskeletal: Negative for other MSK pain or swelling Skin: Negative for color change and worsening wound.  Neurological: Negative for tremors and numbness other than noted  Psychiatric/Behavioral: Negative for decreased concentration or agitation other than above       Objective:   Physical Exam BP 110/60 mmHg  Pulse 68  Temp(Src) 97.7 F (36.5 C) (Oral)  Ht '5\' 8"'$  (1.727 m)  Wt 144 lb (65.318 kg)  BMI 21.90 kg/m2  SpO2 97% VS noted,  Constitutional: Pt appears in no significant distress HENT: Head: NCAT.  Right Ear: External ear normal.  Left Ear: External ear normal.  Eyes: . Pupils are equal, round, and reactive to light. Conjunctivae and EOM are normal Neck: Normal range of motion. Neck supple.  Cardiovascular: Normal rate  and regular rhythm.   Pulmonary/Chest: Effort normal and breath sounds without rales or wheezing.  Abd:  Soft, NT, ND, + BS Neurological: Pt is alert. Not confused , motor grossly intact Skin: Skin is warm. No rash, no LE edema Psychiatric: Pt behavior is normal. No agitation.     Assessment & Plan:

## 2015-04-22 NOTE — Assessment & Plan Note (Signed)
stable overall by history and exam, recent data reviewed with pt, and pt to continue medical treatment as before,  to f/u any worsening symptoms or concerns Lab Results  Component Value Date   HGBA1C 5.8 04/21/2014

## 2015-04-22 NOTE — Patient Instructions (Addendum)
You had the flu shot today  Please continue all other medications as before, and refills have been done if requested.  Please have the pharmacy call with any other refills you may need.  Please continue your efforts at being more active, low cholesterol diet, and weight control.  You are otherwise up to date with prevention measures today.  Please keep your appointments with your specialists as you may have planned  Please return in 6 months, or sooner if needed 

## 2015-04-22 NOTE — Progress Notes (Signed)
Pre visit review using our clinic review tool, if applicable. No additional management support is needed unless otherwise documented below in the visit note. 

## 2015-04-22 NOTE — Assessment & Plan Note (Signed)
stable overall by history and exam, recent data reviewed with pt, and pt to continue medical treatment as before,  to f/u any worsening symptoms or concerns Lab Results  Component Value Date   LDLCALC 82 10/20/2014

## 2015-04-28 ENCOUNTER — Telehealth: Payer: Self-pay | Admitting: *Deleted

## 2015-04-28 NOTE — Telephone Encounter (Signed)
Left a message on pt's voicemail w/ friendly reminder of clinic appt tomorrow 9/15.

## 2015-04-29 ENCOUNTER — Telehealth: Payer: Self-pay | Admitting: Internal Medicine

## 2015-04-29 ENCOUNTER — Other Ambulatory Visit (HOSPITAL_BASED_OUTPATIENT_CLINIC_OR_DEPARTMENT_OTHER): Payer: Medicare Other

## 2015-04-29 ENCOUNTER — Encounter: Payer: Self-pay | Admitting: *Deleted

## 2015-04-29 ENCOUNTER — Ambulatory Visit: Payer: Medicare Other | Attending: Internal Medicine | Admitting: Physical Therapy

## 2015-04-29 ENCOUNTER — Encounter: Payer: Self-pay | Admitting: Internal Medicine

## 2015-04-29 ENCOUNTER — Ambulatory Visit (HOSPITAL_BASED_OUTPATIENT_CLINIC_OR_DEPARTMENT_OTHER): Payer: Medicare Other | Admitting: Internal Medicine

## 2015-04-29 VITALS — BP 170/78 | HR 56 | Temp 97.6°F | Resp 18 | Ht 68.0 in | Wt 144.3 lb

## 2015-04-29 DIAGNOSIS — C3431 Malignant neoplasm of lower lobe, right bronchus or lung: Secondary | ICD-10-CM

## 2015-04-29 DIAGNOSIS — L905 Scar conditions and fibrosis of skin: Secondary | ICD-10-CM | POA: Diagnosis not present

## 2015-04-29 DIAGNOSIS — R0602 Shortness of breath: Secondary | ICD-10-CM

## 2015-04-29 DIAGNOSIS — R5381 Other malaise: Secondary | ICD-10-CM | POA: Diagnosis not present

## 2015-04-29 DIAGNOSIS — N289 Disorder of kidney and ureter, unspecified: Secondary | ICD-10-CM

## 2015-04-29 DIAGNOSIS — R911 Solitary pulmonary nodule: Secondary | ICD-10-CM

## 2015-04-29 DIAGNOSIS — D509 Iron deficiency anemia, unspecified: Secondary | ICD-10-CM | POA: Diagnosis not present

## 2015-04-29 DIAGNOSIS — R52 Pain, unspecified: Secondary | ICD-10-CM

## 2015-04-29 DIAGNOSIS — C3491 Malignant neoplasm of unspecified part of right bronchus or lung: Secondary | ICD-10-CM | POA: Insufficient documentation

## 2015-04-29 LAB — CBC WITH DIFFERENTIAL/PLATELET
BASO%: 0.5 % (ref 0.0–2.0)
BASOS ABS: 0 10*3/uL (ref 0.0–0.1)
EOS ABS: 0.5 10*3/uL (ref 0.0–0.5)
EOS%: 8.8 % — AB (ref 0.0–7.0)
HCT: 32.6 % — ABNORMAL LOW (ref 38.4–49.9)
HGB: 10.7 g/dL — ABNORMAL LOW (ref 13.0–17.1)
LYMPH%: 30.9 % (ref 14.0–49.0)
MCH: 28.2 pg (ref 27.2–33.4)
MCHC: 32.8 g/dL (ref 32.0–36.0)
MCV: 86 fL (ref 79.3–98.0)
MONO#: 0.7 10*3/uL (ref 0.1–0.9)
MONO%: 11.6 % (ref 0.0–14.0)
NEUT#: 2.8 10*3/uL (ref 1.5–6.5)
NEUT%: 48.2 % (ref 39.0–75.0)
Platelets: 154 10*3/uL (ref 140–400)
RBC: 3.79 10*6/uL — AB (ref 4.20–5.82)
RDW: 13.5 % (ref 11.0–14.6)
WBC: 5.9 10*3/uL (ref 4.0–10.3)
lymph#: 1.8 10*3/uL (ref 0.9–3.3)

## 2015-04-29 LAB — COMPREHENSIVE METABOLIC PANEL (CC13)
ALT: 7 U/L (ref 0–55)
AST: 12 U/L (ref 5–34)
Albumin: 3.4 g/dL — ABNORMAL LOW (ref 3.5–5.0)
Alkaline Phosphatase: 77 U/L (ref 40–150)
Anion Gap: 6 mEq/L (ref 3–11)
BUN: 25.2 mg/dL (ref 7.0–26.0)
CHLORIDE: 112 meq/L — AB (ref 98–109)
CO2: 25 mEq/L (ref 22–29)
Calcium: 9.4 mg/dL (ref 8.4–10.4)
Creatinine: 1.5 mg/dL — ABNORMAL HIGH (ref 0.7–1.3)
EGFR: 44 mL/min/{1.73_m2} — ABNORMAL LOW (ref >90)
GLUCOSE: 99 mg/dL (ref 70–140)
POTASSIUM: 4.2 meq/L (ref 3.5–5.1)
SODIUM: 143 meq/L (ref 136–145)
Total Bilirubin: 0.4 mg/dL (ref 0.20–1.20)
Total Protein: 6.2 g/dL — ABNORMAL LOW (ref 6.4–8.3)

## 2015-04-29 NOTE — Telephone Encounter (Signed)
lv, for pt regarding to March 2017 appt....mailed pt appt sched/avs and letter

## 2015-04-29 NOTE — Therapy (Signed)
[place], [place], [address] Phone: [phone]   Fax:  [phone]  Physical Therapy Evaluation  Patient Details  Name: Victor Davis MRN: [id number] Date of Birth: [birth year] Referring Provider:  Curt Bears, MD  Encounter Date: 04/29/2015      PT End of Session - 04/29/15 1424    Visit Number 1   Number of Visits 1   PT Start Time 1330   PT Stop Time 1355   PT Time Calculation (min) 25 min   Activity Tolerance Patient tolerated treatment well   Behavior During Therapy [facility] for tasks assessed/performed      Past Medical History  Diagnosis Date  . BRADYCARDIA 10/24/2010  . CALF PAIN, RIGHT 10/22/2009  . 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 Dysuria 10/10/2010  . ERECTILE DYSFUNCTION 04/05/2007  . FATIGUE 10/02/2007  . GLUCOSE INTOLERANCE 07/15/2008  . HYPERLIPIDEMIA 07/30/2008  . HYPERSOMNIA 10/02/2007  . INSOMNIA-SLEEP DISORDER-UNSPEC 10/22/2009  . LOW BACK PAIN 04/05/2007  . OSTEOARTHRITIS 04/05/2007  . PEPTIC ULCER DISEASE 04/05/2007  . PERIPHERAL EDEMA 10/22/2009  . PERIPHERAL VASCULAR DISEASE 07/30/2008  . PROSTATE CANCER, HX OF 04/05/2007  . RESTLESS LEG SYNDROME 05/15/2007  . RHINITIS, ALLERGIC NOS 05/15/2007  . SHOULDER PAIN, LEFT 01/22/2008  . SINUSITIS- ACUTE-NOS 05/15/2007  . Unspecified essential hypertension 04/05/2007  . Unspecified visual loss 07/15/2008  . UTI 10/24/2010  . Impaired glucose tolerance 02/09/2011  . Coronary artery disease      95% followed by 80% mid LAD stenosis, circumflex 80% stenosis. There was a moderate size branching obtuse marginal with 90-95% stenosis in the inferior branch. The right coronary artery had proximal 30% stenosis. Posterior lateral was moderate-sized with 40% stenosis. The EF was 65-70%. He had a DES and  x 2 to the LAD and DES x 2 to the circumflex 03/08/12.    [name] GERD (gastroesophageal reflux disease)   . Emphysema 04/01/2012    By CT chest,  august 2013  . CKD (chronic kidney disease), stage II     Past Surgical History  Procedure Laterality Date  . Inguinal herniorrhapy left  2002    x 2  . Rotator cuff repair      left  . Knee surgery      left  . S/p ventral surgery  2009  . Prostatectomy  2002    radical  . Right hip replacement  09/22/09  . Left hip replacement    . Eye surgery      right  . Percutaneous coronary stent intervention (pci-s) N/A 03/08/2012    Procedure: PERCUTANEOUS CORONARY STENT INTERVENTION (PCI-S);  Surgeon: Burnell Blanks, MD;  Location: [facility] CATH LAB;  Service: Cardiovascular;  Laterality: N/A;  . Video assisted thoracoscopy Right 03/22/2015    Procedure: RIGHT VIDEO ASSISTED THORACOSCOPY;  Surgeon: Melrose Nakayama, MD;  Location: [place];  Service: Thoracic;  Laterality: Right;  . Segmentecomy Right 03/22/2015    Procedure: RIGHT LOWER LOBE SUPERIOR SEGMENTECTOMY;  Surgeon: Melrose Nakayama, MD;  Location: [place];  Service: Thoracic;  Laterality: Right;  . Lymph node dissection Right 03/22/2015    Procedure: LYMPH NODE DISSECTION;  Surgeon: Melrose Nakayama, MD;  Location: [place];  Service: Thoracic;  Laterality: Right;    There were no vitals filed for this visit.  Visit Diagnosis:  Pain in surgical scar - Plan: PT plan of care cert/re-cert  Shortness of breath on exertion - Plan: PT plan of care cert/re-cert  Physical deconditioning -  Plan: PT plan of care cert/re-cert      Subjective Assessment - 04/29/15 1414    Subjective Reports some right side incision pain and limited function of left shoulder s/p failed rotator cuff surgery some years ago.   Patient is accompained by: Family member  wife, who is currently undergoing treatment for breast cancer   Pertinent History Pt. presented with cough and shortness of breath in 2013; right lower lobe nodule was found then and followed since. 02/23/15 CT chest and then 03/22/15 VATS segementectomy.  Diagnosis is minimally invasive  adenocarcinoma; patient will not have further treatment but will be followed in survivorship clinic.  Ex-smoker who quit 1978 after 15 pack-years; prostate CA s/p resection 2002; DVA, CAD x/p stent placement.   Patient Stated Goals get information from lung clinic providers   Currently in Pain? Yes   Pain Score 5    Pain Location Flank   Pain Orientation Right   Pain Descriptors / Indicators Sore   Pain Type Surgical pain   Pain Onset 1 to 4 weeks ago   Pain Frequency Intermittent   Aggravating Factors  turning when lying down; seatbelt rubbing on it   Pain Relieving Factors ibuprofen            [facility] PT Assessment - 04/29/15 0001    Assessment   Medical Diagnosis right lower lobe invasive adenocarcinoma, s/p segmentectomy   Onset Date/Surgical Date 03/22/15  surgery   Precautions   Precautions Other (comment)  cancer precautions   Restrictions   Weight Bearing Restrictions No   Balance Screen   Has the patient fallen in the past 6 months Yes   How many times? one  fluke:  stepped on cat (no need to follow-up in PT)   Has the patient had a decrease in activity level because of a fear of falling?  No   Is the patient reluctant to leave their home because of a fear of falling?  No   Home Environment   Living Environment Private residence   Living Arrangements Spouse/significant other  wife undergoing breast cancer XRT now   Type of [place] to enter   [place] One level   Prior Function   Level of Independence Independent   Leisure walks daily around block, approx. 10-15 mins.   Cognition   Overall Cognitive Status Within Functional Limits for tasks assessed   Observation/Other Assessments   Observations slight man   Functional Tests   Functional tests Sit to Stand   Sit to Stand   Comments 11 times in 30 seconds (below average)   Posture/Postural Control   Posture/Postural Control Postural limitations   Postural Limitations Forward  head;Flexed trunk   Posture Comments slight in both cases   ROM / Strength   AROM / PROM / Strength AROM   AROM   Overall AROM Comments standing trunk AROM WFL for flexion, approx. 15% loss for all other movements   Ambulation/Gait   Ambulation/Gait Yes   Ambulation/Gait Assistance 6: Modified independent (Device/Increase time)  no device, but reports some SOB with ambulation   Balance   Balance Assessed Yes   Dynamic Standing Balance   Dynamic Standing - Comments reaches forward 6 inches in standing (below average)                           PT Education - 04/29/15 1424    Education provided Yes  Education Details posture, breathing, walking, PT info, energy conservation   Person(s) Educated Patient;Spouse   Methods Explanation;Handout   Comprehension Verbalized understanding               Lung Clinic Goals - 05-17-2015 1430    Patient will be able to verbalize understanding of the benefit of exercise to decrease fatigue.   Status Achieved   Patient will be able to verbalize the importance of posture.   Status Achieved   Patient will be able to demonstrate diaphragmatic breathing for improved lung function.   Status Achieved   Patient will be able to verbalize understanding of the role of physical therapy to prevent functional decline and who to contact if physical therapy is needed.   Status Achieved             Plan - 2015/05/17 1424    Clinical Impression Statement Patient s/p segmentectomy for right lower lobe adenocarcinoma doing fairly well currently, though still with some surgical pain approx. 5 weeks later.  He reports some shortness of breath with walking, but requires no assistive device.   Pt will benefit from skilled therapeutic intervention in order to improve on the following deficits Cardiopulmonary status limiting activity;Pain   Rehab Potential Good   PT Frequency One time visit   PT Treatment/Interventions Patient/family  education   PT Next Visit Plan None at this time; patient to work on home exercise program to increase endurance and wellness.   PT Home Exercise Plan see education section   Consulted and Agree with Plan of Care Patient          G-Codes - 2015/05/17 1430    Functional Assessment Tool Used clinical judgement   Functional Limitation Mobility: Walking and moving around   Mobility: Walking and Moving Around Current Status [phone]) At least 20 percent but less than 40 percent impaired, limited or restricted   Mobility: Walking and Moving Around Goal Status [phone]) At least 20 percent but less than 40 percent impaired, limited or restricted   Mobility: Walking and Moving Around Discharge Status [phone]) At least 20 percent but less than 40 percent impaired, limited or restricted       Problem List Patient Active Problem List   Diagnosis Date Noted  . Non-small cell carcinoma of right lung, stage 1 17-May-2015  . Lung nodule 03/22/2015  . Cholecystostomy drain infection 01/25/2015  . Abdominal pain 01/18/2015  . Cholecystitis 01/18/2015  . Acute on chronic kidney failure 01/18/2015  . Right upper quadrant pain   . Acute cholecystitis   . Renal cyst 12/31/2012  . Hearing loss 07/05/2012  . Lesion of nose 07/05/2012  . Anemia, unspecified 04/02/2012  . Pulmonary emphysema 04/01/2012  . Hypertension 03/09/2012  . Pulmonary nodule, right 02/08/2012  . Mitral regurgitation 02/06/2012  . CAD (coronary artery disease) 01/04/2012  . Unspecified disorder of liver 12/18/2011  . Hyperthyroidism 12/18/2011  . Nonspecific abnormal findings on radiological and examination of lung field 12/18/2011  . Impaired glucose tolerance 02/09/2011  . Preventative health care 02/09/2011  . BRADYCARDIA 10/24/2010  . INSOMNIA-SLEEP DISORDER-UNSPEC 10/22/2009  . PERIPHERAL EDEMA 10/22/2009  . HLD (hyperlipidemia) 07/30/2008  . Peripheral vascular disease 07/30/2008  . 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  Unspecified visual loss 07/15/2008  . SHOULDER PAIN, LEFT 01/22/2008  . HYPERSOMNIA 10/02/2007  . RESTLESS LEG SYNDROME 05/15/2007  . RHINITIS, ALLERGIC NOS 05/15/2007  . ERECTILE DYSFUNCTION 04/05/2007  . PEPTIC ULCER DISEASE 04/05/2007  . Osteoarthritis 04/05/2007  . LOW BACK  PAIN 04/05/2007  . PROSTATE CANCER, HX OF 04/05/2007    S[place]IS[place],DONNA 04/29/2015, 2:32 PM  [place], [place], [address] Phone: [phone]   Fax:  [place], PT 04/29/2015 2:32 PM

## 2015-04-29 NOTE — Telephone Encounter (Signed)
Gave adn printed appt sched and avs for pt for march 2017

## 2015-04-29 NOTE — Progress Notes (Signed)
Oncology Nurse Navigator Documentation  Oncology Nurse Navigator Flowsheets 04/29/2015  Navigator Encounter Type Clinic/MDC  Treatment Phase Other  Barriers/Navigation Needs Education  Interventions Education Method  Coordination of Care -  Time Spent with Patient 30         Thoracic Treatment Summary Name:Victor Davis Date:04/29/2015 DOB:03-24-1941 Your Medical Team Medical Oncologist:Dr. Mohamed  Surgeon:Dr. Hendrickson Type and Stage of Lung Cancer Non-Small Cell Carcinoma: Adenocarcinoma   Clinical Stage:  Non-small cell carcinoma of right lung, stage 1   Staging form: Lung, AJCC 7th Edition     Clinical stage from 04/29/2015: Stage IA (T1a, N0, M0) - Signed by Curt Bears, MD on 04/29/2015    Clinical stage is based on radiology exams.  Pathological stage will be determined after surgery.  Staging is based on the size of the tumor, involvement of lymph nodes or not, and whether or not the cancer center has spread. Recommendations Recommendations: 6 months follow up with repeat scan  These recommendations are based on information available as of today's consult.  This is subject to change depending further testing or exams. Next Steps Next Step: Medical Oncology will set up follow up appointments  10/27/15 Follow up appointment with Dr. Julien Nordmann Barriers to Care What do you perceive as a potential barrier that may prevent you from receiving your treatment plan? Education information given and explained    Resources Given: Booklet on NSCLC  Research scientist (life sciences) at [employer].Radonna Ricker [phone] Fall Risk Information    Questions Norton Blizzard, RN BSN Thoracic Oncology Nurse Navigator at [facility] is a nurse navigator that is available to assist you through your cancer journey.  She can answer your questions and/or provide resources regarding your treatment plan, emotional support, or financial concerns.

## 2015-04-29 NOTE — Progress Notes (Signed)
[facility] Clinical Social Work  Clinical Social Work met with patient/family and Futures trader at [facility] appointment to offer support and assess for psychosocial needs.  Victor Davis shared his plan is observation and will only be seen by Dr. Julien Nordmann every six months.  He shared he has no concerns at this time.  He enjoys walking and fishing.  He was accompanied by his spouse.  She is currently in treatment here at the [facility] and is very familiar with services offered.  Clinical Social Work briefly discussed Clinical Social Work role and [employer] support programs/services.  Clinical Social Work encouraged patient to call with any additional questions or concerns.   Polo Riley, MSW, LCSW, OSW-C Clinical Social Worker [facility] [phone]

## 2015-04-29 NOTE — Progress Notes (Signed)
[place] [phone]   Fax:([phone] Multidisciplinary thoracic oncology clinic  CONSULT NOTE  REFERRING PHYSICIAN: Dr. Modesto Charon  REASON FOR CONSULTATION:  74 years old white male recently diagnosed with lung cancer.  HPI LIONARDO HAZE is a 74 y.o. male was past medical history significant for multiple medical problems including history of dyslipidemia, restless leg syndrome, stroke, peripheral vascular disease, osteoarthritis, history of prostate cancer diagnosed in 2001 currently followed by Dr. Bess Harvest and the patient is on treatment with Lupron, hypertension and renal insufficiency as well as remote history of smoking. The patient has been monitored by his primary care physician Dr. Cathlean Cower for questionable right lower lobe lung nodule for several years. CT scan of the chest on 11/02/2014 showed some increase in the size of the groundglass opacity when compared to previous CT scan of the chest on 12/29/2011. This was suspicious for low-grade adenocarcinoma. The patient was referred to Dr. Roxan Hockey and repeat CT scan of the chest on 02/23/2015 showed mild progressive enlargement of the groundglass nodule in the superior segment of the right lower lobe which measured 1.6 cm.  On 03/22/2015 the patient underwent Right video-assisted thoracoscopy, thoracoscopic right lower lobe superior segmentectomy and lymph node sampling. The final pathology (Accession: [id number]) showed minimally invasive well-differentiated adenocarcinoma measuring 0.8 cm with negative resection margin, negative lymphovascular and pleural invasion. The patient is recovering well from his surgery except for the soreness on the right side of the chest and mild cough. He lost around 15 pounds in the last few weeks. He denied having any significant shortness of breath or hemoptysis. He has no nausea or vomiting, no fever or chills. Dr. Roxan Hockey kindly referred the patient to  the multidisciplinary thoracic oncology clinic today for evaluation and recommendation regarding adjuvant therapy and close monitoring. Family history significant for mother with cancer father also had diagnosis of cancer and sister with lung cancer. The patient is married and has 2 children. He was accompanied by his wife [name] who is currently undergoing treatment for breast cancer. The patient used to work as a Administrator. He has a history of smoking 1 pack per day for around 15 years and quit in 1978. He drinks alcohol occasionally and no history of drug abuse.  HPI  Past Medical History  Diagnosis Date  . BRADYCARDIA 10/24/2010  . CALF PAIN, RIGHT 10/22/2009  . 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 Dysuria 10/10/2010  . ERECTILE DYSFUNCTION 04/05/2007  . FATIGUE 10/02/2007  . GLUCOSE INTOLERANCE 07/15/2008  . HYPERLIPIDEMIA 07/30/2008  . HYPERSOMNIA 10/02/2007  . INSOMNIA-SLEEP DISORDER-UNSPEC 10/22/2009  . LOW BACK PAIN 04/05/2007  . OSTEOARTHRITIS 04/05/2007  . PEPTIC ULCER DISEASE 04/05/2007  . PERIPHERAL EDEMA 10/22/2009  . PERIPHERAL VASCULAR DISEASE 07/30/2008  . PROSTATE CANCER, HX OF 04/05/2007  . RESTLESS LEG SYNDROME 05/15/2007  . RHINITIS, ALLERGIC NOS 05/15/2007  . SHOULDER PAIN, LEFT 01/22/2008  . SINUSITIS- ACUTE-NOS 05/15/2007  . Unspecified essential hypertension 04/05/2007  . Unspecified visual loss 07/15/2008  . UTI 10/24/2010  . Impaired glucose tolerance 02/09/2011  . Coronary artery disease      95% followed by 80% mid LAD stenosis, circumflex 80% stenosis. There was a moderate size branching obtuse marginal with 90-95% stenosis in the inferior branch. The right coronary artery had proximal 30% stenosis. Posterior lateral was moderate-sized with 40% stenosis. The EF was 65-70%. He had a DES and  x 2 to the LAD and DES x 2 to the circumflex 03/08/12.    [name]  GERD (gastroesophageal reflux disease)   . Emphysema 04/01/2012    By CT chest, august 2013  . CKD (chronic kidney  disease), stage II     Past Surgical History  Procedure Laterality Date  . Inguinal herniorrhapy left  2002    x 2  . Rotator cuff repair      left  . Knee surgery      left  . S/p ventral surgery  2009  . Prostatectomy  2002    radical  . Right hip replacement  09/22/09  . Left hip replacement    . Eye surgery      right  . Percutaneous coronary stent intervention (pci-s) N/A 03/08/2012    Procedure: PERCUTANEOUS CORONARY STENT INTERVENTION (PCI-S);  Surgeon: Burnell Blanks, MD;  Location: [facility] CATH LAB;  Service: Cardiovascular;  Laterality: N/A;  . Video assisted thoracoscopy Right 03/22/2015    Procedure: RIGHT VIDEO ASSISTED THORACOSCOPY;  Surgeon: Melrose Nakayama, MD;  Location: [place];  Service: Thoracic;  Laterality: Right;  . Segmentecomy Right 03/22/2015    Procedure: RIGHT LOWER LOBE SUPERIOR SEGMENTECTOMY;  Surgeon: Melrose Nakayama, MD;  Location: [place];  Service: Thoracic;  Laterality: Right;  . Lymph node dissection Right 03/22/2015    Procedure: LYMPH NODE DISSECTION;  Surgeon: Melrose Nakayama, MD;  Location: [place];  Service: Thoracic;  Laterality: Right;    Family History  Problem Relation Age of Onset  . Heart attack Father 65    Smoker  . Heart attack Brother 29  . Colon cancer Neg Hx   . Lung cancer Sister     Social History Social History  Substance Use Topics  . Smoking status: Former Smoker -- 1.00 packs/day for 15 years    Types: Cigarettes    Quit date: 02/05/1977  . Smokeless tobacco: Never Used  . Alcohol Use: 0.5 oz/week    1 Standard drinks or equivalent per week     Comment: RARE    Allergies  Allergen Reactions  . Amitriptyline Hcl Other (See Comments)    REACTION: confusion  . Diphenhydramine Hcl Other (See Comments)    Keeps him awake  . Simvastatin Other (See Comments)    REACTION: leg pain  . Tylenol [Acetaminophen] Other (See Comments)    Dr advised pt not to take due to finding a spot on pt's kidney    Current  Outpatient Prescriptions  Medication Sig Dispense Refill  . aspirin 81 MG EC tablet Take 81 mg by mouth daily.      . citalopram (CELEXA) 40 MG tablet Take 1 tablet (40 mg total) by mouth daily. 90 tablet 3  . cloNIDine (CATAPRES) 0.1 MG tablet Take 1 tablet by mouth every AM and 2 tablets by mouth every PM 270 tablet 3  . clopidogrel (PLAVIX) 75 MG tablet TAKE 1 TABLET EVERY DAY 30 tablet 5  . isosorbide mononitrate (IMDUR) 30 MG 24 hr tablet TAKE 1 TABLET (30 MG TOTAL) BY MOUTH DAILY. 30 tablet 5  . loratadine (CLARITIN) 10 MG tablet Take 10 mg by mouth daily.    [name] lovastatin (MEVACOR) 40 MG tablet Take 40 mg by mouth at bedtime.  9  . pramipexole (MIRAPEX) 1 MG tablet TAKE 1 TABLET (1 MG TOTAL) BY MOUTH 3 (THREE) TIMES DAILY. 90 tablet 5  . amLODipine (NORVASC) 10 MG tablet Take 1 tablet (10 mg total) by mouth daily. 90 tablet 3  . nitroGLYCERIN (NITROSTAT) 0.4 MG SL tablet Place 1 tablet (0.4 mg  total) under the tongue every 5 (five) minutes as needed for chest pain. (Patient not taking: Reported on 04/29/2015) 25 tablet 6  . oxyCODONE (OXY IR/ROXICODONE) 5 MG immediate release tablet Take 1-2 tablets (5-10 mg total) by mouth every 6 (six) hours as needed for severe pain. (Patient not taking: Reported on 04/29/2015) 50 tablet 0  . pantoprazole (PROTONIX) 40 MG tablet TAKE 1 TABLET EVERY DAY (Patient not taking: Reported on 04/29/2015) 30 tablet 0   No current facility-administered medications for this visit.    Review of Systems  Constitutional: positive for fatigue and weight loss Eyes: negative Ears, nose, mouth, throat, and face: negative Respiratory: positive for cough, pleurisy/chest pain and sputum Cardiovascular: negative Gastrointestinal: negative Genitourinary:negative Integument/breast: negative Hematologic/lymphatic: negative Musculoskeletal:negative Neurological: negative Behavioral/Psych: negative Endocrine: negative Allergic/Immunologic: negative  Physical  Exam  ZOX:WRUEA, healthy, no distress, well nourished and well developed SKIN: skin color, texture, turgor are normal, no rashes or significant lesions HEAD: Normocephalic, No masses, lesions, tenderness or abnormalities EYES: normal, PERRLA EARS: External ears normal, Canals clear OROPHARYNX:no exudate, no erythema and lips, buccal mucosa, and tongue normal  NECK: supple, no adenopathy, no JVD LYMPH:  no palpable lymphadenopathy, no hepatosplenomegaly LUNGS: clear to auscultation , and palpation HEART: regular rate & rhythm, no murmurs and no gallops ABDOMEN:abdomen soft, non-tender, normal bowel sounds and no masses or organomegaly BACK: Back symmetric, no curvature., No CVA tenderness EXTREMITIES:no joint deformities, effusion, or inflammation, no edema, no skin discoloration  NEURO: alert & oriented x 3 with fluent speech, no focal motor/sensory deficits  PERFORMANCE STATUS: ECOG 1  LABORATORY DATA: Lab Results  Component Value Date   WBC 5.9 04/29/2015   HGB 10.7* 04/29/2015   HCT 32.6* 04/29/2015   MCV 86.0 04/29/2015   PLT 154 04/29/2015      Chemistry      Component Value Date/Time   NA 143 04/29/2015 1222   NA 137 03/25/2015 0511   K 4.2 04/29/2015 1222   K 4.1 03/25/2015 0511   CL 104 03/25/2015 0511   CO2 25 04/29/2015 1222   CO2 27 03/25/2015 0511   BUN 25.2 04/29/2015 1222   BUN 14 03/25/2015 0511   CREATININE 1.5* 04/29/2015 1222   CREATININE 1.39* 03/25/2015 0511      Component Value Date/Time   CALCIUM 9.4 04/29/2015 1222   CALCIUM 8.7* 03/25/2015 0511   ALKPHOS 77 04/29/2015 1222   ALKPHOS 57 03/24/2015 0437   AST 12 04/29/2015 1222   AST 13* 03/24/2015 0437   ALT 7 04/29/2015 1222   ALT 9* 03/24/2015 0437   BILITOT 0.40 04/29/2015 1222   BILITOT 1.0 03/24/2015 0437       RADIOGRAPHIC STUDIES: Dg Chest 2 View  04/13/2015   CLINICAL DATA:  Pain, shortness of breath, former smoker, pulmonary emphysema, post lymph node dissection on  03/22/2015 ; past history unspecified essential hypertension, coronary artery disease, GERD, prostate cancer  EXAM: CHEST  2 VIEW  COMPARISON:  03/27/2015  FINDINGS: Normal heart size, mediastinal contours, and pulmonary vascularity.  Atherosclerotic calcification aorta.  Persistent atelectasis and pleural effusion at RIGHT lung base.  Remaining lungs clear.  No infiltrate, pneumothorax, or acute osseous findings.  Coronary arterial stents incidentally noted.  IMPRESSION: Persistent atelectasis and pleural effusion at RIGHT lung base.  Little interval change.   Electronically Signed   By: Lavonia Dana M.D.   On: 04/13/2015 11:28    ASSESSMENT: This is a very pleasant 74 years old white male recently diagnosed with a  stage IA (T1a, N0, M0) non-small cell lung cancer, well-differentiated adenocarcinoma status post right lower lobe superior segmentectomy with lymph node sampling on 03/22/2015.   PLAN: I had a lengthy discussion with the patient and his wife today about his current disease stage, prognosis and treatment options. I explained to the patient that he has very early stage of lung cancer and the 5 year survival is around 80%. I also explained to the patient that there is no survival benefit for adjuvant systemic chemotherapy for patient with a stage IA lung cancer. The current standard of care is close observation and monitoring with repeat scan. I will arrange for the patient to come back for follow-up visit in 6 months for reevaluation with repeat CT scan of the chest without contrast because of his renal insufficiency. For the iron deficiency anemia, I recommended for the patient to start taking oral iron tablets with vitamin C. He was seen during the multidisciplinary thoracic oncology clinic today by medical oncology, thoracic navigator, social worker and physical therapist. The patient was advised to call immediately if he has any concerning symptoms in the interval. The patient voices  understanding of current disease status and treatment options and is in agreement with the current care plan.  All questions were answered. The patient knows to call the clinic with any problems, questions or concerns. We can certainly see the patient much sooner if necessary.  Thank you so much for allowing me to participate in the care of SLAYTON LUBITZ. I will continue to follow up the patient with you and assist in his care.  I spent 40 minutes counseling the patient face to face. The total time spent in the appointment was 60 minutes.  Disclaimer: This note was dictated with voice recognition software. Similar sounding words can inadvertently be transcribed and may not be corrected upon review.   Filippo Puls K. April 29, 2015, 1:38 PM

## 2015-05-01 ENCOUNTER — Other Ambulatory Visit: Payer: Self-pay | Admitting: Internal Medicine

## 2015-05-01 DIAGNOSIS — C3491 Malignant neoplasm of unspecified part of right bronchus or lung: Secondary | ICD-10-CM

## 2015-05-05 ENCOUNTER — Other Ambulatory Visit: Payer: Self-pay | Admitting: Nurse Practitioner

## 2015-05-08 ENCOUNTER — Other Ambulatory Visit: Payer: Self-pay | Admitting: Internal Medicine

## 2015-05-14 ENCOUNTER — Telehealth: Payer: Self-pay | Admitting: *Deleted

## 2015-05-14 NOTE — Telephone Encounter (Signed)
Oncology Nurse Navigator Documentation  Oncology Nurse Navigator Flowsheets 05/14/2015  Navigator Encounter Type Telephone/called to follow up with patient.  I spoke with wife and she updated me that patient was doing well.  We also spoke about her dealing with her radiation treatment.  I listened as she explained.  She stated she was doing well just tired.  She also stated she went to [facility] in the [facility] yesterday with Mr. Gettel.  I listened as she told me about it.  She enjoyed herself at the event.  I asked that she or her husband call if needed help from the cancer center.   Patient Visit Type Follow-up  Treatment Phase Other  Barriers/Navigation Needs None  Interventions -  Coordination of Care -  Time Spent with Patient 15

## 2015-05-16 ENCOUNTER — Other Ambulatory Visit: Payer: Self-pay | Admitting: Nurse Practitioner

## 2015-05-17 ENCOUNTER — Other Ambulatory Visit: Payer: Self-pay | Admitting: Internal Medicine

## 2015-05-24 ENCOUNTER — Ambulatory Visit (INDEPENDENT_AMBULATORY_CARE_PROVIDER_SITE_OTHER): Payer: Medicare Other | Admitting: Cardiology

## 2015-05-24 ENCOUNTER — Encounter: Payer: Self-pay | Admitting: Cardiology

## 2015-05-24 VITALS — BP 180/88 | HR 87 | Ht 68.0 in | Wt 150.4 lb

## 2015-05-24 DIAGNOSIS — I6523 Occlusion and stenosis of bilateral carotid arteries: Secondary | ICD-10-CM

## 2015-05-24 DIAGNOSIS — R55 Syncope and collapse: Secondary | ICD-10-CM | POA: Diagnosis not present

## 2015-05-24 DIAGNOSIS — I2510 Atherosclerotic heart disease of native coronary artery without angina pectoris: Secondary | ICD-10-CM

## 2015-05-24 DIAGNOSIS — I2583 Coronary atherosclerosis due to lipid rich plaque: Secondary | ICD-10-CM

## 2015-05-24 NOTE — Patient Instructions (Signed)
Medication Instructions:  The current medical regimen is effective;  continue present plan and medications.  Testing/Procedures: Your physician has recommended that you wear an event monitor. Event monitors are medical devices that record the heart's electrical activity. Doctors most often [place] these monitors to diagnose arrhythmias. Arrhythmias are problems with the speed or rhythm of the heartbeat. The monitor is a small, portable device. You can wear one while you do your normal daily activities. This is usually used to diagnose what is causing palpitations/syncope (passing out).  Follow-Up: Follow up with Dr Angelena Form in 6 to 8 weeks.  Thank you for choosing [facility]!!

## 2015-05-24 NOTE — Progress Notes (Addendum)
Cardiology Office Note   Date:  05/24/2015   ID:  Victor Davis, Victor Davis 14-Jul-1941, MRN [id number]  PCP:  Cathlean Cower, MD  Cardiologist: Dr. Angelena Form       History of Present Illness: Victor Davis is a 74 y.o. male  Here for evaluation of syncope. He has an extensive cardiac history , CAD, CVA, hypertension, hyperlipidemia, peripheral arterial disease as well as non-small cell lung cancer status post resection by Dr. Roxan Hockey in August 2016. He had been doing well since his operation.    over the weekend, while at [facility], he was getting up 2 bring his tray to the trash when he had a syncopal episode. People caught him before he hit the ground. His wife witnessed this and stated that his eyes rolled back into his head. He somewhat denies the fact that he had a syncopal episode and thinks that he had more than a mechanical fall but his wife states the contrary. He was out for a few seconds. EMS was called. When they arrived, they ran an EKG on him which demonstrated sinus bradycardia rate 49. No evidence of heart block.   Previously, he had an episode that was somewhat similar when helping his friend out , changing brakes on his truck. He felt dizzy but did not completely pass out.   He is not on metoprolol, no diltiazem. He does take clonidine for hypertension.   Since this episode, he has not had any issues.    Past Medical History  Diagnosis Date  . BRADYCARDIA 10/24/2010  . CALF PAIN, RIGHT 10/22/2009  . 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 Dysuria 10/10/2010  . ERECTILE DYSFUNCTION 04/05/2007  . FATIGUE 10/02/2007  . GLUCOSE INTOLERANCE 07/15/2008  . HYPERLIPIDEMIA 07/30/2008  . HYPERSOMNIA 10/02/2007  . INSOMNIA-SLEEP DISORDER-UNSPEC 10/22/2009  . LOW BACK PAIN 04/05/2007  . OSTEOARTHRITIS 04/05/2007  . PEPTIC ULCER DISEASE 04/05/2007  . PERIPHERAL EDEMA 10/22/2009  . PERIPHERAL VASCULAR DISEASE 07/30/2008  . PROSTATE CANCER, HX OF 04/05/2007  . RESTLESS LEG SYNDROME  05/15/2007  . RHINITIS, ALLERGIC NOS 05/15/2007  . SHOULDER PAIN, LEFT 01/22/2008  . SINUSITIS- ACUTE-NOS 05/15/2007  . Unspecified essential hypertension 04/05/2007  . Unspecified visual loss 07/15/2008  . UTI 10/24/2010  . Impaired glucose tolerance 02/09/2011  . Coronary artery disease      95% followed by 80% mid LAD stenosis, circumflex 80% stenosis. There was a moderate size branching obtuse marginal with 90-95% stenosis in the inferior branch. The right coronary artery had proximal 30% stenosis. Posterior lateral was moderate-sized with 40% stenosis. The EF was 65-70%. He had a DES and  x 2 to the LAD and DES x 2 to the circumflex 03/08/12.    [name] GERD (gastroesophageal reflux disease)   . Emphysema 04/01/2012    By CT chest, august 2013  . CKD (chronic kidney disease), stage II     Past Surgical History  Procedure Laterality Date  . Inguinal herniorrhapy left  2002    x 2  . Rotator cuff repair      left  . Knee surgery      left  . S/p ventral surgery  2009  . Prostatectomy  2002    radical  . Right hip replacement  09/22/09  . Left hip replacement    . Eye surgery      right  . Percutaneous coronary stent intervention (pci-s) N/A 03/08/2012    Procedure: PERCUTANEOUS CORONARY STENT INTERVENTION (PCI-S);  Surgeon: Burnell Blanks, MD;  Location: [facility];  Service: Cardiovascular;  Laterality: N/A;  . Video assisted thoracoscopy Right 03/22/2015    Procedure: RIGHT VIDEO ASSISTED THORACOSCOPY;  Surgeon: Melrose Nakayama, MD;  Location: [facility];  Service: Thoracic;  Laterality: Right;  . Segmentecomy Right 03/22/2015    Procedure: RIGHT LOWER LOBE SUPERIOR SEGMENTECTOMY;  Surgeon: Melrose Nakayama, MD;  Location: [place];  Service: Thoracic;  Laterality: Right;  . Lymph node dissection Right 03/22/2015    Procedure: LYMPH NODE DISSECTION;  Surgeon: Melrose Nakayama, MD;  Location: [place];  Service: Thoracic;  Laterality: Right;     Current Outpatient Prescriptions    Medication Sig Dispense Refill  . amLODipine (NORVASC) 10 MG tablet Take 1 tablet (10 mg total) by mouth daily. 90 tablet 3  . aspirin 81 MG EC tablet Take 81 mg by mouth daily.      . citalopram (CELEXA) 40 MG tablet Take 1 tablet (40 mg total) by mouth daily. 90 tablet 3  . cloNIDine (CATAPRES) 0.1 MG tablet Take 1 tablet by mouth every AM and 2 tablets by mouth every PM 270 tablet 3  . clopidogrel (PLAVIX) 75 MG tablet TAKE 1 TABLET EVERY DAY 30 tablet 5  . isosorbide mononitrate (IMDUR) 30 MG 24 hr tablet TAKE 1 TABLET (30 MG TOTAL) BY MOUTH DAILY. 30 tablet 5  . loratadine (CLARITIN) 10 MG tablet Take 10 mg by mouth daily.    [name] lovastatin (MEVACOR) 40 MG tablet Take 40 mg by mouth at bedtime.  9  . nitroGLYCERIN (NITROSTAT) 0.4 MG SL tablet Place 1 tablet (0.4 mg total) under the tongue every 5 (five) minutes as needed for chest pain. 25 tablet 6  . oxyCODONE (OXY IR/ROXICODONE) 5 MG immediate release tablet Take 1-2 tablets (5-10 mg total) by mouth every 6 (six) hours as needed for severe pain. 50 tablet 0  . pantoprazole (PROTONIX) 40 MG tablet TAKE 1 TABLET BY MOUTH DAILY 30 tablet 5  . pramipexole (MIRAPEX) 1 MG tablet TAKE 1 TABLET 3 TIMES A DAY FOR RESTLESS LEG 90 tablet 0   No current facility-administered medications for this visit.    Allergies:   Amitriptyline hcl; Diphenhydramine hcl; Simvastatin; and Tylenol    Social History:  The patient  reports that he quit smoking about 38 years ago. His smoking use included Cigarettes. He has a 15 pack-year smoking history. He has never used smokeless tobacco. He reports that he drinks about 0.5 oz of alcohol per week. He reports that he does not use illicit drugs.   Family History:  The patient's family history includes Heart attack (age of onset: 85) in his father; Heart attack (age of onset: 31) in his brother; Lung cancer in his sister. There is no history of Colon cancer.    ROS:  Please see the history of present illness.    Otherwise, review of systems are positive for none.   All other systems are reviewed and negative.    PHYSICAL EXAM: VS:  BP 180/88 mmHg  Pulse 87  Ht '5\' 8"'$  (1.727 m)  Wt 150 lb 6.4 oz (68.221 kg)  BMI 22.87 kg/m2 , BMI Body mass index is 22.87 kg/(m^2). GEN:  Elderly , thin, in no acute distress HEENT: normal Neck: no JVD, carotid bruits, or masses Cardiac: RRR; no murmurs, rubs, or gallops,no edema  Respiratory:  clear to auscultation bilaterally, normal work of breathing , postsurgical scars well healing GI: soft, nontender, nondistended, + BS MS: no deformity or atrophy  Skin: warm and dry, no rash Neuro:  Strength and sensation are intact Psych: euthymic mood, full affect   EKG:   Today 05/24/15-sinus rhythm, 63 , no other abnormalities.   EMS strip-Prior EKG shows sinus bradycardia heart rate 49 , no evidence of heart block , nonspecific T-wave flattening inferior leads , normal QRS duration.  Recent Labs: 10/20/2014: TSH 3.79 04/29/2015: ALT 7; BUN 25.2; Creatinine 1.5*; HGB 10.7*; Platelets 154; Potassium 4.2; Sodium 143    Lipid Panel    Component Value Date/Time   CHOL 133 10/20/2014 1013   TRIG 77.0 10/20/2014 1013   HDL 35.80* 10/20/2014 1013   CHOLHDL 4 10/20/2014 1013   VLDL 15.4 10/20/2014 1013   LDLCALC 82 10/20/2014 1013   LDLDIRECT 104.9 02/10/2011 1224      Wt Readings from Last 3 Encounters:  05/24/15 150 lb 6.4 oz (68.221 kg)  04/29/15 144 lb 4.8 oz (65.454 kg)  04/22/15 144 lb (65.318 kg)      Other studies Reviewed: Additional studies/ records that were reviewed today include:  Prior office records, testing, cardiac catheterization. Review of the above records demonstrates:  As above   ASSESSMENT AND PLAN:  1. Syncope- likely had an episode of orthostatic hypotension. His orthostatics today were normal. His blood pressure increased upon standing. Overall reassuring. When EMS arrived, his heart rate was 49 bpm , sinus bradycardia. No evidence  of heart block. He has had a similar symptom when helping out of friend change brakes on his truck.  He is not on any AV nodal blocking agents such as metoprolol or diltiazem. He is on clonidine which can sometimes slow sinus node conduction. For now, I will not make any medication changes. I will however check an event monitor , 30 day to ensure that he does not have any injuries bradycardia that may require pacemaker placement.   2. Coronary artery disease- post PCI. Doing well. Prior LAD stents 2 and circumflex stents 2.   3. Lung resection in August 2016-Dr. Hendrickson -stage I non-small cell lung cancer. Overall doing well.   4. Carotid Dopplers 40-59% bilateral stenosis in April 2016.     Current medicines are reviewed at length with the patient today.  The patient does not have concerns regarding medicines.  The following changes have been made:  no change  Labs/ tests ordered today include:   Orders Placed This Encounter  Procedures  . Cardiac event monitor  . EKG 12-Lead     Disposition:    Follow-up with Dr. Angelena Form  In approximately 6 weeks after event monitor.  Bobby Rumpf, MD  05/24/2015 12:27 PM    [facility] [place], [place], [place]  [address] Phone: [phone]; Fax: [phone]   ADDENDUM: Reassuring Event monitor  Candee Furbish, MD

## 2015-05-25 ENCOUNTER — Other Ambulatory Visit: Payer: Self-pay | Admitting: Internal Medicine

## 2015-05-26 ENCOUNTER — Ambulatory Visit (INDEPENDENT_AMBULATORY_CARE_PROVIDER_SITE_OTHER): Payer: Medicare Other

## 2015-05-26 DIAGNOSIS — R55 Syncope and collapse: Secondary | ICD-10-CM

## 2015-06-01 DIAGNOSIS — C61 Malignant neoplasm of prostate: Secondary | ICD-10-CM | POA: Diagnosis not present

## 2015-06-07 DIAGNOSIS — C61 Malignant neoplasm of prostate: Secondary | ICD-10-CM | POA: Diagnosis not present

## 2015-06-07 DIAGNOSIS — N393 Stress incontinence (female) (male): Secondary | ICD-10-CM | POA: Diagnosis not present

## 2015-06-10 ENCOUNTER — Other Ambulatory Visit: Payer: Self-pay | Admitting: Cardiovascular Disease

## 2015-06-14 ENCOUNTER — Other Ambulatory Visit: Payer: Self-pay | Admitting: Thoracic Surgery (Cardiothoracic Vascular Surgery)

## 2015-06-14 DIAGNOSIS — C3490 Malignant neoplasm of unspecified part of unspecified bronchus or lung: Secondary | ICD-10-CM

## 2015-06-15 ENCOUNTER — Encounter: Payer: Self-pay | Admitting: Thoracic Surgery (Cardiothoracic Vascular Surgery)

## 2015-06-15 NOTE — Progress Notes (Signed)
This encounter was created in error - please disregard.

## 2015-06-16 NOTE — Progress Notes (Signed)
Patient ID: Victor Davis, male   DOB: 01/29/1941, 74 y.o.   MRN: [id number] Patient did not come to appointment  Remo Lipps C. Roxan Hockey, MD [facility] [phone]

## 2015-06-18 ENCOUNTER — Other Ambulatory Visit: Payer: Self-pay | Admitting: Nurse Practitioner

## 2015-06-19 ENCOUNTER — Other Ambulatory Visit: Payer: Self-pay | Admitting: Internal Medicine

## 2015-06-20 ENCOUNTER — Other Ambulatory Visit: Payer: Self-pay | Admitting: Internal Medicine

## 2015-06-22 ENCOUNTER — Ambulatory Visit
Admission: RE | Admit: 2015-06-22 | Discharge: 2015-06-22 | Disposition: A | Discharge status: Home / Self Care | Payer: Medicare Other | Source: Ambulatory Visit | Attending: Thoracic Surgery (Cardiothoracic Vascular Surgery) | Admitting: Thoracic Surgery (Cardiothoracic Vascular Surgery)

## 2015-06-22 ENCOUNTER — Encounter: Payer: Self-pay | Admitting: Thoracic Surgery (Cardiothoracic Vascular Surgery)

## 2015-06-22 ENCOUNTER — Ambulatory Visit (INDEPENDENT_AMBULATORY_CARE_PROVIDER_SITE_OTHER): Payer: Self-pay | Admitting: Thoracic Surgery (Cardiothoracic Vascular Surgery)

## 2015-06-22 VITALS — BP 190/90 | HR 80 | Resp 20 | Ht 68.0 in | Wt 150.0 lb

## 2015-06-22 DIAGNOSIS — Z902 Acquired absence of lung [part of]: Secondary | ICD-10-CM

## 2015-06-22 DIAGNOSIS — C3490 Malignant neoplasm of unspecified part of unspecified bronchus or lung: Secondary | ICD-10-CM

## 2015-06-22 DIAGNOSIS — Z85118 Personal history of other malignant neoplasm of bronchus and lung: Secondary | ICD-10-CM | POA: Diagnosis not present

## 2015-06-22 DIAGNOSIS — C3491 Malignant neoplasm of unspecified part of right bronchus or lung: Secondary | ICD-10-CM

## 2015-06-22 NOTE — Progress Notes (Signed)
[address]       [place] [address]             [phone]       HPI:  [name] returns today for a scheduled 3 month follow up visit.  He is a 74 year old man who had a thoracoscopic right lower lobe superior segmentectomy and node sampling on 03/22/2015. 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 We used an On-Q catheter for pain control postoperatively.  He still has some discomfort related to his incision, but is not taking any pain medication for that. He has not had any significant shortness of breath. He did have a fall. He describes it as tripping. He is currently on a heart monitor which comes off Saturday. He has been working with pulmonary rehabilitation.  He saw Dr. Julien Davis. No adjuvant chemotherapy was recommended.  Past Medical History  Diagnosis Date  . BRADYCARDIA 10/24/2010  . CALF PAIN, RIGHT 10/22/2009  . 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 Dysuria 10/10/2010  . ERECTILE DYSFUNCTION 04/05/2007  . FATIGUE 10/02/2007  . GLUCOSE INTOLERANCE 07/15/2008  . HYPERLIPIDEMIA 07/30/2008  . HYPERSOMNIA 10/02/2007  . INSOMNIA-SLEEP DISORDER-UNSPEC 10/22/2009  . LOW BACK PAIN 04/05/2007  . OSTEOARTHRITIS 04/05/2007  . PEPTIC ULCER DISEASE 04/05/2007  . PERIPHERAL EDEMA 10/22/2009  . PERIPHERAL VASCULAR DISEASE 07/30/2008  . PROSTATE CANCER, HX OF 04/05/2007  . RESTLESS LEG SYNDROME 05/15/2007  . RHINITIS, ALLERGIC NOS 05/15/2007  . SHOULDER PAIN, LEFT 01/22/2008  . SINUSITIS- ACUTE-NOS 05/15/2007  . Unspecified essential hypertension 04/05/2007  . Unspecified visual loss 07/15/2008  . UTI 10/24/2010  . Impaired glucose tolerance 02/09/2011  . Coronary artery disease      95% followed by 80% mid LAD stenosis, circumflex 80% stenosis. There was a moderate size branching obtuse marginal with 90-95% stenosis in the inferior branch. The right coronary artery had proximal 30% stenosis. Posterior lateral  was moderate-sized with 40% stenosis. The EF was 65-70%. He had a DES and  x 2 to the LAD and DES x 2 to the circumflex 03/08/12.    [name] GERD (gastroesophageal reflux disease)   . Emphysema 04/01/2012    By CT chest, august 2013  . CKD (chronic kidney disease), stage II      Current Outpatient Prescriptions  Medication Sig Dispense Refill  . amLODipine (NORVASC) 10 MG tablet Take 1 tablet (10 mg total) by mouth daily. 90 tablet 3  . aspirin 81 MG EC tablet Take 81 mg by mouth daily.      . bicalutamide (CASODEX) 50 MG tablet 1 TAB DAILY FOR PROSTATE CANCER  3  . citalopram (CELEXA) 40 MG tablet TAKE 1 TABLET EVERY DAY 30 tablet 5  . cloNIDine (CATAPRES) 0.1 MG tablet TAKE 1 TABLET IN THE MORNING TAKE 2 TABLETS IN THE EVENING 90 tablet 1  . clopidogrel (PLAVIX) 75 MG tablet TAKE 1 TABLET EVERY DAY 30 tablet 5  . isosorbide mononitrate (IMDUR) 30 MG 24 hr tablet TAKE 1 TABLET (30 MG TOTAL) BY MOUTH DAILY. 30 tablet 11  . loratadine (CLARITIN) 10 MG tablet Take 10 mg by mouth daily.    [name] lovastatin (MEVACOR) 40 MG tablet Take 40 mg by mouth at bedtime.  9  . nitroGLYCERIN (NITROSTAT) 0.4 MG SL tablet Place 1 tablet (0.4 mg total) under the tongue every 5 (five) minutes as needed for chest pain. 25 tablet 6  . oxyCODONE (OXY IR/ROXICODONE) 5 MG  immediate release tablet Take 1-2 tablets (5-10 mg total) by mouth every 6 (six) hours as needed for severe pain. 50 tablet 0  . pantoprazole (PROTONIX) 40 MG tablet TAKE 1 TABLET BY MOUTH DAILY 30 tablet 5  . pramipexole (MIRAPEX) 1 MG tablet TAKE 1 TABLET 3 TIMES A DAY FOR RESTLESS LEG 90 tablet 0   No current facility-administered medications for this visit.    Physical Exam BP 190/90 mmHg  Pulse 80  Resp 20  Ht '5\' 8"'$  (1.727 m)  Wt 150 lb (68.04 kg)  BMI 22.81 kg/m2  SpO24 83% 74 year old man in no acute distress Alert and oriented 3 with no focal deficit Heart monitor in place Cardiac regular rate and rhythm Lungs equal diminished  bilaterally Incisions well healed  Diagnostic Tests: CHEST 2 VIEW  COMPARISON: Chest radiograph 04/13/2015  FINDINGS: Monitoring leads overlie the patient. Stable cardiac and mediastinal contours. Stable postsurgical changes right lower hemi thorax. Left lung is clear. Persistent blunting of the right costophrenic angle. Mid thoracic spine degenerative changes.  IMPRESSION: Stable chest radiograph with probable right pleural effusion and associated right basilar atelectasis.   Electronically Signed  By: Victor Davis M.D.  On: 06/22/2015 15:40  I personally reviewed his chest x-ray that shows continued evolution of postoperative changes with no evidence of recurrent disease  Impression: 74 yo man who is now 3 months out from a thoracoscopic right lower lobe superior segmentectomy for stage IA non-small cell carcinoma. He is doing well from a surgical standpoint. He has some mild discomfort, but is not requiring any pain medication. He has been working with pulmonary rehabilitation.  His blood pressure was markedly elevated on arrival today. The initial systolic pressure was 163. That improved to 190 after a few minutes. About 20 minutes later it was down to 170. He should follow up with Dr. Jenny Davis regarding that issue.  Plan: Dr. Julien Davis is planning to do a CT scan in 6 months.  I'll plan to see him back in about 6 months as well  Victor Nakayama, MD [facility] [phone]

## 2015-07-20 NOTE — Progress Notes (Signed)
Cardiology Office Note   Date:  07/21/2015   ID:  Thurman, Sarver 21-May-1941, MRN [id number]   Patient Care Team: Biagio Borg, MD as PCP - General Burnell Blanks, MD as Consulting Physician (Cardiology)    Chief Complaint  Patient presents with  . Follow-up  . Loss of Consciousness     History of Present Illness: Victor Davis is a 74 y.o. male with a hx of CAD, prior CVA, HTN, HL, PAD, prostate CA.  He was seen as a new patient 02/06/12 for evaluation of abnormal chest CT. His chest CT showed calcification of the aorta and coronary arteries. He described occasional chest pains that occurred at rest and with exertion. LHC 02/12/12 showed severe LAD and Circumflex disease. PCI delayed because of renal insufficiency and while he was having a renal cyst workup. He was brought back on 03/08/12 and underwent PCI with 2 DES in the LAD and 2 DES in the Circumflex. He did well following the PCI. Admitted to [facility] [place] August 2016 for resection of right lung adenocarcinoma. Carotid dopplers with 40-59% bilateral stenosis April 2016.  Last seen by Dr. Lauree Chandler 8/16.  He was recently seen by Dr. Candee Furbish for evaluation of syncope on 05/24/15.  Symptoms occurred shortly after standing up at a restaurant.  It was felt that the episode was caused by orthostatic hypotension.  Event monitor was arranged.  This demonstrated NSR.  There was no bradycardia or high grade HB.    Returns for FU.  He is doing well. We reviewed his symptoms that prompted his visit back in October. He tells me he bent over to pick up an object on the floor. He lost his balance when he stood up. He tells me that he remembers everything and does not feel that he lost consciousness. He denies any dizziness or syncope. He denies chest pain, short of breath, orthopnea, PND or edema.   Studies/Reports Reviewed Today:  Event Monitor 05/26/15 Sinus rhythm.  No high grade AV block noted.  No bradycardia noted.    Rare premature ventricular contractions.   Carotid US 4/16 Bilateral ICA 40-59%  Myoview 6/13 Low risk stress nuclear study. Perfusion imaging is normal. However there is 1-2 mm of inferolateral ST depression on ECG with stress.   LV Ejection Fraction: 50%. LV Wall Motion: Normal  PCI 7/13 1. Successful PTC/Promus DES x 2 LAD 2.Successful PTC/Promus DES x 2 Circumflex  LHC 02/12/12 Angiographic Findings: Left main: No obstructive disease noted.  Left Anterior Descending Artery: Large caliber vessel that courses to the apex. The proximal vessel has diffuse 40% stenosis. The mid vessel has a 95% stenosis followed by 80% stenosis. There are 3 small caliber diagonal branches that arise from the mid vessel. There is a large septal perforating branch.  Circumflex Artery:Moderate sized vessel with 80% proximal stenosis. The first OM is very small. The AV groove Circumflex terminates in a moderate sized bifurcating obtuse marginal branch. The inferior sub-branch has a 90-95% stenosis.  Right Coronary Artery: Large, dominant vessel with diffuse 30% stenosis in the proximal, mid and distal vessel. The PDA is moderate sized and patent. The PL branch is moderate sized and has a proximal 40% stenosis.  Left Ventricular Angiogram: LVEF 65-70%.  Impression: 1. 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  2. Preserved LV systolic function 3. Stress myoview suggesting apical ischemia with exercise EKG with ST depression.  Recommendations: He has double  vessel CAD which I feel can be approached with PCI. Will stage PCI of the LAD and Circumflex later in the week. Will load with Plavix 600 mg po x 1 today. He will be started on ASA and Plavix daily until the PCI.    Past Medical History  Diagnosis Date  . BRADYCARDIA 10/24/2010  . CALF PAIN, RIGHT 10/22/2009  . 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 Dysuria 10/10/2010  . ERECTILE DYSFUNCTION 04/05/2007  .  FATIGUE 10/02/2007  . GLUCOSE INTOLERANCE 07/15/2008  . HYPERLIPIDEMIA 07/30/2008  . HYPERSOMNIA 10/02/2007  . INSOMNIA-SLEEP DISORDER-UNSPEC 10/22/2009  . LOW BACK PAIN 04/05/2007  . OSTEOARTHRITIS 04/05/2007  . PEPTIC ULCER DISEASE 04/05/2007  . PERIPHERAL EDEMA 10/22/2009  . PERIPHERAL VASCULAR DISEASE 07/30/2008  . PROSTATE CANCER, HX OF 04/05/2007  . RESTLESS LEG SYNDROME 05/15/2007  . RHINITIS, ALLERGIC NOS 05/15/2007  . SHOULDER PAIN, LEFT 01/22/2008  . SINUSITIS- ACUTE-NOS 05/15/2007  . Unspecified essential hypertension 04/05/2007  . Unspecified visual loss 07/15/2008  . UTI 10/24/2010  . Impaired glucose tolerance 02/09/2011  . Coronary artery disease      95% followed by 80% mid LAD stenosis, circumflex 80% stenosis. There was a moderate size branching obtuse marginal with 90-95% stenosis in the inferior branch. The right coronary artery had proximal 30% stenosis. Posterior lateral was moderate-sized with 40% stenosis. The EF was 65-70%. He had a DES and  x 2 to the LAD and DES x 2 to the circumflex 03/08/12.    [name] GERD (gastroesophageal reflux disease)   . Emphysema 04/01/2012    By CT chest, august 2013  . CKD (chronic kidney disease), stage II     Past Surgical History  Procedure Laterality Date  . Inguinal herniorrhapy left  2002    x 2  . Rotator cuff repair      left  . Knee surgery      left  . S/p ventral surgery  2009  . Prostatectomy  2002    radical  . Right hip replacement  09/22/09  . Left hip replacement    . Eye surgery      right  . Percutaneous coronary stent intervention (pci-s) N/A 03/08/2012    Procedure: PERCUTANEOUS CORONARY STENT INTERVENTION (PCI-S);  Surgeon: Burnell Blanks, MD;  Location: [facility] CATH LAB;  Service: Cardiovascular;  Laterality: N/A;  . Video assisted thoracoscopy Right 03/22/2015    Procedure: RIGHT VIDEO ASSISTED THORACOSCOPY;  Surgeon: Melrose Nakayama, MD;  Location: [place];  Service: Thoracic;  Laterality: Right;  . Segmentecomy  Right 03/22/2015    Procedure: RIGHT LOWER LOBE SUPERIOR SEGMENTECTOMY;  Surgeon: Melrose Nakayama, MD;  Location: [place];  Service: Thoracic;  Laterality: Right;  . Lymph node dissection Right 03/22/2015    Procedure: LYMPH NODE DISSECTION;  Surgeon: Melrose Nakayama, MD;  Location: [place];  Service: Thoracic;  Laterality: Right;     Current Outpatient Prescriptions  Medication Sig Dispense Refill  . amLODipine (NORVASC) 10 MG tablet Take 1 tablet (10 mg total) by mouth daily. 90 tablet 3  . aspirin 81 MG EC tablet Take 81 mg by mouth daily.      . bicalutamide (CASODEX) 50 MG tablet 1 TAB DAILY FOR PROSTATE CANCER  3  . citalopram (CELEXA) 40 MG tablet TAKE 1 TABLET EVERY DAY 30 tablet 5  . cloNIDine (CATAPRES) 0.1 MG tablet TAKE 1 TABLET IN THE MORNING TAKE 2 TABLETS IN THE EVENING 90 tablet 1  . clopidogrel (PLAVIX) 75 MG tablet TAKE  1 TABLET EVERY DAY 30 tablet 5  . isosorbide mononitrate (IMDUR) 30 MG 24 hr tablet TAKE 1 TABLET (30 MG TOTAL) BY MOUTH DAILY. 30 tablet 11  . loratadine (CLARITIN) 10 MG tablet Take 10 mg by mouth daily.    [name] lovastatin (MEVACOR) 40 MG tablet Take 40 mg by mouth at bedtime.  9  . nitroGLYCERIN (NITROSTAT) 0.4 MG SL tablet Place 1 tablet (0.4 mg total) under the tongue every 5 (five) minutes as needed for chest pain. 25 tablet 6  . oxyCODONE (OXY IR/ROXICODONE) 5 MG immediate release tablet Take 1-2 tablets (5-10 mg total) by mouth every 6 (six) hours as needed for severe pain. 50 tablet 0  . pantoprazole (PROTONIX) 40 MG tablet TAKE 1 TABLET BY MOUTH DAILY 30 tablet 5  . pramipexole (MIRAPEX) 1 MG tablet TAKE 1 TABLET 3 TIMES A DAY FOR RESTLESS LEG 90 tablet 0   No current facility-administered medications for this visit.    Allergies:   Amitriptyline hcl; Diphenhydramine hcl; Simvastatin; and Tylenol    Social History:   Social History   Social History  . Marital Status: Married    Spouse Name: N/A  . Number of Children: 2  . Years of  Education: N/A   Occupational History  . truck driver-retired    Social History Main Topics  . Smoking status: Former Smoker -- 1.00 packs/day for 15 years    Types: Cigarettes    Quit date: 02/05/1977  . Smokeless tobacco: Never Used  . Alcohol Use: 0.5 oz/week    1 Standard drinks or equivalent per week     Comment: RARE  . Drug Use: No  . Sexual Activity: Not Currently   Other Topics Concern  . None   Social History Narrative     Family History:   Family History  Problem Relation Age of Onset  . Heart attack Father 1    Smoker  . Heart attack Brother 50  . Colon cancer Neg Hx   . Lung cancer Sister       ROS:   Please see the history of present illness.   Review of Systems  All other systems reviewed and are negative.     PHYSICAL EXAM: VS:  BP 168/60 mmHg  Pulse 67  Ht _0  (1.702 m)  Wt 148 lb 1.9 oz (67.187 kg)  BMI 23.19 kg/m2    Wt Readings from Last 3 Encounters:  07/21/15 148 lb 1.9 oz (67.187 kg)  06/22/15 150 lb (68.04 kg)  05/24/15 150 lb 6.4 oz (68.221 kg)     GEN: Well nourished, well developed, in no acute distress HEENT: normal Neck: no JVD,  no masses Cardiac:  Normal S1/S2, RRR; no murmur ,  no rubs or gallops, no edema   Respiratory:  clear to auscultation bilaterally, no wheezing, rhonchi or rales. GI: soft, nontender, nondistended, + BS MS: no deformity or atrophy Skin: warm and dry  Neuro:  CNs II-XII intact, Strength and sensation are intact Psych: Normal affect   EKG:  EKG is not ordered today.  It demonstrates:   n/a   Recent Labs: 10/20/2014: TSH 3.79 04/29/2015: ALT 7; BUN 25.2; Creatinine 1.5*; HGB 10.7*; Platelets 154; Potassium 4.2; Sodium 143    Lipid Panel    Component Value Date/Time   CHOL 133 10/20/2014 1013   TRIG 77.0 10/20/2014 1013   HDL 35.80* 10/20/2014 1013   CHOLHDL 4 10/20/2014 1013   VLDL 15.4 10/20/2014 1013   LDLCALC 82  10/20/2014 1013   LDLDIRECT 104.9 02/10/2011 1224      ASSESSMENT  AND PLAN:  1. Syncope:  Episode in question does not sound like true syncope.  He describes an episode of losing his balance/getting dizzy with standing quickly.  He remembers the entire episode.  It it not clear that he ever had syncope. His event monitor was unremarkable.  No further workup needed.    2. CAD:  S/p DES x 2 to the LAD and DES x 2 to the LCx.  He denies angina.  With extensive stenting, he should probably remain on long term dual antiplatelet therapy.  Continue ASA, Plavix, statin, nitrates.    3. HTN:  BP elevated.  His BP at home is always < 140/90.  Probable component of white coat HTN.  I have asked him to continue monitoring his BP at home and when to call.  4. Hyperlipidemia:  Continue statin.  5. Carotid Stenosis:  FU dopplers due in 4/17.     Medication Changes: Current medicines are reviewed at length with the patient today.  Concerns regarding medicines are as outlined above.  The following changes have been made:   Discontinued Medications   No medications on file   Modified Medications   No medications on file   New Prescriptions   No medications on file   Labs/ tests ordered today include:   No orders of the defined types were placed in this encounter.     Disposition:    FU with Dr. Lauree Chandler 3 mos.     Signed, Versie Starks, MHS 07/21/2015 12:11 PM    [facility] [place], [place],   [address] Phone: [phone]; Fax: [phone]

## 2015-07-21 ENCOUNTER — Ambulatory Visit (INDEPENDENT_AMBULATORY_CARE_PROVIDER_SITE_OTHER): Payer: Medicare Other | Admitting: Physician Assistant

## 2015-07-21 ENCOUNTER — Encounter: Payer: Self-pay | Admitting: Physician Assistant

## 2015-07-21 VITALS — BP 168/60 | HR 67 | Ht 67.0 in | Wt 148.1 lb

## 2015-07-21 DIAGNOSIS — R55 Syncope and collapse: Secondary | ICD-10-CM | POA: Diagnosis not present

## 2015-07-21 DIAGNOSIS — E785 Hyperlipidemia, unspecified: Secondary | ICD-10-CM

## 2015-07-21 DIAGNOSIS — I2510 Atherosclerotic heart disease of native coronary artery without angina pectoris: Secondary | ICD-10-CM

## 2015-07-21 DIAGNOSIS — I10 Essential (primary) hypertension: Secondary | ICD-10-CM

## 2015-07-21 NOTE — Patient Instructions (Addendum)
Medication Instructions:  No changes  Labwork: None today  Testing/Procedures: None   Follow-Up: Dr. Lauree Chandler in 2-3 months.   Any Other Special Instructions Will Be Listed Below (If Applicable).  Check your blood pressure a few times a week. If your blood pressure is consistently greater than 140/90, call us.

## 2015-08-17 DIAGNOSIS — H1132 Conjunctival hemorrhage, left eye: Secondary | ICD-10-CM | POA: Diagnosis not present

## 2015-08-24 ENCOUNTER — Telehealth: Payer: Self-pay | Admitting: Internal Medicine

## 2015-08-24 NOTE — Telephone Encounter (Signed)
Pt called regarding Medical Supply sent a request over for pts diabetic supplies last week.  He states they have not received response from [place] yet.

## 2015-08-26 NOTE — Telephone Encounter (Signed)
Un-named supply company is requesting DME and DM supplies on pt's behalf. Company advised that pt will need to request orders from MD

## 2015-08-27 DIAGNOSIS — C61 Malignant neoplasm of prostate: Secondary | ICD-10-CM | POA: Diagnosis not present

## 2015-08-29 DIAGNOSIS — C61 Malignant neoplasm of prostate: Secondary | ICD-10-CM | POA: Diagnosis not present

## 2015-09-01 ENCOUNTER — Telehealth: Payer: Self-pay

## 2015-09-01 ENCOUNTER — Other Ambulatory Visit: Payer: Self-pay | Admitting: Cardiovascular Disease

## 2015-09-01 ENCOUNTER — Telehealth: Payer: Self-pay | Admitting: Internal Medicine

## 2015-09-01 NOTE — Telephone Encounter (Signed)
Stacy from [facility] called to see if you received the fax for pts external caths she sent in on Friday She can be reached at [phone]

## 2015-09-01 NOTE — Telephone Encounter (Signed)
See previous note

## 2015-09-01 NOTE — Telephone Encounter (Signed)
DME [facility] [employer] faxed over request for catheter supplies and per pt, these are needed. Pt advised that DME company is out-of-network (located in [facility]) but he can be referred to Urology if needed, Pt states that he has appt with Urology 09/02/2015 and will discuss there.

## 2015-09-02 DIAGNOSIS — C61 Malignant neoplasm of prostate: Secondary | ICD-10-CM | POA: Diagnosis not present

## 2015-09-02 DIAGNOSIS — N5201 Erectile dysfunction due to arterial insufficiency: Secondary | ICD-10-CM | POA: Diagnosis not present

## 2015-09-02 DIAGNOSIS — R32 Unspecified urinary incontinence: Secondary | ICD-10-CM | POA: Diagnosis not present

## 2015-09-02 DIAGNOSIS — N393 Stress incontinence (female) (male): Secondary | ICD-10-CM | POA: Diagnosis not present

## 2015-09-02 DIAGNOSIS — Z0000 Encounter for general adult medical examination without abnormal findings: Secondary | ICD-10-CM | POA: Diagnosis not present

## 2015-09-02 DIAGNOSIS — N281 Cyst of kidney, acquired: Secondary | ICD-10-CM | POA: Diagnosis not present

## 2015-09-12 ENCOUNTER — Other Ambulatory Visit: Payer: Self-pay | Admitting: Internal Medicine

## 2015-10-01 ENCOUNTER — Other Ambulatory Visit: Payer: Self-pay | Admitting: Internal Medicine

## 2015-10-08 ENCOUNTER — Ambulatory Visit (INDEPENDENT_AMBULATORY_CARE_PROVIDER_SITE_OTHER): Payer: Medicare Other | Admitting: Internal Medicine

## 2015-10-08 ENCOUNTER — Other Ambulatory Visit (INDEPENDENT_AMBULATORY_CARE_PROVIDER_SITE_OTHER): Payer: Medicare Other

## 2015-10-08 ENCOUNTER — Encounter: Payer: Self-pay | Admitting: Internal Medicine

## 2015-10-08 VITALS — BP 136/68 | HR 63 | Temp 98.0°F | Ht 67.0 in | Wt 153.0 lb

## 2015-10-08 DIAGNOSIS — T148 Other injury of unspecified body region: Secondary | ICD-10-CM

## 2015-10-08 DIAGNOSIS — G3184 Mild cognitive impairment, so stated: Secondary | ICD-10-CM

## 2015-10-08 DIAGNOSIS — Z0000 Encounter for general adult medical examination without abnormal findings: Secondary | ICD-10-CM

## 2015-10-08 DIAGNOSIS — T07XXXA Unspecified multiple injuries, initial encounter: Secondary | ICD-10-CM | POA: Insufficient documentation

## 2015-10-08 DIAGNOSIS — G2581 Restless legs syndrome: Secondary | ICD-10-CM | POA: Diagnosis not present

## 2015-10-08 DIAGNOSIS — I10 Essential (primary) hypertension: Secondary | ICD-10-CM | POA: Diagnosis not present

## 2015-10-08 DIAGNOSIS — F0390 Unspecified dementia without behavioral disturbance: Secondary | ICD-10-CM | POA: Insufficient documentation

## 2015-10-08 LAB — LIPID PANEL
CHOLESTEROL: 116 mg/dL (ref 0–200)
HDL: 30 mg/dL — ABNORMAL LOW (ref >39.00)
LDL Cholesterol: 47 mg/dL (ref 0–99)
NonHDL: 85.89
TRIGLYCERIDES: 194 mg/dL — AB (ref 0.0–149.0)
Total CHOL/HDL Ratio: 4
VLDL: 38.8 mg/dL (ref 0.0–40.0)

## 2015-10-08 LAB — TSH: TSH: 1.62 u[IU]/mL (ref 0.35–4.50)

## 2015-10-08 LAB — VITAMIN B12: VITAMIN B 12: 148 pg/mL — AB (ref 211–911)

## 2015-10-08 MED ORDER — GABAPENTIN 300 MG PO CAPS: ORAL_CAPSULE | ORAL | Status: DC

## 2015-10-08 MED ORDER — GABAPENTIN 100 MG PO CAPS: 100.0000 mg | ORAL_CAPSULE | Freq: Two times a day (BID) | ORAL | Status: DC

## 2015-10-08 MED ORDER — DONEPEZIL HCL 5 MG PO TABS: 5.0000 mg | ORAL_TABLET | Freq: Every day | ORAL | Status: DC

## 2015-10-08 NOTE — Assessment & Plan Note (Signed)
Mild, for aricept 5 qd,  to f/u any worsening symptoms or concerns

## 2015-10-08 NOTE — Progress Notes (Signed)
Pre visit review using our clinic review tool, if applicable. No additional management support is needed unless otherwise documented below in the visit note. 

## 2015-10-08 NOTE — Assessment & Plan Note (Signed)

## 2015-10-08 NOTE — Assessment & Plan Note (Signed)
Ok for change to gabapentin asd,  to f/u any worsening symptoms or concerns

## 2015-10-08 NOTE — Patient Instructions (Addendum)
Please take all new medication as prescribed - the gabapentin at 100 mg twice during the day, then 300 mg at night; this can be increased to 300 mg three times per day in a few weeks if you need more  Please take all new medication as prescribed - the aricept 5 mg for memory  Please continue all other medications as before, and refills have been done if requested.  Please have the pharmacy call with any other refills you may need.  Please continue your efforts at being more active, low cholesterol diet, and weight control.  You are otherwise up to date with prevention measures today.  Please keep your appointments with your specialists as you may have planned  Please go to the LAB in the Basement (turn left off the elevator) for the tests to be done today  You will be contacted by phone if any changes need to be made immediately.  Otherwise, you will receive a letter about your results with an explanation, but please check with MyChart first.  Please remember to sign up for MyChart if you have not done so, as this will be important to you in the future with finding out test results, communicating by private email, and scheduling acute appointments online when needed.  Please return in 6 months, or sooner if needed, with Lab testing done 3-5 days before  OK to cancel your appt fo Oct 21, 2015 with me

## 2015-10-08 NOTE — Assessment & Plan Note (Signed)
stable overall by history and exam, recent data reviewed with pt, and pt to continue medical treatment as before,  to f/u any worsening symptoms or concerns  

## 2015-10-08 NOTE — Assessment & Plan Note (Signed)
S/p accidental fall, mild and healing,  to f/u any worsening symptoms or concerns, reassured

## 2015-10-08 NOTE — Progress Notes (Signed)
Subjective:    Patient ID: Victor Davis, male    DOB: 1941-06-17, 75 y.o.   MRN: [id number]  HPI  Here for wellness and f/u;  Overall doing ok;  Pt denies Chest pain, worsening SOB, DOE, wheezing, orthopnea, PND, worsening LE edema, palpitations, dizziness or syncope.  Pt denies neurological change such as new headache, facial or extremity weakness.  Pt denies polydipsia, polyuria, or low sugar symptoms. Pt states overall good compliance with treatment and medications, good tolerability, and has been trying to follow appropriate diet.  Pt denies worsening depressive symptoms, suicidal ideation or panic. No fever, night sweats, wt loss, loss of appetite, or other constitutional symptoms.  Pt states good ability with ADL's, has low fall risk, home safety reviewed and adequate, no other significant changes in hearing or vision, does go to gym nearly daily for some activity, helped by wife. Has also fall x1 wk ago, tripped over shoes, left lower face struck doorknow, has bruise to left lower face, also right buttock/hip, right distal foot/toes, and left medial > lateral foot/ankle.  Wife reports mild memory worsening recently, asks for tx, has signfiicant persistent lapses in ST memory. Pt c/o RLS not enough improved with the mirapax, has already tried other, but not gabapentin. Also mentions being followed with PSA increasing. Past Medical History  Diagnosis Date  . BRADYCARDIA 10/24/2010  . CALF PAIN, RIGHT 10/22/2009  . 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 Dysuria 10/10/2010  . ERECTILE DYSFUNCTION 04/05/2007  . FATIGUE 10/02/2007  . GLUCOSE INTOLERANCE 07/15/2008  . HYPERLIPIDEMIA 07/30/2008  . HYPERSOMNIA 10/02/2007  . INSOMNIA-SLEEP DISORDER-UNSPEC 10/22/2009  . LOW BACK PAIN 04/05/2007  . OSTEOARTHRITIS 04/05/2007  . PEPTIC ULCER DISEASE 04/05/2007  . PERIPHERAL EDEMA 10/22/2009  . PERIPHERAL VASCULAR DISEASE 07/30/2008  . PROSTATE CANCER, HX OF 04/05/2007  . RESTLESS LEG SYNDROME 05/15/2007  .  RHINITIS, ALLERGIC NOS 05/15/2007  . SHOULDER PAIN, LEFT 01/22/2008  . SINUSITIS- ACUTE-NOS 05/15/2007  . Unspecified essential hypertension 04/05/2007  . Unspecified visual loss 07/15/2008  . UTI 10/24/2010  . Impaired glucose tolerance 02/09/2011  . Coronary artery disease      95% followed by 80% mid LAD stenosis, circumflex 80% stenosis. There was a moderate size branching obtuse marginal with 90-95% stenosis in the inferior branch. The right coronary artery had proximal 30% stenosis. Posterior lateral was moderate-sized with 40% stenosis. The EF was 65-70%. He had a DES and  x 2 to the LAD and DES x 2 to the circumflex 03/08/12.    [name] GERD (gastroesophageal reflux disease)   . Emphysema 04/01/2012    By CT chest, august 2013  . CKD (chronic kidney disease), stage II    Past Surgical History  Procedure Laterality Date  . Inguinal herniorrhapy left  2002    x 2  . Rotator cuff repair      left  . Knee surgery      left  . S/p ventral surgery  2009  . Prostatectomy  2002    radical  . Right hip replacement  09/22/09  . Left hip replacement    . Eye surgery      right  . Percutaneous coronary stent intervention (pci-s) N/A 03/08/2012    Procedure: PERCUTANEOUS CORONARY STENT INTERVENTION (PCI-S);  Surgeon: Burnell Blanks, MD;  Location: [facility] CATH LAB;  Service: Cardiovascular;  Laterality: N/A;  . Video assisted thoracoscopy Right 03/22/2015    Procedure: RIGHT VIDEO ASSISTED THORACOSCOPY;  Surgeon: Melrose Nakayama, MD;  Location: [facility]  OR;  Service: Thoracic;  Laterality: Right;  . Segmentecomy Right 03/22/2015    Procedure: RIGHT LOWER LOBE SUPERIOR SEGMENTECTOMY;  Surgeon: Melrose Nakayama, MD;  Location: [place];  Service: Thoracic;  Laterality: Right;  . Lymph node dissection Right 03/22/2015    Procedure: LYMPH NODE DISSECTION;  Surgeon: Melrose Nakayama, MD;  Location: [place];  Service: Thoracic;  Laterality: Right;    reports that he quit smoking about 38 years ago. His  smoking use included Cigarettes. He has a 15 pack-year smoking history. He has never used smokeless tobacco. He reports that he drinks about 0.5 oz of alcohol per week. He reports that he does not use illicit drugs. family history includes Heart attack (age of onset: 38) in his father; Heart attack (age of onset: 51) in his brother; Lung cancer in his sister. There is no history of Colon cancer. Allergies  Allergen Reactions  . Amitriptyline Hcl Other (See Comments)    REACTION: confusion  . Diphenhydramine Hcl Other (See Comments)    Keeps him awake  . Simvastatin Other (See Comments)    REACTION: leg pain  . Tylenol [Acetaminophen] Other (See Comments)    Dr advised pt not to take due to finding a spot on pt's kidney   Current Outpatient Prescriptions on File Prior to Visit  Medication Sig Dispense Refill  . amLODipine (NORVASC) 10 MG tablet Take 1 tablet (10 mg total) by mouth daily. 90 tablet 3  . aspirin 81 MG EC tablet Take 81 mg by mouth daily.      . citalopram (CELEXA) 40 MG tablet TAKE 1 TABLET EVERY DAY 30 tablet 5  . cloNIDine (CATAPRES) 0.1 MG tablet TAKE 1 TABLET IN THE MORNING TAKE 2 TABLETS IN THE EVENING 90 tablet 5  . clopidogrel (PLAVIX) 75 MG tablet TAKE 1 TABLET EVERY DAY 30 tablet 5  . isosorbide mononitrate (IMDUR) 30 MG 24 hr tablet TAKE 1 TABLET (30 MG TOTAL) BY MOUTH DAILY. 30 tablet 11  . loratadine (CLARITIN) 10 MG tablet Take 10 mg by mouth daily.    [name] lovastatin (MEVACOR) 40 MG tablet Take 40 mg by mouth at bedtime.  9  . NITROSTAT 0.4 MG SL tablet PLACE 1 TABLET (0.4 MG TOTAL) UNDER THE TONGUE EVERY 5 (FIVE) MINUTES AS NEEDED FOR CHEST PAIN. 25 tablet 5  . oxyCODONE (OXY IR/ROXICODONE) 5 MG immediate release tablet Take 1-2 tablets (5-10 mg total) by mouth every 6 (six) hours as needed for severe pain. 50 tablet 0  . pantoprazole (PROTONIX) 40 MG tablet TAKE 1 TABLET BY MOUTH DAILY 30 tablet 5  . pramipexole (MIRAPEX) 1 MG tablet Take 1 tablet (1 mg total) by  mouth 3 (three) times daily. 180 tablet 1   No current facility-administered medications on file prior to visit.    Review of Systems Constitutional: Negative for increased diaphoresis, other activity, appetite or siginficant weight change other than noted HENT: Negative for worsening hearing loss, ear pain, facial swelling, mouth sores and neck stiffness.   Eyes: Negative for other worsening pain, redness or visual disturbance.  Respiratory: Negative for shortness of breath and wheezing  Cardiovascular: Negative for chest pain and palpitations.  Gastrointestinal: Negative for diarrhea, blood in stool, abdominal distention or other pain Genitourinary: Negative for hematuria, flank pain or change in urine volume.  Musculoskeletal: Negative for myalgias or other joint complaints.  Skin: Negative for color change and wound or drainage.  Neurological: Negative for syncope and numbness. other than noted Hematological:  Negative for adenopathy. or other swelling Psychiatric/Behavioral: Negative for hallucinations, SI, self-injury, decreased concentration or other worsening agitation.      Objective:   Physical Exam BP 136/68 mmHg  Pulse 63  Temp(Src) 98 F (36.7 C) (Oral)  Ht '5\' 7"'$  (1.702 m)  Wt 153 lb (69.4 kg)  BMI 23.96 kg/m2  SpO2 98% VS noted,  Constitutional: Pt is oriented to person, place, and time. Appears well-developed and well-nourished, in no significant distress Head: Normocephalic and atraumatic.  Right Ear: External ear normal.  Left Ear: External ear normal.  Nose: Nose normal.  Mouth/Throat: Oropharynx is clear and moist.  Eyes: Conjunctivae and EOM are normal. Pupils are equal, round, and reactive to light.  Neck: Normal range of motion. Neck supple. No JVD present. No tracheal deviation present or significant neck LA or mass Cardiovascular: Normal rate, regular rhythm, normal heart sounds and intact distal pulses.   Pulmonary/Chest: Effort normal and breath sounds  without rales or wheezing  Abdominal: Soft. Bowel sounds are normal. NT. No HSM  Musculoskeletal: Normal range of motion. Exhibits no edema.  Lymphadenopathy:  Has no cervical adenopathy.  Neurological: Pt is alert and oriented to person, place, has some difficulty with date. Pt has normal reflexes. No cranial nerve deficit. Motor grossly intact Skin: Skin is warm and dry. No rash noted. Has mult bruises minor tender to left lower face, right lateral buttock, right distal foot and toes, left medial and lateral ankles Psychiatric:  Has normal mood and affect. Behavior is normal.         Assessment & Plan:

## 2015-10-14 NOTE — Progress Notes (Signed)
Chief Complaint  Patient presents with  . Follow-up     History of Present Illness: 75 yo male with history of CAD, CVA, HTN, HLD, PAD, prostate CA who is here today for cardiac follow up. He was seen as a new patient 02/06/12 for evaluation of abnormal chest CT. His chest CT showed calcification of the aorta and coronary arteries. He described occasional chest pains that occurred at rest and with exertion. LHC 02/12/12 showed severe LAD and Circumflex disease. PCI delayed because of renal insufficiency and while he was having a renal cyst workup. He was brought back on 03/08/12 and I placed 2 DES in the LAD and 2 DES in the Circumflex. He did well following the PCI. Admitted to [facility] August 2016 for resection of right lung adenocarcinoma. Carotid dopplers with 40-59% bilateral stenosis April 2016. Possible syncopal event October 2016. Cardiac event monitor showed rare PVCs but no other arrhythmias and no high grade AV block.   He is here today for follow up. No exertional chest pains. His breathing is at baseline. He is feeling well overall. Rare dizziness.   Primary Care Physician: Cathlean Cower  Past Medical History  Diagnosis Date  . BRADYCARDIA 10/24/2010  . CALF PAIN, RIGHT 10/22/2009  . 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 Dysuria 10/10/2010  . ERECTILE DYSFUNCTION 04/05/2007  . FATIGUE 10/02/2007  . GLUCOSE INTOLERANCE 07/15/2008  . HYPERLIPIDEMIA 07/30/2008  . HYPERSOMNIA 10/02/2007  . INSOMNIA-SLEEP DISORDER-UNSPEC 10/22/2009  . LOW BACK PAIN 04/05/2007  . OSTEOARTHRITIS 04/05/2007  . PEPTIC ULCER DISEASE 04/05/2007  . PERIPHERAL EDEMA 10/22/2009  . PERIPHERAL VASCULAR DISEASE 07/30/2008  . PROSTATE CANCER, HX OF 04/05/2007  . RESTLESS LEG SYNDROME 05/15/2007  . RHINITIS, ALLERGIC NOS 05/15/2007  . SHOULDER PAIN, LEFT 01/22/2008  . SINUSITIS- ACUTE-NOS 05/15/2007  . Unspecified essential hypertension 04/05/2007  . Unspecified visual loss 07/15/2008  . UTI 10/24/2010  . Impaired glucose  tolerance 02/09/2011  . Coronary artery disease      95% followed by 80% mid LAD stenosis, circumflex 80% stenosis. There was a moderate size branching obtuse marginal with 90-95% stenosis in the inferior branch. The right coronary artery had proximal 30% stenosis. Posterior lateral was moderate-sized with 40% stenosis. The EF was 65-70%. He had a DES and  x 2 to the LAD and DES x 2 to the circumflex 03/08/12.    [name] GERD (gastroesophageal reflux disease)   . Emphysema 04/01/2012    By CT chest, august 2013  . CKD (chronic kidney disease), stage II     Past Surgical History  Procedure Laterality Date  . Inguinal herniorrhapy left  2002    x 2  . Rotator cuff repair      left  . Knee surgery      left  . S/p ventral surgery  2009  . Prostatectomy  2002    radical  . Right hip replacement  09/22/09  . Left hip replacement    . Eye surgery      right  . Percutaneous coronary stent intervention (pci-s) N/A 03/08/2012    Procedure: PERCUTANEOUS CORONARY STENT INTERVENTION (PCI-S);  Surgeon: Burnell Blanks, MD;  Location: [facility] CATH LAB;  Service: Cardiovascular;  Laterality: N/A;  . Video assisted thoracoscopy Right 03/22/2015    Procedure: RIGHT VIDEO ASSISTED THORACOSCOPY;  Surgeon: Melrose Nakayama, MD;  Location: [place];  Service: Thoracic;  Laterality: Right;  . Segmentecomy Right 03/22/2015    Procedure: RIGHT LOWER LOBE SUPERIOR SEGMENTECTOMY;  Surgeon: Revonda Standard  Roxan Hockey, MD;  Location: [place];  Service: Thoracic;  Laterality: Right;  . Lymph node dissection Right 03/22/2015    Procedure: LYMPH NODE DISSECTION;  Surgeon: Melrose Nakayama, MD;  Location: [place];  Service: Thoracic;  Laterality: Right;    Current Outpatient Prescriptions  Medication Sig Dispense Refill  . amLODipine (NORVASC) 10 MG tablet Take 1 tablet (10 mg total) by mouth daily. 90 tablet 3  . aspirin 81 MG EC tablet Take 81 mg by mouth daily.      . citalopram (CELEXA) 40 MG tablet TAKE 1 TABLET EVERY DAY 30  tablet 5  . cloNIDine (CATAPRES) 0.1 MG tablet TAKE 1 TABLET BY MOUTH  IN THE MORNING TAKE 2 TABLETS IN THE EVENING    . clopidogrel (PLAVIX) 75 MG tablet Take 75 mg by mouth daily.    . Cyanocobalamin (VITAMIN B-12 PO) Take 1 tablet by mouth daily.    [name] donepezil (ARICEPT) 5 MG tablet Take 1 tablet (5 mg total) by mouth at bedtime. 90 tablet 3  . enzalutamide (XTANDI) 40 MG capsule Take 80 mg by mouth daily.    [name] gabapentin (NEURONTIN) 100 MG capsule Take 1 capsule (100 mg total) by mouth 2 (two) times daily. 180 capsule 1  . gabapentin (NEURONTIN) 300 MG capsule 1 by mouth at bedtime, in addition to the prior daytime dosing as well 90 capsule 1  . GuaiFENesin (MUCINEX PO) Take 1 tablet by mouth every 12 (twelve) hours as needed (cold symptoms).    . isosorbide mononitrate (IMDUR) 30 MG 24 hr tablet TAKE 1 TABLET (30 MG TOTAL) BY MOUTH DAILY. 30 tablet 11  . loratadine (CLARITIN) 10 MG tablet Take 10 mg by mouth daily.    [name] lovastatin (MEVACOR) 40 MG tablet Take 40 mg by mouth at bedtime.  9  . NITROSTAT 0.4 MG SL tablet PLACE 1 TABLET (0.4 MG TOTAL) UNDER THE TONGUE EVERY 5 (FIVE) MINUTES AS NEEDED FOR CHEST PAIN. 25 tablet 5  . oxyCODONE (OXY IR/ROXICODONE) 5 MG immediate release tablet Take 1-2 tablets (5-10 mg total) by mouth every 6 (six) hours as needed for severe pain. 50 tablet 0  . pantoprazole (PROTONIX) 40 MG tablet TAKE 1 TABLET BY MOUTH DAILY 30 tablet 5  . pramipexole (MIRAPEX) 1 MG tablet Take 1 tablet (1 mg total) by mouth 3 (three) times daily. 180 tablet 1   No current facility-administered medications for this visit.    Allergies  Allergen Reactions  . Amitriptyline Hcl Other (See Comments)    REACTION: confusion  . Diphenhydramine Hcl Other (See Comments)    Keeps him awake  . Simvastatin Other (See Comments)    REACTION: leg pain  . Tylenol [Acetaminophen] Other (See Comments)    Dr advised pt not to take due to finding a spot on pt's kidney    Social History    Social History  . Marital Status: Married    Spouse Name: N/A  . Number of Children: 2  . Years of Education: N/A   Occupational History  . truck driver-retired    Social History Main Topics  . Smoking status: Former Smoker -- 1.00 packs/day for 15 years    Types: Cigarettes    Quit date: 02/05/1977  . Smokeless tobacco: Never Used  . Alcohol Use: 0.5 oz/week    1 Standard drinks or equivalent per week     Comment: RARE  . Drug Use: No  . Sexual Activity: Not Currently   Other Topics Concern  .  Not on file   Social History Narrative    Family History  Problem Relation Age of Onset  . Heart attack Father 64    Smoker  . Heart attack Brother 63  . Colon cancer Neg Hx   . Lung cancer Sister     Review of Systems:  As stated in the HPI and otherwise negative.   BP 138/70 mmHg  Pulse 62  Ht '5\' 7"'$  (1.702 m)  Wt 155 lb 12.8 oz (70.67 kg)  BMI 24.40 kg/m2  SpO2 93%  Physical Examination: General: Well developed, well nourished, NAD HEENT: OP clear, mucus membranes moist SKIN: warm, dry. No rashes. Neuro: No focal deficits Musculoskeletal: Muscle strength 5/5 all ext Psychiatric: Mood and affect normal Neck: No JVD, no carotid bruits, no thyromegaly, no lymphadenopathy. Lungs:Clear bilaterally, no wheezes, rhonci, crackles Cardiovascular: Regular rate and rhythm. No murmurs, gallops or rubs. Abdomen:Soft. Bowel sounds present. Non-tender.  Extremities: No lower extremity edema. Pulses are 2 + in the bilateral DP/PT.  EKG:  EKG is not ordered today. The ekg ordered today demonstrates   Recent Labs: 04/29/2015: ALT 7; BUN 25.2; Creatinine 1.5*; HGB 10.7*; Platelets 154; Potassium 4.2; Sodium 143 10/08/2015: TSH 1.62   Lipid Panel    Component Value Date/Time   CHOL 116 10/08/2015 1610   TRIG 194.0* 10/08/2015 1610   HDL 30.00* 10/08/2015 1610   CHOLHDL 4 10/08/2015 1610   VLDL 38.8 10/08/2015 1610   LDLCALC 47 10/08/2015 1610   LDLDIRECT 104.9  02/10/2011 1224     Wt Readings from Last 3 Encounters:  10/15/15 155 lb 12.8 oz (70.67 kg)  10/08/15 153 lb (69.4 kg)  07/21/15 148 lb 1.9 oz (67.187 kg)     Other studies Reviewed: Additional studies/ records that were reviewed today include: . Review of the above records demonstrates:   Assessment and Plan:   1. CAD: Stable. Continue ASA and Plavix. No beta blocker with braydcardia.   2. HTN: BP is well controlled. Continue current meds.   3. HLD: Continue statin. Lipids well controlled. LDL at goal.   4. Carotid artery disease: Bilateral 40-59% stenosis by dopplers April 2016. Repeat April 2017.  Current medicines are reviewed at length with the patient today.  The patient does not have concerns regarding medicines.  The following changes have been made:  no change  Labs/ tests ordered today include:  No orders of the defined types were placed in this encounter.     Disposition:   FU with me in 12 months   Signed, Lauree Chandler, MD 10/15/2015 8:55 AM    [facility] [address], [place], [place]  [address] Phone: [phone]; Fax: [phone]

## 2015-10-15 ENCOUNTER — Ambulatory Visit (INDEPENDENT_AMBULATORY_CARE_PROVIDER_SITE_OTHER): Payer: Medicare Other | Admitting: Cardiovascular Disease

## 2015-10-15 ENCOUNTER — Encounter: Payer: Self-pay | Admitting: Cardiovascular Disease

## 2015-10-15 VITALS — BP 138/70 | HR 62 | Ht 67.0 in | Wt 155.8 lb

## 2015-10-15 DIAGNOSIS — I6523 Occlusion and stenosis of bilateral carotid arteries: Secondary | ICD-10-CM | POA: Diagnosis not present

## 2015-10-15 DIAGNOSIS — I2510 Atherosclerotic heart disease of native coronary artery without angina pectoris: Secondary | ICD-10-CM | POA: Diagnosis not present

## 2015-10-15 DIAGNOSIS — E785 Hyperlipidemia, unspecified: Secondary | ICD-10-CM

## 2015-10-15 DIAGNOSIS — I10 Essential (primary) hypertension: Secondary | ICD-10-CM

## 2015-10-15 NOTE — Patient Instructions (Signed)
Medication Instructions:  Your physician recommends that you continue on your current medications as directed. Please refer to the Current Medication list given to you today.   Labwork: none  Testing/Procedures: Your physician has requested that you have a carotid duplex. This test is an ultrasound of the carotid arteries in your neck. It looks at blood flow through these arteries that supply the brain with blood. Allow one hour for this exam. There are no restrictions or special instructions.  To be done in April    Follow-Up: Your physician wants you to follow-up in: 12 months.  You will receive a reminder letter in the mail two months in advance. If you don't receive a letter, please call our office to schedule the follow-up appointment.   Any Other Special Instructions Will Be Listed Below (If Applicable).     If you need a refill on your cardiac medications before your next appointment, please call your pharmacy.

## 2015-10-19 ENCOUNTER — Other Ambulatory Visit: Payer: Self-pay | Admitting: Internal Medicine

## 2015-10-21 ENCOUNTER — Ambulatory Visit: Payer: Medicare Other | Admitting: Internal Medicine

## 2015-10-26 ENCOUNTER — Other Ambulatory Visit: Payer: Self-pay | Admitting: Medical Oncology

## 2015-10-26 DIAGNOSIS — C3491 Malignant neoplasm of unspecified part of right bronchus or lung: Secondary | ICD-10-CM

## 2015-10-27 ENCOUNTER — Encounter (HOSPITAL_COMMUNITY): Payer: Self-pay

## 2015-10-27 ENCOUNTER — Ambulatory Visit (HOSPITAL_COMMUNITY)
Admission: RE | Admit: 2015-10-27 | Discharge: 2015-10-27 | Disposition: A | Discharge status: Home / Self Care | Payer: Medicare Other | Source: Ambulatory Visit | Attending: Internal Medicine | Admitting: Internal Medicine

## 2015-10-27 ENCOUNTER — Other Ambulatory Visit (HOSPITAL_BASED_OUTPATIENT_CLINIC_OR_DEPARTMENT_OTHER): Payer: Medicare Other

## 2015-10-27 DIAGNOSIS — C3491 Malignant neoplasm of unspecified part of right bronchus or lung: Secondary | ICD-10-CM | POA: Diagnosis not present

## 2015-10-27 DIAGNOSIS — C3431 Malignant neoplasm of lower lobe, right bronchus or lung: Secondary | ICD-10-CM | POA: Diagnosis not present

## 2015-10-27 DIAGNOSIS — Z902 Acquired absence of lung [part of]: Secondary | ICD-10-CM | POA: Diagnosis not present

## 2015-10-27 DIAGNOSIS — I700 Atherosclerosis of aorta: Secondary | ICD-10-CM | POA: Diagnosis not present

## 2015-10-27 DIAGNOSIS — I2510 Atherosclerotic heart disease of native coronary artery without angina pectoris: Secondary | ICD-10-CM | POA: Diagnosis not present

## 2015-10-27 LAB — COMPREHENSIVE METABOLIC PANEL WITH GFR
ALT: <9 U/L (ref 0–55)
AST: 12 U/L (ref 5–34)
Albumin: 3.5 g/dL (ref 3.5–5.0)
Alkaline Phosphatase: 90 U/L (ref 40–150)
Anion Gap: 7 meq/L (ref 3–11)
BUN: 23.1 mg/dL (ref 7.0–26.0)
CO2: 26 meq/L (ref 22–29)
Calcium: 9.2 mg/dL (ref 8.4–10.4)
Chloride: 107 meq/L (ref 98–109)
Creatinine: 1.7 mg/dL — ABNORMAL HIGH (ref 0.7–1.3)
EGFR: 38 ml/min/1.73 m2 — ABNORMAL LOW
Glucose: 107 mg/dL (ref 70–140)
Potassium: 4.1 meq/L (ref 3.5–5.1)
Sodium: 140 meq/L (ref 136–145)
Total Bilirubin: 0.7 mg/dL (ref 0.20–1.20)
Total Protein: 6.6 g/dL (ref 6.4–8.3)

## 2015-10-27 LAB — CBC WITH DIFFERENTIAL/PLATELET
BASO%: 0.5 % (ref 0.0–2.0)
Basophils Absolute: 0 10e3/uL (ref 0.0–0.1)
EOS%: 1.2 % (ref 0.0–7.0)
Eosinophils Absolute: 0.1 10e3/uL (ref 0.0–0.5)
HCT: 32.9 % — ABNORMAL LOW (ref 38.4–49.9)
HGB: 10.4 g/dL — ABNORMAL LOW (ref 13.0–17.1)
LYMPH%: 21.1 % (ref 14.0–49.0)
MCH: 26.5 pg — ABNORMAL LOW (ref 27.2–33.4)
MCHC: 31.7 g/dL — ABNORMAL LOW (ref 32.0–36.0)
MCV: 83.5 fL (ref 79.3–98.0)
MONO#: 0.8 10e3/uL (ref 0.1–0.9)
MONO%: 9.5 % (ref 0.0–14.0)
NEUT#: 5.9 10e3/uL (ref 1.5–6.5)
NEUT%: 67.7 % (ref 39.0–75.0)
Platelets: 209 10e3/uL (ref 140–400)
RBC: 3.94 10e6/uL — ABNORMAL LOW (ref 4.20–5.82)
RDW: 14.6 % (ref 11.0–14.6)
WBC: 8.7 10e3/uL (ref 4.0–10.3)
lymph#: 1.8 10e3/uL (ref 0.9–3.3)

## 2015-11-03 ENCOUNTER — Telehealth: Payer: Self-pay | Admitting: Internal Medicine

## 2015-11-03 ENCOUNTER — Encounter: Payer: Self-pay | Admitting: Internal Medicine

## 2015-11-03 ENCOUNTER — Ambulatory Visit (HOSPITAL_BASED_OUTPATIENT_CLINIC_OR_DEPARTMENT_OTHER): Payer: Medicare Other | Admitting: Internal Medicine

## 2015-11-03 VITALS — BP 121/73 | HR 76 | Temp 97.9°F | Resp 18 | Ht 67.0 in | Wt 145.7 lb

## 2015-11-03 DIAGNOSIS — C3431 Malignant neoplasm of lower lobe, right bronchus or lung: Secondary | ICD-10-CM | POA: Diagnosis not present

## 2015-11-03 DIAGNOSIS — D649 Anemia, unspecified: Secondary | ICD-10-CM | POA: Diagnosis not present

## 2015-11-03 DIAGNOSIS — C3491 Malignant neoplasm of unspecified part of right bronchus or lung: Secondary | ICD-10-CM

## 2015-11-03 DIAGNOSIS — R5383 Other fatigue: Secondary | ICD-10-CM

## 2015-11-03 NOTE — Progress Notes (Signed)
[place] Telephone:([phone]) [phone]   Fax:([phone]  OFFICE PROGRESS NOTE  Cathlean Cower, MD [place] [address]  DIAGNOSIS: Stage IA (T1a, N0, M0) non-small cell lung cancer, well-differentiated adenocarcinoma diagnosed in July 2016.  PRIOR THERAPY: Status post right lower lobe superior segmentectomy with lymph node sampling under the care of Dr. Roxan Hockey on 03/22/2015.  CURRENT THERAPY: Observation.  INTERVAL HISTORY: Victor Davis 75 y.o. male returns to the clinic today for six-month follow-up visit accompanied by his wife. The patient is feeling fine today except for fatigue after exercising at the gym. He denied having any significant chest pain but has shortness breath with exertion with no cough or hemoptysis. He has no fever or chills. He has no nausea or vomiting. The patient lost few pounds recently. He had repeat CT scan of the chest performed recently and he is here for evaluation and discussion of his scan results.  MEDICAL HISTORY: Past Medical History  Diagnosis Date  . BRADYCARDIA 10/24/2010  . CALF PAIN, RIGHT 10/22/2009  . 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 Dysuria 10/10/2010  . ERECTILE DYSFUNCTION 04/05/2007  . FATIGUE 10/02/2007  . GLUCOSE INTOLERANCE 07/15/2008  . HYPERLIPIDEMIA 07/30/2008  . HYPERSOMNIA 10/02/2007  . INSOMNIA-SLEEP DISORDER-UNSPEC 10/22/2009  . LOW BACK PAIN 04/05/2007  . OSTEOARTHRITIS 04/05/2007  . PEPTIC ULCER DISEASE 04/05/2007  . PERIPHERAL EDEMA 10/22/2009  . PERIPHERAL VASCULAR DISEASE 07/30/2008  . PROSTATE CANCER, HX OF 04/05/2007  . RESTLESS LEG SYNDROME 05/15/2007  . RHINITIS, ALLERGIC NOS 05/15/2007  . SHOULDER PAIN, LEFT 01/22/2008  . SINUSITIS- ACUTE-NOS 05/15/2007  . Unspecified essential hypertension 04/05/2007  . Unspecified visual loss 07/15/2008  . UTI 10/24/2010  . Impaired glucose tolerance 02/09/2011  . Coronary artery disease      95% followed by 80% mid LAD stenosis,  circumflex 80% stenosis. There was a moderate size branching obtuse marginal with 90-95% stenosis in the inferior branch. The right coronary artery had proximal 30% stenosis. Posterior lateral was moderate-sized with 40% stenosis. The EF was 65-70%. He had a DES and  x 2 to the LAD and DES x 2 to the circumflex 03/08/12.    [name] GERD (gastroesophageal reflux disease)   . Emphysema 04/01/2012    By CT chest, august 2013  . CKD (chronic kidney disease), stage II     ALLERGIES:  is allergic to amitriptyline hcl; diphenhydramine hcl; simvastatin; and tylenol.  MEDICATIONS:  Current Outpatient Prescriptions  Medication Sig Dispense Refill  . amLODipine (NORVASC) 10 MG tablet Take 1 tablet (10 mg total) by mouth daily. 90 tablet 3  . aspirin 81 MG EC tablet Take 81 mg by mouth daily.      . citalopram (CELEXA) 40 MG tablet TAKE 1 TABLET EVERY DAY 30 tablet 5  . cloNIDine (CATAPRES) 0.1 MG tablet TAKE 1 TABLET BY MOUTH  IN THE MORNING TAKE 2 TABLETS IN THE EVENING    . clopidogrel (PLAVIX) 75 MG tablet Take 75 mg by mouth daily.    . clopidogrel (PLAVIX) 75 MG tablet TAKE 1 TABLET EVERY DAY 30 tablet 5  . Cyanocobalamin (VITAMIN B-12 PO) Take 1 tablet by mouth daily.    [name] donepezil (ARICEPT) 5 MG tablet Take 1 tablet (5 mg total) by mouth at bedtime. 90 tablet 3  . enzalutamide (XTANDI) 40 MG capsule Take 80 mg by mouth daily.    [name] gabapentin (NEURONTIN) 100 MG capsule Take 1 capsule (100 mg total) by  mouth 2 (two) times daily. 180 capsule 1  . gabapentin (NEURONTIN) 300 MG capsule 1 by mouth at bedtime, in addition to the prior daytime dosing as well 90 capsule 1  . GuaiFENesin (MUCINEX PO) Take 1 tablet by mouth every 12 (twelve) hours as needed (cold symptoms).    . isosorbide mononitrate (IMDUR) 30 MG 24 hr tablet TAKE 1 TABLET (30 MG TOTAL) BY MOUTH DAILY. 30 tablet 11  . loratadine (CLARITIN) 10 MG tablet Take 10 mg by mouth daily.    [name] lovastatin (MEVACOR) 40 MG tablet Take 40 mg by mouth at  bedtime.  9  . NITROSTAT 0.4 MG SL tablet PLACE 1 TABLET (0.4 MG TOTAL) UNDER THE TONGUE EVERY 5 (FIVE) MINUTES AS NEEDED FOR CHEST PAIN. 25 tablet 5  . oxyCODONE (OXY IR/ROXICODONE) 5 MG immediate release tablet Take 1-2 tablets (5-10 mg total) by mouth every 6 (six) hours as needed for severe pain. 50 tablet 0  . pantoprazole (PROTONIX) 40 MG tablet TAKE 1 TABLET BY MOUTH DAILY 30 tablet 5  . pramipexole (MIRAPEX) 1 MG tablet Take 1 tablet (1 mg total) by mouth 3 (three) times daily. 180 tablet 1   No current facility-administered medications for this visit.    SURGICAL HISTORY:  Past Surgical History  Procedure Laterality Date  . Inguinal herniorrhapy left  2002    x 2  . Rotator cuff repair      left  . Knee surgery      left  . S/p ventral surgery  2009  . Prostatectomy  2002    radical  . Right hip replacement  09/22/09  . Left hip replacement    . Eye surgery      right  . Percutaneous coronary stent intervention (pci-s) N/A 03/08/2012    Procedure: PERCUTANEOUS CORONARY STENT INTERVENTION (PCI-S);  Surgeon: Burnell Blanks, MD;  Location: [facility] CATH LAB;  Service: Cardiovascular;  Laterality: N/A;  . Video assisted thoracoscopy Right 03/22/2015    Procedure: RIGHT VIDEO ASSISTED THORACOSCOPY;  Surgeon: Melrose Nakayama, MD;  Location: [place];  Service: Thoracic;  Laterality: Right;  . Segmentecomy Right 03/22/2015    Procedure: RIGHT LOWER LOBE SUPERIOR SEGMENTECTOMY;  Surgeon: Melrose Nakayama, MD;  Location: [place];  Service: Thoracic;  Laterality: Right;  . Lymph node dissection Right 03/22/2015    Procedure: LYMPH NODE DISSECTION;  Surgeon: Melrose Nakayama, MD;  Location: [place];  Service: Thoracic;  Laterality: Right;    REVIEW OF SYSTEMS:  A comprehensive review of systems was negative except for: Constitutional: positive for fatigue   PHYSICAL EXAMINATION: General appearance: alert, cooperative, fatigued and no distress Head: Normocephalic, without obvious  abnormality, atraumatic Neck: no adenopathy, no JVD, supple, symmetrical, trachea midline and thyroid not enlarged, symmetric, no tenderness/mass/nodules Lymph nodes: Cervical, supraclavicular, and axillary nodes normal. Resp: clear to auscultation bilaterally Back: symmetric, no curvature. ROM normal. No CVA tenderness. Cardio: regular rate and rhythm, S1, S2 normal, no murmur, click, rub or gallop GI: soft, non-tender; bowel sounds normal; no masses,  no organomegaly Extremities: extremities normal, atraumatic, no cyanosis or edema  ECOG PERFORMANCE STATUS: 1 - Symptomatic but completely ambulatory  Blood pressure 121/73, pulse 76, temperature 97.9 F (36.6 C), temperature source Oral, resp. rate 18, height '5\' 7"'$  (1.702 m), weight 145 lb 11.2 oz (66.089 kg), SpO2 100 %.  LABORATORY DATA: Lab Results  Component Value Date   WBC 8.7 10/27/2015   HGB 10.4* 10/27/2015   HCT 32.9* 10/27/2015  MCV 83.5 10/27/2015   PLT 209 10/27/2015      Chemistry      Component Value Date/Time   NA 140 10/27/2015 1330   NA 137 03/25/2015 0511   K 4.1 10/27/2015 1330   K 4.1 03/25/2015 0511   CL 104 03/25/2015 0511   CO2 26 10/27/2015 1330   CO2 27 03/25/2015 0511   BUN 23.1 10/27/2015 1330   BUN 14 03/25/2015 0511   CREATININE 1.7* 10/27/2015 1330   CREATININE 1.39* 03/25/2015 0511      Component Value Date/Time   CALCIUM 9.2 10/27/2015 1330   CALCIUM 8.7* 03/25/2015 0511   ALKPHOS 90 10/27/2015 1330   ALKPHOS 57 03/24/2015 0437   AST 12 10/27/2015 1330   AST 13* 03/24/2015 0437   ALT <9 10/27/2015 1330   ALT 9* 03/24/2015 0437   BILITOT 0.70 10/27/2015 1330   BILITOT 1.0 03/24/2015 0437       RADIOGRAPHIC STUDIES: Ct Chest Wo Contrast  10/27/2015  CLINICAL DATA:  Stage I non-small-cell carcinoma. Partial right lower lobectomy in November. Prostate cancer 15 years ago. Weight loss over the last 2 months. Ex-smoker. EXAM: CT CHEST WITHOUT CONTRAST TECHNIQUE: Multidetector CT  imaging of the chest was performed following the standard protocol without IV contrast. COMPARISON:  Plain film 06/22/2015.  Most recent CT of 02/23/2015. FINDINGS: Mediastinum/Nodes: Aortic and branch vessel atherosclerosis. Normal heart size, without pericardial effusion. Multivessel coronary artery atherosclerosis. No mediastinal or definite hilar adenopathy, given limitations of unenhanced CT. Lungs/Pleura: No pleural fluid. Partial right lower lobectomy. Right base atelectasis. No evidence of locally recurrent disease or new pulmonary nodule. Clear left lung. Upper abdomen: Normal imaged portions of the liver, spleen, stomach, pancreas, gallbladder, adrenal glands, kidneys. Abdominal aortic atherosclerosis. Colonic diverticulosis. Musculoskeletal: Right glenohumeral joint intra-articular loose bodies. IMPRESSION: 1. Status post partial right lower lobectomy. No residual/recurrent or metastatic disease. 2.  Atherosclerosis, including within the coronary arteries. Electronically Signed   By: Abigail Miyamoto M.D.   On: 10/27/2015 16:39    ASSESSMENT AND PLAN: This is a very pleasant 75 years old white male diagnosed with a stage IA non-small cell lung cancer, adenocarcinoma status post right lower lobectomy with lymph node sampling in August 2016. The patient is currently on observation and the recent CT scan of the chest showed no significant evidence for disease recurrence. I discussed the scan results with the patient and his wife. I recommended for him to continue on observation with repeat CT scan of the chest without contrast in 6 months. For the fatigue and persistent anemia, I advised the patient to start taking over-the-counter oral iron tablet twice a day. He was advised to call immediately if he has any concerning symptoms in the interval. The patient voices understanding of current disease status and treatment options and is in agreement with the current care plan.  All questions were answered.  The patient knows to call the clinic with any problems, questions or concerns. We can certainly see the patient much sooner if necessary.   Disclaimer: This note was dictated with voice recognition software. Similar sounding words can inadvertently be transcribed and may not be corrected upon review.

## 2015-11-03 NOTE — Telephone Encounter (Signed)
Gave and printed appt sched and avs for pt for Sept 2017

## 2015-11-12 ENCOUNTER — Telehealth: Payer: Self-pay

## 2015-11-12 MED ORDER — ISOSORBIDE MONONITRATE ER 30 MG PO TB24: ORAL_TABLET | ORAL | Status: DC

## 2015-11-12 MED ORDER — CITALOPRAM HYDROBROMIDE 40 MG PO TABS: 40.0000 mg | ORAL_TABLET | Freq: Every day | ORAL | Status: DC

## 2015-11-12 MED ORDER — CLONIDINE HCL 0.1 MG PO TABS: 0.1000 mg | ORAL_TABLET | Freq: Two times a day (BID) | ORAL | Status: DC

## 2015-11-12 MED ORDER — DONEPEZIL HCL 5 MG PO TABS: 5.0000 mg | ORAL_TABLET | Freq: Every day | ORAL | Status: DC

## 2015-11-12 MED ORDER — PANTOPRAZOLE SODIUM 40 MG PO TBEC: 40.0000 mg | DELAYED_RELEASE_TABLET | Freq: Every day | ORAL | Status: DC

## 2015-11-12 MED ORDER — AMLODIPINE BESYLATE 10 MG PO TABS: 10.0000 mg | ORAL_TABLET | Freq: Every day | ORAL | Status: DC

## 2015-11-12 MED ORDER — CLOPIDOGREL BISULFATE 75 MG PO TABS: 75.0000 mg | ORAL_TABLET | Freq: Every day | ORAL | Status: DC

## 2015-11-12 MED ORDER — LOVASTATIN 40 MG PO TABS: 40.0000 mg | ORAL_TABLET | Freq: Every day | ORAL | Status: DC

## 2015-11-12 NOTE — Telephone Encounter (Signed)
Medications sent to pharmacy

## 2015-11-16 ENCOUNTER — Other Ambulatory Visit: Payer: Self-pay

## 2015-11-16 MED ORDER — PANTOPRAZOLE SODIUM 40 MG PO TBEC: 40.0000 mg | DELAYED_RELEASE_TABLET | Freq: Every day | ORAL | Status: DC

## 2015-11-16 MED ORDER — CLOPIDOGREL BISULFATE 75 MG PO TABS: 75.0000 mg | ORAL_TABLET | Freq: Every day | ORAL | Status: DC

## 2015-11-16 MED ORDER — ISOSORBIDE MONONITRATE ER 30 MG PO TB24: 30.0000 mg | ORAL_TABLET | Freq: Every day | ORAL | Status: DC

## 2015-11-25 DIAGNOSIS — C61 Malignant neoplasm of prostate: Secondary | ICD-10-CM | POA: Diagnosis not present

## 2015-11-29 ENCOUNTER — Ambulatory Visit (HOSPITAL_COMMUNITY)
Admission: RE | Admit: 2015-11-29 | Discharge: 2015-11-29 | Disposition: A | Discharge status: Home / Self Care | Payer: Medicare Other | Source: Ambulatory Visit | Attending: Cardiology | Admitting: Cardiology

## 2015-11-29 DIAGNOSIS — N189 Chronic kidney disease, unspecified: Secondary | ICD-10-CM | POA: Insufficient documentation

## 2015-11-29 DIAGNOSIS — I6523 Occlusion and stenosis of bilateral carotid arteries: Secondary | ICD-10-CM | POA: Insufficient documentation

## 2015-11-29 DIAGNOSIS — E785 Hyperlipidemia, unspecified: Secondary | ICD-10-CM | POA: Diagnosis not present

## 2015-11-29 DIAGNOSIS — K219 Gastro-esophageal reflux disease without esophagitis: Secondary | ICD-10-CM | POA: Insufficient documentation

## 2015-11-29 DIAGNOSIS — I129 Hypertensive chronic kidney disease with stage 1 through stage 4 chronic kidney disease, or unspecified chronic kidney disease: Secondary | ICD-10-CM | POA: Diagnosis not present

## 2015-12-02 DIAGNOSIS — N393 Stress incontinence (female) (male): Secondary | ICD-10-CM | POA: Diagnosis not present

## 2015-12-02 DIAGNOSIS — C61 Malignant neoplasm of prostate: Secondary | ICD-10-CM | POA: Diagnosis not present

## 2015-12-02 DIAGNOSIS — Z0000 Encounter for general adult medical examination without abnormal findings: Secondary | ICD-10-CM | POA: Diagnosis not present

## 2015-12-06 ENCOUNTER — Telehealth: Payer: Self-pay

## 2015-12-06 ENCOUNTER — Telehealth: Payer: Self-pay | Admitting: Cardiovascular Disease

## 2015-12-06 NOTE — Telephone Encounter (Signed)
Spoke with patients wife and she was requesting an rx for citalopram. I made her aware that this rx would need to come from the patients pcp. She stated that she had already called Dr Judi Cong office about this and she will call our office back to let us know when he needs his cardiac meds refilled to [facility].

## 2015-12-06 NOTE — Telephone Encounter (Signed)
Victor Davis is calling because they have switched to a mail order for their prescriptions and needing new prescriptions sent to [facility] ph# [phone]. Please call   Thanks

## 2015-12-06 NOTE — Telephone Encounter (Signed)
Patient is going to mail RX  With [facility] [id number]) They need and OK from DR. John and the [employer]. Please advise.

## 2015-12-17 DIAGNOSIS — C61 Malignant neoplasm of prostate: Secondary | ICD-10-CM | POA: Diagnosis not present

## 2015-12-21 DIAGNOSIS — H3500 Unspecified background retinopathy: Secondary | ICD-10-CM | POA: Diagnosis not present

## 2015-12-21 DIAGNOSIS — H2512 Age-related nuclear cataract, left eye: Secondary | ICD-10-CM | POA: Diagnosis not present

## 2015-12-21 DIAGNOSIS — H524 Presbyopia: Secondary | ICD-10-CM | POA: Diagnosis not present

## 2016-01-18 ENCOUNTER — Ambulatory Visit (INDEPENDENT_AMBULATORY_CARE_PROVIDER_SITE_OTHER): Payer: Medicare Other | Admitting: Thoracic Surgery (Cardiothoracic Vascular Surgery)

## 2016-01-18 ENCOUNTER — Ambulatory Visit: Payer: Medicare Other | Admitting: Thoracic Surgery (Cardiothoracic Vascular Surgery)

## 2016-01-18 ENCOUNTER — Encounter: Payer: Self-pay | Admitting: Thoracic Surgery (Cardiothoracic Vascular Surgery)

## 2016-01-18 VITALS — BP 170/80 | HR 60 | Resp 20 | Ht 67.0 in | Wt 145.0 lb

## 2016-01-18 DIAGNOSIS — C3491 Malignant neoplasm of unspecified part of right bronchus or lung: Secondary | ICD-10-CM

## 2016-01-18 DIAGNOSIS — Z902 Acquired absence of lung [part of]: Secondary | ICD-10-CM

## 2016-01-18 NOTE — Progress Notes (Signed)
[address]       [place] [address]             [phone]       HPI: [name] returns for a scheduled follow-up visit.  He is a 75 year old man who had a thoracoscopic right lower lobe superior segmentectomy for a stage IA non-small cell carcinoma in August 2016. I last saw him in the office in November. He was doing well at that time.  He saw Dr. Julien Nordmann in March of this year.  He is concerned about his blood pressure being elevated. He checks it twice a day and says it is often elevated over the past few weeks. He has not had any problems with shortness of breath or wheezing. He does complain of some left-sided chest discomfort. He has a history of coronary disease. He says that this only occurs after eating and is not associated with exertion.  Past Medical History  Diagnosis Date  . BRADYCARDIA 10/24/2010  . CALF PAIN, RIGHT 10/22/2009  . 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 Dysuria 10/10/2010  . ERECTILE DYSFUNCTION 04/05/2007  . FATIGUE 10/02/2007  . GLUCOSE INTOLERANCE 07/15/2008  . HYPERLIPIDEMIA 07/30/2008  . HYPERSOMNIA 10/02/2007  . INSOMNIA-SLEEP DISORDER-UNSPEC 10/22/2009  . LOW BACK PAIN 04/05/2007  . OSTEOARTHRITIS 04/05/2007  . PEPTIC ULCER DISEASE 04/05/2007  . PERIPHERAL EDEMA 10/22/2009  . PERIPHERAL VASCULAR DISEASE 07/30/2008  . PROSTATE CANCER, HX OF 04/05/2007  . RESTLESS LEG SYNDROME 05/15/2007  . RHINITIS, ALLERGIC NOS 05/15/2007  . SHOULDER PAIN, LEFT 01/22/2008  . SINUSITIS- ACUTE-NOS 05/15/2007  . Unspecified essential hypertension 04/05/2007  . Unspecified visual loss 07/15/2008  . UTI 10/24/2010  . Impaired glucose tolerance 02/09/2011  . Coronary artery disease      95% followed by 80% mid LAD stenosis, circumflex 80% stenosis. There was a moderate size branching obtuse marginal with 90-95% stenosis in the inferior branch. The right coronary artery had proximal 30% stenosis. Posterior lateral was moderate-sized with 40% stenosis.  The EF was 65-70%. He had a DES and  x 2 to the LAD and DES x 2 to the circumflex 03/08/12.    [name] GERD (gastroesophageal reflux disease)   . Emphysema 04/01/2012    By CT chest, august 2013  . CKD (chronic kidney disease), stage II       Current Outpatient Prescriptions  Medication Sig Dispense Refill  . amLODipine (NORVASC) 10 MG tablet Take 1 tablet (10 mg total) by mouth daily. 90 tablet 3  . aspirin 81 MG EC tablet Take 81 mg by mouth daily.      . citalopram (CELEXA) 40 MG tablet Take 1 tablet (40 mg total) by mouth daily. 30 tablet 5  . cloNIDine (CATAPRES) 0.1 MG tablet Take 1 tablet (0.1 mg total) by mouth 2 (two) times daily. TAKE 1 TABLET BY MOUTH  IN THE MORNING TAKE 2 TABLETS IN THE EVENING 60 tablet 3  . clopidogrel (PLAVIX) 75 MG tablet Take 1 tablet (75 mg total) by mouth daily. 90 tablet 1  . Cyanocobalamin (VITAMIN B-12 PO) Take 1 tablet by mouth daily.    [name] donepezil (ARICEPT) 5 MG tablet Take 1 tablet (5 mg total) by mouth at bedtime. 90 tablet 3  . enzalutamide (XTANDI) 40 MG capsule Take 80 mg by mouth daily.    [name] gabapentin (NEURONTIN) 100 MG capsule Take 1 capsule (100 mg total) by mouth 2 (two) times daily. 180 capsule 1  . gabapentin (NEURONTIN) 300  MG capsule 1 by mouth at bedtime, in addition to the prior daytime dosing as well 90 capsule 1  . GuaiFENesin (MUCINEX PO) Take 1 tablet by mouth every 12 (twelve) hours as needed (cold symptoms).    . isosorbide mononitrate (IMDUR) 30 MG 24 hr tablet Take 1 tablet (30 mg total) by mouth daily. 90 tablet 1  . loratadine (CLARITIN) 10 MG tablet Take 10 mg by mouth daily.    [name] lovastatin (MEVACOR) 40 MG tablet Take 1 tablet (40 mg total) by mouth at bedtime. 30 tablet 9  . NITROSTAT 0.4 MG SL tablet PLACE 1 TABLET (0.4 MG TOTAL) UNDER THE TONGUE EVERY 5 (FIVE) MINUTES AS NEEDED FOR CHEST PAIN. 25 tablet 5  . oxyCODONE (OXY IR/ROXICODONE) 5 MG immediate release tablet Take 1-2 tablets (5-10 mg total) by mouth every 6 (six)  hours as needed for severe pain. 50 tablet 0  . pantoprazole (PROTONIX) 40 MG tablet Take 1 tablet (40 mg total) by mouth daily. 90 tablet 1  . pramipexole (MIRAPEX) 1 MG tablet Take 1 tablet (1 mg total) by mouth 3 (three) times daily. 180 tablet 1   No current facility-administered medications for this visit.    Physical Exam BP 170/80 mmHg  Pulse 60  Resp 20  Ht '5\' 7"'$  (1.702 m)  Wt 145 lb (65.772 kg)  BMI 22.71 kg/m2  SpO71 40% 75 year old man in no acute distress Alert and oriented 3 with no focal deficits No cervical or subclavicular adenopathy Cardiac regular rate and rhythm normal S1 and S2 Lungs clear with equal breath sounds bilaterally Incisions well healed  Diagnostic Tests: No new tests done this visit. I did personally review his CT from March. There was no evidence of recurrent disease.  Impression: 75 year old man who is now about 9 months out from a right lower lobe superior segmentectomy for stage IA non-small cell carcinoma. He is doing well with no evidence recurrent disease.  Tobacco abuse- he quit smoking many years ago.  Hypertension- his blood pressure was elevated with at 940 systolic today. He follows at home and says his been running at high for the last few weeks. He has an appointment with Dr. Jenny Reichmann later this week regarding that.  Chest pain- he describes this is only being associated with eating. He does not have any  Exertional component whatsoever. He does have a history of coronary disease, but this sounds more like reflux. He is followed by Dr. Murvin Natal for coronary disease.  Plan: Follow-up with Dr. Jenny Reichmann regarding blood pressure  I will see him back in September after he sees Dr. Julien Nordmann and has had a CT scan done for his one-year follow-up visit.  Melrose Nakayama, MD [facility] [phone]

## 2016-01-21 ENCOUNTER — Ambulatory Visit (INDEPENDENT_AMBULATORY_CARE_PROVIDER_SITE_OTHER): Payer: Medicare Other | Admitting: Internal Medicine

## 2016-01-21 ENCOUNTER — Encounter: Payer: Self-pay | Admitting: Internal Medicine

## 2016-01-21 VITALS — BP 110/60 | HR 76 | Temp 98.0°F | Resp 16 | Wt 148.0 lb

## 2016-01-21 DIAGNOSIS — R0683 Snoring: Secondary | ICD-10-CM | POA: Insufficient documentation

## 2016-01-21 DIAGNOSIS — I10 Essential (primary) hypertension: Secondary | ICD-10-CM

## 2016-01-21 NOTE — Patient Instructions (Addendum)
Start checking your blood pressure twice a day and keep a log.   Bring that and your blood pressure cuff in for your next visit.     Medications reviewed and updated.  No changes recommended at this time.   A referral was ordered for pulmonary for evaluation of sleep apnea which may be contributing to your fatigue.   Please followup in 2 weeks with Dr. Jenny Reichmann

## 2016-01-21 NOTE — Progress Notes (Signed)
Pre visit review using our clinic review tool, if applicable. No additional management support is needed unless otherwise documented below in the visit note. 

## 2016-01-21 NOTE — Assessment & Plan Note (Signed)
Associated with day time fatigue, easily falls asleep and morning headaches ? Sleep apnea Will refer to pulmonary

## 2016-01-21 NOTE — Progress Notes (Signed)
Subjective:    Patient ID: Victor Davis, male    DOB: [birth year], 75 y.o.   MRN: [id number]  HPI He is here for an acute visit.    Hypertension: He is taking his medication daily as prescribed - we did review how he should be taking them.   He does monitor his blood pressure at home -  It has been high at night 170/80, 205/?[name]  He takes his blood pressure once a day at night and he has not tried taking it in the morning.    He does not like clonidine - it makes him very tired.  He wakes up tired.  He does snore.  He can fall asleep at a stop light while driving.  He blames this on the clonidine.  His wife is unsure if he has any apnea.  He sometimes gets morning headaches.    Medications and allergies reviewed with patient and updated if appropriate.  Patient Active Problem List   Diagnosis Date Noted  . Mild cognitive impairment 10/08/2015  . Multiple bruises 10/08/2015  . Non-small cell carcinoma of right lung, stage 1 ([place]) 04/29/2015  . Lung nodule 03/22/2015  . Cholecystostomy drain infection ([place]) 01/25/2015  . Abdominal pain 01/18/2015  . Cholecystitis 01/18/2015  . Acute on chronic kidney failure ([place]) 01/18/2015  . Right upper quadrant pain   . Acute cholecystitis   . Renal cyst 12/31/2012  . Hearing loss 07/05/2012  . Lesion of nose 07/05/2012  . Anemia, unspecified 04/02/2012  . Pulmonary emphysema ([place]) 04/01/2012  . Hypertension 03/09/2012  . Pulmonary nodule, right 02/08/2012  . Mitral regurgitation 02/06/2012  . CAD (coronary artery disease) 01/04/2012  . Unspecified disorder of liver 12/18/2011  . Hyperthyroidism 12/18/2011  . Nonspecific abnormal findings on radiological and examination of lung field 12/18/2011  . Impaired glucose tolerance 02/09/2011  . Preventative health care 02/09/2011  . BRADYCARDIA 10/24/2010  . INSOMNIA-SLEEP DISORDER-UNSPEC 10/22/2009  . PERIPHERAL EDEMA 10/22/2009  . HLD (hyperlipidemia) 07/30/2008  . Peripheral vascular disease  ([place]) 07/30/2008  . 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 Unspecified visual loss 07/15/2008  . SHOULDER PAIN, LEFT 01/22/2008  . HYPERSOMNIA 10/02/2007  . RESTLESS LEG SYNDROME 05/15/2007  . RHINITIS, ALLERGIC NOS 05/15/2007  . ERECTILE DYSFUNCTION 04/05/2007  . PEPTIC ULCER DISEASE 04/05/2007  . Osteoarthritis 04/05/2007  . LOW BACK PAIN 04/05/2007  . PROSTATE CANCER, HX OF 04/05/2007    Current Outpatient Prescriptions on File Prior to Visit  Medication Sig Dispense Refill  . amLODipine (NORVASC) 10 MG tablet Take 1 tablet (10 mg total) by mouth daily. 90 tablet 3  . aspirin 81 MG EC tablet Take 81 mg by mouth daily.      . citalopram (CELEXA) 40 MG tablet Take 1 tablet (40 mg total) by mouth daily. 30 tablet 5  . cloNIDine (CATAPRES) 0.1 MG tablet Take 1 tablet (0.1 mg total) by mouth 2 (two) times daily. TAKE 1 TABLET BY MOUTH  IN THE MORNING TAKE 2 TABLETS IN THE EVENING 60 tablet 3  . clopidogrel (PLAVIX) 75 MG tablet Take 1 tablet (75 mg total) by mouth daily. 90 tablet 1  . Cyanocobalamin (VITAMIN B-12 PO) Take 1 tablet by mouth daily.    [name] donepezil (ARICEPT) 5 MG tablet Take 1 tablet (5 mg total) by mouth at bedtime. 90 tablet 3  . gabapentin (NEURONTIN) 100 MG capsule Take 1 capsule (100 mg total) by mouth 2 (two) times daily. 180 capsule 1  . GuaiFENesin [facility]  PO) Take 1 tablet by mouth every 12 (twelve) hours as needed (cold symptoms).    . isosorbide mononitrate (IMDUR) 30 MG 24 hr tablet Take 1 tablet (30 mg total) by mouth daily. 90 tablet 1  . loratadine (CLARITIN) 10 MG tablet Take 10 mg by mouth daily.    [name] lovastatin (MEVACOR) 40 MG tablet Take 1 tablet (40 mg total) by mouth at bedtime. 30 tablet 9  . NITROSTAT 0.4 MG SL tablet PLACE 1 TABLET (0.4 MG TOTAL) UNDER THE TONGUE EVERY 5 (FIVE) MINUTES AS NEEDED FOR CHEST PAIN. 25 tablet 5  . pantoprazole (PROTONIX) 40 MG tablet Take 1 tablet (40 mg total) by mouth daily. 90 tablet 1  . pramipexole (MIRAPEX) 1 MG  tablet Take 1 tablet (1 mg total) by mouth 3 (three) times daily. 180 tablet 1   No current facility-administered medications on file prior to visit.    Past Medical History  Diagnosis Date  . BRADYCARDIA 10/24/2010  . CALF PAIN, RIGHT 10/22/2009  . 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 Dysuria 10/10/2010  . ERECTILE DYSFUNCTION 04/05/2007  . FATIGUE 10/02/2007  . GLUCOSE INTOLERANCE 07/15/2008  . HYPERLIPIDEMIA 07/30/2008  . HYPERSOMNIA 10/02/2007  . INSOMNIA-SLEEP DISORDER-UNSPEC 10/22/2009  . LOW BACK PAIN 04/05/2007  . OSTEOARTHRITIS 04/05/2007  . PEPTIC ULCER DISEASE 04/05/2007  . PERIPHERAL EDEMA 10/22/2009  . PERIPHERAL VASCULAR DISEASE 07/30/2008  . PROSTATE CANCER, HX OF 04/05/2007  . RESTLESS LEG SYNDROME 05/15/2007  . RHINITIS, ALLERGIC NOS 05/15/2007  . SHOULDER PAIN, LEFT 01/22/2008  . SINUSITIS- ACUTE-NOS 05/15/2007  . Unspecified essential hypertension 04/05/2007  . Unspecified visual loss 07/15/2008  . UTI 10/24/2010  . Impaired glucose tolerance 02/09/2011  . Coronary artery disease      95% followed by 80% mid LAD stenosis, circumflex 80% stenosis. There was a moderate size branching obtuse marginal with 90-95% stenosis in the inferior branch. The right coronary artery had proximal 30% stenosis. Posterior lateral was moderate-sized with 40% stenosis. The EF was 65-70%. He had a DES and  x 2 to the LAD and DES x 2 to the circumflex 03/08/12.    [name] GERD (gastroesophageal reflux disease)   . Emphysema 04/01/2012    By CT chest, august 2013  . CKD (chronic kidney disease), stage II     Past Surgical History  Procedure Laterality Date  . Inguinal herniorrhapy left  2002    x 2  . Rotator cuff repair      left  . Knee surgery      left  . S/p ventral surgery  2009  . Prostatectomy  2002    radical  . Right hip replacement  09/22/09  . Left hip replacement    . Eye surgery      right  . Percutaneous coronary stent intervention (pci-s) N/A 03/08/2012    Procedure: PERCUTANEOUS  CORONARY STENT INTERVENTION (PCI-S);  Surgeon: Burnell Blanks, MD;  Location: [facility] CATH LAB;  Service: Cardiovascular;  Laterality: N/A;  . Video assisted thoracoscopy Right 03/22/2015    Procedure: RIGHT VIDEO ASSISTED THORACOSCOPY;  Surgeon: Melrose Nakayama, MD;  Location: [place];  Service: Thoracic;  Laterality: Right;  . Segmentecomy Right 03/22/2015    Procedure: RIGHT LOWER LOBE SUPERIOR SEGMENTECTOMY;  Surgeon: Melrose Nakayama, MD;  Location: [place];  Service: Thoracic;  Laterality: Right;  . Lymph node dissection Right 03/22/2015    Procedure: LYMPH NODE DISSECTION;  Surgeon: Melrose Nakayama, MD;  Location: [facility];  Service: Thoracic;  Laterality:  Right;    Social History   Social History  . Marital Status: Married    Spouse Name: N/A  . Number of Children: 2  . Years of Education: N/A   Occupational History  . truck driver-retired    Social History Main Topics  . Smoking status: Former Smoker -- 1.00 packs/day for 15 years    Types: Cigarettes    Quit date: 02/05/1977  . Smokeless tobacco: Never Used  . Alcohol Use: 0.5 oz/week    1 Standard drinks or equivalent per week     Comment: RARE  . Drug Use: No  . Sexual Activity: Not Currently   Other Topics Concern  . Not on file   Social History Narrative    Family History  Problem Relation Age of Onset  . Heart attack Father 86    Smoker  . Heart attack Brother 62  . Colon cancer Neg Hx   . Lung cancer Sister     Review of Systems  Constitutional: Negative for fever.  Respiratory: Positive for shortness of breath (no change). Negative for cough and wheezing.   Cardiovascular: Positive for chest pain (occ left sided chest pain, new - occurs at rest) and leg swelling. Negative for palpitations.  Neurological: Positive for dizziness (occasional) and headaches (morning).       Objective:   Filed Vitals:   01/21/16 1418  BP: 110/60  Pulse: 76  Temp: 98 F (36.7 C)  Resp: 16   Filed Weights     01/21/16 1418  Weight: 148 lb (67.132 kg)   Body mass index is 23.17 kg/(m^2).   Physical Exam Constitutional: Appears well-developed and well-nourished. No distress.  Neck: Neck supple. No tracheal deviation present. No thyromegaly present.  No carotid bruit. No cervical adenopathy.   Cardiovascular: Normal rate, regular rhythm and normal heart sounds.   No murmur heard.  mild edema Pulmonary/Chest: Effort normal and breath sounds normal. No respiratory distress. No wheezes.         Assessment & Plan:   See Problem List for Assessment and Plan of chronic medical problems.   F/u 2 weeks with Dr Jenny Reichmann

## 2016-01-21 NOTE — Assessment & Plan Note (Signed)
Well controlled here -- elevated at home He takes his BP at night and we are unsure how reliable his cuff his He will start taking his BP twice daily and keep a log Bring cuff to next appointment No medication changes today Follow up with Dr. Jenny Reichmann in about 2 weeks

## 2016-02-04 ENCOUNTER — Encounter: Payer: Self-pay | Admitting: Internal Medicine

## 2016-02-04 ENCOUNTER — Ambulatory Visit (INDEPENDENT_AMBULATORY_CARE_PROVIDER_SITE_OTHER): Payer: Medicare Other | Admitting: Internal Medicine

## 2016-02-04 ENCOUNTER — Other Ambulatory Visit (INDEPENDENT_AMBULATORY_CARE_PROVIDER_SITE_OTHER): Payer: Medicare Other

## 2016-02-04 ENCOUNTER — Telehealth: Payer: Self-pay

## 2016-02-04 VITALS — BP 136/74 | HR 70 | Temp 98.0°F | Resp 20 | Wt 146.0 lb

## 2016-02-04 DIAGNOSIS — D649 Anemia, unspecified: Secondary | ICD-10-CM

## 2016-02-04 DIAGNOSIS — N183 Chronic kidney disease, stage 3 unspecified: Secondary | ICD-10-CM

## 2016-02-04 DIAGNOSIS — R7302 Impaired glucose tolerance (oral): Secondary | ICD-10-CM

## 2016-02-04 DIAGNOSIS — I10 Essential (primary) hypertension: Secondary | ICD-10-CM

## 2016-02-04 DIAGNOSIS — E785 Hyperlipidemia, unspecified: Secondary | ICD-10-CM

## 2016-02-04 LAB — CBC WITH DIFFERENTIAL/PLATELET
Basophils Absolute: 0 10*3/uL (ref 0.0–0.1)
Basophils Relative: 0.4 % (ref 0.0–3.0)
EOS PCT: 3.4 % (ref 0.0–5.0)
Eosinophils Absolute: 0.3 10*3/uL (ref 0.0–0.7)
HCT: 33.9 % — ABNORMAL LOW (ref 39.0–52.0)
HEMOGLOBIN: 11.1 g/dL — AB (ref 13.0–17.0)
LYMPHS PCT: 26.2 % (ref 12.0–46.0)
Lymphs Abs: 2.1 10*3/uL (ref 0.7–4.0)
MCHC: 32.7 g/dL (ref 30.0–36.0)
MCV: 85.9 fl (ref 78.0–100.0)
MONO ABS: 0.7 10*3/uL (ref 0.1–1.0)
MONOS PCT: 9 % (ref 3.0–12.0)
Neutro Abs: 4.8 10*3/uL (ref 1.4–7.7)
Neutrophils Relative %: 61 % (ref 43.0–77.0)
Platelets: 175 10*3/uL (ref 150.0–400.0)
RBC: 3.95 Mil/uL — AB (ref 4.22–5.81)
RDW: 13.8 % (ref 11.5–15.5)
WBC: 7.9 10*3/uL (ref 4.0–10.5)

## 2016-02-04 LAB — LIPID PANEL
Cholesterol: 145 mg/dL (ref 0–200)
HDL: 33 mg/dL — ABNORMAL LOW (ref >39.00)
LDL Cholesterol: 88 mg/dL (ref 0–99)
NonHDL: 112.05
Total CHOL/HDL Ratio: 4
Triglycerides: 118 mg/dL (ref 0.0–149.0)
VLDL: 23.6 mg/dL (ref 0.0–40.0)

## 2016-02-04 LAB — BASIC METABOLIC PANEL
BUN: 28 mg/dL — AB (ref 6–23)
CO2: 29 mEq/L (ref 19–32)
CREATININE: 1.83 mg/dL — AB (ref 0.40–1.50)
Calcium: 9.1 mg/dL (ref 8.4–10.5)
Chloride: 104 mEq/L (ref 96–112)
GFR: 38.4 mL/min — AB (ref >60.00)
GLUCOSE: 94 mg/dL (ref 70–99)
Potassium: 4.6 mEq/L (ref 3.5–5.1)
SODIUM: 137 meq/L (ref 135–145)

## 2016-02-04 MED ORDER — PANTOPRAZOLE SODIUM 40 MG PO TBEC: 40.0000 mg | DELAYED_RELEASE_TABLET | Freq: Every day | ORAL | Status: DC

## 2016-02-04 MED ORDER — LOVASTATIN 40 MG PO TABS: 40.0000 mg | ORAL_TABLET | Freq: Every day | ORAL | Status: DC

## 2016-02-04 MED ORDER — ISOSORBIDE MONONITRATE ER 30 MG PO TB24: 30.0000 mg | ORAL_TABLET | Freq: Every day | ORAL | Status: DC

## 2016-02-04 MED ORDER — CLONIDINE HCL 0.1 MG PO TABS: 0.1000 mg | ORAL_TABLET | Freq: Two times a day (BID) | ORAL | Status: DC

## 2016-02-04 MED ORDER — PRAMIPEXOLE DIHYDROCHLORIDE 1 MG PO TABS: 1.0000 mg | ORAL_TABLET | Freq: Three times a day (TID) | ORAL | Status: DC

## 2016-02-04 MED ORDER — AMLODIPINE BESYLATE 10 MG PO TABS: 10.0000 mg | ORAL_TABLET | Freq: Every day | ORAL | Status: DC

## 2016-02-04 MED ORDER — CITALOPRAM HYDROBROMIDE 40 MG PO TABS: 40.0000 mg | ORAL_TABLET | Freq: Every day | ORAL | Status: DC

## 2016-02-04 MED ORDER — GABAPENTIN 100 MG PO CAPS: 100.0000 mg | ORAL_CAPSULE | Freq: Two times a day (BID) | ORAL | Status: DC

## 2016-02-04 MED ORDER — CLOPIDOGREL BISULFATE 75 MG PO TABS: 75.0000 mg | ORAL_TABLET | Freq: Every day | ORAL | Status: DC

## 2016-02-04 NOTE — Assessment & Plan Note (Signed)
stable overall by history and exam, recent data reviewed with pt, and pt to continue medical treatment as before,  to f/u any worsening symptoms or concerns Lab Results  Component Value Date   WBC 7.9 02/04/2016   HGB 11.1* 02/04/2016   HCT 33.9* 02/04/2016   MCV 85.9 02/04/2016   PLT 175.0 02/04/2016

## 2016-02-04 NOTE — Assessment & Plan Note (Signed)
stable overall by history and exam, recent data reviewed with pt, and pt to continue medical treatment as before,  to f/u any worsening symptoms or concerns Lab Results  Component Value Date   LDLCALC 88 02/04/2016

## 2016-02-04 NOTE — Telephone Encounter (Signed)
Medication refills sent

## 2016-02-04 NOTE — Progress Notes (Signed)
Subjective:    Patient ID: Victor Davis, male    DOB: 03-Aug-1941, 75 y.o.   MRN: [id number]  HPI  Here to f/u; overall doing ok,  Pt denies chest pain, increasing sob or doe, wheezing, orthopnea, PND, increased LE swelling, palpitations, dizziness or syncope.  Pt denies new neurological symptoms such as new headache, or facial or extremity weakness or numbness.  Pt denies polydipsia, polyuria, or low sugar episode.   Pt denies new neurological symptoms such as new headache, or facial or extremity weakness or numbness.   Pt states overall good compliance with meds, mostly trying to follow appropriate diet, with wt overall stable,  but little exercise however. Now taking all meds, was not taking one BP med before. BP Readings from Last 3 Encounters:  02/04/16 136/74  01/21/16 110/60  01/18/16 170/80   Past Medical History  Diagnosis Date  . BRADYCARDIA 10/24/2010  . CALF PAIN, RIGHT 10/22/2009  . 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 Dysuria 10/10/2010  . ERECTILE DYSFUNCTION 04/05/2007  . FATIGUE 10/02/2007  . GLUCOSE INTOLERANCE 07/15/2008  . HYPERLIPIDEMIA 07/30/2008  . HYPERSOMNIA 10/02/2007  . INSOMNIA-SLEEP DISORDER-UNSPEC 10/22/2009  . LOW BACK PAIN 04/05/2007  . OSTEOARTHRITIS 04/05/2007  . PEPTIC ULCER DISEASE 04/05/2007  . PERIPHERAL EDEMA 10/22/2009  . PERIPHERAL VASCULAR DISEASE 07/30/2008  . PROSTATE CANCER, HX OF 04/05/2007  . RESTLESS LEG SYNDROME 05/15/2007  . RHINITIS, ALLERGIC NOS 05/15/2007  . SHOULDER PAIN, LEFT 01/22/2008  . SINUSITIS- ACUTE-NOS 05/15/2007  . Unspecified essential hypertension 04/05/2007  . Unspecified visual loss 07/15/2008  . UTI 10/24/2010  . Impaired glucose tolerance 02/09/2011  . Coronary artery disease      95% followed by 80% mid LAD stenosis, circumflex 80% stenosis. There was a moderate size branching obtuse marginal with 90-95% stenosis in the inferior branch. The right coronary artery had proximal 30% stenosis. Posterior lateral was moderate-sized  with 40% stenosis. The EF was 65-70%. He had a DES and  x 2 to the LAD and DES x 2 to the circumflex 03/08/12.    [name] GERD (gastroesophageal reflux disease)   . Emphysema 04/01/2012    By CT chest, august 2013  . CKD (chronic kidney disease), stage II   . CKD (chronic kidney disease), stage III 02/04/2016   Past Surgical History  Procedure Laterality Date  . Inguinal herniorrhapy left  2002    x 2  . Rotator cuff repair      left  . Knee surgery      left  . S/p ventral surgery  2009  . Prostatectomy  2002    radical  . Right hip replacement  09/22/09  . Left hip replacement    . Eye surgery      right  . Percutaneous coronary stent intervention (pci-s) N/A 03/08/2012    Procedure: PERCUTANEOUS CORONARY STENT INTERVENTION (PCI-S);  Surgeon: Burnell Blanks, MD;  Location: [facility] CATH LAB;  Service: Cardiovascular;  Laterality: N/A;  . Video assisted thoracoscopy Right 03/22/2015    Procedure: RIGHT VIDEO ASSISTED THORACOSCOPY;  Surgeon: Melrose Nakayama, MD;  Location: [place];  Service: Thoracic;  Laterality: Right;  . Segmentecomy Right 03/22/2015    Procedure: RIGHT LOWER LOBE SUPERIOR SEGMENTECTOMY;  Surgeon: Melrose Nakayama, MD;  Location: [facility];  Service: Thoracic;  Laterality: Right;  . Lymph node dissection Right 03/22/2015    Procedure: LYMPH NODE DISSECTION;  Surgeon: Melrose Nakayama, MD;  Location: [place];  Service: Thoracic;  Laterality: Right;  reports that he quit smoking about 39 years ago. His smoking use included Cigarettes. He has a 15 pack-year smoking history. He has never used smokeless tobacco. He reports that he drinks about 0.5 oz of alcohol per week. He reports that he does not use illicit drugs. family history includes Heart attack (age of onset: 12) in his father; Heart attack (age of onset: 64) in his brother; Lung cancer in his sister. There is no history of Colon cancer. Allergies  Allergen Reactions  . Amitriptyline Hcl Other (See Comments)     REACTION: confusion  . Diphenhydramine Hcl Other (See Comments)    Keeps him awake  . Simvastatin Other (See Comments)    REACTION: leg pain  . Tylenol [Acetaminophen] Other (See Comments)    Dr advised pt not to take due to finding a spot on pt's kidney   Current Outpatient Prescriptions on File Prior to Visit  Medication Sig Dispense Refill  . aspirin 81 MG EC tablet Take 81 mg by mouth daily.      . Cyanocobalamin (VITAMIN B-12 PO) Take 1 tablet by mouth daily.    [name] donepezil (ARICEPT) 5 MG tablet Take 1 tablet (5 mg total) by mouth at bedtime. 90 tablet 3  . GuaiFENesin (MUCINEX PO) Take 1 tablet by mouth every 12 (twelve) hours as needed (cold symptoms).    . loratadine (CLARITIN) 10 MG tablet Take 10 mg by mouth daily.    [name] NITROSTAT 0.4 MG SL tablet PLACE 1 TABLET (0.4 MG TOTAL) UNDER THE TONGUE EVERY 5 (FIVE) MINUTES AS NEEDED FOR CHEST PAIN. 25 tablet 5   No current facility-administered medications on file prior to visit.   Review of Systems  Constitutional: Negative for unusual diaphoresis or night sweats HENT: Negative for ear swelling or discharge Eyes: Negative for worsening visual haziness  Respiratory: Negative for choking and stridor.   Gastrointestinal: Negative for distension or worsening eructation Genitourinary: Negative for retention or change in urine volume.  Musculoskeletal: Negative for other MSK pain or swelling Skin: Negative for color change and worsening wound Neurological: Negative for tremors and numbness other than noted  Psychiatric/Behavioral: Negative for decreased concentration or agitation other than above       Objective:   Physical Exam BP 136/74 mmHg  Pulse 70  Temp(Src) 98 F (36.7 C) (Oral)  Resp 20  Wt 146 lb (66.225 kg)  SpO2 96% VS noted,  Constitutional: Pt appears in no apparent distress HENT: Head: NCAT.  Right Ear: External ear normal.  Left Ear: External ear normal.  Eyes: . Pupils are equal, round, and reactive to  light. Conjunctivae and EOM are normal Neck: Normal range of motion. Neck supple.  Cardiovascular: Normal rate and regular rhythm.   Pulmonary/Chest: Effort normal and breath sounds without rales or wheezing.  Abd:  Soft, NT, ND, + BS Neurological: Pt is alert. Not confused , motor grossly intact Skin: Skin is warm. No rash, no LE edema Psychiatric: Pt behavior is normal. No agitation.     Assessment & Plan:

## 2016-02-04 NOTE — Assessment & Plan Note (Signed)
stable overall by history and exam, recent data reviewed with pt, and pt to continue medical treatment as before,  to f/u any worsening symptoms or concerns BP Readings from Last 3 Encounters:  02/04/16 136/74  01/21/16 110/60  01/18/16 170/80

## 2016-02-04 NOTE — Patient Instructions (Signed)
Please continue all other medications as before, and refills have been done if requested.  Please have the pharmacy call with any other refills you may need.  Please continue your efforts at being more active, low cholesterol diet, and weight control.  Please keep your appointments with your specialists as you may have planned  Please go to the LAB in the Basement (turn left off the elevator) for the tests to be done today  You will be contacted by phone if any changes need to be made immediately.  Otherwise, you will receive a letter about your results with an explanation, but please check with MyChart first.  Please remember to sign up for MyChart if you have not done so, as this will be important to you in the future with finding out test results, communicating by private email, and scheduling acute appointments online when needed.

## 2016-02-04 NOTE — Telephone Encounter (Signed)
-----   Message from Biagio Borg, MD sent at 02/04/2016  3:47 PM EDT ----- Regarding: med refills Pt reqeusts all generic med routine refills to [facility], thanks

## 2016-02-04 NOTE — Assessment & Plan Note (Signed)
stable overall by history and exam, recent data reviewed with pt, and pt to continue medical treatment as before,  to f/u any worsening symptoms or concerns Lab Results  Component Value Date   HGBA1C 5.8 04/21/2014

## 2016-02-04 NOTE — Assessment & Plan Note (Signed)
.  stable overall by history and exam, recent data reviewed with pt, and pt to continue medical treatment as before,  to f/u any worsening symptoms or concerns Lab Results  Component Value Date   CREATININE 1.83* 02/04/2016

## 2016-02-04 NOTE — Progress Notes (Signed)
Pre visit review using our clinic review tool, if applicable. No additional management support is needed unless otherwise documented below in the visit note. 

## 2016-03-09 ENCOUNTER — Other Ambulatory Visit: Payer: Self-pay | Admitting: Internal Medicine

## 2016-04-06 ENCOUNTER — Encounter: Payer: Self-pay | Admitting: Internal Medicine

## 2016-04-06 ENCOUNTER — Other Ambulatory Visit (INDEPENDENT_AMBULATORY_CARE_PROVIDER_SITE_OTHER): Payer: Medicare Other

## 2016-04-06 ENCOUNTER — Ambulatory Visit (INDEPENDENT_AMBULATORY_CARE_PROVIDER_SITE_OTHER): Payer: Medicare Other | Admitting: Internal Medicine

## 2016-04-06 ENCOUNTER — Other Ambulatory Visit: Payer: Self-pay | Admitting: Internal Medicine

## 2016-04-06 VITALS — BP 140/76 | HR 77 | Temp 97.9°F | Resp 20 | Wt 150.0 lb

## 2016-04-06 DIAGNOSIS — I10 Essential (primary) hypertension: Secondary | ICD-10-CM | POA: Diagnosis not present

## 2016-04-06 DIAGNOSIS — N183 Chronic kidney disease, stage 3 unspecified: Secondary | ICD-10-CM

## 2016-04-06 DIAGNOSIS — G8929 Other chronic pain: Secondary | ICD-10-CM

## 2016-04-06 DIAGNOSIS — M545 Low back pain, unspecified: Secondary | ICD-10-CM

## 2016-04-06 DIAGNOSIS — R7302 Impaired glucose tolerance (oral): Secondary | ICD-10-CM

## 2016-04-06 DIAGNOSIS — E785 Hyperlipidemia, unspecified: Secondary | ICD-10-CM | POA: Diagnosis not present

## 2016-04-06 LAB — LIPID PANEL
CHOLESTEROL: 103 mg/dL (ref 0–200)
HDL: 36.5 mg/dL — AB (ref >39.00)
LDL Cholesterol: 53 mg/dL (ref 0–99)
NonHDL: 66.02
TRIGLYCERIDES: 66 mg/dL (ref 0.0–149.0)
Total CHOL/HDL Ratio: 3
VLDL: 13.2 mg/dL (ref 0.0–40.0)

## 2016-04-06 LAB — HEPATIC FUNCTION PANEL
ALBUMIN: 3.7 g/dL (ref 3.5–5.2)
ALT: 7 U/L (ref 0–53)
AST: 12 U/L (ref 0–37)
Alkaline Phosphatase: 76 U/L (ref 39–117)
Bilirubin, Direct: 0.2 mg/dL (ref 0.0–0.3)
TOTAL PROTEIN: 6.3 g/dL (ref 6.0–8.3)
Total Bilirubin: 0.9 mg/dL (ref 0.2–1.2)

## 2016-04-06 LAB — BASIC METABOLIC PANEL
BUN: 33 mg/dL — AB (ref 6–23)
CHLORIDE: 107 meq/L (ref 96–112)
CO2: 28 mEq/L (ref 19–32)
CREATININE: 1.88 mg/dL — AB (ref 0.40–1.50)
Calcium: 8.8 mg/dL (ref 8.4–10.5)
GFR: 37.21 mL/min — ABNORMAL LOW (ref >60.00)
Glucose, Bld: 91 mg/dL (ref 70–99)
POTASSIUM: 4.3 meq/L (ref 3.5–5.1)
Sodium: 140 mEq/L (ref 135–145)

## 2016-04-06 LAB — HEMOGLOBIN A1C: Hgb A1c MFr Bld: 5.5 % (ref 4.6–6.5)

## 2016-04-06 MED ORDER — TRAMADOL HCL 50 MG PO TABS: 50.0000 mg | ORAL_TABLET | Freq: Three times a day (TID) | ORAL | 1 refills | Status: DC | PRN

## 2016-04-06 NOTE — Progress Notes (Signed)
Pre visit review using our clinic review tool, if applicable. No additional management support is needed unless otherwise documented below in the visit note. 

## 2016-04-06 NOTE — Patient Instructions (Addendum)
Please take all new medication as prescribed - the tramadol for pain as needed  Please continue all other medications as before, and refills have been done if requested.  Please have the pharmacy call with any other refills you may need.  Please continue your efforts at being more active, low cholesterol diet, and weight control.  You are otherwise up to date with prevention measures today.  Please keep your appointments with your specialists as you may have planned  Please go to the LAB in the Basement (turn left off the elevator) for the tests to be done today  You will be contacted by phone if any changes need to be made immediately.  Otherwise, you will receive a letter about your results with an explanation, but please check with MyChart first.  Please remember to sign up for MyChart if you have not done so, as this will be important to you in the future with finding out test results, communicating by private email, and scheduling acute appointments online when needed.  Please return in 6 months, or sooner if needed

## 2016-04-06 NOTE — Progress Notes (Signed)
Subjective:    Patient ID: Victor Davis, male    DOB: [birth year], 75 y.o.   MRN: [id number]  HPI  Here to f/u; overall doing ok,  Pt denies chest pain, increasing sob or doe, wheezing, orthopnea, PND, increased LE swelling, palpitations, dizziness or syncope.  Pt denies new neurological symptoms such as new headache, or facial or extremity weakness or numbness.  Pt denies polydipsia, polyuria, or low sugar episode.   Pt denies new neurological symptoms such as new headache, or facial or extremity weakness or numbness.   Pt states overall good compliance with meds, mostly trying to follow appropriate diet, with wt overall stable,  but little exercise however.  Does have vague bilat leg pain, mild, intermittent, sharp and dull, hard to localize such as knees or hips, worse to walk, better to sit.   Past Medical History:  Diagnosis Date  . BRADYCARDIA 10/24/2010  . CALF PAIN, RIGHT 10/22/2009  . 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 CKD (chronic kidney disease), stage II   . CKD (chronic kidney disease), stage III 02/04/2016  . Coronary artery disease     95% followed by 80% mid LAD stenosis, circumflex 80% stenosis. There was a moderate size branching obtuse marginal with 90-95% stenosis in the inferior branch. The right coronary artery had proximal 30% stenosis. Posterior lateral was moderate-sized with 40% stenosis. The EF was 65-70%. He had a DES and  x 2 to the LAD and DES x 2 to the circumflex 03/08/12.    [name] Dysuria 10/10/2010  . Emphysema 04/01/2012   By CT chest, august 2013  . ERECTILE DYSFUNCTION 04/05/2007  . FATIGUE 10/02/2007  . GERD (gastroesophageal reflux disease)   . GLUCOSE INTOLERANCE 07/15/2008  . HYPERLIPIDEMIA 07/30/2008  . HYPERSOMNIA 10/02/2007  . Impaired glucose tolerance 02/09/2011  . INSOMNIA-SLEEP DISORDER-UNSPEC 10/22/2009  . LOW BACK PAIN 04/05/2007  . OSTEOARTHRITIS 04/05/2007  . PEPTIC ULCER DISEASE 04/05/2007  . PERIPHERAL EDEMA 10/22/2009  . PERIPHERAL VASCULAR  DISEASE 07/30/2008  . PROSTATE CANCER, HX OF 04/05/2007  . RESTLESS LEG SYNDROME 05/15/2007  . RHINITIS, ALLERGIC NOS 05/15/2007  . SHOULDER PAIN, LEFT 01/22/2008  . SINUSITIS- ACUTE-NOS 05/15/2007  . Unspecified essential hypertension 04/05/2007  . Unspecified visual loss 07/15/2008  . UTI 10/24/2010   Past Surgical History:  Procedure Laterality Date  . EYE SURGERY     right  . Inguinal herniorrhapy left  2002   x 2  . KNEE SURGERY     left  . Left hip replacement    . LYMPH NODE DISSECTION Right 03/22/2015   Procedure: LYMPH NODE DISSECTION;  Surgeon: Melrose Nakayama, MD;  Location: [facility];  Service: Thoracic;  Laterality: Right;  . PERCUTANEOUS CORONARY STENT INTERVENTION (PCI-S) N/A 03/08/2012   Procedure: PERCUTANEOUS CORONARY STENT INTERVENTION (PCI-S);  Surgeon: Burnell Blanks, MD;  Location: [facility] CATH LAB;  Service: Cardiovascular;  Laterality: N/A;  . PROSTATECTOMY  2002   radical  . Right hip replacement  09/22/09  . ROTATOR CUFF REPAIR     left  . s/p ventral surgery  2009  . SEGMENTECOMY Right 03/22/2015   Procedure: RIGHT LOWER LOBE SUPERIOR SEGMENTECTOMY;  Surgeon: Melrose Nakayama, MD;  Location: [facility];  Service: Thoracic;  Laterality: Right;  [name] VIDEO ASSISTED THORACOSCOPY Right 03/22/2015   Procedure: RIGHT VIDEO ASSISTED THORACOSCOPY;  Surgeon: Melrose Nakayama, MD;  Location: [facility];  Service: Thoracic;  Laterality: Right;    reports that he quit smoking about 39 years ago. His  smoking use included Cigarettes. He has a 15.00 pack-year smoking history. He has never used smokeless tobacco. He reports that he drinks about 0.5 oz of alcohol per week . He reports that he does not use drugs. family history includes Heart attack (age of onset: 74) in his father; Heart attack (age of onset: 58) in his brother; Lung cancer in his sister. Allergies  Allergen Reactions  . Amitriptyline Hcl Other (See Comments)    REACTION: confusion  . Diphenhydramine Hcl Other (See  Comments)    Keeps him awake  . Simvastatin Other (See Comments)    REACTION: leg pain  . Tylenol [Acetaminophen] Other (See Comments)    Dr advised pt not to take due to finding a spot on pt's kidney   Current Outpatient Prescriptions on File Prior to Visit  Medication Sig Dispense Refill  . amLODipine (NORVASC) 10 MG tablet Take 1 tablet (10 mg total) by mouth daily. 90 tablet 3  . aspirin 81 MG EC tablet Take 81 mg by mouth daily.      . citalopram (CELEXA) 40 MG tablet Take 1 tablet (40 mg total) by mouth daily. 30 tablet 5  . cloNIDine (CATAPRES) 0.1 MG tablet TAKE 1 TABLET BY MOUTH IN  THE MORNING THEN TAKE 2  TABLETS IN THE EVENING 240 tablet 0  . clopidogrel (PLAVIX) 75 MG tablet Take 1 tablet (75 mg total) by mouth daily. 90 tablet 1  . Cyanocobalamin (VITAMIN B-12 PO) Take 1 tablet by mouth daily.    [name] donepezil (ARICEPT) 5 MG tablet Take 1 tablet (5 mg total) by mouth at bedtime. 90 tablet 3  . gabapentin (NEURONTIN) 100 MG capsule Take 1 capsule (100 mg total) by mouth 2 (two) times daily. 180 capsule 1  . GuaiFENesin (MUCINEX PO) Take 1 tablet by mouth every 12 (twelve) hours as needed (cold symptoms).    . isosorbide mononitrate (IMDUR) 30 MG 24 hr tablet Take 1 tablet (30 mg total) by mouth daily. 90 tablet 1  . loratadine (CLARITIN) 10 MG tablet Take 10 mg by mouth daily.    [name] lovastatin (MEVACOR) 40 MG tablet Take 1 tablet (40 mg total) by mouth at bedtime. 30 tablet 9  . NITROSTAT 0.4 MG SL tablet PLACE 1 TABLET (0.4 MG TOTAL) UNDER THE TONGUE EVERY 5 (FIVE) MINUTES AS NEEDED FOR CHEST PAIN. 25 tablet 5  . pantoprazole (PROTONIX) 40 MG tablet Take 1 tablet (40 mg total) by mouth daily. 90 tablet 1  . pramipexole (MIRAPEX) 1 MG tablet Take 1 tablet (1 mg total) by mouth 3 (three) times daily. 180 tablet 1   No current facility-administered medications on file prior to visit.    Review of Systems  Constitutional: Negative for unusual diaphoresis or night sweats HENT:  Negative for ear swelling or discharge Eyes: Negative for worsening visual haziness  Respiratory: Negative for choking and stridor.   Gastrointestinal: Negative for distension or worsening eructation Genitourinary: Negative for retention or change in urine volume.  Musculoskeletal: Negative for other MSK pain or swelling Skin: Negative for color change and worsening wound Neurological: Negative for tremors and numbness other than noted  Psychiatric/Behavioral: Negative for decreased concentration or agitation other than above       Objective:   Physical Exam BP 140/76   Pulse 77   Temp 97.9 F (36.6 C) (Oral)   Resp 20   Wt 150 lb (68 kg)   SpO2 96%   BMI 23.49 kg/m  VS noted,  Constitutional:  Pt appears in no apparent distress HENT: Head: NCAT.  Right Ear: External ear normal.  Left Ear: External ear normal.  Eyes: . Pupils are equal, round, and reactive to light. Conjunctivae and EOM are normal Neck: Normal range of motion. Neck supple.  Cardiovascular: Normal rate and regular rhythm.   Pulmonary/Chest: Effort normal and breath sounds without rales or wheezing.  Abd:  Soft, NT, ND, + BS Neurological: Pt is alert. Not confused , motor grossly intact Skin: Skin is warm. No rash, no LE edema Psychiatric: Pt behavior is normal. No agitation.     Assessment & Plan:

## 2016-04-07 NOTE — Assessment & Plan Note (Signed)
stable overall by history and exam, recent data reviewed with pt, and pt to continue medical treatment as before,  to f/u any worsening symptoms or concerns Lab Results  Component Value Date   HGBA1C 5.5 04/06/2016

## 2016-04-07 NOTE — Assessment & Plan Note (Signed)
Lab Results  Component Value Date   CREATININE 1.88 (H) 04/06/2016   For d/c ibuprofen, f/u labs next vist, declines renal referral for now

## 2016-04-07 NOTE — Assessment & Plan Note (Signed)
stable overall by history and exam, recent data reviewed with pt, and pt to continue medical treatment as before,  to f/u any worsening symptoms or concerns BP Readings from Last 3 Encounters:  04/06/16 140/76  02/04/16 136/74  01/21/16 110/60

## 2016-04-07 NOTE — Assessment & Plan Note (Signed)
Pt denies current back pain, having mild to mod intermittent pain, poor historian, for tramadol prn, consider f/u with Dr Smith/sport med but declines for now

## 2016-04-07 NOTE — Assessment & Plan Note (Signed)
stable overall by history and exam, recent data reviewed with pt, and pt to continue medical treatment as before,  to f/u any worsening symptoms or concerns Lab Results  Component Value Date   LDLCALC 53 04/06/2016

## 2016-04-12 ENCOUNTER — Institutional Professional Consult (permissible substitution): Payer: Medicare Other | Admitting: Pulmonary Disease

## 2016-04-13 DIAGNOSIS — C61 Malignant neoplasm of prostate: Secondary | ICD-10-CM | POA: Diagnosis not present

## 2016-04-18 DIAGNOSIS — N183 Chronic kidney disease, stage 3 (moderate): Secondary | ICD-10-CM | POA: Diagnosis not present

## 2016-04-18 DIAGNOSIS — N281 Cyst of kidney, acquired: Secondary | ICD-10-CM | POA: Diagnosis not present

## 2016-04-18 DIAGNOSIS — C61 Malignant neoplasm of prostate: Secondary | ICD-10-CM | POA: Diagnosis not present

## 2016-04-25 ENCOUNTER — Other Ambulatory Visit: Payer: Self-pay | Admitting: Urology

## 2016-04-25 DIAGNOSIS — C61 Malignant neoplasm of prostate: Secondary | ICD-10-CM

## 2016-04-26 ENCOUNTER — Telehealth: Payer: Self-pay | Admitting: Internal Medicine

## 2016-04-26 NOTE — Telephone Encounter (Signed)
Contacted referring provider and informed of appt date/time

## 2016-05-03 ENCOUNTER — Other Ambulatory Visit (HOSPITAL_BASED_OUTPATIENT_CLINIC_OR_DEPARTMENT_OTHER): Payer: Medicare Other

## 2016-05-03 DIAGNOSIS — D649 Anemia, unspecified: Secondary | ICD-10-CM

## 2016-05-03 DIAGNOSIS — C3431 Malignant neoplasm of lower lobe, right bronchus or lung: Secondary | ICD-10-CM

## 2016-05-03 DIAGNOSIS — C3491 Malignant neoplasm of unspecified part of right bronchus or lung: Secondary | ICD-10-CM

## 2016-05-03 LAB — CBC WITH DIFFERENTIAL/PLATELET
BASO%: 0.2 % (ref 0.0–2.0)
Basophils Absolute: 0 10*3/uL (ref 0.0–0.1)
EOS ABS: 0.3 10*3/uL (ref 0.0–0.5)
EOS%: 4.4 % (ref 0.0–7.0)
HCT: 30.5 % — ABNORMAL LOW (ref 38.4–49.9)
HEMOGLOBIN: 10.1 g/dL — AB (ref 13.0–17.1)
LYMPH%: 24.6 % (ref 14.0–49.0)
MCH: 29 pg (ref 27.2–33.4)
MCHC: 33.1 g/dL (ref 32.0–36.0)
MCV: 87.6 fL (ref 79.3–98.0)
MONO#: 0.7 10*3/uL (ref 0.1–0.9)
MONO%: 11.2 % (ref 0.0–14.0)
NEUT%: 59.6 % (ref 39.0–75.0)
NEUTROS ABS: 3.6 10*3/uL (ref 1.5–6.5)
PLATELETS: 126 10*3/uL — AB (ref 140–400)
RBC: 3.48 10*6/uL — ABNORMAL LOW (ref 4.20–5.82)
RDW: 13.2 % (ref 11.0–14.6)
WBC: 6 10*3/uL (ref 4.0–10.3)
lymph#: 1.5 10*3/uL (ref 0.9–3.3)

## 2016-05-03 LAB — COMPREHENSIVE METABOLIC PANEL
ALBUMIN: 3.3 g/dL — AB (ref 3.5–5.0)
ALK PHOS: 85 U/L (ref 40–150)
ALT: 10 U/L (ref 0–55)
AST: 14 U/L (ref 5–34)
Anion Gap: 8 mEq/L (ref 3–11)
BILIRUBIN TOTAL: 0.75 mg/dL (ref 0.20–1.20)
BUN: 30.4 mg/dL — AB (ref 7.0–26.0)
CO2: 25 mEq/L (ref 22–29)
Calcium: 8.8 mg/dL (ref 8.4–10.4)
Chloride: 107 mEq/L (ref 98–109)
Creatinine: 2.3 mg/dL — ABNORMAL HIGH (ref 0.7–1.3)
EGFR: 26 mL/min/{1.73_m2} — AB (ref >90)
GLUCOSE: 107 mg/dL (ref 70–140)
Potassium: 4.1 mEq/L (ref 3.5–5.1)
SODIUM: 140 meq/L (ref 136–145)
TOTAL PROTEIN: 6.1 g/dL — AB (ref 6.4–8.3)

## 2016-05-10 ENCOUNTER — Ambulatory Visit (HOSPITAL_BASED_OUTPATIENT_CLINIC_OR_DEPARTMENT_OTHER): Payer: Medicare Other | Admitting: Internal Medicine

## 2016-05-10 ENCOUNTER — Telehealth: Payer: Self-pay | Admitting: Internal Medicine

## 2016-05-10 ENCOUNTER — Encounter: Payer: Self-pay | Admitting: Internal Medicine

## 2016-05-10 VITALS — BP 135/66 | HR 63 | Temp 97.9°F | Resp 18 | Ht 67.0 in | Wt 144.8 lb

## 2016-05-10 DIAGNOSIS — Z8546 Personal history of malignant neoplasm of prostate: Secondary | ICD-10-CM | POA: Diagnosis not present

## 2016-05-10 DIAGNOSIS — D649 Anemia, unspecified: Secondary | ICD-10-CM

## 2016-05-10 DIAGNOSIS — N289 Disorder of kidney and ureter, unspecified: Secondary | ICD-10-CM

## 2016-05-10 DIAGNOSIS — C3491 Malignant neoplasm of unspecified part of right bronchus or lung: Secondary | ICD-10-CM

## 2016-05-10 DIAGNOSIS — R5383 Other fatigue: Secondary | ICD-10-CM

## 2016-05-10 DIAGNOSIS — Z85118 Personal history of other malignant neoplasm of bronchus and lung: Secondary | ICD-10-CM | POA: Diagnosis not present

## 2016-05-10 NOTE — Telephone Encounter (Signed)
Avs report and appointment schedule given to patient, per 05/10/16 los.

## 2016-05-10 NOTE — Progress Notes (Signed)
[facility] [phone]   [phone]  OFFICE PROGRESS NOTE  Cathlean Cower, MD [place] [address]  DIAGNOSIS:  1) Stage IA (T1a, N0, M0) non-small cell lung cancer, well-differentiated adenocarcinoma diagnosed in July 2016. 2) history of prostate adenocarcinoma managed by Dr. Tresa Moore [facility] the [facility]  PRIOR THERAPY: Status post right lower lobe superior segmentectomy with lymph node sampling under the care of Dr. Roxan Hockey on 03/22/2015.  CURRENT THERAPY: Observation.  INTERVAL HISTORY: Victor Davis 75 y.o. male returns to the clinic today for six-month follow-up visit accompanied by his wife. The patient is feeling fine today except for fatigue. His renal function is also declining and he is followed by nephrology. He denied having any significant chest pain but has shortness breath with exertion with no cough or hemoptysis. He has no fever or chills. He has no nausea or vomiting. He was supposed to have repeat CT scan of the chest on 05/02/2016 but this was delayed to be performed tomorrow with CT of the abdomen and pelvis ordered by Dr. Tresa Moore for evaluation of his prostate cancer.  MEDICAL HISTORY: Past Medical History:  Diagnosis Date  . BRADYCARDIA 10/24/2010  . CALF PAIN, RIGHT 10/22/2009  . 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 CKD (chronic kidney disease), stage II   . CKD (chronic kidney disease), stage III 02/04/2016  . Coronary artery disease     95% followed by 80% mid LAD stenosis, circumflex 80% stenosis. There was a moderate size branching obtuse marginal with 90-95% stenosis in the inferior branch. The right coronary artery had proximal 30% stenosis. Posterior lateral was moderate-sized with 40% stenosis. The EF was 65-70%. He had a DES and  x 2 to the LAD and DES x 2 to the circumflex 03/08/12.    [name] Dysuria 10/10/2010  . Emphysema 04/01/2012   By CT chest, august 2013  . ERECTILE DYSFUNCTION 04/05/2007  .  FATIGUE 10/02/2007  . GERD (gastroesophageal reflux disease)   . GLUCOSE INTOLERANCE 07/15/2008  . HYPERLIPIDEMIA 07/30/2008  . HYPERSOMNIA 10/02/2007  . Impaired glucose tolerance 02/09/2011  . INSOMNIA-SLEEP DISORDER-UNSPEC 10/22/2009  . LOW BACK PAIN 04/05/2007  . OSTEOARTHRITIS 04/05/2007  . PEPTIC ULCER DISEASE 04/05/2007  . PERIPHERAL EDEMA 10/22/2009  . PERIPHERAL VASCULAR DISEASE 07/30/2008  . PROSTATE CANCER, HX OF 04/05/2007  . RESTLESS LEG SYNDROME 05/15/2007  . RHINITIS, ALLERGIC NOS 05/15/2007  . SHOULDER PAIN, LEFT 01/22/2008  . SINUSITIS- ACUTE-NOS 05/15/2007  . Unspecified essential hypertension 04/05/2007  . Unspecified visual loss 07/15/2008  . UTI 10/24/2010    ALLERGIES:  is allergic to amitriptyline hcl; diphenhydramine hcl; simvastatin; and tylenol [acetaminophen].  MEDICATIONS:  Current Outpatient Prescriptions  Medication Sig Dispense Refill  . amLODipine (NORVASC) 10 MG tablet Take 1 tablet (10 mg total) by mouth daily. 90 tablet 3  . aspirin 81 MG EC tablet Take 81 mg by mouth daily.      . citalopram (CELEXA) 40 MG tablet Take 1 tablet (40 mg total) by mouth daily. 30 tablet 5  . cloNIDine (CATAPRES) 0.1 MG tablet TAKE 1 TABLET BY MOUTH IN  THE MORNING THEN TAKE 2  TABLETS IN THE EVENING 240 tablet 0  . clopidogrel (PLAVIX) 75 MG tablet Take 1 tablet (75 mg total) by mouth daily. 90 tablet 1  . Cyanocobalamin (VITAMIN B-12 PO) Take 1 tablet by mouth daily.    [name] donepezil (ARICEPT) 5 MG tablet Take 1 tablet (5 mg total)  by mouth at bedtime. 90 tablet 3  . gabapentin (NEURONTIN) 100 MG capsule Take 1 capsule (100 mg total) by mouth 2 (two) times daily. 180 capsule 1  . GuaiFENesin (MUCINEX PO) Take 1 tablet by mouth every 12 (twelve) hours as needed (cold symptoms).    . isosorbide mononitrate (IMDUR) 30 MG 24 hr tablet Take 1 tablet (30 mg total) by mouth daily. 90 tablet 1  . loratadine (CLARITIN) 10 MG tablet Take 10 mg by mouth daily.    [name] lovastatin (MEVACOR) 40 MG  tablet Take 1 tablet (40 mg total) by mouth at bedtime. 30 tablet 9  . NITROSTAT 0.4 MG SL tablet PLACE 1 TABLET (0.4 MG TOTAL) UNDER THE TONGUE EVERY 5 (FIVE) MINUTES AS NEEDED FOR CHEST PAIN. 25 tablet 5  . pantoprazole (PROTONIX) 40 MG tablet Take 1 tablet (40 mg total) by mouth daily. 90 tablet 1  . pramipexole (MIRAPEX) 1 MG tablet Take 1 tablet (1 mg total) by mouth 3 (three) times daily. 180 tablet 1  . traMADol (ULTRAM) 50 MG tablet Take 1 tablet (50 mg total) by mouth every 8 (eight) hours as needed. 60 tablet 1   No current facility-administered medications for this visit.     SURGICAL HISTORY:  Past Surgical History:  Procedure Laterality Date  . EYE SURGERY     right  . Inguinal herniorrhapy left  2002   x 2  . KNEE SURGERY     left  . Left hip replacement    . LYMPH NODE DISSECTION Right 03/22/2015   Procedure: LYMPH NODE DISSECTION;  Surgeon: Melrose Nakayama, MD;  Location: [facility];  Service: Thoracic;  Laterality: Right;  . PERCUTANEOUS CORONARY STENT INTERVENTION (PCI-S) N/A 03/08/2012   Procedure: PERCUTANEOUS CORONARY STENT INTERVENTION (PCI-S);  Surgeon: Burnell Blanks, MD;  Location: [facility] CATH LAB;  Service: Cardiovascular;  Laterality: N/A;  . PROSTATECTOMY  2002   radical  . Right hip replacement  09/22/09  . ROTATOR CUFF REPAIR     left  . s/p ventral surgery  2009  . SEGMENTECOMY Right 03/22/2015   Procedure: RIGHT LOWER LOBE SUPERIOR SEGMENTECTOMY;  Surgeon: Melrose Nakayama, MD;  Location: [facility];  Service: Thoracic;  Laterality: Right;  [name] VIDEO ASSISTED THORACOSCOPY Right 03/22/2015   Procedure: RIGHT VIDEO ASSISTED THORACOSCOPY;  Surgeon: Melrose Nakayama, MD;  Location: [facility];  Service: Thoracic;  Laterality: Right;    REVIEW OF SYSTEMS:  A comprehensive review of systems was negative except for: Constitutional: positive for fatigue   PHYSICAL EXAMINATION: General appearance: alert, cooperative, fatigued and no distress Head: Normocephalic,  without obvious abnormality, atraumatic Neck: no adenopathy, no JVD, supple, symmetrical, trachea midline and thyroid not enlarged, symmetric, no tenderness/mass/nodules Lymph nodes: Cervical, supraclavicular, and axillary nodes normal. Resp: clear to auscultation bilaterally Back: symmetric, no curvature. ROM normal. No CVA tenderness. Cardio: regular rate and rhythm, S1, S2 normal, no murmur, click, rub or gallop GI: soft, non-tender; bowel sounds normal; no masses,  no organomegaly Extremities: extremities normal, atraumatic, no cyanosis or edema  ECOG PERFORMANCE STATUS: 1 - Symptomatic but completely ambulatory  Blood pressure 135/66, pulse 63, temperature 97.9 F (36.6 C), temperature source Oral, resp. rate 18, height '5\' 7"'$  (1.702 m), weight 144 lb 12.8 oz (65.7 kg), SpO2 100 %.  LABORATORY DATA: Lab Results  Component Value Date   WBC 6.0 05/03/2016   HGB 10.1 (L) 05/03/2016   HCT 30.5 (L) 05/03/2016   MCV 87.6 05/03/2016   PLT 126 (  L) 05/03/2016      Chemistry      Component Value Date/Time   NA 140 05/03/2016 1245   K 4.1 05/03/2016 1245   CL 107 04/06/2016 1156   CO2 25 05/03/2016 1245   BUN 30.4 (H) 05/03/2016 1245   CREATININE 2.3 (H) 05/03/2016 1245      Component Value Date/Time   CALCIUM 8.8 05/03/2016 1245   ALKPHOS 85 05/03/2016 1245   AST 14 05/03/2016 1245   ALT 10 05/03/2016 1245   BILITOT 0.75 05/03/2016 1245       RADIOGRAPHIC STUDIES: No results found.  ASSESSMENT AND PLAN: This is a very pleasant 75 years old white male diagnosed with a stage IA non-small cell lung cancer, adenocarcinoma status post right lower lobectomy with lymph node sampling in August 2016. He is scheduled to have repeat CT scan of the chest, abdomen and pelvis tomorrow. If no evidence for disease recurrence, I would see the patient back for follow-up visit in 6 months with repeat CT scan of the chest. For the fatigue and persistent anemia, I advised the patient to start  taking over-the-counter oral iron tablet twice a day. For the renal insufficiency he is followed by nephrology. For the history of prostate cancer, he will continue his follow-up visit and evaluation by Dr. Tresa Moore. He was advised to call immediately if he has any concerning symptoms in the interval. The patient voices understanding of current disease status and treatment options and is in agreement with the current care plan.  All questions were answered. The patient knows to call the clinic with any problems, questions or concerns. We can certainly see the patient much sooner if necessary.   Disclaimer: This note was dictated with voice recognition software. Similar sounding words can inadvertently be transcribed and may not be corrected upon review.

## 2016-05-11 ENCOUNTER — Encounter (HOSPITAL_COMMUNITY)
Admission: RE | Admit: 2016-05-11 | Discharge: 2016-05-11 | Disposition: A | Discharge status: Home / Self Care | Payer: Medicare Other | Source: Ambulatory Visit | Attending: Urology | Admitting: Urology

## 2016-05-11 DIAGNOSIS — C61 Malignant neoplasm of prostate: Secondary | ICD-10-CM | POA: Diagnosis not present

## 2016-05-11 DIAGNOSIS — C3490 Malignant neoplasm of unspecified part of unspecified bronchus or lung: Secondary | ICD-10-CM | POA: Diagnosis not present

## 2016-05-11 MED ORDER — TECHNETIUM TC 99M MEDRONATE IV KIT: 25.0000 | PACK | Freq: Once | INTRAVENOUS | Status: DC | PRN

## 2016-05-12 DIAGNOSIS — C61 Malignant neoplasm of prostate: Secondary | ICD-10-CM | POA: Diagnosis not present

## 2016-05-12 DIAGNOSIS — I10 Essential (primary) hypertension: Secondary | ICD-10-CM | POA: Diagnosis not present

## 2016-05-12 DIAGNOSIS — C3490 Malignant neoplasm of unspecified part of unspecified bronchus or lung: Secondary | ICD-10-CM | POA: Diagnosis not present

## 2016-05-12 DIAGNOSIS — N183 Chronic kidney disease, stage 3 (moderate): Secondary | ICD-10-CM | POA: Diagnosis not present

## 2016-05-15 ENCOUNTER — Other Ambulatory Visit: Payer: Self-pay | Admitting: Nephrology

## 2016-05-15 DIAGNOSIS — N183 Chronic kidney disease, stage 3 unspecified: Secondary | ICD-10-CM

## 2016-05-22 ENCOUNTER — Ambulatory Visit
Admission: RE | Admit: 2016-05-22 | Discharge: 2016-05-22 | Disposition: A | Discharge status: Home / Self Care | Payer: Medicare Other | Source: Ambulatory Visit | Attending: Nephrology | Admitting: Nephrology

## 2016-05-22 DIAGNOSIS — N183 Chronic kidney disease, stage 3 unspecified: Secondary | ICD-10-CM

## 2016-05-22 DIAGNOSIS — N189 Chronic kidney disease, unspecified: Secondary | ICD-10-CM | POA: Diagnosis not present

## 2016-05-23 ENCOUNTER — Ambulatory Visit (INDEPENDENT_AMBULATORY_CARE_PROVIDER_SITE_OTHER): Payer: Medicare Other | Admitting: Thoracic Surgery (Cardiothoracic Vascular Surgery)

## 2016-05-23 ENCOUNTER — Encounter: Payer: Self-pay | Admitting: Thoracic Surgery (Cardiothoracic Vascular Surgery)

## 2016-05-23 VITALS — BP 91/59 | HR 67 | Resp 16 | Ht 67.0 in | Wt 148.0 lb

## 2016-05-23 DIAGNOSIS — Z902 Acquired absence of lung [part of]: Secondary | ICD-10-CM

## 2016-05-23 DIAGNOSIS — C3491 Malignant neoplasm of unspecified part of right bronchus or lung: Secondary | ICD-10-CM

## 2016-05-23 NOTE — Progress Notes (Signed)
[address]       [place] [address]             [phone]       HPI: [name] returns for a 1 year follow-up visit.  He is a 75 year old man who had a thoracoscopic right lower lobe superior segmentectomy in August 2016 for a stage IA non-small cell carcinoma. He saw Dr. Julien Nordmann postop. He did not require any adjuvant therapy.  His past medical history is also significant for coronary disease, mitral regurgitation, emphysema, and stage III chronic kidney disease and his creatinine recently rising from 1.8-2.3. He had a CT of the chest abdomen and pelvis on 05/10/2016.  He says he has been feeling "okay." He doesn't have much of an appetite. He says he has lost about 3 pounds in the last couple of months. He doesn't have any pain related to his surgery. His breathing has been "okay." He denies any chest pain, pressure, or tightness.  Past Medical History:  Diagnosis Date  . BRADYCARDIA 10/24/2010  . CALF PAIN, RIGHT 10/22/2009  . 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 CKD (chronic kidney disease), stage II   . CKD (chronic kidney disease), stage III 02/04/2016  . Coronary artery disease     95% followed by 80% mid LAD stenosis, circumflex 80% stenosis. There was a moderate size branching obtuse marginal with 90-95% stenosis in the inferior branch. The right coronary artery had proximal 30% stenosis. Posterior lateral was moderate-sized with 40% stenosis. The EF was 65-70%. He had a DES and  x 2 to the LAD and DES x 2 to the circumflex 03/08/12.    [name] Dysuria 10/10/2010  . Emphysema 04/01/2012   By CT chest, august 2013  . ERECTILE DYSFUNCTION 04/05/2007  . FATIGUE 10/02/2007  . GERD (gastroesophageal reflux disease)   . GLUCOSE INTOLERANCE 07/15/2008  . HYPERLIPIDEMIA 07/30/2008  . HYPERSOMNIA 10/02/2007  . Impaired glucose tolerance 02/09/2011  . INSOMNIA-SLEEP DISORDER-UNSPEC 10/22/2009  . LOW BACK PAIN 04/05/2007  . OSTEOARTHRITIS 04/05/2007  . PEPTIC ULCER  DISEASE 04/05/2007  . PERIPHERAL EDEMA 10/22/2009  . PERIPHERAL VASCULAR DISEASE 07/30/2008  . PROSTATE CANCER, HX OF 04/05/2007  . RESTLESS LEG SYNDROME 05/15/2007  . RHINITIS, ALLERGIC NOS 05/15/2007  . SHOULDER PAIN, LEFT 01/22/2008  . SINUSITIS- ACUTE-NOS 05/15/2007  . Unspecified essential hypertension 04/05/2007  . Unspecified visual loss 07/15/2008  . UTI 10/24/2010      Current Outpatient Prescriptions  Medication Sig Dispense Refill  . amLODipine (NORVASC) 10 MG tablet Take 1 tablet (10 mg total) by mouth daily. 90 tablet 3  . aspirin 81 MG EC tablet Take 81 mg by mouth daily.      . citalopram (CELEXA) 40 MG tablet Take 1 tablet (40 mg total) by mouth daily. 30 tablet 5  . cloNIDine (CATAPRES) 0.1 MG tablet TAKE 1 TABLET BY MOUTH IN  THE MORNING THEN TAKE 2  TABLETS IN THE EVENING 240 tablet 0  . clopidogrel (PLAVIX) 75 MG tablet Take 1 tablet (75 mg total) by mouth daily. 90 tablet 1  . Cyanocobalamin (VITAMIN B-12 PO) Take 1 tablet by mouth daily.    [name] donepezil (ARICEPT) 5 MG tablet Take 1 tablet (5 mg total) by mouth at bedtime. 90 tablet 3  . gabapentin (NEURONTIN) 100 MG capsule Take 1 capsule (100 mg total) by mouth 2 (two) times daily. 180 capsule 1  . GuaiFENesin (MUCINEX PO) Take 1 tablet by mouth every 12 (twelve) hours  as needed (cold symptoms).    . isosorbide mononitrate (IMDUR) 30 MG 24 hr tablet Take 1 tablet (30 mg total) by mouth daily. 90 tablet 1  . loratadine (CLARITIN) 10 MG tablet Take 10 mg by mouth daily.    [name] lovastatin (MEVACOR) 40 MG tablet Take 1 tablet (40 mg total) by mouth at bedtime. 30 tablet 9  . NITROSTAT 0.4 MG SL tablet PLACE 1 TABLET (0.4 MG TOTAL) UNDER THE TONGUE EVERY 5 (FIVE) MINUTES AS NEEDED FOR CHEST PAIN. 25 tablet 5  . pantoprazole (PROTONIX) 40 MG tablet Take 1 tablet (40 mg total) by mouth daily. 90 tablet 1  . pramipexole (MIRAPEX) 1 MG tablet Take 1 tablet (1 mg total) by mouth 3 (three) times daily. 180 tablet 1  . traMADol (ULTRAM)  50 MG tablet Take 1 tablet (50 mg total) by mouth every 8 (eight) hours as needed. 60 tablet 1   No current facility-administered medications for this visit.     Physical Exam BP (!) 91/59   Pulse 67   Resp 16   Ht '5\' 7"'$  (1.702 m)   Wt 148 lb (67.1 kg)   SpO2 96% Comment: ON RA  BMI 23.18 kg/m  Elderly man in no acute distress Alert and oriented 3 with no focal motor deficits No palpable cervical or subclavicular adenopathy Cardiac regular rate and rhythm with 2/6 systolic murmur Lungs diminished but equal bilaterally, no wheezing  Diagnostic Tests: I personally reviewed the CT chest abdomen and pelvis done on 05/10/2016. I concur with her radiologist findings as regards the chest CT. There is no evidence of recurrent disease. There is atherosclerotic aortic and coronary disease.  Impression: 75 year old man who is now a year out from a right lower lobe superior segmentectomy for a stage IA non-small cell carcinoma. He has no evidence of recurrent disease.  He will see Dr. Julien Nordmann at in 6 months with a CT chest.  I'll plan to see him back in a year.  I recommended that he check with his nephrologist to see if there is any supplement that would be safe for him to use to help maintain his weight.   Melrose Nakayama, MD [facility] [phone]

## 2016-06-07 ENCOUNTER — Ambulatory Visit (INDEPENDENT_AMBULATORY_CARE_PROVIDER_SITE_OTHER): Payer: Medicare Other | Admitting: Pulmonary Disease

## 2016-06-07 ENCOUNTER — Encounter: Payer: Self-pay | Admitting: Pulmonary Disease

## 2016-06-07 VITALS — BP 118/60 | HR 50 | Ht 67.0 in | Wt 146.0 lb

## 2016-06-07 DIAGNOSIS — G4710 Hypersomnia, unspecified: Secondary | ICD-10-CM

## 2016-06-07 DIAGNOSIS — C3491 Malignant neoplasm of unspecified part of right bronchus or lung: Secondary | ICD-10-CM | POA: Diagnosis not present

## 2016-06-07 NOTE — Assessment & Plan Note (Signed)
Patient has snoring, no witnessed apneas, occasional gasping or choking. Sleeps at 10-11pm, has frequent awakenings for no apparent reason. Ends up sleeping 5 hrs/night. Feels unrefreshed sometimes. Naps daily in pm. Has hypersomnia which affects his fxnality.   (-) abnormal behavior in sleep.   ESS 10.   Plan :  We discussed about the diagnosis of Obstructive Sleep Apnea (OSA) and implications of untreated OSA. We discussed about CPAP and BiPaP as possible treatment options.    We will schedule the patient for a sleep study. Plan for a lab study. If sleep study is positive, he may need close follow-up as he might forget to use CPAP. May need to spend time again to explain CPAP treatment.    Patient was instructed to call the office if he/she has not heard back from the office 1-2 weeks after the sleep study.   Patient was instructed to call the office if he/she is having issues with the PAP device.   We discussed good sleep hygiene.   Patient was advised not to engage in activities requiring concentration and/or vigilance if he/she is sleepy.  Patient was advised not to drive if he/she is sleepy.

## 2016-06-07 NOTE — Progress Notes (Signed)
Subjective:    Patient ID: Victor Davis, male    DOB: 1941/07/21, 75 y.o.   MRN: [id number]  HPI   This is the case of Victor Davis, 75 y.o. Male, who was referred by Dr. Cathlean Cower in consultation regarding possible OSA.      As you very well know, patient has an 18PY smoking histroy, quit when he was 75 yrs old. Not been diagnosed with asthma or copd.   Patient has snoring, no witnessed apneas, occasional gasping or choking. Sleeps at 10-11pm, has frequent awakenings for no apparent reason. Ends up sleeping 5 hrs/night. Feels unrefreshed sometimes. Naps daily in pm. Has hypersomnia which affects his fxnality.   (-) abnormal behavior in sleep.   ESS 10.   He had lung CA 2 years ago for which he had resection of the CA and portion of the lung.   Review of Systems  Constitutional: Negative.  Negative for fever and unexpected weight change.  HENT: Positive for sinus pressure and sneezing. Negative for congestion, dental problem, ear pain, nosebleeds, postnasal drip, rhinorrhea, sore throat and trouble swallowing.   Eyes: Positive for redness. Negative for itching.  Respiratory: Negative.  Negative for cough, chest tightness, shortness of breath and wheezing.   Cardiovascular: Positive for leg swelling. Negative for palpitations.  Gastrointestinal: Negative.  Negative for nausea and vomiting.  Endocrine: Negative.   Genitourinary: Negative.  Negative for dysuria.  Musculoskeletal: Negative.  Negative for joint swelling.  Skin: Negative.  Negative for rash.  Allergic/Immunologic: Positive for environmental allergies.  Neurological: Positive for dizziness. Negative for headaches.  Hematological: Bruises/bleeds easily.  Psychiatric/Behavioral: Negative.  Negative for dysphoric mood. The patient is not nervous/anxious.    Past Medical History:  Diagnosis Date  . BRADYCARDIA 10/24/2010  . CALF PAIN, RIGHT 10/22/2009  . 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 CKD (chronic kidney  disease), stage II   . CKD (chronic kidney disease), stage III 02/04/2016  . Coronary artery disease     95% followed by 80% mid LAD stenosis, circumflex 80% stenosis. There was a moderate size branching obtuse marginal with 90-95% stenosis in the inferior branch. The right coronary artery had proximal 30% stenosis. Posterior lateral was moderate-sized with 40% stenosis. The EF was 65-70%. He had a DES and  x 2 to the LAD and DES x 2 to the circumflex 03/08/12.    [name] Dysuria 10/10/2010  . Emphysema 04/01/2012   By CT chest, august 2013  . ERECTILE DYSFUNCTION 04/05/2007  . FATIGUE 10/02/2007  . GERD (gastroesophageal reflux disease)   . GLUCOSE INTOLERANCE 07/15/2008  . HYPERLIPIDEMIA 07/30/2008  . HYPERSOMNIA 10/02/2007  . Impaired glucose tolerance 02/09/2011  . INSOMNIA-SLEEP DISORDER-UNSPEC 10/22/2009  . LOW BACK PAIN 04/05/2007  . OSTEOARTHRITIS 04/05/2007  . PEPTIC ULCER DISEASE 04/05/2007  . PERIPHERAL EDEMA 10/22/2009  . PERIPHERAL VASCULAR DISEASE 07/30/2008  . PROSTATE CANCER, HX OF 04/05/2007  . RESTLESS LEG SYNDROME 05/15/2007  . RHINITIS, ALLERGIC NOS 05/15/2007  . SHOULDER PAIN, LEFT 01/22/2008  . SINUSITIS- ACUTE-NOS 05/15/2007  . Unspecified essential hypertension 04/05/2007  . Unspecified visual loss 07/15/2008  . UTI 10/24/2010   (-) DVT.   Family History  Problem Relation Age of Onset  . Heart attack Father 32    Smoker  . Heart attack Brother 54  . Lung cancer Sister   . Colon cancer Neg Hx      Past Surgical History:  Procedure Laterality Date  . EYE SURGERY     right  .  Inguinal herniorrhapy left  2002   x 2  . KNEE SURGERY     left  . Left hip replacement    . LYMPH NODE DISSECTION Right 03/22/2015   Procedure: LYMPH NODE DISSECTION;  Surgeon: Melrose Nakayama, MD;  Location: [facility];  Service: Thoracic;  Laterality: Right;  . PERCUTANEOUS CORONARY STENT INTERVENTION (PCI-S) N/A 03/08/2012   Procedure: PERCUTANEOUS CORONARY STENT INTERVENTION (PCI-S);  Surgeon:  Burnell Blanks, MD;  Location: [facility] CATH LAB;  Service: Cardiovascular;  Laterality: N/A;  . PROSTATECTOMY  2002   radical  . Right hip replacement  09/22/09  . ROTATOR CUFF REPAIR     left  . s/p ventral surgery  2009  . SEGMENTECOMY Right 03/22/2015   Procedure: RIGHT LOWER LOBE SUPERIOR SEGMENTECTOMY;  Surgeon: Melrose Nakayama, MD;  Location: [place];  Service: Thoracic;  Laterality: Right;  [name] VIDEO ASSISTED THORACOSCOPY Right 03/22/2015   Procedure: RIGHT VIDEO ASSISTED THORACOSCOPY;  Surgeon: Melrose Nakayama, MD;  Location: [place];  Service: Thoracic;  Laterality: Right;    Social History   Social History  . Marital status: Married    Spouse name: N/A  . Number of children: 2  . Years of education: N/A   Occupational History  . truck driver-retired    Social History Main Topics  . Smoking status: Former Smoker    Packs/day: 1.00    Years: 15.00    Types: Cigarettes    Quit date: 02/05/1977  . Smokeless tobacco: Never Used  . Alcohol use 0.5 oz/week    1 Standard drinks or equivalent per week     Comment: RARE  . Drug use: No  . Sexual activity: Not Currently   Other Topics Concern  . Not on file   Social History Narrative  . No narrative on file   Lives in [place].  Drove a truck. Married.   Allergies  Allergen Reactions  . Amitriptyline Hcl Other (See Comments)    REACTION: confusion  . Diphenhydramine Hcl Other (See Comments)    Keeps him awake  . Simvastatin Other (See Comments)    REACTION: leg pain  . Tylenol [Acetaminophen] Other (See Comments)    Dr advised pt not to take due to finding a spot on pt's kidney     Outpatient Medications Prior to Visit  Medication Sig Dispense Refill  . amLODipine (NORVASC) 10 MG tablet Take 1 tablet (10 mg total) by mouth daily. 90 tablet 3  . aspirin 81 MG EC tablet Take 81 mg by mouth daily.      . citalopram (CELEXA) 40 MG tablet Take 1 tablet (40 mg total) by mouth daily. 30 tablet 5  . cloNIDine (CATAPRES)  0.1 MG tablet TAKE 1 TABLET BY MOUTH IN  THE MORNING THEN TAKE 2  TABLETS IN THE EVENING 240 tablet 0  . clopidogrel (PLAVIX) 75 MG tablet Take 1 tablet (75 mg total) by mouth daily. 90 tablet 1  . donepezil (ARICEPT) 5 MG tablet Take 1 tablet (5 mg total) by mouth at bedtime. 90 tablet 3  . gabapentin (NEURONTIN) 100 MG capsule Take 1 capsule (100 mg total) by mouth 2 (two) times daily. 180 capsule 1  . lovastatin (MEVACOR) 40 MG tablet Take 1 tablet (40 mg total) by mouth at bedtime. 30 tablet 9  . NITROSTAT 0.4 MG SL tablet PLACE 1 TABLET (0.4 MG TOTAL) UNDER THE TONGUE EVERY 5 (FIVE) MINUTES AS NEEDED FOR CHEST PAIN. 25 tablet 5  . pantoprazole (  PROTONIX) 40 MG tablet Take 1 tablet (40 mg total) by mouth daily. 90 tablet 1  . pramipexole (MIRAPEX) 1 MG tablet Take 1 tablet (1 mg total) by mouth 3 (three) times daily. 180 tablet 1  . traMADol (ULTRAM) 50 MG tablet Take 1 tablet (50 mg total) by mouth every 8 (eight) hours as needed. 60 tablet 1  . isosorbide mononitrate (IMDUR) 30 MG 24 hr tablet Take 1 tablet (30 mg total) by mouth daily. (Patient not taking: Reported on 06/07/2016) 90 tablet 1  . Cyanocobalamin (VITAMIN B-12 PO) Take 1 tablet by mouth daily.    . GuaiFENesin (MUCINEX PO) Take 1 tablet by mouth every 12 (twelve) hours as needed (cold symptoms).    . loratadine (CLARITIN) 10 MG tablet Take 10 mg by mouth daily.     No facility-administered medications prior to visit.    No orders of the defined types were placed in this encounter.       Objective:   Physical Exam  Vitals:  Vitals:   06/07/16 1339  BP: 118/60  Pulse: (!) 50  SpO2: 98%  Weight: 146 lb (66.2 kg)  Height: '5\' 7"'$  (1.702 m)    Constitutional/General:  Pleasant, well-nourished, well-developed, not in any distress,  Comfortably seating.  Well kempt  Body mass index is 22.87 kg/m. Wt Readings from Last 3 Encounters:  06/07/16 146 lb (66.2 kg)  05/23/16 148 lb (67.1 kg)  05/10/16 144 lb 12.8 oz  (65.7 kg)    HEENT: Pupils equal and reactive to light and accommodation. Anicteric sclerae. Normal nasal mucosa.   No oral  lesions,  mouth clear,  oropharynx clear, no postnasal drip. (-) Oral thrush. No dental caries.  Airway - Mallampati class III  Neck: No masses. Midline trachea. No JVD, (-) LAD. (-) bruits appreciated.  Respiratory/Chest: Grossly normal chest. (-) deformity. (-) Accessory muscle use.  Symmetric expansion. (-) Tenderness on palpation.  Resonant on percussion.  Diminished BS on both lower lung zones. (-) wheezing, crackles, rhonchi (-) egophony  Cardiovascular: Regular rate and  rhythm, heart sounds normal, no murmur or gallops, no peripheral edema  Gastrointestinal:  Normal bowel sounds. Soft, non-tender. No hepatosplenomegaly.  (-) masses.   Musculoskeletal:  Normal muscle tone. Normal gait.   Extremities: Grossly normal. (-) clubbing, cyanosis.  (-) edema  Skin: (-) rash,lesions seen.   Neurological/Psychiatric : alert, oriented to time, place, person. Normal mood and affect          Assessment & Plan:  Hypersomnia Patient has snoring, no witnessed apneas, occasional gasping or choking. Sleeps at 10-11pm, has frequent awakenings for no apparent reason. Ends up sleeping 5 hrs/night. Feels unrefreshed sometimes. Naps daily in pm. Has hypersomnia which affects his fxnality.   (-) abnormal behavior in sleep.   ESS 10.   Plan :  We discussed about the diagnosis of Obstructive Sleep Apnea (OSA) and implications of untreated OSA. We discussed about CPAP and BiPaP as possible treatment options.    We will schedule the patient for a sleep study. Plan for a lab study. If sleep study is positive, he may need close follow-up as he might forget to use CPAP. May need to spend time again to explain CPAP treatment.    Patient was instructed to call the office if he/she has not heard back from the office 1-2 weeks after the sleep study.   Patient was  instructed to call the office if he/she is having issues with the PAP device.   We  discussed good sleep hygiene.   Patient was advised not to engage in activities requiring concentration and/or vigilance if he/she is sleepy.  Patient was advised not to drive if he/she is sleepy.    Non-small cell carcinoma of right lung, stage 1 S/P resection of NSCLCA in 2016. St [facility]A. Follows with Dr. Earlie Server.      Thank you very much for letting me participate in this patient's care. Please do not hesitate to give me a call if you have any questions or concerns regarding the treatment plan.   Patient will follow up with me in 6-8 weeks.     Monica Becton, MD 06/07/2016   2:01 PM [facility] [place] Pager: [phone] Office: [phone], Fax: [phone]

## 2016-06-07 NOTE — Patient Instructions (Signed)
It was a pleasure taking care of you today!  We will schedule you to have a sleep study to determine if you have sleep apnea.    We will get a lab sleep study.  You will be scheduled to have a lab sleep study in 4-6 weeks.  Someone from the sleep lab will call you in 2-3 days to schedule the study with you.  They usually have cancellations every night so most likely, they will have openings for a lab sleep study next week or so.  We encourage you to do your sleep study then if possible. Please give us a call in a week is no one from the sleep lab calls you in 2-3 days.   If the sleep study is positive, we will order you a CPAP  machine.  Please call the office if you do NOT receive your machine in the next 1-2 weeks.   Please make sure you use your CPAP device everytime you sleep.  We will monitor the usage of your machine per your insurance requirement.  Your insurance company may take the machine from you if you are not using it regularly.   Please clean the mask, tubings, filter, water reservoir with soapy water every week.  Please use distilled water for the water reservoir.   Please call the office or your machine provider (DME company) if you are having issues with the device.    Return to clinic in 6-8 weeks with Dr. De Dios or NP   

## 2016-06-07 NOTE — Assessment & Plan Note (Signed)
S/P resection of NSCLCA in 2016. St 1A. Follows with Dr. Earlie Server.

## 2016-06-13 DIAGNOSIS — C61 Malignant neoplasm of prostate: Secondary | ICD-10-CM | POA: Diagnosis not present

## 2016-06-19 ENCOUNTER — Other Ambulatory Visit: Payer: Self-pay | Admitting: Internal Medicine

## 2016-06-19 DIAGNOSIS — C61 Malignant neoplasm of prostate: Secondary | ICD-10-CM | POA: Diagnosis not present

## 2016-06-19 DIAGNOSIS — N281 Cyst of kidney, acquired: Secondary | ICD-10-CM | POA: Diagnosis not present

## 2016-06-26 ENCOUNTER — Other Ambulatory Visit: Payer: Self-pay | Admitting: Internal Medicine

## 2016-06-30 ENCOUNTER — Other Ambulatory Visit: Payer: Self-pay | Admitting: Internal Medicine

## 2016-06-30 MED ORDER — CITALOPRAM HYDROBROMIDE 40 MG PO TABS: 40.0000 mg | ORAL_TABLET | Freq: Every day | ORAL | 3 refills | Status: DC

## 2016-06-30 NOTE — Telephone Encounter (Signed)
Denied refill. Pt should have refills and pt is due for an appt soon per last OV notes (q2month).   Forwarding to PCP for recommendation if any.

## 2016-06-30 NOTE — Telephone Encounter (Signed)
Staff to inquire about current policy regarding refills with office management please  My last understanding was refills were to be done as long as the pt has been seen within the 12 months, then month to month for 3 mo, then denied after 15 months only.  To my knowledge there has never been a office refill policy dependent on any note regarding requested ROV  Please let me know if office policy has changed  [place] for refill - done erx to [facility]

## 2016-07-30 ENCOUNTER — Encounter (HOSPITAL_BASED_OUTPATIENT_CLINIC_OR_DEPARTMENT_OTHER): Payer: Medicare Other

## 2016-08-09 ENCOUNTER — Telehealth: Payer: Self-pay

## 2016-08-09 NOTE — Telephone Encounter (Signed)
Tried to call pt. But not able to leave a vm  Pt. Was suppose to have a sleep study but  Looks like he canceled it. Need to know if pt. Still wanted to come in for his appointment to tomorrow to discuss further options. Or did he just want to cancel his appointment and reschedule his sleep study

## 2016-08-10 ENCOUNTER — Ambulatory Visit: Payer: Medicare Other | Admitting: Pulmonary Disease

## 2016-08-29 ENCOUNTER — Other Ambulatory Visit: Payer: Self-pay | Admitting: Internal Medicine

## 2016-09-04 DIAGNOSIS — N183 Chronic kidney disease, stage 3 (moderate): Secondary | ICD-10-CM | POA: Diagnosis not present

## 2016-09-09 ENCOUNTER — Ambulatory Visit (INDEPENDENT_AMBULATORY_CARE_PROVIDER_SITE_OTHER): Payer: Medicare Other | Admitting: Family Medicine

## 2016-09-09 ENCOUNTER — Encounter: Payer: Self-pay | Admitting: Family Medicine

## 2016-09-09 VITALS — BP 166/68 | HR 75 | Temp 98.2°F | Wt 148.2 lb

## 2016-09-09 DIAGNOSIS — J439 Emphysema, unspecified: Secondary | ICD-10-CM | POA: Diagnosis not present

## 2016-09-09 DIAGNOSIS — H8309 Labyrinthitis, unspecified ear: Secondary | ICD-10-CM

## 2016-09-09 DIAGNOSIS — C3491 Malignant neoplasm of unspecified part of right bronchus or lung: Secondary | ICD-10-CM | POA: Diagnosis not present

## 2016-09-09 DIAGNOSIS — B349 Viral infection, unspecified: Secondary | ICD-10-CM | POA: Diagnosis not present

## 2016-09-09 MED ORDER — MECLIZINE HCL 25 MG PO TABS: 12.5000 mg | ORAL_TABLET | Freq: Three times a day (TID) | ORAL | 0 refills | Status: DC | PRN

## 2016-09-09 NOTE — Progress Notes (Signed)
Pre visit review using our clinic review tool, if applicable. No additional management support is needed unless otherwise documented below in the visit note. 

## 2016-09-09 NOTE — Progress Notes (Signed)
Dr. Frederico Hamman T. Jarrah Seher, MD, [facility] [place], [address] Phone: [phone] Fax: [phone]  09/09/2016  Patient: Victor Davis, MRN: [id number], DOB: 05/21/1941, 76 y.o.  Primary Physician:  Cathlean Cower, MD   Chief Complaint  Patient presents with  . Fever  . Dizziness    fell because of the dizziness   Subjective:   Victor Davis is a 76 y.o. very pleasant male patient who presents with the following:  Has been having some vertigo, fell twice. This started after he got sick and overall started to feel bad.  When gets up will wobble and no sense of balance.  Felt warm and temp within the last few days ?  Gave some vertigo pills of some type from his wife.  Also tired and wants to sleep.   Having some cough and earache.  Has not thrown up since last Saturday.   Past Medical History, Surgical History, Social History, Family History, Problem List, Medications, and Allergies have been reviewed and updated if relevant.  Patient Active Problem List   Diagnosis Date Noted  . CKD (chronic kidney disease), stage III 02/04/2016  . Snoring 01/21/2016  . Mild cognitive impairment 10/08/2015  . Multiple bruises 10/08/2015  . Non-small cell carcinoma of right lung, stage 1 ([place]) 04/29/2015  . Lung nodule 03/22/2015  . Cholecystostomy drain infection ([place]) 01/25/2015  . Abdominal pain 01/18/2015  . Cholecystitis 01/18/2015  . Acute on chronic kidney failure ([place]) 01/18/2015  . Right upper quadrant pain   . Acute cholecystitis   . Renal cyst 12/31/2012  . Hearing loss 07/05/2012  . Lesion of nose 07/05/2012  . Anemia, unspecified 04/02/2012  . Pulmonary emphysema ([place]) 04/01/2012  . Hypertension 03/09/2012  . Pulmonary nodule, right 02/08/2012  . Mitral regurgitation 02/06/2012  . CAD (coronary artery disease) 01/04/2012  . Unspecified disorder of liver 12/18/2011  . Hyperthyroidism 12/18/2011  .  Nonspecific abnormal findings on radiological and examination of lung field 12/18/2011  . Impaired glucose tolerance 02/09/2011  . Preventative health care 02/09/2011  . BRADYCARDIA 10/24/2010  . INSOMNIA-SLEEP DISORDER-UNSPEC 10/22/2009  . PERIPHERAL EDEMA 10/22/2009  . HLD (hyperlipidemia) 07/30/2008  . Peripheral vascular disease ([place]) 07/30/2008  . 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 Unspecified visual loss 07/15/2008  . SHOULDER PAIN, LEFT 01/22/2008  . Hypersomnia 10/02/2007  . RESTLESS LEG SYNDROME 05/15/2007  . RHINITIS, ALLERGIC NOS 05/15/2007  . ERECTILE DYSFUNCTION 04/05/2007  . PEPTIC ULCER DISEASE 04/05/2007  . Osteoarthritis 04/05/2007  . LOW BACK PAIN 04/05/2007  . PROSTATE CANCER, HX OF 04/05/2007    Past Medical History:  Diagnosis Date  . BRADYCARDIA 10/24/2010  . CALF PAIN, RIGHT 10/22/2009  . 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 CKD (chronic kidney disease), stage II   . CKD (chronic kidney disease), stage III 02/04/2016  . Coronary artery disease     95% followed by 80% mid LAD stenosis, circumflex 80% stenosis. There was a moderate size branching obtuse marginal with 90-95% stenosis in the inferior branch. The right coronary artery had proximal 30% stenosis. Posterior lateral was moderate-sized with 40% stenosis. The EF was 65-70%. He had a DES and  x 2 to the LAD and DES x 2 to the circumflex 03/08/12.    [name] Dysuria 10/10/2010  . Emphysema 04/01/2012   By CT chest, august 2013  . ERECTILE DYSFUNCTION 04/05/2007  . FATIGUE 10/02/2007  . GERD (gastroesophageal reflux disease)   . GLUCOSE INTOLERANCE 07/15/2008  .  HYPERLIPIDEMIA 07/30/2008  . HYPERSOMNIA 10/02/2007  . Impaired glucose tolerance 02/09/2011  . INSOMNIA-SLEEP DISORDER-UNSPEC 10/22/2009  . LOW BACK PAIN 04/05/2007  . OSTEOARTHRITIS 04/05/2007  . PEPTIC ULCER DISEASE 04/05/2007  . PERIPHERAL EDEMA 10/22/2009  . PERIPHERAL VASCULAR DISEASE 07/30/2008  . PROSTATE CANCER, HX OF 04/05/2007  . RESTLESS LEG  SYNDROME 05/15/2007  . RHINITIS, ALLERGIC NOS 05/15/2007  . SHOULDER PAIN, LEFT 01/22/2008  . SINUSITIS- ACUTE-NOS 05/15/2007  . Unspecified essential hypertension 04/05/2007  . Unspecified visual loss 07/15/2008  . UTI 10/24/2010    Past Surgical History:  Procedure Laterality Date  . EYE SURGERY     right  . Inguinal herniorrhapy left  2002   x 2  . KNEE SURGERY     left  . Left hip replacement    . LYMPH NODE DISSECTION Right 03/22/2015   Procedure: LYMPH NODE DISSECTION;  Surgeon: Melrose Nakayama, MD;  Location: [facility];  Service: Thoracic;  Laterality: Right;  . PERCUTANEOUS CORONARY STENT INTERVENTION (PCI-S) N/A 03/08/2012   Procedure: PERCUTANEOUS CORONARY STENT INTERVENTION (PCI-S);  Surgeon: Burnell Blanks, MD;  Location: [facility] CATH LAB;  Service: Cardiovascular;  Laterality: N/A;  . PROSTATECTOMY  2002   radical  . Right hip replacement  09/22/09  . ROTATOR CUFF REPAIR     left  . s/p ventral surgery  2009  . SEGMENTECOMY Right 03/22/2015   Procedure: RIGHT LOWER LOBE SUPERIOR SEGMENTECTOMY;  Surgeon: Melrose Nakayama, MD;  Location: [facility];  Service: Thoracic;  Laterality: Right;  [name] VIDEO ASSISTED THORACOSCOPY Right 03/22/2015   Procedure: RIGHT VIDEO ASSISTED THORACOSCOPY;  Surgeon: Melrose Nakayama, MD;  Location: [facility];  Service: Thoracic;  Laterality: Right;    Social History   Social History  . Marital status: Married    Spouse name: N/A  . Number of children: 2  . Years of education: N/A   Occupational History  . truck driver-retired    Social History Main Topics  . Smoking status: Former Smoker    Packs/day: 1.00    Years: 15.00    Types: Cigarettes    Quit date: 02/05/1977  . Smokeless tobacco: Never Used  . Alcohol use 0.5 oz/week    1 Standard drinks or equivalent per week     Comment: RARE  . Drug use: No  . Sexual activity: Not Currently   Other Topics Concern  . Not on file   Social History Narrative  . No narrative on file     Family History  Problem Relation Age of Onset  . Heart attack Father 75    Smoker  . Heart attack Brother 81  . Lung cancer Sister   . Colon cancer Neg Hx     Allergies  Allergen Reactions  . Amitriptyline Hcl Other (See Comments)    REACTION: confusion  . Diphenhydramine Hcl Other (See Comments)    Keeps him awake  . Simvastatin Other (See Comments)    REACTION: leg pain  . Tylenol [Acetaminophen] Other (See Comments)    Dr advised pt not to take due to finding a spot on pt's kidney    Medication list reviewed and updated in full in [place].  ROS: GEN: Acute illness details above GI: Tolerating PO intake GU: maintaining adequate hydration and urination Pulm: No SOB Interactive and getting along well at home.  Otherwise, ROS is as per the HPI.  Objective:   BP (!) 166/68 (BP Location: Right Arm, Patient Position: Sitting, Cuff Size: Normal)  Pulse 75   Temp 98.2 F (36.8 C) (Oral)   Wt 148 lb 4 oz (67.2 kg)   SpO2 94%   BMI 23.22 kg/m    Gen: WDWN, NAD; A & O x3, cooperative. Pleasant.Globally Non-toxic HEENT: Normocephalic and atraumatic. Throat clear, w/o exudate, R TM clear, L TM - good landmarks, No fluid present. rhinnorhea.  MMM, inducible vertigo with mild head movement. Frontal sinuses: NT Max sinuses: NT NECK: Anterior cervical  LAD is absent CV: RRR, No M/G/R, cap refill <2 sec PULM: Breathing comfortably in no respiratory distress. no wheezing, crackles, rhonchi EXT: No c/c/e PSYCH: Friendly, good eye contact MSK: Nml gait     Laboratory and Imaging Data:  Assessment and Plan:   Viral labyrinthitis, unspecified laterality  Viral syndrome  Pulmonary emphysema, unspecified emphysema type (HCC)  Non-small cell carcinoma of right lung, stage 1 (HCC)  The patient has been sick this week with a viral illness, and he tells me that he seems to be improving particularly today compared to yesterday.  The exception to this is his  vertigo.  I explained most likely viral labyrinthitis, and we will treat this supportively.  Several risk factors, but the patient is clinically improving from an illness standpoint, and I would expect his viral illness to improve without further intervention.  Follow-up: No Follow-up on file.  Meds ordered this encounter  Medications  . meclizine (ANTIVERT) 25 MG tablet    Sig: Take 0.5-1 tablets (12.5-25 mg total) by mouth 3 (three) times daily as needed for dizziness.    Dispense:  30 tablet    Refill:  0   There are no discontinued medications. No orders of the defined types were placed in this encounter.   Signed,  Maud Deed. Leeloo Silverthorne, MD   Allergies as of 09/09/2016      Reactions   Amitriptyline Hcl Other (See Comments)   REACTION: confusion   Diphenhydramine Hcl Other (See Comments)   Keeps him awake   Simvastatin Other (See Comments)   REACTION: leg pain   Tylenol [acetaminophen] Other (See Comments)   Dr advised pt not to take due to finding a spot on pt's kidney      Medication List       Accurate as of 09/09/16  3:02 PM. Always use your most recent med list.          amLODipine 10 MG tablet Commonly known as:  NORVASC Take 1 tablet (10 mg total) by mouth daily.   aspirin 81 MG EC tablet Take 81 mg by mouth daily.   citalopram 40 MG tablet Commonly known as:  CELEXA Take 1 tablet (40 mg total) by mouth daily.   cloNIDine 0.1 MG tablet Commonly known as:  CATAPRES TAKE 1 TABLET BY MOUTH IN  THE MORNING THEN TAKE 2  TABLETS IN THE EVENING   clopidogrel 75 MG tablet Commonly known as:  PLAVIX TAKE 1 TABLET BY MOUTH  DAILY   donepezil 5 MG tablet Commonly known as:  ARICEPT Take 1 tablet (5 mg total) by mouth at bedtime.   gabapentin 100 MG capsule Commonly known as:  NEURONTIN TAKE 1 CAPSULE BY MOUTH TWO TIMES DAILY   isosorbide mononitrate 30 MG 24 hr tablet Commonly known as:  IMDUR TAKE 1 TABLET BY MOUTH  DAILY   lovastatin 40 MG  tablet Commonly known as:  MEVACOR TAKE 1 TABLET BY MOUTH AT  BEDTIME   meclizine 25 MG tablet Commonly known as:  ANTIVERT Take 0.5-1  tablets (12.5-25 mg total) by mouth 3 (three) times daily as needed for dizziness.   NITROSTAT 0.4 MG SL tablet Generic drug:  nitroGLYCERIN PLACE 1 TABLET (0.4 MG TOTAL) UNDER THE TONGUE EVERY 5 (FIVE) MINUTES AS NEEDED FOR CHEST PAIN.   pantoprazole 40 MG tablet Commonly known as:  PROTONIX TAKE 1 TABLET BY MOUTH  DAILY   pramipexole 1 MG tablet Commonly known as:  MIRAPEX Take 1 tablet (1 mg total) by mouth 3 (three) times daily.   traMADol 50 MG tablet Commonly known as:  ULTRAM Take 1 tablet (50 mg total) by mouth every 8 (eight) hours as needed.

## 2016-09-11 DIAGNOSIS — I10 Essential (primary) hypertension: Secondary | ICD-10-CM | POA: Diagnosis not present

## 2016-09-11 DIAGNOSIS — C3490 Malignant neoplasm of unspecified part of unspecified bronchus or lung: Secondary | ICD-10-CM | POA: Diagnosis not present

## 2016-09-11 DIAGNOSIS — N183 Chronic kidney disease, stage 3 (moderate): Secondary | ICD-10-CM | POA: Diagnosis not present

## 2016-09-11 DIAGNOSIS — C61 Malignant neoplasm of prostate: Secondary | ICD-10-CM | POA: Diagnosis not present

## 2016-10-10 ENCOUNTER — Other Ambulatory Visit: Payer: Self-pay | Admitting: Internal Medicine

## 2016-10-17 ENCOUNTER — Other Ambulatory Visit: Payer: Self-pay | Admitting: Internal Medicine

## 2016-10-20 ENCOUNTER — Encounter: Payer: Self-pay | Admitting: Cardiovascular Disease

## 2016-10-22 NOTE — Progress Notes (Deleted)
No chief complaint on file.    History of Present Illness: 76 yo male with history of CAD, CVA, HTN, HLD, prostate CA. Lung cancer, carotid artery disease who is here today for cardiac follow up. He was seen as a new patient 02/06/12 for evaluation of abnormal chest CT. His chest CT showed calcification of the aorta and coronary arteries. He described occasional chest pains that occurred at rest and with exertion. LHC 02/12/12 showed severe LAD and Circumflex disease. I placed 2 DES in the LAD and 2 DES in the Circumflex. Carotid dopplers with stable moderate bilateral ICA stenosis April 2017. Possible syncopal event October 2016. Cardiac event monitor showed rare PVCs but no other arrhythmias and no high grade AV block.   He is here today for follow up. No exertional chest pains. His breathing is at baseline. He is feeling well overall. Rare dizziness.   Primary Care Physician: Cathlean Cower, MD  Past Medical History:  Diagnosis Date  . BRADYCARDIA 10/24/2010  . CALF PAIN, RIGHT 10/22/2009  . 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 CKD (chronic kidney disease), stage II   . CKD (chronic kidney disease), stage III 02/04/2016  . Coronary artery disease     95% followed by 80% mid LAD stenosis, circumflex 80% stenosis. There was a moderate size branching obtuse marginal with 90-95% stenosis in the inferior branch. The right coronary artery had proximal 30% stenosis. Posterior lateral was moderate-sized with 40% stenosis. The EF was 65-70%. He had a DES and  x 2 to the LAD and DES x 2 to the circumflex 03/08/12.    [name] Dysuria 10/10/2010  . Emphysema 04/01/2012   By CT chest, august 2013  . ERECTILE DYSFUNCTION 04/05/2007  . FATIGUE 10/02/2007  . GERD (gastroesophageal reflux disease)   . GLUCOSE INTOLERANCE 07/15/2008  . HYPERLIPIDEMIA 07/30/2008  . HYPERSOMNIA 10/02/2007  . Impaired glucose tolerance 02/09/2011  . INSOMNIA-SLEEP DISORDER-UNSPEC 10/22/2009  . LOW BACK PAIN 04/05/2007  . OSTEOARTHRITIS  04/05/2007  . PEPTIC ULCER DISEASE 04/05/2007  . PERIPHERAL EDEMA 10/22/2009  . PERIPHERAL VASCULAR DISEASE 07/30/2008  . PROSTATE CANCER, HX OF 04/05/2007  . RESTLESS LEG SYNDROME 05/15/2007  . RHINITIS, ALLERGIC NOS 05/15/2007  . SHOULDER PAIN, LEFT 01/22/2008  . SINUSITIS- ACUTE-NOS 05/15/2007  . Unspecified essential hypertension 04/05/2007  . Unspecified visual loss 07/15/2008  . UTI 10/24/2010    Past Surgical History:  Procedure Laterality Date  . EYE SURGERY     right  . Inguinal herniorrhapy left  2002   x 2  . KNEE SURGERY     left  . Left hip replacement    . LYMPH NODE DISSECTION Right 03/22/2015   Procedure: LYMPH NODE DISSECTION;  Surgeon: Melrose Nakayama, MD;  Location: [facility];  Service: Thoracic;  Laterality: Right;  . PERCUTANEOUS CORONARY STENT INTERVENTION (PCI-S) N/A 03/08/2012   Procedure: PERCUTANEOUS CORONARY STENT INTERVENTION (PCI-S);  Surgeon: Burnell Blanks, MD;  [facility] CATH LAB;  Service: Cardiovascular;  Laterality: N/A;  . PROSTATECTOMY  2002   radical  . Right hip replacement  09/22/09  . ROTATOR CUFF REPAIR     left  . s/p ventral surgery  2009  . SEGMENTECOMY Right 03/22/2015   Procedure: RIGHT LOWER LOBE SUPERIOR SEGMENTECTOMY;  Surgeon: Melrose Nakayama, MD;  Location: [facility];  Service: Thoracic;  Laterality: Right;  [name] VIDEO ASSISTED THORACOSCOPY Right 03/22/2015   Procedure: RIGHT VIDEO ASSISTED THORACOSCOPY;  Surgeon: Melrose Nakayama, MD;  Location: [facility];  Service: Thoracic;  Laterality: Right;    Current Outpatient Prescriptions  Medication Sig Dispense Refill  . amLODipine (NORVASC) 10 MG tablet Take 1 tablet (10 mg total) by mouth daily. 90 tablet 3  . aspirin 81 MG EC tablet Take 81 mg by mouth daily.      . citalopram (CELEXA) 40 MG tablet Take 1 tablet (40 mg total) by mouth daily. 90 tablet 3  . cloNIDine (CATAPRES) 0.1 MG tablet TAKE 1 TABLET BY MOUTH IN  THE MORNING THEN TAKE 2  TABLETS IN THE EVENING 240 tablet 0  .  clopidogrel (PLAVIX) 75 MG tablet TAKE 1 TABLET BY MOUTH  DAILY 90 tablet 1  . donepezil (ARICEPT) 5 MG tablet Take 1 tablet (5 mg total) by mouth at bedtime. 90 tablet 3  . gabapentin (NEURONTIN) 100 MG capsule TAKE 1 CAPSULE BY MOUTH TWO TIMES DAILY 180 capsule 1  . isosorbide mononitrate (IMDUR) 30 MG 24 hr tablet TAKE 1 TABLET BY MOUTH  DAILY 90 tablet 1  . lovastatin (MEVACOR) 40 MG tablet TAKE 1 TABLET BY MOUTH AT  BEDTIME 90 tablet 0  . meclizine (ANTIVERT) 25 MG tablet Take 0.5-1 tablets (12.5-25 mg total) by mouth 3 (three) times daily as needed for dizziness. 30 tablet 0  . NITROSTAT 0.4 MG SL tablet PLACE 1 TABLET (0.4 MG TOTAL) UNDER THE TONGUE EVERY 5 (FIVE) MINUTES AS NEEDED FOR CHEST PAIN. 25 tablet 5  . pantoprazole (PROTONIX) 40 MG tablet TAKE 1 TABLET BY MOUTH  DAILY 90 tablet 1  . pramipexole (MIRAPEX) 1 MG tablet TAKE 1 TABLET BY MOUTH 3  TIMES DAILY 270 tablet 1  . traMADol (ULTRAM) 50 MG tablet Take 1 tablet (50 mg total) by mouth every 8 (eight) hours as needed. 60 tablet 1   No current facility-administered medications for this visit.     Allergies  Allergen Reactions  . Amitriptyline Hcl Other (See Comments)    REACTION: confusion  . Diphenhydramine Hcl Other (See Comments)    Keeps him awake  . Simvastatin Other (See Comments)    REACTION: leg pain  . Tylenol [Acetaminophen] Other (See Comments)    Dr advised pt not to take due to finding a spot on pt's kidney    Social History   Social History  . Marital status: Married    Spouse name: N/A  . Number of children: 2  . Years of education: N/A   Occupational History  . truck driver-retired    Social History Main Topics  . Smoking status: Former Smoker    Packs/day: 1.00    Years: 15.00    Types: Cigarettes    Quit date: 02/05/1977  . Smokeless tobacco: Never Used  . Alcohol use 0.5 oz/week    1 Standard drinks or equivalent per week     Comment: RARE  . Drug use: No  . Sexual activity: Not  Currently   Other Topics Concern  . Not on file   Social History Narrative  . No narrative on file    Family History  Problem Relation Age of Onset  . Heart attack Father 20    Smoker  . Heart attack Brother 52  . Lung cancer Sister   . Colon cancer Neg Hx     Review of Systems:  As stated in the HPI and otherwise negative.   There were no vitals taken for this visit.  Physical Examination: General: Well developed, well nourished, NAD  HEENT: OP clear, mucus membranes moist  SKIN: warm,  dry. No rashes. Neuro: No focal deficits  Musculoskeletal: Muscle strength 5/5 all ext  Psychiatric: Mood and affect normal  Neck: No JVD, no carotid bruits, no thyromegaly, no lymphadenopathy.  Lungs:Clear bilaterally, no wheezes, rhonci, crackles Cardiovascular: Regular rate and rhythm. No murmurs, gallops or rubs. Abdomen:Soft. Bowel sounds present. Non-tender.  Extremities: No lower extremity edema. Pulses are 2 + in the bilateral DP/PT.  EKG:  EKG is not ordered today. The ekg ordered today demonstrates   Recent Labs: 05/03/2016: ALT 10; BUN 30.4; Creatinine 2.3; HGB 10.1; Platelets 126; Potassium 4.1; Sodium 140   Lipid Panel    Component Value Date/Time   CHOL 103 04/06/2016 1156   TRIG 66.0 04/06/2016 1156   HDL 36.50 (L) 04/06/2016 1156   CHOLHDL 3 04/06/2016 1156   VLDL 13.2 04/06/2016 1156   LDLCALC 53 04/06/2016 1156   LDLDIRECT 104.9 02/10/2011 1224     Wt Readings from Last 3 Encounters:  09/09/16 148 lb 4 oz (67.2 kg)  06/07/16 146 lb (66.2 kg)  05/23/16 148 lb (67.1 kg)     Other studies Reviewed: Additional studies/ records that were reviewed today include: . Review of the above records demonstrates:   Assessment and Plan:   1. CAD without angina: He has no chest pain suggestive of angina. Continue ASA and Plavix. No beta blocker with braydcardia.   2. HTN: BP is well controlled. Continue current meds.   3. HLD: Continue statin. Lipids well  controlled. LDL at goal.   4. Carotid artery disease: Bilateral moderate ICA stenosis by dopplers April 2017. Repeat April 2018. ***  Current medicines are reviewed at length with the patient today.  The patient does not have concerns regarding medicines.  The following changes have been made:  no change  Labs/ tests ordered today include:  No orders of the defined types were placed in this encounter.    Disposition:   FU with me in 12 months   Signed, Lauree Chandler, MD 10/22/2016 9:52 PM    [facility] [address], [place], [place]  [address] Phone: [phone]; Fax: [phone]

## 2016-10-23 ENCOUNTER — Ambulatory Visit: Payer: Medicare Other | Admitting: Cardiovascular Disease

## 2016-11-07 ENCOUNTER — Ambulatory Visit (HOSPITAL_COMMUNITY)
Admission: RE | Admit: 2016-11-07 | Discharge: 2016-11-07 | Disposition: A | Discharge status: Home / Self Care | Payer: Medicare Other | Source: Ambulatory Visit | Attending: Internal Medicine | Admitting: Internal Medicine

## 2016-11-07 ENCOUNTER — Other Ambulatory Visit (HOSPITAL_BASED_OUTPATIENT_CLINIC_OR_DEPARTMENT_OTHER): Payer: Medicare Other

## 2016-11-07 DIAGNOSIS — Z85118 Personal history of other malignant neoplasm of bronchus and lung: Secondary | ICD-10-CM

## 2016-11-07 DIAGNOSIS — K8020 Calculus of gallbladder without cholecystitis without obstruction: Secondary | ICD-10-CM | POA: Insufficient documentation

## 2016-11-07 DIAGNOSIS — D649 Anemia, unspecified: Secondary | ICD-10-CM

## 2016-11-07 DIAGNOSIS — Z8546 Personal history of malignant neoplasm of prostate: Secondary | ICD-10-CM

## 2016-11-07 DIAGNOSIS — C3491 Malignant neoplasm of unspecified part of right bronchus or lung: Secondary | ICD-10-CM | POA: Insufficient documentation

## 2016-11-07 DIAGNOSIS — J439 Emphysema, unspecified: Secondary | ICD-10-CM | POA: Insufficient documentation

## 2016-11-07 DIAGNOSIS — Z9889 Other specified postprocedural states: Secondary | ICD-10-CM | POA: Insufficient documentation

## 2016-11-07 DIAGNOSIS — C3490 Malignant neoplasm of unspecified part of unspecified bronchus or lung: Secondary | ICD-10-CM | POA: Diagnosis not present

## 2016-11-07 LAB — CBC WITH DIFFERENTIAL/PLATELET
BASO%: 0.5 % (ref 0.0–2.0)
Basophils Absolute: 0 10*3/uL (ref 0.0–0.1)
EOS%: 8.4 % — AB (ref 0.0–7.0)
Eosinophils Absolute: 0.4 10*3/uL (ref 0.0–0.5)
HCT: 29.2 % — ABNORMAL LOW (ref 38.4–49.9)
HGB: 9.6 g/dL — ABNORMAL LOW (ref 13.0–17.1)
LYMPH%: 22.2 % (ref 14.0–49.0)
MCH: 28.3 pg (ref 27.2–33.4)
MCHC: 33 g/dL (ref 32.0–36.0)
MCV: 85.9 fL (ref 79.3–98.0)
MONO#: 0.4 10*3/uL (ref 0.1–0.9)
MONO%: 7.9 % (ref 0.0–14.0)
NEUT%: 61 % (ref 39.0–75.0)
NEUTROS ABS: 2.9 10*3/uL (ref 1.5–6.5)
Platelets: 157 10*3/uL (ref 140–400)
RBC: 3.4 10*6/uL — ABNORMAL LOW (ref 4.20–5.82)
RDW: 14.1 % (ref 11.0–14.6)
WBC: 4.8 10*3/uL (ref 4.0–10.3)
lymph#: 1.1 10*3/uL (ref 0.9–3.3)

## 2016-11-07 LAB — COMPREHENSIVE METABOLIC PANEL
ALBUMIN: 3.4 g/dL — AB (ref 3.5–5.0)
ALK PHOS: 74 U/L (ref 40–150)
ALT: 7 U/L (ref 0–55)
AST: 12 U/L (ref 5–34)
Anion Gap: 8 mEq/L (ref 3–11)
BILIRUBIN TOTAL: 0.77 mg/dL (ref 0.20–1.20)
BUN: 24.2 mg/dL (ref 7.0–26.0)
CO2: 24 mEq/L (ref 22–29)
CREATININE: 1.9 mg/dL — AB (ref 0.7–1.3)
Calcium: 9.1 mg/dL (ref 8.4–10.4)
Chloride: 109 mEq/L (ref 98–109)
EGFR: 33 mL/min/{1.73_m2} — ABNORMAL LOW (ref >90)
GLUCOSE: 90 mg/dL (ref 70–140)
Potassium: 3.9 mEq/L (ref 3.5–5.1)
SODIUM: 140 meq/L (ref 136–145)
TOTAL PROTEIN: 6.2 g/dL — AB (ref 6.4–8.3)

## 2016-11-08 ENCOUNTER — Encounter: Payer: Self-pay | Admitting: Internal Medicine

## 2016-11-08 ENCOUNTER — Telehealth: Payer: Self-pay | Admitting: Internal Medicine

## 2016-11-08 ENCOUNTER — Ambulatory Visit (HOSPITAL_BASED_OUTPATIENT_CLINIC_OR_DEPARTMENT_OTHER): Payer: Medicare Other | Admitting: Internal Medicine

## 2016-11-08 DIAGNOSIS — Z85118 Personal history of other malignant neoplasm of bronchus and lung: Secondary | ICD-10-CM

## 2016-11-08 DIAGNOSIS — Z8546 Personal history of malignant neoplasm of prostate: Secondary | ICD-10-CM | POA: Diagnosis not present

## 2016-11-08 DIAGNOSIS — C3491 Malignant neoplasm of unspecified part of right bronchus or lung: Secondary | ICD-10-CM

## 2016-11-08 NOTE — Progress Notes (Signed)
[name] [phone]   [phone]  OFFICE PROGRESS NOTE  Cathlean Cower, MD [place] [address]  DIAGNOSIS:  1) Stage IA (T1a, N0, M0) non-small cell lung cancer, well-differentiated adenocarcinoma diagnosed in July 2016. 2) history of prostate adenocarcinoma managed by Dr. Tresa [facility]  PRIOR THERAPY: Status post right lower lobe superior segmentectomy with lymph node sampling under the care of Dr. Roxan Hockey on 03/22/2015.  CURRENT THERAPY: Observation.  INTERVAL HISTORY: Victor Davis 76 y.o. male returns to the clinic today for follow-up visit accompanied by his wife. The patient is doing fine today with no specific complaints except for occasional falls. He is more sleepy after taken Mevacor. He is also on Aricept for dementia. The patient denied having any chest pain or shortness of breath. He has no cough or hemoptysis. He denied having any significant nausea or vomiting. He had repeat CT scan of the chest performed recently and he is here for evaluation and discussion of his scan results.  MEDICAL HISTORY: Past Medical History:  Diagnosis Date  . BRADYCARDIA 10/24/2010  . CALF PAIN, RIGHT 10/22/2009  . 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 CKD (chronic kidney disease), stage II   . CKD (chronic kidney disease), stage III 02/04/2016  . Coronary artery disease     95% followed by 80% mid LAD stenosis, circumflex 80% stenosis. There was a moderate size branching obtuse marginal with 90-95% stenosis in the inferior branch. The right coronary artery had proximal 30% stenosis. Posterior lateral was moderate-sized with 40% stenosis. The EF was 65-70%. He had a DES and  x 2 to the LAD and DES x 2 to the circumflex 03/08/12.    [name] Dysuria 10/10/2010  . Emphysema 04/01/2012   By CT chest, august 2013  . ERECTILE DYSFUNCTION 04/05/2007  . FATIGUE 10/02/2007  . GERD (gastroesophageal reflux disease)   . GLUCOSE  INTOLERANCE 07/15/2008  . HYPERLIPIDEMIA 07/30/2008  . HYPERSOMNIA 10/02/2007  . Impaired glucose tolerance 02/09/2011  . INSOMNIA-SLEEP DISORDER-UNSPEC 10/22/2009  . LOW BACK PAIN 04/05/2007  . OSTEOARTHRITIS 04/05/2007  . PEPTIC ULCER DISEASE 04/05/2007  . PERIPHERAL EDEMA 10/22/2009  . PERIPHERAL VASCULAR DISEASE 07/30/2008  . PROSTATE CANCER, HX OF 04/05/2007  . RESTLESS LEG SYNDROME 05/15/2007  . RHINITIS, ALLERGIC NOS 05/15/2007  . SHOULDER PAIN, LEFT 01/22/2008  . SINUSITIS- ACUTE-NOS 05/15/2007  . Unspecified essential hypertension 04/05/2007  . Unspecified visual loss 07/15/2008  . UTI 10/24/2010    ALLERGIES:  is allergic to amitriptyline hcl; diphenhydramine hcl; simvastatin; and tylenol [acetaminophen].  MEDICATIONS:  Current Outpatient Prescriptions  Medication Sig Dispense Refill  . amLODipine (NORVASC) 10 MG tablet Take 1 tablet (10 mg total) by mouth daily. 90 tablet 3  . aspirin 81 MG EC tablet Take 81 mg by mouth daily.      . citalopram (CELEXA) 40 MG tablet Take 1 tablet (40 mg total) by mouth daily. 90 tablet 3  . cloNIDine (CATAPRES) 0.1 MG tablet TAKE 1 TABLET BY MOUTH IN  THE MORNING THEN TAKE 2  TABLETS IN THE EVENING 240 tablet 0  . clopidogrel (PLAVIX) 75 MG tablet TAKE 1 TABLET BY MOUTH  DAILY 90 tablet 1  . donepezil (ARICEPT) 5 MG tablet Take 1 tablet (5 mg total) by mouth at bedtime. 90 tablet 3  . gabapentin (NEURONTIN) 100 MG capsule TAKE 1 CAPSULE BY MOUTH TWO TIMES DAILY 180 capsule 1  . isosorbide mononitrate (IMDUR) 30  MG 24 hr tablet TAKE 1 TABLET BY MOUTH  DAILY 90 tablet 1  . lovastatin (MEVACOR) 40 MG tablet TAKE 1 TABLET BY MOUTH AT  BEDTIME 90 tablet 0  . meclizine (ANTIVERT) 25 MG tablet Take 0.5-1 tablets (12.5-25 mg total) by mouth 3 (three) times daily as needed for dizziness. 30 tablet 0  . NITROSTAT 0.4 MG SL tablet PLACE 1 TABLET (0.4 MG TOTAL) UNDER THE TONGUE EVERY 5 (FIVE) MINUTES AS NEEDED FOR CHEST PAIN. 25 tablet 5  . pantoprazole (PROTONIX)  40 MG tablet TAKE 1 TABLET BY MOUTH  DAILY 90 tablet 1  . pramipexole (MIRAPEX) 1 MG tablet TAKE 1 TABLET BY MOUTH 3  TIMES DAILY 270 tablet 1  . traMADol (ULTRAM) 50 MG tablet Take 1 tablet (50 mg total) by mouth every 8 (eight) hours as needed. 60 tablet 1   No current facility-administered medications for this visit.     SURGICAL HISTORY:  Past Surgical History:  Procedure Laterality Date  . EYE SURGERY     right  . Inguinal herniorrhapy left  2002   x 2  . KNEE SURGERY     left  . Left hip replacement    . LYMPH NODE DISSECTION Right 03/22/2015   Procedure: LYMPH NODE DISSECTION;  Surgeon: Melrose Nakayama, MD;  Location: [facility];  Service: Thoracic;  Laterality: Right;  . PERCUTANEOUS CORONARY STENT INTERVENTION (PCI-S) N/A 03/08/2012   Procedure: PERCUTANEOUS CORONARY STENT INTERVENTION (PCI-S);  Surgeon: Burnell Blanks, MD;  Location[facility] C[facility];  Service: Cardiovascular;  Laterality: N/A;  . PROSTATECTOMY  2002   radical  . Right hip replacement  09/22/09  . ROTATOR CUFF REPAIR     left  . s/p ventral surgery  2009  . SEGMENTECOMY Right 03/22/2015   Procedure: RIGHT LOWER LOBE SUPERIOR SEGMENTECTOMY;  Surgeon: Melrose Nakayama, MD;  Location: [facility];  Service: Thoracic;  Laterality: Right;  [name] VIDEO ASSISTED THORACOSCOPY Right 03/22/2015   Procedure: RIGHT VIDEO ASSISTED THORACOSCOPY;  Surgeon: Melrose Nakayama, MD;  Location: [facility];  Service: Thoracic;  Laterality: Right;    REVIEW OF SYSTEMS:  A comprehensive review of systems was negative except for: Constitutional: positive for fatigue Musculoskeletal: positive for muscle weakness   PHYSICAL EXAMINATION: General appearance: alert, cooperative, fatigued and no distress Head: Normocephalic, without obvious abnormality, atraumatic Neck: no adenopathy, no JVD, supple, symmetrical, trachea midline and thyroid not enlarged, symmetric, no tenderness/mass/nodules Lymph nodes: Cervical, supraclavicular, and axillary  nodes normal. Resp: clear to auscultation bilaterally Back: symmetric, no curvature. ROM normal. No CVA tenderness. Cardio: regular rate and rhythm, S1, S2 normal, no murmur, click, rub or gallop GI: soft, non-tender; bowel sounds normal; no masses,  no organomegaly Extremities: extremities normal, atraumatic, no cyanosis or edema  ECOG PERFORMANCE STATUS: 1 - Symptomatic but completely ambulatory  There were no vitals taken for this visit.  LABORATORY DATA: Lab Results  Component Value Date   WBC 4.8 11/07/2016   HGB 9.6 (L) 11/07/2016   HCT 29.2 (L) 11/07/2016   MCV 85.9 11/07/2016   PLT 157 11/07/2016      Chemistry      Component Value Date/Time   NA 140 11/07/2016 1018   K 3.9 11/07/2016 1018   CL 107 04/06/2016 1156   CO2 24 11/07/2016 1018   BUN 24.2 11/07/2016 1018   CREATININE 1.9 (H) 11/07/2016 1018      Component Value Date/Time   CALCIUM 9.1 11/07/2016 1018   ALKPHOS 74 11/07/2016  1018   AST 12 11/07/2016 1018   ALT 7 11/07/2016 1018   BILITOT 0.77 11/07/2016 1018       RADIOGRAPHIC STUDIES: Ct Chest Wo Contrast  Result Date: 11/07/2016 CLINICAL DATA:  Non-small-cell lung cancer. Status post right lower lobectomy pro EXAM: CT CHEST WITHOUT CONTRAST TECHNIQUE: Multidetector CT imaging of the chest was performed following the standard protocol without IV contrast. COMPARISON:  05/11/2016 FINDINGS: Cardiovascular: The heart size is normal. No pericardial effusion. Coronary artery calcification is noted. Atherosclerotic calcification is noted in the wall of the thoracic aorta. Mediastinum/Nodes: No mediastinal lymphadenopathy. No evidence for gross hilar lymphadenopathy although assessment is limited by the lack of intravenous contrast on today's study. The esophagus has normal imaging features. There is no axillary lymphadenopathy. Lungs/Pleura: Changes of emphysema again noted postsurgical change posterior right hilum is stable with suture material and scarring  noted posterior right lung. No new or progressive pulmonary nodule or mass. Upper Abdomen: Tiny gallstones noted. Musculoskeletal: Degenerative changes noted right shoulder with intra-articular loose body. Bone windows reveal no worrisome lytic or sclerotic osseous lesions. IMPRESSION: 1. Stable exam without new or progressive findings. 2. Post treatment changes right hilum and posterior lung. 3.  Emphysema. (ICD10-J43.9) 4. Cholelithiasis. Electronically Signed   By: Misty Stanley M.D.   On: 11/07/2016 13:58    ASSESSMENT AND PLAN:  This is a very pleasant 76 years old white male with history of stage IA non-small cell lung cancer, adenocarcinoma status post right lower lobectomy with lymph node dissection in August 2016 and has been observation since that time. His recent CT scan of the chest showed no evidence for disease recurrence. I discussed the scan results with the patient and his wife. I recommended for him to continue on observation with repeat CT scan of the chest without contrast in 6 months. He will continue his routine follow-up visit with his nephrologist and urologist for the renal insufficiency and prostate cancer. He was advised to call immediately if he has any concerning symptoms in the interval. The patient voices understanding of current disease status and treatment options and is in agreement with the current care plan.  All questions were answered. The patient knows to call the clinic with any problems, questions or concerns. We can certainly see the patient much sooner if necessary. I spent 10 minutes counseling the patient face to face. The total time spent in the appointment was 15 minutes.  Disclaimer: This note was dictated with voice recognition software. Similar sounding words can inadvertently be transcribed and may not be corrected upon review.

## 2016-11-08 NOTE — Telephone Encounter (Signed)
Gave patient AVS and calender per 11/08/2016 los. [facility] to contact patient with CT schedule.

## 2016-11-09 ENCOUNTER — Emergency Department (HOSPITAL_COMMUNITY)
Admission: EM | Admit: 2016-11-09 | Discharge: 2016-11-10 | Disposition: A | Discharge status: Home / Self Care | Payer: Medicare Other | Attending: Emergency Medicine | Admitting: Emergency Medicine

## 2016-11-09 ENCOUNTER — Telehealth: Payer: Self-pay | Admitting: Internal Medicine

## 2016-11-09 ENCOUNTER — Emergency Department (HOSPITAL_COMMUNITY): Payer: Medicare Other

## 2016-11-09 ENCOUNTER — Encounter (HOSPITAL_COMMUNITY): Payer: Self-pay

## 2016-11-09 DIAGNOSIS — R930 Abnormal findings on diagnostic imaging of skull and head, not elsewhere classified: Secondary | ICD-10-CM | POA: Insufficient documentation

## 2016-11-09 DIAGNOSIS — Z7982 Long term (current) use of aspirin: Secondary | ICD-10-CM | POA: Insufficient documentation

## 2016-11-09 DIAGNOSIS — I129 Hypertensive chronic kidney disease with stage 1 through stage 4 chronic kidney disease, or unspecified chronic kidney disease: Secondary | ICD-10-CM | POA: Diagnosis not present

## 2016-11-09 DIAGNOSIS — E860 Dehydration: Secondary | ICD-10-CM | POA: Insufficient documentation

## 2016-11-09 DIAGNOSIS — Z8546 Personal history of malignant neoplasm of prostate: Secondary | ICD-10-CM | POA: Diagnosis not present

## 2016-11-09 DIAGNOSIS — N183 Chronic kidney disease, stage 3 (moderate): Secondary | ICD-10-CM | POA: Insufficient documentation

## 2016-11-09 DIAGNOSIS — Z87891 Personal history of nicotine dependence: Secondary | ICD-10-CM | POA: Diagnosis not present

## 2016-11-09 DIAGNOSIS — I2510 Atherosclerotic heart disease of native coronary artery without angina pectoris: Secondary | ICD-10-CM | POA: Insufficient documentation

## 2016-11-09 DIAGNOSIS — Z8673 Personal history of transient ischemic attack (TIA), and cerebral infarction without residual deficits: Secondary | ICD-10-CM | POA: Insufficient documentation

## 2016-11-09 DIAGNOSIS — C3490 Malignant neoplasm of unspecified part of unspecified bronchus or lung: Secondary | ICD-10-CM | POA: Diagnosis not present

## 2016-11-09 DIAGNOSIS — R531 Weakness: Secondary | ICD-10-CM

## 2016-11-09 DIAGNOSIS — I700 Atherosclerosis of aorta: Secondary | ICD-10-CM | POA: Diagnosis not present

## 2016-11-09 DIAGNOSIS — R404 Transient alteration of awareness: Secondary | ICD-10-CM | POA: Diagnosis not present

## 2016-11-09 DIAGNOSIS — R410 Disorientation, unspecified: Secondary | ICD-10-CM | POA: Diagnosis not present

## 2016-11-09 LAB — BASIC METABOLIC PANEL
Anion gap: 6 (ref 5–15)
BUN: 23 mg/dL — ABNORMAL HIGH (ref 6–20)
CALCIUM: 8.7 mg/dL — AB (ref 8.9–10.3)
CO2: 26 mmol/L (ref 22–32)
CREATININE: 2.23 mg/dL — AB (ref 0.61–1.24)
Chloride: 107 mmol/L (ref 101–111)
GFR calc non Af Amer: 27 mL/min — ABNORMAL LOW (ref >60)
GFR, EST AFRICAN AMERICAN: 31 mL/min — AB (ref >60)
Glucose, Bld: 129 mg/dL — ABNORMAL HIGH (ref 65–99)
Potassium: 4 mmol/L (ref 3.5–5.1)
SODIUM: 139 mmol/L (ref 135–145)

## 2016-11-09 LAB — CBC
HCT: 27.5 % — ABNORMAL LOW (ref 39.0–52.0)
Hemoglobin: 8.9 g/dL — ABNORMAL LOW (ref 13.0–17.0)
MCH: 28.5 pg (ref 26.0–34.0)
MCHC: 32.4 g/dL (ref 30.0–36.0)
MCV: 88.1 fL (ref 78.0–100.0)
Platelets: 134 10*3/uL — ABNORMAL LOW (ref 150–400)
RBC: 3.12 MIL/uL — AB (ref 4.22–5.81)
RDW: 14 % (ref 11.5–15.5)
WBC: 5.5 10*3/uL (ref 4.0–10.5)

## 2016-11-09 LAB — HEPATIC FUNCTION PANEL
ALT: 9 U/L — AB (ref 17–63)
AST: 15 U/L (ref 15–41)
Albumin: 3.3 g/dL — ABNORMAL LOW (ref 3.5–5.0)
Alkaline Phosphatase: 60 U/L (ref 38–126)
BILIRUBIN TOTAL: 0.5 mg/dL (ref 0.3–1.2)
TOTAL PROTEIN: 5.9 g/dL — AB (ref 6.5–8.1)

## 2016-11-09 LAB — URINALYSIS, ROUTINE W REFLEX MICROSCOPIC
BILIRUBIN URINE: NEGATIVE
GLUCOSE, UA: NEGATIVE mg/dL
HGB URINE DIPSTICK: NEGATIVE
Ketones, ur: NEGATIVE mg/dL
Leukocytes, UA: NEGATIVE
Nitrite: NEGATIVE
Protein, ur: NEGATIVE mg/dL
Specific Gravity, Urine: 1.005 (ref 1.005–1.030)
pH: 7 (ref 5.0–8.0)

## 2016-11-09 LAB — BLOOD GAS, VENOUS
ACID-BASE EXCESS: 1.9 mmol/L (ref 0.0–2.0)
BICARBONATE: 27.5 mmol/L (ref 20.0–28.0)
O2 SAT: 82.8 %
PATIENT TEMPERATURE: 97.8
PO2 VEN: 49.1 mmHg — AB (ref 32.0–45.0)
pCO2, Ven: 50.7 mmHg (ref 44.0–60.0)
pH, Ven: 7.351 (ref 7.250–7.430)

## 2016-11-09 LAB — I-STAT TROPONIN, ED: Troponin i, poc: 0 ng/mL (ref 0.00–0.08)

## 2016-11-09 LAB — TSH: TSH: 2.41 u[IU]/mL (ref 0.350–4.500)

## 2016-11-09 MED ORDER — SODIUM CHLORIDE 0.9 % IV BOLUS (SEPSIS)
1000.0000 mL | Freq: Once | INTRAVENOUS | Status: AC
Start: 2016-11-09 — End: 2016-11-10
  Administered 2016-11-09: 1000 mL via INTRAVENOUS

## 2016-11-09 NOTE — ED Notes (Signed)
Pt ambulated down the hall very well with-out help

## 2016-11-09 NOTE — ED Notes (Signed)
Bed: WA07 Expected date:  Expected time:  Means of arrival:  Comments: EMS 76 y/o generalized weakness

## 2016-11-09 NOTE — ED Provider Notes (Signed)
[facility] DEPT Provider Note   CSN: [id number] Arrival date & time: 11/09/16  1802     History   Chief Complaint Chief Complaint  Patient presents with  . Weakness    HPI Victor Davis is a 76 y.o. male.  HPI 76 year old male who presents with generalized weakness and fatigue. 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 States several weeks of generalized weakness and intermittent mechanical fall. His wife states that he may have had some intermittent episodes of memory relapse, for example forgetting streets and street names. States that today after taking medications, he became lethargic and weak, which is very different for him. States that after taking his medications usually he does become this way.  His wife states that he tried to pick her up from the hairdresser's, and went to a completely different store. Has had decreased appetite.  Denies any fever, chills, vomiting, diarrhea, abdominal pain, nausea or vomiting, melena or hematochezia, chest pain or difficulty breathing, cough. His speech has been much more slurred and given his lethargy today, but his wife states that this is an ongoing issue for him for several weeks. Denies any focal numbness or weakness, vision changes.   Past Medical History:  Diagnosis Date  . BRADYCARDIA 10/24/2010  . CALF PAIN, RIGHT 10/22/2009  . 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 CKD (chronic kidney disease), stage II   . CKD (chronic kidney disease), stage III 02/04/2016  . Coronary artery disease     95% followed by 80% mid LAD stenosis, circumflex 80% stenosis. There was a moderate size branching obtuse marginal with 90-95% stenosis in the inferior branch. The right coronary artery had proximal 30% stenosis. Posterior lateral was moderate-sized with 40%  stenosis. The EF was 65-70%. He had a DES and  x 2 to the LAD and DES x 2 to the circumflex 03/08/12.    [name] Dysuria 10/10/2010  . Emphysema 04/01/2012   By CT chest, august 2013  . ERECTILE DYSFUNCTION 04/05/2007  . FATIGUE 10/02/2007  . GERD (gastroesophageal reflux disease)   . GLUCOSE INTOLERANCE 07/15/2008  . HYPERLIPIDEMIA 07/30/2008  . HYPERSOMNIA 10/02/2007  . Impaired glucose tolerance 02/09/2011  . INSOMNIA-SLEEP DISORDER-UNSPEC 10/22/2009  . LOW BACK PAIN 04/05/2007  . OSTEOARTHRITIS 04/05/2007  . PEPTIC ULCER DISEASE 04/05/2007  . PERIPHERAL EDEMA 10/22/2009  . PERIPHERAL VASCULAR DISEASE 07/30/2008  . PROSTATE CANCER, HX OF 04/05/2007  . RESTLESS LEG SYNDROME 05/15/2007  . RHINITIS, ALLERGIC NOS 05/15/2007  . SHOULDER PAIN, LEFT 01/22/2008  . SINUSITIS- ACUTE-NOS 05/15/2007  . Unspecified essential hypertension 04/05/2007  . Unspecified visual loss 07/15/2008  . UTI 10/24/2010    Patient Active Problem List   Diagnosis Date Noted  . CKD (chronic kidney disease), stage III 02/04/2016  . Snoring 01/21/2016  . Mild cognitive impairment 10/08/2015  . Multiple bruises 10/08/2015  . Non-small cell carcinoma of right lung, stage 1 ([place]) 04/29/2015  . Lung nodule 03/22/2015  . Cholecystostomy drain infection ([place]) 01/25/2015  . Abdominal pain 01/18/2015  . Cholecystitis 01/18/2015  . Acute on chronic kidney failure ([place]) 01/18/2015  . Right upper quadrant pain   . Acute cholecystitis   . Renal cyst 12/31/2012  . Hearing loss 07/05/2012  . Lesion of nose 07/05/2012  . Anemia, unspecified 04/02/2012  . Pulmonary emphysema ([place]) 04/01/2012  . Hypertension 03/09/2012  . Pulmonary  nodule, right 02/08/2012  . Mitral regurgitation 02/06/2012  . CAD (coronary artery disease) 01/04/2012  . Unspecified disorder of liver 12/18/2011  . Hyperthyroidism 12/18/2011  . Nonspecific abnormal findings on radiological and examination of lung field 12/18/2011  . Impaired glucose tolerance 02/09/2011    . Preventative health care 02/09/2011  . BRADYCARDIA 10/24/2010  . INSOMNIA-SLEEP DISORDER-UNSPEC 10/22/2009  . PERIPHERAL EDEMA 10/22/2009  . HLD (hyperlipidemia) 07/30/2008  . Peripheral vascular disease ([place]) 07/30/2008  . 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 Unspecified visual loss 07/15/2008  . SHOULDER PAIN, LEFT 01/22/2008  . Hypersomnia 10/02/2007  . RESTLESS LEG SYNDROME 05/15/2007  . RHINITIS, ALLERGIC NOS 05/15/2007  . ERECTILE DYSFUNCTION 04/05/2007  . PEPTIC ULCER DISEASE 04/05/2007  . Osteoarthritis 04/05/2007  . LOW BACK PAIN 04/05/2007  . PROSTATE CANCER, HX OF 04/05/2007    Past Surgical History:  Procedure Laterality Date  . EYE SURGERY     right  . Inguinal herniorrhapy left  2002   x 2  . KNEE SURGERY     left  . Left hip replacement    . LYMPH NODE DISSECTION Right 03/22/2015   Procedure: LYMPH NODE DISSECTION;  Surgeon: Melrose Nakayama, MD;  Location: [facility];  Service: Thoracic;  Laterality: Right;  . PERCUTANEOUS CORONARY STENT INTERVENTION (PCI-S) N/A 03/08/2012   Procedure: PERCUTANEOUS CORONARY STENT INTERVENTION (PCI-S);  Surgeon: Burnell Blanks, MD;  Location: [facility] CATH LAB;  Service: Cardiovascular;  Laterality: N/A;  . PROSTATECTOMY  2002   radical  . Right hip replacement  09/22/09  . ROTATOR CUFF REPAIR     left  . s/p ventral surgery  2009  . SEGMENTECOMY Right 03/22/2015   Procedure: RIGHT LOWER LOBE SUPERIOR SEGMENTECTOMY;  Surgeon: Melrose Nakayama, MD;  Location: [facility];  Service: Thoracic;  Laterality: Right;  [name] VIDEO ASSISTED THORACOSCOPY Right 03/22/2015   Procedure: RIGHT VIDEO ASSISTED THORACOSCOPY;  Surgeon: Melrose Nakayama, MD;  Location: [facility];  Service: Thoracic;  Laterality: Right;       Home Medications    Prior to Admission medications   Medication Sig Start Date End Date Taking? Authorizing Provider  amLODipine (NORVASC) 10 MG tablet Take 1 tablet (10 mg total) by mouth daily. 02/04/16  Yes Biagio Borg,  MD  aspirin 81 MG EC tablet Take 81 mg by mouth daily.     Yes Historical Provider, MD  citalopram (CELEXA) 40 MG tablet Take 1 tablet (40 mg total) by mouth daily. 06/30/16  Yes Biagio Borg, MD  cloNIDine (CATAPRES) 0.1 MG tablet TAKE 1 TABLET BY MOUTH IN  THE MORNING THEN TAKE 2  TABLETS IN THE EVENING 06/19/16  Yes Biagio Borg, MD  clopidogrel (PLAVIX) 75 MG tablet TAKE 1 TABLET BY MOUTH  DAILY 06/26/16  Yes Biagio Borg, MD  donepezil (ARICEPT) 5 MG tablet Take 1 tablet (5 mg total) by mouth at bedtime. 11/12/15  Yes Biagio Borg, MD  gabapentin (NEURONTIN) 100 MG capsule TAKE 1 CAPSULE BY MOUTH TWO TIMES DAILY 06/26/16  Yes Biagio Borg, MD  isosorbide mononitrate (IMDUR) 30 MG 24 hr tablet TAKE 1 TABLET BY MOUTH  DAILY 06/26/16  Yes Biagio Borg, MD  lovastatin (MEVACOR) 40 MG tablet TAKE 1 TABLET BY MOUTH AT  BEDTIME 08/29/16  Yes Biagio Borg, MD  meclizine (ANTIVERT) 25 MG tablet Take 0.5-1 tablets (12.5-25 mg total) by mouth 3 (three) times daily as needed for dizziness. 09/09/16  Yes Owens Loffler, MD  pantoprazole ([place])  40 MG tablet TAKE 1 TABLET BY MOUTH  DAILY 06/26/16  Yes Biagio Borg, MD  pramipexole (MIRAPEX) 1 MG tablet TAKE 1 TABLET BY MOUTH 3  TIMES DAILY 10/17/16  Yes Biagio Borg, MD  NITROSTAT 0.4 MG SL tablet PLACE 1 TABLET (0.4 MG TOTAL) UNDER THE TONGUE EVERY 5 (FIVE) MINUTES AS NEEDED FOR CHEST PAIN. 09/01/15   Burnell Blanks, MD  traMADol (ULTRAM) 50 MG tablet Take 1 tablet (50 mg total) by mouth every 8 (eight) hours as needed. Patient taking differently: Take 50 mg by mouth every 8 (eight) hours as needed for severe pain.  04/06/16   Biagio Borg, MD    Family History Family History  Problem Relation Age of Onset  . Heart attack Father 2    Smoker  . Heart attack Brother 50  . Lung cancer Sister   . Colon cancer Neg Hx     Social History Social History  Substance Use Topics  . Smoking status: Former Smoker    Packs/day: 1.00    Years: 15.00     Types: Cigarettes    Quit date: 02/05/1977  . Smokeless tobacco: Never Used  . Alcohol use 0.5 oz/week    1 Standard drinks or equivalent per week     Comment: RARE     Allergies   Amitriptyline hcl; Diphenhydramine hcl; Simvastatin; and Tylenol [acetaminophen]   Review of Systems Review of Systems 10/14 systems reviewed and are negative other than those stated in the HPI   Physical Exam Updated Vital Signs BP (!) 142/71 (BP Location: Left Arm)   Pulse 88   Temp 98.3 F (36.8 C) (Oral)   Resp 18   SpO2 98%   Physical Exam Physical Exam  Nursing note and vitals reviewed. Constitutional: Listless, lethargic, non-toxic, and in no acute distress Head: Normocephalic and atraumatic.  Mouth/Throat: Oropharynx is clear and moist.  Neck: Normal range of motion. Neck supple.  Cardiovascular: Normal rate and regular rhythm.   Pulmonary/Chest: Effort normal and breath sounds normal.  Abdominal: Soft. There is no tenderness. There is no rebound and no guarding.  Musculoskeletal: Normal range of motion.  Neurological: Somnolent but arouses to voice, no facial droop, slurring of speech, moves all extremities symmetrically, pupils equal and reactive to light, extraocular movements intact, no pronator drift, globally weak in bilateral hand grip, and lower extremities. Sensation to light touch intact throughout. Skin: Skin is warm and dry.  Psychiatric: Cooperative   ED Treatments / Results  Labs (all labs ordered are listed, but only abnormal results are displayed) Labs Reviewed  BASIC METABOLIC PANEL - Abnormal; Notable for the following:       Result Value   Glucose, Bld 129 (*)    BUN 23 (*)    Creatinine, Ser 2.23 (*)    Calcium 8.7 (*)    GFR calc non Af Amer 27 (*)    GFR calc Af Amer 31 (*)    All other components within normal limits  CBC - Abnormal; Notable for the following:    RBC 3.12 (*)    Hemoglobin 8.9 (*)    HCT 27.5 (*)    Platelets 134 (*)    All other  components within normal limits  URINALYSIS, ROUTINE W REFLEX MICROSCOPIC - Abnormal; Notable for the following:    Color, Urine COLORLESS (*)    All other components within normal limits  HEPATIC FUNCTION PANEL - Abnormal; Notable for the following:    Total Protein  5.9 (*)    Albumin 3.3 (*)    ALT 9 (*)    Bilirubin, Direct <0.1 (*)    All other components within normal limits  BLOOD GAS, VENOUS - Abnormal; Notable for the following:    pO2, Ven 49.1 (*)    All other components within normal limits  URINE CULTURE  TSH  CBG MONITORING, ED  I-STAT TROPOININ, ED    EKG  EKG Interpretation  Date/Time:  Thursday November 09 2016 18:25:54 EDT Ventricular Rate:  57 PR Interval:    QRS Duration: 104 QT Interval:  448 QTC Calculation: 437 R Axis:   23 Text Interpretation:  Sinus rhythm Prolonged PR interval Minimal ST elevation, anterior leads Mild depression in III Confirmed by Savannha Welle MD, Gilma Bessette [phone]) on 11/09/2016 6:39:04 PM       Radiology Dg Chest 2 View  Result Date: 11/09/2016 CLINICAL DATA:  Weakness for 5 weeks worsened over last 2 days, history coronary artery disease, hypertension, GERD, emphysema, former smoker EXAM: CHEST  2 VIEW COMPARISON:  CT chest 11/07/2016 FINDINGS: Upper normal size of cardiac silhouette with note of coronary arterial stents. Atherosclerotic calcification aorta. Mediastinal contours and pulmonary vascularity normal. Post surgical changes RIGHT lower lobe. Lungs otherwise clear. No pleural effusion or pneumothorax. Bones demineralized. IMPRESSION: Post coronary stenting and RIGHT lower lobe resection. No acute abnormalities. Aortic atherosclerosis. Electronically Signed   By: Lavonia Myles Tavella M.D.   On: 11/09/2016 19:24   Ct Head Wo Contrast  Result Date: 11/09/2016 CLINICAL DATA:  Lethargy, weakness and confusion. History of stroke, non-small-cell lung cancer in remission. EXAM: CT HEAD WITHOUT CONTRAST TECHNIQUE: Contiguous axial images were obtained from  the base of the skull through the vertex without intravenous contrast. COMPARISON:  MRI of the head July 22, 2008 FINDINGS: BRAIN: No intraparenchymal hemorrhage, mass effect nor midline shift. Old bilateral occipital lobe infarcts. Old small pontine and multifocal LEFT cerebellar infarcts present previously. Old LEFT basal ganglia lacunar infarct. Patchy to confluent supratentorial white matter hypodensities. No acute large vascular territory infarcts. No abnormal extra-axial fluid collections. No hydrocephalus. VASCULAR: Severe calcific atherosclerosis of the carotid siphons and included vertebral artery's. SKULL: No skull fracture. No significant scalp soft tissue swelling. SINUSES/ORBITS: The mastoid air-cells and included paranasal sinuses are well-aerated.The included ocular globes and orbital contents are non-suspicious. Status post RIGHT ocular lens implant. OTHER: None. IMPRESSION: No acute intracranial process. Multiple old posterior circulation infarcts and LEFT basal ganglia lacunar infarct. Moderate chronic small vessel ischemic disease. Severe atherosclerosis. Electronically Signed   By: Elon Alas M.D.   On: 11/09/2016 19:33    Procedures Procedures (including critical care time)  Medications Ordered in ED Medications  sodium chloride 0.9 % bolus 1,000 mL (0 mLs Intravenous Stopped 11/10/16 0031)     Initial Impression / Assessment and Plan / ED Course  I have reviewed the triage vital signs and the nursing notes.  Pertinent labs & imaging results that were available during my care of the patient were reviewed by me and considered in my medical decision making (see chart for details).     76 year old male who presents with generalized weakness. On presentation is low energy and sleepy, but arouses to voice, oriented and answers questions appropriately. Globally weak but no focal neurological deficits. Afebrile with stable vital signs. No signs of infection on UA and cxr  visualized and unremarkable for pneumonia, edema or other acute Cardiopulmonary processes. CT head visualized and does not show acute intracranial processes that showed  stroke. No focal deficits here today to suggest a new CVA. Blood work with anemia, but close to baseline. No Gi bleeding history. With mild AKI, baseline of 1.9 and today 2.2. Given IVF, and has dramatic turn around. Is much more alert, interactive. Walking steadily in the hallways by himself. Feels back to baseline. Question if presentation may be dehydration mediated. Also he has these symptoms after taking all of his medications, and question potential medication mediated as well. No major sedating medications seen. Discussed potential observation admission, but he requests discharge home given that he back to normal. He will call PCP tomorrow to discuss medication management. Strict return and follow-up instructions reviewed. He and wife expressed understanding of all discharge instructions and felt comfortable with the plan of care.   Final Clinical Impressions(s) / ED Diagnoses   Final diagnoses:  Weakness  Generalized weakness  Dehydration    New Prescriptions New Prescriptions   No medications on file     Forde Dandy, MD 11/10/16 [phone]

## 2016-11-09 NOTE — ED Triage Notes (Signed)
Pt BIB [facility] from home c/o weakness x 5 weeks that has become worse over the last 2 days. Pt denies chest pain or SOB. Hx CVA, HTN, CAD and Emphysema.

## 2016-11-09 NOTE — Telephone Encounter (Signed)
Victor Davis - Client [facility]  Patient Name: Victor Davis  DOB: 13-Oct-1940    Initial Comment Caller states her husband has been falling a lot and having a hard time walking. He hit his head about week ago very hard. She states he is having a hard time remembering things.    Nurse Assessment  Nurse: Germain Osgood RN, Opal Sidles Date/Time Eilene Ghazi Time): 11/09/2016 5:09:49 PM  Confirm and document reason for call. If symptomatic, describe symptoms. ---Caller states her husband has been falling a lot and having a hard time walking. He hit his head about week ago very hard. She states he is having a hard time remembering things. Forgot where wife was at beauty shop. Takes Plavix  Does the patient have any new or worsening symptoms? ---Yes  Will a triage be completed? ---Yes  Related visit to physician within the last 2 weeks? ---No  Does the PT have any chronic conditions? (i.e. diabetes, asthma, etc.) ---Yes  List chronic conditions. ---Saw Oncology MD yesterday Hx lung cancer  Is this a behavioral health or substance abuse call? ---No     Guidelines    Guideline Title Affirmed Question Affirmed Notes  Head Injury [1] ACUTE NEURO SYMPTOM AND [2] present now (DEFINITION: difficult to awaken OR confused thinking and talking OR slurred speech OR weakness of arms OR unsteady walking)    Final Disposition User   Call EMS 911 Now Germain Osgood, RN, [facility] - ED   Disagree/Comply: Comply

## 2016-11-09 NOTE — Discharge Instructions (Signed)
Please drink plenty of fluids. Your work-up today is reassuring. You may be dehydrated, as you now feel normal after fluids.  Please follow-up with your primary care doctor regarding your medications.  Return without fail for worsening symptoms, including inability to walk, confusion, fever or any other symptoms concerning to you.

## 2016-11-10 LAB — CBG MONITORING, ED: GLUCOSE-CAPILLARY: 148 mg/dL — AB (ref 65–99)

## 2016-11-11 LAB — URINE CULTURE

## 2016-11-15 ENCOUNTER — Other Ambulatory Visit: Payer: Self-pay | Admitting: Internal Medicine

## 2016-11-17 ENCOUNTER — Encounter: Payer: Self-pay | Admitting: *Deleted

## 2016-12-01 ENCOUNTER — Other Ambulatory Visit: Payer: Self-pay | Admitting: Cardiovascular Disease

## 2016-12-01 DIAGNOSIS — I779 Disorder of arteries and arterioles, unspecified: Secondary | ICD-10-CM

## 2016-12-01 DIAGNOSIS — I739 Peripheral vascular disease, unspecified: Principal | ICD-10-CM

## 2016-12-06 ENCOUNTER — Ambulatory Visit (INDEPENDENT_AMBULATORY_CARE_PROVIDER_SITE_OTHER): Payer: Medicare Other | Admitting: Cardiovascular Disease

## 2016-12-06 ENCOUNTER — Encounter: Payer: Self-pay | Admitting: Cardiovascular Disease

## 2016-12-06 ENCOUNTER — Ambulatory Visit (HOSPITAL_COMMUNITY)
Admission: RE | Admit: 2016-12-06 | Discharge: 2016-12-06 | Disposition: A | Discharge status: Home / Self Care | Payer: Medicare Other | Source: Ambulatory Visit | Attending: Cardiovascular Disease | Admitting: Cardiovascular Disease

## 2016-12-06 VITALS — BP 132/64 | HR 82 | Ht 67.0 in | Wt 139.8 lb

## 2016-12-06 DIAGNOSIS — I739 Peripheral vascular disease, unspecified: Secondary | ICD-10-CM

## 2016-12-06 DIAGNOSIS — I10 Essential (primary) hypertension: Secondary | ICD-10-CM | POA: Diagnosis not present

## 2016-12-06 DIAGNOSIS — I2510 Atherosclerotic heart disease of native coronary artery without angina pectoris: Secondary | ICD-10-CM

## 2016-12-06 DIAGNOSIS — I779 Disorder of arteries and arterioles, unspecified: Secondary | ICD-10-CM | POA: Diagnosis present

## 2016-12-06 DIAGNOSIS — I6523 Occlusion and stenosis of bilateral carotid arteries: Secondary | ICD-10-CM | POA: Diagnosis not present

## 2016-12-06 DIAGNOSIS — E7800 Pure hypercholesterolemia, unspecified: Secondary | ICD-10-CM | POA: Diagnosis not present

## 2016-12-06 NOTE — Patient Instructions (Signed)
Your physician recommends that you continue on your current medications as directed. Please refer to the Current Medication list given to you today.  Your physician wants you to follow-up in: YEAR WITH DR MCALHANY  You will receive a reminder letter in the mail two months in advance. If you don't receive a letter, please call our office to schedule the follow-up appointment. 

## 2016-12-06 NOTE — Progress Notes (Signed)
Chief Complaint  Patient presents with  . Weight Loss     History of Present Illness: 76 yo male with history of CAD, CVA, HTN, HLD, PAD, prostate CA who is here today for cardiac follow up. He is known to have CAD. Cardiac cath July 2013 with severe LAD and Circumflex disease. PCI delayed because of renal insufficiency and while he was having a renal cyst workup. He was brought back on 03/08/12 and I placed 2 DES in the LAD and 2 DES in the Circumflex. He did well following the PCI. Admitted to [facility] August 2016 for resection of right lung adenocarcinoma. He is known to have moderate carotid artery disease, most recent dopplers on file are in April 2017 but repeat testing today is pending. Possible syncopal event October 2016. Cardiac event monitor showed rare PVCs but no other arrhythmias and no high grade AV block.   He is here today for follow up. The patient denies any chest pain, dyspnea, palpitations, lower extremity edema, orthopnea, PND, dizziness, near syncope or syncope. He c/o losing weight. He has lost 7 lbs in the last six months.   Primary Care Physician: Cathlean Cower, MD  Past Medical History:  Diagnosis Date  . BRADYCARDIA 10/24/2010  . CALF PAIN, RIGHT 10/22/2009  . 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 CKD (chronic kidney disease), stage II   . CKD (chronic kidney disease), stage III 02/04/2016  . Coronary artery disease     95% followed by 80% mid LAD stenosis, circumflex 80% stenosis. There was a moderate size branching obtuse marginal with 90-95% stenosis in the inferior branch. The right coronary artery had proximal 30% stenosis. Posterior lateral was moderate-sized with 40% stenosis. The EF was 65-70%. He had a DES and  x 2 to the LAD and DES x 2 to the circumflex 03/08/12.    [name] Dysuria 10/10/2010  . Emphysema 04/01/2012   By CT chest, august 2013  . ERECTILE DYSFUNCTION 04/05/2007  . FATIGUE 10/02/2007  . GERD (gastroesophageal reflux disease)   . GLUCOSE INTOLERANCE  07/15/2008  . HYPERLIPIDEMIA 07/30/2008  . HYPERSOMNIA 10/02/2007  . Impaired glucose tolerance 02/09/2011  . INSOMNIA-SLEEP DISORDER-UNSPEC 10/22/2009  . LOW BACK PAIN 04/05/2007  . OSTEOARTHRITIS 04/05/2007  . PEPTIC ULCER DISEASE 04/05/2007  . PERIPHERAL EDEMA 10/22/2009  . PERIPHERAL VASCULAR DISEASE 07/30/2008  . PROSTATE CANCER, HX OF 04/05/2007  . RESTLESS LEG SYNDROME 05/15/2007  . RHINITIS, ALLERGIC NOS 05/15/2007  . SHOULDER PAIN, LEFT 01/22/2008  . SINUSITIS- ACUTE-NOS 05/15/2007  . Unspecified essential hypertension 04/05/2007  . Unspecified visual loss 07/15/2008  . UTI 10/24/2010    Past Surgical History:  Procedure Laterality Date  . EYE SURGERY     right  . Inguinal herniorrhapy left  2002   x 2  . KNEE SURGERY     left  . Left hip replacement    . LYMPH NODE DISSECTION Right 03/22/2015   Procedure: LYMPH NODE DISSECTION;  Surgeon: Melrose Nakayama, MD;  Location: [place];  Service: Thoracic;  Laterality: Right;  . PERCUTANEOUS CORONARY STENT INTERVENTION (PCI-S) N/A 03/08/2012   Procedure: PERCUTANEOUS CORONARY STENT INTERVENTION (PCI-S);  Surgeon: Burnell Blanks, MD;  Location: [facility] CATH LAB;  Service: Cardiovascular;  Laterality: N/A;  . PROSTATECTOMY  2002   radical  . Right hip replacement  09/22/09  . ROTATOR CUFF REPAIR     left  . s/p ventral surgery  2009  . SEGMENTECOMY Right 03/22/2015   Procedure: RIGHT LOWER LOBE SUPERIOR SEGMENTECTOMY;  Surgeon: Melrose Nakayama, MD;  Location: [place];  Service: Thoracic;  Laterality: Right;  [name] VIDEO ASSISTED THORACOSCOPY Right 03/22/2015   Procedure: RIGHT VIDEO ASSISTED THORACOSCOPY;  Surgeon: Melrose Nakayama, MD;  Location: [place];  Service: Thoracic;  Laterality: Right;    Current Outpatient Prescriptions  Medication Sig Dispense Refill  . amLODipine (NORVASC) 10 MG tablet Take 1 tablet (10 mg total) by mouth daily. 90 tablet 3  . aspirin 81 MG EC tablet Take 81 mg by mouth daily.      . citalopram (CELEXA) 40  MG tablet Take 1 tablet (40 mg total) by mouth daily. 90 tablet 3  . cloNIDine (CATAPRES) 0.1 MG tablet TAKE 1 TABLET BY MOUTH IN  THE MORNING THEN TAKE 2  TABLETS IN THE EVENING 240 tablet 0  . clopidogrel (PLAVIX) 75 MG tablet TAKE 1 TABLET BY MOUTH  DAILY 90 tablet 1  . donepezil (ARICEPT) 5 MG tablet Take 1 tablet (5 mg total) by mouth at bedtime. 90 tablet 3  . gabapentin (NEURONTIN) 100 MG capsule TAKE 1 CAPSULE BY MOUTH TWO TIMES DAILY 180 capsule 1  . isosorbide mononitrate (IMDUR) 30 MG 24 hr tablet TAKE 1 TABLET BY MOUTH  DAILY 90 tablet 1  . lovastatin (MEVACOR) 40 MG tablet TAKE 1 TABLET BY MOUTH AT  BEDTIME 90 tablet 0  . meclizine (ANTIVERT) 25 MG tablet Take 0.5-1 tablets (12.5-25 mg total) by mouth 3 (three) times daily as needed for dizziness. 30 tablet 0  . NITROSTAT 0.4 MG SL tablet PLACE 1 TABLET (0.4 MG TOTAL) UNDER THE TONGUE EVERY 5 (FIVE) MINUTES AS NEEDED FOR CHEST PAIN. 25 tablet 5  . pantoprazole (PROTONIX) 40 MG tablet TAKE 1 TABLET BY MOUTH  DAILY 90 tablet 1  . pramipexole (MIRAPEX) 1 MG tablet TAKE 1 TABLET BY MOUTH 3  TIMES DAILY 270 tablet 1  . traMADol (ULTRAM) 50 MG tablet Take 1 tablet (50 mg total) by mouth every 8 (eight) hours as needed. (Patient taking differently: Take 50 mg by mouth every 8 (eight) hours as needed for severe pain. ) 60 tablet 1   No current facility-administered medications for this visit.     Allergies  Allergen Reactions  . Amitriptyline Hcl Other (See Comments)    REACTION: confusion  . Diphenhydramine Hcl Other (See Comments)    Keeps him awake  . Simvastatin Other (See Comments)    REACTION: leg pain  . Tylenol [Acetaminophen] Other (See Comments)    Dr advised pt not to take due to finding a spot on pt's kidney    Social History   Social History  . Marital status: Married    Spouse name: N/A  . Number of children: 2  . Years of education: N/A   Occupational History  . truck driver-retired    Social History Main  Topics  . Smoking status: Former Smoker    Packs/day: 1.00    Years: 15.00    Types: Cigarettes    Quit date: 02/05/1977  . Smokeless tobacco: Never Used  . Alcohol use 0.5 oz/week    1 Standard drinks or equivalent per week     Comment: RARE  . Drug use: No  . Sexual activity: Not Currently   Other Topics Concern  . Not on file   Social History Narrative  . No narrative on file    Family History  Problem Relation Age of Onset  . Heart attack Father 55    Smoker  .  Heart attack Brother 36  . Lung cancer Sister   . Colon cancer Neg Hx     Review of Systems:  As stated in the HPI and otherwise negative.   BP 132/64   Pulse 82   Ht '5\' 7"'$  (1.702 m)   Wt 139 lb 12.8 oz (63.4 kg)   SpO2 92%   BMI 21.90 kg/m   Physical Exam:  General: Well developed, well nourished, NAD  HEENT: OP clear, mucus membranes moist  SKIN: warm, dry. No rashes. Neuro: No focal deficits  Musculoskeletal: Muscle strength 5/5 all ext  Psychiatric: Mood and affect normal  Neck: No JVD, no carotid bruits, no thyromegaly, no lymphadenopathy.  Lungs:Clear bilaterally, no wheezes, rhonci, crackles Cardiovascular: Regular rate and rhythm. No murmurs, gallops or rubs. Abdomen:Soft. Bowel sounds present. Non-tender.  Extremities: No lower extremity edema. Pulses are 2 + in the bilateral DP/PT.  EKG:  EKG is not ordered today. The ekg ordered today demonstrates   Recent Labs: 11/09/2016: ALT 9; BUN 23; Creatinine, Ser 2.23; Hemoglobin 8.9; Platelets 134; Potassium 4.0; Sodium 139; TSH 2.410   Lipid Panel    Component Value Date/Time   CHOL 103 04/06/2016 1156   TRIG 66.0 04/06/2016 1156   HDL 36.50 (L) 04/06/2016 1156   CHOLHDL 3 04/06/2016 1156   VLDL 13.2 04/06/2016 1156   LDLCALC 53 04/06/2016 1156   LDLDIRECT 104.9 02/10/2011 1224     Wt Readings from Last 3 Encounters:  12/06/16 139 lb 12.8 oz (63.4 kg)  09/09/16 148 lb 4 oz (67.2 kg)  06/07/16 146 lb (66.2 kg)     Other studies  Reviewed: Additional studies/ records that were reviewed today include: . Review of the above records demonstrates:   Assessment and Plan:   1. CAD without angina: No chest pain suggestive of angina. Will continue ASA, Plavix and Imdur. No beta blocker with braydcardia.   2. HTN: BP is well controlled. No changes.    3. HLD: LDL at goal. Continue statin.    4. Carotid artery disease: Bilateral 40-59% stenosis by dopplers April 2017. Repeat testing today.   5. Weight loss: Unexplained, unintentional weight loss in pt with prior lung cancer and prostate cancer. I have asked him to discuss this with Dr. Jenny Reichmann.   Current medicines are reviewed at length with the patient today.  The patient does not have concerns regarding medicines.  The following changes have been made:  no change  Labs/ tests ordered today include:  No orders of the defined types were placed in this encounter.    Disposition:   FU with me in 12 months   Signed, Lauree Chandler, MD 12/06/2016 4:44 PM    [facility] [place], [place], [place]  [address] Phone: [phone]; Fax: [phone]

## 2016-12-11 ENCOUNTER — Telehealth: Payer: Self-pay | Admitting: Cardiovascular Disease

## 2016-12-11 NOTE — Telephone Encounter (Signed)
I spoke with pt's wife who was returning my message to call office.  I told her I had already spoken with pt and reviewed results with him.

## 2016-12-11 NOTE — Telephone Encounter (Addendum)
New message      Returning a call to the nurse to get carotid results

## 2016-12-22 DIAGNOSIS — H524 Presbyopia: Secondary | ICD-10-CM | POA: Diagnosis not present

## 2016-12-22 DIAGNOSIS — H2512 Age-related nuclear cataract, left eye: Secondary | ICD-10-CM | POA: Diagnosis not present

## 2016-12-27 ENCOUNTER — Other Ambulatory Visit: Payer: Self-pay | Admitting: Internal Medicine

## 2016-12-28 DIAGNOSIS — C61 Malignant neoplasm of prostate: Secondary | ICD-10-CM | POA: Diagnosis not present

## 2017-01-03 ENCOUNTER — Other Ambulatory Visit: Payer: Self-pay | Admitting: Internal Medicine

## 2017-01-04 ENCOUNTER — Emergency Department (HOSPITAL_COMMUNITY): Payer: Medicare Other

## 2017-01-04 ENCOUNTER — Encounter (HOSPITAL_COMMUNITY): Payer: Self-pay | Admitting: Emergency Medicine

## 2017-01-04 ENCOUNTER — Emergency Department (HOSPITAL_COMMUNITY)
Admission: EM | Admit: 2017-01-04 | Discharge: 2017-01-04 | Disposition: A | Discharge status: Home / Self Care | Payer: Medicare Other | Attending: Emergency Medicine | Admitting: Emergency Medicine

## 2017-01-04 DIAGNOSIS — Z87891 Personal history of nicotine dependence: Secondary | ICD-10-CM | POA: Insufficient documentation

## 2017-01-04 DIAGNOSIS — Z79899 Other long term (current) drug therapy: Secondary | ICD-10-CM | POA: Diagnosis not present

## 2017-01-04 DIAGNOSIS — I129 Hypertensive chronic kidney disease with stage 1 through stage 4 chronic kidney disease, or unspecified chronic kidney disease: Secondary | ICD-10-CM | POA: Insufficient documentation

## 2017-01-04 DIAGNOSIS — Z7982 Long term (current) use of aspirin: Secondary | ICD-10-CM | POA: Insufficient documentation

## 2017-01-04 DIAGNOSIS — Z8673 Personal history of transient ischemic attack (TIA), and cerebral infarction without residual deficits: Secondary | ICD-10-CM | POA: Diagnosis not present

## 2017-01-04 DIAGNOSIS — N183 Chronic kidney disease, stage 3 (moderate): Secondary | ICD-10-CM | POA: Diagnosis not present

## 2017-01-04 DIAGNOSIS — R42 Dizziness and giddiness: Secondary | ICD-10-CM | POA: Diagnosis not present

## 2017-01-04 DIAGNOSIS — R4701 Aphasia: Secondary | ICD-10-CM | POA: Diagnosis present

## 2017-01-04 DIAGNOSIS — R001 Bradycardia, unspecified: Secondary | ICD-10-CM

## 2017-01-04 DIAGNOSIS — I2510 Atherosclerotic heart disease of native coronary artery without angina pectoris: Secondary | ICD-10-CM | POA: Diagnosis not present

## 2017-01-04 LAB — TSH: TSH: 5.101 u[IU]/mL — AB (ref 0.350–4.500)

## 2017-01-04 LAB — CBC WITH DIFFERENTIAL/PLATELET
BASOS ABS: 0 10*3/uL (ref 0.0–0.1)
Basophils Relative: 0 %
Eosinophils Absolute: 0.2 10*3/uL (ref 0.0–0.7)
Eosinophils Relative: 3 %
HEMATOCRIT: 30.1 % — AB (ref 39.0–52.0)
HEMOGLOBIN: 9.6 g/dL — AB (ref 13.0–17.0)
LYMPHS PCT: 20 %
Lymphs Abs: 1.5 10*3/uL (ref 0.7–4.0)
MCH: 27.7 pg (ref 26.0–34.0)
MCHC: 31.9 g/dL (ref 30.0–36.0)
MCV: 87 fL (ref 78.0–100.0)
Monocytes Absolute: 0.7 10*3/uL (ref 0.1–1.0)
Monocytes Relative: 10 %
NEUTROS PCT: 67 %
Neutro Abs: 4.8 10*3/uL (ref 1.7–7.7)
PLATELETS: 172 10*3/uL (ref 150–400)
RBC: 3.46 MIL/uL — AB (ref 4.22–5.81)
RDW: 14.4 % (ref 11.5–15.5)
WBC: 7.3 10*3/uL (ref 4.0–10.5)

## 2017-01-04 LAB — BASIC METABOLIC PANEL
Anion gap: 6 (ref 5–15)
BUN: 28 mg/dL — ABNORMAL HIGH (ref 6–20)
CO2: 27 mmol/L (ref 22–32)
Calcium: 9.1 mg/dL (ref 8.9–10.3)
Chloride: 104 mmol/L (ref 101–111)
Creatinine, Ser: 1.99 mg/dL — ABNORMAL HIGH (ref 0.61–1.24)
GFR calc Af Amer: 36 mL/min — ABNORMAL LOW (ref >60)
GFR, EST NON AFRICAN AMERICAN: 31 mL/min — AB (ref >60)
Glucose, Bld: 104 mg/dL — ABNORMAL HIGH (ref 65–99)
POTASSIUM: 4.5 mmol/L (ref 3.5–5.1)
SODIUM: 137 mmol/L (ref 135–145)

## 2017-01-04 LAB — I-STAT CHEM 8, ED
BUN: 30 mg/dL — ABNORMAL HIGH (ref 6–20)
CREATININE: 2 mg/dL — AB (ref 0.61–1.24)
Calcium, Ion: 1.22 mmol/L (ref 1.15–1.40)
Chloride: 101 mmol/L (ref 101–111)
GLUCOSE: 99 mg/dL (ref 65–99)
HCT: 28 % — ABNORMAL LOW (ref 39.0–52.0)
HEMOGLOBIN: 9.5 g/dL — AB (ref 13.0–17.0)
POTASSIUM: 4.4 mmol/L (ref 3.5–5.1)
Sodium: 138 mmol/L (ref 135–145)
TCO2: 26 mmol/L (ref 0–100)

## 2017-01-04 LAB — MAGNESIUM: Magnesium: 2.3 mg/dL (ref 1.7–2.4)

## 2017-01-04 LAB — TROPONIN I: Troponin I: <0.03 ng/mL (ref <0.03)

## 2017-01-04 LAB — CBG MONITORING, ED: GLUCOSE-CAPILLARY: 110 mg/dL — AB (ref 65–99)

## 2017-01-04 MED ORDER — SODIUM CHLORIDE 0.9 % IV BOLUS (SEPSIS)
1000.0000 mL | Freq: Once | INTRAVENOUS | Status: AC
  Administered 2017-01-04: 1000 mL via INTRAVENOUS

## 2017-01-04 NOTE — Discharge Instructions (Signed)
STOP TAKING THE CLONIDINE. This could be causing your heart rate to be lower than normal. See Dr. Jenny Reichmann tomorrow for a change in Blood Pressure medicines

## 2017-01-04 NOTE — ED Notes (Signed)
ED Provider at bedside. 

## 2017-01-04 NOTE — ED Notes (Signed)
Called lab and verified that troponin can be added on to specimens down there.

## 2017-01-04 NOTE — ED Triage Notes (Signed)
Pt woke this morning at 10 am and states he was feeling well. Pt states at 11am he began having slurred speech. Pt drove to urology appointment today and states he had some confusion driving to appointment. Brought in by urology staff, staff reported patient had slurred speech and confusion upon arrival to their office. Pt arrived to triage c/o weakness and slurred speech but denies numbness, pt without facial droop, alert and oriented to self.

## 2017-01-04 NOTE — ED Provider Notes (Signed)
[facility] DEPT Provider Note   CSN: [id number] Arrival date & time: 01/04/17  1211     History   Chief Complaint Chief Complaint  Patient presents with  . Weakness  . Aphasia    HPI Victor Davis is a 76 y.o. male.  HPI  76 year old male sent from urology clinic with weakness and slurred speech. Patient tells me that shortly after waking up this morning while preparing to go to his urology appointment he started feeling dizzy for about one hour. He states it was not really a lightheaded sensation but more unsteadiness. This seemed to go away on its own. The patient denies ever having had a headache. No vomiting or diarrhea. His mouth has been very dry this morning which is why he states he's slurring his words. He and his wife confirmed this has happened multiple times before  Past Medical History:  Diagnosis Date  . BRADYCARDIA 10/24/2010  . CALF PAIN, RIGHT 10/22/2009  . 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 CKD (chronic kidney disease), stage II   . CKD (chronic kidney disease), stage III 02/04/2016  . Coronary artery disease     95% followed by 80% mid LAD stenosis, circumflex 80% stenosis. There was a moderate size branching obtuse marginal with 90-95% stenosis in the inferior branch. The right coronary artery had proximal 30% stenosis. Posterior lateral was moderate-sized with 40% stenosis. The EF was 65-70%. He had a DES and  x 2 to the LAD and DES x 2 to the circumflex 03/08/12.    [name] Dysuria 10/10/2010  . Emphysema 04/01/2012   By CT chest, august 2013  . ERECTILE DYSFUNCTION 04/05/2007  . FATIGUE 10/02/2007  . GERD (gastroesophageal reflux disease)   . GLUCOSE INTOLERANCE 07/15/2008  . HYPERLIPIDEMIA 07/30/2008  . HYPERSOMNIA 10/02/2007  . Impaired glucose tolerance 02/09/2011  . INSOMNIA-SLEEP DISORDER-UNSPEC 10/22/2009  . LOW BACK PAIN 04/05/2007  . OSTEOARTHRITIS 04/05/2007  . PEPTIC ULCER DISEASE 04/05/2007  . PERIPHERAL EDEMA 10/22/2009  . PERIPHERAL VASCULAR  DISEASE 07/30/2008  . PROSTATE CANCER, HX OF 04/05/2007  . RESTLESS LEG SYNDROME 05/15/2007  . RHINITIS, ALLERGIC NOS 05/15/2007  . SHOULDER PAIN, LEFT 01/22/2008  . SINUSITIS- ACUTE-NOS 05/15/2007  . Unspecified essential hypertension 04/05/2007  . Unspecified visual loss 07/15/2008  . UTI 10/24/2010    Patient Active Problem List   Diagnosis Date Noted  . CKD (chronic kidney disease), stage III 02/04/2016  . Snoring 01/21/2016  . Mild cognitive impairment 10/08/2015  . Multiple bruises 10/08/2015  . Non-small cell carcinoma of right lung, stage 1 ([place]) 04/29/2015  . Lung nodule 03/22/2015  . Cholecystostomy drain infection ([place]) 01/25/2015  . Abdominal pain 01/18/2015  . Cholecystitis 01/18/2015  . Acute on chronic kidney failure ([place]) 01/18/2015  . Right upper quadrant pain   . Acute cholecystitis   . Renal cyst 12/31/2012  . Hearing loss 07/05/2012  . Lesion of nose 07/05/2012  . Anemia, unspecified 04/02/2012  . Pulmonary emphysema ([place]) 04/01/2012  . Hypertension 03/09/2012  . Pulmonary nodule, right 02/08/2012  . Mitral regurgitation 02/06/2012  . CAD (coronary artery disease) 01/04/2012  . Unspecified disorder of liver 12/18/2011  . Hyperthyroidism 12/18/2011  . Nonspecific abnormal findings on radiological and examination of lung field 12/18/2011  . Impaired glucose tolerance 02/09/2011  . Preventative health care 02/09/2011  . BRADYCARDIA 10/24/2010  . INSOMNIA-SLEEP DISORDER-UNSPEC 10/22/2009  . PERIPHERAL EDEMA 10/22/2009  . HLD (hyperlipidemia) 07/30/2008  . Peripheral vascular disease ([place]) 07/30/2008  . 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 Unspecified  visual loss 07/15/2008  . SHOULDER PAIN, LEFT 01/22/2008  . Hypersomnia 10/02/2007  . RESTLESS LEG SYNDROME 05/15/2007  . RHINITIS, ALLERGIC NOS 05/15/2007  . ERECTILE DYSFUNCTION 04/05/2007  . PEPTIC ULCER DISEASE 04/05/2007  . Osteoarthritis 04/05/2007  . LOW BACK PAIN 04/05/2007  . PROSTATE CANCER, HX OF  04/05/2007    Past Surgical History:  Procedure Laterality Date  . EYE SURGERY     right  . Inguinal herniorrhapy left  2002   x 2  . KNEE SURGERY     left  . Left hip replacement    . LYMPH NODE DISSECTION Right 03/22/2015   Procedure: LYMPH NODE DISSECTION;  Surgeon: Melrose Nakayama, MD;  Location: [facility];  Service: Thoracic;  Laterality: Right;  . PERCUTANEOUS CORONARY STENT INTERVENTION (PCI-S) N/A 03/08/2012   Procedure: PERCUTANEOUS CORONARY STENT INTERVENTION (PCI-S);  Surgeon: Burnell Blanks, MD;  Location: [facility] CATH LAB;  Service: Cardiovascular;  Laterality: N/A;  . PROSTATECTOMY  2002   radical  . Right hip replacement  09/22/09  . ROTATOR CUFF REPAIR     left  . s/p ventral surgery  2009  . SEGMENTECOMY Right 03/22/2015   Procedure: RIGHT LOWER LOBE SUPERIOR SEGMENTECTOMY;  Surgeon: Melrose Nakayama, MD;  Location: [place];  Service: Thoracic;  Laterality: Right;  [name] VIDEO ASSISTED THORACOSCOPY Right 03/22/2015   Procedure: RIGHT VIDEO ASSISTED THORACOSCOPY;  Surgeon: Melrose Nakayama, MD;  Location: [facility];  Service: Thoracic;  Laterality: Right;       Home Medications    Prior to Admission medications   Medication Sig Start Date End Date Taking? Authorizing Provider  amLODipine (NORVASC) 10 MG tablet TAKE 1 TABLET BY MOUTH  DAILY 01/03/17  Yes Biagio Borg, MD  aspirin 81 MG EC tablet Take 81 mg by mouth daily.     Yes [provider]  citalopram (CELEXA) 40 MG tablet Take 1 tablet (40 mg total) by mouth daily. 06/30/16  Yes Biagio Borg, MD  clopidogrel (PLAVIX) 75 MG tablet TAKE 1 TABLET BY MOUTH  DAILY 01/03/17  Yes Biagio Borg, MD  donepezil (ARICEPT) 5 MG tablet Take 1 tablet (5 mg total) by mouth at bedtime. 11/12/15  Yes Biagio Borg, MD  gabapentin (NEURONTIN) 100 MG capsule TAKE 1 CAPSULE BY MOUTH TWO TIMES DAILY 01/03/17  Yes Biagio Borg, MD  isosorbide mononitrate (IMDUR) 30 MG 24 hr tablet TAKE 1 TABLET BY MOUTH  DAILY 01/03/17  Yes  Biagio Borg, MD  lovastatin (MEVACOR) 40 MG tablet TAKE 1 TABLET BY MOUTH AT  BEDTIME 01/03/17  Yes Biagio Borg, MD  meclizine (ANTIVERT) 25 MG tablet Take 0.5-1 tablets (12.5-25 mg total) by mouth 3 (three) times daily as needed for dizziness. 09/09/16  Yes Copland, Frederico Hamman, MD  NITROSTAT 0.4 MG SL tablet PLACE 1 TABLET (0.4 MG TOTAL) UNDER THE TONGUE EVERY 5 (FIVE) MINUTES AS NEEDED FOR CHEST PAIN. 09/01/15  Yes Burnell Blanks, MD  pantoprazole (PROTONIX) 40 MG tablet TAKE 1 TABLET BY MOUTH  DAILY 01/03/17  Yes Biagio Borg, MD  pramipexole (MIRAPEX) 1 MG tablet TAKE 1 TABLET BY MOUTH 3  TIMES DAILY 10/17/16  Yes Biagio Borg, MD  traMADol (ULTRAM) 50 MG tablet Take 1 tablet (50 mg total) by mouth every 8 (eight) hours as needed. Patient taking differently: Take 50 mg by mouth every 8 (eight) hours as needed for severe pain.  04/06/16  Yes Biagio Borg, MD  Family History Family History  Problem Relation Age of Onset  . Heart attack Father 25       Smoker  . Heart attack Brother 66  . Lung cancer Sister   . Colon cancer Neg Hx     Social History Social History  Substance Use Topics  . Smoking status: Former Smoker    Packs/day: 1.00    Years: 15.00    Types: Cigarettes    Quit date: 02/05/1977  . Smokeless tobacco: Never Used  . Alcohol use 0.5 oz/week    1 Standard drinks or equivalent per week     Comment: RARE     Allergies   Amitriptyline hcl; Diphenhydramine hcl; Simvastatin; and Tylenol [acetaminophen]   Review of Systems Review of Systems  Constitutional: Negative for fever.  Respiratory: Negative for shortness of breath.   Cardiovascular: Negative for chest pain.  Gastrointestinal: Negative for abdominal pain, diarrhea and vomiting.  Genitourinary: Negative for dysuria.  Neurological: Positive for dizziness.  All other systems reviewed and are negative.    Physical Exam Updated Vital Signs BP 130/63   Pulse (!) 57   Temp 97.5 F (36.4 C)  (Oral)   Resp 16   SpO2 98%   Physical Exam  Constitutional: He is oriented to person, place, and time. He appears well-developed and well-nourished.  HENT:  Head: Normocephalic and atraumatic.  Right Ear: External ear normal.  Left Ear: External ear normal.  Nose: Nose normal.  Mouth/Throat: Uvula is midline. Mucous membranes are dry. No oropharyngeal exudate.  Eyes: EOM are normal. Pupils are equal, round, and reactive to light. Right eye exhibits no discharge. Left eye exhibits no discharge.  Neck: Neck supple.  Cardiovascular: Regular rhythm and normal heart sounds.  Bradycardia present.   Pulmonary/Chest: Effort normal and breath sounds normal.  Abdominal: Soft. There is no tenderness.  Musculoskeletal: He exhibits no edema.  Neurological: He is alert and oriented to person, place, and time.  CN 3-12 grossly intact. 5/5 strength in all 4 extremities. Grossly normal sensation. Normal finger to nose.   Skin: Skin is warm and dry.  Psychiatric: His speech is slurred.  Nursing note and vitals reviewed.    ED Treatments / Results  Labs (all labs ordered are listed, but only abnormal results are displayed) Labs Reviewed  BASIC METABOLIC PANEL - Abnormal; Notable for the following:       Result Value   Glucose, Bld 104 (*)    BUN 28 (*)    Creatinine, Ser 1.99 (*)    GFR calc non Af Amer 31 (*)    GFR calc Af Amer 36 (*)    All other components within normal limits  CBC WITH DIFFERENTIAL/PLATELET - Abnormal; Notable for the following:    RBC 3.46 (*)    Hemoglobin 9.6 (*)    HCT 30.1 (*)    All other components within normal limits  TSH - Abnormal; Notable for the following:    TSH 5.101 (*)    All other components within normal limits  I-STAT CHEM 8, ED - Abnormal; Notable for the following:    BUN 30 (*)    Creatinine, Ser 2.00 (*)    Hemoglobin 9.5 (*)    HCT 28.0 (*)    All other components within normal limits  CBG MONITORING, ED - Abnormal; Notable for the  following:    Glucose-Capillary 110 (*)    All other components within normal limits  MAGNESIUM  TROPONIN I    EKG  EKG Interpretation  Date/Time:  Thursday Jan 04 2017 12:23:21 EDT Ventricular Rate:  42 PR Interval:    QRS Duration: 94 QT Interval:  518 QTC Calculation: 433 R Axis:   38 Text Interpretation:  Sinus bradycardia Anteroseptal infarct, old rate is slower compared to Mar 2018 Confirmed by Sherwood Gambler [phone]) on 01/04/2017 12:50:32 PM       Radiology Ct Head Wo Contrast  Result Date: 01/04/2017 CLINICAL DATA:  On and off dizziness for weeks.  11/09/2016 EXAM: CT HEAD WITHOUT CONTRAST TECHNIQUE: Contiguous axial images were obtained from the base of the skull through the vertex without intravenous contrast. COMPARISON:  11/09/2016 FINDINGS: Brain: No evidence of acute infarction, hemorrhage, hydrocephalus, extra-axial collection or mass lesion/mass effect. Extensive patchy remote infarct in the left cerebellum. Remote bilateral occipital infarcts, more extensive on the left. Microvascular ischemic gliosis in the cerebral white matter that is moderate and generalized. Remote lacunar infarct in the pons. Cerebral volume loss that is generalized. Vascular: Atherosclerotic calcification. Skull: No acute or aggressive finding Sinuses/Orbits: Right cataract resection.  No acute finding. IMPRESSION: 1. No acute finding. 2. Multiple remote posterior circulation infarcts as described. 3. Moderate chronic small vessel ischemia. Electronically Signed   By: Monte Fantasia M.D.   On: 01/04/2017 14:53    Procedures Procedures (including critical care time)  Medications Ordered in ED Medications  sodium chloride 0.9 % bolus 1,000 mL (0 mLs Intravenous Stopped 01/04/17 1553)     Initial Impression / Assessment and Plan / ED Course  I have reviewed the triage vital signs and the nursing notes.  Pertinent labs & imaging results that were available during my care of the patient were  reviewed by me and considered in my medical decision making (see chart for details).     On arrival patient's HR was low, in 40s. Sinus without AV block or disassociation. However he is not symptomatic on arrival to ED either. No hypotension. His slurred speech is a frequent issue from dry mouth. Highly doubt CVA. No other stroke like factors with oral and IV fluids speech has improved. On and off dizziness for weeks, thus CT obtained, no obvious masses or clear causes. Could be from HR. D/w Dr. Marlou Porch, we discussed meds and will hold clonidine, f/u closly with PCP and cardiologist (Dr. Angelena Form). Strict return precautions. HR now more normal, in 50s and 60s, BP stable.  Final Clinical Impressions(s) / ED Diagnoses   Final diagnoses:  Sinus bradycardia    New Prescriptions Discharge Medication List as of 01/04/2017  4:00 PM       Sherwood Gambler, MD 01/04/17 2105

## 2017-01-04 NOTE — ED Notes (Signed)
Patient ambulated to CT

## 2017-02-06 ENCOUNTER — Other Ambulatory Visit: Payer: Self-pay

## 2017-02-06 MED ORDER — PANTOPRAZOLE SODIUM 40 MG PO TBEC: 40.0000 mg | DELAYED_RELEASE_TABLET | Freq: Every day | ORAL | 0 refills | Status: DC

## 2017-02-06 MED ORDER — PRAMIPEXOLE DIHYDROCHLORIDE 1 MG PO TABS: 1.0000 mg | ORAL_TABLET | Freq: Three times a day (TID) | ORAL | 0 refills | Status: DC

## 2017-02-06 MED ORDER — AMLODIPINE BESYLATE 10 MG PO TABS: 10.0000 mg | ORAL_TABLET | Freq: Every day | ORAL | 0 refills | Status: DC

## 2017-02-08 ENCOUNTER — Other Ambulatory Visit: Payer: Self-pay

## 2017-02-08 MED ORDER — ISOSORBIDE MONONITRATE ER 30 MG PO TB24: 30.0000 mg | ORAL_TABLET | Freq: Every day | ORAL | 0 refills | Status: DC

## 2017-02-26 DIAGNOSIS — M25511 Pain in right shoulder: Secondary | ICD-10-CM | POA: Diagnosis not present

## 2017-02-26 DIAGNOSIS — S40011A Contusion of right shoulder, initial encounter: Secondary | ICD-10-CM | POA: Diagnosis not present

## 2017-02-27 ENCOUNTER — Telehealth: Payer: Self-pay

## 2017-02-27 DIAGNOSIS — S40011D Contusion of right shoulder, subsequent encounter: Secondary | ICD-10-CM | POA: Diagnosis not present

## 2017-02-27 DIAGNOSIS — M25511 Pain in right shoulder: Secondary | ICD-10-CM | POA: Diagnosis not present

## 2017-02-27 NOTE — Telephone Encounter (Signed)
Spoke with patients wife and she is aware of his new appt due to dr Julien Nordmann out of office  Victor Davis

## 2017-03-09 DIAGNOSIS — C61 Malignant neoplasm of prostate: Secondary | ICD-10-CM | POA: Diagnosis not present

## 2017-03-19 ENCOUNTER — Other Ambulatory Visit: Payer: Self-pay | Admitting: Internal Medicine

## 2017-03-26 ENCOUNTER — Other Ambulatory Visit: Payer: Self-pay | Admitting: Internal Medicine

## 2017-04-10 NOTE — Progress Notes (Signed)
Pre visit review using our clinic review tool, if applicable. No additional management support is needed unless otherwise documented below in the visit note. 

## 2017-04-10 NOTE — Progress Notes (Addendum)
Subjective:   Victor Davis is a 76 y.o. male who presents for an Initial Medicare Annual Wellness Visit. Wife c/o patient falling asleep frequently during the day. A pattern of patient taking his medication and becoming sleepy has been noticed. Review of Systems No ROS.  Medicare Wellness Visit. Additional risk factors are reflected in the social history.  Cardiac Risk Factors include: advanced age (>70men, >63 women);dyslipidemia;hypertension;male gender Sleep patterns: has frequent nighttime awakenings, gets up 2-3 times nightly to void and sleeps 4-5 hours nightly.  Patient reports insomnia issues, discussed recommended sleep tips and stress reduction tips, education was attached to patient's AVS.  Home Safety/Smoke Alarms: Feels safe in home. Smoke alarms in place.  Living environment; residence and Firearm Safety: 1-story house/ trailer, equipment: Radio producer, Type: [place], Walkers, Type: Conservation officer, nature and [employer], Type: Tub Surveyor, quantity, no firearms. Seat Belt Safety/Bike Helmet: Wears seat belt.      Objective:    Today's Vitals   04/11/17 0838 04/11/17 0915  BP: (!) 116/48 (!) 104/44  Pulse: 70 63  Resp: 17   Temp: (!) 97.5 F (36.4 C)   SpO2: 95% 98%  Weight: 143 lb (64.9 kg)   Height: 5\' 7"  (1.702 m)   PainSc: 3     Body mass index is 22.4 kg/m.  Current Medications (verified) Outpatient Encounter Prescriptions as of 04/11/2017  Medication Sig  . amLODipine (NORVASC) 10 MG tablet Take 1 tablet (10 mg total) by mouth daily.  [name] aspirin 81 MG EC tablet Take 81 mg by mouth daily.    . citalopram (CELEXA) 40 MG tablet Take 1 tablet (40 mg total) by mouth daily.  . cloNIDine (CATAPRES) 0.1 MG tablet TAKE 1 TABLET BY MOUTH IN  THE MORNING THEN TAKE 2  TABLETS IN THE EVENING  . clopidogrel (PLAVIX) 75 MG tablet TAKE 1 TABLET BY MOUTH  DAILY  . donepezil (ARICEPT) 5 MG tablet Take 1 tablet (5 mg total) by mouth at bedtime.  . gabapentin (NEURONTIN) 100 MG  capsule TAKE 1 CAPSULE BY MOUTH TWO TIMES DAILY  . isosorbide mononitrate (IMDUR) 30 MG 24 hr tablet Take 1 tablet (30 mg total) by mouth daily.  [name] lovastatin (MEVACOR) 40 MG tablet TAKE 1 TABLET BY MOUTH AT  BEDTIME  . meclizine (ANTIVERT) 25 MG tablet Take 0.5-1 tablets (12.5-25 mg total) by mouth 3 (three) times daily as needed for dizziness.  [name] NITROSTAT 0.4 MG SL tablet PLACE 1 TABLET (0.4 MG TOTAL) UNDER THE TONGUE EVERY 5 (FIVE) MINUTES AS NEEDED FOR CHEST PAIN.  [name] pantoprazole (PROTONIX) 40 MG tablet Take 1 tablet (40 mg total) by mouth daily.  . pramipexole (MIRAPEX) 1 MG tablet Take 1 tablet (1 mg total) by mouth 3 (three) times daily.  . traMADol (ULTRAM) 50 MG tablet Take 1 tablet (50 mg total) by mouth every 8 (eight) hours as needed. (Patient taking differently: Take 50 mg by mouth every 8 (eight) hours as needed for severe pain. )   No facility-administered encounter medications on file as of 04/11/2017.     Allergies (verified) Amitriptyline hcl; Diphenhydramine hcl; Simvastatin; and Tylenol [acetaminophen]   History: Past Medical History:  Diagnosis Date  . BRADYCARDIA 10/24/2010  . CALF PAIN, RIGHT 10/22/2009  . 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 CKD (chronic kidney disease), stage II   . CKD (chronic kidney disease), stage III 02/04/2016  . Coronary artery disease     95% followed by 80% mid LAD stenosis, circumflex 80% stenosis. There  was a moderate size branching obtuse marginal with 90-95% stenosis in the inferior branch. The right coronary artery had proximal 30% stenosis. Posterior lateral was moderate-sized with 40% stenosis. The EF was 65-70%. He had a DES and  x 2 to the LAD and DES x 2 to the circumflex 03/08/12.    [name] Dysuria 10/10/2010  . Emphysema 04/01/2012   By CT chest, august 2013  . ERECTILE DYSFUNCTION 04/05/2007  . FATIGUE 10/02/2007  . GERD (gastroesophageal reflux disease)   . GLUCOSE INTOLERANCE 07/15/2008  . HYPERLIPIDEMIA 07/30/2008  . HYPERSOMNIA  10/02/2007  . Impaired glucose tolerance 02/09/2011  . INSOMNIA-SLEEP DISORDER-UNSPEC 10/22/2009  . LOW BACK PAIN 04/05/2007  . OSTEOARTHRITIS 04/05/2007  . PEPTIC ULCER DISEASE 04/05/2007  . PERIPHERAL EDEMA 10/22/2009  . PERIPHERAL VASCULAR DISEASE 07/30/2008  . PROSTATE CANCER, HX OF 04/05/2007  . RESTLESS LEG SYNDROME 05/15/2007  . RHINITIS, ALLERGIC NOS 05/15/2007  . SHOULDER PAIN, LEFT 01/22/2008  . SINUSITIS- ACUTE-NOS 05/15/2007  . Unspecified essential hypertension 04/05/2007  . Unspecified visual loss 07/15/2008  . UTI 10/24/2010   Past Surgical History:  Procedure Laterality Date  . EYE SURGERY     right  . Inguinal herniorrhapy left  2002   x 2  . KNEE SURGERY     left  . Left hip replacement    . LYMPH NODE DISSECTION Right 03/22/2015   Procedure: LYMPH NODE DISSECTION;  Surgeon: Melrose Nakayama, MD;  Location: [facility];  Service: Thoracic;  Laterality: Right;  . PERCUTANEOUS CORONARY STENT INTERVENTION (PCI-S) N/A 03/08/2012   Procedure: PERCUTANEOUS CORONARY STENT INTERVENTION (PCI-S);  Surgeon: Burnell Blanks, MD;  Location[facility] CATH LAB;  Service: Cardiovascular;  Laterality: N/A;  . PROSTATECTOMY  2002   radical  . Right hip replacement  09/22/09  . ROTATOR CUFF REPAIR     left  . s/p ventral surgery  2009  . SEGMENTECOMY Right 03/22/2015   Procedure: RIGHT LOWER LOBE SUPERIOR SEGMENTECTOMY;  Surgeon: Melrose Nakayama, MD;  Location: [facility];  Service: Thoracic;  Laterality: Right;  [name] VIDEO ASSISTED THORACOSCOPY Right 03/22/2015   Procedure: RIGHT VIDEO ASSISTED THORACOSCOPY;  Surgeon: Melrose Nakayama, MD;  Location: [place];  Service: Thoracic;  Laterality: Right;   Family History  Problem Relation Age of Onset  . Heart attack Father 83       Smoker  . Heart attack Brother 16  . Lung cancer Sister   . Colon cancer Neg Hx    Social History   Occupational History  . truck driver-retired    Social History Main Topics  . Smoking status: Former Smoker     Packs/day: 1.00    Years: 15.00    Types: Cigarettes    Quit date: 02/05/1977  . Smokeless tobacco: Never Used  . Alcohol use 0.5 oz/week    1 Standard drinks or equivalent per week     Comment: RARE  . Drug use: No  . Sexual activity: Not Currently   Tobacco Counseling Counseling given: Not Answered   Activities of Daily Living In your present state of health, do you have any difficulty performing the following activities: 04/11/2017  Hearing? N  Vision? N  Difficulty concentrating or making decisions? Y  Walking or climbing stairs? N  Dressing or bathing? N  Doing errands, shopping? N  Preparing Food and eating ? N  Using the Toilet? N  In the past six months, have you accidently leaked urine? Y  Comment Leaks followed by Dr. Tresa Moore  Do you have problems with loss of bowel control? N  Managing your Medications? N  Managing your Finances? Y  Housekeeping or managing your Housekeeping? N  Some recent data might be hidden    Immunizations and Health Maintenance Immunization History  Administered Date(s) Administered  . Influenza Split 08/14/2011  . Influenza Whole 08/11/2009  . Influenza, High Dose Seasonal PF 04/22/2015, 04/24/2016  . Influenza,inj,Quad PF,6+ Mos 04/08/2013, 04/21/2014  . Pneumococcal Conjugate-13 10/15/2013  . Pneumococcal Polysaccharide-23 07/15/2008  . Td 08/14/2004, 10/20/2014   Health Maintenance Due  Topic Date Due  . INFLUENZA VACCINE  03/14/2017    Patient Care Team: Biagio Borg, MD as PCP - General Angelena Form Annita Brod, MD as Consulting Physician (Cardiology)  Indicate any recent Medical Services you may have received from other than [facility] providers in the past year (date may be approximate).    Assessment:   This is a routine wellness examination for Ola.Physical assessment deferred to PCP.   Hearing/Vision screen Hearing Screening Comments: Able to hear conversational tones w/o difficulty. No issues reported.  Passed whisper  test Vision Screening Comments: appointment yearly, Dr. Celene Squibb  Dietary issues and exercise activities discussed: Current Exercise Habits: The patient does not participate in regular exercise at present (patient does yard work), Exercise limited by: None identified  Diet (meal preparation, eat out, water intake, caffeinated beverages, dairy products, fruits and vegetables): in general, an "unhealthy" diet, reports poor appetite and generally eating unhealthy.  Reviewed heart healthy, discussed supplementing and provided coupons, encouraged patient to increase daily water intake and to eat frequently. Relevant patient education assigned to patient using Emmi., Diet education was attached to patient's AVS.   Goals    . <enter goal here>    . Be as healthy and as independent as possible      Depression Screen PHQ 2/9 Scores 04/11/2017 10/08/2015 10/15/2013  PHQ - 2 Score 2 0 1  PHQ- 9 Score 11 - -    Fall Risk Fall Risk  04/11/2017 10/08/2015 10/15/2013  Falls in the past year? Yes Yes Yes  Number falls in past yr: 2 or more 1 1  Injury with Fall? - No No  Comment - - tripped in the kitchen over a box  Risk for fall due to : Impaired mobility;Impaired balance/gait - -  Follow up Falls prevention discussed;Education provided - -    Cognitive Function: MMSE - Mini Mental State Exam 04/11/2017  Orientation to time 0  Orientation to Place 5  Registration 3  Attention/ Calculation 4  Recall 2  Language- name 2 objects 2  Language- repeat 1  Language- follow 3 step command 3  Language- read & follow direction 1  Write a sentence 1  Copy design 1  Total score 23        Screening Tests Health Maintenance  Topic Date Due  . INFLUENZA VACCINE  03/14/2017  . COLONOSCOPY  05/28/2018  . TETANUS/TDAP  10/19/2024  . PNA vac Low Risk Adult  Completed        Plan:     MD informed about patient frequently falling asleep and concerns about medication.   Start doing brain stimulating  activities (puzzles, reading, adult coloring books, staying active) to keep memory sharp.   Start to eat heart healthy diet (full of fruits, vegetables, whole grains, lean protein, water--limit salt, fat, and sugar intake) and increase physical activity as tolerated.  I have personally reviewed and noted the following in the patient's chart:   .  Medical and social history . Use of alcohol, tobacco or illicit drugs  . Current medications and supplements . Functional ability and status . Nutritional status . Physical activity . Advanced directives . List of other physicians . Vitals . Screenings to include cognitive, depression, and falls . Referrals and appointments  In addition, I have reviewed and discussed with patient certain preventive protocols, quality metrics, and best practice recommendations. A written personalized care plan for preventive services as well as general preventive health recommendations were provided to patient.     Michiel Cowboy, RN   04/11/2017   Medical screening examination/treatment/procedure(s) were performed by non-physician practitioner and as supervising physician I was immediately available for consultation/collaboration. I agree with above. Cathlean Cower, MD

## 2017-04-11 ENCOUNTER — Ambulatory Visit (INDEPENDENT_AMBULATORY_CARE_PROVIDER_SITE_OTHER): Payer: Medicare Other | Admitting: Internal Medicine

## 2017-04-11 ENCOUNTER — Other Ambulatory Visit (INDEPENDENT_AMBULATORY_CARE_PROVIDER_SITE_OTHER): Payer: Medicare Other

## 2017-04-11 ENCOUNTER — Encounter: Payer: Self-pay | Admitting: Internal Medicine

## 2017-04-11 VITALS — BP 104/44 | HR 63 | Temp 97.5°F | Resp 17 | Ht 67.0 in | Wt 143.0 lb

## 2017-04-11 DIAGNOSIS — Z0000 Encounter for general adult medical examination without abnormal findings: Secondary | ICD-10-CM

## 2017-04-11 DIAGNOSIS — R7302 Impaired glucose tolerance (oral): Secondary | ICD-10-CM | POA: Diagnosis not present

## 2017-04-11 DIAGNOSIS — E538 Deficiency of other specified B group vitamins: Secondary | ICD-10-CM

## 2017-04-11 DIAGNOSIS — N183 Chronic kidney disease, stage 3 unspecified: Secondary | ICD-10-CM

## 2017-04-11 DIAGNOSIS — Z0001 Encounter for general adult medical examination with abnormal findings: Secondary | ICD-10-CM

## 2017-04-11 DIAGNOSIS — G4710 Hypersomnia, unspecified: Secondary | ICD-10-CM

## 2017-04-11 DIAGNOSIS — Z23 Encounter for immunization: Secondary | ICD-10-CM | POA: Diagnosis not present

## 2017-04-11 DIAGNOSIS — E559 Vitamin D deficiency, unspecified: Secondary | ICD-10-CM | POA: Diagnosis not present

## 2017-04-11 DIAGNOSIS — D649 Anemia, unspecified: Secondary | ICD-10-CM

## 2017-04-11 DIAGNOSIS — I10 Essential (primary) hypertension: Secondary | ICD-10-CM

## 2017-04-11 LAB — LIPID PANEL
CHOLESTEROL: 117 mg/dL (ref 0–200)
HDL: 42.7 mg/dL (ref >39.00)
LDL Cholesterol: 61 mg/dL (ref 0–99)
NonHDL: 74.56
Total CHOL/HDL Ratio: 3
Triglycerides: 67 mg/dL (ref 0.0–149.0)
VLDL: 13.4 mg/dL (ref 0.0–40.0)

## 2017-04-11 LAB — VITAMIN B12: Vitamin B-12: 121 pg/mL — ABNORMAL LOW (ref 211–911)

## 2017-04-11 LAB — TSH: TSH: 1.75 u[IU]/mL (ref 0.35–4.50)

## 2017-04-11 LAB — CBC WITH DIFFERENTIAL/PLATELET
BASOS ABS: 0 10*3/uL (ref 0.0–0.1)
BASOS PCT: 0.3 % (ref 0.0–3.0)
EOS ABS: 0.1 10*3/uL (ref 0.0–0.7)
Eosinophils Relative: 1.8 % (ref 0.0–5.0)
HCT: 29.3 % — ABNORMAL LOW (ref 39.0–52.0)
Hemoglobin: 9.5 g/dL — ABNORMAL LOW (ref 13.0–17.0)
LYMPHS PCT: 19.1 % (ref 12.0–46.0)
Lymphs Abs: 1.3 10*3/uL (ref 0.7–4.0)
MCHC: 32.2 g/dL (ref 30.0–36.0)
MCV: 86.2 fl (ref 78.0–100.0)
Monocytes Absolute: 0.8 10*3/uL (ref 0.1–1.0)
Monocytes Relative: 11.8 % (ref 3.0–12.0)
NEUTROS ABS: 4.7 10*3/uL (ref 1.4–7.7)
NEUTROS PCT: 67 % (ref 43.0–77.0)
PLATELETS: 145 10*3/uL — AB (ref 150.0–400.0)
RBC: 3.4 Mil/uL — AB (ref 4.22–5.81)
RDW: 14.8 % (ref 11.5–15.5)
WBC: 7 10*3/uL (ref 4.0–10.5)

## 2017-04-11 LAB — URINALYSIS, ROUTINE W REFLEX MICROSCOPIC
Bilirubin Urine: NEGATIVE
Ketones, ur: NEGATIVE
LEUKOCYTES UA: NEGATIVE
Nitrite: NEGATIVE
PH: 6 (ref 5.0–8.0)
Specific Gravity, Urine: 1.025 (ref 1.000–1.030)
TOTAL PROTEIN, URINE-UPE24: 100 — AB
UROBILINOGEN UA: 0.2 (ref 0.0–1.0)
Urine Glucose: NEGATIVE
WBC, UA: NONE SEEN (ref 0–?)

## 2017-04-11 LAB — BASIC METABOLIC PANEL
BUN: 19 mg/dL (ref 6–23)
CHLORIDE: 106 meq/L (ref 96–112)
CO2: 29 meq/L (ref 19–32)
Calcium: 9 mg/dL (ref 8.4–10.5)
Creatinine, Ser: 1.57 mg/dL — ABNORMAL HIGH (ref 0.40–1.50)
GFR: 45.69 mL/min — ABNORMAL LOW (ref >60.00)
Glucose, Bld: 112 mg/dL — ABNORMAL HIGH (ref 70–99)
Potassium: 3.3 mEq/L — ABNORMAL LOW (ref 3.5–5.1)
SODIUM: 140 meq/L (ref 135–145)

## 2017-04-11 LAB — HEPATIC FUNCTION PANEL
ALT: 12 U/L (ref 0–53)
AST: 18 U/L (ref 0–37)
Albumin: 3.6 g/dL (ref 3.5–5.2)
Alkaline Phosphatase: 67 U/L (ref 39–117)
Bilirubin, Direct: 0.2 mg/dL (ref 0.0–0.3)
TOTAL PROTEIN: 6.2 g/dL (ref 6.0–8.3)
Total Bilirubin: 0.8 mg/dL (ref 0.2–1.2)

## 2017-04-11 LAB — IBC PANEL
Iron: 55 ug/dL (ref 42–165)
Saturation Ratios: 13.5 % — ABNORMAL LOW (ref 20.0–50.0)
Transferrin: 290 mg/dL (ref 212.0–360.0)

## 2017-04-11 LAB — AMMONIA: Ammonia: 19 umol/L (ref 11–35)

## 2017-04-11 LAB — VITAMIN D 25 HYDROXY (VIT D DEFICIENCY, FRACTURES): VITD: 35.29 ng/mL (ref 30.00–100.00)

## 2017-04-11 LAB — HEMOGLOBIN A1C: HEMOGLOBIN A1C: 5.6 % (ref 4.6–6.5)

## 2017-04-11 LAB — FERRITIN: Ferritin: 10.2 ng/mL — ABNORMAL LOW (ref 22.0–322.0)

## 2017-04-11 LAB — PSA: PSA: 27.49 ng/mL — ABNORMAL HIGH (ref 0.10–4.00)

## 2017-04-11 MED ORDER — CLONIDINE HCL 0.1 MG PO TABS: ORAL_TABLET | ORAL | 3 refills | Status: DC

## 2017-04-11 NOTE — Assessment & Plan Note (Addendum)
Suspect due to lack of sleep recently in a frail person without reserve of stamina; encouraged good sleep hygeine and will check ammonia but no hx of liver dz, and doubt OSA  In addition to the time spent performing CPE, I spent an additional 25 minutes face to face,in which greater than 50% of this time was spent in counseling and coordination of care for patient's acute illness as documented. Including the differential dx, tx, further evaluation and other management of muliple issues including the somnolence, HTN with lower blood pressure, CKD, anemia, b12 deficiency and hyperglycemia

## 2017-04-11 NOTE — Assessment & Plan Note (Signed)

## 2017-04-11 NOTE — Progress Notes (Signed)
Subjective:    Patient ID: Victor Davis, male    DOB: Jan 17, 1941, 76 y.o.   MRN: [id number]  HPI    Here for wellness and f/u; hx mostly per wife as pt is somnolent today; has been up 2 nights in a row for the most part attending to wife recent admit; in fact the night before last he was up to 2AM, then last evening just did not sleep well due to stress.  Very sleepy today, BP on the lower side, but has been taking clonidine high dose for some time without significant dry mouth or sleepiness.  O/w Overall doing ok;  Pt denies Chest pain, worsening SOB, DOE, wheezing, orthopnea, PND, worsening LE edema, palpitations, dizziness or syncope.  Pt denies neurological change such as new headache, facial or extremity weakness.  Pt denies polydipsia, polyuria, or low sugar symptoms. Pt states overall good compliance with treatment and medications, good tolerability, and has been trying to follow appropriate diet.  Pt denies worsening depressive symptoms, suicidal ideation or panic, though has more stress with wife. No fever, night sweats, wt loss, loss of appetite, or other constitutional symptoms.  Pt states good ability with ADL's, has low fall risk, home safety reviewed and adequate, no other significant changes in hearing or vision, and not active with exercise.  Has hx of CKD and anemia, but possibly some malnutrition as well, and hx of b12 defificency.  Due for flu shot Wt Readings from Last 3 Encounters:  04/11/17 143 lb (64.9 kg)  12/06/16 139 lb 12.8 oz (63.4 kg)  09/09/16 148 lb 4 oz (67.2 kg)   BP Readings from Last 3 Encounters:  04/11/17 (!) 104/44  01/04/17 130/63  12/06/16 132/64  somnolent after being up the last 2 nights due to wife illness and admit to hospital with transfusion.  Deos seem some sleepy recently on higher dose clonidine and BP lower today.   Past Medical History:  Diagnosis Date  . BRADYCARDIA 10/24/2010  . CALF PAIN, RIGHT 10/22/2009  . CEREBROVASCULAR ACCIDENT  07/23/2008  . CKD (chronic kidney disease), stage II   . CKD (chronic kidney disease), stage III 02/04/2016  . Coronary artery disease     95% followed by 80% mid LAD stenosis, circumflex 80% stenosis. There was a moderate size branching obtuse marginal with 90-95% stenosis in the inferior branch. The right coronary artery had proximal 30% stenosis. Posterior lateral was moderate-sized with 40% stenosis. The EF was 65-70%. He had a DES and  x 2 to the LAD and DES x 2 to the circumflex 03/08/12.    [name] Dysuria 10/10/2010  . Emphysema 04/01/2012   By CT chest, august 2013  . ERECTILE DYSFUNCTION 04/05/2007  . FATIGUE 10/02/2007  . GERD (gastroesophageal reflux disease)   . GLUCOSE INTOLERANCE 07/15/2008  . HYPERLIPIDEMIA 07/30/2008  . HYPERSOMNIA 10/02/2007  . Impaired glucose tolerance 02/09/2011  . INSOMNIA-SLEEP DISORDER-UNSPEC 10/22/2009  . LOW BACK PAIN 04/05/2007  . OSTEOARTHRITIS 04/05/2007  . PEPTIC ULCER DISEASE 04/05/2007  . PERIPHERAL EDEMA 10/22/2009  . PERIPHERAL VASCULAR DISEASE 07/30/2008  . PROSTATE CANCER, HX OF 04/05/2007  . RESTLESS LEG SYNDROME 05/15/2007  . RHINITIS, ALLERGIC NOS 05/15/2007  . SHOULDER PAIN, LEFT 01/22/2008  . SINUSITIS- ACUTE-NOS 05/15/2007  . Unspecified essential hypertension 04/05/2007  . Unspecified visual loss 07/15/2008  . UTI 10/24/2010   Past Surgical History:  Procedure Laterality Date  . EYE SURGERY     right  . Inguinal herniorrhapy left  2002  x 2  . KNEE SURGERY     left  . Left hip replacement    . LYMPH NODE DISSECTION Right 03/22/2015   Procedure: LYMPH NODE DISSECTION;  Surgeon: Melrose Nakayama, MD;  Location: [facility];  Service: Thoracic;  Laterality: Right;  . PERCUTANEOUS CORONARY STENT INTERVENTION (PCI-S) N/A 03/08/2012   Procedure: PERCUTANEOUS CORONARY STENT INTERVENTION (PCI-S);  Surgeon: Burnell Blanks, MD;  [facility] C[facility] LAB;  Service: Cardiovascular;  Laterality: N/A;  . PROSTATECTOMY  2002   radical  . Right hip  replacement  09/22/09  . ROTATOR CUFF REPAIR     left  . s/p ventral surgery  2009  . SEGMENTECOMY Right 03/22/2015   Procedure: RIGHT LOWER LOBE SUPERIOR SEGMENTECTOMY;  Surgeon: Melrose Nakayama, MD;  Location: [place];  Service: Thoracic;  Laterality: Right;  [name] VIDEO ASSISTED THORACOSCOPY Right 03/22/2015   Procedure: RIGHT VIDEO ASSISTED THORACOSCOPY;  Surgeon: Melrose Nakayama, MD;  Location: [place];  Service: Thoracic;  Laterality: Right;    reports that he quit smoking about 40 years ago. His smoking use included Cigarettes. He has a 15.00 pack-year smoking history. He has never used smokeless tobacco. He reports that he drinks about 0.5 oz of alcohol per week . He reports that he does not use drugs. family history includes Heart attack (age of onset: 78) in his father; Heart attack (age of onset: 36) in his brother; Lung cancer in his sister. Allergies  Allergen Reactions  . Amitriptyline Hcl Other (See Comments)    REACTION: confusion  . Diphenhydramine Hcl Other (See Comments)    Keeps him awake  . Simvastatin Other (See Comments)    REACTION: leg pain  . Tylenol [Acetaminophen] Other (See Comments)    Dr advised pt not to take due to finding a spot on pt's kidney   Current Outpatient Prescriptions on File Prior to Visit  Medication Sig Dispense Refill  . amLODipine (NORVASC) 10 MG tablet Take 1 tablet (10 mg total) by mouth daily. 90 tablet 0  . aspirin 81 MG EC tablet Take 81 mg by mouth daily.      . citalopram (CELEXA) 40 MG tablet Take 1 tablet (40 mg total) by mouth daily. 90 tablet 3  . clopidogrel (PLAVIX) 75 MG tablet TAKE 1 TABLET BY MOUTH  DAILY 90 tablet 0  . donepezil (ARICEPT) 5 MG tablet Take 1 tablet (5 mg total) by mouth at bedtime. 90 tablet 3  . gabapentin (NEURONTIN) 100 MG capsule TAKE 1 CAPSULE BY MOUTH TWO TIMES DAILY 180 capsule 0  . isosorbide mononitrate (IMDUR) 30 MG 24 hr tablet Take 1 tablet (30 mg total) by mouth daily. 90 tablet 0  . lovastatin  (MEVACOR) 40 MG tablet TAKE 1 TABLET BY MOUTH AT  BEDTIME 90 tablet 0  . meclizine (ANTIVERT) 25 MG tablet Take 0.5-1 tablets (12.5-25 mg total) by mouth 3 (three) times daily as needed for dizziness. 30 tablet 0  . NITROSTAT 0.4 MG SL tablet PLACE 1 TABLET (0.4 MG TOTAL) UNDER THE TONGUE EVERY 5 (FIVE) MINUTES AS NEEDED FOR CHEST PAIN. 25 tablet 5  . pantoprazole (PROTONIX) 40 MG tablet Take 1 tablet (40 mg total) by mouth daily. 90 tablet 0  . pramipexole (MIRAPEX) 1 MG tablet Take 1 tablet (1 mg total) by mouth 3 (three) times daily. 270 tablet 0  . traMADol (ULTRAM) 50 MG tablet Take 1 tablet (50 mg total) by mouth every 8 (eight) hours as needed. (Patient taking differently:  Take 50 mg by mouth every 8 (eight) hours as needed for severe pain. ) 60 tablet 1   No current facility-administered medications on file prior to visit.    Review of Systems Constitutional: Negative for other unusual diaphoresis, sweats, appetite or weight changes HENT: Negative for other worsening hearing loss, ear pain, facial swelling, mouth sores or neck stiffness.   Eyes: Negative for other worsening pain, redness or other visual disturbance.  Respiratory: Negative for other stridor or swelling Cardiovascular: Negative for other palpitations or other chest pain  Gastrointestinal: Negative for worsening diarrhea or loose stools, blood in stool, distention or other pain Genitourinary: Negative for hematuria, flank pain or other change in urine volume.  Musculoskeletal: Negative for myalgias or other joint swelling.  Skin: Negative for other color change, or other wound or worsening drainage.  Neurological: Negative for other syncope or numbness. Hematological: Negative for other adenopathy or swelling Psychiatric/Behavioral: Negative for hallucinations, other worsening agitation, SI, self-injury, or new decreased concentration All other system neg per pt    Objective:   Physical Exam BP (!) 104/44 (BP Location:  Left Arm, Cuff Size: Normal)   Pulse 63   Temp (!) 97.5 F (36.4 C)   Resp 17   Ht 5\' 7"  (1.702 m)   Wt 143 lb (64.9 kg)   SpO2 98%   BMI 22.40 kg/m  VS noted, somnolent, trying to fall asleep in chair, wife holds his arm to keep from falling, but o/w easy to arouse and can ambulate without assist Constitutional: Pt is oriented to person, place, and time. Appears well-developed and well-nourished, in no significant distress and comfortable Head: Normocephalic and atraumatic  Eyes: Conjunctivae and EOM are normal. Pupils are equal, round, and reactive to light Right Ear: External ear normal without discharge Left Ear: External ear normal without discharge Nose: Nose without discharge or deformity Mouth/Throat: Oropharynx is without other ulcerations and moist  Neck: Normal range of motion. Neck supple. No JVD present. No tracheal deviation present or significant neck LA or mass Cardiovascular: Normal rate, regular rhythm, normal heart sounds and intact distal pulses.   Pulmonary/Chest: WOB normal and breath sounds without rales or wheezing  Abdominal: Soft. Bowel sounds are normal. NT. No HSM  Musculoskeletal: Normal range of motion. Exhibits no edema Lymphadenopathy: Has no other cervical adenopathy.  Neurological: Pt is alert and oriented to person, place, and time. Pt has normal reflexes. No cranial nerve deficit. Motor grossly intact, Gait intact Skin: Skin is warm and dry. No rash noted or new ulcerations Psychiatric: unable due to somnolence, but does smile during exam at one point, not agitated Lab Results  Component Value Date   WBC 7.3 01/04/2017   HGB 9.5 (L) 01/04/2017   HCT 28.0 (L) 01/04/2017   PLT 172 01/04/2017   GLUCOSE 99 01/04/2017   CHOL 103 04/06/2016   TRIG 66.0 04/06/2016   HDL 36.50 (L) 04/06/2016   LDLDIRECT 104.9 02/10/2011   LDLCALC 53 04/06/2016   ALT 9 (L) 11/09/2016   AST 15 11/09/2016   NA 138 01/04/2017   K 4.4 01/04/2017   CL 101 01/04/2017    CREATININE 2.00 (H) 01/04/2017   BUN 30 (H) 01/04/2017   CO2 27 01/04/2017   TSH 5.101 (H) 01/04/2017   PSA 6.39 (H) 10/20/2014   INR 1.03 03/19/2015   HGBA1C 5.5 04/06/2016   MICROALBUR 3.6 (H) 02/01/2010      Assessment & Plan:

## 2017-04-11 NOTE — Patient Instructions (Addendum)
Ok to decrease the clonidine to 1 in the AM and 1 in the PM  Please continue all other medications as before, and refills have been done if requested.  Please have the pharmacy call with any other refills you may need.  Please continue your efforts at being more active, low cholesterol diet, and weight control.  You are otherwise up to date with prevention measures today.  Please keep your appointments with your specialists as you may have planned  Please go to the LAB in the Basement (turn left off the elevator) for the tests to be done today  You will be contacted by phone if any changes need to be made immediately.  Otherwise, you will receive a letter about your results with an explanation, but please check with MyChart first.  Please remember to sign up for MyChart if you have not done so, as this will be important to you in the future with finding out test results, communicating by private email, and scheduling acute appointments online when needed.   Please return in 1 month, or sooner if needed   Start doing brain stimulating activities (puzzles, reading, adult coloring books, staying active) to keep memory sharp.   Start to eat heart healthy diet (full of fruits, vegetables, whole grains, lean protein, water--limit salt, fat, and sugar intake) and increase physical activity as tolerated.  Insomnia Insomnia is a sleep disorder that makes it difficult to fall asleep or to stay asleep. Insomnia can cause tiredness (fatigue), low energy, difficulty concentrating, mood swings, and poor performance at work or school. There are three different ways to classify insomnia:  Difficulty falling asleep.  Difficulty staying asleep.  Waking up too early in the morning.  Any type of insomnia can be long-term (chronic) or short-term (acute). Both are common. Short-term insomnia usually lasts for three months or less. Chronic insomnia occurs at least three times a week for longer than  three months. What are the causes? Insomnia may be caused by another condition, situation, or substance, such as:  Anxiety.  Certain medicines.  Gastroesophageal reflux disease (GERD) or other gastrointestinal conditions.  Asthma or other breathing conditions.  Restle      ss legs syndrome, sleep apnea, or other sleep disorders.  Chronic pain.  Menopause. This may include hot flashes.  Stroke.  Abuse of alcohol, tobacco, or illegal drugs.  Depression.  Caffeine.  Neurological disorders, such as Alzheimer disease.  An overactive thyroid (hyperthyroidism).  The cause of insomnia may not be known. What increases the risk? Risk factors for insomnia include:  Gender. Women are more commonly affected than men.  Age. Insomnia is more common as you get older.  Stress. This may involve your professional or personal life.  Income. Insomnia is more common in people with lower income.  Lack of exercise.  Irregular work schedule or night shifts.  Traveling between different time zones.  What are the signs or symptoms? If you have insomnia, trouble falling asleep or trouble staying asleep is the main symptom. This may lead to other symptoms, such as:  Feeling fatigued.  Feeling nervous about going to sleep.  Not feeling rested in the morning.  Having trouble concentrating.  Feeling irritable, anxious, or depressed.  How is this treated? Treatment for insomnia depends on the cause. If your insomnia is caused by an underlying condition, treatment will focus on addressing the condition. Treatment may also include:  Medicines to help you sleep.  Counseling or therapy.  Lifestyle adjustments.  Follow  these instructions at home:  Take medicines only as directed by your health care provider.  Keep regular sleeping and waking hours. Avoid naps.  Keep a sleep diary to help you and your health care provider figure out what could be causing your insomnia.  Include: ? When you sleep. ? When you wake up during the night. ? How well you sleep. ? How rested you feel the next day. ? Any side effects of medicines you are taking. ? What you eat and drink.  Make your bedroom a comfortable place where it is easy to fall asleep: ? Put up shades or special blackout curtains to block light from outside. ? Use a white noise machine to block noise. ? Keep the temperature cool.  Exercise regularly as directed by your health care provider. Avoid exercising right before bedtime.  Use relaxation techniques to manage stress. Ask your health care provider to suggest some techniques that may work well for you. These may include: ? Breathing exercises. ? Routines to release muscle tension. ? Visualizing peaceful scenes.  Cut back on alcohol, caffeinated beverages, and cigarettes, especially close to bedtime. These can disrupt your sleep.  Do not overeat or eat spicy foods right before bedtime. This can lead to digestive discomfort that can make it hard for you to sleep.  Limit screen use before bedtime. This includes: ? Watching TV. ? Using your smartphone, tablet, and computer.  Stick to a routine. This can help you fall asleep faster. Try to do a quiet activity, brush your teeth, and go to bed at the same time each night.  Get out of bed if you are still awake after 15 minutes of trying to sleep. Keep the lights down, but try reading or doing a quiet activity. When you feel sleepy, go back to bed.  Make sure that you drive carefully. Avoid driving if you feel very sleepy.  Keep all follow-up appointments as directed by your health care provider. This is important. Contact a health care provider if:  You are tired throughout the day or have trouble in your daily routine due to sleepiness.  You continue to have sleep problems or your sleep problems get worse. Get help right away if:  You have serious thoughts about hurting yourself or someone  else. This information is not intended to replace advice given to you by your health care provider. Make sure you discuss any questions you have with your health care provider. Document Released: 07/28/2000 Document Revised: 12/31/2015 Document Reviewed: 05/01/2014 Elsevier Interactive Patient Education  2018 [place] on a Budget There are many ways to save money at the grocery store and continue to eat healthy. You can be successful if you plan your meals according to your budget, purchase according to your budget and grocery list, and prepare food yourself. How can I buy more food on a limited budget? Plan  Plan meals and snacks according to a grocery list and budget you create.  Look for recipes where you can cook once and make enough food for two meals.  Include meals that will "stretch" more expensive foods such as stews, casseroles, and stir-fry dishes.  Make a grocery list and make sure to bring it with you to the store. If you have a smart phone, you could use your phone to create your shopping list. Purchase  When grocery shopping, buy only the items on your grocery list and go only to the areas of the store that have the  items on your list. Prepare  Some meal items can be prepared in advance. Pre-cook on days when you have extra time.  Make extra food (such as by doubling recipes) and freeze the extras in meal-sized containers or in individual portions for fast meals and snacks.  Use leftovers in your meal plan for the week.  Try some meatless meals or try "no cook" meals like salads.  When you come home from the grocery store, wash and prepare your fruits and vegetables so they are ready to use and eat. This will help reduce food waste. How can I buy more food on a limited budget? Try these tips the next time you go shopping:  [place] store brands or generic brands.  Use coupons only for foods and brands you normally buy. Avoid buying items you  wouldn't normally buy simply because they are on sale.  Check online and in newspapers for weekly deals.  Buy healthy items from the bulk bins when available, such as herbs, spices, flours, pastas, nuts, and dried fruit.  Buy fruits and vegetables that are in season. Prices are usually lower on in-season produce.  Compare and contrast different items. You can do this by looking at the unit price on the price tag. Use it to compare different brands and sizes to find out which item is the best deal.  Choose naturally low-cost healthy items, such as carrots, potatoes, apples, bananas, and oranges. Dried or canned beans are a low-cost protein source.  Buy in bulk and freeze extra food. Items you can buy in bulk include meats, fish, poultry, frozen fruits, and frozen vegetables.  Limit the purchase of prepared or "ready-to-eat" foods, such as pre-cut fruits and vegetables and pre-made salads.  If possible, shop around to discover which grocery store offers the best prices. Some stores charge much more than other stores for the same items.  Do not shop when you are hungry. If you shop while hungry, It may be hard to stick to your list and budget.  Stick to your list and resist impulse buys. Treat your list as your official plan for the week.  Buy a variety of vegetables and fruit by purchasing fresh, frozen, and canned items.  Look beyond eye level. Foods at eye level (adult or child eye level) are more expensive. Look at the top and bottom shelves for deals.  Be efficient with your time when shopping. The more time you spend at the store, the more money you are likely to spend.  Consider other retailers such as dollar stores, larger [employer], local fruit and vegetable stands, and farmers markets.  What are some tips for less expensive food substitutions? When choosing more expensive foods like meats and dairy, try these tips to save money:  Choose cheaper cuts of meat, such as  bone-in chicken thighs and drumsticks instead skinless and boneless chicken. When you are ready to prepare the chicken, you can remove the skin yourself to make it healthier.  Choose lean meats like chicken or [place]. When choosing ground beef, make sure it is lean ground beef (92% lean, 8% fat). If you do buy a fattier ground beef, drain the fat before eating.  Buy dried beans and peas, such as lentils, split peas, or kidney beans.  For seafood, choose canned tuna, salmon, or sardines.  Eggs are a low-cost source of protein.  Buy the larger tubs of yogurt instead of individual-sized containers.  Choose water instead of sodas and other sweetened beverages.  Skip buying chips, cookies, and other "junk food". These items are usually expensive, high in calories, and low in nutritional value.  How can I prepare the foods I buy in the healthiest way? Practice these tips for cooking foods in the healthiest way to reduce excess fat and calorie intake:  Steam, saute, grill, or bake foods instead of frying them.  Make sure half your plate is filled with fruits or vegetables. Choose from fresh, frozen, or canned fruits and vegetables. If eating canned, remember to rinse them before eating. This will remove any excess salt added for packaging.  Trim all fat from meat before cooking. Remove the skin from chicken or [place].  Spoon off fat from meat dishes once they have been chilled in the refrigerator and the fat has hardened on the top.  Use skim milk, low-fat milk, or evaporated skim milk when making cream sauces, soups, or puddings.  Substitute low-fat yogurt, sour cream, or cottage cheese for sour cream and mayonnaise in dips and dressings.  Try lemon juice, herbs, or spices to season food instead of salt, butter, or margarine.  This information is not intended to replace advice given to you by your health care provider. Make sure you discuss any questions you have with your health care  provider. Document Released: 04/03/2014 Document Revised: 02/18/2016 Document Reviewed: 03/03/2014 Elsevier Interactive Patient Education  [employer].  It is important to avoid accidents which may result in broken bones.  Here are a few ideas on how to make your home safer so you will be less likely to trip or fall.  1. Use nonskid mats or non slip strips in your shower or tub, on your bathroom floor and around sinks.  If you know that you have spilled water, wipe it up! 2. In the bathroom, it is important to have properly installed grab bars on the walls or on the edge of the tub.  Towel racks are NOT strong enough for you to hold onto or to pull on for support. 3. Stairs and hallways should have enough light.  Add lamps or night lights if you need ore light. 4. It is good to have handrails on both sides of the stairs if possible.  Always fix broken handrails right away. 5. It is important to see the edges of steps.  Paint the edges of outdoor steps white so you can see them better.  Put colored tape on the edge of inside steps. 6. Throw-rugs are dangerous because they can slide.  Removing the rugs is the best idea, but if they must stay, add adhesive carpet tape to prevent slipping. 7. Do not keep things on stairs or in the halls.  Remove small furniture that blocks the halls as it may cause you to trip.  Keep telephone and electrical cords out of the way where you walk. 8. Always were sturdy, rubber-soled shoes for good support.  Never wear just socks, especially on the stairs.  Socks may cause you to slip or fall.  Do not wear full-length housecoats as you can easily trip on the bottom.  9. Place the things you use the most on the shelves that are the easiest to reach.  If you use a stepstool, make sure it is in good condition.  If you feel unsteady, DO NOT climb, ask for help. 10. If a health professional advises you to use a cane or walker, do not be ashamed.  These items can keep you  from falling and  breaking your bones.

## 2017-04-11 NOTE — Assessment & Plan Note (Signed)
Asympt, for a1c today,  to f/u any worsening symptoms or concerns

## 2017-04-11 NOTE — Assessment & Plan Note (Signed)
Also for f/u lab today,  to f/u any worsening symptoms or concerns

## 2017-04-11 NOTE — Assessment & Plan Note (Signed)
In light of ckd most likely related to this, but cant r/lo B12 or other, for cbc, b12 and iron today,  to f/u any worsening symptoms or concerns

## 2017-04-11 NOTE — Assessment & Plan Note (Signed)
Exam stable, for f/u lab today, consider renal referral

## 2017-04-11 NOTE — Assessment & Plan Note (Signed)
Most likely lower due to sleeping, but will reduce the clonidine to 0/1 bid from .1 in AM and @BARCODE2D (Error - No data available..2 in PM, fo f/u in 1 mo

## 2017-04-12 ENCOUNTER — Encounter: Payer: Self-pay | Admitting: Internal Medicine

## 2017-04-12 ENCOUNTER — Other Ambulatory Visit: Payer: Self-pay | Admitting: Internal Medicine

## 2017-04-12 MED ORDER — POTASSIUM CHLORIDE ER 10 MEQ PO TBCR: 10.0000 meq | EXTENDED_RELEASE_TABLET | Freq: Every day | ORAL | 0 refills | Status: DC

## 2017-04-13 ENCOUNTER — Telehealth: Payer: Self-pay

## 2017-04-13 NOTE — Telephone Encounter (Signed)
Pt's wife has been informed and expressed understanding.

## 2017-04-13 NOTE — Telephone Encounter (Signed)
-----   Message from Biagio Borg, MD sent at 04/12/2017  1:29 PM EDT ----- Left message on MyChart, pt to cont same tx except   The test results show that your current treatment is OK, except the potassium is mildly low, the Vitamin B12 is quite low, and the PSA has increased quite a bit.  We need to treat with a potassium pill - 1 per day for 5 days only.  Also we need to see you in the office for Vitamin B12 shots every month (indefinately), and please make sure to follow up with Dr Tresa Moore in October 2018 as planned.  You should also hear about this from the office as well.Redmond Baseman to please inform pt's WIFE (not the pt with dementia), I will do rx for K, pt needs to start monthly B12 shots here in the office asap, and fax labs to Dr [facility] urology

## 2017-04-17 ENCOUNTER — Ambulatory Visit (INDEPENDENT_AMBULATORY_CARE_PROVIDER_SITE_OTHER): Payer: Medicare Other | Admitting: General Practice

## 2017-04-17 ENCOUNTER — Telehealth: Payer: Self-pay | Admitting: Internal Medicine

## 2017-04-17 DIAGNOSIS — E538 Deficiency of other specified B group vitamins: Secondary | ICD-10-CM

## 2017-04-17 MED ORDER — CYANOCOBALAMIN 1000 MCG/ML IJ SOLN
1000.0000 ug | Freq: Once | INTRAMUSCULAR | Status: AC
  Administered 2017-04-17: 1000 ug via INTRAMUSCULAR

## 2017-04-17 NOTE — Progress Notes (Signed)
Medical screening examination/treatment/procedure(s) were performed by non-physician practitioner and as supervising physician I was immediately available for consultation/collaboration. I agree with above. Hanadi Stanly, MD   

## 2017-04-17 NOTE — Telephone Encounter (Signed)
Spouse would like to know if patient can come in today for B12?  States he is not suppose to start until next visit with Dr. Jenny Reichmann. Please advise.

## 2017-04-17 NOTE — Telephone Encounter (Signed)
Per lab report MD stated to start B12 ASAP... Pt can come today to have B12 pls call to schedule.Marland Kitchenlmb

## 2017-04-17 NOTE — Telephone Encounter (Signed)
Got scheduled today at 3:45pm

## 2017-05-08 ENCOUNTER — Ambulatory Visit (HOSPITAL_COMMUNITY)
Admission: RE | Admit: 2017-05-08 | Discharge: 2017-05-08 | Disposition: A | Discharge status: Home / Self Care | Payer: Medicare Other | Source: Ambulatory Visit | Attending: Internal Medicine | Admitting: Internal Medicine

## 2017-05-08 ENCOUNTER — Other Ambulatory Visit (HOSPITAL_BASED_OUTPATIENT_CLINIC_OR_DEPARTMENT_OTHER): Payer: Medicare Other

## 2017-05-08 DIAGNOSIS — K5730 Diverticulosis of large intestine without perforation or abscess without bleeding: Secondary | ICD-10-CM | POA: Insufficient documentation

## 2017-05-08 DIAGNOSIS — I2510 Atherosclerotic heart disease of native coronary artery without angina pectoris: Secondary | ICD-10-CM | POA: Insufficient documentation

## 2017-05-08 DIAGNOSIS — K8020 Calculus of gallbladder without cholecystitis without obstruction: Secondary | ICD-10-CM | POA: Insufficient documentation

## 2017-05-08 DIAGNOSIS — Z85118 Personal history of other malignant neoplasm of bronchus and lung: Secondary | ICD-10-CM

## 2017-05-08 DIAGNOSIS — J439 Emphysema, unspecified: Secondary | ICD-10-CM | POA: Diagnosis not present

## 2017-05-08 DIAGNOSIS — C3491 Malignant neoplasm of unspecified part of right bronchus or lung: Secondary | ICD-10-CM

## 2017-05-08 DIAGNOSIS — R911 Solitary pulmonary nodule: Secondary | ICD-10-CM | POA: Insufficient documentation

## 2017-05-08 DIAGNOSIS — N183 Chronic kidney disease, stage 3 (moderate): Secondary | ICD-10-CM | POA: Diagnosis not present

## 2017-05-08 DIAGNOSIS — I700 Atherosclerosis of aorta: Secondary | ICD-10-CM | POA: Insufficient documentation

## 2017-05-08 DIAGNOSIS — Z8546 Personal history of malignant neoplasm of prostate: Secondary | ICD-10-CM

## 2017-05-08 DIAGNOSIS — C3431 Malignant neoplasm of lower lobe, right bronchus or lung: Secondary | ICD-10-CM | POA: Diagnosis not present

## 2017-05-08 LAB — CBC WITH DIFFERENTIAL/PLATELET
BASO%: 0.4 % (ref 0.0–2.0)
BASOS ABS: 0 10*3/uL (ref 0.0–0.1)
EOS%: 2.2 % (ref 0.0–7.0)
Eosinophils Absolute: 0.1 10*3/uL (ref 0.0–0.5)
HEMATOCRIT: 30.9 % — AB (ref 38.4–49.9)
HGB: 10 g/dL — ABNORMAL LOW (ref 13.0–17.1)
LYMPH#: 1 10*3/uL (ref 0.9–3.3)
LYMPH%: 16.1 % (ref 14.0–49.0)
MCH: 27.4 pg (ref 27.2–33.4)
MCHC: 32.3 g/dL (ref 32.0–36.0)
MCV: 84.8 fL (ref 79.3–98.0)
MONO#: 0.6 10*3/uL (ref 0.1–0.9)
MONO%: 8.7 % (ref 0.0–14.0)
NEUT#: 4.6 10*3/uL (ref 1.5–6.5)
NEUT%: 72.6 % (ref 39.0–75.0)
Platelets: 145 10*3/uL (ref 140–400)
RBC: 3.64 10*6/uL — AB (ref 4.20–5.82)
RDW: 14.6 % (ref 11.0–14.6)
WBC: 6.4 10*3/uL (ref 4.0–10.3)

## 2017-05-08 LAB — COMPREHENSIVE METABOLIC PANEL
ALT: <6 U/L (ref 0–55)
ANION GAP: 8 meq/L (ref 3–11)
AST: 14 U/L (ref 5–34)
Albumin: 3.6 g/dL (ref 3.5–5.0)
Alkaline Phosphatase: 76 U/L (ref 40–150)
BUN: 27.5 mg/dL — AB (ref 7.0–26.0)
CALCIUM: 9.7 mg/dL (ref 8.4–10.4)
CHLORIDE: 106 meq/L (ref 98–109)
CO2: 25 meq/L (ref 22–29)
Creatinine: 1.6 mg/dL — ABNORMAL HIGH (ref 0.7–1.3)
EGFR: 40 mL/min/{1.73_m2} — ABNORMAL LOW (ref >90)
Glucose: 94 mg/dl (ref 70–140)
POTASSIUM: 3.9 meq/L (ref 3.5–5.1)
Sodium: 139 mEq/L (ref 136–145)
Total Bilirubin: 0.77 mg/dL (ref 0.20–1.20)
Total Protein: 6.6 g/dL (ref 6.4–8.3)

## 2017-05-10 ENCOUNTER — Ambulatory Visit: Payer: Medicare Other | Admitting: Internal Medicine

## 2017-05-10 ENCOUNTER — Telehealth: Payer: Self-pay

## 2017-05-10 NOTE — Telephone Encounter (Signed)
Wife called to rschedule appointment per 9/27 phon que

## 2017-05-14 ENCOUNTER — Ambulatory Visit: Payer: Medicare Other | Admitting: Internal Medicine

## 2017-05-14 DIAGNOSIS — G4710 Hypersomnia, unspecified: Secondary | ICD-10-CM | POA: Diagnosis not present

## 2017-05-14 DIAGNOSIS — N183 Chronic kidney disease, stage 3 (moderate): Secondary | ICD-10-CM | POA: Diagnosis not present

## 2017-05-14 DIAGNOSIS — I10 Essential (primary) hypertension: Secondary | ICD-10-CM | POA: Diagnosis not present

## 2017-05-14 DIAGNOSIS — C61 Malignant neoplasm of prostate: Secondary | ICD-10-CM | POA: Diagnosis not present

## 2017-05-14 DIAGNOSIS — C3490 Malignant neoplasm of unspecified part of unspecified bronchus or lung: Secondary | ICD-10-CM | POA: Diagnosis not present

## 2017-05-15 ENCOUNTER — Ambulatory Visit (INDEPENDENT_AMBULATORY_CARE_PROVIDER_SITE_OTHER): Payer: Medicare Other | Admitting: Internal Medicine

## 2017-05-15 ENCOUNTER — Encounter: Payer: Self-pay | Admitting: Internal Medicine

## 2017-05-15 VITALS — BP 146/82 | HR 75 | Temp 97.9°F | Ht 67.0 in | Wt 140.0 lb

## 2017-05-15 DIAGNOSIS — I10 Essential (primary) hypertension: Secondary | ICD-10-CM | POA: Diagnosis not present

## 2017-05-15 DIAGNOSIS — D649 Anemia, unspecified: Secondary | ICD-10-CM

## 2017-05-15 DIAGNOSIS — Z8546 Personal history of malignant neoplasm of prostate: Secondary | ICD-10-CM | POA: Diagnosis not present

## 2017-05-15 DIAGNOSIS — E538 Deficiency of other specified B group vitamins: Secondary | ICD-10-CM

## 2017-05-15 DIAGNOSIS — N183 Chronic kidney disease, stage 3 unspecified: Secondary | ICD-10-CM

## 2017-05-15 MED ORDER — CYANOCOBALAMIN 1000 MCG/ML IJ SOLN
1000.0000 ug | Freq: Once | INTRAMUSCULAR | Status: AC
  Administered 2017-05-15: 1000 ug via INTRAMUSCULAR

## 2017-05-15 NOTE — Patient Instructions (Signed)
You had the B12 shot today  Please continue all other medications as before, and refills have been done if requested.  Please have the pharmacy call with any other refills you may need.  Please continue your efforts at being more active, low cholesterol diet, and weight control.  You are otherwise up to date with prevention measures today.  Please keep your appointments with your specialists as you may have planned - Dr Tresa Moore Lowella Curb later this month  Please return in 6 months, or sooner if needed

## 2017-05-15 NOTE — Assessment & Plan Note (Signed)
Asympt, recent overt worsening PSA, d/w wife and pt - suspicious for worsening prostate ca, for f/u urology later this month as planned Lab Results  Component Value Date   PSA 27.49 (H) 04/11/2017   PSA 6.39 (H) 10/20/2014   PSA 0.53 04/21/2014

## 2017-05-15 NOTE — Assessment & Plan Note (Signed)
Severe, no overt bleeding, for f/u cbc today

## 2017-05-15 NOTE — Assessment & Plan Note (Signed)
For b12 IM today, to f/u any worsening symptoms or concerns  

## 2017-05-15 NOTE — Assessment & Plan Note (Signed)
stable overall by history and exam, recent data reviewed with pt, and pt to continue medical treatment as before,  to f/u any worsening symptoms or concerns Lab Results  Component Value Date   CREATININE 1.6 (H) 05/08/2017

## 2017-05-15 NOTE — Progress Notes (Signed)
Subjective:    Patient ID: Victor Davis, male    DOB: 08-10-1941, 76 y.o.   MRN: [id number]  HPI  Here to f/u with wife, much more alert today - ? Due to decreased clonidine to 0.1 bid, and tolerating ok.   Still with significant fatigue. Has low normal b2m, wife asks for shot today. Pt denies chest pain, increased sob or doe, wheezing, orthopnea, PND, increased LE swelling, palpitations, dizziness or syncope.  Pt denies new neurological symptoms such as new headache, or facial or extremity weakness or numbness   Pt denies polydipsia, polyuria.  Denies urinary symptoms such as dysuria, frequency, urgency, flank pain, hematuria or n/v, fever, chills. Past Medical History:  Diagnosis Date  . BRADYCARDIA 10/24/2010  . CALF PAIN, RIGHT 10/22/2009  . 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 CKD (chronic kidney disease), stage II   . CKD (chronic kidney disease), stage III ([place]) 02/04/2016  . Coronary artery disease     95% followed by 80% mid LAD stenosis, circumflex 80% stenosis. There was a moderate size branching obtuse marginal with 90-95% stenosis in the inferior branch. The right coronary artery had proximal 30% stenosis. Posterior lateral was moderate-sized with 40% stenosis. The EF was 65-70%. He had a DES and  x 2 to the LAD and DES x 2 to the circumflex 03/08/12.    [name] Dysuria 10/10/2010  . Emphysema 04/01/2012   By CT chest, august 2013  . ERECTILE DYSFUNCTION 04/05/2007  . FATIGUE 10/02/2007  . GERD (gastroesophageal reflux disease)   . GLUCOSE INTOLERANCE 07/15/2008  . HYPERLIPIDEMIA 07/30/2008  . HYPERSOMNIA 10/02/2007  . Impaired glucose tolerance 02/09/2011  . INSOMNIA-SLEEP DISORDER-UNSPEC 10/22/2009  . LOW BACK PAIN 04/05/2007  . OSTEOARTHRITIS 04/05/2007  . PEPTIC ULCER DISEASE 04/05/2007  . PERIPHERAL EDEMA 10/22/2009  . PERIPHERAL VASCULAR DISEASE 07/30/2008  . PROSTATE CANCER, HX OF 04/05/2007  . RESTLESS LEG SYNDROME 05/15/2007  . RHINITIS, ALLERGIC NOS 05/15/2007  . SHOULDER PAIN,  LEFT 01/22/2008  . SINUSITIS- ACUTE-NOS 05/15/2007  . Unspecified essential hypertension 04/05/2007  . Unspecified visual loss 07/15/2008  . UTI 10/24/2010   Past Surgical History:  Procedure Laterality Date  . EYE SURGERY     right  . Inguinal herniorrhapy left  2002   x 2  . KNEE SURGERY     left  . Left hip replacement    . LYMPH NODE DISSECTION Right 03/22/2015   Procedure: LYMPH NODE DISSECTION;  Surgeon: Melrose Nakayama, MD;  Location: [facility];  Service: Thoracic;  Laterality: Right;  . PERCUTANEOUS CORONARY STENT INTERVENTION (PCI-S) N/A 03/08/2012   Procedure: PERCUTANEOUS CORONARY STENT INTERVENTION (PCI-S);  Surgeon: Burnell Blanks, MD;  Location: [facility] CATH LAB;  Service: Cardiovascular;  Laterality: N/A;  . PROSTATECTOMY  2002   radical  . Right hip replacement  09/22/09  . ROTATOR CUFF REPAIR     left  . s/p ventral surgery  2009  . SEGMENTECOMY Right 03/22/2015   Procedure: RIGHT LOWER LOBE SUPERIOR SEGMENTECTOMY;  Surgeon: Melrose Nakayama, MD;  Location: [place] [facility];  Service: Thoracic;  Laterality: Right;  [name] VIDEO ASSISTED THORACOSCOPY Right 03/22/2015   Procedure: RIGHT VIDEO ASSISTED THORACOSCOPY;  Surgeon: Melrose Nakayama, MD;  Location: [place];  Service: Thoracic;  Laterality: Right;    reports that he quit smoking about 40 years ago. His smoking use included Cigarettes. He has a 15.00 pack-year smoking history. He has never used smokeless tobacco. He reports that he drinks about 0.5 oz of alcohol  per week . He reports that he does not use drugs. family history includes Heart attack (age of onset: 19) in his father; Heart attack (age of onset: 15) in his brother; Lung cancer in his sister. Allergies  Allergen Reactions  . Amitriptyline Hcl Other (See Comments)    REACTION: confusion  . Diphenhydramine Hcl Other (See Comments)    Keeps him awake  . Simvastatin Other (See Comments)    REACTION: leg pain  . Tylenol [Acetaminophen] Other (See Comments)    Dr  advised pt not to take due to finding a spot on pt's kidney   Current Outpatient Prescriptions on File Prior to Visit  Medication Sig Dispense Refill  . amLODipine (NORVASC) 10 MG tablet Take 1 tablet (10 mg total) by mouth daily. 90 tablet 0  . aspirin 81 MG EC tablet Take 81 mg by mouth daily.      . citalopram (CELEXA) 40 MG tablet Take 1 tablet (40 mg total) by mouth daily. 90 tablet 3  . cloNIDine (CATAPRES) 0.1 MG tablet TAKE 1 TABLET BY MOUTH IN  THE MORNING THEN TAKE 1  TABLETS IN THE EVENING 180 tablet 3  . clopidogrel (PLAVIX) 75 MG tablet TAKE 1 TABLET BY MOUTH  DAILY 90 tablet 0  . donepezil (ARICEPT) 5 MG tablet Take 1 tablet (5 mg total) by mouth at bedtime. 90 tablet 3  . gabapentin (NEURONTIN) 100 MG capsule TAKE 1 CAPSULE BY MOUTH TWO TIMES DAILY 180 capsule 0  . isosorbide mononitrate (IMDUR) 30 MG 24 hr tablet Take 1 tablet (30 mg total) by mouth daily. 90 tablet 0  . lovastatin (MEVACOR) 40 MG tablet TAKE 1 TABLET BY MOUTH AT  BEDTIME 90 tablet 0  . meclizine (ANTIVERT) 25 MG tablet Take 0.5-1 tablets (12.5-25 mg total) by mouth 3 (three) times daily as needed for dizziness. 30 tablet 0  . NITROSTAT 0.4 MG SL tablet PLACE 1 TABLET (0.4 MG TOTAL) UNDER THE TONGUE EVERY 5 (FIVE) MINUTES AS NEEDED FOR CHEST PAIN. 25 tablet 5  . pantoprazole (PROTONIX) 40 MG tablet Take 1 tablet (40 mg total) by mouth daily. 90 tablet 0  . potassium chloride (K-DUR) 10 MEQ tablet Take 1 tablet (10 mEq total) by mouth daily. 5 tablet 0  . pramipexole (MIRAPEX) 1 MG tablet Take 1 tablet (1 mg total) by mouth 3 (three) times daily. 270 tablet 0  . traMADol (ULTRAM) 50 MG tablet Take 1 tablet (50 mg total) by mouth every 8 (eight) hours as needed. (Patient taking differently: Take 50 mg by mouth every 8 (eight) hours as needed for severe pain. ) 60 tablet 1   No current facility-administered medications on file prior to visit.    Review of Systems  Constitutional: Negative for other unusual  diaphoresis or sweats HENT: Negative for ear discharge or swelling Eyes: Negative for other worsening visual disturbances Respiratory: Negative for stridor or other swelling  Gastrointestinal: Negative for worsening distension or other blood Genitourinary: Negative for retention or other urinary change Musculoskeletal: Negative for other MSK pain or swelling Skin: Negative for color change or other new lesions Neurological: Negative for worsening tremors and other numbness  Psychiatric/Behavioral: Negative for worsening agitation or other fatigue All other system neg per pt    Objective:   Physical Exam .BP (!) 146/82   Pulse 75   Temp 97.9 F (36.6 C) (Oral)   Ht 5\' 7"  (1.702 m)   Wt 140 lb (63.5 kg)   SpO2 97%  BMI 21.93 kg/m  VS noted,  Constitutional: Pt appears in NAD HENT: Head: NCAT.  Right Ear: External ear normal.  Left Ear: External ear normal.  Eyes: . Pupils are equal, round, and reactive to light. Conjunctivae and EOM are normal Nose: without d/c or deformity Neck: Neck supple. Gross normal ROM Cardiovascular: Normal rate and regular rhythm.   Pulmonary/Chest: Effort normal and breath sounds without rales or wheezing.  Abd:  Soft, NT, ND, + BS, no organomegaly Neurological: Pt is alert. At baseline orientation, motor grossly intact Skin: Skin is warm. No rashes, other new lesions, no LE edema Psychiatric: Pt behavior is normal without agitation  No other exam findings       Assessment & Plan:

## 2017-05-15 NOTE — Assessment & Plan Note (Signed)
stable overall by history and exam, recent data reviewed with pt, and pt to continue medical treatment as before,  to f/u any worsening symptoms or concerns BP Readings from Last 3 Encounters:  05/15/17 (!) 146/82  04/11/17 (!) 104/44  01/04/17 130/63

## 2017-05-19 ENCOUNTER — Other Ambulatory Visit: Payer: Self-pay | Admitting: Internal Medicine

## 2017-05-29 ENCOUNTER — Encounter: Payer: Self-pay | Admitting: Thoracic Surgery (Cardiothoracic Vascular Surgery)

## 2017-05-29 ENCOUNTER — Ambulatory Visit (INDEPENDENT_AMBULATORY_CARE_PROVIDER_SITE_OTHER): Payer: Medicare Other | Admitting: Thoracic Surgery (Cardiothoracic Vascular Surgery)

## 2017-05-29 VITALS — BP 180/79 | HR 65 | Resp 20 | Ht 67.0 in | Wt 141.0 lb

## 2017-05-29 DIAGNOSIS — C3491 Malignant neoplasm of unspecified part of right bronchus or lung: Secondary | ICD-10-CM | POA: Diagnosis not present

## 2017-05-29 DIAGNOSIS — I10 Essential (primary) hypertension: Secondary | ICD-10-CM

## 2017-05-29 NOTE — Progress Notes (Signed)
[address]       [place] [address]             [phone]       HPI: Victor Davis returns for a scheduled 2 year follow-up visit  Victor Davis is a 76 year old man with a remote history of tobacco abuse, atherosclerotic cardiovascular disease, hypertension, stage III chronic kidney disease, reflux, and COPD.  He had a thoracoscopic right lower lobe superior segmentectomy in August 2016. He had a stage IA non-small cell carcinoma. He did not require adjuvant therapy.   I last saw him in October 2017. He was doing well from a surgical standpoint but was having problems related to his appetite and kidney disease.  He says he's been feeling well. He denies chest pain and shortness of breath.he says his appetite is a little better.  Past Medical History:  Diagnosis Date  . BRADYCARDIA 10/24/2010  . CALF PAIN, RIGHT 10/22/2009  . 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 CKD (chronic kidney disease), stage II   . CKD (chronic kidney disease), stage III ([place]) 02/04/2016  . Coronary artery disease     95% followed by 80% mid LAD stenosis, circumflex 80% stenosis. There was a moderate size branching obtuse marginal with 90-95% stenosis in the inferior branch. The right coronary artery had proximal 30% stenosis. Posterior lateral was moderate-sized with 40% stenosis. The EF was 65-70%. He had a DES and  x 2 to the LAD and DES x 2 to the circumflex 03/08/12.    [name] Dysuria 10/10/2010  . Emphysema 04/01/2012   By CT chest, august 2013  . ERECTILE DYSFUNCTION 04/05/2007  . FATIGUE 10/02/2007  . GERD (gastroesophageal reflux disease)   . GLUCOSE INTOLERANCE 07/15/2008  . HYPERLIPIDEMIA 07/30/2008  . HYPERSOMNIA 10/02/2007  . Impaired glucose tolerance 02/09/2011  . INSOMNIA-SLEEP DISORDER-UNSPEC 10/22/2009  . LOW BACK PAIN 04/05/2007  . OSTEOARTHRITIS 04/05/2007  . PEPTIC ULCER DISEASE 04/05/2007  . PERIPHERAL EDEMA 10/22/2009  . PERIPHERAL VASCULAR DISEASE 07/30/2008  . PROSTATE  CANCER, HX OF 04/05/2007  . RESTLESS LEG SYNDROME 05/15/2007  . RHINITIS, ALLERGIC NOS 05/15/2007  . SHOULDER PAIN, LEFT 01/22/2008  . SINUSITIS- ACUTE-NOS 05/15/2007  . Unspecified essential hypertension 04/05/2007  . Unspecified visual loss 07/15/2008  . UTI 10/24/2010    Current Outpatient Prescriptions  Medication Sig Dispense Refill  . amLODipine (NORVASC) 10 MG tablet Take 1 tablet (10 mg total) by mouth daily. 90 tablet 0  . aspirin 81 MG EC tablet Take 81 mg by mouth daily.      . citalopram (CELEXA) 40 MG tablet TAKE 1 TABLET BY MOUTH  DAILY 90 tablet 2  . cloNIDine (CATAPRES) 0.1 MG tablet TAKE 1 TABLET BY MOUTH IN  THE MORNING THEN TAKE 1  TABLETS IN THE EVENING 180 tablet 3  . clopidogrel (PLAVIX) 75 MG tablet TAKE 1 TABLET BY MOUTH  DAILY 90 tablet 2  . donepezil (ARICEPT) 5 MG tablet Take 1 tablet (5 mg total) by mouth at bedtime. 90 tablet 3  . gabapentin (NEURONTIN) 100 MG capsule TAKE 1 CAPSULE BY MOUTH TWO TIMES DAILY 180 capsule 0  . isosorbide mononitrate (IMDUR) 30 MG 24 hr tablet Take 1 tablet (30 mg total) by mouth daily. 90 tablet 0  . lovastatin (MEVACOR) 40 MG tablet TAKE 1 TABLET BY MOUTH AT  BEDTIME 90 tablet 2  . meclizine (ANTIVERT) 25 MG tablet Take 0.5-1 tablets (12.5-25 mg total) by mouth 3 (three) times daily as  needed for dizziness. 30 tablet 0  . NITROSTAT 0.4 MG SL tablet PLACE 1 TABLET (0.4 MG TOTAL) UNDER THE TONGUE EVERY 5 (FIVE) MINUTES AS NEEDED FOR CHEST PAIN. 25 tablet 5  . pantoprazole (PROTONIX) 40 MG tablet Take 1 tablet (40 mg total) by mouth daily. 90 tablet 0  . potassium chloride (K-DUR) 10 MEQ tablet Take 1 tablet (10 mEq total) by mouth daily. 5 tablet 0  . pramipexole (MIRAPEX) 1 MG tablet Take 1 tablet (1 mg total) by mouth 3 (three) times daily. 270 tablet 0   No current facility-administered medications for this visit.     Physical Exam: BP (!) 180/79   Pulse 65   Resp 20   Ht 5\' 7"  (1.702 m)   Wt 141 lb (64 kg)   SpO2 98%   BMI  22.47 kg/m  76 year old man in no acute distress Alert and oriented 3 with no focal deficits Lungs diminished with equal breath sounds bilaterally Cardiac regular rate and rhythm with a 2/6 systolic murmur No cervical or supraclavicular adenopathy  Diagnostic Tests: CT CHEST WITHOUT CONTRAST  TECHNIQUE: Multidetector CT imaging of the chest was performed following the standard protocol without IV contrast.  COMPARISON:  11/07/2016 chest CT.  FINDINGS: Cardiovascular: Normal heart size. No significant pericardial fluid/thickening. Left main, left anterior descending, left circumflex and right coronary atherosclerosis. Atherosclerotic nonaneurysmal thoracic aorta. Normal caliber pulmonary arteries.  Mediastinum/Nodes: No discrete thyroid nodules. Unremarkable esophagus. No pathologically enlarged axillary, mediastinal or gross hilar lymph nodes, noting limited sensitivity for the detection of hilar adenopathy on this noncontrast study.  Lungs/Pleura: No pneumothorax. No pleural effusion. Mild centrilobular and paraseptal emphysema with mild diffuse bronchial wall thickening. Stable postsurgical changes from right lower lobe superior segmentectomy with associated mild scarring along the suture line. No acute consolidative airspace disease or lung masses. Peripheral left lower lobe 7 mm ground-glass pulmonary nodule (series 5/ image 102), stable back to 02/23/2015 chest CT. No new significant pulmonary nodules.  Upper abdomen: Cholelithiasis. Scattered colonic diverticulosis.  Musculoskeletal: No aggressive appearing focal osseous lesions. Moderate thoracic spondylosis.  IMPRESSION: 1. No evidence of local tumor recurrence status post right lower lobe superior segmentectomy. 2. No evidence of metastatic disease in the chest . 3. Solitary left lower lobe 7 mm ground-glass pulmonary nodule, for which 2 year stability has been demonstrated. Repeat noncontrast chest  CT is recommended every 2 years until 5 years of stability has been established. This recommendation follows the consensus statement: Guidelines for Management of Incidental Pulmonary Nodules Detected on CT Images: From the Fleischner Society 2017; Radiology 2017; 284:228-243. 4. Left main and 3 vessel coronary atherosclerosis. 5. Cholelithiasis. 6. Colonic diverticulosis.  Aortic Atherosclerosis (ICD10-I70.0) and Emphysema (ICD10-J43.9).   Electronically Signed   By: Ilona Sorrel M.D.   On: 05/08/2017 10:58 I personally reviewed the CT chest and concur with the findings noted above  Impression: Victor Davis is a 76 year old gentleman who is now 2 years out from a right lower lobe superior segmentectomy for stage IA non-small cell carcinoma. He has no evidence recurrent disease.  He does have a 7 mm ground glass nodule in the left lower lobe. That has been stable. He will be getting continued follow-up as surveillance for his previous lung cancer.  Hypertension- blood pressure is markedly elevated today. He saw Dr. Jenny Reichmann 2 weeks ago. His blood pressure was 146/82 at that time. He says he has not been checking it on a regular basis. I encouraged him to check his blood  pressure at home daily. If it is persistently above 140 he should contact Dr. Jenny Reichmann.  Plan: Follow-up with Dr. Julien Nordmann as scheduled  Return in one year   Melrose Nakayama, MD [facility] [phone]

## 2017-05-30 ENCOUNTER — Ambulatory Visit (HOSPITAL_BASED_OUTPATIENT_CLINIC_OR_DEPARTMENT_OTHER): Payer: Medicare Other | Admitting: Internal Medicine

## 2017-05-30 ENCOUNTER — Encounter: Payer: Self-pay | Admitting: Internal Medicine

## 2017-05-30 VITALS — BP 140/61 | HR 52 | Temp 97.5°F | Resp 18 | Ht 67.0 in | Wt 141.1 lb

## 2017-05-30 DIAGNOSIS — Z85118 Personal history of other malignant neoplasm of bronchus and lung: Secondary | ICD-10-CM | POA: Diagnosis not present

## 2017-05-30 DIAGNOSIS — Z8546 Personal history of malignant neoplasm of prostate: Secondary | ICD-10-CM | POA: Diagnosis not present

## 2017-05-30 DIAGNOSIS — C3490 Malignant neoplasm of unspecified part of unspecified bronchus or lung: Secondary | ICD-10-CM

## 2017-05-30 DIAGNOSIS — N289 Disorder of kidney and ureter, unspecified: Secondary | ICD-10-CM | POA: Diagnosis not present

## 2017-05-30 DIAGNOSIS — C3491 Malignant neoplasm of unspecified part of right bronchus or lung: Secondary | ICD-10-CM

## 2017-05-30 DIAGNOSIS — R5383 Other fatigue: Secondary | ICD-10-CM | POA: Diagnosis not present

## 2017-05-30 NOTE — Progress Notes (Signed)
[place] [phone]   [phone]  OFFICE PROGRESS NOTE  Biagio Borg, MD [place] [address]  DIAGNOSIS:  1) Stage IA (T1a, N0, M0) non-small cell lung cancer, well-differentiated adenocarcinoma diagnosed in July 2016. 2) history of prostate adenocarcinoma managed by Dr. Tresa Moore at the [facility]  PRIOR THERAPY: Status post right lower lobe superior segmentectomy with lymph node sampling under the care of Dr. Roxan Hockey on 03/22/2015.  CURRENT THERAPY: Observation.  INTERVAL HISTORY: Victor Davis 76 y.o. male returns to the clinic today for 6 months follow-up visit. The patient is feeling fine today with no specific complaints except for fatigue and falling during sleep. He denied having any chest pain but continues to have shortness breath increased with exertion with no cough or hemoptysis. He has no fever, chills, headache or visual changes. He denied having any weight loss or night sweats. He has no nausea, vomiting, diarrhea or constipation. He had repeat CT scan of the chest performed recently and he is here for evaluation and discussion of his scan results.  MEDICAL HISTORY: Past Medical History:  Diagnosis Date  . BRADYCARDIA 10/24/2010  . CALF PAIN, RIGHT 10/22/2009  . 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 CKD (chronic kidney disease), stage II   . CKD (chronic kidney disease), stage III ([place]) 02/04/2016  . Coronary artery disease     95% followed by 80% mid LAD stenosis, circumflex 80% stenosis. There was a moderate size branching obtuse marginal with 90-95% stenosis in the inferior branch. The right coronary artery had proximal 30% stenosis. Posterior lateral was moderate-sized with 40% stenosis. The EF was 65-70%. He had a DES and  x 2 to the LAD and DES x 2 to the circumflex 03/08/12.    [name] Dysuria 10/10/2010  . Emphysema 04/01/2012   By CT chest, august 2013  . ERECTILE DYSFUNCTION 04/05/2007  . FATIGUE  10/02/2007  . GERD (gastroesophageal reflux disease)   . GLUCOSE INTOLERANCE 07/15/2008  . HYPERLIPIDEMIA 07/30/2008  . HYPERSOMNIA 10/02/2007  . Impaired glucose tolerance 02/09/2011  . INSOMNIA-SLEEP DISORDER-UNSPEC 10/22/2009  . LOW BACK PAIN 04/05/2007  . OSTEOARTHRITIS 04/05/2007  . PEPTIC ULCER DISEASE 04/05/2007  . PERIPHERAL EDEMA 10/22/2009  . PERIPHERAL VASCULAR DISEASE 07/30/2008  . PROSTATE CANCER, HX OF 04/05/2007  . RESTLESS LEG SYNDROME 05/15/2007  . RHINITIS, ALLERGIC NOS 05/15/2007  . SHOULDER PAIN, LEFT 01/22/2008  . SINUSITIS- ACUTE-NOS 05/15/2007  . Unspecified essential hypertension 04/05/2007  . Unspecified visual loss 07/15/2008  . UTI 10/24/2010    ALLERGIES:  is allergic to amitriptyline hcl; diphenhydramine hcl; simvastatin; and tylenol [acetaminophen].  MEDICATIONS:  Current Outpatient Prescriptions  Medication Sig Dispense Refill  . amLODipine (NORVASC) 10 MG tablet Take 1 tablet (10 mg total) by mouth daily. 90 tablet 0  . aspirin 81 MG EC tablet Take 81 mg by mouth daily.      . citalopram (CELEXA) 40 MG tablet TAKE 1 TABLET BY MOUTH  DAILY 90 tablet 2  . cloNIDine (CATAPRES) 0.1 MG tablet TAKE 1 TABLET BY MOUTH IN  THE MORNING THEN TAKE 1  TABLETS IN THE EVENING 180 tablet 3  . clopidogrel (PLAVIX) 75 MG tablet TAKE 1 TABLET BY MOUTH  DAILY 90 tablet 2  . donepezil (ARICEPT) 5 MG tablet Take 1 tablet (5 mg total) by mouth at bedtime. 90 tablet 3  . gabapentin (NEURONTIN) 100 MG capsule TAKE 1 CAPSULE BY MOUTH TWO TIMES DAILY 180  capsule 0  . isosorbide mononitrate (IMDUR) 30 MG 24 hr tablet Take 1 tablet (30 mg total) by mouth daily. 90 tablet 0  . lovastatin (MEVACOR) 40 MG tablet TAKE 1 TABLET BY MOUTH AT  BEDTIME 90 tablet 2  . meclizine (ANTIVERT) 25 MG tablet Take 0.5-1 tablets (12.5-25 mg total) by mouth 3 (three) times daily as needed for dizziness. 30 tablet 0  . pantoprazole (PROTONIX) 40 MG tablet Take 1 tablet (40 mg total) by mouth daily. 90 tablet 0  .  potassium chloride (K-DUR) 10 MEQ tablet Take 1 tablet (10 mEq total) by mouth daily. 5 tablet 0  . pramipexole (MIRAPEX) 1 MG tablet Take 1 tablet (1 mg total) by mouth 3 (three) times daily. 270 tablet 0  . NITROSTAT 0.4 MG SL tablet PLACE 1 TABLET (0.4 MG TOTAL) UNDER THE TONGUE EVERY 5 (FIVE) MINUTES AS NEEDED FOR CHEST PAIN. (Patient not taking: Reported on 05/30/2017) 25 tablet 5   No current facility-administered medications for this visit.     SURGICAL HISTORY:  Past Surgical History:  Procedure Laterality Date  . EYE SURGERY     right  . Inguinal herniorrhapy left  2002   x 2  . KNEE SURGERY     left  . Left hip replacement    . LYMPH NODE DISSECTION Right 03/22/2015   Procedure: LYMPH NODE DISSECTION;  Surgeon: Melrose Nakayama, MD;  Location: [facility];  Service: Thoracic;  Laterality: Right;  . PERCUTANEOUS CORONARY STENT INTERVENTION (PCI-S) N/A 03/08/2012   Procedure: PERCUTANEOUS CORONARY STENT INTERVENTION (PCI-S);  Surgeon: Burnell Blanks, MD;  Location: [facility] C[facility] LAB;  Service: Cardiovascular;  Laterality: N/A;  . PROSTATECTOMY  2002   radical  . Right hip replacement  09/22/09  . ROTATOR CUFF REPAIR     left  . s/p ventral surgery  2009  . SEGMENTECOMY Right 03/22/2015   Procedure: RIGHT LOWER LOBE SUPERIOR SEGMENTECTOMY;  Surgeon: Melrose Nakayama, MD;  Location: [place] [facility];  Service: Thoracic;  Laterality: Right;  [name] VIDEO ASSISTED THORACOSCOPY Right 03/22/2015   Procedure: RIGHT VIDEO ASSISTED THORACOSCOPY;  Surgeon: Melrose Nakayama, MD;  Location: [facility];  Service: Thoracic;  Laterality: Right;    REVIEW OF SYSTEMS:  A comprehensive review of systems was negative except for: Constitutional: positive for fatigue Musculoskeletal: positive for muscle weakness   PHYSICAL EXAMINATION: General appearance: alert, cooperative, fatigued and no distress Head: Normocephalic, without obvious abnormality, atraumatic Neck: no adenopathy, no JVD, supple, symmetrical,  trachea midline and thyroid not enlarged, symmetric, no tenderness/mass/nodules Lymph nodes: Cervical, supraclavicular, and axillary nodes normal. Resp: clear to auscultation bilaterally Back: symmetric, no curvature. ROM normal. No CVA tenderness. Cardio: regular rate and rhythm, S1, S2 normal, no murmur, click, rub or gallop GI: soft, non-tender; bowel sounds normal; no masses,  no organomegaly Extremities: extremities normal, atraumatic, no cyanosis or edema  ECOG PERFORMANCE STATUS: 2 - Symptomatic, <50% confined to bed  Blood pressure 140/61, pulse (!) 52, temperature (!) 97.5 F (36.4 C), temperature source Oral, resp. rate 18, height 5\' 7"  (1.702 m), weight 141 lb 1.6 oz (64 kg), SpO2 100 %.  LABORATORY DATA: Lab Results  Component Value Date   WBC 6.4 05/08/2017   HGB 10.0 (L) 05/08/2017   HCT 30.9 (L) 05/08/2017   MCV 84.8 05/08/2017   PLT 145 05/08/2017      Chemistry      Component Value Date/Time   NA 139 05/08/2017 0826   K 3.9 05/08/2017 0826  CL 106 04/11/2017 1013   CO2 25 05/08/2017 0826   BUN 27.5 (H) 05/08/2017 0826   CREATININE 1.6 (H) 05/08/2017 0826      Component Value Date/Time   CALCIUM 9.7 05/08/2017 0826   ALKPHOS 76 05/08/2017 0826   AST 14 05/08/2017 0826   ALT <6 05/08/2017 0826   BILITOT 0.77 05/08/2017 0826       RADIOGRAPHIC STUDIES: Ct Chest Wo Contrast  Result Date: 05/08/2017 CLINICAL DATA:  Re- stage non-small cell right lower lobe lung cancers status post wedge resection in November 2016. Prostate cancer. EXAM: CT CHEST WITHOUT CONTRAST TECHNIQUE: Multidetector CT imaging of the chest was performed following the standard protocol without IV contrast. COMPARISON:  11/07/2016 chest CT. FINDINGS: Cardiovascular: Normal heart size. No significant pericardial fluid/thickening. Left main, left anterior descending, left circumflex and right coronary atherosclerosis. Atherosclerotic nonaneurysmal thoracic aorta. Normal caliber pulmonary  arteries. Mediastinum/Nodes: No discrete thyroid nodules. Unremarkable esophagus. No pathologically enlarged axillary, mediastinal or gross hilar lymph nodes, noting limited sensitivity for the detection of hilar adenopathy on this noncontrast study. Lungs/Pleura: No pneumothorax. No pleural effusion. Mild centrilobular and paraseptal emphysema with mild diffuse bronchial wall thickening. Stable postsurgical changes from right lower lobe superior segmentectomy with associated mild scarring along the suture line. No acute consolidative airspace disease or lung masses. Peripheral left lower lobe 7 mm ground-glass pulmonary nodule (series 5/ image 102), stable back to 02/23/2015 chest CT. No new significant pulmonary nodules. Upper abdomen: Cholelithiasis. Scattered colonic diverticulosis. Musculoskeletal: No aggressive appearing focal osseous lesions. Moderate thoracic spondylosis. IMPRESSION: 1. No evidence of local tumor recurrence status post right lower lobe superior segmentectomy. 2. No evidence of metastatic disease in the chest . 3. Solitary left lower lobe 7 mm ground-glass pulmonary nodule, for which 2 year stability has been demonstrated. Repeat noncontrast chest CT is recommended every 2 years until 5 years of stability has been established. This recommendation follows the consensus statement: Guidelines for Management of Incidental Pulmonary Nodules Detected on CT Images: From the Fleischner Society 2017; Radiology 2017; 284:228-243. 4. Left main and 3 vessel coronary atherosclerosis. 5. Cholelithiasis. 6. Colonic diverticulosis. Aortic Atherosclerosis (ICD10-I70.0) and Emphysema (ICD10-J43.9). Electronically Signed   By: Ilona Sorrel M.D.   On: 05/08/2017 10:58    ASSESSMENT AND PLAN:  This is a very pleasant 76 years old white male with history of stage IA non-small cell lung cancer, adenocarcinoma status post right lower lobectomy with lymph node dissection in August 2016 and has been observation  since that time. The patient is feeling fine today with no specific complaints except for fatigue. His recent CT scan of the chest showed no evidence for disease recurrence. I recommended for the patient to continue on observation and repeat CT scan of the chest in one year. He would come back for follow-up visit at that time. He will continue his routine follow-up visit with his nephrologist and urologist for the renal insufficiency and prostate cancer. The patient was advised to call immediately if he has any concerning symptoms in the interval. The patient voices understanding of current disease status and treatment options and is in agreement with the current care plan.  All questions were answered. The patient knows to call the clinic with any problems, questions or concerns. We can certainly see the patient much sooner if necessary. I spent 10 minutes counseling the patient face to face. The total time spent in the appointment was 15 minutes.  Disclaimer: This note was dictated with voice recognition software. Similar  sounding words can inadvertently be transcribed and may not be corrected upon review.

## 2017-06-04 ENCOUNTER — Other Ambulatory Visit: Payer: Self-pay | Admitting: *Deleted

## 2017-06-04 DIAGNOSIS — I6529 Occlusion and stenosis of unspecified carotid artery: Secondary | ICD-10-CM

## 2017-06-07 ENCOUNTER — Ambulatory Visit (HOSPITAL_COMMUNITY)
Admission: EM | Admit: 2017-06-07 | Discharge: 2017-06-07 | Disposition: A | Discharge status: Home / Self Care | Payer: Medicare Other | Attending: Family Medicine | Admitting: Family Medicine

## 2017-06-07 ENCOUNTER — Encounter (HOSPITAL_COMMUNITY): Payer: Self-pay | Admitting: Emergency Medicine

## 2017-06-07 DIAGNOSIS — C61 Malignant neoplasm of prostate: Secondary | ICD-10-CM | POA: Diagnosis not present

## 2017-06-07 DIAGNOSIS — S51812A Laceration without foreign body of left forearm, initial encounter: Secondary | ICD-10-CM | POA: Diagnosis not present

## 2017-06-07 DIAGNOSIS — S51819A Laceration without foreign body of unspecified forearm, initial encounter: Secondary | ICD-10-CM

## 2017-06-07 DIAGNOSIS — W19XXXA Unspecified fall, initial encounter: Secondary | ICD-10-CM

## 2017-06-07 DIAGNOSIS — R42 Dizziness and giddiness: Secondary | ICD-10-CM | POA: Diagnosis not present

## 2017-06-07 DIAGNOSIS — Z23 Encounter for immunization: Secondary | ICD-10-CM | POA: Diagnosis not present

## 2017-06-07 MED ORDER — TETANUS-DIPHTH-ACELL PERTUSSIS 5-2.5-18.5 LF-MCG/0.5 IM SUSP
0.5000 mL | Freq: Once | INTRAMUSCULAR | Status: AC
  Administered 2017-06-07: 0.5 mL via INTRAMUSCULAR

## 2017-06-07 MED ORDER — TETANUS-DIPHTH-ACELL PERTUSSIS 5-2.5-18.5 LF-MCG/0.5 IM SUSP
INTRAMUSCULAR | Status: AC
  Filled 2017-06-07: qty 0.5

## 2017-06-07 NOTE — ED Triage Notes (Addendum)
Pt here for trip and fall with laceration to left arm; bleeding controlled at present; pt takes plavix

## 2017-06-07 NOTE — Discharge Instructions (Signed)
Keep wound clean and dry. Watch for any signs of infection. Problems with the arm returned. If you develop problems with unusual sleepiness, confusion, disorientation, nausea or vomiting, dizziness or vertigo, problems with vision, speech, weakness and one normal leg, headache or anything unusual go to the emergency department promptly. Instructions given to patient and wife.

## 2017-06-07 NOTE — ED Provider Notes (Signed)
[place]    CSN: [id number] Arrival date & time: 06/07/17  1817     History   Chief Complaint Chief Complaint  Patient presents with  . Fall  . Laceration    HPI Victor Davis is a 76 y.o. male.   76 year old male states that he was going down some steps and felt a little dizzy and fell. He scraped the left forearm on part of the deck thinking it may been a nail. This produced a superficial small skin tear to left forearm. He also states that he dropped his head in the left frontoparietal area. He was dizzy for several minutes. That is since abated. He has no neurologic complaints now. No bruit or of last tetanus immunization. Denies other injury.      Past Medical History:  Diagnosis Date  . BRADYCARDIA 10/24/2010  . CALF PAIN, RIGHT 10/22/2009  . 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 CKD (chronic kidney disease), stage II   . CKD (chronic kidney disease), stage III ([place]) 02/04/2016  . Coronary artery disease     95% followed by 80% mid LAD stenosis, circumflex 80% stenosis. There was a moderate size branching obtuse marginal with 90-95% stenosis in the inferior branch. The right coronary artery had proximal 30% stenosis. Posterior lateral was moderate-sized with 40% stenosis. The EF was 65-70%. He had a DES and  x 2 to the LAD and DES x 2 to the circumflex 03/08/12.    [name] Dysuria 10/10/2010  . Emphysema 04/01/2012   By CT chest, august 2013  . ERECTILE DYSFUNCTION 04/05/2007  . FATIGUE 10/02/2007  . GERD (gastroesophageal reflux disease)   . GLUCOSE INTOLERANCE 07/15/2008  . HYPERLIPIDEMIA 07/30/2008  . HYPERSOMNIA 10/02/2007  . Impaired glucose tolerance 02/09/2011  . INSOMNIA-SLEEP DISORDER-UNSPEC 10/22/2009  . LOW BACK PAIN 04/05/2007  . OSTEOARTHRITIS 04/05/2007  . PEPTIC ULCER DISEASE 04/05/2007  . PERIPHERAL EDEMA 10/22/2009  . PERIPHERAL VASCULAR DISEASE 07/30/2008  . PROSTATE CANCER, HX OF 04/05/2007  . RESTLESS LEG SYNDROME 05/15/2007  . RHINITIS,  ALLERGIC NOS 05/15/2007  . SHOULDER PAIN, LEFT 01/22/2008  . SINUSITIS- ACUTE-NOS 05/15/2007  . Unspecified essential hypertension 04/05/2007  . Unspecified visual loss 07/15/2008  . UTI 10/24/2010    Patient Active Problem List   Diagnosis Date Noted  . B12 deficiency 04/11/2017  . CKD (chronic kidney disease), stage III ([place]) 02/04/2016  . Snoring 01/21/2016  . Mild cognitive impairment 10/08/2015  . Multiple bruises 10/08/2015  . Non-small cell carcinoma of right lung, stage 1 ([place]) 04/29/2015  . Lung nodule 03/22/2015  . Cholecystostomy drain infection ([place]) 01/25/2015  . Abdominal pain 01/18/2015  . Cholecystitis 01/18/2015  . Acute on chronic kidney failure ([place]) 01/18/2015  . Right upper quadrant pain   . Acute cholecystitis   . Renal cyst 12/31/2012  . Hearing loss 07/05/2012  . Lesion of nose 07/05/2012  . Anemia, unspecified 04/02/2012  . Pulmonary emphysema ([place]) 04/01/2012  . Hypertension 03/09/2012  . Pulmonary nodule, right 02/08/2012  . Mitral regurgitation 02/06/2012  . CAD (coronary artery disease) 01/04/2012  . Unspecified disorder of liver 12/18/2011  . Hyperthyroidism 12/18/2011  . Nonspecific abnormal findings on radiological and examination of lung field 12/18/2011  . Impaired glucose tolerance 02/09/2011  . Encounter for well adult exam with abnormal findings 02/09/2011  . BRADYCARDIA 10/24/2010  . INSOMNIA-SLEEP DISORDER-UNSPEC 10/22/2009  . PERIPHERAL EDEMA 10/22/2009  . HLD (hyperlipidemia) 07/30/2008  . Peripheral vascular disease ([place]) 07/30/2008  . 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 Unspecified  visual loss 07/15/2008  . SHOULDER PAIN, LEFT 01/22/2008  . Hypersomnia 10/02/2007  . RESTLESS LEG SYNDROME 05/15/2007  . RHINITIS, ALLERGIC NOS 05/15/2007  . ERECTILE DYSFUNCTION 04/05/2007  . PEPTIC ULCER DISEASE 04/05/2007  . Osteoarthritis 04/05/2007  . LOW BACK PAIN 04/05/2007  . PROSTATE CANCER, HX OF 04/05/2007    Past Surgical History:    Procedure Laterality Date  . EYE SURGERY     right  . Inguinal herniorrhapy left  2002   x 2  . KNEE SURGERY     left  . Left hip replacement    . LYMPH NODE DISSECTION Right 03/22/2015   Procedure: LYMPH NODE DISSECTION;  Surgeon: Melrose Nakayama, MD;  Location: [place];  Service: Thoracic;  Laterality: Right;  . PERCUTANEOUS CORONARY STENT INTERVENTION (PCI-S) N/A 03/08/2012   Procedure: PERCUTANEOUS CORONARY STENT INTERVENTION (PCI-S);  Surgeon: Burnell Blanks, MD;  Location: [facility] CATH LAB;  Service: Cardiovascular;  Laterality: N/A;  . PROSTATECTOMY  2002   radical  . Right hip replacement  09/22/09  . ROTATOR CUFF REPAIR     left  . s/p ventral surgery  2009  . SEGMENTECOMY Right 03/22/2015   Procedure: RIGHT LOWER LOBE SUPERIOR SEGMENTECTOMY;  Surgeon: Melrose Nakayama, MD;  Location: [place];  Service: Thoracic;  Laterality: Right;  [name] VIDEO ASSISTED THORACOSCOPY Right 03/22/2015   Procedure: RIGHT VIDEO ASSISTED THORACOSCOPY;  Surgeon: Melrose Nakayama, MD;  Location: [place];  Service: Thoracic;  Laterality: Right;       Home Medications    Prior to Admission medications   Medication Sig Start Date End Date Taking? Authorizing Provider  amLODipine (NORVASC) 10 MG tablet Take 1 tablet (10 mg total) by mouth daily. 02/06/17   Biagio Borg, MD  aspirin 81 MG EC tablet Take 81 mg by mouth daily.      [provider]  citalopram (CELEXA) 40 MG tablet TAKE 1 TABLET BY MOUTH  DAILY 05/21/17   Biagio Borg, MD  cloNIDine (CATAPRES) 0.1 MG tablet TAKE 1 TABLET BY MOUTH IN  THE MORNING THEN TAKE 1  TABLETS IN THE EVENING 04/11/17   Biagio Borg, MD  clopidogrel (PLAVIX) 75 MG tablet TAKE 1 TABLET BY MOUTH  DAILY 05/21/17   Biagio Borg, MD  donepezil (ARICEPT) 5 MG tablet Take 1 tablet (5 mg total) by mouth at bedtime. 11/12/15   Biagio Borg, MD  gabapentin (NEURONTIN) 100 MG capsule TAKE 1 CAPSULE BY MOUTH TWO TIMES DAILY 01/03/17   Biagio Borg, MD  isosorbide  mononitrate (IMDUR) 30 MG 24 hr tablet Take 1 tablet (30 mg total) by mouth daily. 02/08/17   Biagio Borg, MD  lovastatin (MEVACOR) 40 MG tablet TAKE 1 TABLET BY MOUTH AT  BEDTIME 05/21/17   Biagio Borg, MD  meclizine (ANTIVERT) 25 MG tablet Take 0.5-1 tablets (12.5-25 mg total) by mouth 3 (three) times daily as needed for dizziness. 09/09/16   Copland, Frederico Hamman, MD  NITROSTAT 0.4 MG SL tablet PLACE 1 TABLET (0.4 MG TOTAL) UNDER THE TONGUE EVERY 5 (FIVE) MINUTES AS NEEDED FOR CHEST PAIN. Patient not taking: Reported on 05/30/2017 09/01/15   Burnell Blanks, MD  pantoprazole (PROTONIX) 40 MG tablet Take 1 tablet (40 mg total) by mouth daily. 02/06/17   Biagio Borg, MD  potassium chloride (K-DUR) 10 MEQ tablet Take 1 tablet (10 mEq total) by mouth daily. 04/12/17   Biagio Borg, MD  pramipexole (MIRAPEX) 1 MG tablet  Take 1 tablet (1 mg total) by mouth 3 (three) times daily. 02/06/17   Biagio Borg, MD    Family History Family History  Problem Relation Age of Onset  . Heart attack Father 46       Smoker  . Heart attack Brother 67  . Lung cancer Sister   . Colon cancer Neg Hx     Social History Social History  Substance Use Topics  . Smoking status: Former Smoker    Packs/day: 1.00    Years: 15.00    Types: Cigarettes    Quit date: 02/05/1977  . Smokeless tobacco: Never Used  . Alcohol use 0.5 oz/week    1 Standard drinks or equivalent per week     Comment: RARE     Allergies   Amitriptyline hcl; Diphenhydramine hcl; Simvastatin; and Tylenol [acetaminophen]   Review of Systems Review of Systems  Constitutional: Negative.   HENT: Negative.   Respiratory: Negative.   Gastrointestinal: Negative.   Skin: Positive for wound.  Neurological: Positive for dizziness. Negative for tremors, syncope, facial asymmetry, speech difficulty, weakness, numbness and headaches.       Symptom abated well before coming to urgent care.  Psychiatric/Behavioral: Negative.   All other  systems reviewed and are negative.    Physical Exam Triage Vital Signs ED Triage Vitals [06/07/17 1841]  Enc Vitals Group     BP (!) 151/103     Pulse Rate 66     Resp 18     Temp 97.6 F (36.4 C)     Temp Source Oral     SpO2 100 %     Weight      Height      Head Circumference      Peak Flow      Pain Score 5     Pain Loc      Pain Edu?      Excl. in [place]?    No data found.   Updated Vital Signs BP (!) 151/103 (BP Location: Right Arm)   Pulse 66   Temp 97.6 F (36.4 C) (Oral)   Resp 18   SpO2 100%   Visual Acuity Right Eye Distance:   Left Eye Distance:   Bilateral Distance:    Right Eye Near:   Left Eye Near:    Bilateral Near:     Physical Exam  Constitutional: He is oriented to person, place, and time. He appears well-developed and well-nourished. No distress.  HENT:  Right Ear: External ear normal.  Left Ear: External ear normal.  Patient places his finger, small area to the right frontoparietal scalp where he bumped his head. No marks are readily identifiable. No hematoma or discoloration. No tenderness.  Eyes: Pupils are equal, round, and reactive to light. EOM are normal.  Neck: Normal range of motion. Neck supple.  Cardiovascular: Normal rate and regular rhythm.   Pulmonary/Chest: Effort normal.  Musculoskeletal: Normal range of motion. He exhibits no edema, tenderness or deformity.  Neurological: He is alert and oriented to person, place, and time. No cranial nerve deficit. He exhibits normal muscle tone. Coordination normal.  Skin: Skin is warm and dry.  Left proximal forearm extensor surface with curvilinear skin tear approximately 4 cm in length.  Psychiatric: He has a normal mood and affect.  Nursing note and vitals reviewed.    UC Treatments / Results  Labs (all labs ordered are listed, but only abnormal results are displayed) Labs Reviewed - No data to display  EKG  EKG Interpretation None       Radiology No results  found.  Procedures Procedures (including critical care time)  Medications Ordered in UC Medications  Tdap (BOOSTRIX) injection 0.5 mL (not administered)     Initial Impression / Assessment and Plan / UC Course  I have reviewed the triage vital signs and the nursing notes.  Pertinent labs & imaging results that were available during my care of the patient were reviewed by me and considered in my medical decision making (see chart for details).   procedure note: The wound on the left forearm was irrigated with copious amounts of water. The skin was replaced over the area of the tear. The wound was dried and closed with Steri-Strips. A sterile dressing was then placed on top of the provider.   Keep wound clean and dry. Watch for any signs of infection. Problems with the arm returned. If you develop problems with unusual sleepiness, confusion, disorientation, nausea or vomiting, dizziness or vertigo, problems with vision, speech, weakness and one normal leg, headache or anything unusual go to the emergency department promptly. Instructions given to patient and wife.  Final Clinical Impressions(s) / UC Diagnoses   Final diagnoses:  Fall, initial encounter  Skin tear of forearm without complication, initial encounter    New Prescriptions New Prescriptions   No medications on file     Controlled Substance Prescriptions [place] Controlled Substance Registry consulted? Not Applicable   Janne Napoleon, NP 06/07/17 1949

## 2017-06-08 ENCOUNTER — Observation Stay (HOSPITAL_COMMUNITY)
Admission: EM | Admit: 2017-06-08 | Discharge: 2017-06-10 | Disposition: A | Discharge status: Home / Self Care | Payer: Medicare Other | Attending: Internal Medicine | Admitting: Internal Medicine

## 2017-06-08 ENCOUNTER — Emergency Department (HOSPITAL_COMMUNITY): Payer: Medicare Other

## 2017-06-08 ENCOUNTER — Inpatient Hospital Stay (HOSPITAL_COMMUNITY): Payer: Medicare Other

## 2017-06-08 ENCOUNTER — Encounter (HOSPITAL_COMMUNITY): Payer: Self-pay | Admitting: Emergency Medicine

## 2017-06-08 DIAGNOSIS — H9190 Unspecified hearing loss, unspecified ear: Secondary | ICD-10-CM | POA: Insufficient documentation

## 2017-06-08 DIAGNOSIS — F0390 Unspecified dementia without behavioral disturbance: Secondary | ICD-10-CM | POA: Insufficient documentation

## 2017-06-08 DIAGNOSIS — Z888 Allergy status to other drugs, medicaments and biological substances status: Secondary | ICD-10-CM | POA: Insufficient documentation

## 2017-06-08 DIAGNOSIS — I7389 Other specified peripheral vascular diseases: Secondary | ICD-10-CM | POA: Diagnosis not present

## 2017-06-08 DIAGNOSIS — G2581 Restless legs syndrome: Secondary | ICD-10-CM | POA: Diagnosis not present

## 2017-06-08 DIAGNOSIS — S060X0A Concussion without loss of consciousness, initial encounter: Secondary | ICD-10-CM

## 2017-06-08 DIAGNOSIS — Z886 Allergy status to analgesic agent status: Secondary | ICD-10-CM | POA: Insufficient documentation

## 2017-06-08 DIAGNOSIS — S0990XA Unspecified injury of head, initial encounter: Secondary | ICD-10-CM | POA: Diagnosis not present

## 2017-06-08 DIAGNOSIS — G4710 Hypersomnia, unspecified: Secondary | ICD-10-CM | POA: Insufficient documentation

## 2017-06-08 DIAGNOSIS — Z79899 Other long term (current) drug therapy: Secondary | ICD-10-CM | POA: Diagnosis not present

## 2017-06-08 DIAGNOSIS — I083 Combined rheumatic disorders of mitral, aortic and tricuspid valves: Secondary | ICD-10-CM | POA: Diagnosis not present

## 2017-06-08 DIAGNOSIS — Z801 Family history of malignant neoplasm of trachea, bronchus and lung: Secondary | ICD-10-CM | POA: Insufficient documentation

## 2017-06-08 DIAGNOSIS — K219 Gastro-esophageal reflux disease without esophagitis: Secondary | ICD-10-CM | POA: Diagnosis not present

## 2017-06-08 DIAGNOSIS — E538 Deficiency of other specified B group vitamins: Secondary | ICD-10-CM | POA: Insufficient documentation

## 2017-06-08 DIAGNOSIS — I129 Hypertensive chronic kidney disease with stage 1 through stage 4 chronic kidney disease, or unspecified chronic kidney disease: Secondary | ICD-10-CM | POA: Diagnosis not present

## 2017-06-08 DIAGNOSIS — Z85118 Personal history of other malignant neoplasm of bronchus and lung: Secondary | ICD-10-CM | POA: Diagnosis not present

## 2017-06-08 DIAGNOSIS — Z7982 Long term (current) use of aspirin: Secondary | ICD-10-CM | POA: Diagnosis not present

## 2017-06-08 DIAGNOSIS — Z955 Presence of coronary angioplasty implant and graft: Secondary | ICD-10-CM | POA: Insufficient documentation

## 2017-06-08 DIAGNOSIS — R001 Bradycardia, unspecified: Principal | ICD-10-CM | POA: Diagnosis present

## 2017-06-08 DIAGNOSIS — Z8546 Personal history of malignant neoplasm of prostate: Secondary | ICD-10-CM | POA: Diagnosis not present

## 2017-06-08 DIAGNOSIS — Z8711 Personal history of peptic ulcer disease: Secondary | ICD-10-CM | POA: Insufficient documentation

## 2017-06-08 DIAGNOSIS — N183 Chronic kidney disease, stage 3 unspecified: Secondary | ICD-10-CM | POA: Diagnosis present

## 2017-06-08 DIAGNOSIS — Z87891 Personal history of nicotine dependence: Secondary | ICD-10-CM | POA: Insufficient documentation

## 2017-06-08 DIAGNOSIS — D649 Anemia, unspecified: Secondary | ICD-10-CM | POA: Diagnosis not present

## 2017-06-08 DIAGNOSIS — I739 Peripheral vascular disease, unspecified: Secondary | ICD-10-CM | POA: Diagnosis present

## 2017-06-08 DIAGNOSIS — D631 Anemia in chronic kidney disease: Secondary | ICD-10-CM | POA: Diagnosis not present

## 2017-06-08 DIAGNOSIS — J439 Emphysema, unspecified: Secondary | ICD-10-CM | POA: Diagnosis not present

## 2017-06-08 DIAGNOSIS — I10 Essential (primary) hypertension: Secondary | ICD-10-CM | POA: Diagnosis present

## 2017-06-08 DIAGNOSIS — W010XXA Fall on same level from slipping, tripping and stumbling without subsequent striking against object, initial encounter: Secondary | ICD-10-CM | POA: Insufficient documentation

## 2017-06-08 DIAGNOSIS — I340 Nonrheumatic mitral (valve) insufficiency: Secondary | ICD-10-CM | POA: Diagnosis not present

## 2017-06-08 DIAGNOSIS — Z902 Acquired absence of lung [part of]: Secondary | ICD-10-CM | POA: Insufficient documentation

## 2017-06-08 DIAGNOSIS — I2510 Atherosclerotic heart disease of native coronary artery without angina pectoris: Secondary | ICD-10-CM | POA: Diagnosis not present

## 2017-06-08 DIAGNOSIS — Z8673 Personal history of transient ischemic attack (TIA), and cerebral infarction without residual deficits: Secondary | ICD-10-CM | POA: Insufficient documentation

## 2017-06-08 DIAGNOSIS — W19XXXA Unspecified fall, initial encounter: Secondary | ICD-10-CM

## 2017-06-08 DIAGNOSIS — E785 Hyperlipidemia, unspecified: Secondary | ICD-10-CM | POA: Insufficient documentation

## 2017-06-08 DIAGNOSIS — Z8249 Family history of ischemic heart disease and other diseases of the circulatory system: Secondary | ICD-10-CM | POA: Insufficient documentation

## 2017-06-08 DIAGNOSIS — S0181XA Laceration without foreign body of other part of head, initial encounter: Secondary | ICD-10-CM | POA: Insufficient documentation

## 2017-06-08 DIAGNOSIS — Y939 Activity, unspecified: Secondary | ICD-10-CM | POA: Diagnosis not present

## 2017-06-08 HISTORY — DX: Unspecified dementia, unspecified severity, without behavioral disturbance, psychotic disturbance, mood disturbance, and anxiety: F03.90

## 2017-06-08 LAB — CBC WITH DIFFERENTIAL/PLATELET
BASOS PCT: 0 %
Basophils Absolute: 0 10*3/uL (ref 0.0–0.1)
EOS ABS: 0.1 10*3/uL (ref 0.0–0.7)
Eosinophils Relative: 1 %
HEMATOCRIT: 26.5 % — AB (ref 39.0–52.0)
Hemoglobin: 8.3 g/dL — ABNORMAL LOW (ref 13.0–17.0)
LYMPHS ABS: 1.2 10*3/uL (ref 0.7–4.0)
Lymphocytes Relative: 19 %
MCH: 27.1 pg (ref 26.0–34.0)
MCHC: 31.3 g/dL (ref 30.0–36.0)
MCV: 86.6 fL (ref 78.0–100.0)
MONO ABS: 0.7 10*3/uL (ref 0.1–1.0)
MONOS PCT: 10 %
NEUTROS ABS: 4.3 10*3/uL (ref 1.7–7.7)
Neutrophils Relative %: 69 %
Platelets: 111 10*3/uL — ABNORMAL LOW (ref 150–400)
RBC: 3.06 MIL/uL — ABNORMAL LOW (ref 4.22–5.81)
RDW: 14.7 % (ref 11.5–15.5)
WBC: 6.3 10*3/uL (ref 4.0–10.5)

## 2017-06-08 LAB — BASIC METABOLIC PANEL
Anion gap: 5 (ref 5–15)
BUN: 34 mg/dL — AB (ref 6–20)
CHLORIDE: 107 mmol/L (ref 101–111)
CO2: 25 mmol/L (ref 22–32)
CREATININE: 1.86 mg/dL — AB (ref 0.61–1.24)
Calcium: 8.9 mg/dL (ref 8.9–10.3)
GFR calc Af Amer: 39 mL/min — ABNORMAL LOW (ref >60)
GFR calc non Af Amer: 33 mL/min — ABNORMAL LOW (ref >60)
GLUCOSE: 100 mg/dL — AB (ref 65–99)
POTASSIUM: 4.4 mmol/L (ref 3.5–5.1)
SODIUM: 137 mmol/L (ref 135–145)

## 2017-06-08 LAB — TROPONIN I
Troponin I: <0.03 ng/mL (ref <0.03)
Troponin I: <0.03 ng/mL (ref <0.03)
Troponin I: <0.03 ng/mL (ref <0.03)
Troponin I: <0.03 ng/mL (ref <0.03)

## 2017-06-08 LAB — ECHOCARDIOGRAM COMPLETE
HEIGHTINCHES: 67 in
Weight: 2238.4 oz

## 2017-06-08 LAB — URINALYSIS, ROUTINE W REFLEX MICROSCOPIC
BILIRUBIN URINE: NEGATIVE
Bacteria, UA: NONE SEEN
GLUCOSE, UA: NEGATIVE mg/dL
Hgb urine dipstick: NEGATIVE
KETONES UR: NEGATIVE mg/dL
LEUKOCYTES UA: NEGATIVE
NITRITE: NEGATIVE
PH: 7 (ref 5.0–8.0)
Protein, ur: 100 mg/dL — AB
SPECIFIC GRAVITY, URINE: 1.011 (ref 1.005–1.030)

## 2017-06-08 MED ORDER — HYDRALAZINE HCL 25 MG PO TABS
25.0000 mg | ORAL_TABLET | Freq: Three times a day (TID) | ORAL | Status: DC
  Administered 2017-06-08 – 2017-06-10 (×6): 25 mg via ORAL
  Filled 2017-06-08 (×6): qty 1

## 2017-06-08 MED ORDER — ONDANSETRON HCL 4 MG PO TABS: 4.0000 mg | ORAL_TABLET | Freq: Four times a day (QID) | ORAL | Status: DC | PRN

## 2017-06-08 MED ORDER — ACETAMINOPHEN 650 MG RE SUPP: 650.0000 mg | Freq: Four times a day (QID) | RECTAL | Status: DC | PRN

## 2017-06-08 MED ORDER — AMLODIPINE BESYLATE 5 MG PO TABS
10.0000 mg | ORAL_TABLET | Freq: Every day | ORAL | Status: DC
  Administered 2017-06-08 – 2017-06-10 (×3): 10 mg via ORAL
  Filled 2017-06-08 (×3): qty 2

## 2017-06-08 MED ORDER — LIDOCAINE HCL (PF) 1 % IJ SOLN: 5.0000 mL | Freq: Once | INTRAMUSCULAR | Status: DC

## 2017-06-08 MED ORDER — SODIUM CHLORIDE 0.9% FLUSH: 3.0000 mL | INTRAVENOUS | Status: DC | PRN

## 2017-06-08 MED ORDER — ASPIRIN EC 81 MG PO TBEC
81.0000 mg | DELAYED_RELEASE_TABLET | Freq: Every day | ORAL | Status: DC
Start: 2017-06-08 — End: 2017-06-10
  Administered 2017-06-08 – 2017-06-10 (×3): 81 mg via ORAL
  Filled 2017-06-08 (×3): qty 1

## 2017-06-08 MED ORDER — SENNA 8.6 MG PO TABS
1.0000 | ORAL_TABLET | Freq: Two times a day (BID) | ORAL | Status: DC
  Administered 2017-06-08 – 2017-06-10 (×4): 8.6 mg via ORAL
  Filled 2017-06-08 (×4): qty 1

## 2017-06-08 MED ORDER — TRAZODONE HCL 50 MG PO TABS: 50.0000 mg | ORAL_TABLET | Freq: Every evening | ORAL | Status: DC | PRN

## 2017-06-08 MED ORDER — PRAVASTATIN SODIUM 40 MG PO TABS
20.0000 mg | ORAL_TABLET | Freq: Every day | ORAL | Status: DC
Start: 2017-06-08 — End: 2017-06-10
  Administered 2017-06-08 – 2017-06-09 (×2): 20 mg via ORAL
  Filled 2017-06-08 (×2): qty 1

## 2017-06-08 MED ORDER — SODIUM CHLORIDE 0.9 % IV SOLN: 250.0000 mL | INTRAVENOUS | Status: DC | PRN

## 2017-06-08 MED ORDER — CITALOPRAM HYDROBROMIDE 10 MG PO TABS
40.0000 mg | ORAL_TABLET | Freq: Every day | ORAL | Status: DC
  Administered 2017-06-08 – 2017-06-09 (×2): 40 mg via ORAL
  Filled 2017-06-08 (×2): qty 4

## 2017-06-08 MED ORDER — DONEPEZIL HCL 5 MG PO TABS
5.0000 mg | ORAL_TABLET | Freq: Every day | ORAL | Status: DC
  Administered 2017-06-08 – 2017-06-09 (×2): 5 mg via ORAL
  Filled 2017-06-08 (×2): qty 1

## 2017-06-08 MED ORDER — PANTOPRAZOLE SODIUM 40 MG PO TBEC
40.0000 mg | DELAYED_RELEASE_TABLET | Freq: Every day | ORAL | Status: DC
  Administered 2017-06-08 – 2017-06-10 (×3): 40 mg via ORAL
  Filled 2017-06-08 (×3): qty 1

## 2017-06-08 MED ORDER — CLOPIDOGREL BISULFATE 75 MG PO TABS
75.0000 mg | ORAL_TABLET | Freq: Every day | ORAL | Status: DC
  Administered 2017-06-08 – 2017-06-10 (×3): 75 mg via ORAL
  Filled 2017-06-08 (×3): qty 1

## 2017-06-08 MED ORDER — PRAMIPEXOLE DIHYDROCHLORIDE 0.25 MG PO TABS
0.5000 mg | ORAL_TABLET | Freq: Three times a day (TID) | ORAL | Status: DC
  Administered 2017-06-08 – 2017-06-10 (×6): 0.5 mg via ORAL
  Filled 2017-06-08 (×7): qty 2

## 2017-06-08 MED ORDER — ALBUTEROL SULFATE (2.5 MG/3ML) 0.083% IN NEBU: 2.5000 mg | INHALATION_SOLUTION | RESPIRATORY_TRACT | Status: DC | PRN

## 2017-06-08 MED ORDER — ISOSORBIDE MONONITRATE ER 30 MG PO TB24
30.0000 mg | ORAL_TABLET | Freq: Every day | ORAL | Status: DC
  Administered 2017-06-08 – 2017-06-10 (×3): 30 mg via ORAL
  Filled 2017-06-08 (×3): qty 1

## 2017-06-08 MED ORDER — SODIUM CHLORIDE 0.9% FLUSH
3.0000 mL | Freq: Two times a day (BID) | INTRAVENOUS | Status: DC
  Administered 2017-06-08 – 2017-06-10 (×4): 3 mL via INTRAVENOUS

## 2017-06-08 MED ORDER — ACETAMINOPHEN 325 MG PO TABS: 650.0000 mg | ORAL_TABLET | Freq: Four times a day (QID) | ORAL | Status: DC | PRN

## 2017-06-08 MED ORDER — ONDANSETRON HCL 4 MG/2ML IJ SOLN: 4.0000 mg | Freq: Four times a day (QID) | INTRAMUSCULAR | Status: DC | PRN

## 2017-06-08 MED ORDER — POLYETHYLENE GLYCOL 3350 17 G PO PACK: 17.0000 g | PACK | Freq: Every day | ORAL | Status: DC | PRN

## 2017-06-08 MED ORDER — ACETAMINOPHEN 500 MG PO TABS
1000.0000 mg | ORAL_TABLET | Freq: Once | ORAL | Status: AC
  Administered 2017-06-08: 1000 mg via ORAL
  Filled 2017-06-08: qty 2

## 2017-06-08 MED ORDER — POTASSIUM CHLORIDE ER 10 MEQ PO TBCR
10.0000 meq | EXTENDED_RELEASE_TABLET | Freq: Every day | ORAL | Status: DC
  Administered 2017-06-08 – 2017-06-10 (×3): 10 meq via ORAL
  Filled 2017-06-08 (×6): qty 1

## 2017-06-08 NOTE — Progress Notes (Signed)
Patient taken via bed to Vascular lab for Echo test. Patient and wife updated on plan of care.

## 2017-06-08 NOTE — Consult Note (Signed)
Cardiology Consultation:   Patient ID: Victor Davis; [id number]; June 17, 1941   Admit date: 06/08/2017 Date of Consult: 06/08/2017  Primary Care Provider: Biagio Borg, MD Primary Cardiologist: Dr. Angelena Form    Patient Profile:   Victor Davis is a 76 y.o. male with a hx of CAD, carotid artery disease, CVA, hypertension, hyperlipidemia, lung cancer, as well as a history of prostate cancer, who is being seen today for the evaluation of bradycardia at the request of Dr. Denton Brick, Internal Medicine.  History of Present Illness:   Pt is followed by Dr. Angelena Form.  He has a history of CAD.  He underwent PCI plus drug-eluting stenting to the LAD as well as stenting to the left circumflex in July 2013.  Also known to have carotid artery disease.  Recent carotid duplex April 2018 showed stable 1-39% bilateral ICA disease.  Echocardiogram in 2009 showed normal left ventricular systolic function and no valvular abnormalities.  In 2016, he was evaluated for possible syncopal event.  Cardiac event monitor showed rare PVCs but no other arrhythmias and no high-grade AV block.  Last seen by Dr. Angelena Form in April 2018 and was doing well at that time without any complaints of chest pain.    Pt presented to the [facility] emergency department earlier this morning after sustaining a mechanical fall at home.  Patient notes that he was getting out of bed and tripped over a small box.  Sustained a hematoma with laceration to his left anterior forehead but no other significant injury.  The patient reports that he often is bothered by dizziness however does not recall feeling any dizziness prior to his fall this morning. Based on history, today's fall seems mostly mechanical.  Pt does admit that he has had some lower extremity weakness and gait abnormalities.  Notes that he also frequently has slow heart rates in the 40s and 50s.  He denies any recent chest pain or dyspnea.  No palpitations. No syncope/ LOC. He lives at  home with his wife. Both are fairly independent. He still drives.   ED EKG shows sinus bradycardia with a heart rate in the 40s.  Troponins are negative x2.  CBC is notable anemia with a hemoglobin of 8.3.  Platelet count also low at 111.  BMP shows normal potassium level at 4.4.  Sodium level also normal at 137.  He does have renal insufficiency with serum creatinine at 1.86 and BUN at 34. Up slightly from previous 1 month ago (1.5-1.6)  Glucose 100.  Blood pressure has been mild to moderately elevated in the ED with systolic blood pressures ranging from the 140s-170s. CT of head and neck without evidence of traumatic intracranial injury or fracture.  Patient's meds have been reviewed.  He not on any AV nodal blocking agents however he has been on clonidine for blood pressure and also appears to be on Aricept, which can both cause bradycardia.  Past Medical History:  Diagnosis Date  . BRADYCARDIA 10/24/2010  . CALF PAIN, RIGHT 10/22/2009  . 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 CKD (chronic kidney disease), stage II   . CKD (chronic kidney disease), stage III ([place]) 02/04/2016  . Coronary artery disease     95% followed by 80% mid LAD stenosis, circumflex 80% stenosis. There was a moderate size branching obtuse marginal with 90-95% stenosis in the inferior branch. The right coronary artery had proximal 30% stenosis. Posterior lateral was moderate-sized with 40% stenosis. The EF was 65-70%. He had a DES and  x 2 to the LAD and DES x 2 to the circumflex 03/08/12.    [name] Dysuria 10/10/2010  . Emphysema 04/01/2012   By CT chest, august 2013  . ERECTILE DYSFUNCTION 04/05/2007  . FATIGUE 10/02/2007  . GERD (gastroesophageal reflux disease)   . GLUCOSE INTOLERANCE 07/15/2008  . HYPERLIPIDEMIA 07/30/2008  . HYPERSOMNIA 10/02/2007  . Impaired glucose tolerance 02/09/2011  . INSOMNIA-SLEEP DISORDER-UNSPEC 10/22/2009  . LOW BACK PAIN 04/05/2007  . OSTEOARTHRITIS 04/05/2007  . PEPTIC ULCER DISEASE 04/05/2007  .  PERIPHERAL EDEMA 10/22/2009  . PERIPHERAL VASCULAR DISEASE 07/30/2008  . PROSTATE CANCER, HX OF 04/05/2007  . RESTLESS LEG SYNDROME 05/15/2007  . RHINITIS, ALLERGIC NOS 05/15/2007  . SHOULDER PAIN, LEFT 01/22/2008  . SINUSITIS- ACUTE-NOS 05/15/2007  . Unspecified essential hypertension 04/05/2007  . Unspecified visual loss 07/15/2008  . UTI 10/24/2010    Past Surgical History:  Procedure Laterality Date  . EYE SURGERY     right  . Inguinal herniorrhapy left  2002   x 2  . KNEE SURGERY     left  . Left hip replacement    . LYMPH NODE DISSECTION Right 03/22/2015   Procedure: LYMPH NODE DISSECTION;  Surgeon: Melrose Nakayama, MD;  Location: [place];  Service: Thoracic;  Laterality: Right;  . PERCUTANEOUS CORONARY STENT INTERVENTION (PCI-S) N/A 03/08/2012   Procedure: PERCUTANEOUS CORONARY STENT INTERVENTION (PCI-S);  Surgeon: Burnell Blanks, MD;  Location: [facility] CATH LAB;  Service: Cardiovascular;  Laterality: N/A;  . PROSTATECTOMY  2002   radical  . Right hip replacement  09/22/09  . ROTATOR CUFF REPAIR     left  . s/p ventral surgery  2009  . SEGMENTECOMY Right 03/22/2015   Procedure: RIGHT LOWER LOBE SUPERIOR SEGMENTECTOMY;  Surgeon: Melrose Nakayama, MD;  Location: [place];  Service: Thoracic;  Laterality: Right;  [name] VIDEO ASSISTED THORACOSCOPY Right 03/22/2015   Procedure: RIGHT VIDEO ASSISTED THORACOSCOPY;  Surgeon: Melrose Nakayama, MD;  Location: [place];  Service: Thoracic;  Laterality: Right;     Home Medications:  Prior to Admission medications   Medication Sig Start Date End Date Taking? Authorizing Provider  amLODipine (NORVASC) 10 MG tablet Take 1 tablet (10 mg total) by mouth daily. 02/06/17  Yes Biagio Borg, MD  aspirin 81 MG EC tablet Take 81 mg by mouth daily.     Yes [provider]  citalopram (CELEXA) 40 MG tablet TAKE 1 TABLET BY MOUTH  DAILY Patient taking differently: TAKE 40mg  BY MOUTH once  DAILY 05/21/17  Yes Biagio Borg, MD  cloNIDine (CATAPRES)  0.1 MG tablet TAKE 1 TABLET BY MOUTH IN  THE MORNING THEN TAKE 1  TABLETS IN THE EVENING Patient taking differently: Take 0.1 mg by mouth 2 (two) times daily.  04/11/17  Yes Biagio Borg, MD  clopidogrel (PLAVIX) 75 MG tablet TAKE 1 TABLET BY MOUTH  DAILY Patient taking differently: TAKE 75mg  BY MOUTH once  DAILY 05/21/17  Yes Biagio Borg, MD  donepezil (ARICEPT) 5 MG tablet Take 1 tablet (5 mg total) by mouth at bedtime. 11/12/15  Yes Biagio Borg, MD  gabapentin (NEURONTIN) 100 MG capsule TAKE 1 CAPSULE BY MOUTH TWO TIMES DAILY Patient taking differently: TAKE 100mg  BY MOUTH TWO TIMES DAILY 01/03/17  Yes Biagio Borg, MD  isosorbide mononitrate (IMDUR) 30 MG 24 hr tablet Take 1 tablet (30 mg total) by mouth daily. 02/08/17  Yes Biagio Borg, MD  lovastatin (MEVACOR) 40 MG tablet TAKE 1 TABLET  BY MOUTH AT  BEDTIME Patient taking differently: TAKE 40mg  BY MOUTH AT  BEDTIME 05/21/17  Yes Biagio Borg, MD  pantoprazole (PROTONIX) 40 MG tablet Take 1 tablet (40 mg total) by mouth daily. 02/06/17  Yes Biagio Borg, MD  pramipexole (MIRAPEX) 1 MG tablet Take 1 tablet (1 mg total) by mouth 3 (three) times daily. Patient taking differently: Take 1 mg by mouth 2 (two) times daily.  02/06/17  Yes Biagio Borg, MD  meclizine (ANTIVERT) 25 MG tablet Take 0.5-1 tablets (12.5-25 mg total) by mouth 3 (three) times daily as needed for dizziness. 09/09/16   Cop[place], Frederico Hamman, MD  NITROSTAT 0.4 MG SL tablet PLACE 1 TABLET (0.4 MG TOTAL) UNDER THE TONGUE EVERY 5 (FIVE) MINUTES AS NEEDED FOR CHEST PAIN. 09/01/15   Burnell Blanks, MD  potassium chloride (K-DUR) 10 MEQ tablet Take 1 tablet (10 mEq total) by mouth daily. Patient not taking: Reported on 06/08/2017 04/12/17   Biagio Borg, MD    Inpatient Medications: Scheduled Meds: . amLODipine  10 mg Oral Daily  . aspirin  81 mg Oral Daily  . citalopram  40 mg Oral Daily  . clopidogrel  75 mg Oral Daily  . donepezil  5 mg Oral QHS  . isosorbide  mononitrate  30 mg Oral Daily  . lidocaine (PF)  5 mL Infiltration Once  . pantoprazole  40 mg Oral Daily  . potassium chloride  10 mEq Oral Daily  . pramipexole  0.5 mg Oral TID  . pravastatin  20 mg Oral q1800  . senna  1 tablet Oral BID  . sodium chloride flush  3 mL Intravenous Q12H   Continuous Infusions: . sodium chloride     PRN Meds: sodium chloride, acetaminophen **OR** acetaminophen, albuterol, ondansetron **OR** ondansetron (ZOFRAN) IV, polyethylene glycol, sodium chloride flush, traZODone  Allergies:    Allergies  Allergen Reactions  . Amitriptyline Hcl Other (See Comments)    REACTION: confusion  . Diphenhydramine Hcl Other (See Comments)    Keeps him awake  . Simvastatin Other (See Comments)    REACTION: leg pain  . Tylenol [Acetaminophen] Other (See Comments)    Dr advised pt not to take due to finding a spot on pt's kidney    Social History:   Social History   Social History  . Marital status: Married    Spouse name: N/A  . Number of children: 2  . Years of education: N/A   Occupational History  . truck driver-retired    Social History Main Topics  . Smoking status: Former Smoker    Packs/day: 1.00    Years: 15.00    Types: Cigarettes    Quit date: 02/05/1977  . Smokeless tobacco: Never Used  . Alcohol use 0.5 oz/week    1 Standard drinks or equivalent per week     Comment: RARE  . Drug use: No  . Sexual activity: Not Currently   Other Topics Concern  . Not on file   Social History Narrative  . No narrative on file    Family History:    Family History  Problem Relation Age of Onset  . Heart attack Father 60       Smoker  . Heart attack Brother 39  . Lung cancer Sister   . Colon cancer Neg Hx      ROS:  Please see the history of present illness.  ROS  All other ROS reviewed and negative.     Physical Exam/Data:  Vitals:   06/08/17 0830 06/08/17 0845 06/08/17 0900 06/08/17 1115  BP: (!) 157/75 (!) 157/68 (!) 151/69 (!)  174/74  Pulse: (!) 56 (!) 52 (!) 51 64  Resp: 15 14 18 17   Temp:      TempSrc:      SpO2: 98% 97% 96% 100%    Intake/Output Summary (Last 24 hours) at 06/08/17 1208 Last data filed at 06/08/17 0959  Gross per 24 hour  Intake                0 ml  Output              325 ml  Net             -325 ml   There were no vitals filed for this visit. There is no height or weight on file to calculate BMI.  General:  Elderly and frail appearing, in no acute distress, dentition in poor repair HEENT: hematoma with laceration to his left forehead Lymph: no adenopathy Neck: no JVD Endocrine:  No thryomegaly Vascular: No carotid bruits; FA pulses 2+ bilaterally without bruits  Cardiac:  normal S1, S2; RRR; no murmur  Lungs:  clear to auscultation bilaterally, no wheezing, rhonchi or rales  Abd: soft, nontender, no hepatomegaly  Ext: no edema Musculoskeletal:  No deformities, BUE and BLE strength normal and equal Skin: warm and dry  Neuro:  CNs 2-12 intact, no focal abnormalities noted Psych:  Normal affect   EKG:  The EKG was personally reviewed and demonstrates:  Sinus bradycardia 47 bpm Telemetry:  Telemetry was personally reviewed and demonstrates:  SR/ sinus brady, no high grade AV blocks noted.  Relevant CV Studies: Bilateral Carotid Duplex, 11/2016 1-39% bilateral ICA disease  Echo 2009 - normal LVEF. No valvular abnormalities.   [place] 2013-   Procedure Performed:  1. PTCA/DES x 2 proximal and mid LAD 2. PTCA/DES x 1 mid Circumflex 3. PTCA/DES x 1 proximal Circumflex   Laboratory Data:  Chemistry  Recent Labs Lab 06/08/17 0747  NA 137  K 4.4  CL 107  CO2 25  GLUCOSE 100*  BUN 34*  CREATININE 1.86*  CALCIUM 8.9  GFRNONAA 33*  GFRAA 39*  ANIONGAP 5    No results for input(s): PROT, ALBUMIN, AST, ALT, ALKPHOS, BILITOT in the last 168 hours. Hematology  Recent Labs Lab 06/08/17 0747  WBC 6.3  RBC 3.06*  HGB 8.3*  HCT 26.5*  MCV 86.6  MCH 27.1  MCHC 31.3    RDW 14.7  PLT 111*   Cardiac Enzymes  Recent Labs Lab 06/08/17 0747 06/08/17 1031  TROPONINI <0.03 <0.03   No results for input(s): TROPIPOC in the last 168 hours.  BNPNo results for input(s): BNP, PROBNP in the last 168 hours.  DDimer No results for input(s): DDIMER in the last 168 hours.  Radiology/Studies:  Ct Head Wo Contrast  Result Date: 06/08/2017 CLINICAL DATA:  Status post fall. Hit head, with laceration at the left forehead. Lethargy. Patient on Plavix. Initial encounter. EXAM: CT HEAD WITHOUT CONTRAST TECHNIQUE: Contiguous axial images were obtained from the base of the skull through the vertex without intravenous contrast. COMPARISON:  CT of the head performed 01/04/2017, and MRI of the brain performed 07/22/2008 FINDINGS: Brain: No evidence of acute infarction, hemorrhage, hydrocephalus, extra-axial collection or mass lesion/mass effect. Prominence of the ventricles and sulci reflects moderately severe cortical volume loss. Cerebellar atrophy is noted. Diffuse periventricular and subcortical white matter change likely reflects small vessel ischemic  microangiopathy. Chronic ischemic change is noted at the basal ganglia bilaterally. 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 The brainstem and fourth ventricle are within normal limits. No mass effect or midline shift is seen. Vascular: No hyperdense vessel or unexpected calcification. Skull: There is no evidence of fracture; visualized osseous structures are unremarkable in appearance. Sinuses/Orbits: The orbits are within normal limits. The paranasal sinuses and mastoid air cells are well-aerated. Other: Soft tissue swelling and laceration are noted overlying the high left frontal calvarium. IMPRESSION: 1. No evidence of traumatic intracranial injury or fracture. 2. Soft tissue swelling and laceration overlying the high left frontal calvarium. 3. Moderately severe cortical volume loss and diffuse small vessel  ischemic microangiopathy. 4. Chronic ischemic change at the basal ganglia bilaterally. 5. 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 Electronically Signed   By: Garald Balding M.D.   On: 06/08/2017 06:44    Assessment and Plan:   1. Sinus Bradycardia: ED EKG shows sinus bradycardia, 42 bpm. While his fall earlier today was mechanical (tripped over a box), he notes that he is frequently bothered by dizziness at home. He wore a monitor in 2016 for syncope w/u which showed rare PVCs but no other arrthymias and no high grade AV block. Telemetry in ED currently showing NSR/ sinus brady. BP is stable. K is WNL at 4.4. Recommend also checking TSH. He his not on any AV nodal blocking agents but is has been on clonidine for BP and Aricept, presumably for dementia?[name] Both agents can cause bradycardia. Agree with holding clonidine for now. Will have to adjust antihypertensive regimen to ensure BP is controlled. Continue to monitor on telemetry for bradycardia and to screen AV block/ pauses. He also needs an updated echo. Last echo was in 2009. We will continue to follow along and will provide additional recs after observation on telemetry.    2. Fall: based on description of what happened today, event seems mostly related to mechanical issues. Pt notes h/o balance/ gait problems. Agree that he would benefit from PT. He has had issues with dizziness however, likely due to bradycardia. See recs above.   3. CAD: h/o PCI + DES to the LAD and LCx in 2013. He denies any recent CP. EKG sinus brady. Cardiac enzymes negative x 3.   4. HTN: clonidine is on hold given bradycardia. For this reason we will also need to avoid BBs. He is maxed out on amlodipine, 10 mg. Will need to avoid ACE/ARBs and diuretics given renal insuffiencey. He is on Imdur, 30 mg for CAD. If able to tolerate w/o HAs, can further increase dose. Otherwise, consider addition of hydralazine. Monitor BP.   5. Carotid Artery Disease: followed yearly  by duplex US. Last assessed 11/2016. 1-39% bilateral ICA disease. Continue ASA and statin.   6. H/o CVA: on ASA, Plavix and statin. Keep BP controlled.   7. H/o Lung CA: s/p resection of rt lung adenocarcinoma in 2016.  8. H/o Prostate CA:  7. Anemia: baseline appears to be between 9-10. 8.3 today. Monitor.  8. Thrombocytopenia: platelet count at 111 today. 145 1 month ago. Currently on ASA and Plavix. Monitor. F/u CBC in the am.   9. Renal Insuffiencey: SCr 1.8, up from previous level of 1.5-.1.6. Not on any diuretics, ACE/ARB. Monitor and check orthostatics. He may benefit from light hydration.   MD to follow with further recs. We will continue to follow along with you.    For questions or updates, please  contact [place] Please consult [email or web address] for contact info under Cardiology/STEMI.   Signed, Lyda Jester, PA-C  06/08/2017 12:08 PM   I have seen and examined the patient along with Lyda Jester, PA-C .  I have reviewed the chart, notes and new data.  I agree with PA's note.  Key new complaints: Has had multiple falls, none of them associated with loss of consciousness or preceded by dizziness.  He believes today's fall happened because his heel caught on a box that was not fully pushed underneath his bed.  Another recent fall happened when he was going down some slick wooden steps. Key examination changes: Frontal laceration, no lateralizing neurological signs.  Bradycardic and very faint aortic ejection murmur (early peaking), otherwise normal cardiac exam Key new findings / data: Creatinine mildly elevated above baseline of moderate chronic kidney disease.  EKG shows sinus bradycardia but is otherwise unremarkable.  PLAN: Stop clonidine.  Can add low-dose hydralazine although this can also cause orthostatic hypotension.  Ideally, once renal function improves he can receive a small amount of ARB or ACE inhibitor.  Continue amlodipine.  Judging by the  information provided by his wife, Aricept has been beneficial and should be continued. I do not think his falls are syncopal events.  I do not think a pacemaker would help prevent them from happening. Also needs evaluation for anemia.  Normocytic, normochromic pattern and mild thrombocytopenia might suggest bone marrow abnormality, but labs from late August showed findings consistent with iron deficiency.  Suspect there is a combination of both.  Sanda Klein, MD, [place] [phone] 06/08/2017, 1:26 PM

## 2017-06-08 NOTE — Progress Notes (Signed)
Patient admitted to room 3E20 and wife at bedside.  Both oriented to room.  Patient denies pain.  Patient has had multiple recent falls and fell today.  Wife stated he fell on cup in floor and it broke and that is what caused cut to his forehead.  Gauze intact and when area assessed, he has 8 stitches (ER nurse reported they were placed today in ER) and edges well-approximated.  ER nurse reported skin tear to left arm was taken care of at Urgent Care facility yesterday.  Upon assessment of this wound, steir strips are intact and at varying angles, holding skin together.  No active bleeding from site.  Patient denies pain.

## 2017-06-08 NOTE — Evaluation (Signed)
Physical Therapy Evaluation Patient Details Name: Victor Davis MRN: [id number] DOB: May 31, 1941 Today's Date: 06/08/2017   History of Present Illness  Pt is a 76 y/o male admitted secondary to falls at home that were mechanical in nature. PMH including but not limited to PAD, PVD, CKD, HTN, bradycardia.  Clinical Impression  Pt presented supine in bed with HOB elevated, awake and willing to participate in therapy session. Prior to admission, pt reported he was independent with all functional mobility and ADLs. Pt reported that he continues to drive. Pt ambulated in hallway with min guard for safety without use of an AD. Pt with mild to modest instability but no overt LOB or need for physical assistance. Pt's starting HR was 63 bpm and after ambulation was 74 bpm. Pt would continue to benefit from skilled physical therapy services at this time while admitted and after d/c to address the below listed limitations in order to improve overall safety and independence with functional mobility.     Follow Up Recommendations Home health PT    Equipment Recommendations  None recommended by PT    Recommendations for Other Services       Precautions / Restrictions Precautions Precautions: Fall Restrictions Weight Bearing Restrictions: No      Mobility  Bed Mobility Overal bed mobility: Modified Independent             General bed mobility comments: increased time and effort  Transfers Overall transfer level: Needs assistance Equipment used: None Transfers: Sit to/from Stand Sit to Stand: Min guard         General transfer comment: increased time, min guard for safety  Ambulation/Gait Ambulation/Gait assistance: Min guard Ambulation Distance (Feet): 100 Feet Assistive device: None Gait Pattern/deviations: Step-through pattern;Decreased step length - right;Decreased step length - left;Decreased stride length;Shuffle Gait velocity: decreased Gait velocity interpretation:  Below normal speed for age/gender General Gait Details: mild instability but no overt LOB or need for physical assistance, min guard for safety  Stairs            Wheelchair Mobility    Modified Rankin (Stroke Patients Only)       Balance Overall balance assessment: History of Falls;Needs assistance Sitting-balance support: Feet supported Sitting balance-Leahy Scale: Good     Standing balance support: During functional activity;No upper extremity supported Standing balance-Leahy Scale: Fair                               Pertinent Vitals/Pain Pain Assessment: No/denies pain    Home Living Family/patient expects to be discharged to:: Private residence Living Arrangements: Spouse/significant other Available Help at Discharge: Family Type of Home: House Home Access: Stairs to enter Entrance Stairs-Rails: Right Entrance Stairs-Number of Steps: 3 Home Layout: One level Home Equipment: Marine scientist - single point      Prior Function Level of Independence: Independent         Comments: pt continues to drive     Hand Dominance        Extremity/Trunk Assessment   Upper Extremity Assessment Upper Extremity Assessment: Generalized weakness    Lower Extremity Assessment Lower Extremity Assessment: Overall WFL for tasks assessed       Communication   Communication: No difficulties  Cognition Arousal/Alertness: Awake/alert Behavior During Therapy: WFL for tasks assessed/performed Overall Cognitive Status: Within Functional Limits for tasks assessed  General Comments: cognition not formally assessed but WNL for general conversation      General Comments      Exercises     Assessment/Plan    PT Assessment Patient needs continued PT services  PT Problem List Decreased balance;Decreased mobility;Decreased coordination       PT Treatment Interventions DME instruction;Gait  training;Functional mobility training;Stair training;Therapeutic activities;Balance training;Therapeutic exercise;Neuromuscular re-education;Patient/family education    PT Goals (Current goals can be found in the Care Plan section)  Acute Rehab PT Goals Patient Stated Goal: return home PT Goal Formulation: With patient Time For Goal Achievement: 06/22/17 Potential to Achieve Goals: Good    Frequency Min 3X/week   Barriers to discharge        Co-evaluation               AM-PAC PT "6 Clicks" Daily Activity  Outcome Measure Difficulty turning over in bed (including adjusting bedclothes, sheets and blankets)?: A Little Difficulty moving from lying on back to sitting on the side of the bed? : A Little Difficulty sitting down on and standing up from a chair with arms (e.g., wheelchair, bedside commode, etc,.)?: A Little Help needed moving to and from a bed to chair (including a wheelchair)?: A Little Help needed walking in hospital room?: A Little Help needed climbing 3-5 steps with a railing? : A Little 6 Click Score: 18    End of Session Equipment Utilized During Treatment: Gait belt Activity Tolerance: Patient tolerated treatment well Patient left: in bed;with call bell/phone within reach Nurse Communication: Mobility status PT Visit Diagnosis: Unsteadiness on feet (R26.81)    Time: 3354-5625 PT Time Calculation (min) (ACUTE ONLY): 15 min   Charges:   PT Evaluation $PT Eval Moderate Complexity: 1 Mod     PT G Codes:        [place], PT, DPT [place] 06/08/2017, 5:34 PM

## 2017-06-08 NOTE — ED Notes (Signed)
Patient transported to CT 

## 2017-06-08 NOTE — ED Triage Notes (Addendum)
Patient fell and hit head this am after getting out of bed.  He hit his head on the floor and hit a coffee cup[ that was on the floor and has a laceration to the left forehead.  Patient is sleepy in triage and he is on Plavix.  Patient is able to tell me where he is, who he is but not the date, wife states he would know the date.  Patient is sleepy and falling asleep in triage.

## 2017-06-08 NOTE — H&P (Signed)
Patient Demographics:    Victor Davis, is a 76 y.o. male  MRN: [id number]   DOB - [birth year]  Admit Date - 06/08/2017  Outpatient Primary MD for the patient is Biagio Borg, MD   Assessment & Plan:    Principal Problem:   Bradycardia/Falls Active Problems:   Peripheral vascular disease ([place])   CAD (coronary artery disease)   Hypertension   CKD (chronic kidney disease), stage III ([place])   Head injury without skull fracture/S/p Fall with Lt Frontal Laceration   Symptomatic sinus bradycardia    1)Symptomatic Bradycardia- with recurrent falls, he complains of dizziness when his heart rate drops into the 40s on the ED monitor, we will stop clonidine, check orthostatic vitals, and get cardiology consult/input (Card Master notified), echocardiogram pending. Serial troponins also pending  2)Recurrent Falls- 3 falls in the last 5 days, fall precautions, check serial troponins, watch for further arrhythmias on telemetry monitored unit, get PT/ OT eval  3)Acute on chronic anemia- suspect some of patient's chronic anemia is secondary to CKD and  chronic dual antiplatelet therapy, recent hemoglobin baseline around 10 today hemoglobin is down to 8.3 query due to acute blood loss as a result of head laceration, monitor H&H, transfuse as clinically appropriate. ??? If candidate for Epogen  4)CAD/HTN- no chest pains or frank evidence of ACS at this time, patient with prior stents, orthostatic vitals pending, okay to continue amlodipine 10 mg daily, isosorbide 30 mg daily, c/n asa/plavix and statin pending cardiology eval/input. No betablockers due to bradycardia  5)CKD 3- baseline GFR in the low 30s, renal function appears to be close to patient's recent baseline at this time, avoid nephrotoxic agents  6)H/o [place] Lung Ca and  Prostate Ca- sees Oncology (Dr Julien Nordmann) and Urology  With History of - Reviewed by me  Stage IA (T1a, N0, M0) non-small cell lung cancer, well-differentiated adenocarcinoma diagnosed in July 2016. 2) history of prostate adenocarcinoma managed by Dr. Tresa Moore at the [facility]  PRIOR THERAPY: Status post right lower lobe superior segmentectomy with lymph node sampling under the care of Dr. Roxan Hockey on 03/22/2015.   Past Medical History:  Diagnosis Date  . BRADYCARDIA 10/24/2010  . CALF PAIN, RIGHT 10/22/2009  . 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 CKD (chronic kidney disease), stage II   . CKD (chronic kidney disease), stage III ([place]) 02/04/2016  . Coronary artery disease     95% followed by 80% mid LAD stenosis, circumflex 80% stenosis. There was a moderate size branching obtuse marginal with 90-95% stenosis in the inferior branch. The right coronary artery had proximal 30% stenosis. Posterior lateral was moderate-sized with 40% stenosis. The EF was 65-70%. He had a DES and  x 2 to the LAD and DES x 2 to the circumflex 03/08/12.    [name] Dysuria 10/10/2010  . Emphysema 04/01/2012   By CT chest, august 2013  . ERECTILE DYSFUNCTION 04/05/2007  . FATIGUE 10/02/2007  . GERD (gastroesophageal  reflux disease)   . GLUCOSE INTOLERANCE 07/15/2008  . HYPERLIPIDEMIA 07/30/2008  . HYPERSOMNIA 10/02/2007  . Impaired glucose tolerance 02/09/2011  . INSOMNIA-SLEEP DISORDER-UNSPEC 10/22/2009  . LOW BACK PAIN 04/05/2007  . OSTEOARTHRITIS 04/05/2007  . PEPTIC ULCER DISEASE 04/05/2007  . PERIPHERAL EDEMA 10/22/2009  . PERIPHERAL VASCULAR DISEASE 07/30/2008  . PROSTATE CANCER, HX OF 04/05/2007  . RESTLESS LEG SYNDROME 05/15/2007  . RHINITIS, ALLERGIC NOS 05/15/2007  . SHOULDER PAIN, LEFT 01/22/2008  . SINUSITIS- ACUTE-NOS 05/15/2007  . Unspecified essential hypertension 04/05/2007  . Unspecified visual loss 07/15/2008  . UTI 10/24/2010      Past Surgical History:  Procedure Laterality Date  . EYE SURGERY       right  . Inguinal herniorrhapy left  2002   x 2  . KNEE SURGERY     left  . Left hip replacement    . LYMPH NODE DISSECTION Right 03/22/2015   Procedure: LYMPH NODE DISSECTION;  Surgeon: Melrose Nakayama, MD;  Location: [facility];  Service: Thoracic;  Laterality: Right;  . PERCUTANEOUS CORONARY STENT INTERVENTION (PCI-S) N/A 03/08/2012   Procedure: PERCUTANEOUS CORONARY STENT INTERVENTION (PCI-S);  Surgeon: Burnell Blanks, MD;  Location: [facility] CATH LAB;  Service: Cardiovascular;  Laterality: N/A;  . PROSTATECTOMY  2002   radical  . Right hip replacement  09/22/09  . ROTATOR CUFF REPAIR     left  . s/p ventral surgery  2009  . SEGMENTECOMY Right 03/22/2015   Procedure: RIGHT LOWER LOBE SUPERIOR SEGMENTECTOMY;  Surgeon: Melrose Nakayama, MD;  Location: [facility];  Service: Thoracic;  Laterality: Right;  [name] VIDEO ASSISTED THORACOSCOPY Right 03/22/2015   Procedure: RIGHT VIDEO ASSISTED THORACOSCOPY;  Surgeon: Melrose Nakayama, MD;  Location: [facility];  Service: Thoracic;  Laterality: Right;      Chief Complaint  Patient presents with  . Fall      HPI:    Victor Davis  is a 76 y.o. male, with past medical history relevant for security, anemia and coronary artery disease with previous angioplasty and stent placement to his currently on Plavix/aspirin as well as history of dementia who presents to the ED after presumed mechanical fall at home. Patient and his wife denied loss of consciousness, denies frank syncope.  Patient apparently was trying to get out of bed and stumbled over a box that was sticking out from underneath the bed, his wife heard him fall , he landed face forward his forehead hit a coffee mug  smashing the coffee mug and sustaining laceration to the left frontal area according to patient's wife does a lot of blood on the floor from the fall. Patient endorses recurrent episodes of dizziness , He has fallen 3 times this week already.   At baseline pt is at times forgetful  with some cognitive concerns, wife states his current cognitive status is not a whole lot different from his "baseline"  In the ED..... Patient is found to be bradycardic with heart rate in the 40s, he complains of dizziness when his heart rate drops into the 40s on the ED monitor. No frank chest pains or palpitations. ED provider repaired left frontal scalp area laceration.   Patients cognitive deficits limits reliability of his story, Additional history obtained from patient's wife at bedside.   No leg pains no leg swelling no pleuritic symptoms      Review of systems:    In addition to the HPI above,   A full 12 point Review of 10 Systems was  done, except as stated above, all other Review of 10 Systems were negative.    Social History:  Reviewed by me    Social History  Substance Use Topics  . Smoking status: Former Smoker    Packs/day: 1.00    Years: 15.00    Types: Cigarettes    Quit date: 02/05/1977  . Smokeless tobacco: Never Used  . Alcohol use 0.5 oz/week    1 Standard drinks or equivalent per week     Comment: RARE       Family History :  Reviewed by me    Family History  Problem Relation Age of Onset  . Heart attack Father 15       Smoker  . Heart attack Brother 82  . Lung cancer Sister   . Colon cancer Neg Hx      Home Medications:   Prior to Admission medications   Medication Sig Start Date End Date Taking? Authorizing Provider  amLODipine (NORVASC) 10 MG tablet Take 1 tablet (10 mg total) by mouth daily. 02/06/17   Biagio Borg, MD  aspirin 81 MG EC tablet Take 81 mg by mouth daily.      [provider]  citalopram (CELEXA) 40 MG tablet TAKE 1 TABLET BY MOUTH  DAILY 05/21/17   Biagio Borg, MD  cloNIDine (CATAPRES) 0.1 MG tablet TAKE 1 TABLET BY MOUTH IN  THE MORNING THEN TAKE 1  TABLETS IN THE EVENING 04/11/17   Biagio Borg, MD  clopidogrel (PLAVIX) 75 MG tablet TAKE 1 TABLET BY MOUTH  DAILY 05/21/17   Biagio Borg, MD  donepezil  (ARICEPT) 5 MG tablet Take 1 tablet (5 mg total) by mouth at bedtime. 11/12/15   Biagio Borg, MD  gabapentin (NEURONTIN) 100 MG capsule TAKE 1 CAPSULE BY MOUTH TWO TIMES DAILY 01/03/17   Biagio Borg, MD  isosorbide mononitrate (IMDUR) 30 MG 24 hr tablet Take 1 tablet (30 mg total) by mouth daily. 02/08/17   Biagio Borg, MD  lovastatin (MEVACOR) 40 MG tablet TAKE 1 TABLET BY MOUTH AT  BEDTIME 05/21/17   Biagio Borg, MD  meclizine (ANTIVERT) 25 MG tablet Take 0.5-1 tablets (12.5-25 mg total) by mouth 3 (three) times daily as needed for dizziness. 09/09/16   Copland, Frederico Hamman, MD  NITROSTAT 0.4 MG SL tablet PLACE 1 TABLET (0.4 MG TOTAL) UNDER THE TONGUE EVERY 5 (FIVE) MINUTES AS NEEDED FOR CHEST PAIN. Patient not taking: Reported on 05/30/2017 09/01/15   Burnell Blanks, MD  pantoprazole (PROTONIX) 40 MG tablet Take 1 tablet (40 mg total) by mouth daily. 02/06/17   Biagio Borg, MD  potassium chloride (K-DUR) 10 MEQ tablet Take 1 tablet (10 mEq total) by mouth daily. 04/12/17   Biagio Borg, MD  pramipexole (MIRAPEX) 1 MG tablet Take 1 tablet (1 mg total) by mouth 3 (three) times daily. 02/06/17   Biagio Borg, MD     Allergies:     Allergies  Allergen Reactions  . Amitriptyline Hcl Other (See Comments)    REACTION: confusion  . Diphenhydramine Hcl Other (See Comments)    Keeps him awake  . Simvastatin Other (See Comments)    REACTION: leg pain  . Tylenol [Acetaminophen] Other (See Comments)    Dr advised pt not to take due to finding a spot on pt's kidney     Physical Exam:   Vitals  Blood pressure (!) 151/69, pulse (!) 51, temperature 97.6 F (36.4 C), temperature  source Oral, resp. rate 18, SpO2 96 %.  Physical Examination: General appearance - alert, well appearing, and in no distress  Mental status - alert, oriented to person, place, and time,  Head- Lt frontal/forehead scalp area laceration Eyes - sclera anicteric Neck - supple, no JVD elevation , Chest - clear  to  auscultation bilaterally, symmetrical air movement,  Heart - S1 and S2 normal, Bradycardic , HR 46 Abdomen - soft, nontender, nondistended, no CVA tenderness Neurological - at times forgetful,  neck supple without rigidity, cranial nerves II through XII intact, DTR's normal and symmetric Extremities - no pedal edema noted, intact peripheral pulses  Skin - warm, dry Psych- At baseline pt is at times forgetful with some cognitive concerns, wife states his current cognitive status is not a whole lot different from his "baseline"   Data Review:    CBC  Recent Labs Lab 06/08/17 0747  WBC 6.3  HGB 8.3*  HCT 26.5*  PLT 111*  MCV 86.6  MCH 27.1  MCHC 31.3  RDW 14.7  LYMPHSABS 1.2  MONOABS 0.7  EOSABS 0.1  BASOSABS 0.0   ------------------------------------------------------------------------------------------------------------------  Chemistries   Recent Labs Lab 06/08/17 0747  NA 137  K 4.4  CL 107  CO2 25  GLUCOSE 100*  BUN 34*  CREATININE 1.86*  CALCIUM 8.9   ------------------------------------------------------------------------------------------------------------------ estimated creatinine clearance is 30.1 mL/min (A) (by C-G formula based on SCr of 1.86 mg/dL (H)). ------------------------------------------------------------------------------------------------------------------ No results for input(s): TSH, T4TOTAL, T3FREE, THYROIDAB in the last 72 hours.  Invalid input(s): FREET3   Coagulation profile No results for input(s): INR, PROTIME in the last 168 hours. ------------------------------------------------------------------------------------------------------------------- No results for input(s): DDIMER in the last 72 hours. -------------------------------------------------------------------------------------------------------------------  Cardiac Enzymes  Recent Labs Lab 06/08/17 0747  TROPONINI <0.03    ------------------------------------------------------------------------------------------------------------------ No results found for: BNP   ---------------------------------------------------------------------------------------------------------------  Urinalysis    Component Value Date/Time   COLORURINE STRAW (A) 06/08/2017 0800   APPEARANCEUR CLEAR 06/08/2017 0800   LABSPEC 1.011 06/08/2017 0800   PHURINE 7.0 06/08/2017 0800   GLUCOSEU NEGATIVE 06/08/2017 0800   GLUCOSEU NEGATIVE 04/11/2017 1013   HGBUR NEGATIVE 06/08/2017 0800   BILIRUBINUR NEGATIVE 06/08/2017 0800   KETONESUR NEGATIVE 06/08/2017 0800   PROTEINUR 100 (A) 06/08/2017 0800   UROBILINOGEN 0.2 04/11/2017 1013   NITRITE NEGATIVE 06/08/2017 0800   LEUKOCYTESUR NEGATIVE 06/08/2017 0800    ----------------------------------------------------------------------------------------------------------------   Imaging Results:    Ct Head Wo Contrast  Result Date: 06/08/2017 CLINICAL DATA:  Status post fall. Hit head, with laceration at the left forehead. Lethargy. Patient on Plavix. Initial encounter. EXAM: CT HEAD WITHOUT CONTRAST TECHNIQUE: Contiguous axial images were obtained from the base of the skull through the vertex without intravenous contrast. COMPARISON:  CT of the head performed 01/04/2017, and MRI of the brain performed 07/22/2008 FINDINGS: Brain: No evidence of acute infarction, hemorrhage, hydrocephalus, extra-axial collection or mass lesion/mass effect. Prominence of the ventricles and sulci reflects moderately severe cortical volume loss. Cerebellar atrophy is noted. Diffuse periventricular and subcortical white matter change likely reflects small vessel ischemic microangiopathy. Chronic ischemic change is noted at the basal ganglia bilaterally. 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 The brainstem and fourth ventricle are within normal limits. No mass effect or midline shift is seen.  Vascular: No hyperdense vessel or unexpected calcification. Skull: There is no evidence of fracture; visualized osseous structures are unremarkable in appearance. Sinuses/Orbits: The orbits are within normal limits. The paranasal sinuses and mastoid air cells are well-aerated.  Other: Soft tissue swelling and laceration are noted overlying the high left frontal calvarium. IMPRESSION: 1. No evidence of traumatic intracranial injury or fracture. 2. Soft tissue swelling and laceration overlying the high left frontal calvarium. 3. Moderately severe cortical volume loss and diffuse small vessel ischemic microangiopathy. 4. Chronic ischemic change at the basal ganglia bilaterally. 5. 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 Electronically Signed   By: Garald Balding M.D.   On: 06/08/2017 06:44    Radiological Exams on Admission: Ct Head Wo Contrast  Result Date: 06/08/2017 CLINICAL DATA:  Status post fall. Hit head, with laceration at the left forehead. Lethargy. Patient on Plavix. Initial encounter. EXAM: CT HEAD WITHOUT CONTRAST TECHNIQUE: Contiguous axial images were obtained from the base of the skull through the vertex without intravenous contrast. COMPARISON:  CT of the head performed 01/04/2017, and MRI of the brain performed 07/22/2008 FINDINGS: Brain: No evidence of acute infarction, hemorrhage, hydrocephalus, extra-axial collection or mass lesion/mass effect. Prominence of the ventricles and sulci reflects moderately severe cortical volume loss. Cerebellar atrophy is noted. Diffuse periventricular and subcortical white matter change likely reflects small vessel ischemic microangiopathy. Chronic ischemic change is noted at the basal ganglia bilaterally. 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 The brainstem and fourth ventricle are within normal limits. No mass effect or midline shift is seen. Vascular: No hyperdense vessel or unexpected calcification. Skull: There is no  evidence of fracture; visualized osseous structures are unremarkable in appearance. Sinuses/Orbits: The orbits are within normal limits. The paranasal sinuses and mastoid air cells are well-aerated. Other: Soft tissue swelling and laceration are noted overlying the high left frontal calvarium. IMPRESSION: 1. No evidence of traumatic intracranial injury or fracture. 2. Soft tissue swelling and laceration overlying the high left frontal calvarium. 3. Moderately severe cortical volume loss and diffuse small vessel ischemic microangiopathy. 4. Chronic ischemic change at the basal ganglia bilaterally. 5. 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 Electronically Signed   By: Garald Balding M.D.   On: 06/08/2017 06:44    DVT Prophylaxis -SCD   AM Labs Ordered, also please review Full Orders  Family Communication: Admission, patients condition and plan of care including tests being ordered have been discussed with the patient and wife who indicate understanding and agree with the plan   Code Status - Full Code  Likely DC to  Home ??? With Home Health PT  Condition   Stable  Coralynn Gaona M.D on 06/08/2017 at 10:21 AM   Between 7am to 7pm - Pager - [phone] After 7pm go to [email or web address] - password [email or web address]  [facility] - Office  [phone]  Voice Recognition Viviann Spare dictation system was used to create this note, attempts have been made to correct errors. Please contact the author with questions and/or clarifications.

## 2017-06-08 NOTE — ED Notes (Signed)
Patient is resting with family at bedside with call bell in reach

## 2017-06-08 NOTE — ED Provider Notes (Addendum)
[facility] DEPART[facility] Provider Note   CSN: [id number] Arrival date & time: 06/08/17  0556     History   Chief Complaint Chief Complaint  Patient presents with  . Fall    HPI Victor Davis is a 76 y.o. male.  HPI Patient presents after a fall.  Was getting out of bed when he tripped over a coffee cup and fell hitting his left forehead.  Has had some mild confusion since.  Does have occasional memory issues and patient has not been able to remember the month.  Patient's wife has said this is not that unusual for him but usually he would know the date.  The patient is able to identify his wife.  States he just tripped and went down.  Complaining of mild pain in the right forearm where he had a fall yesterday.  Seen at urgent care for that and had tetanus updated.  Has hematoma with laceration to his left forehead.  Reported was a large amount of bleeding at home.  Patient has been feeling somewhat dizzy over the last weeks.  Patient's wife thinks that he stood up too fast today.  Has a history of bradycardia but patient's wife says they are just watching it and said that nothing has been done for it.  Has had good oral intake.  No fevers or chills. Past Medical History:  Diagnosis Date  . BRADYCARDIA 10/24/2010  . CALF PAIN, RIGHT 10/22/2009  . 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 CKD (chronic kidney disease), stage II   . CKD (chronic kidney disease), stage III ([place]) 02/04/2016  . Coronary artery disease     95% followed by 80% mid LAD stenosis, circumflex 80% stenosis. There was a moderate size branching obtuse marginal with 90-95% stenosis in the inferior branch. The right coronary artery had proximal 30% stenosis. Posterior lateral was moderate-sized with 40% stenosis. The EF was 65-70%. He had a DES and  x 2 to the LAD and DES x 2 to the circumflex 03/08/12.    [name] Dysuria 10/10/2010  . Emphysema 04/01/2012   By CT chest, august 2013  . ERECTILE DYSFUNCTION  04/05/2007  . FATIGUE 10/02/2007  . GERD (gastroesophageal reflux disease)   . GLUCOSE INTOLERANCE 07/15/2008  . HYPERLIPIDEMIA 07/30/2008  . HYPERSOMNIA 10/02/2007  . Impaired glucose tolerance 02/09/2011  . INSOMNIA-SLEEP DISORDER-UNSPEC 10/22/2009  . LOW BACK PAIN 04/05/2007  . OSTEOARTHRITIS 04/05/2007  . PEPTIC ULCER DISEASE 04/05/2007  . PERIPHERAL EDEMA 10/22/2009  . PERIPHERAL VASCULAR DISEASE 07/30/2008  . PROSTATE CANCER, HX OF 04/05/2007  . RESTLESS LEG SYNDROME 05/15/2007  . RHINITIS, ALLERGIC NOS 05/15/2007  . SHOULDER PAIN, LEFT 01/22/2008  . SINUSITIS- ACUTE-NOS 05/15/2007  . Unspecified essential hypertension 04/05/2007  . Unspecified visual loss 07/15/2008  . UTI 10/24/2010    Patient Active Problem List   Diagnosis Date Noted  . B12 deficiency 04/11/2017  . CKD (chronic kidney disease), stage III ([place]) 02/04/2016  . Snoring 01/21/2016  . Mild cognitive impairment 10/08/2015  . Multiple bruises 10/08/2015  . Non-small cell carcinoma of right lung, stage 1 ([place]) 04/29/2015  . Lung nodule 03/22/2015  . Cholecystostomy drain infection ([place]) 01/25/2015  . Abdominal pain 01/18/2015  . Cholecystitis 01/18/2015  . Acute on chronic kidney failure ([place]) 01/18/2015  . Right upper quadrant pain   . Acute cholecystitis   . Renal cyst 12/31/2012  . Hearing loss 07/05/2012  . Lesion of nose 07/05/2012  . Anemia, unspecified 04/02/2012  . Pulmonary emphysema (  [place]) 04/01/2012  . Hypertension 03/09/2012  . Pulmonary nodule, right 02/08/2012  . Mitral regurgitation 02/06/2012  . CAD (coronary artery disease) 01/04/2012  . Unspecified disorder of liver 12/18/2011  . Hyperthyroidism 12/18/2011  . Nonspecific abnormal findings on radiological and examination of lung field 12/18/2011  . Impaired glucose tolerance 02/09/2011  . Encounter for well adult exam with abnormal findings 02/09/2011  . BRADYCARDIA 10/24/2010  . INSOMNIA-SLEEP DISORDER-UNSPEC 10/22/2009  . PERIPHERAL EDEMA  10/22/2009  . HLD (hyperlipidemia) 07/30/2008  . Peripheral vascular disease ([place]) 07/30/2008  . 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 Unspecified visual loss 07/15/2008  . SHOULDER PAIN, LEFT 01/22/2008  . Hypersomnia 10/02/2007  . RESTLESS LEG SYNDROME 05/15/2007  . RHINITIS, ALLERGIC NOS 05/15/2007  . ERECTILE DYSFUNCTION 04/05/2007  . PEPTIC ULCER DISEASE 04/05/2007  . Osteoarthritis 04/05/2007  . LOW BACK PAIN 04/05/2007  . PROSTATE CANCER, HX OF 04/05/2007    Past Surgical History:  Procedure Laterality Date  . EYE SURGERY     right  . Inguinal herniorrhapy left  2002   x 2  . KNEE SURGERY     left  . Left hip replacement    . LYMPH NODE DISSECTION Right 03/22/2015   Procedure: LYMPH NODE DISSECTION;  Surgeon: Melrose Nakayama, MD;  Location: [facility];  Service: Thoracic;  Laterality: Right;  . PERCUTANEOUS CORONARY STENT INTERVENTION (PCI-S) N/A 03/08/2012   Procedure: PERCUTANEOUS CORONARY STENT INTERVENTION (PCI-S);  Surgeon: Burnell Blanks, MD;  Location: [facility] CATH LAB;  Service: Cardiovascular;  Laterality: N/A;  . PROSTATECTOMY  2002   radical  . Right hip replacement  09/22/09  . ROTATOR CUFF REPAIR     left  . s/p ventral surgery  2009  . SEGMENTECOMY Right 03/22/2015   Procedure: RIGHT LOWER LOBE SUPERIOR SEGMENTECTOMY;  Surgeon: Melrose Nakayama, MD;  Location: [facility];  Service: Thoracic;  Laterality: Right;  [name] VIDEO ASSISTED THORACOSCOPY Right 03/22/2015   Procedure: RIGHT VIDEO ASSISTED THORACOSCOPY;  Surgeon: Melrose Nakayama, MD;  Location: [facility];  Service: Thoracic;  Laterality: Right;       Home Medications    Prior to Admission medications   Medication Sig Start Date End Date Taking? Authorizing Provider  amLODipine (NORVASC) 10 MG tablet Take 1 tablet (10 mg total) by mouth daily. 02/06/17   Biagio Borg, MD  aspirin 81 MG EC tablet Take 81 mg by mouth daily.      [provider]  citalopram (CELEXA) 40 MG tablet TAKE 1 TABLET  BY MOUTH  DAILY 05/21/17   Biagio Borg, MD  cloNIDine (CATAPRES) 0.1 MG tablet TAKE 1 TABLET BY MOUTH IN  THE MORNING THEN TAKE 1  TABLETS IN THE EVENING 04/11/17   Biagio Borg, MD  clopidogrel (PLAVIX) 75 MG tablet TAKE 1 TABLET BY MOUTH  DAILY 05/21/17   Biagio Borg, MD  donepezil (ARICEPT) 5 MG tablet Take 1 tablet (5 mg total) by mouth at bedtime. 11/12/15   Biagio Borg, MD  gabapentin (NEURONTIN) 100 MG capsule TAKE 1 CAPSULE BY MOUTH TWO TIMES DAILY 01/03/17   Biagio Borg, MD  isosorbide mononitrate (IMDUR) 30 MG 24 hr tablet Take 1 tablet (30 mg total) by mouth daily. 02/08/17   Biagio Borg, MD  lovastatin (MEVACOR) 40 MG tablet TAKE 1 TABLET BY MOUTH AT  BEDTIME 05/21/17   Biagio Borg, MD  meclizine (ANTIVERT) 25 MG tablet Take 0.5-1 tablets (12.5-25 mg total) by mouth 3 (three) times daily  as needed for dizziness. 09/09/16   Copland, Frederico Hamman, MD  NITROSTAT 0.4 MG SL tablet PLACE 1 TABLET (0.4 MG TOTAL) UNDER THE TONGUE EVERY 5 (FIVE) MINUTES AS NEEDED FOR CHEST PAIN. Patient not taking: Reported on 05/30/2017 09/01/15   Burnell Blanks, MD  pantoprazole (PROTONIX) 40 MG tablet Take 1 tablet (40 mg total) by mouth daily. 02/06/17   Biagio Borg, MD  potassium chloride (K-DUR) 10 MEQ tablet Take 1 tablet (10 mEq total) by mouth daily. 04/12/17   Biagio Borg, MD  pramipexole (MIRAPEX) 1 MG tablet Take 1 tablet (1 mg total) by mouth 3 (three) times daily. 02/06/17   Biagio Borg, MD    Family History Family History  Problem Relation Age of Onset  . Heart attack Father 14       Smoker  . Heart attack Brother 35  . Lung cancer Sister   . Colon cancer Neg Hx     Social History Social History  Substance Use Topics  . Smoking status: Former Smoker    Packs/day: 1.00    Years: 15.00    Types: Cigarettes    Quit date: 02/05/1977  . Smokeless tobacco: Never Used  . Alcohol use 0.5 oz/week    1 Standard drinks or equivalent per week     Comment: RARE     Allergies     Amitriptyline hcl; Diphenhydramine hcl; Simvastatin; and Tylenol [acetaminophen]   Review of Systems Review of Systems  Constitutional: Negative for appetite change.  HENT: Negative for congestion.   Respiratory: Negative for shortness of breath.   Cardiovascular: Negative for chest pain.  Genitourinary: Negative for flank pain.  Musculoskeletal: Negative for back pain.  Neurological: Positive for dizziness. Negative for syncope.  Psychiatric/Behavioral: Positive for confusion.     Physical Exam Updated Vital Signs BP (!) 151/69   Pulse (!) 51   Temp 97.6 F (36.4 C) (Oral)   Resp 18   SpO2 96%   Physical Exam  Constitutional: He appears well-developed.  HENT:  Hematoma to left frontal forehead with approximately 2 cm curvilinear laceration  Neck: Neck supple.  Cardiovascular:  Bradycardia  Pulmonary/Chest: Effort normal.  Abdominal: Soft. There is no tenderness.  Musculoskeletal: He exhibits no edema.  Right forearm with dressing from fall yesterday.  Neurological: He is alert.  Patient is awake and pleasant.  Able to remember the fall and the events around it.  Unable to give me the month but states that it is November.  He had said that it was December earlier in the day.  Skin: Skin is warm. Capillary refill takes less than 2 seconds.     ED Treatments / Results  Labs (all labs ordered are listed, but only abnormal results are displayed) Labs Reviewed  URINALYSIS, ROUTINE W REFLEX MICROSCOPIC - Abnormal; Notable for the following:       Result Value   Color, Urine STRAW (*)    Protein, ur 100 (*)    Squamous Epithelial / LPF 0-5 (*)    All other components within normal limits  CBC WITH DIFFERENTIAL/PLATELET - Abnormal; Notable for the following:    RBC 3.06 (*)    Hemoglobin 8.3 (*)    HCT 26.5 (*)    Platelets 111 (*)    All other components within normal limits  BASIC METABOLIC PANEL - Abnormal; Notable for the following:    Glucose, Bld 100 (*)     BUN 34 (*)    Creatinine, Ser 1.86 (*)  GFR calc non Af Amer 33 (*)    GFR calc Af Amer 39 (*)    All other components within normal limits  TROPONIN I  CBC WITH DIFFERENTIAL/PLATELET    EKG  EKG Interpretation None       Radiology Ct Head Wo Contrast  Result Date: 06/08/2017 CLINICAL DATA:  Status post fall. Hit head, with laceration at the left forehead. Lethargy. Patient on Plavix. Initial encounter. EXAM: CT HEAD WITHOUT CONTRAST TECHNIQUE: Contiguous axial images were obtained from the base of the skull through the vertex without intravenous contrast. COMPARISON:  CT of the head performed 01/04/2017, and MRI of the brain performed 07/22/2008 FINDINGS: Brain: No evidence of acute infarction, hemorrhage, hydrocephalus, extra-axial collection or mass lesion/mass effect. Prominence of the ventricles and sulci reflects moderately severe cortical volume loss. Cerebellar atrophy is noted. Diffuse periventricular and subcortical white matter change likely reflects small vessel ischemic microangiopathy. Chronic ischemic change is noted at the basal ganglia bilaterally. 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 The brainstem and fourth ventricle are within normal limits. No mass effect or midline shift is seen. Vascular: No hyperdense vessel or unexpected calcification. Skull: There is no evidence of fracture; visualized osseous structures are unremarkable in appearance. Sinuses/Orbits: The orbits are within normal limits. The paranasal sinuses and mastoid air cells are well-aerated. Other: Soft tissue swelling and laceration are noted overlying the high left frontal calvarium. IMPRESSION: 1. No evidence of traumatic intracranial injury or fracture. 2. Soft tissue swelling and laceration overlying the high left frontal calvarium. 3. Moderately severe cortical volume loss and diffuse small vessel ischemic microangiopathy. 4. Chronic ischemic change at the basal ganglia  bilaterally. 5. 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 Electronically Signed   By: Garald Balding M.D.   On: 06/08/2017 06:44    Procedures .Marland KitchenLaceration Repair Date/Time: 06/08/2017 9:28 AM Performed by: Davonna Belling Authorized by: Davonna Belling   Consent:    Consent obtained:  Verbal   Consent given by:  Patient   Risks discussed:  Infection, pain, poor cosmetic result, poor wound healing, vascular damage, nerve damage and need for additional repair   Alternatives discussed:  No treatment Anesthesia (see MAR for exact dosages):    Anesthesia method:  Local infiltration   Local anesthetic:  Lidocaine 1% w/o epi Laceration details:    Location:  Face   Face location:  Forehead   Length (cm):  3.5 Repair type:    Repair type:  Simple Pre-procedure details:    Preparation:  Patient was prepped and draped in usual sterile fashion Exploration:    Hemostasis achieved with:  Direct pressure   Wound exploration: wound explored through full range of motion     Contaminated: no   Treatment:    Area cleansed with:  Saline   Amount of cleaning:  Standard   Irrigation method:  Pressure wash Skin repair:    Repair method:  Sutures   Suture size:  4-0   Wound skin closure material used: vicryl rapide.   Suture technique:  Simple interrupted   Number of sutures:  8 Approximation:    Approximation:  Close   Vermilion border: well-aligned   Post-procedure details:    Dressing:  Sterile dressing   Patient tolerance of procedure:  Tolerated well, no immediate complications   (including critical care time)  Medications Ordered in ED Medications  lidocaine (PF) (XYLOCAINE) 1 % injection 5 mL (not administered)     Initial Impression / Assessment and  Plan / ED Course  I have reviewed the triage vital signs and the nursing notes.  Pertinent labs & imaging results that were available during my care of the patient were reviewed by me and considered in my medical  decision making (see chart for details).     Patient presented after a fall.  Has had frequent falls recently.  Other falls of been due to dizziness with standing.  Reportedly gets lightheaded.  Today fell after what wife says was dizziness but patient thought was just tripping over something.  However he is still somewhat confused and rather sleepy.  Head CT reassuring.  Laceration closed on forehead.  Hemoglobin has dropped a gram and a half over the last month.  Could be due to the wound.  Has bradycardia.  Has had for a while but his heart rate will dip down in the 40s.  Had one episode into the 40s where he felt dizzy today.  States it was because it was turning his head.  With the dizziness and mental status changes I feel as if he would benefit from an observation.  Also has some chronic cerebellar stroke which could contribute somewhat to the dizziness but he is not able to comply too much with a more in-depth cerebellar exam right now.  Will admit to hospitalist.  Final Clinical Impressions(s) / ED Diagnoses   Final diagnoses:  Fall, initial encounter  Anemia, unspecified type  Laceration of forehead, initial encounter  Bradycardia  Concussion without loss of consciousness, initial encounter    New Prescriptions New Prescriptions   No medications on file     Davonna Belling, MD 06/08/17 0920    Davonna Belling, MD 06/08/17 [phone]

## 2017-06-09 DIAGNOSIS — N183 Chronic kidney disease, stage 3 (moderate): Secondary | ICD-10-CM | POA: Diagnosis not present

## 2017-06-09 DIAGNOSIS — I25119 Atherosclerotic heart disease of native coronary artery with unspecified angina pectoris: Secondary | ICD-10-CM

## 2017-06-09 DIAGNOSIS — D649 Anemia, unspecified: Secondary | ICD-10-CM | POA: Diagnosis not present

## 2017-06-09 DIAGNOSIS — W19XXXA Unspecified fall, initial encounter: Secondary | ICD-10-CM

## 2017-06-09 DIAGNOSIS — I10 Essential (primary) hypertension: Secondary | ICD-10-CM

## 2017-06-09 DIAGNOSIS — R001 Bradycardia, unspecified: Secondary | ICD-10-CM | POA: Diagnosis not present

## 2017-06-09 LAB — BASIC METABOLIC PANEL
ANION GAP: 10 (ref 5–15)
BUN: 28 mg/dL — ABNORMAL HIGH (ref 6–20)
CALCIUM: 9.4 mg/dL (ref 8.9–10.3)
CO2: 24 mmol/L (ref 22–32)
Chloride: 103 mmol/L (ref 101–111)
Creatinine, Ser: 1.72 mg/dL — ABNORMAL HIGH (ref 0.61–1.24)
GFR calc non Af Amer: 37 mL/min — ABNORMAL LOW (ref >60)
GFR, EST AFRICAN AMERICAN: 42 mL/min — AB (ref >60)
GLUCOSE: 101 mg/dL — AB (ref 65–99)
POTASSIUM: 4 mmol/L (ref 3.5–5.1)
Sodium: 137 mmol/L (ref 135–145)

## 2017-06-09 LAB — CBC
HEMATOCRIT: 33.6 % — AB (ref 39.0–52.0)
HEMOGLOBIN: 10.7 g/dL — AB (ref 13.0–17.0)
MCH: 27.6 pg (ref 26.0–34.0)
MCHC: 31.8 g/dL (ref 30.0–36.0)
MCV: 86.6 fL (ref 78.0–100.0)
Platelets: 167 10*3/uL (ref 150–400)
RBC: 3.88 MIL/uL — AB (ref 4.22–5.81)
RDW: 14.5 % (ref 11.5–15.5)
WBC: 9.4 10*3/uL (ref 4.0–10.5)

## 2017-06-09 LAB — MRSA PCR SCREENING: MRSA BY PCR: POSITIVE — AB

## 2017-06-09 MED ORDER — CITALOPRAM HYDROBROMIDE 20 MG PO TABS
20.0000 mg | ORAL_TABLET | Freq: Every day | ORAL | Status: DC
  Administered 2017-06-10: 20 mg via ORAL
  Filled 2017-06-09: qty 1

## 2017-06-09 MED ORDER — MUPIROCIN 2 % EX OINT
1.0000 "application " | TOPICAL_OINTMENT | Freq: Two times a day (BID) | CUTANEOUS | Status: DC
  Administered 2017-06-09 – 2017-06-10 (×3): 1 via NASAL
  Filled 2017-06-09 (×2): qty 22

## 2017-06-09 MED ORDER — CHLORHEXIDINE GLUCONATE CLOTH 2 % EX PADS
6.0000 | MEDICATED_PAD | Freq: Every day | CUTANEOUS | Status: DC
  Administered 2017-06-10: 6 via TOPICAL

## 2017-06-09 NOTE — Progress Notes (Signed)
Patient ID: Victor Davis, male   DOB: [birth year], 76 y.o.   MRN: [id number]  PROGRESS NOTE    Victor Davis  XNA:[id number] DOB: Jan 07, 1941 DOA: 06/08/2017 PCP: Biagio Borg, MD   Brief Narrative:  76 year old male with history of anemia, coronary artery disease with previous angioplasty and stent placement, dementia, recurrent falls, non-small cell lung cancer and prostate cancer, hypertension and hyperlipidemia presented with presumed mechanical fall at home. He was found to have bradycardia. Cardiology was consulted. Clonidine was held.  Assessment & Plan:   Principal Problem:   Bradycardia/Falls Active Problems:   Peripheral vascular disease (HCC)   CAD (coronary artery disease)   Hypertension   CKD (chronic kidney disease), stage III ([place])   Head injury without skull fracture/S/p Fall with Lt Frontal Laceration   Symptomatic sinus bradycardia   1)Symptomatic Bradycardia- with recurrent falls - Cardiology evaluation appreciated. Monitor heart rate. Clonidine on hold - Echo shows ejection fraction of 60-65%. - Troponins negative - Fall precautions. Continue monitoring orthostatic vitals  2)Recurrent Falls - PT eval. Care management consult  3) chronic anemia - Hemoglobin is 10.7 today. Outpatient follow-up  4)CAD - Stable. Cardiology following. Continue aspirin, Plavix, statin and isosorbide - No chest pain currently  5)CKD 3 - Creatinine stable. Monitor  6) hypertension - Blood pressure stable. Continue isosorbide and amlodipine. Keep clonidine on hold  7)H/o [place] Lung Ca and Prostate Ca- sees Oncology (Dr Julien Nordmann) and Urology   8) dementia   - Continue Aricept. Decrease Celexa to 20 mg daily because of risk for QT prolongation    DVT prophylaxis:  SCDs Code Status:  Full Family Communication: None at bedside Disposition Plan: Probably home in 1-2 days  Consultants: Cardiology   Procedures: Echo on 06/08/2017 Study Conclusions  - Left  ventricle: The cavity size was normal. Wall thickness was   increased in a pattern of mild LVH. Systolic function was normal.   The estimated ejection fraction was in the range of 60% to 65%.   Wall motion was normal; there were no regional wall motion   abnormalities. - Aortic valve: There was mild regurgitation. - Mitral valve: Calcified annulus. There was mild regurgitation.   Antimicrobials: None  Subjective: Patient seen and examined at bedside.   Patient is awake but slightly confused. He denies any overnight fever or vomiting. No current chest pain  Objective: Vitals:   06/08/17 1344 06/08/17 1924 06/09/17 0535 06/09/17 0823  BP: (!) 163/56 (!) 130/58 (!) 121/51 120/70  Pulse: 69 (!) 59 62   Resp: 20 19    Temp: 97.7 F (36.5 C) 98.5 F (36.9 C) 99 F (37.2 C)   TempSrc: Oral Oral Oral   SpO2: 100% 99% 97%   Weight: 63.5 kg (139 lb 14.4 oz)  61 kg (134 lb 8 oz)   Height: 5\' 7"  (1.702 m)       Intake/Output Summary (Last 24 hours) at 06/09/17 1008 Last data filed at 06/09/17 0905  Gross per 24 hour  Intake              495 ml  Output              551 ml  Net              -56 ml   Filed Weights   06/08/17 1344 06/09/17 0535  Weight: 63.5 kg (139 lb 14.4 oz) 61 kg (134 lb 8 oz)    Examination:  General exam: Appears calm and  comfortable; slightly confused Respiratory system: Bilateral decreased breath sound at bases  Cardiovascular system: S1 & S2 heard, mild intermittent bradycardia  Gastrointestinal system: Abdomen is nondistended, soft and nontender. Normal bowel sounds heard. Extremities: No cyanosis, clubbing, edema   Data Reviewed: I have personally reviewed following labs and imaging studies  CBC:  Recent Labs Lab 06/08/17 0747 06/09/17 0643  WBC 6.3 9.4  NEUTROABS 4.3  --   HGB 8.3* 10.7*  HCT 26.5* 33.6*  MCV 86.6 86.6  PLT 111* 409   Basic Metabolic Panel:  Recent Labs Lab 06/08/17 0747 06/09/17 0643  NA 137 137  K 4.4 4.0  CL  107 103  CO2 25 24  GLUCOSE 100* 101*  BUN 34* 28*  CREATININE 1.86* 1.72*  CALCIUM 8.9 9.4   GFR: Estimated Creatinine Clearance: 31 mL/min (A) (by C-G formula based on SCr of 1.72 mg/dL (H)). Liver Function Tests: No results for input(s): AST, ALT, ALKPHOS, BILITOT, PROT, ALBUMIN in the last 168 hours. No results for input(s): LIPASE, AMYLASE in the last 168 hours. No results for input(s): AMMONIA in the last 168 hours. Coagulation Profile: No results for input(s): INR, PROTIME in the last 168 hours. Cardiac Enzymes:  Recent Labs Lab 06/08/17 0747 06/08/17 1031 06/08/17 1654 06/08/17 2206  TROPONINI <0.03 <0.03 <0.03 <0.03   BNP (last 3 results) No results for input(s): PROBNP in the last 8760 hours. HbA1C: No results for input(s): HGBA1C in the last 72 hours. CBG: No results for input(s): GLUCAP in the last 168 hours. Lipid Profile: No results for input(s): CHOL, HDL, LDLCALC, TRIG, CHOLHDL, LDLDIRECT in the last 72 hours. Thyroid Function Tests: No results for input(s): TSH, T4TOTAL, FREET4, T3FREE, THYROIDAB in the last 72 hours. Anemia Panel: No results for input(s): VITAMINB12, FOLATE, FERRITIN, TIBC, IRON, RETICCTPCT in the last 72 hours. Sepsis Labs: No results for input(s): PROCALCITON, LATICACIDVEN in the last 168 hours.  No results found for this or any previous visit (from the past 240 hour(s)).       Radiology Studies: Ct Head Wo Contrast  Result Date: 06/08/2017 CLINICAL DATA:  Status post fall. Hit head, with laceration at the left forehead. Lethargy. Patient on Plavix. Initial encounter. EXAM: CT HEAD WITHOUT CONTRAST TECHNIQUE: Contiguous axial images were obtained from the base of the skull through the vertex without intravenous contrast. COMPARISON:  CT of the head performed 01/04/2017, and MRI of the brain performed 07/22/2008 FINDINGS: Brain: No evidence of acute infarction, hemorrhage, hydrocephalus, extra-axial collection or mass lesion/mass  effect. Prominence of the ventricles and sulci reflects moderately severe cortical volume loss. Cerebellar atrophy is noted. Diffuse periventricular and subcortical white matter change likely reflects small vessel ischemic microangiopathy. Chronic ischemic change is noted at the basal ganglia bilaterally. 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 The brainstem and fourth ventricle are within normal limits. No mass effect or midline shift is seen. Vascular: No hyperdense vessel or unexpected calcification. Skull: There is no evidence of fracture; visualized osseous structures are unremarkable in appearance. Sinuses/Orbits: The orbits are within normal limits. The paranasal sinuses and mastoid air cells are well-aerated. Other: Soft tissue swelling and laceration are noted overlying the high left frontal calvarium. IMPRESSION: 1. No evidence of traumatic intracranial injury or fracture. 2. Soft tissue swelling and laceration overlying the high left frontal calvarium. 3. Moderately severe cortical volume loss and diffuse small vessel ischemic microangiopathy. 4. Chronic ischemic change at the basal ganglia bilaterally. 5. 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  Electronically Signed   By: Garald Balding M.D.   On: 06/08/2017 06:44        Scheduled Meds: . amLODipine  10 mg Oral Daily  . aspirin EC  81 mg Oral Daily  . citalopram  40 mg Oral Daily  . clopidogrel  75 mg Oral Daily  . donepezil  5 mg Oral QHS  . hydrALAZINE  25 mg Oral Q8H  . isosorbide mononitrate  30 mg Oral Daily  . lidocaine (PF)  5 mL Infiltration Once  . pantoprazole  40 mg Oral Daily  . potassium chloride  10 mEq Oral Daily  . pramipexole  0.5 mg Oral TID  . pravastatin  20 mg Oral q1800  . senna  1 tablet Oral BID  . sodium chloride flush  3 mL Intravenous Q12H   Continuous Infusions: . sodium chloride       LOS: 1 day        Aline August, MD [facility] Pager  [phone]  If 7PM-7AM, please contact night-coverage [email or web address] Password [email or web address] 06/09/2017, 10:08 AM

## 2017-06-09 NOTE — Care Management Obs Status (Signed)
[facility] NOTIFICATION   Patient Details  Name: NEVADA MULLETT MRN: [id number] Date of Birth: 1941/03/09   Medicare Observation Status Notification Given:  Yes    Maryclare Labrador, RN 06/09/2017, 1:02 PM

## 2017-06-09 NOTE — Progress Notes (Signed)
Pt's MRSA came back positive which was swabbed this morning. Contact isolation has kept in place, will follow the MRSA protocol.

## 2017-06-09 NOTE — Evaluation (Signed)
Occupational Therapy Evaluation Patient Details Name: Victor Davis MRN: [id number] DOB: Aug 11, 1941 Today's Date: 06/09/2017    History of Present Illness Pt is a 76 y/o male admitted secondary to falls at home that were mechanical in nature. PMH including but not limited to PAD, PVD, CKD, HTN, bradycardia.   Clinical Impression   Pt reports he was mod I with ADL PTA. Currently pt requires min guard for ADL and functional mobility due to impaired balance in standing. Pt reports significant hx of falls at home over the past 6 months. Pt planning to d/c home with 24/7 supervision from family. Pt would benefit from continued skilled OT to address established goals.    Follow Up Recommendations  No OT follow up;Supervision/Assistance - 24 hour    Equipment Recommendations  None recommended by OT    Recommendations for Other Services       Precautions / Restrictions Precautions Precautions: Fall Precaution Comments: reports several falls in the last 6 months Restrictions Weight Bearing Restrictions: No      Mobility Bed Mobility Overal bed mobility: Modified Independent             General bed mobility comments: increased time and effort  Transfers Overall transfer level: Needs assistance Equipment used: None Transfers: Sit to/from Stand Sit to Stand: Min guard         General transfer comment: increased time, min guard for safety    Balance Overall balance assessment: Needs assistance;History of Falls Sitting-balance support: Feet supported;No upper extremity supported Sitting balance-Leahy Scale: Good     Standing balance support: No upper extremity supported;During functional activity Standing balance-Leahy Scale: Fair                             ADL either performed or assessed with clinical judgement   ADL Overall ADL's : Needs assistance/impaired Eating/Feeding: Independent;Sitting   Grooming: Min guard;Standing   Upper Body Bathing:  Set up;Supervision/ safety;Sitting   Lower Body Bathing: Min guard;Sit to/from stand   Upper Body Dressing : Set up;Supervision/safety;Sitting   Lower Body Dressing: Min guard;Sit to/from stand   Toilet Transfer: Min guard;Ambulation;BSC Toilet Transfer Details (indicate cue type and reason): Simulated by sit to stand from EOB with functional mobility     Tub/ Shower Transfer: Min guard;Tub transfer;Ambulation;Shower Scientist, research (medical) Details (indicate cue type and reason): Simulated in room Functional mobility during ADLs: Min guard       Vision         Perception     Praxis      Pertinent Vitals/Pain Pain Assessment: No/denies pain     Hand Dominance     Extremity/Trunk Assessment Upper Extremity Assessment Upper Extremity Assessment: Generalized weakness   Lower Extremity Assessment Lower Extremity Assessment: Defer to PT evaluation       Communication Communication Communication: No difficulties   Cognition Arousal/Alertness: Awake/alert Behavior During Therapy: WFL for tasks assessed/performed Overall Cognitive Status: Within Functional Limits for tasks assessed                                     General Comments       Exercises     Shoulder Instructions      Home Living Family/patient expects to be discharged to:: Private residence Living Arrangements: Spouse/significant other Available Help at Discharge: Family Type of Home: House Home Access: Stairs to enter  Entrance Stairs-Number of Steps: 3 Entrance Stairs-Rails: Right Home Layout: One level     Bathroom Shower/Tub: Tub/shower unit;Curtain   Biochemist, clinical: Standard     Home Equipment: Shower seat;Cane - single point;Bedside commode          Prior Functioning/Environment Level of Independence: Independent        Comments: uses shower chair for bathing, drives        OT Problem List: Decreased strength;Impaired balance (sitting and/or  standing);Decreased safety awareness;Decreased knowledge of use of DME or AE      OT Treatment/Interventions: Self-care/ADL training;Energy conservation;DME and/or AE instruction;Therapeutic activities;Patient/family education;Balance training    OT Goals(Current goals can be found in the care plan section) Acute Rehab OT Goals Patient Stated Goal: return home OT Goal Formulation: With patient/family Time For Goal Achievement: 06/23/17 Potential to Achieve Goals: Good ADL Goals Pt Will Perform Grooming: with modified independence;standing Pt Will Transfer to Toilet: with modified independence;ambulating;bedside commode Pt Will Perform Toileting - Clothing Manipulation and hygiene: with modified independence;sit to/from stand Pt Will Perform Tub/Shower Transfer: with modified independence;ambulating;shower seat Additional ADL Goal #1: Pt will gather ADL items and perform ADL with mod I.  OT Frequency: Min 2X/week   Barriers to D/C:            Co-evaluation              AM-PAC PT "6 Clicks" Daily Activity     Outcome Measure Help from another person eating meals?: None Help from another person taking care of personal grooming?: A Little Help from another person toileting, which includes using toliet, bedpan, or urinal?: A Little Help from another person bathing (including washing, rinsing, drying)?: A Little Help from another person to put on and taking off regular upper body clothing?: None Help from another person to put on and taking off regular lower body clothing?: A Little 6 Click Score: 20   End of Session Equipment Utilized During Treatment: Gait belt Nurse Communication: Mobility status  Activity Tolerance: Patient tolerated treatment well Patient left: in bed;with call bell/phone within reach;with family/visitor present  OT Visit Diagnosis: Unsteadiness on feet (R26.81);Other abnormalities of gait and mobility (R26.89);Repeated falls (R29.6);Muscle weakness  (generalized) (M62.81)                Time: 3545-6256 OT Time Calculation (min): 10 min Charges:  OT General Charges $OT Visit: 1 Visit OT Evaluation $OT Eval Moderate Complexity: 1 Mod G-Codes: OT G-codes **NOT FOR INPATIENT CLASS** Functional Assessment Tool Used: Clinical judgement Functional Limitation: Self care Self Care Current Status (L8937): At least 20 percent but less than 40 percent impaired, limited or restricted Self Care Goal Status (D4287): At least 1 percent but less than 20 percent impaired, limited or restricted   Mel Almond A. Ulice Brilliant, M.S., OTR/L Pager: [place] 06/09/2017, 2:28 PM

## 2017-06-09 NOTE — Care Management CC44 (Signed)
Condition Code 44 Documentation Completed  Patient Details  Name: Victor Davis MRN: [id number] Date of Birth: [birth year]   Condition Code 44 given:  Yes Patient signature on Condition Code 44 notice:  Yes Documentation of 2 MD's agreement:  Yes Code 44 added to claim:  Yes    Maryclare Labrador, RN 06/09/2017, 1:02 PM

## 2017-06-09 NOTE — Care Management Note (Signed)
Case Management Note  Patient Details  Name: Victor Davis MRN: [id number] Date of Birth: 1941-07-01  Subjective/Objective:     Pt admitted with bradycardia               Action/Plan:  PTA independent from home with wife.  Pt has PCP and denied barriers to obtaining and paying for medications.  Pt declined [facility] as recommended.  CM will continue to follow for discharge needs  Expected Discharge Date:  06/11/17               Expected Discharge Plan:  Home/Self Care  In-House Referral:     Discharge planning Services  CM Consult  Post Acute Care Choice:    Choice offered to:     DME Arranged:    DME Agency:     HH Arranged:    HH Agency:     Status of Service:  In process, will continue to follow  If discussed at Long Length of Stay Meetings, dates discussed:    Additional Comments:  Maryclare Labrador, RN 06/09/2017, 12:59 PM

## 2017-06-10 ENCOUNTER — Other Ambulatory Visit: Payer: Self-pay | Admitting: Internal Medicine

## 2017-06-10 DIAGNOSIS — R001 Bradycardia, unspecified: Secondary | ICD-10-CM | POA: Diagnosis not present

## 2017-06-10 DIAGNOSIS — N183 Chronic kidney disease, stage 3 (moderate): Secondary | ICD-10-CM | POA: Diagnosis not present

## 2017-06-10 LAB — CBC WITH DIFFERENTIAL/PLATELET
BASOS ABS: 0 10*3/uL (ref 0.0–0.1)
Basophils Relative: 0 %
EOS ABS: 0.1 10*3/uL (ref 0.0–0.7)
EOS PCT: 1 %
HCT: 30.8 % — ABNORMAL LOW (ref 39.0–52.0)
Hemoglobin: 9.7 g/dL — ABNORMAL LOW (ref 13.0–17.0)
LYMPHS ABS: 1.7 10*3/uL (ref 0.7–4.0)
Lymphocytes Relative: 21 %
MCH: 27.2 pg (ref 26.0–34.0)
MCHC: 31.5 g/dL (ref 30.0–36.0)
MCV: 86.5 fL (ref 78.0–100.0)
Monocytes Absolute: 0.8 10*3/uL (ref 0.1–1.0)
Monocytes Relative: 10 %
Neutro Abs: 5.4 10*3/uL (ref 1.7–7.7)
Neutrophils Relative %: 68 %
Platelets: 136 10*3/uL — ABNORMAL LOW (ref 150–400)
RBC: 3.56 MIL/uL — AB (ref 4.22–5.81)
RDW: 14.8 % (ref 11.5–15.5)
WBC: 7.9 10*3/uL (ref 4.0–10.5)

## 2017-06-10 LAB — BASIC METABOLIC PANEL
ANION GAP: 9 (ref 5–15)
BUN: 33 mg/dL — ABNORMAL HIGH (ref 6–20)
CALCIUM: 9 mg/dL (ref 8.9–10.3)
CHLORIDE: 104 mmol/L (ref 101–111)
CO2: 23 mmol/L (ref 22–32)
Creatinine, Ser: 1.8 mg/dL — ABNORMAL HIGH (ref 0.61–1.24)
GFR calc Af Amer: 40 mL/min — ABNORMAL LOW (ref >60)
GFR calc non Af Amer: 35 mL/min — ABNORMAL LOW (ref >60)
GLUCOSE: 107 mg/dL — AB (ref 65–99)
Potassium: 4.1 mmol/L (ref 3.5–5.1)
SODIUM: 136 mmol/L (ref 135–145)

## 2017-06-10 LAB — MAGNESIUM: Magnesium: 2.5 mg/dL — ABNORMAL HIGH (ref 1.7–2.4)

## 2017-06-10 MED ORDER — PRAMIPEXOLE DIHYDROCHLORIDE 1 MG PO TABS: 0.5000 mg | ORAL_TABLET | Freq: Two times a day (BID) | ORAL | Status: DC

## 2017-06-10 MED ORDER — CITALOPRAM HYDROBROMIDE 40 MG PO TABS: 20.0000 mg | ORAL_TABLET | Freq: Every day | ORAL | Status: DC

## 2017-06-10 MED ORDER — PRAMIPEXOLE DIHYDROCHLORIDE 1 MG PO TABS: 0.5000 mg | ORAL_TABLET | Freq: Three times a day (TID) | ORAL | Status: DC

## 2017-06-10 MED ORDER — HYDRALAZINE HCL 25 MG PO TABS: 25.0000 mg | ORAL_TABLET | Freq: Three times a day (TID) | ORAL | 0 refills | Status: DC

## 2017-06-10 NOTE — Progress Notes (Signed)
Pt got discharged to home, discharge instructions provided and patient showed understanding to it, IV taken out,Telemonitor DC,pt left unit in wheelchair with all of the belongings accompanied with a family member (wife) 

## 2017-06-10 NOTE — Discharge Summary (Signed)
Physician Discharge Summary  Victor Davis STM:[id number] DOB: December 06, 1940 DOA: 06/08/2017  PCP: Victor Borg, MD  Admit date: 06/08/2017 Discharge date: 06/10/2017  Admitted From: Home Disposition:  Home  Recommendations for Outpatient Follow-up:  1. Follow up with PCP in 1 week  2. Follow-up with cardiology in 1-2 weeks   Home Health: Yes  Equipment/Devices: None  Discharge Condition: Stable CODE STATUS: Full  Diet recommendation: Heart Healthy  Brief/Interim Summary: 76 year old male with history of anemia, coronary artery disease with previous angioplasty and stent placement, dementia, recurrent falls, non-small cell lung cancer and prostate cancer, hypertension and hyperlipidemia presented with presumed mechanical fall at home. He was found to have bradycardia. Cardiology was consulted. Clonidine was held. Heart rate improved. PT recommended home health PT. Patient will be discharged home today.  Discharge Diagnoses:  Principal Problem:   Bradycardia/Falls Active Problems:   Peripheral vascular disease ([place])   CAD (coronary artery disease)   Hypertension   CKD (chronic kidney disease), stage III ([place])   Head injury without skull fracture/S/p Fall with Lt Frontal Laceration   Symptomatic sinus bradycardia  1)Symptomatic Bradycardia- with recurrent falls - Cardiology evaluation appreciated. Clonidine discontinued. Heart rate improved and stable. - Echo shows ejection fraction of 60-65%. - Troponins negative - Outpatient follow-up with cardiology  2)Recurrent Falls - PT recommends home health PT  3) chronic anemia - Stable. Outpatient follow-up  4)CAD - Stable. Continue aspirin, Plavix, statin and isosorbide - No chest pain currently - Outpatient follow-up with cardiology   5)CKD 3 - Creatinine stable. Outpatient follow-up  6) hypertension - Blood pressure stable. Continue isosorbide and amlodipine and hydralazine. Discontinue clonidine  7)H/o NSC  Lung Ca and Prostate Ca- sees Oncology (Dr Victor Davis) and Urology   8) dementia   - Continue Aricept. Decrease Celexa to 20 mg daily because of risk for QT prolongation - Decreased Mirapex to 0.5 mg twice a day  Discharge Instructions  Discharge Instructions    Ambulatory referral to Cardiology    Complete by:  As directed    Follow up in 1-2 weeks   Call MD for:  difficulty breathing, headache or visual disturbances    Complete by:  As directed    Call MD for:  extreme fatigue    Complete by:  As directed    Call MD for:  hives    Complete by:  As directed    Call MD for:  persistant dizziness or light-headedness    Complete by:  As directed    Call MD for:  persistant nausea and vomiting    Complete by:  As directed    Call MD for:  severe uncontrolled pain    Complete by:  As directed    Call MD for:  temperature >100.4    Complete by:  As directed    Diet - low sodium heart healthy    Complete by:  As directed    Face-to-face encounter (required for Medicare/Medicaid patients)    Complete by:  As directed    I Victor Davis certify that this patient is under my care and that I, or a nurse practitioner or physician's assistant working with me, had a face-to-face encounter that meets the physician face-to-face encounter requirements with this patient on 06/10/2017. The encounter with the patient was in whole, or in part for the following medical condition(s) which is the primary reason for home health care (List medical condition): Bradycardia/fall   The encounter with the patient was in whole, or  in part, for the following medical condition, which is the primary reason for home health care:  Bradycardia/fall   I certify that, based on my findings, the following services are medically necessary home health services:   Nursing Physical therapy     Reason for Medically Necessary Home Health Services:   Skilled Nursing- Change/Decline in Patient Status Therapy- Therapeutic  Exercises to Increase Strength and Endurance     My clinical findings support the need for the above services:  Unable to leave home safely without assistance and/or assistive device   Further, I certify that my clinical findings support that this patient is homebound due to:  Unable to leave home safely without assistance   Home Health    Complete by:  As directed    To provide the following care/treatments:   PT RN     Increase activity slowly    Complete by:  As directed      Allergies as of 06/10/2017      Reactions   Amitriptyline Hcl Other (See Comments)   REACTION: confusion   Diphenhydramine Hcl Other (See Comments)   Keeps him awake   Simvastatin Other (See Comments)   REACTION: leg pain   Tylenol [acetaminophen] Other (See Comments)   Dr advised pt not to take due to finding a spot on pt's kidney      Medication List    STOP taking these medications   cloNIDine 0.1 MG tablet Commonly known as:  CATAPRES   potassium chloride 10 MEQ tablet Commonly known as:  K-DUR     TAKE these medications   amLODipine 10 MG tablet Commonly known as:  NORVASC Take 1 tablet (10 mg total) by mouth daily.   aspirin 81 MG EC tablet Take 81 mg by mouth daily.   citalopram 40 MG tablet Commonly known as:  CELEXA Take 0.5 tablets (20 mg total) by mouth daily. What changed:  See the new instructions.   clopidogrel 75 MG tablet Commonly known as:  PLAVIX TAKE 1 TABLET BY MOUTH  DAILY What changed:  See the new instructions.   donepezil 5 MG tablet Commonly known as:  ARICEPT Take 1 tablet (5 mg total) by mouth at bedtime.   gabapentin 100 MG capsule Commonly known as:  NEURONTIN TAKE 1 CAPSULE BY MOUTH TWO TIMES DAILY What changed:  See the new instructions.   hydrALAZINE 25 MG tablet Commonly known as:  APRESOLINE Take 1 tablet (25 mg total) by mouth every 8 (eight) hours.   isosorbide mononitrate 30 MG 24 hr tablet Commonly known as:  IMDUR Take 1 tablet (30 mg  total) by mouth daily.   lovastatin 40 MG tablet Commonly known as:  MEVACOR TAKE 1 TABLET BY MOUTH AT  BEDTIME What changed:  See the new instructions.   meclizine 25 MG tablet Commonly known as:  ANTIVERT Take 0.5-1 tablets (12.5-25 mg total) by mouth 3 (three) times daily as needed for dizziness.   NITROSTAT 0.4 MG SL tablet Generic drug:  nitroGLYCERIN PLACE 1 TABLET (0.4 MG TOTAL) UNDER THE TONGUE EVERY 5 (FIVE) MINUTES AS NEEDED FOR CHEST PAIN.   pantoprazole 40 MG tablet Commonly known as:  PROTONIX Take 1 tablet (40 mg total) by mouth daily.   pramipexole 1 MG tablet Commonly known as:  MIRAPEX Take 0.5 tablets (0.5 mg total) by mouth 2 (two) times daily. What changed:  how much to take  when to take this      Follow-up Information  Victor Borg, MD Follow up in 1 week(s).   Specialties:  Internal Medicine, Radiology Contact information: [address] [place] [address] [phone]        Burnell Blanks, MD. Schedule an appointment as soon as possible for a visit.   Specialty:  Cardiology Why:  follow up in 1-2 weeks Contact information: [address]  [place] [address] [phone]          Allergies  Allergen Reactions  . Amitriptyline Hcl Other (See Comments)    REACTION: confusion  . Diphenhydramine Hcl Other (See Comments)    Keeps him awake  . Simvastatin Other (See Comments)    REACTION: leg pain  . Tylenol [Acetaminophen] Other (See Comments)    Dr advised pt not to take due to finding a spot on pt's kidney    Consultations:  Cardiology   Procedures/Studies: Ct Head Wo Contrast  Result Date: 06/08/2017 CLINICAL DATA:  Status post fall. Hit head, with laceration at the left forehead. Lethargy. Patient on Plavix. Initial encounter. EXAM: CT HEAD WITHOUT CONTRAST TECHNIQUE: Contiguous axial images were obtained from the base of the skull through the vertex without intravenous contrast.  COMPARISON:  CT of the head performed 01/04/2017, and MRI of the brain performed 07/22/2008 FINDINGS: Brain: No evidence of acute infarction, hemorrhage, hydrocephalus, extra-axial collection or mass lesion/mass effect. Prominence of the ventricles and sulci reflects moderately severe cortical volume loss. Cerebellar atrophy is noted. Diffuse periventricular and subcortical white matter change likely reflects small vessel ischemic microangiopathy. Chronic ischemic change is noted at the basal ganglia bilaterally. 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 The brainstem and fourth ventricle are within normal limits. No mass effect or midline shift is seen. Vascular: No hyperdense vessel or unexpected calcification. Skull: There is no evidence of fracture; visualized osseous structures are unremarkable in appearance. Sinuses/Orbits: The orbits are within normal limits. The paranasal sinuses and mastoid air cells are well-aerated. Other: Soft tissue swelling and laceration are noted overlying the high left frontal calvarium. IMPRESSION: 1. No evidence of traumatic intracranial injury or fracture. 2. Soft tissue swelling and laceration overlying the high left frontal calvarium. 3. Moderately severe cortical volume loss and diffuse small vessel ischemic microangiopathy. 4. Chronic ischemic change at the basal ganglia bilaterally. 5. 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 Electronically Signed   By: Garald Balding M.D.   On: 06/08/2017 06:44    Echo on 06/08/2017 Study Conclusions  - Left ventricle: The cavity size was normal. Wall thickness was increased in a pattern of mild LVH. Systolic function was normal. The estimated ejection fraction was in the range of 60% to 65%. Wall motion was normal; there were no regional wall motion abnormalities. - Aortic valve: There was mild regurgitation. - Mitral valve: Calcified annulus. There was mild  regurgitation.   Subjective: Patient seen and examined at bedside. He feels much better and wants to go home. No overnight fever or vomiting.  Discharge Exam: Vitals:   06/10/17 0503 06/10/17 0800  BP: (!) 178/68 (!) 160/80  Pulse: 71   Resp: 18   Temp: 98.4 F (36.9 C)   SpO2: 97%    Vitals:   06/09/17 1201 06/09/17 2039 06/10/17 0503 06/10/17 0800  BP:  (!) 164/73 (!) 178/68 (!) 160/80  Pulse: 73 77 71   Resp:  18 18   Temp:  98.1 F (36.7 C) 98.4 F (36.9 C)   TempSrc:  Oral Oral  SpO2:  98% 97%   Weight:   60.1 kg (132 lb 9.6 oz)   Height:        General: Pt is awake and in no distress Cardiovascular: Rate controlled, S1/S2 + Respiratory: Bilateral decreased breath sounds at bases  Abdominal: Soft, NT, ND, bowel sounds + Extremities: no edema, no cyanosis    The results of significant diagnostics from this hospitalization (including imaging, microbiology, ancillary and laboratory) are listed below for reference.     Microbiology: Recent Results (from the past 240 hour(s))  MRSA PCR Screening     Status: Abnormal   Collection Time: 06/09/17 10:13 AM  Result Value Ref Range Status   MRSA by PCR POSITIVE (A) NEGATIVE Final    Comment:        The GeneXpert MRSA Assay (FDA approved for NASAL specimens only), is one component of a comprehensive MRSA colonization surveillance program. It is not intended to diagnose MRSA infection nor to guide or monitor treatment for MRSA infections. RESULT CALLED TO, READ BACK BY AND VERIFIED WITH: L. [place] [id number] 10.27.2018 N. MORRIS      Labs: BNP (last 3 results) No results for input(s): BNP in the last 8760 hours. Basic Metabolic Panel:  Recent Labs Lab 06/08/17 0747 06/09/17 0643 06/10/17 0442  NA 137 137 136  K 4.4 4.0 4.1  CL 107 103 104  CO2 25 24 23   GLUCOSE 100* 101* 107*  BUN 34* 28* 33*  CREATININE 1.86* 1.72* 1.80*  CALCIUM 8.9 9.4 9.0  MG  --   --  2.5*   Liver Function Tests: No  results for input(s): AST, ALT, ALKPHOS, BILITOT, PROT, ALBUMIN in the last 168 hours. No results for input(s): LIPASE, AMYLASE in the last 168 hours. No results for input(s): AMMONIA in the last 168 hours. CBC:  Recent Labs Lab 06/08/17 0747 06/09/17 0643 06/10/17 0442  WBC 6.3 9.4 7.9  NEUTROABS 4.3  --  5.4  HGB 8.3* 10.7* 9.7*  HCT 26.5* 33.6* 30.8*  MCV 86.6 86.6 86.5  PLT 111* 167 136*   Cardiac Enzymes:  Recent Labs Lab 06/08/17 0747 06/08/17 1031 06/08/17 1654 06/08/17 2206  TROPONINI <0.03 <0.03 <0.03 <0.03   BNP: Invalid input(s): POCBNP CBG: No results for input(s): GLUCAP in the last 168 hours. D-Dimer No results for input(s): DDIMER in the last 72 hours. Hgb A1c No results for input(s): HGBA1C in the last 72 hours. Lipid Profile No results for input(s): CHOL, HDL, LDLCALC, TRIG, CHOLHDL, LDLDIRECT in the last 72 hours. Thyroid function studies No results for input(s): TSH, T4TOTAL, T3FREE, THYROIDAB in the last 72 hours.  Invalid input(s): FREET3 Anemia work up No results for input(s): VITAMINB12, FOLATE, FERRITIN, TIBC, IRON, RETICCTPCT in the last 72 hours. Urinalysis    Component Value Date/Time   COLORURINE STRAW (A) 06/08/2017 0800   APPEARANCEUR CLEAR 06/08/2017 0800   LABSPEC 1.011 06/08/2017 0800   PHURINE 7.0 06/08/2017 0800   GLUCOSEU NEGATIVE 06/08/2017 0800   GLUCOSEU NEGATIVE 04/11/2017 1013   HGBUR NEGATIVE 06/08/2017 0800   BILIRUBINUR NEGATIVE 06/08/2017 0800   KETONESUR NEGATIVE 06/08/2017 0800   PROTEINUR 100 (A) 06/08/2017 0800   UROBILINOGEN 0.2 04/11/2017 1013   NITRITE NEGATIVE 06/08/2017 0800   LEUKOCYTESUR NEGATIVE 06/08/2017 0800   Sepsis Labs Invalid input(s): PROCALCITONIN,  WBC,  LACTICIDVEN Microbiology Recent Results (from the past 240 hour(s))  MRSA PCR Screening     Status: Abnormal   Collection Time: 06/09/17 10:13 AM  Result Value Ref Range  Status   MRSA by PCR POSITIVE (A) NEGATIVE Final    Comment:         The GeneXpert MRSA Assay (FDA approved for NASAL specimens only), is one component of a comprehensive MRSA colonization surveillance program. It is not intended to diagnose MRSA infection nor to guide or monitor treatment for MRSA infections. RESULT CALLED TO, READ BACK BY AND VERIFIED WITH: L. [place] [id number] 10.27.2018 N. MORRIS      Time coordinating discharge: 35 minutes  SIGNED:   Aline August, MD  [facility] 06/10/2017, 10:24 AM Pager: [phone]  If 7PM-7AM, please contact night-coverage [email or web address] Password [email or web address]

## 2017-06-11 ENCOUNTER — Telehealth: Payer: Self-pay | Admitting: *Deleted

## 2017-06-11 MED ORDER — DONEPEZIL HCL 5 MG PO TABS: 5.0000 mg | ORAL_TABLET | Freq: Every day | ORAL | 3 refills | Status: DC

## 2017-06-11 NOTE — Telephone Encounter (Signed)
1st refill did not go through [facility] to [facility],.../lm,b

## 2017-06-11 NOTE — Telephone Encounter (Signed)
Transition Care Management Follow-up Telephone Call   Date discharged? 06/10/17   How have you been since you were released from the hospital? Spoke w/wife she states husband is doing alright   Do you understand why you were in the hospital? YES   Do you understand the discharge instructions? YES   Where were you discharged to? Home   Items Reviewed:  Medications reviewed: YES  Allergies reviewed: YES  Dietary changes reviewed: YES  Referrals reviewed: will be following up w/cardiology 06/25/17   Functional Questionnaire:   Activities of Daily Living (ADLs):   She states he are independent in the following: bathing and hygiene, feeding, continence, grooming and toileting States he require assistance with the following: ambulation and dressing   Any transportation issues/concerns?: NO   Any patient concerns? NO   Confirmed importance and date/time of follow-up visits scheduled YES, appt 06/19/17  Provider Appointment booked with Dr. Jenny Reichmann  Confirmed with patient if condition begins to worsen call PCP or go to the ER.  Patient was given the office number and encouraged to call back with question or concerns.  : YES

## 2017-06-11 NOTE — Addendum Note (Signed)
Addended by: Earnstine Regal on: 06/11/2017 03:47 PM   Modules accepted: Orders

## 2017-06-12 DIAGNOSIS — C61 Malignant neoplasm of prostate: Secondary | ICD-10-CM | POA: Diagnosis not present

## 2017-06-13 NOTE — Progress Notes (Signed)
PT Progress Note Addendum for G-Codes    06-26-17 1701  PT G-Codes **NOT FOR INPATIENT CLASS**  Functional Assessment Tool Used AM-PAC 6 Clicks Basic Mobility;Clinical judgement  Functional Limitation Mobility: Walking and moving around  Mobility: Walking and Moving Around Current Status [phone]) CK  Mobility: Walking and Moving Around Goal Status [phone]) CI   Sherie Don, [place], DPT [phone]

## 2017-06-19 ENCOUNTER — Ambulatory Visit (INDEPENDENT_AMBULATORY_CARE_PROVIDER_SITE_OTHER): Payer: Medicare Other | Admitting: Internal Medicine

## 2017-06-19 ENCOUNTER — Encounter: Payer: Self-pay | Admitting: Internal Medicine

## 2017-06-19 VITALS — BP 142/76 | HR 68 | Temp 97.8°F | Ht 67.0 in | Wt 137.0 lb

## 2017-06-19 DIAGNOSIS — F329 Major depressive disorder, single episode, unspecified: Secondary | ICD-10-CM | POA: Diagnosis not present

## 2017-06-19 DIAGNOSIS — E538 Deficiency of other specified B group vitamins: Secondary | ICD-10-CM

## 2017-06-19 DIAGNOSIS — S0990XD Unspecified injury of head, subsequent encounter: Secondary | ICD-10-CM | POA: Diagnosis not present

## 2017-06-19 DIAGNOSIS — F32A Depression, unspecified: Secondary | ICD-10-CM

## 2017-06-19 DIAGNOSIS — R001 Bradycardia, unspecified: Secondary | ICD-10-CM

## 2017-06-19 DIAGNOSIS — I10 Essential (primary) hypertension: Secondary | ICD-10-CM | POA: Diagnosis not present

## 2017-06-19 MED ORDER — CYANOCOBALAMIN 1000 MCG/ML IJ SOLN
1000.0000 ug | Freq: Once | INTRAMUSCULAR | Status: AC
  Administered 2017-06-19: 1000 ug via INTRAMUSCULAR

## 2017-06-19 MED ORDER — HYDRALAZINE HCL 25 MG PO TABS: 25.0000 mg | ORAL_TABLET | Freq: Three times a day (TID) | ORAL | 3 refills | Status: DC

## 2017-06-19 MED ORDER — CITALOPRAM HYDROBROMIDE 20 MG PO TABS: 20.0000 mg | ORAL_TABLET | Freq: Every day | ORAL | 3 refills | Status: DC

## 2017-06-19 MED ORDER — PRAMIPEXOLE DIHYDROCHLORIDE 0.5 MG PO TABS: 0.5000 mg | ORAL_TABLET | Freq: Three times a day (TID) | ORAL | 3 refills | Status: DC

## 2017-06-19 NOTE — Patient Instructions (Addendum)
We changed the mirapex and the celexa to the doses where you do not have to cut the pills  You had the B12 shot today  Your stitches were removed today  Please continue all other medications as before, and remember to take all medicaitons including the hydralazine as prescrbed  Please have the pharmacy call with any other refills you may need.  Please continue your efforts at being more active, low cholesterol diet, and weight control.  You are otherwise up to date with prevention measures today.  Please keep your appointments with your specialists as you may have planned

## 2017-06-19 NOTE — Progress Notes (Signed)
Subjective:    Patient ID: Victor Davis, male    DOB: Jan 21, 1941, 76 y.o.   MRN: [id number]  HPI  76 year old male with history of anemia, coronary artery disease with previous angioplasty and stent placement, dementia, recurrent falls, non-small cell lung cancer and prostate cancer, hypertension and hyperlipidemia presented with presumed mechanical fall at home. He was found to have bradycardia. Cardiology was consulted. Clonidine was held. Heart rate improved. PT recommended home health PT.  No further falls since d/c.   Needs stitches out left upper forehead after laceration with fall.  Due for monthly B12 shot month #3 since restart after had gotten away from injections and found low again.  Wife states will require injections as he has not improved in the past with oral.   Not taking the new hydralazine tid due to forgetting, maybe takes 1 or 2 per day.  Wife tries to assist but is herself infirm. BP Readings from Last 3 Encounters:  06/19/17 (!) 142/76  06/10/17 140/80  06/07/17 (!) 151/103   Wt Readings from Last 3 Encounters:  06/19/17 137 lb (62.1 kg)  06/10/17 132 lb 9.6 oz (60.1 kg)  05/30/17 141 lb 1.6 oz (64 kg)  Wife notes urology plans to start CMT for met prostate ca, to take with prednisone, per Dr Tammi Klippel but not yet started.  Dementia overall stable symptomatically with gradual worsening at best, and not assoc with behavioral changes such as hallucinations, paranoia, or agitation.  Denies worsening depressive symptoms, suicidal ideation, or panic; has ongoing anxiety, not increased recently, in fact has seemed to improve, wife asks if celexa can be reduced.   Past Medical History:  Diagnosis Date  . BRADYCARDIA 10/24/2010  . CALF PAIN, RIGHT 10/22/2009  . Cancer ([place])    lung and prostate cancer per wife  . 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 CKD (chronic kidney disease), stage II   . CKD (chronic kidney disease), stage III ([place]) 02/04/2016  . Coronary artery disease      95% followed by 80% mid LAD stenosis, circumflex 80% stenosis. There was a moderate size branching obtuse marginal with 90-95% stenosis in the inferior branch. The right coronary artery had proximal 30% stenosis. Posterior lateral was moderate-sized with 40% stenosis. The EF was 65-70%. He had a DES and  x 2 to the LAD and DES x 2 to the circumflex 03/08/12.    . Dementia   . Dysuria 10/10/2010  . Emphysema 04/01/2012   By CT chest, august 2013  . ERECTILE DYSFUNCTION 04/05/2007  . FATIGUE 10/02/2007  . GERD (gastroesophageal reflux disease)   . GLUCOSE INTOLERANCE 07/15/2008  . HYPERLIPIDEMIA 07/30/2008  . HYPERSOMNIA 10/02/2007  . Impaired glucose tolerance 02/09/2011  . INSOMNIA-SLEEP DISORDER-UNSPEC 10/22/2009  . LOW BACK PAIN 04/05/2007  . OSTEOARTHRITIS 04/05/2007  . PEPTIC ULCER DISEASE 04/05/2007  . PERIPHERAL EDEMA 10/22/2009  . PERIPHERAL VASCULAR DISEASE 07/30/2008  . PROSTATE CANCER, HX OF 04/05/2007  . RESTLESS LEG SYNDROME 05/15/2007  . RHINITIS, ALLERGIC NOS 05/15/2007  . SHOULDER PAIN, LEFT 01/22/2008  . SINUSITIS- ACUTE-NOS 05/15/2007  . Unspecified essential hypertension 04/05/2007  . Unspecified visual loss 07/15/2008  . UTI 10/24/2010   Past Surgical History:  Procedure Laterality Date  . CARDIAC CATHETERIZATION    . EYE SURGERY     right  . Inguinal herniorrhapy left  2002   x 2  . KNEE SURGERY     left  . Left hip replacement    . PROSTATECTOMY  2002  radical  . Right hip replacement  09/22/09  . ROTATOR CUFF REPAIR     left  . s/p ventral surgery  2009    reports that he quit smoking about 40 years ago. His smoking use included cigarettes. He has a 15.00 pack-year smoking history. he has never used smokeless tobacco. He reports that he does not drink alcohol or use drugs. family history includes Heart attack (age of onset: 65) in his father; Heart attack (age of onset: 51) in his brother; Lung cancer in his sister. Allergies  Allergen Reactions  . Amitriptyline  Hcl Other (See Comments)    REACTION: confusion  . Diphenhydramine Hcl Other (See Comments)    Keeps him awake  . Simvastatin Other (See Comments)    REACTION: leg pain  . Tylenol [Acetaminophen] Other (See Comments)    Dr advised pt not to take due to finding a spot on pt's kidney   Current Outpatient Medications on File Prior to Visit  Medication Sig Dispense Refill  . amLODipine (NORVASC) 10 MG tablet Take 1 tablet (10 mg total) by mouth daily. 90 tablet 0  . aspirin 81 MG EC tablet Take 81 mg by mouth daily.      . clopidogrel (PLAVIX) 75 MG tablet TAKE 1 TABLET BY MOUTH  DAILY (Patient taking differently: TAKE 15m BY MOUTH once  DAILY) 90 tablet 2  . donepezil (ARICEPT) 5 MG tablet Take 1 tablet (5 mg total) by mouth at bedtime. 90 tablet 3  . gabapentin (NEURONTIN) 100 MG capsule TAKE 1 CAPSULE BY MOUTH TWO TIMES DAILY (Patient taking differently: TAKE 1052mBY MOUTH TWO TIMES DAILY) 180 capsule 0  . isosorbide mononitrate (IMDUR) 30 MG 24 hr tablet Take 1 tablet (30 mg total) by mouth daily. 90 tablet 0  . lovastatin (MEVACOR) 40 MG tablet TAKE 1 TABLET BY MOUTH AT  BEDTIME (Patient taking differently: TAKE 4072mY MOUTH AT  BEDTIME) 90 tablet 2  . meclizine (ANTIVERT) 25 MG tablet Take 0.5-1 tablets (12.5-25 mg total) by mouth 3 (three) times daily as needed for dizziness. 30 tablet 0  . NITROSTAT 0.4 MG SL tablet PLACE 1 TABLET (0.4 MG TOTAL) UNDER THE TONGUE EVERY 5 (FIVE) MINUTES AS NEEDED FOR CHEST PAIN. 25 tablet 5  . pantoprazole (PROTONIX) 40 MG tablet Take 1 tablet (40 mg total) by mouth daily. 90 tablet 0   No current facility-administered medications on file prior to visit.    Review of Systems  Constitutional: Negative for other unusual diaphoresis or sweats HENT: Negative for ear discharge or swelling Eyes: Negative for other worsening visual disturbances Respiratory: Negative for stridor or other swelling  Gastrointestinal: Negative for worsening distension or other  blood Genitourinary: Negative for retention or other urinary change Musculoskeletal: Negative for other MSK pain or swelling Skin: Negative for color change or other new lesions Neurological: Negative for worsening tremors and other numbness  Psychiatric/Behavioral: Negative for worsening agitation or other fatigue All other system neg per pt    Objective:   Physical Exam BP (!) 142/76   Pulse 68   Temp 97.8 F (36.6 C) (Oral)   Ht '5\' 7"'  (1.702 m)   Wt 137 lb (62.1 kg)   SpO2 96%   BMI 21.46 kg/m  VS noted,  Constitutional: Pt appears in NAD HENT: Head: NCAT.  Right Ear: External ear normal.  Left Ear: External ear normal.  Eyes: . Pupils are equal, round, and reactive to light. Conjunctivae and EOM are normal Nose: without  d/c or deformity Neck: Neck supple. Gross normal ROM Cardiovascular: Normal rate and regular rhythm.   Pulmonary/Chest: Effort normal and breath sounds without rales or wheezing.  Neurological: Pt is alert. At baseline orientation, motor grossly intact Skin: Skin is warm. No rashes, other new lesions, no LE edema, multiple stitches removed from left upper forehead laceration now intact and healing without s/s infection Psychiatric: Pt behavior is normal without agitation  No other exam findings    Assessment & Plan:

## 2017-06-20 DIAGNOSIS — F329 Major depressive disorder, single episode, unspecified: Secondary | ICD-10-CM | POA: Insufficient documentation

## 2017-06-20 DIAGNOSIS — F32A Depression, unspecified: Secondary | ICD-10-CM | POA: Insufficient documentation

## 2017-06-20 NOTE — Assessment & Plan Note (Signed)
Pt with dementia and has not been able to take the tid hydralazine well this week, wife states will help ensure better compliance

## 2017-06-20 NOTE — Assessment & Plan Note (Signed)
Ok to reduce the celexa to 20 qd, cont to monitor

## 2017-06-20 NOTE — Assessment & Plan Note (Signed)
Improved, stitches removed, no further eval or tx needed at this time

## 2017-06-20 NOTE — Assessment & Plan Note (Signed)
For b12 IM today as planned,  to f/u any worsening symptoms or concerns

## 2017-06-20 NOTE — Assessment & Plan Note (Signed)
Resolved, cont to monitor, cont Home PT

## 2017-06-20 NOTE — Assessment & Plan Note (Signed)
Resolved, ok to remain off clonidine

## 2017-06-22 ENCOUNTER — Encounter: Payer: Self-pay | Admitting: Physician Assistant

## 2017-06-22 NOTE — Progress Notes (Signed)
Cardiology Office Note    Date:  06/25/2017  ID:  JAWON DIPIERO, DOB Apr 18, 1941, MRN [id number] PCP:  Biagio Borg, MD  Cardiologist:  Dr. Angelena Form   Chief Complaint: f/u low HR  History of Present Illness:  TYMEER VAQUERA is a 76 y.o. male with history of CAD (DES to LAD and Cx 02/2012), mild carotid artery disease (1-39% 11/2016), anemia, CKD III, CVA, hypertension, hyperlipidemia, lung cancer, prostate cancer, dementia, bradycardia who presents for follow-up.  To recap history aside from above, in 2016, he was evaluated for possible syncopal event. Cardiac event monitor showed rare PVCs but no other arrhythmias and no high-grade AV block. He was doing well at f/u in 11/2016. However in 05/2017 he was admitted after sustaining a mechanical fall at home with subsequent laceration to his forehead. He reports h/o dizziness but not specific to this event. He had noted prior HR in the 40s. During admission he was noted to have anemia down to 8.3/thrombocytopenia, AKI on suspected CKD (1.8, baseline 1.5-1.6). Cardiology was asked to eval due to HR in the 40s-60s. He was on clonidine and Aricept at the time but no other AVN blocking agents. 2D echo 05/2017 showed mild LVH, EF 60-65%. no RWMA, mild AI/MR. Clonidine was stopped, but Dr. Sallyanne Kuster did not feel his falls were related to bradycardia. Further management of his anemia was recommended (chronic appearing in the 9-10 range since 2016). Recent TSH 03/2017 wnl.  He returns for follow-up today with his wife. Overall he feels significantly better off the clonidine. No further falls or dizziness. More steady on feet. No CP. Has chronic DOE but no significant abrupt change lately - quite sedentary. Has cane, but tends not to use it. No h/o syncope.   Past Medical History:  Diagnosis Date  . Cancer ([place])    lung and prostate cancer per wife  . 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 CKD (chronic kidney disease), stage III ([place])   . Coronary artery  disease    a. DES to LAD and Cx 02/2012.  [name] Dementia   . Dysuria 10/10/2010  . Emphysema 04/01/2012   By CT chest, august 2013  . ERECTILE DYSFUNCTION 04/05/2007  . GERD (gastroesophageal reflux disease)   . HYPERLIPIDEMIA 07/30/2008  . HYPERSOMNIA 10/02/2007  . Impaired glucose tolerance 02/09/2011  . INSOMNIA-SLEEP DISORDER-UNSPEC 10/22/2009  . LOW BACK PAIN 04/05/2007  . Mild carotid artery disease ([place])    a. 1-39% bilaterally by duplex 11/2016.  . OSTEOARTHRITIS 04/05/2007  . PEPTIC ULCER DISEASE 04/05/2007  . PERIPHERAL EDEMA 10/22/2009  . PROSTATE CANCER, HX OF 04/05/2007  . RESTLESS LEG SYNDROME 05/15/2007  . RHINITIS, ALLERGIC NOS 05/15/2007  . Sinus bradycardia 10/24/2010   a. prior h/o HR 40s, clonidine stopped 05/2017 due to this.  [name] Unspecified visual loss 07/15/2008  . UTI 10/24/2010    Past Surgical History:  Procedure Laterality Date  . CARDIAC CATHETERIZATION    . EYE SURGERY     right  . Inguinal herniorrhapy left  2002   x 2  . KNEE SURGERY     left  . Left hip replacement    . PROSTATECTOMY  2002   radical  . Right hip replacement  09/22/09  . ROTATOR CUFF REPAIR     left  . s/p ventral surgery  2009    Current Medications: Current Meds  Medication Sig  . abiraterone Acetate (ZYTIGA) 250 MG tablet Take 500 mg 2 (two) times daily by mouth. Take on  an empty stomach 1 hour before or 2 hours after a meal  . amLODipine (NORVASC) 10 MG tablet Take 10 mg daily by mouth.  [name] aspirin 81 MG chewable tablet Chew 81 mg daily by mouth.  . citalopram (CELEXA) 40 MG tablet Take 20 mg daily by mouth.  . clopidogrel (PLAVIX) 75 MG tablet Take 75 mg daily by mouth.   . donepezil (ARICEPT) 5 MG tablet TAKE 1 TABLET BY MOUTH EVERYDAY AT BEDTIME  . fenoprofen (NALFON) 600 MG TABS tablet Take 600 mg by mouth.  . gabapentin (NEURONTIN) 100 MG capsule Take 100 mg 2 (two) times daily by mouth.   . hydrALAZINE (APRESOLINE) 25 MG tablet TAKE 1 TABLET (25 MG TOTAL) BY MOUTH EVERY 8  (EIGHT) HOURS.  . isosorbide mononitrate (IMDUR) 30 MG 24 hr tablet Take 30 mg daily by mouth.  . lovastatin (MEVACOR) 40 MG tablet Take 40 mg at bedtime by mouth.   . meclizine (ANTIVERT) 25 MG tablet TAKE 1/2 - 1 TABLET BY MOUTH THREE TIMES A DAY AS NEEDED FOR DIZINESS  . nitroGLYCERIN (NITROSTAT) 0.4 MG SL tablet Place 0.4 mg every 5 (five) minutes as needed under the tongue for chest pain.  . pantoprazole (PROTONIX) 40 MG tablet Take 40 mg daily by mouth.  . pramipexole (MIRAPEX) 1 MG tablet Take 0.5 mg 2 (two) times daily by mouth.  . predniSONE (DELTASONE) 5 MG tablet Take 5 mg daily with breakfast by mouth.  . [DISCONTINUED] amLODipine (NORVASC) 10 MG tablet Take 1 tablet (10 mg total) by mouth daily.  . [DISCONTINUED] aspirin 81 MG EC tablet Take 81 mg by mouth daily.    . [DISCONTINUED] citalopram (CELEXA) 20 MG tablet Take 1 tablet (20 mg total) daily by mouth.  . [DISCONTINUED] clopidogrel (PLAVIX) 75 MG tablet Take 75 mg daily by mouth.  . [DISCONTINUED] donepezil (ARICEPT) 5 MG tablet Take 1 tablet (5 mg total) by mouth at bedtime.  . [DISCONTINUED] gabapentin (NEURONTIN) 100 MG capsule Take 100 mg 2 (two) times daily by mouth.  . [DISCONTINUED] hydrALAZINE (APRESOLINE) 25 MG tablet Take 1 tablet (25 mg total) 3 (three) times daily by mouth.  . [DISCONTINUED] isosorbide mononitrate (IMDUR) 30 MG 24 hr tablet Take 1 tablet (30 mg total) by mouth daily.  . [DISCONTINUED] lovastatin (MEVACOR) 40 MG tablet Take 40 mg at bedtime by mouth.  . [DISCONTINUED] meclizine (ANTIVERT) 25 MG tablet Take 0.5-1 tablets (12.5-25 mg total) by mouth 3 (three) times daily as needed for dizziness.  . [DISCONTINUED] NITROSTAT 0.4 MG SL tablet PLACE 1 TABLET (0.4 MG TOTAL) UNDER THE TONGUE EVERY 5 (FIVE) MINUTES AS NEEDED FOR CHEST PAIN.  . [DISCONTINUED] pantoprazole (PROTONIX) 40 MG tablet Take 1 tablet (40 mg total) by mouth daily.  . [DISCONTINUED] pramipexole (MIRAPEX) 0.5 MG tablet Take 1 tablet (0.5  mg total) 3 (three) times daily by mouth.     Allergies:   Amitriptyline hcl; Diphenhydramine hcl; Simvastatin; and Tylenol [acetaminophen]   Social History   Socioeconomic History  . Marital status: Married    Spouse name: None  . Number of children: 2  . Years of education: None  . Highest education level: None  Social Needs  . Financial resource strain: None  . Food insecurity - worry: None  . Food insecurity - inability: None  . Transportation needs - medical: None  . Transportation needs - non-medical: None  Occupational History  . Occupation: truck driver-retired  Tobacco Use  . Smoking status: Former Smoker  Packs/day: 1.00    Years: 15.00    Pack years: 15.00    Types: Cigarettes    Last attempt to quit: 02/05/1977    Years since quitting: 40.4  . Smokeless tobacco: Never Used  Substance and Sexual Activity  . Alcohol use: No    Alcohol/week: 0.5 oz    Types: 1 Standard drinks or equivalent per week    Comment: RARE  . Drug use: No  . Sexual activity: Not Currently  Other Topics Concern  . None  Social History Narrative  . None     Family History:  Family History  Problem Relation Age of Onset  . Heart attack Father 78       Smoker  . Heart attack Brother 64  . Lung cancer Sister   . Colon cancer Neg Hx     ROS:   Please see the history of present illness. All other systems are reviewed and otherwise negative.    PHYSICAL EXAM:   VS:  BP (!) 154/70   Pulse 69   Ht 5\' 7"  (1.702 m)   Wt 138 lb 3.2 oz (62.7 kg)   SpO2 98%   BMI 21.65 kg/m   BMI: Body mass index is 21.65 kg/m. GEN: Well nourished, well developed elderly WM, in no acute distress  HEENT: normocephalic, atraumatic Neck: no JVD, carotid bruits, or masses Cardiac: RRR; no murmurs, rubs, or gallops, no edema  Respiratory:  clear to auscultation bilaterally, normal work of breathing GI: soft, nontender, nondistended, + BS MS: no deformity or atrophy  Skin: warm and dry, no  rash Neuro:  Alert and Oriented x 3, Strength and sensation are intact, follows commands Psych: euthymic mood, full affect  Wt Readings from Last 3 Encounters:  06/25/17 138 lb 3.2 oz (62.7 kg)  06/19/17 137 lb (62.1 kg)  06/10/17 132 lb 9.6 oz (60.1 kg)      Studies/Labs Reviewed:   EKG:  EKG was not ordered today  Recent Labs: 04/11/2017: TSH 1.75 05/08/2017: ALT <6 06/10/2017: BUN 33; Creatinine, Ser 1.80; Hemoglobin 9.7; Magnesium 2.5; Platelets 136; Potassium 4.1; Sodium 136   Lipid Panel    Component Value Date/Time   CHOL 117 04/11/2017 1013   TRIG 67.0 04/11/2017 1013   HDL 42.70 04/11/2017 1013   CHOLHDL 3 04/11/2017 1013   VLDL 13.4 04/11/2017 1013   LDLCALC 61 04/11/2017 1013   LDLDIRECT 104.9 02/10/2011 1224    Additional studies/ records that were reviewed today include: Summarized above.    ASSESSMENT & PLAN:   1. Sinus bradycardia - improved - although his falls were not clearly felt to be related to his heart rate, he is feeling much better off clonidine without further dizziness or unsteadiness. We discussed monitoring for recurrent symptoms. Also discussed use of assistive ambulation device for safety purposes. Continue to monitor. 2. CAD - no recent angina. He is maintained on ASA, Plavix, and Imdur. I suspect his DAPT may also be in part due to prior history of CVA. If he develops recurrent falling, need to rethink appropriateness for continuation of Plavix. I have asked him and his wife to keep [place] informed. 3. Mild carotid disease - last duplex 11/2016 showed mild disease with recommendation to repeat one year. This would fall around 11/2017. Will have the patient f/u with Dr. Angelena Form in 3 months at which time this should be scheduled if still appropriate at that time. 4. HTN - BP running mildly elevated today but he just took his  BP medicines right before arrival. Given h/o falls, may need to be a little more relaxed with BP goal parameters. Asked patient  to follow BP at home and call if BP is tending to run >628 systolic to follow trend.  Disposition: F/u with Dr. Angelena Form in 3 months. Also asked patient to f/u PCP for continued tx of anemia, CKD.   Medication Adjustments/Labs and Tests Ordered: Current medicines are reviewed at length with the patient today.  Concerns regarding medicines are outlined above. Medication changes, Labs and Tests ordered today are summarized above and listed in the Patient Instructions accessible in Encounters.   Signed, Charlie Pitter, PA-C  06/25/2017 8:27 AM    [address], [place], [place]  [address] Phone: [phone]; Fax: [phone]

## 2017-06-25 ENCOUNTER — Ambulatory Visit (INDEPENDENT_AMBULATORY_CARE_PROVIDER_SITE_OTHER): Payer: Medicare Other | Admitting: Physician Assistant

## 2017-06-25 ENCOUNTER — Encounter: Payer: Self-pay | Admitting: Physician Assistant

## 2017-06-25 VITALS — BP 154/70 | HR 69 | Ht 67.0 in | Wt 138.2 lb

## 2017-06-25 DIAGNOSIS — I779 Disorder of arteries and arterioles, unspecified: Secondary | ICD-10-CM | POA: Diagnosis not present

## 2017-06-25 DIAGNOSIS — R001 Bradycardia, unspecified: Secondary | ICD-10-CM

## 2017-06-25 DIAGNOSIS — I739 Peripheral vascular disease, unspecified: Secondary | ICD-10-CM

## 2017-06-25 DIAGNOSIS — I2510 Atherosclerotic heart disease of native coronary artery without angina pectoris: Secondary | ICD-10-CM | POA: Diagnosis not present

## 2017-06-25 DIAGNOSIS — I10 Essential (primary) hypertension: Secondary | ICD-10-CM

## 2017-06-25 NOTE — Patient Instructions (Signed)
Medication Instructions:  Your physician recommends that you continue on your current medications as directed. Please refer to the Current Medication list given to you today.   Labwork: None ordered  Testing/Procedures: None ordered  Follow-Up: Your physician recommends that you schedule a follow-up appointment in: 3 MONTHS WITH DR. Angelena Form    Any Other Special Instructions Will Be Listed Below (If Applicable).     If you need a refill on your cardiac medications before your next appointment, please call your pharmacy.

## 2017-07-20 ENCOUNTER — Ambulatory Visit (INDEPENDENT_AMBULATORY_CARE_PROVIDER_SITE_OTHER): Payer: Medicare Other

## 2017-07-20 DIAGNOSIS — E538 Deficiency of other specified B group vitamins: Secondary | ICD-10-CM | POA: Diagnosis not present

## 2017-07-20 MED ORDER — CYANOCOBALAMIN 1000 MCG/ML IJ SOLN
1000.0000 ug | Freq: Once | INTRAMUSCULAR | Status: AC
  Administered 2017-07-20: 1000 ug via INTRAMUSCULAR

## 2017-07-20 NOTE — Progress Notes (Signed)
Medical screening examination/treatment/procedure(s) were performed by non-physician practitioner and as supervising physician I was immediately available for consultation/collaboration. I agree with above. Calyx Hawker, MD   

## 2017-08-04 ENCOUNTER — Other Ambulatory Visit: Payer: Self-pay | Admitting: Internal Medicine

## 2017-08-17 ENCOUNTER — Other Ambulatory Visit: Payer: Self-pay | Admitting: Internal Medicine

## 2017-08-21 ENCOUNTER — Ambulatory Visit (INDEPENDENT_AMBULATORY_CARE_PROVIDER_SITE_OTHER): Payer: Medicare Other | Admitting: *Deleted

## 2017-08-21 DIAGNOSIS — E538 Deficiency of other specified B group vitamins: Secondary | ICD-10-CM | POA: Diagnosis not present

## 2017-08-21 MED ORDER — CYANOCOBALAMIN 1000 MCG/ML IJ SOLN
1000.0000 ug | Freq: Once | INTRAMUSCULAR | Status: AC
  Administered 2017-08-21: 1000 ug via INTRAMUSCULAR

## 2017-09-04 DIAGNOSIS — C61 Malignant neoplasm of prostate: Secondary | ICD-10-CM | POA: Diagnosis not present

## 2017-09-11 DIAGNOSIS — N183 Chronic kidney disease, stage 3 (moderate): Secondary | ICD-10-CM | POA: Diagnosis not present

## 2017-09-11 DIAGNOSIS — C61 Malignant neoplasm of prostate: Secondary | ICD-10-CM | POA: Diagnosis not present

## 2017-09-11 DIAGNOSIS — Z5111 Encounter for antineoplastic chemotherapy: Secondary | ICD-10-CM | POA: Diagnosis not present

## 2017-09-21 ENCOUNTER — Ambulatory Visit (INDEPENDENT_AMBULATORY_CARE_PROVIDER_SITE_OTHER): Payer: Medicare Other

## 2017-09-21 DIAGNOSIS — E538 Deficiency of other specified B group vitamins: Secondary | ICD-10-CM | POA: Diagnosis not present

## 2017-09-21 MED ORDER — CYANOCOBALAMIN 1000 MCG/ML IJ SOLN
1000.0000 ug | Freq: Once | INTRAMUSCULAR | Status: AC
  Administered 2017-09-21: 1000 ug via INTRAMUSCULAR

## 2017-09-21 NOTE — Progress Notes (Signed)
Medical screening examination/treatment/procedure(s) were performed by non-physician practitioner and as supervising physician I was immediately available for consultation/collaboration. I agree with above. Vendela Troung, MD   

## 2017-10-01 ENCOUNTER — Encounter: Payer: Self-pay | Admitting: Cardiovascular Disease

## 2017-10-01 ENCOUNTER — Ambulatory Visit (INDEPENDENT_AMBULATORY_CARE_PROVIDER_SITE_OTHER): Payer: Medicare Other | Admitting: Cardiovascular Disease

## 2017-10-01 VITALS — BP 160/78 | HR 60 | Ht 67.0 in | Wt 134.0 lb

## 2017-10-01 DIAGNOSIS — I6523 Occlusion and stenosis of bilateral carotid arteries: Secondary | ICD-10-CM | POA: Diagnosis not present

## 2017-10-01 DIAGNOSIS — I2510 Atherosclerotic heart disease of native coronary artery without angina pectoris: Secondary | ICD-10-CM | POA: Diagnosis not present

## 2017-10-01 DIAGNOSIS — E7800 Pure hypercholesterolemia, unspecified: Secondary | ICD-10-CM | POA: Diagnosis not present

## 2017-10-01 DIAGNOSIS — I10 Essential (primary) hypertension: Secondary | ICD-10-CM | POA: Diagnosis not present

## 2017-10-01 MED ORDER — NITROGLYCERIN 0.4 MG SL SUBL: 0.4000 mg | SUBLINGUAL_TABLET | SUBLINGUAL | 3 refills | Status: DC | PRN

## 2017-10-01 NOTE — Patient Instructions (Signed)
Medication Instructions:  Your physician recommends that you continue on your current medications as directed. Please refer to the Current Medication list given to you today.   Labwork: None ordered  Testing/Procedures: None ordered  Follow-Up: Your physician wants you to follow-up in: 1 year with Dr. Angelena Form. You will receive a reminder letter in the mail two months in advance. If you don't receive a letter, please call our office to schedule the follow-up appointment.   Any Other Special Instructions Will Be Listed Below (If Applicable).     If you need a refill on your cardiac medications before your next appointment, please call your pharmacy.

## 2017-10-01 NOTE — Progress Notes (Signed)
Chief Complaint  Patient presents with  . Follow-up    CAD     History of Present Illness: 77 yo male with history of CAD, CVA, HTN, HLD, PAD, prostate CA, lung cancer, sinus bradycardia, anemia, CKD and dementia who is here today for cardiac follow up. Cardiac cath July 2013 with severe LAD and Circumflex disease. I placed 2 drug eluting stent in the LAD and 2 drug eluting stents in the Circumflex. Admitted to [facility] August 2016 for resection of right lung adenocarcinoma. He is known to have moderate carotid artery disease by dopplers April 2018. Possible syncopal event October 2016. Cardiac event monitor showed rare PVCs but no other arrhythmias and no high grade AV block. Echo October 2018 with normal LV function, mild AI, mild MR.   He is here today for follow up. The patient denies any chest pain, dyspnea, palpitations, lower extremity edema, orthopnea, PND, dizziness, near syncope or syncope.    Primary Care Physician: Biagio Borg, MD  Past Medical History:  Diagnosis Date  . Cancer ([place])    lung and prostate cancer per wife  . 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 CKD (chronic kidney disease), stage III ([place])   . Coronary artery disease    a. DES to LAD and Cx 02/2012.  [name] Dementia   . Dysuria 10/10/2010  . Emphysema 04/01/2012   By CT chest, august 2013  . ERECTILE DYSFUNCTION 04/05/2007  . GERD (gastroesophageal reflux disease)   . HYPERLIPIDEMIA 07/30/2008  . HYPERSOMNIA 10/02/2007  . Impaired glucose tolerance 02/09/2011  . INSOMNIA-SLEEP DISORDER-UNSPEC 10/22/2009  . LOW BACK PAIN 04/05/2007  . Mild carotid artery disease ([place])    a. 1-39% bilaterally by duplex 11/2016.  . OSTEOARTHRITIS 04/05/2007  . PEPTIC ULCER DISEASE 04/05/2007  . PERIPHERAL EDEMA 10/22/2009  . PROSTATE CANCER, HX OF 04/05/2007  . RESTLESS LEG SYNDROME 05/15/2007  . RHINITIS, ALLERGIC NOS 05/15/2007  . Sinus bradycardia 10/24/2010   a. prior h/o HR 40s, clonidine stopped 05/2017 due to this.  [name]  Unspecified visual loss 07/15/2008  . UTI 10/24/2010    Past Surgical History:  Procedure Laterality Date  . CARDIAC CATHETERIZATION    . EYE SURGERY     right  . Inguinal herniorrhapy left  2002   x 2  . KNEE SURGERY     left  . Left hip replacement    . LYMPH NODE DISSECTION Right 03/22/2015   Procedure: LYMPH NODE DISSECTION;  Surgeon: Melrose Nakayama, MD;  Location: [place];  Service: Thoracic;  Laterality: Right;  . PERCUTANEOUS CORONARY STENT INTERVENTION (PCI-S) N/A 03/08/2012   Procedure: PERCUTANEOUS CORONARY STENT INTERVENTION (PCI-S);  Surgeon: Burnell Blanks, MD;  Location: [facility] CATH LAB;  Service: Cardiovascular;  Laterality: N/A;  . PROSTATECTOMY  2002   radical  . Right hip replacement  09/22/09  . ROTATOR CUFF REPAIR     left  . s/p ventral surgery  2009  . SEGMENTECOMY Right 03/22/2015   Procedure: RIGHT LOWER LOBE SUPERIOR SEGMENTECTOMY;  Surgeon: Melrose Nakayama, MD;  Location: [place];  Service: Thoracic;  Laterality: Right;  [name] VIDEO ASSISTED THORACOSCOPY Right 03/22/2015   Procedure: RIGHT VIDEO ASSISTED THORACOSCOPY;  Surgeon: Melrose Nakayama, MD;  Location: [place];  Service: Thoracic;  Laterality: Right;    Current Outpatient Medications  Medication Sig Dispense Refill  . abiraterone Acetate (ZYTIGA) 250 MG tablet Take 500 mg 2 (two) times daily by mouth. Take on an empty stomach 1 hour before or 2  hours after a meal    . aspirin 81 MG chewable tablet Chew 81 mg daily by mouth.    . citalopram (CELEXA) 40 MG tablet Take 20 mg daily by mouth.    . clopidogrel (PLAVIX) 75 MG tablet Take 75 mg daily by mouth.     . donepezil (ARICEPT) 5 MG tablet TAKE 1 TABLET BY MOUTH EVERYDAY AT BEDTIME  3  . fenoprofen (NALFON) 600 MG TABS tablet Take 600 mg by mouth.    . gabapentin (NEURONTIN) 100 MG capsule TAKE 1 CAPSULE BY MOUTH TWO TIMES DAILY 180 capsule 1  . hydrALAZINE (APRESOLINE) 25 MG tablet TAKE 1 TABLET (25 MG TOTAL) BY MOUTH EVERY 8 (EIGHT) HOURS.  0    . isosorbide mononitrate (IMDUR) 30 MG 24 hr tablet Take 30 mg daily by mouth.    . lovastatin (MEVACOR) 40 MG tablet Take 40 mg at bedtime by mouth.     . meclizine (ANTIVERT) 25 MG tablet TAKE 1/2 - 1 TABLET BY MOUTH THREE TIMES A DAY AS NEEDED FOR DIZINESS    . nitroGLYCERIN (NITROSTAT) 0.4 MG SL tablet Place 1 tablet (0.4 mg total) under the tongue every 5 (five) minutes as needed for chest pain. 25 tablet 3  . pantoprazole (PROTONIX) 40 MG tablet Take 20 mg by mouth daily.     . pramipexole (MIRAPEX) 1 MG tablet Take 0.5 mg 2 (two) times daily by mouth.    . predniSONE (DELTASONE) 5 MG tablet Take 5 mg daily with breakfast by mouth.    [name] amLODipine (NORVASC) 10 MG tablet Take 10 mg daily by mouth.     No current facility-administered medications for this visit.     Allergies  Allergen Reactions  . Amitriptyline Hcl Other (See Comments)    REACTION: confusion  . Diphenhydramine Hcl Other (See Comments)    Keeps him awake  . Simvastatin Other (See Comments)    REACTION: leg pain  . Clonidine Derivatives     Dizziness, falls, bradycardia  . Tylenol [Acetaminophen] Other (See Comments)    Dr advised pt not to take due to finding a spot on pt's kidney    Social History   Socioeconomic History  . Marital status: Married    Spouse name: Not on file  . Number of children: 2  . Years of education: Not on file  . Highest education level: Not on file  Social Needs  . Financial resource strain: Not on file  . Food insecurity - worry: Not on file  . Food insecurity - inability: Not on file  . Transportation needs - medical: Not on file  . Transportation needs - non-medical: Not on file  Occupational History  . Occupation: truck driver-retired  Tobacco Use  . Smoking status: Former Smoker    Packs/day: 1.00    Years: 15.00    Pack years: 15.00    Types: Cigarettes    Last attempt to quit: 02/05/1977    Years since quitting: 40.6  . Smokeless tobacco: Never Used  Substance  and Sexual Activity  . Alcohol use: No    Alcohol/week: 0.5 oz    Types: 1 Standard drinks or equivalent per week    Comment: RARE  . Drug use: No  . Sexual activity: Not Currently  Other Topics Concern  . Not on file  Social History Narrative  . Not on file    Family History  Problem Relation Age of Onset  . Heart attack Father 21  Smoker  . Heart attack Brother 24  . Lung cancer Sister   . Colon cancer Neg Hx     Review of Systems:  As stated in the HPI and otherwise negative.   BP (!) 160/78 (BP Location: Right Arm, Patient Position: Sitting)   Pulse 60   Ht 5\' 7"  (1.702 m)   Wt 134 lb (60.8 kg)   SpO2 99%   BMI 20.99 kg/m   Physical Exam:   General: Well developed, well nourished, NAD  HEENT: OP clear, mucus membranes moist  SKIN: warm, dry. No rashes. Neuro: No focal deficits  Musculoskeletal: Muscle strength 5/5 all ext  Psychiatric: Mood and affect normal  Neck: No JVD, no carotid bruits, no thyromegaly, no lymphadenopathy.  Lungs:Clear bilaterally, no wheezes, rhonci, crackles Cardiovascular: Regular rate and rhythm. No murmurs, gallops or rubs. Abdomen:Soft. Bowel sounds present. Non-tender.  Extremities: No lower extremity edema. Pulses are 2 + in the bilateral DP/PT.  Echo October 2018: - Left ventricle: The cavity size was normal. Wall thickness was   increased in a pattern of mild LVH. Systolic function was normal.   The estimated ejection fraction was in the range of 60% to 65%.   Wall motion was normal; there were no regional wall motion   abnormalities. - Aortic valve: There was mild regurgitation. - Mitral valve: Calcified annulus. There was mild regurgitation.  EKG:  EKG is not ordered today. The ekg ordered today demonstrates   Recent Labs: 04/11/2017: TSH 1.75 05/08/2017: ALT <6 06/10/2017: BUN 33; Creatinine, Ser 1.80; Hemoglobin 9.7; Magnesium 2.5; Platelets 136; Potassium 4.1; Sodium 136   Lipid Panel    Component Value  Date/Time   CHOL 117 04/11/2017 1013   TRIG 67.0 04/11/2017 1013   HDL 42.70 04/11/2017 1013   CHOLHDL 3 04/11/2017 1013   VLDL 13.4 04/11/2017 1013   LDLCALC 61 04/11/2017 1013   LDLDIRECT 104.9 02/10/2011 1224     Wt Readings from Last 3 Encounters:  10/01/17 134 lb (60.8 kg)  06/25/17 138 lb 3.2 oz (62.7 kg)  06/19/17 137 lb (62.1 kg)     Other studies Reviewed: Additional studies/ records that were reviewed today include: . Review of the above records demonstrates:   Assessment and Plan:   1. CAD without angina: He has no chest pain suggestive of angina. Will continue ASA, Plavix, statin and Imdur. No beta blocker with braydcardia.   2. HTN: BP is well controlled at home. He has not taken his meds today. No changes.   3. HLD: Lipids well controlled. Continue statin.    4. Carotid artery disease: Bilateral mild disease by dopplers April 2018.    Current medicines are reviewed at length with the patient today.  The patient does not have concerns regarding medicines.  The following changes have been made:  no change  Labs/ tests ordered today include:  No orders of the defined types were placed in this encounter.    Disposition:   FU with me in 12 months   Signed, Lauree Chandler, MD 10/01/2017 9:12 AM    [facility] [address], [place], [place]  [address] Phone: [phone]; Fax: [phone]

## 2017-10-19 ENCOUNTER — Other Ambulatory Visit: Payer: Self-pay | Admitting: Internal Medicine

## 2017-10-19 ENCOUNTER — Ambulatory Visit (INDEPENDENT_AMBULATORY_CARE_PROVIDER_SITE_OTHER): Payer: Medicare Other

## 2017-10-19 DIAGNOSIS — E538 Deficiency of other specified B group vitamins: Secondary | ICD-10-CM

## 2017-10-19 MED ORDER — CYANOCOBALAMIN 1000 MCG/ML IJ SOLN
1000.0000 ug | Freq: Once | INTRAMUSCULAR | Status: AC
  Administered 2017-10-19: 1000 ug via INTRAMUSCULAR

## 2017-10-19 NOTE — Progress Notes (Addendum)
[place] Medical screening examination/treatment/procedure(s) were performed by non-physician practitioner and as supervising physician I was immediately available for consultation/collaboration. I agree with above. James John, MD   

## 2017-10-25 ENCOUNTER — Other Ambulatory Visit: Payer: Self-pay | Admitting: Internal Medicine

## 2017-10-25 NOTE — Telephone Encounter (Signed)
Both done erx

## 2017-10-25 NOTE — Telephone Encounter (Signed)
Routing to dr Jenny Reichmann, i'm not showing dr Jenny Reichmann as prescriber for these medications----patient states he has enough meds to last for awhile---until dr Jenny Reichmann returns to office---dr Jenny Reichmann, please advise if you are ok with refilling these meds, thanks

## 2017-11-13 ENCOUNTER — Ambulatory Visit: Payer: Medicare Other | Admitting: Internal Medicine

## 2017-11-20 ENCOUNTER — Ambulatory Visit: Payer: Medicare Other | Admitting: Internal Medicine

## 2017-11-22 ENCOUNTER — Ambulatory Visit: Payer: Medicare Other | Admitting: Internal Medicine

## 2017-11-22 DIAGNOSIS — Z0289 Encounter for other administrative examinations: Secondary | ICD-10-CM

## 2017-11-23 ENCOUNTER — Ambulatory Visit (INDEPENDENT_AMBULATORY_CARE_PROVIDER_SITE_OTHER): Payer: Medicare Other

## 2017-11-23 DIAGNOSIS — E538 Deficiency of other specified B group vitamins: Secondary | ICD-10-CM

## 2017-11-23 MED ORDER — CYANOCOBALAMIN 1000 MCG/ML IJ SOLN
1000.0000 ug | Freq: Once | INTRAMUSCULAR | Status: AC
  Administered 2017-11-23: 1000 ug via INTRAMUSCULAR

## 2017-11-23 NOTE — Progress Notes (Signed)
Medical screening examination/treatment/procedure(s) were performed by non-physician practitioner and as supervising physician I was immediately available for consultation/collaboration. I agree with above. Jaceon Heiberger, MD   

## 2017-11-26 ENCOUNTER — Telehealth: Payer: Self-pay | Admitting: *Deleted

## 2017-11-26 ENCOUNTER — Observation Stay (HOSPITAL_COMMUNITY)
Admission: EM | Admit: 2017-11-26 | Discharge: 2017-11-28 | Disposition: A | Discharge status: Home / Self Care | Payer: Medicare Other | Attending: Internal Medicine | Admitting: Internal Medicine

## 2017-11-26 ENCOUNTER — Encounter: Payer: Self-pay | Admitting: Internal Medicine

## 2017-11-26 ENCOUNTER — Ambulatory Visit (INDEPENDENT_AMBULATORY_CARE_PROVIDER_SITE_OTHER): Payer: Medicare Other | Admitting: Internal Medicine

## 2017-11-26 ENCOUNTER — Encounter (HOSPITAL_COMMUNITY): Payer: Self-pay

## 2017-11-26 ENCOUNTER — Other Ambulatory Visit (INDEPENDENT_AMBULATORY_CARE_PROVIDER_SITE_OTHER): Payer: Medicare Other

## 2017-11-26 ENCOUNTER — Ambulatory Visit: Payer: Medicare Other | Admitting: Internal Medicine

## 2017-11-26 ENCOUNTER — Emergency Department (HOSPITAL_COMMUNITY): Payer: Medicare Other

## 2017-11-26 ENCOUNTER — Other Ambulatory Visit: Payer: Self-pay

## 2017-11-26 VITALS — BP 142/78 | HR 78 | Temp 98.0°F | Ht 67.0 in | Wt 138.0 lb

## 2017-11-26 DIAGNOSIS — F329 Major depressive disorder, single episode, unspecified: Secondary | ICD-10-CM | POA: Insufficient documentation

## 2017-11-26 DIAGNOSIS — Z0001 Encounter for general adult medical examination with abnormal findings: Secondary | ICD-10-CM

## 2017-11-26 DIAGNOSIS — D649 Anemia, unspecified: Secondary | ICD-10-CM

## 2017-11-26 DIAGNOSIS — F0390 Unspecified dementia without behavioral disturbance: Secondary | ICD-10-CM

## 2017-11-26 DIAGNOSIS — Z87891 Personal history of nicotine dependence: Secondary | ICD-10-CM | POA: Diagnosis not present

## 2017-11-26 DIAGNOSIS — F0150 Vascular dementia without behavioral disturbance: Secondary | ICD-10-CM | POA: Diagnosis not present

## 2017-11-26 DIAGNOSIS — E538 Deficiency of other specified B group vitamins: Secondary | ICD-10-CM

## 2017-11-26 DIAGNOSIS — Z8673 Personal history of transient ischemic attack (TIA), and cerebral infarction without residual deficits: Secondary | ICD-10-CM | POA: Diagnosis not present

## 2017-11-26 DIAGNOSIS — E785 Hyperlipidemia, unspecified: Secondary | ICD-10-CM | POA: Insufficient documentation

## 2017-11-26 DIAGNOSIS — D509 Iron deficiency anemia, unspecified: Secondary | ICD-10-CM | POA: Insufficient documentation

## 2017-11-26 DIAGNOSIS — G2581 Restless legs syndrome: Secondary | ICD-10-CM

## 2017-11-26 DIAGNOSIS — K5520 Angiodysplasia of colon without hemorrhage: Secondary | ICD-10-CM | POA: Diagnosis not present

## 2017-11-26 DIAGNOSIS — R7302 Impaired glucose tolerance (oral): Secondary | ICD-10-CM

## 2017-11-26 DIAGNOSIS — R5383 Other fatigue: Secondary | ICD-10-CM | POA: Diagnosis not present

## 2017-11-26 DIAGNOSIS — K219 Gastro-esophageal reflux disease without esophagitis: Secondary | ICD-10-CM | POA: Diagnosis not present

## 2017-11-26 DIAGNOSIS — Z79899 Other long term (current) drug therapy: Secondary | ICD-10-CM | POA: Diagnosis not present

## 2017-11-26 DIAGNOSIS — F32A Depression, unspecified: Secondary | ICD-10-CM

## 2017-11-26 DIAGNOSIS — I2510 Atherosclerotic heart disease of native coronary artery without angina pectoris: Secondary | ICD-10-CM | POA: Diagnosis not present

## 2017-11-26 DIAGNOSIS — K5730 Diverticulosis of large intestine without perforation or abscess without bleeding: Secondary | ICD-10-CM | POA: Insufficient documentation

## 2017-11-26 DIAGNOSIS — Z85118 Personal history of other malignant neoplasm of bronchus and lung: Secondary | ICD-10-CM | POA: Diagnosis not present

## 2017-11-26 DIAGNOSIS — N183 Chronic kidney disease, stage 3 (moderate): Secondary | ICD-10-CM | POA: Insufficient documentation

## 2017-11-26 DIAGNOSIS — Z8546 Personal history of malignant neoplasm of prostate: Secondary | ICD-10-CM | POA: Diagnosis not present

## 2017-11-26 DIAGNOSIS — K635 Polyp of colon: Secondary | ICD-10-CM | POA: Insufficient documentation

## 2017-11-26 DIAGNOSIS — Z7982 Long term (current) use of aspirin: Secondary | ICD-10-CM | POA: Insufficient documentation

## 2017-11-26 DIAGNOSIS — R531 Weakness: Secondary | ICD-10-CM | POA: Diagnosis not present

## 2017-11-26 DIAGNOSIS — R262 Difficulty in walking, not elsewhere classified: Secondary | ICD-10-CM | POA: Diagnosis not present

## 2017-11-26 DIAGNOSIS — D518 Other vitamin B12 deficiency anemias: Secondary | ICD-10-CM

## 2017-11-26 DIAGNOSIS — I129 Hypertensive chronic kidney disease with stage 1 through stage 4 chronic kidney disease, or unspecified chronic kidney disease: Secondary | ICD-10-CM | POA: Insufficient documentation

## 2017-11-26 DIAGNOSIS — K648 Other hemorrhoids: Secondary | ICD-10-CM | POA: Insufficient documentation

## 2017-11-26 DIAGNOSIS — R0602 Shortness of breath: Secondary | ICD-10-CM | POA: Diagnosis not present

## 2017-11-26 DIAGNOSIS — R0600 Dyspnea, unspecified: Secondary | ICD-10-CM | POA: Diagnosis not present

## 2017-11-26 LAB — COMPREHENSIVE METABOLIC PANEL
ALK PHOS: 78 U/L (ref 38–126)
ALT: 10 U/L — ABNORMAL LOW (ref 17–63)
AST: 17 U/L (ref 15–41)
Albumin: 3.7 g/dL (ref 3.5–5.0)
Anion gap: 9 (ref 5–15)
BUN: 35 mg/dL — ABNORMAL HIGH (ref 6–20)
CALCIUM: 9 mg/dL (ref 8.9–10.3)
CO2: 22 mmol/L (ref 22–32)
Chloride: 106 mmol/L (ref 101–111)
Creatinine, Ser: 1.72 mg/dL — ABNORMAL HIGH (ref 0.61–1.24)
GFR calc Af Amer: 42 mL/min — ABNORMAL LOW (ref >60)
GFR calc non Af Amer: 36 mL/min — ABNORMAL LOW (ref >60)
Glucose, Bld: 114 mg/dL — ABNORMAL HIGH (ref 65–99)
Potassium: 3.6 mmol/L (ref 3.5–5.1)
SODIUM: 137 mmol/L (ref 135–145)
Total Bilirubin: 1.1 mg/dL (ref 0.3–1.2)
Total Protein: 6.8 g/dL (ref 6.5–8.1)

## 2017-11-26 LAB — CBC WITH DIFFERENTIAL/PLATELET
BASOS PCT: 0.5 % (ref 0.0–3.0)
Basophils Absolute: 0 10*3/uL (ref 0.0–0.1)
EOS ABS: 0.1 10*3/uL (ref 0.0–0.7)
Eosinophils Relative: 1.3 % (ref 0.0–5.0)
HCT: 21.3 % — CL (ref 39.0–52.0)
Lymphocytes Relative: 29.8 % (ref 12.0–46.0)
Lymphs Abs: 2 10*3/uL (ref 0.7–4.0)
MCHC: 31.2 g/dL (ref 30.0–36.0)
MCV: 75 fl — ABNORMAL LOW (ref 78.0–100.0)
MONO ABS: 0.7 10*3/uL (ref 0.1–1.0)
Monocytes Relative: 9.9 % (ref 3.0–12.0)
NEUTROS ABS: 3.9 10*3/uL (ref 1.4–7.7)
NEUTROS PCT: 58.5 % (ref 43.0–77.0)
PLATELETS: 168 10*3/uL (ref 150.0–400.0)
RBC: 2.84 Mil/uL — ABNORMAL LOW (ref 4.22–5.81)
RDW: 15.7 % — AB (ref 11.5–15.5)
WBC: 6.7 10*3/uL (ref 4.0–10.5)

## 2017-11-26 LAB — CBC
HCT: 22.9 % — ABNORMAL LOW (ref 39.0–52.0)
Hemoglobin: 6.6 g/dL — CL (ref 13.0–17.0)
MCH: 23.2 pg — AB (ref 26.0–34.0)
MCHC: 28.8 g/dL — ABNORMAL LOW (ref 30.0–36.0)
MCV: 80.4 fL (ref 78.0–100.0)
Platelets: 154 10*3/uL (ref 150–400)
RBC: 2.85 MIL/uL — AB (ref 4.22–5.81)
RDW: 15.3 % (ref 11.5–15.5)
WBC: 5.4 10*3/uL (ref 4.0–10.5)

## 2017-11-26 LAB — HEMOGLOBIN AND HEMATOCRIT, BLOOD
HCT: 23.5 % — ABNORMAL LOW (ref 39.0–52.0)
HEMOGLOBIN: 7.1 g/dL — AB (ref 13.0–17.0)

## 2017-11-26 LAB — IRON AND TIBC
Iron: 17 ug/dL — ABNORMAL LOW (ref 45–182)
Saturation Ratios: 4 % — ABNORMAL LOW (ref 17.9–39.5)
TIBC: 447 ug/dL (ref 250–450)
UIBC: 430 ug/dL

## 2017-11-26 LAB — HEPATIC FUNCTION PANEL
ALT: 6 U/L (ref 0–53)
AST: 11 U/L (ref 0–37)
Albumin: 4 g/dL (ref 3.5–5.2)
Alkaline Phosphatase: 73 U/L (ref 39–117)
BILIRUBIN DIRECT: 0.2 mg/dL (ref 0.0–0.3)
BILIRUBIN TOTAL: 0.9 mg/dL (ref 0.2–1.2)
Total Protein: 6.5 g/dL (ref 6.0–8.3)

## 2017-11-26 LAB — BASIC METABOLIC PANEL
BUN: 34 mg/dL — AB (ref 6–23)
CHLORIDE: 107 meq/L (ref 96–112)
CO2: 22 meq/L (ref 19–32)
Calcium: 9.1 mg/dL (ref 8.4–10.5)
Creatinine, Ser: 1.68 mg/dL — ABNORMAL HIGH (ref 0.40–1.50)
GFR: 42.18 mL/min — ABNORMAL LOW (ref >60.00)
GLUCOSE: 86 mg/dL (ref 70–99)
POTASSIUM: 3.9 meq/L (ref 3.5–5.1)
Sodium: 138 mEq/L (ref 135–145)

## 2017-11-26 LAB — URINALYSIS, ROUTINE W REFLEX MICROSCOPIC
Bacteria, UA: NONE SEEN
Bilirubin Urine: NEGATIVE
Bilirubin Urine: NEGATIVE
GLUCOSE, UA: NEGATIVE mg/dL
HGB URINE DIPSTICK: NEGATIVE
Hgb urine dipstick: NEGATIVE
KETONES UR: NEGATIVE mg/dL
Ketones, ur: NEGATIVE
LEUKOCYTES UA: NEGATIVE
Leukocytes, UA: NEGATIVE
NITRITE: NEGATIVE
Nitrite: NEGATIVE
PH: 6 (ref 5.0–8.0)
PROTEIN: 30 mg/dL — AB
Specific Gravity, Urine: 1.015 (ref 1.000–1.030)
Specific Gravity, Urine: 1.01 (ref 1.005–1.030)
Squamous Epithelial / LPF: NONE SEEN
Total Protein, Urine: 30 — AB
Urine Glucose: NEGATIVE
Urobilinogen, UA: 0.2 (ref 0.0–1.0)
WBC, UA: NONE SEEN WBC/hpf (ref 0–5)
pH: 6 (ref 5.0–8.0)

## 2017-11-26 LAB — ABO/RH: ABO/RH(D): A NEG

## 2017-11-26 LAB — LIPID PANEL
Cholesterol: 90 mg/dL (ref 0–200)
HDL: 41.6 mg/dL (ref >39.00)
LDL CALC: 37 mg/dL (ref 0–99)
NONHDL: 48.42
Total CHOL/HDL Ratio: 2
Triglycerides: 55 mg/dL (ref 0.0–149.0)
VLDL: 11 mg/dL (ref 0.0–40.0)

## 2017-11-26 LAB — PREPARE RBC (CROSSMATCH)

## 2017-11-26 LAB — FERRITIN: FERRITIN: 7 ng/mL — AB (ref 24–336)

## 2017-11-26 LAB — VITAMIN B12
VITAMIN B 12: 1295 pg/mL — AB (ref 211–911)
Vitamin B-12: 913 pg/mL (ref 180–914)

## 2017-11-26 LAB — POC OCCULT BLOOD, ED: Fecal Occult Bld: POSITIVE — AB

## 2017-11-26 LAB — RETICULOCYTES
RBC.: 2.48 MIL/uL — ABNORMAL LOW (ref 4.22–5.81)
RETIC COUNT ABSOLUTE: 54.6 10*3/uL (ref 19.0–186.0)
RETIC CT PCT: 2.2 % (ref 0.4–3.1)

## 2017-11-26 LAB — FOLATE: Folate: 14.1 ng/mL (ref >5.9)

## 2017-11-26 LAB — HEMOGLOBIN A1C: HEMOGLOBIN A1C: 5.5 % (ref 4.6–6.5)

## 2017-11-26 LAB — TSH: TSH: 2.36 u[IU]/mL (ref 0.35–4.50)

## 2017-11-26 MED ORDER — SODIUM CHLORIDE 0.9 % IV SOLN
Freq: Once | INTRAVENOUS | Status: AC
  Administered 2017-11-26: 17:00:00 via INTRAVENOUS

## 2017-11-26 MED ORDER — PREDNISONE 5 MG PO TABS: 5.0000 mg | ORAL_TABLET | Freq: Every day | ORAL | Status: DC

## 2017-11-26 MED ORDER — PREDNISONE 5 MG PO TABS
5.0000 mg | ORAL_TABLET | Freq: Every day | ORAL | Status: DC
  Administered 2017-11-26 – 2017-11-27 (×2): 5 mg via ORAL
  Filled 2017-11-26 (×2): qty 1

## 2017-11-26 MED ORDER — ABIRATERONE ACETATE 250 MG PO TABS
1000.0000 mg | ORAL_TABLET | Freq: Every day | ORAL | Status: DC
  Filled 2017-11-26: qty 4

## 2017-11-26 MED ORDER — PANTOPRAZOLE SODIUM 40 MG PO TBEC
40.0000 mg | DELAYED_RELEASE_TABLET | Freq: Every day | ORAL | Status: DC
  Administered 2017-11-27 – 2017-11-28 (×2): 40 mg via ORAL
  Filled 2017-11-26 (×2): qty 1

## 2017-11-26 MED ORDER — DONEPEZIL HCL 10 MG PO TABS
10.0000 mg | ORAL_TABLET | Freq: Every day | ORAL | Status: DC
  Administered 2017-11-26 – 2017-11-27 (×2): 10 mg via ORAL
  Filled 2017-11-26 (×2): qty 1

## 2017-11-26 MED ORDER — GABAPENTIN 300 MG PO CAPS
300.0000 mg | ORAL_CAPSULE | Freq: Three times a day (TID) | ORAL | Status: DC
  Administered 2017-11-26 – 2017-11-28 (×5): 300 mg via ORAL
  Filled 2017-11-26 (×5): qty 1

## 2017-11-26 MED ORDER — CITALOPRAM HYDROBROMIDE 20 MG PO TABS
20.0000 mg | ORAL_TABLET | Freq: Every day | ORAL | Status: DC
  Administered 2017-11-27 – 2017-11-28 (×2): 20 mg via ORAL
  Filled 2017-11-26 (×2): qty 1

## 2017-11-26 MED ORDER — DONEPEZIL HCL 10 MG PO TABS: 10.0000 mg | ORAL_TABLET | Freq: Every day | ORAL | 3 refills | Status: DC

## 2017-11-26 MED ORDER — PRAVASTATIN SODIUM 40 MG PO TABS
40.0000 mg | ORAL_TABLET | Freq: Every day | ORAL | Status: DC
  Administered 2017-11-27 – 2017-11-28 (×2): 40 mg via ORAL
  Filled 2017-11-26 (×2): qty 1

## 2017-11-26 MED ORDER — ABIRATERONE ACETATE 500 MG PO TABS
1000.0000 mg | ORAL_TABLET | Freq: Every day | ORAL | Status: DC
  Administered 2017-11-26 – 2017-11-27 (×2): 1000 mg via ORAL

## 2017-11-26 MED ORDER — GABAPENTIN 300 MG PO CAPS: 300.0000 mg | ORAL_CAPSULE | Freq: Three times a day (TID) | ORAL | 3 refills | Status: DC

## 2017-11-26 MED ORDER — BUPROPION HCL ER (XL) 150 MG PO TB24
150.0000 mg | ORAL_TABLET | Freq: Every day | ORAL | Status: DC
  Administered 2017-11-26 – 2017-11-28 (×3): 150 mg via ORAL
  Filled 2017-11-26 (×3): qty 1

## 2017-11-26 MED ORDER — PRAMIPEXOLE DIHYDROCHLORIDE 0.25 MG PO TABS
0.5000 mg | ORAL_TABLET | Freq: Two times a day (BID) | ORAL | Status: DC
  Administered 2017-11-26 – 2017-11-28 (×4): 0.5 mg via ORAL
  Filled 2017-11-26 (×5): qty 2

## 2017-11-26 MED ORDER — ZOLPIDEM TARTRATE 5 MG PO TABS
5.0000 mg | ORAL_TABLET | Freq: Every evening | ORAL | Status: DC | PRN
  Administered 2017-11-26: 5 mg via ORAL
  Filled 2017-11-26: qty 1

## 2017-11-26 MED ORDER — BUPROPION HCL ER (XL) 150 MG PO TB24: 150.0000 mg | ORAL_TABLET | Freq: Every day | ORAL | 3 refills | Status: DC

## 2017-11-26 NOTE — Progress Notes (Signed)
Pt gave permission for his wife to remain in the room and help answer questions on the nursing admission history. Lucius Conn BSN, RN-BC Admissions RN 11/26/2017 4:23 PM

## 2017-11-26 NOTE — H&P (Signed)
History and Physical    Victor Davis BWI:[id number] DOB: 11-Oct-1940 DOA: 11/26/2017  PCP: Biagio Borg, MD  Patient coming from: home  I have personally briefly reviewed patient's old medical records in [place]  Chief Complaint: abnormal labs  HPI: Victor Davis is a 77 y.o. male with medical history significant of lung cancer and prostate cancer, coronary disease status post stent x4, chronic kidney disease, stroke, hypertension and several other medical problems presenting after being told to present by his primary care physician for abnormal labs with symptomatic anemia.  He notes for the past month he has been more tired.  He also notes lightheadedness and recurrent falls over the past couple weeks.  His last fall was about 2 weeks ago.  He denies any history of head trauma or loss of consciousness.  Otherwise, he denies symptoms.  He denies any fevers, chills, cough, cold, chest pain, shortness of breath, abdominal pain, nausea, vomiting.  He saw his physician recently and was told to come in because of abnormal labs.  ED Course: Labs, CXR, rectal exam.  Admit for symptomatic anemia.  Review of Systems: As per HPI otherwise 10 point review of systems negative.  of uniform is a 30  Past Medical History:  Diagnosis Date  . Cancer ([place])    lung and prostate cancer per wife  . 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 CKD (chronic kidney disease), stage III ([place])   . Coronary artery disease    a. DES to LAD and Cx 02/2012.  [name] Dementia   . Dysuria 10/10/2010  . Emphysema 04/01/2012   By CT chest, august 2013  . ERECTILE DYSFUNCTION 04/05/2007  . GERD (gastroesophageal reflux disease)   . HYPERLIPIDEMIA 07/30/2008  . HYPERSOMNIA 10/02/2007  . Impaired glucose tolerance 02/09/2011  . INSOMNIA-SLEEP DISORDER-UNSPEC 10/22/2009  . LOW BACK PAIN 04/05/2007  . Mild carotid artery disease ([place])    a. 1-39% bilaterally by duplex 11/2016.  . OSTEOARTHRITIS 04/05/2007  . PEPTIC ULCER  DISEASE 04/05/2007  . PERIPHERAL EDEMA 10/22/2009  . PROSTATE CANCER, HX OF 04/05/2007  . RESTLESS LEG SYNDROME 05/15/2007  . RHINITIS, ALLERGIC NOS 05/15/2007  . Sinus bradycardia 10/24/2010   a. prior h/o HR 40s, clonidine stopped 05/2017 due to this.  [name] Unspecified visual loss 07/15/2008  . UTI 10/24/2010    Past Surgical History:  Procedure Laterality Date  . CARDIAC CATHETERIZATION    . EYE SURGERY     right  . Inguinal herniorrhapy left  2002   x 2  . KNEE SURGERY     left  . Left hip replacement    . LYMPH NODE DISSECTION Right 03/22/2015   Procedure: LYMPH NODE DISSECTION;  Surgeon: Melrose Nakayama, MD;  Location: [facility];  Service: Thoracic;  Laterality: Right;  . PERCUTANEOUS CORONARY STENT INTERVENTION (PCI-S) N/A 03/08/2012   Procedure: PERCUTANEOUS CORONARY STENT INTERVENTION (PCI-S);  Surgeon: Burnell Blanks, MD;  Location: [facility] CATH LAB;  Service: Cardiovascular;  Laterality: N/A;  . PROSTATECTOMY  2002   radical  . Right hip replacement  09/22/09  . ROTATOR CUFF REPAIR     left  . s/p ventral surgery  2009  . SEGMENTECOMY Right 03/22/2015   Procedure: RIGHT LOWER LOBE SUPERIOR SEGMENTECTOMY;  Surgeon: Melrose Nakayama, MD;  Location: [facility];  Service: Thoracic;  Laterality: Right;  [name] VIDEO ASSISTED THORACOSCOPY Right 03/22/2015   Procedure: RIGHT VIDEO ASSISTED THORACOSCOPY;  Surgeon: Melrose Nakayama, MD;  Location: [facility];  Service:  Thoracic;  Laterality: Right;     reports that he quit smoking about 40 years ago. His smoking use included cigarettes. He has a 15.00 pack-year smoking history. He has never used smokeless tobacco. He reports that he does not drink alcohol or use drugs.  Allergies  Allergen Reactions  . Amitriptyline Hcl Other (See Comments)    REACTION: confusion  . Diphenhydramine Hcl Other (See Comments)    Keeps him awake  . Simvastatin Other (See Comments)    REACTION: leg pain  . Clonidine Derivatives     Dizziness, falls,  bradycardia  . Tylenol [Acetaminophen] Other (See Comments)    Dr advised pt not to take due to finding a spot on pt's kidney    Family History  Problem Relation Age of Onset  . Heart attack Father 20       Smoker  . Heart attack Brother 46  . Lung cancer Sister   . Colon cancer Neg Hx    Prior to Admission medications   Medication Sig Start Date End Date Taking? Authorizing Provider  abiraterone acetate (ZYTIGA) 500 MG tablet Take 1,000 mg by mouth daily. Take on an empty stomach 1 hour before or 2 hours after a meal.   Yes [provider]  amLODipine (NORVASC) 10 MG tablet TAKE 1 TABLET BY MOUTH  DAILY Patient taking differently: Take 10mg  by mouth daily 10/25/17  Yes Biagio Borg, MD  Ascorbic Acid (VITAMIN C) 100 MG tablet Take 250 mg by mouth daily.   Yes [provider]  aspirin 81 MG chewable tablet Chew 81 mg daily by mouth.   Yes [provider]  citalopram (CELEXA) 20 MG tablet Take 20 mg daily by mouth.   Yes [provider]  clopidogrel (PLAVIX) 75 MG tablet Take 75 mg daily by mouth.  05/21/17  Yes [provider]  donepezil (ARICEPT) 10 MG tablet Take 1 tablet (10 mg total) by mouth at bedtime. 11/26/17  Yes Biagio Borg, MD  gabapentin (NEURONTIN) 100 MG capsule Take 100 mg by mouth 3 (three) times daily.   Yes [provider]  hydrALAZINE (APRESOLINE) 25 MG tablet TAKE 1 TABLET (25 MG TOTAL) BY MOUTH EVERY 8 (EIGHT) HOURS. 06/10/17  Yes [provider]  isosorbide mononitrate (IMDUR) 30 MG 24 hr tablet TAKE 1 TABLET BY MOUTH  DAILY Patient taking differently: Take 30mg  by mouth daily 10/19/17  Yes Biagio Borg, MD  lovastatin (MEVACOR) 40 MG tablet Take 40 mg at bedtime by mouth.  05/21/17  Yes [provider]  meclizine (ANTIVERT) 25 MG tablet 12.5-25 mg as needed for dizziness. TAKE 1/2 - 1 TABLET BY MOUTH THREE TIMES A DAY AS NEEDED FOR DIZINESS    Yes [provider]  nitroGLYCERIN  (NITROSTAT) 0.4 MG SL tablet Place 1 tablet (0.4 mg total) under the tongue every 5 (five) minutes as needed for chest pain. 10/01/17  Yes Burnell Blanks, MD  pantoprazole (PROTONIX) 40 MG tablet TAKE 1 TABLET BY MOUTH  DAILY Patient taking differently: Take 40mg  by mouth daily 10/25/17  Yes Biagio Borg, MD  pramipexole (MIRAPEX) 0.5 MG tablet Take 0.5 mg by mouth 3 (three) times daily.  09/06/17  Yes [provider]  predniSONE (DELTASONE) 5 MG tablet Take 5 mg daily with breakfast by mouth.   Yes [provider]  buPROPion (WELLBUTRIN XL) 150 MG 24 hr tablet Take 1 tablet (150 mg total) by mouth daily. 11/26/17   Biagio Borg,  MD  fenoprofen (NALFON) 600 MG TABS tablet Take 600 mg by mouth.    [provider]  gabapentin (NEURONTIN) 300 MG capsule Take 1 capsule (300 mg total) by mouth 3 (three) times daily. 11/26/17   Biagio Borg, MD    Physical Exam: Vitals:   11/26/17 1753 11/26/17 1800 11/26/17 1815 11/26/17 1904  BP: (!) 151/68 (!) 151/67 (!) 156/64 (!) 165/74  Pulse: 73 68 71 75  Resp: 15 15 16 20   Temp: 97.7 F (36.5 C)  97.8 F (36.6 C) 97.7 F (36.5 C)  TempSrc: Oral  Oral   SpO2: 100% 100% 100% 100%  Weight:      Height:        Constitutional: NAD, calm, comfortable Vitals:   11/26/17 1753 11/26/17 1800 11/26/17 1815 11/26/17 1904  BP: (!) 151/68 (!) 151/67 (!) 156/64 (!) 165/74  Pulse: 73 68 71 75  Resp: 15 15 16 20   Temp: 97.7 F (36.5 C)  97.8 F (36.6 C) 97.7 F (36.5 C)  TempSrc: Oral  Oral   SpO2: 100% 100% 100% 100%  Weight:      Height:       Eyes: PERRL, pale conjunctiva ENMT: Mucous membranes are moist. Posterior pharynx clear of any exudate or lesions.Normal dentition.  Neck: normal, supple, no masses, no thyromegaly Respiratory: clear to auscultation bilaterally, no wheezing, no crackles. Normal respiratory effort. No accessory muscle use.  Cardiovascular: Regular rate and rhythm, no murmurs / rubs / gallops. No  extremity edema. 2+ pedal pulses. No carotid bruits.  Abdomen: no tenderness, no masses palpated. No hepatosplenomegaly. Bowel sounds positive.  Musculoskeletal: no clubbing / cyanosis. No joint deformity upper and lower extremities. Good ROM, no contractures. Normal muscle tone.  Skin: no rashes, lesions, ulcers. No induration Neurologic: CN 2-12 intact. Sensation intact Strength 5/5 in all 4.  Psychiatric: Normal judgment and insight. Alert and oriented x 3. Normal mood.   Labs on Admission: I have personally reviewed following labs and imaging studies  CBC: Recent Labs  Lab 11/26/17 1200 11/26/17 1506  WBC 6.7 5.4  NEUTROABS 3.9  --   HGB 6.7 Repeated and verified X2.* 6.6*  HCT 21.3 Repeated and verified X2.* 22.9*  MCV 75.0* 80.4  PLT 168.0 017   Basic Metabolic Panel: Recent Labs  Lab 11/26/17 1200 11/26/17 1506  NA 138 137  K 3.9 3.6  CL 107 106  CO2 22 22  GLUCOSE 86 114*  BUN 34* 35*  CREATININE 1.68* 1.72*  CALCIUM 9.1 9.0   GFR: Estimated Creatinine Clearance: 31.3 mL/min (A) (by C-G formula based on SCr of 1.72 mg/dL (H)). Liver Function Tests: Recent Labs  Lab 11/26/17 1200 11/26/17 1506  AST 11 17  ALT 6 10*  ALKPHOS 73 78  BILITOT 0.9 1.1  PROT 6.5 6.8  ALBUMIN 4.0 3.7   No results for input(s): LIPASE, AMYLASE in the last 168 hours. No results for input(s): AMMONIA in the last 168 hours. Coagulation Profile: No results for input(s): INR, PROTIME in the last 168 hours. Cardiac Enzymes: No results for input(s): CKTOTAL, CKMB, CKMBINDEX, TROPONINI in the last 168 hours. BNP (last 3 results) No results for input(s): PROBNP in the last 8760 hours. HbA1C: Recent Labs    11/26/17 1200  HGBA1C 5.5   CBG: No results for input(s): GLUCAP in the last 168 hours. Lipid Profile: Recent Labs    11/26/17 1200  CHOL 90  HDL 41.60  LDLCALC 37  TRIG 55.0  CHOLHDL 2  Thyroid Function Tests: Recent Labs    11/26/17 1200  TSH 2.36   Anemia  Panel: Recent Labs    11/26/17 1200 11/26/17 1707  VITAMINB12 1,295*  --   RETICCTPCT  --  2.2   Urine analysis:    Component Value Date/Time   COLORURINE YELLOW 11/26/2017 1706   APPEARANCEUR CLEAR 11/26/2017 1706   LABSPEC 1.010 11/26/2017 1706   PHURINE 6.0 11/26/2017 1706   GLUCOSEU NEGATIVE 11/26/2017 1706   GLUCOSEU NEGATIVE 11/26/2017 1200   HGBUR NEGATIVE 11/26/2017 1706   BILIRUBINUR NEGATIVE 11/26/2017 1706   KETONESUR NEGATIVE 11/26/2017 1706   PROTEINUR 30 (A) 11/26/2017 1706   UROBILINOGEN 0.2 11/26/2017 1200   NITRITE NEGATIVE 11/26/2017 1706   LEUKOCYTESUR NEGATIVE 11/26/2017 1706    Radiological Exams on Admission: Dg Chest 2 View  Result Date: 11/26/2017 CLINICAL DATA:  77 y/o  M; dyspnea. EXAM: CHEST - 2 VIEW COMPARISON:  11/09/2016 chest radiograph. FINDINGS: Stable mildly enlarged cardiac silhouette given differences in technique. Aortic atherosclerosis with calcification. Clear lungs. No pleural effusion or pneumothorax. No acute osseous abnormality is evident. Osteoarthrosis of the shoulder joints. IMPRESSION: No acute pulmonary process identified. Electronically Signed   By: Kristine Garbe M.D.   On: 11/26/2017 17:54    EKG: Independently reviewed. none  Assessment/Plan Active Problems:   Anemia  Symptomatic Anemia:  Guaic positive stool.  H/H down to 6.6 from 9.7 05/2017.  He's on DAPT for hx of CVA and CAD. Hold ASA, plavix Transfuse 1 units pRBC Consult GI with his guaic positive stool (Dr. Paulita Fujita to see in AM) Iron, b12, folate, ferritin.  Retics hypoproliferative. Orthostatics in AM after transfusion  Falls  Lightheadedness: last fall 2 weeks ago.  Likely due to above.  Follow orthostatics.  PT.  Hx Lung Cancer Cancer: follows with Dr. Julien Nordmann. S/p R lower lobectomy with LN dissection Aug 2016.    Hx Prostate Cancer:  Follows with Dr. Tresa Moore. Continue zytiga and prednisone.   Hx CAD Hx CVA: DES x 2 to LAD and x2 to circumflex  02/2012.  Hold DAPT.  Continue statin  Chronic Kidney Disease stage III: stable, follow   Hypertension: Hold antihypertensives  Depression: continue wellbutrin and celexa  Dementia: aricept  RLS: pramipexole  GERD: PPI  DVT prophylaxis: scd  Code Status: full  Family Communication: wife at bedside  Disposition Plan: pending improvement and further w/u  Consults called: GI (outlaw) Admission status: observation    Fayrene Helper MD [facility] Pager [phone]  If 7PM-7AM, please contact night-coverage [email or web address] Password [email or web address]  11/26/2017, 7:13 PM

## 2017-11-26 NOTE — ED Notes (Signed)
Bed: VZ48 Expected date:  Expected time:  Means of arrival:  Comments: HGB 6.4

## 2017-11-26 NOTE — ED Notes (Signed)
ED TO INPATIENT HANDOFF REPORT  Name/Age/Gender Victor Davis 77 y.o. male  Code Status Code Status History    Date Active Date Inactive Code Status Order ID Comments User Context   06/08/2017 1019 06/10/2017 1544 Full Code [id number]  Roxan Hockey, MD ED   03/22/2015 1433 03/27/2015 1413 Full Code [id number]  John Giovanni, PA-C Inpatient   01/18/2015 1624 01/21/2015 1803 Full Code [id number]  Markus Daft, MD Inpatient   01/18/2015 0253 01/18/2015 1624 Full Code [id number]  Ivor Costa, MD Inpatient    Advance Directive Documentation     Most Recent Value  Type of Advance Directive  Healthcare Power of Attorney, Living will  Pre-existing out of facility DNR order (yellow form or pink MOST form)  -  "MOST" Form in Place?  -      Home/SNF/Other Home  Chief Complaint low hemoglobin level  Level of Care/Admitting Diagnosis ED Disposition    ED Disposition Condition [place]: [place] [100102]  Level of Care: Telemetry [5]  Admit to tele based on following criteria: Other see comments  Comments: gi bleed  Diagnosis: Anemia [326712]  Admitting Physician: Elodia Florence [phone]  Attending Physician: Cephus Slater, A CALDWELL [phone]  PT Class (Do Not Modify): Observation [104]  PT Acc Code (Do Not Modify): Observation [10022]       Medical History Past Medical History:  Diagnosis Date  . Cancer ([place])    lung and prostate cancer per wife  . 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 CKD (chronic kidney disease), stage III ([place])   . Coronary artery disease    a. DES to LAD and Cx 02/2012.  [name] Dementia   . Dysuria 10/10/2010  . Emphysema 04/01/2012   By CT chest, august 2013  . ERECTILE DYSFUNCTION 04/05/2007  . GERD (gastroesophageal reflux disease)   . HYPERLIPIDEMIA 07/30/2008  . HYPERSOMNIA 10/02/2007  . Impaired glucose tolerance 02/09/2011  . INSOMNIA-SLEEP DISORDER-UNSPEC 10/22/2009  . LOW BACK PAIN 04/05/2007  . Mild carotid  artery disease ([place])    a. 1-39% bilaterally by duplex 11/2016.  . OSTEOARTHRITIS 04/05/2007  . PEPTIC ULCER DISEASE 04/05/2007  . PERIPHERAL EDEMA 10/22/2009  . PROSTATE CANCER, HX OF 04/05/2007  . RESTLESS LEG SYNDROME 05/15/2007  . RHINITIS, ALLERGIC NOS 05/15/2007  . Sinus bradycardia 10/24/2010   a. prior h/o HR 40s, clonidine stopped 05/2017 due to this.  [name] Unspecified visual loss 07/15/2008  . UTI 10/24/2010    Allergies Allergies  Allergen Reactions  . Amitriptyline Hcl Other (See Comments)    REACTION: confusion  . Diphenhydramine Hcl Other (See Comments)    Keeps him awake  . Simvastatin Other (See Comments)    REACTION: leg pain  . Clonidine Derivatives     Dizziness, falls, bradycardia  . Tylenol [Acetaminophen] Other (See Comments)    Dr advised pt not to take due to finding a spot on pt's kidney    IV Location/Drains/Wounds Patient Lines/Drains/Airways Status   Active Line/Drains/Airways    Name:   Placement date:   Placement time:   Site:   Days:   Peripheral IV 11/26/17 Left Forearm   11/26/17    1704    Forearm   less than 1   Peripheral IV 11/26/17 Left Antecubital   11/26/17    1704    Antecubital   less than 1   Incision (Closed) 03/22/15 Chest Right   03/22/15    0816     980  Labs/Imaging Results for orders placed or performed during the hospital encounter of 11/26/17 (from the past 48 hour(s))  Comprehensive metabolic panel     Status: Abnormal   Collection Time: 11/26/17  3:06 PM  Result Value Ref Range   Sodium 137 135 - 145 mmol/L   Potassium 3.6 3.5 - 5.1 mmol/L   Chloride 106 101 - 111 mmol/L   CO2 22 22 - 32 mmol/L   Glucose, Bld 114 (H) 65 - 99 mg/dL   BUN 35 (H) 6 - 20 mg/dL   Creatinine, Ser 1.72 (H) 0.61 - 1.24 mg/dL   Calcium 9.0 8.9 - 10.3 mg/dL   Total Protein 6.8 6.5 - 8.1 g/dL   Albumin 3.7 3.5 - 5.0 g/dL   AST 17 15 - 41 U/L   ALT 10 (L) 17 - 63 U/L   Alkaline Phosphatase 78 38 - 126 U/L   Total Bilirubin 1.1 0.3 - 1.2  mg/dL   GFR calc non Af Amer 36 (L) >60 mL/min   GFR calc Af Amer 42 (L) >60 mL/min    Comment: (NOTE) The eGFR has been calculated using the CKD EPI equation. This calculation has not been validated in all clinical situations. eGFR's persistently <60 mL/min signify possible Chronic Kidney Disease.    Anion gap 9 5 - 15    Comment: Performed at [facility], [place] [address] [place], [place] [address]  CBC     Status: Abnormal   Collection Time: 11/26/17  3:06 PM  Result Value Ref Range   WBC 5.4 4.0 - 10.5 K/uL   RBC 2.85 (L) 4.22 - 5.81 MIL/uL   Hemoglobin 6.6 (LL) 13.0 - 17.0 g/dL    Comment: REPEATED TO VERIFY CRITICAL RESULT CALLED TO, READ BACK BY AND VERIFIED WITH: Forde Radon [id number] @ 1536 BY J SCOTTON    HCT 22.9 (L) 39.0 - 52.0 %   MCV 80.4 78.0 - 100.0 fL   MCH 23.2 (L) 26.0 - 34.0 pg   MCHC 28.8 (L) 30.0 - 36.0 g/dL   RDW 15.3 11.5 - 15.5 %   Platelets 154 150 - 400 K/uL    Comment: Performed at [facility], [place] [address], [place], [place] [address]  Type and screen [place]     Status: None (Preliminary result)   Collection Time: 11/26/17  3:06 PM  Result Value Ref Range   ABO/RH(D) A NEG    Antibody Screen NEG    Sample Expiration 11/29/2017    Unit Number [id number]    Blood Component Type RED CELLS,LR    Unit division 00    Status of Unit ISSUED    Transfusion Status OK TO TRANSFUSE    Crossmatch Result      Compatible Performed at [facility], [place] [address] [place], [place] [address]   ABO/Rh     Status: None   Collection Time: 11/26/17  3:06 PM  Result Value Ref Range   ABO/RH(D)      A NEG Performed at [facility] [address], [place], [place] [address]   Prepare RBC     Status: None   Collection Time: 11/26/17  3:44 PM  Result Value Ref Range   Order Confirmation      ORDER PROCESSED BY BLOOD BANK Performed at [facility], [place] [address], [place], [place] [address]   POC occult blood, ED     Status: Abnormal  Collection Time: 11/26/17  3:45 PM  Result Value Ref Range   Fecal Occult Bld POSITIVE (A) NEGATIVE  Urinalysis, Routine w reflex microscopic     Status: Abnormal   Collection Time: 11/26/17  5:06 PM  Result Value Ref Range   Color, Urine YELLOW YELLOW   APPearance CLEAR CLEAR   Specific Gravity, Urine 1.010 1.005 - 1.030   pH 6.0 5.0 - 8.0   Glucose, UA NEGATIVE NEGATIVE mg/dL   Hgb urine dipstick NEGATIVE NEGATIVE   Bilirubin Urine NEGATIVE NEGATIVE   Ketones, ur NEGATIVE NEGATIVE mg/dL   Protein, ur 30 (A) NEGATIVE mg/dL   Nitrite NEGATIVE NEGATIVE   Leukocytes, UA NEGATIVE NEGATIVE   RBC / HPF 0-5 0 - 5 RBC/hpf   WBC, UA NONE SEEN 0 - 5 WBC/hpf   Bacteria, UA NONE SEEN NONE SEEN   Squamous Epithelial / LPF NONE SEEN NONE SEEN    Comment: Performed at [facility], [place] [address] [place], [place] [address]  Reticulocytes     Status: Abnormal   Collection Time: 11/26/17  5:07 PM  Result Value Ref Range   Retic Ct Pct 2.2 0.4 - 3.1 %   RBC. 2.48 (L) 4.22 - 5.81 MIL/uL   Retic Count, Absolute 54.6 19.0 - 186.0 K/uL    Comment: Performed at [facility], [place] [address], [place], [place] [address]   No results found.  Pending Labs Unresulted Labs (From admission, onward)   Start     Ordered   11/26/17 1547  Vitamin B12  (Anemia Panel (PNL))  STAT,   STAT     11/26/17 1546   11/26/17 1547  Folate  (Anemia Panel (PNL))  STAT,   STAT     11/26/17 1546   11/26/17 1547  Iron and TIBC  (Anemia Panel (PNL))  STAT,   STAT     11/26/17 1546   11/26/17 1547  Ferritin  (Anemia Panel (PNL))  STAT,   STAT     11/26/17 1546   Signed and Held  Comprehensive metabolic panel  Tomorrow morning,   R     Signed and Held   Signed and Held  CBC  Tomorrow morning,   R     Signed and Held   Signed and Held  Protime-INR  Tomorrow morning,   R     Signed  and Held   Signed and Held  APTT  Tomorrow morning,   R     Signed and Held      Vitals/Pain Today's Vitals   11/26/17 1715 11/26/17 1730 11/26/17 1745 11/26/17 1749  BP: 135/69 (!) 151/68 (!) 153/68   Pulse: 75 71 75   Resp:      Temp:      TempSrc:      SpO2: 100% 100% 100%   Weight:      Height:      PainSc:    0-No pain    Isolation Precautions No active isolations  Medications Medications  0.9 %  sodium chloride infusion ( Intravenous New Bag/Given 11/26/17 1715)    Mobility walks

## 2017-11-26 NOTE — Assessment & Plan Note (Signed)

## 2017-11-26 NOTE — ED Triage Notes (Signed)
Patient went to PCP today for a regular check-up and Hgb was found to be 6.4. Patient denies any visible bleeding. Patient does take Plavix.

## 2017-11-26 NOTE — Assessment & Plan Note (Signed)
Also for f/u b12

## 2017-11-26 NOTE — ED Provider Notes (Signed)
[facility] DEPT Provider Note   CSN: [id number] Arrival date & time: 11/26/17  1305     History   Chief Complaint Chief Complaint  Patient presents with  . Abnormal Lab    Hgb-6.4    HPI Victor Davis is a 77 y.o. male.  HPI  77 year old male with a history of lung and prostate cancer, chronic kidney disease, prior stroke, coronary disease and multiple other medical problems presents with anemia.  He states he has been anemic for a while but went to his PCP today and was called because his hemoglobin was 6.7.  Patient states is been feeling fatigue, sleepiness, and shortness of breath for a couple weeks and possibly up to a month.  He denies chest pain.  He denies any obvious source of bleeding or abdominal pain.  He denies melena or rectal bleeding.  He is on Plavix.  Past Medical History:  Diagnosis Date  . Cancer ([place])    lung and prostate cancer per wife  . 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 CKD (chronic kidney disease), stage III ([place])   . Coronary artery disease    a. DES to LAD and Cx 02/2012.  [name] Dementia   . Dysuria 10/10/2010  . Emphysema 04/01/2012   By CT chest, august 2013  . ERECTILE DYSFUNCTION 04/05/2007  . GERD (gastroesophageal reflux disease)   . HYPERLIPIDEMIA 07/30/2008  . HYPERSOMNIA 10/02/2007  . Impaired glucose tolerance 02/09/2011  . INSOMNIA-SLEEP DISORDER-UNSPEC 10/22/2009  . LOW BACK PAIN 04/05/2007  . Mild carotid artery disease ([place])    a. 1-39% bilaterally by duplex 11/2016.  . OSTEOARTHRITIS 04/05/2007  . PEPTIC ULCER DISEASE 04/05/2007  . PERIPHERAL EDEMA 10/22/2009  . PROSTATE CANCER, HX OF 04/05/2007  . RESTLESS LEG SYNDROME 05/15/2007  . RHINITIS, ALLERGIC NOS 05/15/2007  . Sinus bradycardia 10/24/2010   a. prior h/o HR 40s, clonidine stopped 05/2017 due to this.  [name] Unspecified visual loss 07/15/2008  . UTI 10/24/2010    Patient Active Problem List   Diagnosis Date Noted  . Depression 06/20/2017  .  Bradycardia/Falls 06/08/2017  . Head injury without skull fracture/S/p Fall with Lt Frontal Laceration 06/08/2017  . Symptomatic sinus bradycardia 06/08/2017  . B12 deficiency 04/11/2017  . CKD (chronic kidney disease), stage III ([place]) 02/04/2016  . Snoring 01/21/2016  . Dementia 10/08/2015  . Multiple bruises 10/08/2015  . Non-small cell carcinoma of right lung, stage 1 ([place]) 04/29/2015  . Lung nodule 03/22/2015  . Cholecystostomy drain infection ([place]) 01/25/2015  . Abdominal pain 01/18/2015  . Cholecystitis 01/18/2015  . Acute on chronic kidney failure ([place]) 01/18/2015  . Right upper quadrant pain   . Acute cholecystitis   . Renal cyst 12/31/2012  . Hearing loss 07/05/2012  . Lesion of nose 07/05/2012  . Anemia, unspecified 04/02/2012  . Pulmonary emphysema ([place]) 04/01/2012  . Hypertension 03/09/2012  . Pulmonary nodule, right 02/08/2012  . Mitral regurgitation 02/06/2012  . CAD (coronary artery disease) 01/04/2012  . Unspecified disorder of liver 12/18/2011  . Hyperthyroidism 12/18/2011  . Nonspecific abnormal findings on radiological and examination of lung field 12/18/2011  . Impaired glucose tolerance 02/09/2011  . Encounter for well adult exam with abnormal findings 02/09/2011  . INSOMNIA-SLEEP DISORDER-UNSPEC 10/22/2009  . PERIPHERAL EDEMA 10/22/2009  . HLD (hyperlipidemia) 07/30/2008  . Peripheral vascular disease ([place]) 07/30/2008  . 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 Unspecified visual loss 07/15/2008  . SHOULDER PAIN, LEFT 01/22/2008  . Hypersomnia 10/02/2007  . RESTLESS LEG SYNDROME 05/15/2007  .  RHINITIS, ALLERGIC NOS 05/15/2007  . ERECTILE DYSFUNCTION 04/05/2007  . PEPTIC ULCER DISEASE 04/05/2007  . Osteoarthritis 04/05/2007  . LOW BACK PAIN 04/05/2007  . PROSTATE CANCER, HX OF 04/05/2007    Past Surgical History:  Procedure Laterality Date  . CARDIAC CATHETERIZATION    . EYE SURGERY     right  . Inguinal herniorrhapy left  2002   x 2  . KNEE  SURGERY     left  . Left hip replacement    . LYMPH NODE DISSECTION Right 03/22/2015   Procedure: LYMPH NODE DISSECTION;  Surgeon: Melrose Nakayama, MD;  Location: [facility];  Service: Thoracic;  Laterality: Right;  . PERCUTANEOUS CORONARY STENT INTERVENTION (PCI-S) N/A 03/08/2012   Procedure: PERCUTANEOUS CORONARY STENT INTERVENTION (PCI-S);  Surgeon: Burnell Blanks, MD;  Location: [facility] CATH LAB;  Service: Cardiovascular;  Laterality: N/A;  . PROSTATECTOMY  2002   radical  . Right hip replacement  09/22/09  . ROTATOR CUFF REPAIR     left  . s/p ventral surgery  2009  . SEGMENTECOMY Right 03/22/2015   Procedure: RIGHT LOWER LOBE SUPERIOR SEGMENTECTOMY;  Surgeon: Melrose Nakayama, MD;  Location: [facility];  Service: Thoracic;  Laterality: Right;  [name] VIDEO ASSISTED THORACOSCOPY Right 03/22/2015   Procedure: RIGHT VIDEO ASSISTED THORACOSCOPY;  Surgeon: Melrose Nakayama, MD;  Location: [facility];  Service: Thoracic;  Laterality: Right;        Home Medications    Prior to Admission medications   Medication Sig Start Date End Date Taking? Authorizing Provider  abiraterone Acetate (ZYTIGA) 250 MG tablet Take 500 mg 2 (two) times daily by mouth. Take on an empty stomach 1 hour before or 2 hours after a meal    [provider]  amLODipine (NORVASC) 10 MG tablet TAKE 1 TABLET BY MOUTH  DAILY 10/25/17   Biagio Borg, MD  aspirin 81 MG chewable tablet Chew 81 mg daily by mouth.    [provider]  buPROPion (WELLBUTRIN XL) 150 MG 24 hr tablet Take 1 tablet (150 mg total) by mouth daily. 11/26/17   Biagio Borg, MD  citalopram (CELEXA) 40 MG tablet Take 20 mg daily by mouth.    [provider]  clopidogrel (PLAVIX) 75 MG tablet Take 75 mg daily by mouth.  05/21/17   [provider]  donepezil (ARICEPT) 10 MG tablet Take 1 tablet (10 mg total) by mouth at bedtime. 11/26/17   Biagio Borg, MD  fenoprofen (NALFON) 600 MG TABS tablet Take 600 mg by mouth.    [provider]  gabapentin (NEURONTIN) 300 MG capsule Take 1 capsule (300 mg total) by mouth 3 (three) times daily. 11/26/17   Biagio Borg, MD  hydrALAZINE (APRESOLINE) 25 MG tablet TAKE 1 TABLET (25 MG TOTAL) BY MOUTH EVERY 8 (EIGHT) HOURS. 06/10/17   [provider]  isosorbide mononitrate (IMDUR) 30 MG 24 hr tablet TAKE 1 TABLET BY MOUTH  DAILY 10/19/17   Biagio Borg, MD  lovastatin (MEVACOR) 40 MG tablet Take 40 mg at bedtime by mouth.  05/21/17   [provider]  meclizine (ANTIVERT) 25 MG tablet TAKE 1/2 - 1 TABLET BY MOUTH THREE TIMES A DAY AS NEEDED FOR DIZINESS    [provider]  nitroGLYCERIN (NITROSTAT) 0.4 MG SL tablet Place 1 tablet (0.4 mg total) under the tongue every 5 (five) minutes as needed for chest pain. 10/01/17   Burnell Blanks, MD  pantoprazole (PROTONIX) 40 MG  tablet TAKE 1 TABLET BY MOUTH  DAILY 10/25/17   Biagio Borg, MD  pramipexole (MIRAPEX) 1 MG tablet Take 0.5 mg 2 (two) times daily by mouth.    [provider]  predniSONE (DELTASONE) 5 MG tablet Take 5 mg daily with breakfast by mouth.    [provider]    Family History Family History  Problem Relation Age of Onset  . Heart attack Father 47       Smoker  . Heart attack Brother 25  . Lung cancer Sister   . Colon cancer Neg Hx     Social History Social History   Tobacco Use  . Smoking status: Former Smoker    Packs/day: 1.00    Years: 15.00    Pack years: 15.00    Types: Cigarettes    Last attempt to quit: 02/05/1977    Years since quitting: 40.8  . Smokeless tobacco: Never Used  Substance Use Topics  . Alcohol use: No    Alcohol/week: 0.5 oz    Types: 1 Standard drinks or equivalent per week    Comment: RARE  . Drug use: No     Allergies   Amitriptyline hcl; Diphenhydramine hcl; Simvastatin; Clonidine derivatives; and Tylenol [acetaminophen]   Review of Systems Review of Systems  Constitutional: Positive for fatigue.  Respiratory:  Positive for shortness of breath.   Cardiovascular: Negative for chest pain.  Gastrointestinal: Negative for abdominal pain and blood in stool.  Neurological: Positive for weakness.  All other systems reviewed and are negative.    Physical Exam Updated Vital Signs BP (!) 159/67 (BP Location: Right Arm)   Pulse 68   Temp 97.9 F (36.6 C) (Oral)   Resp 20   Ht 5\' 9"  (1.753 m)   Wt 62.6 kg (138 lb)   SpO2 99%   BMI 20.38 kg/m   Physical Exam  Constitutional: He is oriented to person, place, and time. He appears well-developed and well-nourished.  HENT:  Head: Normocephalic and atraumatic.  Right Ear: External ear normal.  Left Ear: External ear normal.  Nose: Nose normal.  Eyes: Right eye exhibits no discharge. Left eye exhibits no discharge.  Neck: Neck supple.  Cardiovascular: Normal rate and regular rhythm.  Murmur heard. Pulmonary/Chest: Effort normal. He has decreased breath sounds in the right lower field.  Abdominal: Soft. He exhibits no distension. There is no tenderness.  Genitourinary:  Genitourinary Comments: No obvious hemorrhoids. No rectal mass or tenderness. Brown stool on exam  Musculoskeletal: He exhibits no edema.  Neurological: He is alert and oriented to person, place, and time.  Skin: Skin is warm and dry.  Nursing note and vitals reviewed.    ED Treatments / Results  Labs (all labs ordered are listed, but only abnormal results are displayed) Labs Reviewed  COMPREHENSIVE METABOLIC PANEL - Abnormal; Notable for the following components:      Result Value   Glucose, Bld 114 (*)    BUN 35 (*)    Creatinine, Ser 1.72 (*)    ALT 10 (*)    GFR calc non Af Amer 36 (*)    GFR calc Af Amer 42 (*)    All other components within normal limits  CBC - Abnormal; Notable for the following components:   RBC 2.85 (*)    Hemoglobin 6.6 (*)    HCT 22.9 (*)    MCH 23.2 (*)    MCHC 28.8 (*)    All other components within normal limits  POC  OCCULT BLOOD, ED  - Abnormal; Notable for the following components:   Fecal Occult Bld POSITIVE (*)    All other components within normal limits  URINALYSIS, ROUTINE W REFLEX MICROSCOPIC  VITAMIN B12  FOLATE  IRON AND TIBC  FERRITIN  RETICULOCYTES  TYPE AND SCREEN  ABO/RH  PREPARE RBC (CROSSMATCH)    EKG None  Radiology No results found.  Procedures .Critical Care Performed by: Sherwood Gambler, MD Authorized by: Sherwood Gambler, MD   Critical care provider statement:    Critical care time (minutes):  30   Critical care time was exclusive of:  Separately billable procedures and treating other patients   Critical care was necessary to treat or prevent imminent or life-threatening deterioration of the following conditions:  Circulatory failure and cardiac failure   Critical care was time spent personally by me on the following activities:  Development of treatment plan with patient or surrogate, discussions with consultants, evaluation of patient's response to treatment, examination of patient, obtaining history from patient or surrogate, ordering and performing treatments and interventions, ordering and review of laboratory studies, ordering and review of radiographic studies, pulse oximetry, re-evaluation of patient's condition and review of old charts   (including critical care time)  Medications Ordered in ED Medications  0.9 %  sodium chloride infusion (has no administration in time range)     Initial Impression / Assessment and Plan / ED Course  I have reviewed the triage vital signs and the nursing notes.  Pertinent labs & imaging results that were available during my care of the patient were reviewed by me and considered in my medical decision making (see chart for details).     Patient with acute on chronic symptomatic anemia.  His typical hemoglobin appears to be around 9 or 10.  Today it is 6.6.  WBC and platelets are within normal limits.  He has some heme positive stool on  rectal exam but no obvious melena.  He will need a workup given he is on a blood thinner.  However his vital signs are unremarkable besides moderate hypertension.  He will be transfused 1 unit of blood and admitted to the hospitalist service.  Final Clinical Impressions(s) / ED Diagnoses   Final diagnoses:  Symptomatic anemia    ED Discharge Orders    None       Sherwood Gambler, MD 11/26/17 1640

## 2017-11-26 NOTE — Assessment & Plan Note (Signed)
With mild worsening, for add wellbutrin asd,  to f/u any worsening symptoms or concerns

## 2017-11-26 NOTE — Assessment & Plan Note (Addendum)
With mild subjective worsening, for increased aricept 10 qd, declines neurology referral  In addition to the time spent performing CPE, I spent an additional 25 minutes face to face,in which greater than 50% of this time was spent in counseling and coordination of care for patient's acute illness as documented, including the differential dx, treatment, further evaluation and other management of dementia, anemia, depression, hyperglycemia, RLS, and b12 deficiency

## 2017-11-26 NOTE — ED Notes (Signed)
1ST UNIT OF 2 UNITS OF PRBC INFUSING. AWARE RESULTS OF BLOOD DRAWN EARLIER RESULTS PENDING. POC OCCULT PENDING. PT REQUESTING SOMETHING TO EAT

## 2017-11-26 NOTE — Progress Notes (Signed)
Subjective:    Patient ID: Victor Davis, male    DOB: 1941-02-05, 77 y.o.   MRN: [id number]  HPI  Here for wellness and f/u with wife who gives hx as pt has worsening dementia  Overall doing ok;  Pt denies Chest pain, worsening SOB, DOE, wheezing, orthopnea, PND, worsening LE edema, palpitations, dizziness or syncope.  Pt denies neurological change such as new headache, facial or extremity weakness.  Pt denies polydipsia, polyuria, or low sugar symptoms. Pt states overall good compliance with treatment and medications, good tolerability, and has been trying to follow appropriate diet.  Pt denies worsening depressive symptoms, suicidal ideation or panic. No fever, night sweats, wt loss, loss of appetite, or other constitutional symptoms.  Pt states good ability with ADL's, has higher fall risk, home safety reviewed and adequate, no other significant changes in hearing or vision, and only occasionally active with exercise.   Has had 2 falls recently, trips and falls, one with bruise and hematoma to mid right arm after hit wall. Now monitored per wife who keeps him from standing too quickly when first getting such as in the AM.   Dementia overall stable behafviorally but with gradual worsening, and not assoc with behavioral changes such as hallucinations, paranoia, or agitation. Does have worsening bilat LE burning pain without worsening LBP, not worse with ambulation, mod, constant, for several months Has had mild worsening depressive symptoms, but no suicidal ideation, or panic; has ongoing anxiety. No other interval hx or change Past Medical History:  Diagnosis Date  . Cancer ([place])    lung and prostate cancer per wife  . 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 CKD (chronic kidney disease), stage III ([place])   . Coronary artery disease    a. DES to LAD and Cx 02/2012.  [name] Dementia   . Dysuria 10/10/2010  . Emphysema 04/01/2012   By CT chest, august 2013  . ERECTILE DYSFUNCTION 04/05/2007  . GERD  (gastroesophageal reflux disease)   . HYPERLIPIDEMIA 07/30/2008  . HYPERSOMNIA 10/02/2007  . Impaired glucose tolerance 02/09/2011  . INSOMNIA-SLEEP DISORDER-UNSPEC 10/22/2009  . LOW BACK PAIN 04/05/2007  . Mild carotid artery disease ([place])    a. 1-39% bilaterally by duplex 11/2016.  . OSTEOARTHRITIS 04/05/2007  . PEPTIC ULCER DISEASE 04/05/2007  . PERIPHERAL EDEMA 10/22/2009  . PROSTATE CANCER, HX OF 04/05/2007  . RESTLESS LEG SYNDROME 05/15/2007  . RHINITIS, ALLERGIC NOS 05/15/2007  . Sinus bradycardia 10/24/2010   a. prior h/o HR 40s, clonidine stopped 05/2017 due to this.  [name] Unspecified visual loss 07/15/2008  . UTI 10/24/2010   Past Surgical History:  Procedure Laterality Date  . CARDIAC CATHETERIZATION    . EYE SURGERY     right  . Inguinal herniorrhapy left  2002   x 2  . KNEE SURGERY     left  . Left hip replacement    . LYMPH NODE DISSECTION Right 03/22/2015   Procedure: LYMPH NODE DISSECTION;  Surgeon: Melrose Nakayama, MD;  Location: [facility];  Service: Thoracic;  Laterality: Right;  . PERCUTANEOUS CORONARY STENT INTERVENTION (PCI-S) N/A 03/08/2012   Procedure: PERCUTANEOUS CORONARY STENT INTERVENTION (PCI-S);  Surgeon: Burnell Blanks, MD;  Location[facility] CATH LAB;  Service: Cardiovascular;  Laterality: N/A;  . PROSTATECTOMY  2002   radical  . Right hip replacement  09/22/09  . ROTATOR CUFF REPAIR     left  . s/p ventral surgery  2009  . SEGMENTECOMY Right 03/22/2015   Procedure: RIGHT LOWER LOBE  SUPERIOR SEGMENTECTOMY;  Surgeon: Melrose Nakayama, MD;  Location: [facility];  Service: Thoracic;  Laterality: Right;  [name] VIDEO ASSISTED THORACOSCOPY Right 03/22/2015   Procedure: RIGHT VIDEO ASSISTED THORACOSCOPY;  Surgeon: Melrose Nakayama, MD;  Location: [facility];  Service: Thoracic;  Laterality: Right;    reports that he quit smoking about 40 years ago. His smoking use included cigarettes. He has a 15.00 pack-year smoking history. He has never used smokeless tobacco. He reports  that he does not drink alcohol or use drugs. family history includes Heart attack (age of onset: 64) in his father; Heart attack (age of onset: 22) in his brother; Lung cancer in his sister. Allergies  Allergen Reactions  . Amitriptyline Hcl Other (See Comments)    REACTION: confusion  . Diphenhydramine Hcl Other (See Comments)    Keeps him awake  . Simvastatin Other (See Comments)    REACTION: leg pain  . Clonidine Derivatives     Dizziness, falls, bradycardia  . Tylenol [Acetaminophen] Other (See Comments)    Dr advised pt not to take due to finding a spot on pt's kidney   No current facility-administered medications on file prior to visit.    Current Outpatient Medications on File Prior to Visit  Medication Sig Dispense Refill  . amLODipine (NORVASC) 10 MG tablet TAKE 1 TABLET BY MOUTH  DAILY (Patient taking differently: Take 10mg  by mouth daily) 90 tablet 2  . aspirin 81 MG chewable tablet Chew 81 mg daily by mouth.    . citalopram (CELEXA) 20 MG tablet Take 20 mg daily by mouth.    . clopidogrel (PLAVIX) 75 MG tablet Take 75 mg daily by mouth.     . fenoprofen (NALFON) 600 MG TABS tablet Take 600 mg by mouth.    . hydrALAZINE (APRESOLINE) 25 MG tablet TAKE 1 TABLET (25 MG TOTAL) BY MOUTH EVERY 8 (EIGHT) HOURS.  0  . isosorbide mononitrate (IMDUR) 30 MG 24 hr tablet TAKE 1 TABLET BY MOUTH  DAILY (Patient taking differently: Take 30mg  by mouth daily) 90 tablet 1  . lovastatin (MEVACOR) 40 MG tablet Take 40 mg at bedtime by mouth.     . meclizine (ANTIVERT) 25 MG tablet 12.5-25 mg as needed for dizziness. TAKE 1/2 - 1 TABLET BY MOUTH THREE TIMES A DAY AS NEEDED FOR DIZINESS     . nitroGLYCERIN (NITROSTAT) 0.4 MG SL tablet Place 1 tablet (0.4 mg total) under the tongue every 5 (five) minutes as needed for chest pain. 25 tablet 3  . pantoprazole (PROTONIX) 40 MG tablet TAKE 1 TABLET BY MOUTH  DAILY (Patient taking differently: Take 40mg  by mouth daily) 90 tablet 2  . predniSONE  (DELTASONE) 5 MG tablet Take 5 mg daily with breakfast by mouth.     Review of Systems Constitutional: Negative for other unusual diaphoresis, sweats, appetite or weight changes HENT: Negative for other worsening hearing loss, ear pain, facial swelling, mouth sores or neck stiffness.   Eyes: Negative for other worsening pain, redness or other visual disturbance.  Respiratory: Negative for other stridor or swelling Cardiovascular: Negative for other palpitations or other chest pain  Gastrointestinal: Negative for worsening diarrhea or loose stools, blood in stool, distention or other pain Genitourinary: Negative for hematuria, flank pain or other change in urine volume.  Musculoskeletal: Negative for myalgias or other joint swelling.  Skin: Negative for other color change, or other wound or worsening drainage.  Neurological: Negative for other syncope or numbness. Hematological: Negative for other adenopathy or  swelling Psychiatric/Behavioral: Negative for hallucinations, other worsening agitation, SI, self-injury, or new decreased concentration All other system neg per pt    Objective:   Physical Exam BP (!) 142/78   Pulse 78   Temp 98 F (36.7 C) (Oral)   Ht 5\' 7"  (1.702 m)   Wt 138 lb (62.6 kg)   SpO2 98%   BMI 21.61 kg/m  VS noted,  Constitutional: Pt is oriented to person, place, and time. Appears well-developed and well-nourished, in no significant distress and comfortable Head: Normocephalic and atraumatic  Eyes: Conjunctivae and EOM are normal. Pupils are equal, round, and reactive to light Right Ear: External ear normal without discharge Left Ear: External ear normal without discharge Nose: Nose without discharge or deformity Mouth/Throat: Oropharynx is without other ulcerations and moist  Neck: Normal range of motion. Neck supple. No JVD present. No tracheal deviation present or significant neck LA or mass Cardiovascular: Normal rate, regular rhythm, normal heart sounds  and intact distal pulses.   Pulmonary/Chest: WOB normal and breath sounds without rales or wheezing  Abdominal: Soft. Bowel sounds are normal. NT. No HSM  Musculoskeletal: Normal range of motion. Exhibits no edema Lymphadenopathy: Has no other cervical adenopathy.  Neurological: Pt is alert and oriented to person, place, and time. Pt has normal reflexes. No cranial nerve deficit. Motor grossly intact, Gait intact Skin: Skin is warm and dry. No rash noted or new ulcerations Psychiatric:  Has normal mood and affect. Behavior is normal without agitation No other exam findings    Assessment & Plan:

## 2017-11-26 NOTE — Assessment & Plan Note (Signed)
stable overall by history and exam, recent data reviewed with pt, and pt to continue medical treatment as before,  to f/u any worsening symptoms or concerns Lab Results  Component Value Date   HGBA1C 5.5 11/26/2017

## 2017-11-26 NOTE — ED Notes (Signed)
ED Provider at bedside. 

## 2017-11-26 NOTE — Telephone Encounter (Signed)
Received call from susan in the lab w/critical Hemoglobin- 6.7 & hematocrit- 21.6

## 2017-11-26 NOTE — Telephone Encounter (Signed)
Notified pt spoke w/wife gave her MD response. Wife states they will go to [name] ED now.Marland KitchenJohny Davis

## 2017-11-26 NOTE — Assessment & Plan Note (Signed)
Ok for increase gabapentin to 300 tid,  to f/u any worsening symptoms or concerns

## 2017-11-26 NOTE — Telephone Encounter (Signed)
Pt needs to go to ED now for possible transfusion

## 2017-11-26 NOTE — Assessment & Plan Note (Signed)
Will need f/u today, no recent overt bleeding

## 2017-11-26 NOTE — Patient Instructions (Signed)
Ok to increase the aricept to 10 mg per day  Ok to incresae the gabapentin to 300 mg three times per day  Please take all new medication as prescribed - the wellbutrin for depression  Please continue all other medications as before, and refills have been done if requested.  Please have the pharmacy call with any other refills you may need.  Please continue your efforts at being more active, low cholesterol diet, and weight control.  You are otherwise up to date with prevention measures today.  Please keep your appointments with your specialists as you may have planned  Please go to the LAB in the Basement (turn left off the elevator) for the tests to be done today  You will be contacted by phone if any changes need to be made immediately.  Otherwise, you will receive a letter about your results with an explanation, but please check with MyChart first.  Please remember to sign up for MyChart if you have not done so, as this will be important to you in the future with finding out test results, communicating by private email, and scheduling acute appointments online when needed.  Please return in 6 months, or sooner if needed

## 2017-11-26 NOTE — ED Notes (Signed)
RN notified of abnormal lab 

## 2017-11-26 NOTE — ED Notes (Signed)
ADMITTING MD PRESENT

## 2017-11-27 ENCOUNTER — Encounter: Payer: Self-pay | Admitting: Internal Medicine

## 2017-11-27 ENCOUNTER — Ambulatory Visit: Payer: Medicare Other | Admitting: Internal Medicine

## 2017-11-27 DIAGNOSIS — Z7901 Long term (current) use of anticoagulants: Secondary | ICD-10-CM | POA: Diagnosis not present

## 2017-11-27 DIAGNOSIS — D649 Anemia, unspecified: Secondary | ICD-10-CM | POA: Diagnosis not present

## 2017-11-27 DIAGNOSIS — D509 Iron deficiency anemia, unspecified: Secondary | ICD-10-CM | POA: Diagnosis not present

## 2017-11-27 DIAGNOSIS — R195 Other fecal abnormalities: Secondary | ICD-10-CM | POA: Diagnosis not present

## 2017-11-27 LAB — COMPREHENSIVE METABOLIC PANEL
ALT: 11 U/L — ABNORMAL LOW (ref 17–63)
AST: 12 U/L — AB (ref 15–41)
Albumin: 3.1 g/dL — ABNORMAL LOW (ref 3.5–5.0)
Alkaline Phosphatase: 62 U/L (ref 38–126)
Anion gap: 7 (ref 5–15)
BILIRUBIN TOTAL: 1.4 mg/dL — AB (ref 0.3–1.2)
BUN: 27 mg/dL — AB (ref 6–20)
CO2: 22 mmol/L (ref 22–32)
Calcium: 8.7 mg/dL — ABNORMAL LOW (ref 8.9–10.3)
Chloride: 109 mmol/L (ref 101–111)
Creatinine, Ser: 1.62 mg/dL — ABNORMAL HIGH (ref 0.61–1.24)
GFR, EST AFRICAN AMERICAN: 45 mL/min — AB (ref >60)
GFR, EST NON AFRICAN AMERICAN: 39 mL/min — AB (ref >60)
Glucose, Bld: 103 mg/dL — ABNORMAL HIGH (ref 65–99)
POTASSIUM: 4.2 mmol/L (ref 3.5–5.1)
Sodium: 138 mmol/L (ref 135–145)
TOTAL PROTEIN: 5.6 g/dL — AB (ref 6.5–8.1)

## 2017-11-27 LAB — APTT: APTT: 27 s (ref 24–36)

## 2017-11-27 LAB — TYPE AND SCREEN
ABO/RH(D): A NEG
ANTIBODY SCREEN: NEGATIVE
Unit division: 0

## 2017-11-27 LAB — CBC
HCT: 24.6 % — ABNORMAL LOW (ref 39.0–52.0)
Hemoglobin: 7.5 g/dL — ABNORMAL LOW (ref 13.0–17.0)
MCH: 24.8 pg — ABNORMAL LOW (ref 26.0–34.0)
MCHC: 30.5 g/dL (ref 30.0–36.0)
MCV: 81.2 fL (ref 78.0–100.0)
PLATELETS: 133 10*3/uL — AB (ref 150–400)
RBC: 3.03 MIL/uL — AB (ref 4.22–5.81)
RDW: 15 % (ref 11.5–15.5)
WBC: 4.5 10*3/uL (ref 4.0–10.5)

## 2017-11-27 LAB — BPAM RBC
BLOOD PRODUCT EXPIRATION DATE: 201905152359
ISSUE DATE / TIME: 201904151753
Unit Type and Rh: 600

## 2017-11-27 LAB — PROTIME-INR
INR: 0.99
Prothrombin Time: 13 seconds (ref 11.4–15.2)

## 2017-11-27 LAB — MRSA PCR SCREENING: MRSA by PCR: POSITIVE — AB

## 2017-11-27 MED ORDER — CHLORHEXIDINE GLUCONATE CLOTH 2 % EX PADS: 6.0000 | MEDICATED_PAD | Freq: Every day | CUTANEOUS | Status: DC

## 2017-11-27 MED ORDER — PEG-KCL-NACL-NASULF-NA ASC-C 100 G PO SOLR: 1.0000 | Freq: Once | ORAL | Status: DC

## 2017-11-27 MED ORDER — PEG-KCL-NACL-NASULF-NA ASC-C 100 G PO SOLR
0.5000 | Freq: Once | ORAL | Status: AC
  Administered 2017-11-28: 100 g via ORAL
  Filled 2017-11-27: qty 1

## 2017-11-27 MED ORDER — MUPIROCIN 2 % EX OINT
1.0000 | TOPICAL_OINTMENT | Freq: Two times a day (BID) | CUTANEOUS | Status: DC
Start: 2017-11-27 — End: 2017-11-28
  Administered 2017-11-27 – 2017-11-28 (×2): 1 via NASAL
  Filled 2017-11-27: qty 22

## 2017-11-27 MED ORDER — PEG-KCL-NACL-NASULF-NA ASC-C 100 G PO SOLR
0.5000 | Freq: Once | ORAL | Status: AC
  Administered 2017-11-27: 100 g via ORAL
  Filled 2017-11-27: qty 1

## 2017-11-27 NOTE — Evaluation (Signed)
Physical Therapy Evaluation Patient Details Name: Victor Davis MRN: [id number] DOB: [birth year] Today's Date: 11/27/2017   History of Present Illness  77 yo male admitted with symptomatic anemia. Hx of lung cancer, prostate cancer, dementia, CAD, CVA, falls, CKD  Clinical Impression  On eval, pt was Min guard assist for mobility. He walked ~175 feet without an assistive device. Mildly unsteady intermittently but no LOB. He tolerated activity well. No c/o dizziness during session. See vitals section for orthostatic BP readings. BP at end of session was 161/77. Will follow during hospital stay. Discussed d/c plan-pt will return home with wife. Both denied need for f/u PT after discharge.     Follow Up Recommendations No PT follow up;Supervision for mobility/OOB    Equipment Recommendations  None recommended by PT    Recommendations for Other Services       Precautions / Restrictions Precautions Precautions: Fall Restrictions Weight Bearing Restrictions: No      Mobility  Bed Mobility Overal bed mobility: Modified Independent                Transfers Overall transfer level: Modified independent                  Ambulation/Gait Ambulation/Gait assistance: Min guard Ambulation Distance (Feet): 200 Feet Assistive device: None Gait Pattern/deviations: Step-through pattern;Drifts right/left;Staggering right;Staggering left     General Gait Details: Intermittent mild unsteadiness but no overt LOB. Pt denied dizziness. He tolerated activity well.   Stairs            Wheelchair Mobility    Modified Rankin (Stroke Patients Only)       Balance Overall balance assessment: Mild deficits observed, not formally tested                                           Pertinent Vitals/Pain Pain Assessment: No/denies pain    Home Living Family/patient expects to be discharged to:: Private residence Living Arrangements: Spouse/significant  other Available Help at Discharge: Family Type of Home: House Home Access: Stairs to enter Entrance Stairs-Rails: Right Entrance Stairs-Number of Steps: 3 Home Layout: One level Home Equipment: Environmental consultant - 2 wheels;Cane - single point;Shower seat      Prior Function Level of Independence: Independent         Comments: uses shower chair for bathing. drives     Hand Dominance        Extremity/Trunk Assessment   Upper Extremity Assessment Upper Extremity Assessment: Overall WFL for tasks assessed    Lower Extremity Assessment Lower Extremity Assessment: Generalized weakness    Cervical / Trunk Assessment Cervical / Trunk Assessment: Normal  Communication   Communication: No difficulties  Cognition Arousal/Alertness: Awake/alert Behavior During Therapy: WFL for tasks assessed/performed Overall Cognitive Status: Within Functional Limits for tasks assessed                                 General Comments: hx of dementia in chart      General Comments      Exercises     Assessment/Plan    PT Assessment Patient needs continued PT services  PT Problem List Decreased balance;Decreased mobility       PT Treatment Interventions Gait training;Functional mobility training;Therapeutic activities;Balance training;Patient/family education;Therapeutic exercise    PT Goals (Current goals can be found in  the Care Plan section)  Acute Rehab PT Goals Patient Stated Goal: home soon PT Goal Formulation: With patient/family Time For Goal Achievement: 12/11/17 Potential to Achieve Goals: Good    Frequency Min 3X/week   Barriers to discharge        Co-evaluation               AM-PAC PT "6 Clicks" Daily Activity  Outcome Measure Difficulty turning over in bed (including adjusting bedclothes, sheets and blankets)?: None Difficulty moving from lying on back to sitting on the side of the bed? : None Difficulty sitting down on and standing up from a  chair with arms (e.g., wheelchair, bedside commode, etc,.)?: None Help needed moving to and from a bed to chair (including a wheelchair)?: None Help needed walking in hospital room?: A Little Help needed climbing 3-5 steps with a railing? : A Little 6 Click Score: 22    End of Session Equipment Utilized During Treatment: Gait belt Activity Tolerance: Patient tolerated treatment well Patient left: in bed;with call bell/phone within reach;with bed alarm set;with family/visitor present   PT Visit Diagnosis: Difficulty in walking, not elsewhere classified (R26.2);History of falling (Z91.81);Unsteadiness on feet (R26.81)    Time: 1941-7408 PT Time Calculation (min) (ACUTE ONLY): 20 min   Charges:   PT Evaluation $PT Eval Moderate Complexity: 1 Mod     PT G Codes:          Weston Anna, MPT Pager: [phone]

## 2017-11-27 NOTE — Care Management Obs Status (Signed)
[facility] NOTIFICATION   Patient Details  Name: Victor Davis MRN: [id number] Date of Birth: 08-04-1941   Medicare Observation Status Notification Given:  Yes    Lynnell Catalan, RN 11/27/2017, 1:44 PM

## 2017-11-27 NOTE — Progress Notes (Signed)
PROGRESS NOTE    Victor Davis  GUR:[id number] DOB: May 21, 1941 DOA: 11/26/2017 PCP: Biagio Borg, MD   Brief Narrative:   77 year old with past medical history relevant for stage III CKD, coronary artery disease status post 4 stents in July 2013 (2 in LAD and 2 and circumflex), right lung adenocarcinoma status post right lower lobe resection in August 2016, hypertension, hyperlipidemia, prostate cancer on abiraterone who presented to his PCP with increasing fatigue and was found to have anemia along with guaiac positive stools.   Assessment & Plan:   Active Problems:   Symptomatic anemia   #) Iron deficiency anemia: Likely from chronic GI losses of unclear etiology.  His last colonoscopy in 2014 showed 3 sessile polyps.  While he does have diverticulosis he has not had any overt GI bleeding.  Unfortunately he has been maintained on dual antiplatelet therapy for many years. -1 unit packed red blood cells transfused, appropriate increase in hemoglobin -[facility] consult -We will consider transfusing iron prior to discharge  #) Coronary artery disease status post multiple stents: Last stents were in 2013.  It is unclear why he has been maintained on such prolonged course of aspirin and clopidogrel.  He does have vascular disease in other areas including carotid disease however this is not traumatic or significant. -Hold aspirin 81 mg and clopidogrel 75 mg -Continue statin 40 mg daily - Patient has a history of sinus bradycardia, hold beta-blocker -Continue isosorbide mononitrate 30 mg daily  #) Hypertension/hyperlipidemia: -Continue amlodipine 10 mg daily -Continue hydralazine 25 mg every 8 hours -Continue isosorbide mononitrate 30 mg daily -Continue statin  #) Lung cancer status post resection in 2016: On review the chart patient apparently had a right lower lobe surgery to remove this lung cancer with no evidence of metastatic disease.  Interestingly enough he did have a pleural  effusion on that side but it was felt to be related to a cholecystotomy tube which had been placed for acute cholecystitis.  #) Prostate cancer: Most recent nuclear medicine study shows no evidence of metastatic bone disease. -Continue abiraterone -Continue prednisone 5 mg every morning  #) Dementia: -Continue donepezil 10 mg nightly  #) Pain/psych: -Continue bupropion 150 mg daily -Continue gabapentin 300 mg 3 times daily -Continue citalopram 20 mg daily  Fluids: Tolerating p.o. Electrodes: Monitor and supplement Nutrition: Clear liquid diet  Prophylaxis: SCDs  Disposition: Pending GI evaluation and possible colonoscopy/EGD  Full code    Consultants:   [facility]  Procedures: (Don't include imaging studies which can be auto populated. Include things that cannot be auto populated i.e. Echo, Carotid and venous dopplers, Foley, Bipap, HD, tubes/drains, wound vac, central lines etc)  None  Antimicrobials: (specify start and planned stop date. Auto populated tables are space occupying and do not give end dates)  None   Subjective: Patient received approximately 5 mg of zolpidem last night.  He is quite sleepy however he does wake up to discuss his symptoms.  He reports he is feeling better but quite sleepy.  She denies any frank melena or hematochezia.  He denies any chest pain, nausea, vomiting, diarrhea.  Objective: Vitals:   11/26/17 2127 11/27/17 0431 11/27/17 0432 11/27/17 0434  BP: 139/65 (!) 149/70 136/67 (!) 127/58  Pulse: 60 (!) 55 (!) 56 (!) 58  Resp: 18 20    Temp: 98.3 F (36.8 C) 98.5 F (36.9 C)    TempSrc:  Oral    SpO2: 97% 98%    Weight:  61.2 kg (135 lb)  Height:        Intake/Output Summary (Last 24 hours) at 11/27/2017 1114 Last data filed at 11/26/2017 2127 Gross per 24 hour  Intake 412 ml  Output 225 ml  Net 187 ml   Filed Weights   11/26/17 1414 11/27/17 0434  Weight: 62.6 kg (138 lb) 61.2 kg (135 lb)     Examination:  General exam: Sleeping in bed but arousable Respiratory system: No increased work of breathing, no wheezes, crackles, rhonchi Cardiovascular system: Distant heart sounds, regular rate and rhythm, no murmurs. Gastrointestinal system: Soft, nondistended, plus bowel sounds Central nervous system: Sleeping in bed, moving all extremities Extremities: No lower extremity edema Skin: No rashes on visible skin Psychiatry: Unable to assess as patient is sleeping    Data Reviewed: I have personally reviewed following labs and imaging studies  CBC: Recent Labs  Lab 11/26/17 1200 11/26/17 1506 11/26/17 2323 11/27/17 0536  WBC 6.7 5.4  --  4.5  NEUTROABS 3.9  --   --   --   HGB 6.7 Repeated and verified X2.* 6.6* 7.1* 7.5*  HCT 21.3 Repeated and verified X2.* 22.9* 23.5* 24.6*  MCV 75.0* 80.4  --  81.2  PLT 168.0 154  --  960*   Basic Metabolic Panel: Recent Labs  Lab 11/26/17 1200 11/26/17 1506 11/27/17 0536  NA 138 137 138  K 3.9 3.6 4.2  CL 107 106 109  CO2 22 22 22   GLUCOSE 86 114* 103*  BUN 34* 35* 27*  CREATININE 1.68* 1.72* 1.62*  CALCIUM 9.1 9.0 8.7*   GFR: Estimated Creatinine Clearance: 32.5 mL/min (A) (by C-G formula based on SCr of 1.62 mg/dL (H)). Liver Function Tests: Recent Labs  Lab 11/26/17 1200 11/26/17 1506 11/27/17 0536  AST 11 17 12*  ALT 6 10* 11*  ALKPHOS 73 78 62  BILITOT 0.9 1.1 1.4*  PROT 6.5 6.8 5.6*  ALBUMIN 4.0 3.7 3.1*   No results for input(s): LIPASE, AMYLASE in the last 168 hours. No results for input(s): AMMONIA in the last 168 hours. Coagulation Profile: Recent Labs  Lab 11/27/17 0536  INR 0.99   Cardiac Enzymes: No results for input(s): CKTOTAL, CKMB, CKMBINDEX, TROPONINI in the last 168 hours. BNP (last 3 results) No results for input(s): PROBNP in the last 8760 hours. HbA1C: Recent Labs    11/26/17 1200  HGBA1C 5.5   CBG: No results for input(s): GLUCAP in the last 168 hours. Lipid  Profile: Recent Labs    11/26/17 1200  CHOL 90  HDL 41.60  LDLCALC 37  TRIG 55.0  CHOLHDL 2   Thyroid Function Tests: Recent Labs    11/26/17 1200  TSH 2.36   Anemia Panel: Recent Labs    11/26/17 1200 11/26/17 1707  VITAMINB12 1,295* 913  FOLATE  --  14.1  FERRITIN  --  7*  TIBC  --  447  IRON  --  17*  RETICCTPCT  --  2.2   Sepsis Labs: No results for input(s): PROCALCITON, LATICACIDVEN in the last 168 hours.  No results found for this or any previous visit (from the past 240 hour(s)).       Radiology Studies: Dg Chest 2 View  Result Date: 11/26/2017 CLINICAL DATA:  78 y/o  M; dyspnea. EXAM: CHEST - 2 VIEW COMPARISON:  11/09/2016 chest radiograph. FINDINGS: Stable mildly enlarged cardiac silhouette given differences in technique. Aortic atherosclerosis with calcification. Clear lungs. No pleural effusion or pneumothorax. No acute osseous abnormality is  evident. Osteoarthrosis of the shoulder joints. IMPRESSION: No acute pulmonary process identified. Electronically Signed   By: Kristine Garbe M.D.   On: 11/26/2017 17:54        Scheduled Meds: . abiraterone acetate  1,000 mg Oral QHS  . buPROPion  150 mg Oral Daily  . citalopram  20 mg Oral Daily  . donepezil  10 mg Oral QHS  . gabapentin  300 mg Oral TID  . pantoprazole  40 mg Oral Daily  . pramipexole  0.5 mg Oral BID  . pravastatin  40 mg Oral q1800  . predniSONE  5 mg Oral QHS   Continuous Infusions:   LOS: 0 days    Time spent: [place], MD [facility]   If 7PM-7AM, please contact night-coverage [email or web address] Password [email or web address] 11/27/2017, 11:14 AM

## 2017-11-27 NOTE — H&P (View-Only) (Signed)
Referring Provider: [facility]  Primary Care Physician:  Biagio Borg, MD Primary Gastroenterologist:  Zenovia Jarred,  MD  Reason for Consultation:    anemia   ASSESSMENT AND PLAN:    1. Iron deficiency anemia. 77 yo male with multiple medical problems not limited to CKD, CAD / PCI on chronic plavix, dementia and hx of prostate / bladder cancer. admitted with acute on chronic anemia, heme positive stools on plavix and ASA. Ferritin in 7.  -hgb up from 6.7 to 7.5 this am post one unit of blood last evening.  -patient has no upper or lower GI symptoms to point in any direction of potential GI blood loss. He will need upper and lower endoscopic workup and possible even small bowel evaluation if EGD / colon negative. Unfortunately his last dose of plavix was just two days ago so will not be doing any polypectomies.   2. Hx of colon adenomas, due for surveillance colonoscopy Oct 2019.   3. GERD, on daily PPI   HPI: MASE DHONDT is a 77 y.o. male with a PMH of dementia , CAD s/p PCI in 2013, PVD, prostate cancer, GERD, diverticulosis,aAnd adenomatous colon polyps found on last colonoscopy in 2014.  No high-grade dysplasia was present.  Patient due for surveillance colonoscopy October 2019.    Patient was seen this PCP yesterday for wellness checkup, labs revealed hemoglobin of 6.7, down from his baseline of around 10.  Stool is heme positive. Patient denies overt GI blood loss and wife validates it. He hasn't had any abdominal pain, nausea, vomiting. Weight is overall stable. No bowel changes. No hematuria, epistaxis. He takes a baby asa everyday, no other NSAIDs. He has had some dizziness, no chest pain. He has been getting winded lately when doing activities at home.      Past Medical History:  Diagnosis Date  . Cancer ([place])    lung and prostate cancer per wife  . 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 CKD (chronic kidney disease), stage III ([place])   . Coronary artery disease    a. DES to LAD and Cx 02/2012.  . Emphysema 04/01/2012   By CT chest, august 2013  . ERECTILE DYSFUNCTION 04/05/2007  . GERD (gastroesophageal reflux disease)   . HYPERLIPIDEMIA 07/30/2008  . HYPERSOMNIA 10/02/2007  . Impaired glucose tolerance 02/09/2011  . INSOMNIA-SLEEP DISORDER-UNSPEC 10/22/2009  . LOW BACK PAIN 04/05/2007  . Mild carotid artery disease ([place])    a. 1-39% bilaterally by duplex 11/2016.  . OSTEOARTHRITIS 04/05/2007  . PEPTIC ULCER DISEASE 04/05/2007  . PROSTATE CANCER, HX OF 04/05/2007  . RESTLESS LEG SYNDROME 05/15/2007  . RHINITIS, ALLERGIC NOS 05/15/2007  . Sinus bradycardia 10/24/2010   a. prior h/o HR 40s, clonidine stopped 05/2017 due to this.  [name] Unspecified visual loss 07/15/2008    Past Surgical History:  Procedure Laterality Date  . CARDIAC CATHETERIZATION    . EYE SURGERY     right  . Inguinal herniorrhapy left  2002   x 2  . KNEE SURGERY     left  . Left hip replacement    . LYMPH NODE DISSECTION Right 03/22/2015   Procedure: LYMPH NODE DISSECTION;  Surgeon: Melrose Nakayama, MD;  Location: [place];  Service: Thoracic;  Laterality: Right;  . PERCUTANEOUS CORONARY STENT INTERVENTION (PCI-S) N/A 03/08/2012   Procedure: PERCUTANEOUS CORONARY STENT INTERVENTION (PCI-S);  Surgeon: Burnell Blanks, MD;  Location: [facility] CATH LAB;  Service: Cardiovascular;  Laterality: N/A;  . PROSTATECTOMY  2002  radical  . Right hip replacement  09/22/09  . ROTATOR CUFF REPAIR     left  . s/p ventral surgery  2009  . SEGMENTECOMY Right 03/22/2015   Procedure: RIGHT LOWER LOBE SUPERIOR SEGMENTECTOMY;  Surgeon: Melrose Nakayama, MD;  Location: [place];  Service: Thoracic;  Laterality: Right;  [name] VIDEO ASSISTED THORACOSCOPY Right 03/22/2015   Procedure: RIGHT VIDEO ASSISTED THORACOSCOPY;  Surgeon: Melrose Nakayama, MD;  Location: [name];  Service: Thoracic;  Laterality: Right;    Prior to Admission medications   Medication Sig Start Date End Date Taking? Authorizing Provider    abiraterone acetate (ZYTIGA) 500 MG tablet Take 1,000 mg by mouth daily. Take on an empty stomach 1 hour before or 2 hours after a meal.   Yes [provider]  amLODipine (NORVASC) 10 MG tablet TAKE 1 TABLET BY MOUTH  DAILY Patient taking differently: Take 10mg  by mouth daily 10/25/17  Yes Biagio Borg, MD  Ascorbic Acid (VITAMIN C) 100 MG tablet Take 250 mg by mouth daily.   Yes [provider]  aspirin 81 MG chewable tablet Chew 81 mg daily by mouth.   Yes [provider]  citalopram (CELEXA) 20 MG tablet Take 20 mg daily by mouth.   Yes [provider]  clopidogrel (PLAVIX) 75 MG tablet Take 75 mg daily by mouth.  05/21/17  Yes [provider]  donepezil (ARICEPT) 10 MG tablet Take 1 tablet (10 mg total) by mouth at bedtime. 11/26/17  Yes Biagio Borg, MD  gabapentin (NEURONTIN) 100 MG capsule Take 100 mg by mouth 3 (three) times daily.   Yes [provider]  hydrALAZINE (APRESOLINE) 25 MG tablet TAKE 1 TABLET (25 MG TOTAL) BY MOUTH EVERY 8 (EIGHT) HOURS. 06/10/17  Yes [provider]  isosorbide mononitrate (IMDUR) 30 MG 24 hr tablet TAKE 1 TABLET BY MOUTH  DAILY Patient taking differently: Take 30mg  by mouth daily 10/19/17  Yes Biagio Borg, MD  lovastatin (MEVACOR) 40 MG tablet Take 40 mg at bedtime by mouth.  05/21/17  Yes [provider]  meclizine (ANTIVERT) 25 MG tablet 12.5-25 mg as needed for dizziness. TAKE 1/2 - 1 TABLET BY MOUTH THREE TIMES A DAY AS NEEDED FOR DIZINESS    Yes [provider]  nitroGLYCERIN (NITROSTAT) 0.4 MG SL tablet Place 1 tablet (0.4 mg total) under the tongue every 5 (five) minutes as needed for chest pain. 10/01/17  Yes Burnell Blanks, MD  pantoprazole (PROTONIX) 40 MG tablet TAKE 1 TABLET BY MOUTH  DAILY Patient taking differently: Take 40mg  by mouth daily 10/25/17  Yes Biagio Borg, MD  pramipexole (MIRAPEX) 0.5 MG tablet Take 0.5 mg by mouth 3 (three) times daily.  09/06/17   Yes [provider]  predniSONE (DELTASONE) 5 MG tablet Take 5 mg daily with breakfast by mouth.   Yes [provider]  buPROPion (WELLBUTRIN XL) 150 MG 24 hr tablet Take 1 tablet (150 mg total) by mouth daily. 11/26/17   Biagio Borg, MD  fenoprofen (NALFON) 600 MG TABS tablet Take 600 mg by mouth.    [provider]  gabapentin (NEURONTIN) 300 MG capsule Take 1 capsule (300 mg total) by mouth 3 (three) times daily. 11/26/17   Biagio Borg, MD    Current Facility-Administered Medications  Medication Dose Route Frequency Provider Last Rate Last Dose  . abiraterone acetate (ZYTIGA) tablet 1,000 mg  1,000 mg Oral QHS Elodia Florence., MD  1,000 mg at 11/26/17 2138  . buPROPion (WELLBUTRIN XL) 24 hr tablet 150 mg  150 mg Oral Daily Elodia Florence., MD   150 mg at 11/27/17 1015  . citalopram (CELEXA) tablet 20 mg  20 mg Oral Daily Elodia Florence., MD   20 mg at 11/27/17 1015  . donepezil (ARICEPT) tablet 10 mg  10 mg Oral QHS Elodia Florence., MD   10 mg at 11/26/17 2138  . gabapentin (NEURONTIN) capsule 300 mg  300 mg Oral TID Elodia Florence., MD   300 mg at 11/27/17 1015  . pantoprazole (PROTONIX) EC tablet 40 mg  40 mg Oral Daily Elodia Florence., MD   40 mg at 11/27/17 1015  . pramipexole (MIRAPEX) tablet 0.5 mg  0.5 mg Oral BID Elodia Florence., MD   0.5 mg at 11/27/17 1015  . pravastatin (PRAVACHOL) tablet 40 mg  40 mg Oral q1800 Elodia Florence., MD      . predniSONE (DELTASONE) tablet 5 mg  5 mg Oral QHS Elodia Florence., MD   5 mg at 11/26/17 2138    Allergies as of 11/26/2017 - Review Complete 11/26/2017  Allergen Reaction Noted  . Amitriptyline hcl Other (See Comments) 01/22/2008  . Diphenhydramine hcl Other (See Comments) 04/05/2007  . Simvastatin Other (See Comments) 07/30/2008  . Clonidine derivatives  06/25/2017  . Tylenol [acetaminophen] Other (See Comments) 03/19/2015    Family History   Problem Relation Age of Onset  . Heart attack Father 24       Smoker  . Heart attack Brother 22  . Lung cancer Sister   . Colon cancer Neg Hx     Social History   Socioeconomic History  . Marital status: Married    Spouse name: Not on file  . Number of children: 2  . Years of education: Not on file  . Highest education level: Not on file  Occupational History  . Occupation: truck driver-retired  Social Needs  . Financial resource strain: Not on file  . Food insecurity:    Worry: Not on file    Inability: Not on file  . Transportation needs:    Medical: Not on file    Non-medical: Not on file  Tobacco Use  . Smoking status: Former Smoker    Packs/day: 1.00    Years: 15.00    Pack years: 15.00    Types: Cigarettes    Last attempt to quit: 02/05/1977    Years since quitting: 40.8  . Smokeless tobacco: Never Used  Substance and Sexual Activity  . Alcohol use: No    Alcohol/week: 0.5 oz    Types: 1 Standard drinks or equivalent per week    Comment: RARE  . Drug use: No  . Sexual activity: Not Currently  Lifestyle  . Physical activity:    Days per week: Not on file    Minutes per session: Not on file  . Stress: Not on file  Relationships  . Social connections:    Talks on phone: Not on file    Gets together: Not on file    Attends religious service: Not on file    Active member of club or organization: Not on file    Attends meetings of clubs or organizations: Not on file    Relationship status: Not on file  . Intimate partner violence:    Fear of current or ex partner: Not on file  Emotionally abused: Not on file    Physically abused: Not on file    Forced sexual activity: Not on file  Other Topics Concern  . Not on file  Social History Narrative  . Not on file    Review of Systems: All systems reviewed and negative except where noted in HPI.  Physical Exam: Vital signs in last 24 hours: Temp:  [97.7 F (36.5 C)-98.5 F (36.9 C)] 98.5 F (36.9  C) (04/16 0431) Pulse Rate:  [55-75] 58 (04/16 0434) Resp:  [15-20] 20 (04/16 0431) BP: (127-165)/(58-74) 127/58 (04/16 0434) SpO2:  [97 %-100 %] 98 % (04/16 0431) Weight:  [135 lb (61.2 kg)-138 lb (62.6 kg)] 135 lb (61.2 kg) (04/16 0434) Last BM Date: 11/26/17 General:   sleepy, well-developed, white male in NAD Psych:  Pleasant, cooperative. Normal affect Eyes:  Pupils equal, sclera clear, no icterus.   Conjunctiva pink. Ears:  Normal auditory acuity. Nose:  No deformity, discharge,  or lesions. Neck:  Supple; no masses Lungs:  Clear throughout to auscultation.   No wheezes, crackles, or rhonchi.  Heart:  Regular rate and rhythm; + murmur, no edema Abdomen:  Soft, non-distended, nontender, BS active, no palp mass    Rectal:  Deferred  Msk:  Symmetrical without gross deformities. . Neurologic:  Alert and  oriented x4;  grossly normal neurologically. Skin:  Intact without significant lesions or rashes..   Intake/Output from previous day: 04/15 0701 - 04/16 0700 In: 412 [Blood:412] Out: 225 [Urine:225] Intake/Output this shift: No intake/output data recorded.  Lab Results: Recent Labs    11/26/17 1200 11/26/17 1506 11/26/17 2323 11/27/17 0536  WBC 6.7 5.4  --  4.5  HGB 6.7 Repeated and verified X2.* 6.6* 7.1* 7.5*  HCT 21.3 Repeated and verified X2.* 22.9* 23.5* 24.6*  PLT 168.0 154  --  133*   BMET Recent Labs    11/26/17 1200 11/26/17 1506 11/27/17 0536  NA 138 137 138  K 3.9 3.6 4.2  CL 107 106 109  CO2 22 22 22   GLUCOSE 86 114* 103*  BUN 34* 35* 27*  CREATININE 1.68* 1.72* 1.62*  CALCIUM 9.1 9.0 8.7*   LFT Recent Labs    11/26/17 1200  11/27/17 0536  PROT 6.5   < > 5.6*  ALBUMIN 4.0   < > 3.1*  AST 11   < > 12*  ALT 6   < > 11*  ALKPHOS 73   < > 62  BILITOT 0.9   < > 1.4*  BILIDIR 0.2  --   --    < > = values in this interval not displayed.   PT/INR Recent Labs    11/27/17 0536  LABPROT 13.0  INR 0.99   Hepatitis Panel No results for  input(s): HEPBSAG, HCVAB, HEPAIGM, HEPBIGM in the last 72 hours.    Studies/Results: Dg Chest 2 View  Result Date: 11/26/2017 CLINICAL DATA:  78 y/o  M; dyspnea. EXAM: CHEST - 2 VIEW COMPARISON:  11/09/2016 chest radiograph. FINDINGS: Stable mildly enlarged cardiac silhouette given differences in technique. Aortic atherosclerosis with calcification. Clear lungs. No pleural effusion or pneumothorax. No acute osseous abnormality is evident. Osteoarthrosis of the shoulder joints. IMPRESSION: No acute pulmonary process identified. Electronically Signed   By: Kristine Garbe M.D.   On: 11/26/2017 17:54     Tye Savoy, NP-C @  11/27/2017, 11:51 AM  Pager number [phone]

## 2017-11-27 NOTE — Consult Note (Addendum)
Referring Provider: [facility]  Primary Care Physician:  Biagio Borg, MD Primary Gastroenterologist:  Zenovia Jarred,  MD  Reason for Consultation:    anemia   ASSESSMENT AND PLAN:    1. Iron deficiency anemia. 77 yo male with multiple medical problems not limited to CKD, CAD / PCI on chronic plavix, dementia and hx of prostate / bladder cancer. admitted with acute on chronic anemia, heme positive stools on plavix and ASA. Ferritin in 7.  -hgb up from 6.7 to 7.5 this am post one unit of blood last evening.  -patient has no upper or lower GI symptoms to point in any direction of potential GI blood loss. He will need upper and lower endoscopic workup and possible even small bowel evaluation if EGD / colon negative. Unfortunately his last dose of plavix was just two days ago so will not be doing any polypectomies.   2. Hx of colon adenomas, due for surveillance colonoscopy Oct 2019.   3. GERD, on daily PPI   HPI: Victor Davis is a 77 y.o. male with a PMH of dementia , CAD s/p PCI in 2013, PVD, prostate cancer, GERD, diverticulosis,aAnd adenomatous colon polyps found on last colonoscopy in 2014.  No high-grade dysplasia was present.  Patient due for surveillance colonoscopy October 2019.    Patient was seen this PCP yesterday for wellness checkup, labs revealed hemoglobin of 6.7, down from his baseline of around 10.  Stool is heme positive. Patient denies overt GI blood loss and wife validates it. He hasn't had any abdominal pain, nausea, vomiting. Weight is overall stable. No bowel changes. No hematuria, epistaxis. He takes a baby asa everyday, no other NSAIDs. He has had some dizziness, no chest pain. He has been getting winded lately when doing activities at home.      Past Medical History:  Diagnosis Date  . Cancer ([place])    lung and prostate cancer per wife  . 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 CKD (chronic kidney disease), stage III ([place])   . Coronary artery disease    a. DES to LAD and Cx 02/2012.  . Emphysema 04/01/2012   By CT chest, august 2013  . ERECTILE DYSFUNCTION 04/05/2007  . GERD (gastroesophageal reflux disease)   . HYPERLIPIDEMIA 07/30/2008  . HYPERSOMNIA 10/02/2007  . Impaired glucose tolerance 02/09/2011  . INSOMNIA-SLEEP DISORDER-UNSPEC 10/22/2009  . LOW BACK PAIN 04/05/2007  . Mild carotid artery disease ([place])    a. 1-39% bilaterally by duplex 11/2016.  . OSTEOARTHRITIS 04/05/2007  . PEPTIC ULCER DISEASE 04/05/2007  . PROSTATE CANCER, HX OF 04/05/2007  . RESTLESS LEG SYNDROME 05/15/2007  . RHINITIS, ALLERGIC NOS 05/15/2007  . Sinus bradycardia 10/24/2010   a. prior h/o HR 40s, clonidine stopped 05/2017 due to this.  [name] Unspecified visual loss 07/15/2008    Past Surgical History:  Procedure Laterality Date  . CARDIAC CATHETERIZATION    . EYE SURGERY     right  . Inguinal herniorrhapy left  2002   x 2  . KNEE SURGERY     left  . Left hip replacement    . LYMPH NODE DISSECTION Right 03/22/2015   Procedure: LYMPH NODE DISSECTION;  Surgeon: Melrose Nakayama, MD;  Location: [place];  Service: Thoracic;  Laterality: Right;  . PERCUTANEOUS CORONARY STENT INTERVENTION (PCI-S) N/A 03/08/2012   Procedure: PERCUTANEOUS CORONARY STENT INTERVENTION (PCI-S);  Surgeon: Burnell Blanks, MD;  Location: [facility] CATH LAB;  Service: Cardiovascular;  Laterality: N/A;  . PROSTATECTOMY  2002  radical  . Right hip replacement  09/22/09  . ROTATOR CUFF REPAIR     left  . s/p ventral surgery  2009  . SEGMENTECOMY Right 03/22/2015   Procedure: RIGHT LOWER LOBE SUPERIOR SEGMENTECTOMY;  Surgeon: Melrose Nakayama, MD;  Location: [place];  Service: Thoracic;  Laterality: Right;  [name] VIDEO ASSISTED THORACOSCOPY Right 03/22/2015   Procedure: RIGHT VIDEO ASSISTED THORACOSCOPY;  Surgeon: Melrose Nakayama, MD;  Location: [place];  Service: Thoracic;  Laterality: Right;    Prior to Admission medications   Medication Sig Start Date End Date Taking? Authorizing Provider    abiraterone acetate (ZYTIGA) 500 MG tablet Take 1,000 mg by mouth daily. Take on an empty stomach 1 hour before or 2 hours after a meal.   Yes [provider]  amLODipine (NORVASC) 10 MG tablet TAKE 1 TABLET BY MOUTH  DAILY Patient taking differently: Take 10mg  by mouth daily 10/25/17  Yes Biagio Borg, MD  Ascorbic Acid (VITAMIN C) 100 MG tablet Take 250 mg by mouth daily.   Yes [provider]  aspirin 81 MG chewable tablet Chew 81 mg daily by mouth.   Yes [provider]  citalopram (CELEXA) 20 MG tablet Take 20 mg daily by mouth.   Yes [provider]  clopidogrel (PLAVIX) 75 MG tablet Take 75 mg daily by mouth.  05/21/17  Yes [provider]  donepezil (ARICEPT) 10 MG tablet Take 1 tablet (10 mg total) by mouth at bedtime. 11/26/17  Yes Biagio Borg, MD  gabapentin (NEURONTIN) 100 MG capsule Take 100 mg by mouth 3 (three) times daily.   Yes [provider]  hydrALAZINE (APRESOLINE) 25 MG tablet TAKE 1 TABLET (25 MG TOTAL) BY MOUTH EVERY 8 (EIGHT) HOURS. 06/10/17  Yes [provider]  isosorbide mononitrate (IMDUR) 30 MG 24 hr tablet TAKE 1 TABLET BY MOUTH  DAILY Patient taking differently: Take 30mg  by mouth daily 10/19/17  Yes Biagio Borg, MD  lovastatin (MEVACOR) 40 MG tablet Take 40 mg at bedtime by mouth.  05/21/17  Yes [provider]  meclizine (ANTIVERT) 25 MG tablet 12.5-25 mg as needed for dizziness. TAKE 1/2 - 1 TABLET BY MOUTH THREE TIMES A DAY AS NEEDED FOR DIZINESS    Yes [provider]  nitroGLYCERIN (NITROSTAT) 0.4 MG SL tablet Place 1 tablet (0.4 mg total) under the tongue every 5 (five) minutes as needed for chest pain. 10/01/17  Yes Burnell Blanks, MD  pantoprazole (PROTONIX) 40 MG tablet TAKE 1 TABLET BY MOUTH  DAILY Patient taking differently: Take 40mg  by mouth daily 10/25/17  Yes Biagio Borg, MD  pramipexole (MIRAPEX) 0.5 MG tablet Take 0.5 mg by mouth 3 (three) times daily.  09/06/17   Yes [provider]  predniSONE (DELTASONE) 5 MG tablet Take 5 mg daily with breakfast by mouth.   Yes [provider]  buPROPion (WELLBUTRIN XL) 150 MG 24 hr tablet Take 1 tablet (150 mg total) by mouth daily. 11/26/17   Biagio Borg, MD  fenoprofen (NALFON) 600 MG TABS tablet Take 600 mg by mouth.    [provider]  gabapentin (NEURONTIN) 300 MG capsule Take 1 capsule (300 mg total) by mouth 3 (three) times daily. 11/26/17   Biagio Borg, MD    Current Facility-Administered Medications  Medication Dose Route Frequency Provider Last Rate Last Dose  . abiraterone acetate (ZYTIGA) tablet 1,000 mg  1,000 mg Oral QHS Elodia Florence., MD  1,000 mg at 11/26/17 2138  . buPROPion (WELLBUTRIN XL) 24 hr tablet 150 mg  150 mg Oral Daily Elodia Florence., MD   150 mg at 11/27/17 1015  . citalopram (CELEXA) tablet 20 mg  20 mg Oral Daily Elodia Florence., MD   20 mg at 11/27/17 1015  . donepezil (ARICEPT) tablet 10 mg  10 mg Oral QHS Elodia Florence., MD   10 mg at 11/26/17 2138  . gabapentin (NEURONTIN) capsule 300 mg  300 mg Oral TID Elodia Florence., MD   300 mg at 11/27/17 1015  . pantoprazole (PROTONIX) EC tablet 40 mg  40 mg Oral Daily Elodia Florence., MD   40 mg at 11/27/17 1015  . pramipexole (MIRAPEX) tablet 0.5 mg  0.5 mg Oral BID Elodia Florence., MD   0.5 mg at 11/27/17 1015  . pravastatin (PRAVACHOL) tablet 40 mg  40 mg Oral q1800 Elodia Florence., MD      . predniSONE (DELTASONE) tablet 5 mg  5 mg Oral QHS Elodia Florence., MD   5 mg at 11/26/17 2138    Allergies as of 11/26/2017 - Review Complete 11/26/2017  Allergen Reaction Noted  . Amitriptyline hcl Other (See Comments) 01/22/2008  . Diphenhydramine hcl Other (See Comments) 04/05/2007  . Simvastatin Other (See Comments) 07/30/2008  . Clonidine derivatives  06/25/2017  . Tylenol [acetaminophen] Other (See Comments) 03/19/2015    Family History   Problem Relation Age of Onset  . Heart attack Father 25       Smoker  . Heart attack Brother 1  . Lung cancer Sister   . Colon cancer Neg Hx     Social History   Socioeconomic History  . Marital status: Married    Spouse name: Not on file  . Number of children: 2  . Years of education: Not on file  . Highest education level: Not on file  Occupational History  . Occupation: truck driver-retired  Social Needs  . Financial resource strain: Not on file  . Food insecurity:    Worry: Not on file    Inability: Not on file  . Transportation needs:    Medical: Not on file    Non-medical: Not on file  Tobacco Use  . Smoking status: Former Smoker    Packs/day: 1.00    Years: 15.00    Pack years: 15.00    Types: Cigarettes    Last attempt to quit: 02/05/1977    Years since quitting: 40.8  . Smokeless tobacco: Never Used  Substance and Sexual Activity  . Alcohol use: No    Alcohol/week: 0.5 oz    Types: 1 Standard drinks or equivalent per week    Comment: RARE  . Drug use: No  . Sexual activity: Not Currently  Lifestyle  . Physical activity:    Days per week: Not on file    Minutes per session: Not on file  . Stress: Not on file  Relationships  . Social connections:    Talks on phone: Not on file    Gets together: Not on file    Attends religious service: Not on file    Active member of club or organization: Not on file    Attends meetings of clubs or organizations: Not on file    Relationship status: Not on file  . Intimate partner violence:    Fear of current or ex partner: Not on file  Emotionally abused: Not on file    Physically abused: Not on file    Forced sexual activity: Not on file  Other Topics Concern  . Not on file  Social History Narrative  . Not on file    Review of Systems: All systems reviewed and negative except where noted in HPI.  Physical Exam: Vital signs in last 24 hours: Temp:  [97.7 F (36.5 C)-98.5 F (36.9 C)] 98.5 F (36.9  C) (04/16 0431) Pulse Rate:  [55-75] 58 (04/16 0434) Resp:  [15-20] 20 (04/16 0431) BP: (127-165)/(58-74) 127/58 (04/16 0434) SpO2:  [97 %-100 %] 98 % (04/16 0431) Weight:  [135 lb (61.2 kg)-138 lb (62.6 kg)] 135 lb (61.2 kg) (04/16 0434) Last BM Date: 11/26/17 General:   sleepy, well-developed, white male in NAD Psych:  Pleasant, cooperative. Normal affect Eyes:  Pupils equal, sclera clear, no icterus.   Conjunctiva pink. Ears:  Normal auditory acuity. Nose:  No deformity, discharge,  or lesions. Neck:  Supple; no masses Lungs:  Clear throughout to auscultation.   No wheezes, crackles, or rhonchi.  Heart:  Regular rate and rhythm; + murmur, no edema Abdomen:  Soft, non-distended, nontender, BS active, no palp mass    Rectal:  Deferred  Msk:  Symmetrical without gross deformities. . Neurologic:  Alert and  oriented x4;  grossly normal neurologically. Skin:  Intact without significant lesions or rashes..   Intake/Output from previous day: 04/15 0701 - 04/16 0700 In: 412 [Blood:412] Out: 225 [Urine:225] Intake/Output this shift: No intake/output data recorded.  Lab Results: Recent Labs    11/26/17 1200 11/26/17 1506 11/26/17 2323 11/27/17 0536  WBC 6.7 5.4  --  4.5  HGB 6.7 Repeated and verified X2.* 6.6* 7.1* 7.5*  HCT 21.3 Repeated and verified X2.* 22.9* 23.5* 24.6*  PLT 168.0 154  --  133*   BMET Recent Labs    11/26/17 1200 11/26/17 1506 11/27/17 0536  NA 138 137 138  K 3.9 3.6 4.2  CL 107 106 109  CO2 22 22 22   GLUCOSE 86 114* 103*  BUN 34* 35* 27*  CREATININE 1.68* 1.72* 1.62*  CALCIUM 9.1 9.0 8.7*   LFT Recent Labs    11/26/17 1200  11/27/17 0536  PROT 6.5   < > 5.6*  ALBUMIN 4.0   < > 3.1*  AST 11   < > 12*  ALT 6   < > 11*  ALKPHOS 73   < > 62  BILITOT 0.9   < > 1.4*  BILIDIR 0.2  --   --    < > = values in this interval not displayed.   PT/INR Recent Labs    11/27/17 0536  LABPROT 13.0  INR 0.99   Hepatitis Panel No results for  input(s): HEPBSAG, HCVAB, HEPAIGM, HEPBIGM in the last 72 hours.    Studies/Results: Dg Chest 2 View  Result Date: 11/26/2017 CLINICAL DATA:  77 y/o  M; dyspnea. EXAM: CHEST - 2 VIEW COMPARISON:  11/09/2016 chest radiograph. FINDINGS: Stable mildly enlarged cardiac silhouette given differences in technique. Aortic atherosclerosis with calcification. Clear lungs. No pleural effusion or pneumothorax. No acute osseous abnormality is evident. Osteoarthrosis of the shoulder joints. IMPRESSION: No acute pulmonary process identified. Electronically Signed   By: Kristine Garbe M.D.   On: 11/26/2017 17:54     Tye Savoy, NP-C @  11/27/2017, 11:51 AM  Pager number [phone]

## 2017-11-28 ENCOUNTER — Encounter (HOSPITAL_COMMUNITY)
Admission: EM | Disposition: A | Discharge status: Home / Self Care | Payer: Self-pay | Source: Home / Self Care | Attending: Emergency Medicine

## 2017-11-28 ENCOUNTER — Telehealth: Payer: Self-pay

## 2017-11-28 ENCOUNTER — Observation Stay (HOSPITAL_COMMUNITY): Payer: Medicare Other | Admitting: Anesthesiology

## 2017-11-28 ENCOUNTER — Encounter (HOSPITAL_COMMUNITY): Payer: Self-pay | Admitting: *Deleted

## 2017-11-28 DIAGNOSIS — N183 Chronic kidney disease, stage 3 unspecified: Secondary | ICD-10-CM

## 2017-11-28 DIAGNOSIS — D649 Anemia, unspecified: Secondary | ICD-10-CM | POA: Diagnosis not present

## 2017-11-28 DIAGNOSIS — F0150 Vascular dementia without behavioral disturbance: Secondary | ICD-10-CM

## 2017-11-28 DIAGNOSIS — R195 Other fecal abnormalities: Secondary | ICD-10-CM | POA: Diagnosis not present

## 2017-11-28 DIAGNOSIS — D509 Iron deficiency anemia, unspecified: Secondary | ICD-10-CM | POA: Diagnosis not present

## 2017-11-28 DIAGNOSIS — K5730 Diverticulosis of large intestine without perforation or abscess without bleeding: Secondary | ICD-10-CM | POA: Diagnosis not present

## 2017-11-28 DIAGNOSIS — K648 Other hemorrhoids: Secondary | ICD-10-CM | POA: Diagnosis not present

## 2017-11-28 DIAGNOSIS — I2510 Atherosclerotic heart disease of native coronary artery without angina pectoris: Secondary | ICD-10-CM | POA: Diagnosis not present

## 2017-11-28 DIAGNOSIS — K6289 Other specified diseases of anus and rectum: Secondary | ICD-10-CM | POA: Diagnosis not present

## 2017-11-28 DIAGNOSIS — R001 Bradycardia, unspecified: Secondary | ICD-10-CM

## 2017-11-28 DIAGNOSIS — D123 Benign neoplasm of transverse colon: Secondary | ICD-10-CM | POA: Diagnosis not present

## 2017-11-28 DIAGNOSIS — K635 Polyp of colon: Secondary | ICD-10-CM | POA: Diagnosis not present

## 2017-11-28 DIAGNOSIS — D122 Benign neoplasm of ascending colon: Secondary | ICD-10-CM | POA: Diagnosis not present

## 2017-11-28 DIAGNOSIS — K5520 Angiodysplasia of colon without hemorrhage: Secondary | ICD-10-CM | POA: Diagnosis not present

## 2017-11-28 HISTORY — PX: COLONOSCOPY: SHX5424

## 2017-11-28 HISTORY — PX: ESOPHAGOGASTRODUODENOSCOPY: SHX5428

## 2017-11-28 LAB — CBC
HCT: 26.1 % — ABNORMAL LOW (ref 39.0–52.0)
Hemoglobin: 8 g/dL — ABNORMAL LOW (ref 13.0–17.0)
MCH: 25.1 pg — ABNORMAL LOW (ref 26.0–34.0)
MCHC: 30.7 g/dL (ref 30.0–36.0)
MCV: 81.8 fL (ref 78.0–100.0)
Platelets: 122 10*3/uL — ABNORMAL LOW (ref 150–400)
RBC: 3.19 MIL/uL — ABNORMAL LOW (ref 4.22–5.81)
RDW: 15.1 % (ref 11.5–15.5)
WBC: 4.5 10*3/uL (ref 4.0–10.5)

## 2017-11-28 LAB — BASIC METABOLIC PANEL WITH GFR
BUN: 20 mg/dL (ref 6–20)
Chloride: 113 mmol/L — ABNORMAL HIGH (ref 101–111)
Creatinine, Ser: 1.7 mg/dL — ABNORMAL HIGH (ref 0.61–1.24)
Sodium: 143 mmol/L (ref 135–145)

## 2017-11-28 LAB — BASIC METABOLIC PANEL
Anion gap: 8 (ref 5–15)
CO2: 22 mmol/L (ref 22–32)
Calcium: 8.8 mg/dL — ABNORMAL LOW (ref 8.9–10.3)
GFR calc Af Amer: 43 mL/min — ABNORMAL LOW (ref >60)
GFR calc non Af Amer: 37 mL/min — ABNORMAL LOW (ref >60)
Glucose, Bld: 117 mg/dL — ABNORMAL HIGH (ref 65–99)
Potassium: 5.2 mmol/L — ABNORMAL HIGH (ref 3.5–5.1)

## 2017-11-28 LAB — POTASSIUM: POTASSIUM: 4.5 mmol/L (ref 3.5–5.1)

## 2017-11-28 SURGERY — COLONOSCOPY
Anesthesia: Monitor Anesthesia Care

## 2017-11-28 MED ORDER — PROPOFOL 10 MG/ML IV BOLUS
INTRAVENOUS | Status: AC
  Filled 2017-11-28: qty 20

## 2017-11-28 MED ORDER — ASPIRIN 81 MG PO CHEW: 81.0000 mg | CHEWABLE_TABLET | Freq: Every day | ORAL | 0 refills | Status: DC

## 2017-11-28 MED ORDER — LIDOCAINE 2% (20 MG/ML) 5 ML SYRINGE
INTRAMUSCULAR | Status: DC | PRN
  Administered 2017-11-28: 50 mg via INTRAVENOUS

## 2017-11-28 MED ORDER — PROPOFOL 500 MG/50ML IV EMUL
INTRAVENOUS | Status: DC | PRN
  Administered 2017-11-28: 125 ug/kg/min via INTRAVENOUS

## 2017-11-28 MED ORDER — CHLORHEXIDINE GLUCONATE CLOTH 2 % EX PADS: 6.0000 | MEDICATED_PAD | Freq: Every day | CUTANEOUS | 0 refills | Status: AC

## 2017-11-28 MED ORDER — GABAPENTIN 300 MG PO CAPS: 300.0000 mg | ORAL_CAPSULE | Freq: Two times a day (BID) | ORAL | 0 refills | Status: DC

## 2017-11-28 MED ORDER — HYDRALAZINE HCL 25 MG PO TABS
25.0000 mg | ORAL_TABLET | Freq: Three times a day (TID) | ORAL | Status: DC
  Administered 2017-11-28: 25 mg via ORAL
  Filled 2017-11-28: qty 1

## 2017-11-28 MED ORDER — SODIUM CHLORIDE 0.9 % IV SOLN
510.0000 mg | Freq: Once | INTRAVENOUS | Status: AC
  Administered 2017-11-28: 510 mg via INTRAVENOUS
  Filled 2017-11-28: qty 17

## 2017-11-28 MED ORDER — SODIUM CHLORIDE 0.9 % IV SOLN
INTRAVENOUS | Status: DC
  Administered 2017-11-28: 11:00:00 via INTRAVENOUS
  Administered 2017-11-28: 500 mL via INTRAVENOUS

## 2017-11-28 MED ORDER — PROPOFOL 10 MG/ML IV BOLUS
INTRAVENOUS | Status: DC | PRN
  Administered 2017-11-28: 20 mg via INTRAVENOUS

## 2017-11-28 MED ORDER — VITAMIN B-1 50 MG PO TABS: 50.0000 mg | ORAL_TABLET | Freq: Every day | ORAL | 0 refills | Status: DC

## 2017-11-28 MED ORDER — CLOPIDOGREL BISULFATE 75 MG PO TABS: 75.0000 mg | ORAL_TABLET | Freq: Every day | ORAL | 0 refills | Status: DC

## 2017-11-28 MED ORDER — DONEPEZIL HCL 5 MG PO TABS: 5.0000 mg | ORAL_TABLET | Freq: Every day | ORAL | 0 refills | Status: DC

## 2017-11-28 MED ORDER — PROPOFOL 10 MG/ML IV BOLUS
INTRAVENOUS | Status: AC
  Filled 2017-11-28: qty 40

## 2017-11-28 MED ORDER — GLYCOPYRROLATE 0.2 MG/ML IJ SOLN
INTRAMUSCULAR | Status: DC | PRN
Start: 2017-11-28 — End: 2017-11-28
  Administered 2017-11-28: 0.2 mg via INTRAVENOUS

## 2017-11-28 MED ORDER — GABAPENTIN 100 MG PO CAPS: 300.0000 mg | ORAL_CAPSULE | Freq: Two times a day (BID) | ORAL | Status: DC

## 2017-11-28 MED ORDER — ISOSORBIDE MONONITRATE ER 30 MG PO TB24
30.0000 mg | ORAL_TABLET | Freq: Every day | ORAL | Status: DC
  Administered 2017-11-28: 30 mg via ORAL
  Filled 2017-11-28: qty 1

## 2017-11-28 MED ORDER — DONEPEZIL HCL 5 MG PO TABS: 5.0000 mg | ORAL_TABLET | Freq: Every day | ORAL | Status: DC

## 2017-11-28 NOTE — Interval H&P Note (Signed)
History and Physical Interval Note:  11/28/2017 10:48 AM  Victor Davis  has presented today for surgery, with the diagnosis of anemia , heme positive stool  The various methods of treatment have been discussed with the patient and family. After consideration of risks, benefits and other options for treatment, the patient has consented to  Procedure(s): COLONOSCOPY (N/A) ESOPHAGOGASTRODUODENOSCOPY (EGD) (N/A) as a surgical intervention .  The patient's history has been reviewed, patient examined, no change in status, stable for surgery.  I have reviewed the patient's chart and labs.  Questions were answered to the patient's satisfaction.     [place]

## 2017-11-28 NOTE — Progress Notes (Signed)
Walking HR was 69 bpm.

## 2017-11-28 NOTE — Progress Notes (Signed)
    I have consulted Pharmacy for dose of IV iron. Following that patient is stable from GI standpoint for discharge.   He needs CBC in two weeks at our office, the order will be in computer. Labs drawn in basement of our building across street in [place].   Please continue to hold Plavix for 3-5 days.   He will need repeat colonoscopy for polypectomy in the future, will discuss at time of office visit.

## 2017-11-28 NOTE — Telephone Encounter (Signed)
Order in Epic!

## 2017-11-28 NOTE — Op Note (Signed)
[facility] Patient Name: Victor Davis Procedure Date: 11/28/2017 MRN: [id number] Attending MD: Carlota Raspberry. Armbruster MD, MD Date of Birth: 1940/10/18 CSN: [id number] Age: 77 Admit Type: Outpatient Procedure:                Upper GI endoscopy Indications:              Iron deficiency anemia, Occult blood in stool Providers:                Carlota Raspberry. Armbruster MD, MD, Vista Lawman, RN,                            Charolette Child, Technician Referring MD:              Medicines:                Monitored Anesthesia Care Complications:            No immediate complications. Estimated blood loss:                            Minimal. Estimated Blood Loss:     Estimated blood loss was minimal. Procedure:                Pre-Anesthesia Assessment:                           - Prior to the procedure, a History and Physical                            was performed, and patient medications and                            allergies were reviewed. The patient's tolerance of                            previous anesthesia was also reviewed. The risks                            and benefits of the procedure and the sedation                            options and risks were discussed with the patient.                            All questions were answered, and informed consent                            was obtained. Prior Anticoagulants: The patient has                            taken Plavix (clopidogrel), last dose was 2 days                            prior to procedure. ASA Grade Assessment: III - A  patient with severe systemic disease. After                            reviewing the risks and benefits, the patient was                            deemed in satisfactory condition to undergo the                            procedure.                           After obtaining informed consent, the endoscope was                            passed under direct vision.  Throughout the                            procedure, the patient's blood pressure, pulse, and                            oxygen saturations were monitored continuously. The                            EG-[id number] ([id number]) scope was introduced through the                            mouth, and advanced to the second part of duodenum.                            The upper GI endoscopy was accomplished without                            difficulty. The patient tolerated the procedure                            well. Scope In: Scope Out: Findings:      Esophagogastric landmarks were identified: the Z-line was found at 38       cm, the gastroesophageal junction was found at 38 cm and the upper       extent of the gastric folds was found at 38 cm from the incisors.      The exam of the esophagus was otherwise normal.      The entire examined stomach was normal.      The duodenal bulb and second portion of the duodenum were normal. Impression:               - Esophagogastric landmarks identified.                           - Normal esophagus                           - Normal stomach.                           -  Normal duodenal bulb and second portion of the                            duodenum.                           No pathology noted to cause anemia on EGD Moderate Sedation:      No moderate sedation, case performed with MAC Recommendation:           - Return patient to hospital ward for ongoing care.                           - Recommendations per colonoscopy note Procedure Code(s):        --- Professional ---                           [phone], Esophagogastroduodenoscopy, flexible,                            transoral; diagnostic, including collection of                            specimen(s) by brushing or washing, when performed                            (separate procedure) Diagnosis Code(s):        --- Professional ---                           D50.9, Iron deficiency anemia,  unspecified                           R19.5, Other fecal abnormalities CPT copyright 2017 American [employer]. All rights reserved. The codes documented in this report are preliminary and upon coder review may  be revised to meet current compliance requirements. Remo Lipps P. Armbruster MD, MD 11/28/2017 12:29:26 PM This report has been signed electronically. Number of Addenda: 0

## 2017-11-28 NOTE — Progress Notes (Signed)
Patient being discharged to home today. Discharge instructions were gone over with patient and wife. IV sites were removed. Patient and wife aware of prescriptions and the need to follow-up. All questions were answered. Patient wheeled down by NT.

## 2017-11-28 NOTE — Op Note (Signed)
[facility] Patient Name: Victor Davis Procedure Date: 11/28/2017 MRN: [id number] Attending MD: Carlota Raspberry. Armbruster MD, MD Date of Birth: 07/28/1941 CSN: [id number] Age: 77 Admit Type: Outpatient Procedure:                Colonoscopy Indications:              Gastrointestinal occult blood loss, Iron deficiency                            anemia, on plavix and aspirin Providers:                Carlota Raspberry. Armbruster MD, MD, Vista Lawman, RN,                            Charolette Child, Technician Referring MD:              Medicines:                Monitored Anesthesia Care Complications:            No immediate complications. Estimated blood loss:                            Minimal. Estimated Blood Loss:     Estimated blood loss was minimal. Procedure:                Pre-Anesthesia Assessment:                           - Prior to the procedure, a History and Physical                            was performed, and patient medications and                            allergies were reviewed. The patient's tolerance of                            previous anesthesia was also reviewed. The risks                            and benefits of the procedure and the sedation                            options and risks were discussed with the patient.                            All questions were answered, and informed consent                            was obtained. Prior Anticoagulants: The patient has                            taken Plavix (clopidogrel), last dose was 2 days  prior to procedure. ASA Grade Assessment: III - A                            patient with severe systemic disease. After                            reviewing the risks and benefits, the patient was                            deemed in satisfactory condition to undergo the                            procedure.                           After obtaining informed consent, the colonoscope                            was passed under direct vision. Throughout the                            procedure, the patient's blood pressure, pulse, and                            oxygen saturations were monitored continuously. The                            EC-[id number] ([id number]) scope was introduced through                            the anus and advanced to the the terminal ileum,                            with identification of the appendiceal orifice and                            IC valve. The colonoscopy was performed without                            difficulty. The patient tolerated the procedure                            well. The quality of the bowel preparation was                            fair. The terminal ileum, ileocecal valve,                            appendiceal orifice, and rectum were photographed. Scope In: 11:43:26 AM Scope Out: 12:10:05 PM Scope Withdrawal Time: 0 hours 16 minutes 53 seconds  Total Procedure Duration: 0 hours 26 minutes 39 seconds  Findings:      The perianal and digital rectal examinations were normal.      The terminal ileum appeared normal.  Many medium-mouthed diverticula were found in the transverse colon and       left colon.      A 7 mm polyp was found in the ascending colon. The polyp was sessile.      Two sessile polyps were found in the transverse colon. The polyps were 3       to 4 mm in size.      A single small localized angiodysplastic lesion without bleeding was       found in the rectum. Fulguration to ablate the lesion to prevent       bleeding by argon plasma was successful.      Internal hemorrhoids were found during retroflexion.      The prep was fair, other very small or flat lesions may not have been       appreciated due to prep. The exam was otherwise without abnormality. Impression:               - Preparation of the colon was fair.                           - The examined portion of the ileum was normal.                            - Diverticulosis in the transverse colon and in the                            left colon.                           - One 7 mm polyp in the ascending colon.                           - Two 3 to 4 mm polyps in the transverse colon.                           - A single non-bleeding colonic angiodysplastic                            lesion. Treated with argon plasma coagulation (APC).                           - Internal hemorrhoids.                           - The examination was otherwise normal.                           Very small rectal AVM noted and treated, unclear if                            this is related to anemia or not. It's possible the                            patient has small bowel AVMs / small bowel source  contributing to anemia in the setting of aspirin                            and plavix. Polyps not removed today given plavix                            use. Moderate Sedation:      No moderate sedation, case performed with MAC Recommendation:           - Return patient to hospital ward for ongoing care.                           - Advance diet as tolerated.                           - Continue present medications. Okay to continue                            aspirin. If okay to hold plavix for a few days, I                            would ensure Hgb stable prior to resuming.                           - Recommend IV iron infusion prior to discharge                           - Hgb stable, I think okay to be discharged home                            with close follow up. If Hgb continues to downtrend                            despite IV iron, would recommend capsule endoscopy                            as outpatient                           - He will warrant repeat colonoscopy with                            polypectomy as outpatient when plavix is out of his                            system. We will coordinate follow up  with Dr.                            Hilarie Fredrickson, primary GI. Procedure Code(s):        --- Professional ---                           [phone], Colonoscopy, flexible; with control of  bleeding, any method Diagnosis Code(s):        --- Professional ---                           K64.8, Other hemorrhoids                           D12.2, Benign neoplasm of ascending colon                           D12.3, Benign neoplasm of transverse colon (hepatic                            flexure or splenic flexure)                           K55.20, Angiodysplasia of colon without hemorrhage                           R19.5, Other fecal abnormalities                           D50.9, Iron deficiency anemia, unspecified                           K57.30, Diverticulosis of large intestine without                            perforation or abscess without bleeding CPT copyright 2017 American Medical Association. All rights reserved. The codes documented in this report are preliminary and upon coder review may  be revised to meet current compliance requirements. Remo Lipps P. Armbruster MD, MD 11/28/2017 12:26:33 PM This report has been signed electronically. Number of Addenda: 0

## 2017-11-28 NOTE — Anesthesia Preprocedure Evaluation (Signed)
Anesthesia Evaluation  Patient identified by MRN, date of birth, ID band Patient awake    Reviewed: Allergy & Precautions, NPO status , Patient's Chart, lab work & pertinent test results  Airway Mallampati: II  TM Distance: >3 FB Neck ROM: Full    Dental no notable dental hx.    Pulmonary COPD, former smoker,    Pulmonary exam normal breath sounds clear to auscultation       Cardiovascular hypertension, + CAD and + Cardiac Stents  Normal cardiovascular exam Rhythm:Regular Rate:Normal     Neuro/Psych Dementia CVA negative neurological ROS  negative psych ROS   GI/Hepatic Neg liver ROS, PUD, GERD  ,  Endo/Other  negative endocrine ROS  Renal/GU negative Renal ROS  negative genitourinary   Musculoskeletal negative musculoskeletal ROS (+)   Abdominal   Peds negative pediatric ROS (+)  Hematology negative hematology ROS (+)   Anesthesia Other Findings   Reproductive/Obstetrics negative OB ROS                             Anesthesia Physical Anesthesia Plan  ASA: III  Anesthesia Plan: MAC   Post-op Pain Management:    Induction:   PONV Risk Score and Plan:   Airway Management Planned: Nasal Cannula  Additional Equipment:   Intra-op Plan:   Post-operative Plan:   Informed Consent: I have reviewed the patients History and Physical, chart, labs and discussed the procedure including the risks, benefits and alternatives for the proposed anesthesia with the patient or authorized representative who has indicated his/her understanding and acceptance.   Dental advisory given  Plan Discussed with:   Anesthesia Plan Comments:         Anesthesia Quick Evaluation

## 2017-11-28 NOTE — Anesthesia Postprocedure Evaluation (Signed)
Anesthesia Post Note  Patient: DARON STUTZ  Procedure(s) Performed: COLONOSCOPY (N/A ) ESOPHAGOGASTRODUODENOSCOPY (EGD) (N/A )     Patient location during evaluation: Endoscopy Anesthesia Type: MAC Level of consciousness: awake and alert Pain management: pain level controlled Vital Signs Assessment: post-procedure vital signs reviewed and stable Respiratory status: spontaneous breathing, nonlabored ventilation, respiratory function stable and patient connected to nasal cannula oxygen Cardiovascular status: stable and blood pressure returned to baseline Postop Assessment: no apparent nausea or vomiting Anesthetic complications: no    Last Vitals:  Vitals:   11/28/17 1250 11/28/17 1308  BP: (!) 189/71 (!) 178/81  Pulse: 60 60  Resp: 17 14  Temp:  (!) 36.3 C  SpO2: 97% 100%    Last Pain:  Vitals:   11/28/17 1308  TempSrc: Oral  PainSc:                  Montez Hageman

## 2017-11-28 NOTE — Discharge Summary (Addendum)
Discharge Summary  Victor Davis [id number]:[id number] DOB: May 14, 1941  PCP: Biagio Borg, MD  Admit date: 11/26/2017 Discharge date: 11/28/2017  Time spent: 46mins, more than 50% time spent on coordination of care.  Recommendations for Outpatient Follow-up:  1. F/u with PMD within a week  for hospital discharge follow up, repeat cbc/bmp at follow up 2. F/u with [facility] 3. F/u with urology  Discharge Diagnoses:  Active Hospital Problems   Diagnosis Date Noted  . Symptomatic anemia 11/26/2017    Resolved Hospital Problems  No resolved problems to display.    Discharge Condition: stable  Diet recommendation: heart healthy/carb modified  Filed Weights   11/26/17 1414 11/27/17 0434  Weight: 62.6 kg (138 lb) 61.2 kg (135 lb)    History of present illness: ( per admitting MD Dr Florene Glen) PCP: Biagio Borg, MD  Patient coming from: home  I have personally briefly reviewed patient's old medical records in [place]  Chief Complaint: abnormal labs  HPI: Victor Davis is a 77 y.o. male with medical history significant of lung cancer and prostate cancer, coronary disease status post stent x4, chronic kidney disease, stroke, hypertension and several other medical problems presenting after being told to present by his primary care physician for abnormal labs with symptomatic anemia.  He notes for the past month he has been more tired.  He also notes lightheadedness and recurrent falls over the past couple weeks.  His last fall was about 2 weeks ago.  He denies any history of head trauma or loss of consciousness.  Otherwise, he denies symptoms.  He denies any fevers, chills, cough, cold, chest pain, shortness of breath, abdominal pain, nausea, vomiting.  He saw his physician recently and was told to come in because of abnormal labs.  ED Course: Labs, CXR, rectal exam.  Admit for symptomatic anemia.     Hospital Course:  Active Problems:   Symptomatic anemia  Symptomatic  iron deficiency anemia -hgb 6.6 on presentation -possibly from chronic GI Blood loss , he also has anemia of chronic disease, retic count is inappropriately low -he presented to the hospital due to weakness, worsening of depression -diagnostic EGD/colonoscopy no acute bleeding, does has polyps, will need repeat colonoscopy at some point , gi to decide on timing.  -s/p prbcx1units, s/p IV ironx1, hgb 8 at discharge. -hold plavix/asa for 5 days, then resume -he is cleared to d/c home by GI, he is to close follow up with GI.  H/o CVA/vascular dementia, memory impairment Hold asa/plavix for 5 days, then resume. Continue statin. bp meds he does  Not tolerate aricept 10mg  daily, he is continued on aricept 5mg  daily He is to continue b12 injection on outpatient basis He is prescribed with daily thiamine   CAD s/p stent in 2013 Hold asa/plavix for 5 days, Continue statin, imdur  history of bradycardia, not able to tolerate betablocker  HTN; continue home meds Norvasc, hydralazine, imdur  CKDIII Renal at baseline, renal dosing meds Discontinue home meds fenoprofen  Prostate cancer: Most recent nuclear medicine study shows no evidence of metastatic bone disease. 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 If you have any questions about your discharge medications or the care you received while you were in the hospital after you are discharged, you can call the unit and asked to speak with the hospitalist on call if the hospitalist that took care of you is not available. Once you are discharged, your primary care physician will handle any further medical issues. Please note that NO REFILLS for any discharge medications will be authorized once you are discharged, as it is imperative that you return to your primary care physician (or establish a relationship with a primary care physician if you do not have one) for your aftercare needs so that they can reassess your need for medications and monitor your lab values.  Discharge Instructions    Diet - low sodium heart healthy   Complete by:  As directed    Increase activity slowly   Complete by:  As directed      Allergies as of 11/28/2017      Reactions   Amitriptyline Hcl Other (See Comments)   REACTION: confusion   Diphenhydramine Hcl Other (See Comments)   Keeps him awake   Simvastatin Other (See Comments)   REACTION: leg pain   Clonidine Derivatives    Dizziness, falls, bradycardia   Tylenol [acetaminophen] Other (See Comments)   Dr advised pt not to take due to finding a spot on pt's kidney      Medication List    STOP taking these medications   buPROPion 150 MG 24 hr tablet Commonly known as:  WELLBUTRIN XL   citalopram 20 MG tablet Commonly known as:  CELEXA   fenoprofen 600 MG Tabs tablet Commonly known as:  NALFON     TAKE these medications     abiraterone acetate 500 MG tablet Commonly known as:  ZYTIGA Take 1,000 mg by mouth daily. Take on an empty stomach 1 hour before or 2 hours after a meal.   amLODipine 10 MG tablet Commonly known as:  NORVASC TAKE 1 TABLET BY MOUTH  DAILY What changed:    how much to take  how to take this  when to take this   aspirin 81 MG chewable tablet Chew 1 tablet (81 mg total) by mouth daily. Start from 4/22 Start taking on:  12/03/2017 What changed:    additional instructions  These instructions start on 12/03/2017. If you are unsure what to do until then, ask your doctor or other care provider.   Chlorhexidine Gluconate Cloth 2 % Pads Apply 6 each topically daily at 6 (six) AM for 4 days.   clopidogrel 75 MG tablet Commonly known as:  PLAVIX Take 1 tablet (75 mg total) by mouth daily. Start on 4/22 Start taking on:  12/03/2017 What changed:    additional instructions  These instructions start  on 12/03/2017. If you are unsure what to do until then, ask your doctor or other care provider.   donepezil 5 MG tablet Commonly known as:  ARICEPT Take 1 tablet (5 mg total) by mouth at bedtime. What changed:    medication strength  how much to take   gabapentin 300 MG capsule Commonly known as:  NEURONTIN Take 1 capsule (300 mg total) by mouth 2 (two) times daily. What changed:    when to take this  Another medication with the same name was removed. Continue taking this medication, and follow the directions you see here.   hydrALAZINE 25 MG tablet Commonly known as:  APRESOLINE TAKE 1 TABLET (25 MG TOTAL) BY MOUTH EVERY 8 (EIGHT) HOURS.   isosorbide mononitrate 30 MG 24 hr tablet Commonly known as:  IMDUR TAKE 1 TABLET BY MOUTH  DAILY What changed:    how much to take  how to take this  when to take this   lovastatin 40 MG tablet Commonly known as:  MEVACOR Take 40 mg at bedtime by mouth.   meclizine 25 MG tablet Commonly known as:  ANTIVERT 12.5-25 mg as  needed for dizziness. TAKE 1/2 - 1 TABLET BY MOUTH THREE TIMES A DAY AS NEEDED FOR DIZINESS   nitroGLYCERIN 0.4 MG SL tablet Commonly known as:  NITROSTAT Place 1 tablet (0.4 mg total) under the tongue every 5 (five) minutes as needed for chest pain.   pantoprazole 40 MG tablet Commonly known as:  PROTONIX TAKE 1 TABLET BY MOUTH  DAILY What changed:    how much to take  how to take this  when to take this   pramipexole 0.5 MG tablet Commonly known as:  MIRAPEX Take 0.5 mg by mouth 3 (three) times daily.   predniSONE 5 MG tablet Commonly known as:  DELTASONE Take 5 mg daily with breakfast by mouth.   thiamine 50 MG tablet Commonly known as:  VITAMIN B-1 Take 1 tablet (50 mg total) by mouth daily.   vitamin C 100 MG tablet Take 250 mg by mouth daily.      Allergies  Allergen Reactions  . Amitriptyline Hcl Other (See Comments)    REACTION: confusion  . Diphenhydramine Hcl Other (See Comments)    Keeps him awake  . Simvastatin Other (See Comments)    REACTION: leg pain  . Clonidine Derivatives     Dizziness, falls, bradycardia  . Tylenol [Acetaminophen] Other (See Comments)    Dr advised pt not to take due to finding a spot on pt's kidney   Follow-up Information    Willia Craze, NP Follow up on 12/24/2017.   Specialty:  Gastroenterology Why:  at 9:00am Contact information: [place] [address] [phone]        Biagio Borg, MD Follow up in 1 week(s).   Specialties:  Internal Medicine, Radiology Why:  hospital discharge follow up. Contact information: [place] [address] [phone]        Burnell Blanks, MD .   Specialty:  Cardiology Contact information: [place] [address] [place] [address] [phone]        [facility] Follow up in 2 week(s).   Specialty:  Gastroenterology Why:  repeat labs Contact information: [address] [place] [address] [phone]           The results of significant diagnostics from this hospitalization (including imaging, microbiology, ancillary and  laboratory) are listed below for reference.    Significant Diagnostic Studies: Dg Chest 2 View  Result Date: 11/26/2017 CLINICAL DATA:  77 y/o  M; dyspnea. EXAM: CHEST - 2 VIEW COMPARISON:  11/09/2016 chest radiograph. FINDINGS: Stable mildly enlarged cardiac silhouette given differences in technique. Aortic atherosclerosis with calcification. Clear lungs. No pleural effusion or pneumothorax. No acute osseous abnormality is evident. Osteoarthrosis of the shoulder joints. IMPRESSION: No acute pulmonary process identified. Electronically Signed   By: Kristine Garbe M.D.   On: 11/26/2017 17:54    Microbiology: Recent Results (from the past 240 hour(s))  MRSA PCR Screening     Status: Abnormal   Collection Time: 11/27/17  6:53 PM  Result Value Ref Range Status   MRSA by PCR POSITIVE (A) NEGATIVE Final    Comment:        The GeneXpert MRSA Assay (FDA approved for NASAL specimens only), is one component of a comprehensive MRSA colonization surveillance program. It is not intended to diagnose MRSA infection nor to guide or monitor treatment for MRSA infections. RESULT CALLED TO, READ BACK BY AND VERIFIED WITH: CORE,Q. RN @2015  ON 04.16.19 BY COHEN,K Performed at [facility], [place] [address], [place], [place] [address]      Labs: Basic Metabolic Panel: Recent Labs  Lab 11/26/17 1200 11/26/17 1506 11/27/17 0536 11/28/17 0553 11/28/17 0939  NA 138 137 138 143  --   K 3.9 3.6 4.2 5.2* 4.5  CL 107 106 109 113*  --   CO2 22 22 22 22   --   GLUCOSE 86 114* 103* 117*  --   BUN 34* 35* 27* 20  --   CREATININE 1.68* 1.72* 1.62* 1.70*  --   CALCIUM 9.1 9.0 8.7* 8.8*  --    Liver Function Tests: Recent Labs  Lab 11/26/17 1200 11/26/17 1506 11/27/17 0536  AST 11 17 12*  ALT 6 10* 11*   ALKPHOS 73 78 62  BILITOT 0.9 1.1 1.4*  PROT 6.5 6.8 5.6*  ALBUMIN 4.0 3.7 3.1*   No results for input(s): LIPASE, AMYLASE in the last 168 hours. No results for input(s): AMMONIA in the last 168 hours. CBC: Recent Labs  Lab 11/26/17 1200 11/26/17 1506 11/26/17 2323 11/27/17 0536 11/28/17 0553  WBC 6.7 5.4  --  4.5 4.5  NEUTROABS 3.9  --   --   --   --   HGB 6.7 Repeated and verified X2.* 6.6* 7.1* 7.5* 8.0*  HCT 21.3 Repeated and verified X2.* 22.9* 23.5* 24.6* 26.1*  MCV 75.0* 80.4  --  81.2 81.8  PLT 168.0 154  --  133* 122*   Cardiac Enzymes: No results for input(s): CKTOTAL, CKMB, CKMBINDEX, TROPONINI in the last 168 hours. BNP: BNP (last 3 results) No results for input(s): BNP in the last 8760 hours.  ProBNP (last 3 results) No results for input(s): PROBNP in the last 8760 hours.  CBG: No results for input(s): GLUCAP in the last 168 hours.     Signed:  Florencia Reasons MD, PhD  [facility] 11/28/2017, 3:39 PM

## 2017-11-28 NOTE — Transfer of Care (Signed)
Immediate Anesthesia Transfer of Care Note  Patient: Victor Davis  Procedure(s) Performed: COLONOSCOPY (N/A ) ESOPHAGOGASTRODUODENOSCOPY (EGD) (N/A )  Patient Location: PACU  Anesthesia Type:MAC  Level of Consciousness: awake, alert  and oriented  Airway & Oxygen Therapy: Patient Spontanous Breathing and Patient connected to nasal cannula oxygen  Post-op Assessment: Report given to RN and Post -op Vital signs reviewed and stable  Post vital signs: Reviewed and stable  Last Vitals:  Vitals Value Taken Time  BP    Temp    Pulse 48 11/28/2017 12:20 PM  Resp 10 11/28/2017 12:20 PM  SpO2 100 % 11/28/2017 12:20 PM  Vitals shown include unvalidated device data.  Last Pain:  Vitals:   11/28/17 1100  TempSrc: Oral  PainSc: 0-No pain         Complications: No apparent anesthesia complications

## 2017-11-29 ENCOUNTER — Telehealth: Payer: Self-pay

## 2017-11-29 NOTE — Telephone Encounter (Signed)
Transition Care Management Follow-up Telephone Call   Date discharged? 11/28/2017   How have you been since you were released from the hospital? Pt spouse stated that pt is doing well.    Do you understand why you were in the hospital? Pt spouse understands why pt was admitted.    Do you understand the discharge instructions? Pt spouse understands the dc instructions and medication changes.    Where were you discharged to? Home  Items Reviewed:  Medications reviewed: Yes  Allergies reviewed: Yes  Dietary changes reviewed: Yes  Referrals reviewed: Yes  Functional Questionnaire:   Activities of Daily Living (ADLs):   States they are independent in the following: Pt is somewhat independent States they require assistance with the following: memory deficit   Any transportation issues/concerns?: No   Confirmed importance and date/time of follow-up visits scheduled: Yes  Provider Appointment booked with Dr. Jenny Reichmann on [facility] April 22nd, 2019 at 10:00am  Confirmed with patient if condition begins to worsen call PCP or go to the ER.  Patient was given the office number and encouraged to call back with question or concerns.  : Yes

## 2017-12-03 ENCOUNTER — Ambulatory Visit (INDEPENDENT_AMBULATORY_CARE_PROVIDER_SITE_OTHER): Payer: Medicare Other | Admitting: Internal Medicine

## 2017-12-03 ENCOUNTER — Encounter: Payer: Self-pay | Admitting: Internal Medicine

## 2017-12-03 ENCOUNTER — Telehealth: Payer: Self-pay

## 2017-12-03 ENCOUNTER — Other Ambulatory Visit (INDEPENDENT_AMBULATORY_CARE_PROVIDER_SITE_OTHER): Payer: Medicare Other

## 2017-12-03 VITALS — BP 120/86 | HR 72 | Temp 97.8°F | Ht 69.0 in | Wt 143.0 lb

## 2017-12-03 DIAGNOSIS — D649 Anemia, unspecified: Secondary | ICD-10-CM | POA: Diagnosis not present

## 2017-12-03 DIAGNOSIS — I10 Essential (primary) hypertension: Secondary | ICD-10-CM

## 2017-12-03 DIAGNOSIS — R7302 Impaired glucose tolerance (oral): Secondary | ICD-10-CM | POA: Diagnosis not present

## 2017-12-03 DIAGNOSIS — F0390 Unspecified dementia without behavioral disturbance: Secondary | ICD-10-CM | POA: Diagnosis not present

## 2017-12-03 LAB — BASIC METABOLIC PANEL
BUN: 25 mg/dL — ABNORMAL HIGH (ref 6–23)
CALCIUM: 9.2 mg/dL (ref 8.4–10.5)
CHLORIDE: 107 meq/L (ref 96–112)
CO2: 23 meq/L (ref 19–32)
Creatinine, Ser: 1.68 mg/dL — ABNORMAL HIGH (ref 0.40–1.50)
GFR: 42.18 mL/min — ABNORMAL LOW (ref >60.00)
GLUCOSE: 133 mg/dL — AB (ref 70–99)
Potassium: 4.6 mEq/L (ref 3.5–5.1)
SODIUM: 138 meq/L (ref 135–145)

## 2017-12-03 LAB — CBC WITH DIFFERENTIAL/PLATELET
BASOS ABS: 0 10*3/uL (ref 0.0–0.1)
BASOS PCT: 0.4 % (ref 0.0–3.0)
Eosinophils Absolute: 0 10*3/uL (ref 0.0–0.7)
Eosinophils Relative: 0.6 % (ref 0.0–5.0)
HEMATOCRIT: 25.9 % — AB (ref 39.0–52.0)
Hemoglobin: 8.4 g/dL — ABNORMAL LOW (ref 13.0–17.0)
LYMPHS ABS: 1 10*3/uL (ref 0.7–4.0)
Lymphocytes Relative: 18.6 % (ref 12.0–46.0)
MCHC: 32.4 g/dL (ref 30.0–36.0)
MCV: 79 fl (ref 78.0–100.0)
MONOS PCT: 8.6 % (ref 3.0–12.0)
Monocytes Absolute: 0.5 10*3/uL (ref 0.1–1.0)
NEUTROS ABS: 4.1 10*3/uL (ref 1.4–7.7)
Neutrophils Relative %: 71.8 % (ref 43.0–77.0)
PLATELETS: 146 10*3/uL — AB (ref 150.0–400.0)
RBC: 3.28 Mil/uL — ABNORMAL LOW (ref 4.22–5.81)
RDW: 18.2 % — AB (ref 11.5–15.5)
WBC: 5.6 10*3/uL (ref 4.0–10.5)

## 2017-12-03 NOTE — Assessment & Plan Note (Signed)
Stable by hx, for f/u lab today, and f/u GI as planned

## 2017-12-03 NOTE — Progress Notes (Signed)
Subjective:    Patient ID: Victor Davis, male    DOB: [birth year], 77 y.o.   MRN: [id number]  HPI 77 y.o.malewith medical history significantof lung cancer and prostate cancer, coronary disease status post stent x4, chronic kidney disease, stroke, hypertension and several other medical problems presenting after being told to present by his primary care physician for abnormal labs with symptomatic anemia, with hgb 6.6. Had some dizziness and recurrent falls.  -hgb 6.6 on presentation, over 8 at d/c -possibly from chronic GI Blood loss , he also has anemia of chronic disease, retic count is inappropriately low -he presented to the hospital due to weakness, worsening of depression -diagnostic EGD/colonoscopy no acute bleeding, does has polyps, will need repeat colonoscopy at some point , gi to decide on timing.  -s/p prbcx1units, s/p IV ironx1, hgb 8 at discharge. 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 Cancer ([place])    lung and prostate cancer per wife  . 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 CKD (chronic kidney disease), stage III ([place])   . Coronary artery disease    a. DES to LAD and Cx 02/2012.  [name] Dementia   . Dysuria 10/10/2010  . Emphysema 04/01/2012   By CT chest, august 2013  . ERECTILE DYSFUNCTION 04/05/2007  . GERD (gastroesophageal reflux disease)   . HYPERLIPIDEMIA 07/30/2008  . HYPERSOMNIA 10/02/2007  . Impaired glucose tolerance 02/09/2011  . INSOMNIA-SLEEP DISORDER-UNSPEC 10/22/2009  . LOW BACK PAIN 04/05/2007  . Mild carotid artery disease ([place])    a. 1-39% bilaterally by duplex 11/2016.  . OSTEOARTHRITIS 04/05/2007  . PEPTIC ULCER DISEASE 04/05/2007  . PERIPHERAL EDEMA 10/22/2009  . PROSTATE CANCER, HX  OF 04/05/2007  . RESTLESS LEG SYNDROME 05/15/2007  . RHINITIS, ALLERGIC NOS 05/15/2007  . Sinus bradycardia 10/24/2010   a. prior h/o HR 40s, clonidine stopped 05/2017 due to this.  [name] Unspecified visual loss 07/15/2008  . UTI 10/24/2010   Past Surgical History:  Procedure Laterality Date  . CARDIAC CATHETERIZATION    . COLONOSCOPY N/A 11/28/2017   Procedure: COLONOSCOPY;  Surgeon: Yetta Flock, MD;  Location: [facility]COPY;  Service: Gastroenterology;  Laterality: N/A;  . ESOPHAGOGASTRODUODENOSCOPY N/A 11/28/2017   Procedure: ESOPHAGOGASTRODUODENOSCOPY (EGD);  Surgeon: Yetta Flock, MD;  Location: [facility];  Service: Gastroenterology;  Laterality: N/A;  . EYE SURGERY     right  . Inguinal herniorrhapy left  2002   x 2  . KNEE SURGERY     left  . Left hip replacement    . LYMPH NODE DISSECTION Right 03/22/2015   Procedure: LYMPH NODE DISSECTION;  Surgeon: Melrose Nakayama, MD;  Location: [facility];  Service: Thoracic;  Laterality: Right;  . PERCUTANEOUS CORONARY STENT INTERVENTION (PCI-S) N/A 03/08/2012   Procedure: PERCUTANEOUS CORONARY STENT INTERVENTION (PCI-S);  Surgeon: Burnell Blanks, MD;  Location: [facility] C[facility] LAB;  Service: Cardiovascular;  Laterality: N/A;  . PROSTATECTOMY  2002   radical  . Right hip replacement  09/22/09  . ROTATOR CUFF REPAIR     left  . s/p ventral surgery  2009  . SEGMENTECOMY Right 03/22/2015   Procedure: RIGHT LOWER LOBE SUPERIOR SEGMENTECTOMY;  Surgeon: Melrose Nakayama, MD;  Location: [facility] OR;  Service: Thoracic;  Laterality: Right;  [name] VIDEO ASSISTED THORACOSCOPY Right 03/22/2015   Procedure: RIGHT VIDEO ASSISTED THORACOSCOPY;  Surgeon: Melrose Nakayama, MD;  Location: [facility];  Service: Thoracic;  Laterality: Right;    reports that he quit smoking about 40 years ago. His smoking use included cigarettes. He has a 15.00 pack-year smoking history. He has never used smokeless tobacco. He reports that he does not drink alcohol or use  drugs. family history includes Heart attack (age of onset: 45) in his father; Heart attack (age of onset: 73) in his brother; Lung cancer in his sister. Allergies  Allergen Reactions  . Amitriptyline Hcl Other (See Comments)    REACTION: confusion  . Diphenhydramine Hcl Other (See Comments)    Keeps him awake  . Simvastatin Other (See Comments)    REACTION: leg pain  . Clonidine Derivatives     Dizziness, falls, bradycardia  . Tylenol [Acetaminophen] Other (See Comments)    Dr advised pt not to take due to finding a spot on pt's kidney   Current Outpatient Medications on File Prior to Visit  Medication Sig Dispense Refill  . abiraterone acetate (ZYTIGA) 500 MG tablet Take 1,000 mg by mouth daily. Take on an empty stomach 1 hour before or 2 hours after a meal.    . amLODipine (NORVASC) 10 MG tablet TAKE 1 TABLET BY MOUTH  DAILY (Patient taking differently: Take 10mg  by mouth daily) 90 tablet 2  . Ascorbic Acid (VITAMIN C) 100 MG tablet Take 500 mg by mouth daily.     [name] aspirin 81 MG chewable tablet Chew 1 tablet (81 mg total) by mouth daily. Start from 4/22 30 tablet 0  . clopidogrel (PLAVIX) 75 MG tablet Take 1 tablet (75 mg total) by mouth daily. Start on 4/22 30 tablet 0  . donepezil (ARICEPT) 5 MG tablet Take 1 tablet (5 mg total) by mouth at bedtime. 30 tablet 0  . gabapentin (NEURONTIN) 300 MG capsule Take 1 capsule (300 mg total) by mouth 2 (two) times daily. 60 capsule 0  . hydrALAZINE (APRESOLINE) 25 MG tablet TAKE 1 TABLET (25 MG TOTAL) BY MOUTH EVERY 8 (EIGHT) HOURS.  0  . isosorbide mononitrate (IMDUR) 30 MG 24 hr tablet TAKE 1 TABLET BY MOUTH  DAILY (Patient taking differently: Take 30mg  by mouth daily) 90 tablet 1  . lovastatin (MEVACOR) 40 MG tablet Take 40 mg at bedtime by mouth.     . meclizine (ANTIVERT) 25 MG tablet 12.5-25 mg as needed for dizziness. TAKE 1/2 - 1 TABLET BY MOUTH THREE TIMES A DAY AS NEEDED FOR DIZINESS     . nitroGLYCERIN (NITROSTAT) 0.4 MG SL tablet  Place 1 tablet (0.4 mg total) under the tongue every 5 (five) minutes as needed for chest pain. 25 tablet 3  . pantoprazole (PROTONIX) 40 MG tablet TAKE 1 TABLET BY MOUTH  DAILY (Patient taking differently: Take 40mg  by mouth daily) 90 tablet 2  . pramipexole (MIRAPEX) 0.5 MG tablet Take 0.5 mg by mouth 3 (three) times daily.     . predniSONE (DELTASONE) 5 MG tablet Take 5 mg daily with breakfast by mouth.    . thiamine (VITAMIN B-1) 50 MG tablet Take 1 tablet (50 mg total) by mouth daily. 30 tablet 0   No current facility-administered medications on file prior to visit.    Review of Systems  Constitutional: Negative for other unusual diaphoresis or sweats HENT: Negative for ear discharge or swelling Eyes: Negative  for other worsening visual disturbances Respiratory: Negative for stridor or other swelling  Gastrointestinal: Negative for worsening distension or other blood Genitourinary: Negative for retention or other urinary change Musculoskeletal: Negative for other MSK pain or swelling Skin: Negative for color change or other new lesions Neurological: Negative for worsening tremors and other numbness  Psychiatric/Behavioral: Negative for worsening agitation or other fatigue All other system neg pre pt    Objective:   Physical Exam BP 120/86   Pulse 72   Temp 97.8 F (36.6 C) (Oral)   Ht 5\' 9"  (1.753 m)   Wt 143 lb (64.9 kg)   SpO2 97%   BMI 21.12 kg/m  VS noted,  Constitutional: Pt appears in NAD HENT: Head: NCAT.  Right Ear: External ear normal.  Left Ear: External ear normal.  Eyes: . Pupils are equal, round, and reactive to light. Conjunctivae and EOM are normal Nose: without d/c or deformity Neck: Neck supple. Gross normal ROM Cardiovascular: Normal rate and regular rhythm.   Pulmonary/Chest: Effort normal and breath sounds without rales or wheezing.  Abd:  Soft, NT, ND, + BS, no organomegaly Neurological: Pt is alert. At baseline orientation, motor grossly  intact Skin: Skin is warm. No rashes, other new lesions, no LE edema Psychiatric: Pt behavior is normal without agitation  No other exam findings Lab Results  Component Value Date   WBC 4.5 11/28/2017   HGB 8.0 (L) 11/28/2017   HCT 26.1 (L) 11/28/2017   PLT 122 (L) 11/28/2017   GLUCOSE 117 (H) 11/28/2017   CHOL 90 11/26/2017   TRIG 55.0 11/26/2017   HDL 41.60 11/26/2017   LDLDIRECT 104.9 02/10/2011   LDLCALC 37 11/26/2017   ALT 11 (L) 11/27/2017   AST 12 (L) 11/27/2017   NA 143 11/28/2017   K 4.5 11/28/2017   CL 113 (H) 11/28/2017   CREATININE 1.70 (H) 11/28/2017   BUN 20 11/28/2017   CO2 22 11/28/2017   TSH 2.36 11/26/2017   PSA 27.49 (H) 04/11/2017   INR 0.99 11/27/2017   HGBA1C 5.5 11/26/2017   MICROALBUR 3.6 (H) 02/01/2010          Assessment & Plan:

## 2017-12-03 NOTE — Assessment & Plan Note (Signed)
With recent aricept reduced due to bradycardia, ok to cont same tx

## 2017-12-03 NOTE — Assessment & Plan Note (Signed)
Lab Results  Component Value Date   HGBA1C 5.5 11/26/2017  stable overall by history and exam, recent data reviewed with pt, and pt to continue medical treatment as before,  to f/u any worsening symptoms or concerns

## 2017-12-03 NOTE — Telephone Encounter (Signed)
Called pt, LVM with details below  

## 2017-12-03 NOTE — Assessment & Plan Note (Signed)
stable overall by history and exam, recent data reviewed with pt, and pt to continue medical treatment as before,  to f/u any worsening symptoms or concerns BP Readings from Last 3 Encounters:  12/03/17 120/86  11/28/17 (!) 178/70  11/26/17 (!) 142/78

## 2017-12-03 NOTE — Patient Instructions (Signed)

## 2017-12-03 NOTE — Telephone Encounter (Signed)
-----   Message from Biagio Borg, MD sent at 12/03/2017  1:57 PM EDT ----- Madaline Brilliant for Cari Burgo to let wife know - labs no change, everything good and stable

## 2017-12-12 ENCOUNTER — Other Ambulatory Visit: Payer: Self-pay | Admitting: Internal Medicine

## 2017-12-18 ENCOUNTER — Telehealth: Payer: Self-pay | Admitting: Cardiovascular Disease

## 2017-12-18 NOTE — Telephone Encounter (Signed)
Pt's wife is calling to schedule an appt following a hospitalization. Pt will need an appt with Dr Angelena Form or one of his APPs.

## 2017-12-18 NOTE — Telephone Encounter (Signed)
New message  Pt wife verbalzied that she is calling for RN  She stated that pt was in the hospital in April and that he   Was given blood because it was very low  Pt wife want Dr.McAlhany to review and to decide if pt needs to be seen in office

## 2017-12-20 NOTE — Telephone Encounter (Signed)
I spoke with pt's wife. She reports pt was in hospital recently due to low blood counts.  Medications changed.  Was told to follow up with cardiology.  She reports pt is doing well.  Is seeing GI and primary care. I scheduled pt to see Ermalinda Barrios, PA on June 18,2019 at 9:30

## 2017-12-24 ENCOUNTER — Ambulatory Visit: Payer: Medicare Other | Admitting: Nurse Practitioner

## 2017-12-25 ENCOUNTER — Ambulatory Visit (INDEPENDENT_AMBULATORY_CARE_PROVIDER_SITE_OTHER): Payer: Medicare Other | Admitting: *Deleted

## 2017-12-25 DIAGNOSIS — E538 Deficiency of other specified B group vitamins: Secondary | ICD-10-CM

## 2017-12-25 MED ORDER — CYANOCOBALAMIN 1000 MCG/ML IJ SOLN
1000.0000 ug | Freq: Once | INTRAMUSCULAR | Status: AC
  Administered 2017-12-25: 1000 ug via INTRAMUSCULAR

## 2018-01-01 DIAGNOSIS — H2512 Age-related nuclear cataract, left eye: Secondary | ICD-10-CM | POA: Diagnosis not present

## 2018-01-01 DIAGNOSIS — H3500 Unspecified background retinopathy: Secondary | ICD-10-CM | POA: Diagnosis not present

## 2018-01-02 ENCOUNTER — Other Ambulatory Visit: Payer: Self-pay | Admitting: Internal Medicine

## 2018-01-02 MED ORDER — PANTOPRAZOLE SODIUM 40 MG PO TBEC: 40.0000 mg | DELAYED_RELEASE_TABLET | Freq: Every day | ORAL | 1 refills | Status: DC

## 2018-01-11 ENCOUNTER — Encounter: Payer: Self-pay | Admitting: Nurse Practitioner

## 2018-01-11 ENCOUNTER — Ambulatory Visit (INDEPENDENT_AMBULATORY_CARE_PROVIDER_SITE_OTHER): Payer: Medicare Other | Admitting: Nurse Practitioner

## 2018-01-11 ENCOUNTER — Other Ambulatory Visit (INDEPENDENT_AMBULATORY_CARE_PROVIDER_SITE_OTHER): Payer: Medicare Other

## 2018-01-11 ENCOUNTER — Telehealth: Payer: Self-pay

## 2018-01-11 VITALS — BP 114/60 | HR 64 | Ht 67.0 in | Wt 133.0 lb

## 2018-01-11 DIAGNOSIS — Z860100 Personal history of colon polyps, unspecified: Secondary | ICD-10-CM

## 2018-01-11 DIAGNOSIS — Z7901 Long term (current) use of anticoagulants: Secondary | ICD-10-CM | POA: Diagnosis not present

## 2018-01-11 LAB — CBC
HCT: 29.4 % — ABNORMAL LOW (ref 39.0–52.0)
Hemoglobin: 9.5 g/dL — ABNORMAL LOW (ref 13.0–17.0)
MCHC: 32.4 g/dL (ref 30.0–36.0)
MCV: 85.7 fl (ref 78.0–100.0)
Platelets: 152 10*3/uL (ref 150.0–400.0)
RBC: 3.43 Mil/uL — AB (ref 4.22–5.81)
RDW: 21.6 % — ABNORMAL HIGH (ref 11.5–15.5)
WBC: 7 10*3/uL (ref 4.0–10.5)

## 2018-01-11 MED ORDER — NA SULFATE-K SULFATE-MG SULF 17.5-3.13-1.6 GM/177ML PO SOLN: ORAL | 0 refills | Status: DC

## 2018-01-11 NOTE — Telephone Encounter (Signed)
[facility] Pre-operative Risk Assessment     Request for surgical clearance:     Endoscopy Procedure  What type of surgery is being performed?     Colonoscopy   When is this surgery scheduled?     01/22/18  What type of clearance is required ?   Pharmacy  Are there any medications that need to be held prior to surgery and how long? Plavix 5 days  Practice name and name of physician performing surgery?      [facility]/Dr. Hilarie Fredrickson  What is your office phone and fax number?      Phone- [phone]  [phone]  Anesthesia type (None, local, MAC, general) ?       MAC

## 2018-01-11 NOTE — Telephone Encounter (Signed)
   Primary Cardiologist: Lauree Chandler, MD  Chart reviewed as part of pre-operative protocol coverage. Patient was contacted 01/11/2018 in reference to pre-operative risk assessment for pending surgery as outlined below.  THESEUS BIRNIE was last seen on 10/01/2017 by Dr. Angelena Form.  Since that day, Victor Davis has done well.  He is not having chest pain or shortness of breath.  Therefore, based on ACC/AHA guidelines, the patient would be at acceptable risk for the planned procedure without further cardiovascular testing.   **Once Dr. Angelena Form states whether or not it is okay to hold the Plavix for 5 days, I will route this recommendation to the requesting party via [place] fax function and remove from pre-op pool.**  For now, I will route it only to Dr. Angelena Form.  Please call with questions.  Rosaria Ferries, PA-C 01/11/2018, 4:54 PM

## 2018-01-11 NOTE — Progress Notes (Addendum)
IMPRESSION and PLAN:    #1. 77 yo male recently admitted with heme positive stools and acute on chronic anemia on Plavix. Negative inpatient EGD. Three small polyps found on colonoscopy ( not removed). 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  -Recheck CBC today.  -Patient needs repeat colonoscopy ( off plavix) for removal of polyps. The risks and benefits of colonoscopy with possible polypectomy were discussed and the patient agrees to proceed.   #2 Chronic Anti-platelet use.  -Hold Plavix for 5 days before procedure - will instruct when and how to resume after procedure. Patient understands that there is a low but real risk of cardiovascular event such as heart attack, stroke, or embolism /  thrombosis, or ischemia while off Plavix. The patient consents to proceed. Will communicate by phone or EMR with patient's prescribing provider to confirm that holding Plavix is reasonable in this case.    Addendum: Reviewed and agree with management. Pyrtle, Victor Lines, MD        HPI:    Primary GI MD: Dr. Hilarie Fredrickson  Chief Complaint: Hospital follow-up   Patient is a 77 year old male with multiple medical problems not limited to CKD, CAD/PCI on chronic Plavix, dementia, history of prostate and bladder cancer.  We saw patient in hospital mid April for acute on chronic anemia.  He was iron deficient with Hemoccult positive stools.  EGD was unremarkable.  He had a complete colonoscopy with findings of diverticulosis, a 7 mm polyp in the ascending colon, 2 sessile polyps in the transverse ranging 3 to 4 mm in size.  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 Plavix held for 5 days, advised repeat CBC in 2 weeks at our office but it was not done.  Patient is here for hospital follow-up  Victor Davis has no abdominal pain, he has not seen any blood in his stool nor black stools.  Appetite is okay.   Review of systems:   No  chest pain, occasional shortness of breath when working out in the heat.  No fevers.  Past Medical History:  Diagnosis Date  . Cancer ([place])    lung and prostate cancer per wife  . 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 CKD (chronic kidney disease), stage III ([place])   . Coronary artery disease    a. DES to LAD and Cx 02/2012.  [name] Dementia   . Dysuria 10/10/2010  . Emphysema 04/01/2012   By CT chest, august 2013  . ERECTILE DYSFUNCTION 04/05/2007  . GERD (gastroesophageal reflux disease)   . HYPERLIPIDEMIA 07/30/2008  . HYPERSOMNIA 10/02/2007  . Impaired glucose tolerance 02/09/2011  . INSOMNIA-SLEEP DISORDER-UNSPEC 10/22/2009  . LOW BACK PAIN 04/05/2007  . Mild carotid artery disease ([place])    a. 1-39% bilaterally by duplex 11/2016.  . OSTEOARTHRITIS 04/05/2007  . PEPTIC ULCER DISEASE 04/05/2007  . PERIPHERAL EDEMA 10/22/2009  . PROSTATE CANCER, HX OF 04/05/2007  . RESTLESS LEG SYNDROME 05/15/2007  . RHINITIS, ALLERGIC NOS 05/15/2007  . Sinus bradycardia 10/24/2010   a. prior h/o HR 40s, clonidine stopped 05/2017 due to this.  [name] Unspecified visual loss 07/15/2008  . UTI 10/24/2010    Patient's surgical history, family medical history, social history, medications and allergies were all reviewed in Epic    Physical Exam:     Ht 5\' 7"  (1.702 m)   Wt 133 lb (60.3 kg)   BMI 20.83 kg/m    GENERAL:  Pleasant male in  NAD PSYCH: : Cooperative, normal affect EENT:  conjunctiva pink, mucous membranes moist, neck supple without masses CARDIAC:  RRR, no murmur heard, no peripheral edema PULM: Normal respiratory effort, lungs CTA bilaterally, no wheezing ABDOMEN:  Nondistended, soft, nontender. No obvious masses, no hepatomegaly,  normal bowel sounds SKIN:  turgor, no lesions seen Musculoskeletal:  Normal muscle tone, normal strength NEURO: Alert and oriented x 3, no focal neurologic deficits   Victor Davis , NP 01/11/2018, 11:07 AM

## 2018-01-11 NOTE — Patient Instructions (Addendum)
If you are age 78 or older, your body mass index should be between 23-30. Your Body mass index is 20.83 kg/m. If this is out of the aforementioned range listed, please consider follow up with your Primary Care Provider.  If you are age 62 or younger, your body mass index should be between 19-25. Your Body mass index is 20.83 kg/m. If this is out of the aformentioned range listed, please consider follow up with your Primary Care Provider.   You have been scheduled for a colonoscopy. Please follow written instructions given to you at your visit today.  Please pick up your prep supplies at the pharmacy within the next 1-3 days. If you use inhalers (even only as needed), please bring them with you on the day of your procedure. Your physician has requested that you go to www.startemmi.com and enter the access code given to you at your visit today. This web site gives a general overview about your procedure. However, you should still follow specific instructions given to you by our office regarding your preparation for the procedure.  We have sent the following medications to your pharmacy for you to pick up at your convenience: [place] provider has requested that you go to the basement level for lab work before leaving today. Press "B" on the elevator. The lab is located at the first door on the left as you exit the elevator. CBC  You will be contacted by our office prior to your procedure for directions on holding your Plavix.  If you do not hear from our office 1 week prior to your scheduled procedure, please call [phone] to discuss.   Thank you for choosing me and [facility].   Tye Savoy, NP

## 2018-01-13 ENCOUNTER — Encounter: Payer: Self-pay | Admitting: Nurse Practitioner

## 2018-01-15 NOTE — Telephone Encounter (Signed)
   Primary Cardiologist:Christopher Angelena Form, MD  Chart reviewed as part of pre-operative protocol coverage. Pre-op clearance already addressed by colleagues in earlier phone notes. To summarize recommendations:  - Rosaria Ferries spoke with patient and states he would be at acceptable risk for the planned procedure without further cardiovascular testing.   - Last PCI was in 2013. It is suspected he is also on DAPT in part due to history of CVA. In 2014 Dr. Angelena Form did clear her to come off Plavix in preparation for a procedure so this can be extrapolated to current situation as he has not had any interim PCIs. Dr. Angelena Form is out of the office so I discussed with Dr. Marlou Porch who agrees he is OK to come off Plavix for 5 days.  Will route this bundled recommendation to requesting provider via Epic fax function. Please call with questions.  Pre-op staff, since pt's wife called in requesting update, please call and make patient aware as well.  Charlie Pitter, PA-C 01/15/2018, 1:56 PM

## 2018-01-15 NOTE — Telephone Encounter (Signed)
Called patient's wife, Victor Davis, and reviewed the recommendation to hold Plavix for 5 days prior to colonoscopy. I advised her to have patient take Plavix tomorrow, June 5 and then hold until after colonoscopy. I advised her that the patient will need to ask Dr. Hilarie Fredrickson when he can resume the Plavix. Victor Davis verbalized understanding and agreement and thanked me for the call.

## 2018-01-15 NOTE — Telephone Encounter (Signed)
Patient wife is wanting to know when patient needs to stop taking BT. Best # [phone]

## 2018-01-15 NOTE — Telephone Encounter (Signed)
I spoke with patients wife to confirm receipt of message from [place].  Patients wife verbalized understanding.

## 2018-01-16 ENCOUNTER — Encounter: Payer: Self-pay | Admitting: Internal Medicine

## 2018-01-22 ENCOUNTER — Other Ambulatory Visit: Payer: Self-pay

## 2018-01-22 ENCOUNTER — Ambulatory Visit (AMBULATORY_SURGERY_CENTER): Payer: Medicare Other | Admitting: Internal Medicine

## 2018-01-22 ENCOUNTER — Encounter: Payer: Self-pay | Admitting: Internal Medicine

## 2018-01-22 VITALS — BP 121/68 | HR 50 | Temp 98.0°F | Resp 10 | Ht 67.0 in | Wt 133.0 lb

## 2018-01-22 DIAGNOSIS — D124 Benign neoplasm of descending colon: Secondary | ICD-10-CM

## 2018-01-22 DIAGNOSIS — D122 Benign neoplasm of ascending colon: Secondary | ICD-10-CM | POA: Diagnosis not present

## 2018-01-22 DIAGNOSIS — D123 Benign neoplasm of transverse colon: Secondary | ICD-10-CM

## 2018-01-22 DIAGNOSIS — D120 Benign neoplasm of cecum: Secondary | ICD-10-CM | POA: Diagnosis not present

## 2018-01-22 DIAGNOSIS — Z86010 Personal history of colonic polyps: Secondary | ICD-10-CM | POA: Diagnosis present

## 2018-01-22 MED ORDER — SODIUM CHLORIDE 0.9 % IV SOLN: 500.0000 mL | Freq: Once | INTRAVENOUS | Status: DC

## 2018-01-22 NOTE — Patient Instructions (Addendum)
HANDOUTS GIVEN : POLYPS AND DIVERTICULOSIS.  RESTART YOUR PLAVIX TOMORROW.   YOU HAD AN ENDOSCOPIC PROCEDURE TODAY AT [facility]:   Refer to the procedure report that was given to you for any specific questions about what was found during the examination.  If the procedure report does not answer your questions, please call your gastroenterologist to clarify.  If you requested that your care partner not be given the details of your procedure findings, then the procedure report has been included in a sealed envelope for you to review at your convenience later.  YOU SHOULD EXPECT: Some feelings of bloating in the abdomen. Passage of more gas than usual.  Walking can help get rid of the air that was put into your GI tract during the procedure and reduce the bloating. If you had a lower endoscopy (such as a colonoscopy or flexible sigmoidoscopy) you may notice spotting of blood in your stool or on the toilet paper. If you underwent a bowel prep for your procedure, you may not have a normal bowel movement for a few days.  Please Note:  You might notice some irritation and congestion in your nose or some drainage.  This is from the oxygen used during your procedure.  There is no need for concern and it should clear up in a day or so.  SYMPTOMS TO REPORT IMMEDIATELY:   Following lower endoscopy (colonoscopy or flexible sigmoidoscopy):  Excessive amounts of blood in the stool  Significant tenderness or worsening of abdominal pains  Swelling of the abdomen that is new, acute  Fever of 100F or higher   For urgent or emergent issues, a gastroenterologist can be reached at any hour by calling [phone].   DIET:  We do recommend a small meal at first, but then you may proceed to your regular diet.  Drink plenty of fluids but you should avoid alcoholic beverages for 24 hours.  ACTIVITY:  You should plan to take it easy for the rest of today and you should NOT DRIVE or use heavy  machinery until tomorrow (because of the sedation medicines used during the test).    FOLLOW UP: Our staff will call the number listed on your records the next business day following your procedure to check on you and address any questions or concerns that you may have regarding the information given to you following your procedure. If we do not reach you, we will leave a message.  However, if you are feeling well and you are not experiencing any problems, there is no need to return our call.  We will assume that you have returned to your regular daily activities without incident.  If any biopsies were taken you will be contacted by phone or by letter within the next 1-3 weeks.  Please call us at [phone] if you have not heard about the biopsies in 3 weeks.    SIGNATURES/CONFIDENTIALITY: You and/or your care partner have signed paperwork which will be entered into your electronic medical record.  These signatures attest to the fact that that the information above on your After Visit Summary has been reviewed and is understood.  Full responsibility of the confidentiality of this discharge information lies with you and/or your care-partner.

## 2018-01-22 NOTE — Progress Notes (Signed)
Called to room to assist during endoscopic procedure.  Patient ID and intended procedure confirmed with present staff. Received instructions for my participation in the procedure from the performing physician.  

## 2018-01-22 NOTE — Progress Notes (Signed)
Spontaneous respirations throughout. VSS. Resting comfortably. To PACU on room air. Report to  RN. 

## 2018-01-22 NOTE — Progress Notes (Signed)
Patient's wife at bedside, answering questions. Pt has dementia. Wife states patient has not had Plavix in five days.

## 2018-01-22 NOTE — Progress Notes (Signed)
Pt's states no medical or surgical changes since previsit or office visit. 

## 2018-01-22 NOTE — Op Note (Signed)
[facility] Patient Name: Victor Davis Procedure Date: 01/22/2018 9:16 AM MRN: [id number] Endoscopist: Jerene Bears , MD Age: 77 Referring MD:  Date of Birth: September 22, 1940 Gender: Male Account #: [id number] Procedure:                Colonoscopy Indications:              High risk colon cancer surveillance: Personal                            history of colonic polyps at colonoscopy in 2014;                            recent Heme + stools and acute on chronic anemia;                            inpatient colonoscopy with polyps not removed due                            to anti-platelet therapy, ablation of rectal                            angioectasia Medicines:                Monitored Anesthesia Care Procedure:                Pre-Anesthesia Assessment:                           - Prior to the procedure, a History and Physical                            was performed, and patient medications and                            allergies were reviewed. The patient's tolerance of                            previous anesthesia was also reviewed. The risks                            and benefits of the procedure and the sedation                            options and risks were discussed with the patient.                            All questions were answered, and informed consent                            was obtained. Prior Anticoagulants: The patient has                            taken Plavix (clopidogrel), last dose was 5 days  prior to procedure. ASA Grade Assessment: III - A                            patient with severe systemic disease. After                            reviewing the risks and benefits, the patient was                            deemed in satisfactory condition to undergo the                            procedure.                           After obtaining informed consent, the colonoscope                            was passed under  direct vision. Throughout the                            procedure, the patient's blood pressure, pulse, and                            oxygen saturations were monitored continuously. The                            Model PCF-[id number]) scope was introduced                            through the anus and advanced to the cecum,                            identified by appendiceal orifice and ileocecal                            valve. The colonoscopy was performed without                            difficulty. The patient tolerated the procedure                            well. The quality of the bowel preparation was                            adequate. The ileocecal valve, appendiceal orifice,                            and rectum were photographed. Scope In: 9:28:49 AM Scope Out: 9:49:29 AM Scope Withdrawal Time: 0 hours 15 minutes 35 seconds  Total Procedure Duration: 0 hours 20 minutes 40 seconds  Findings:                 Two sessile polyps were found in the cecum. The  polyps were 3 to 5 mm in size. These polyps were                            removed with a cold snare. Resection and retrieval                            were complete.                           A 8 mm polyp was found in the ascending colon. The                            polyp was sessile. The polyp was removed with a                            cold snare. Resection and retrieval were complete.                           Four sessile polyps were found in the transverse                            colon. The polyps were 3 to 5 mm in size. These                            polyps were removed with a cold snare. Resection                            and retrieval were complete.                           A 6 mm polyp was found in the descending colon. The                            polyp was sessile. The polyp was removed with a                            cold snare. Resection and retrieval  were complete.                           Multiple small and large-mouthed diverticula were                            found from ascending colon to sigmoid colon.                           Internal hemorrhoids were found during endoscopy.                            The hemorrhoids were small. Retroflexion in the                            rectum not performed due to narrowed rectal vault. Complications:  No immediate complications. Estimated Blood Loss:     Estimated blood loss was minimal. Impression:               - Two 3 to 5 mm polyps in the cecum, removed with a                            cold snare. Resected and retrieved.                           - One 8 mm polyp in the ascending colon, removed                            with a cold snare. Resected and retrieved.                           - Four 3 to 5 mm polyps in the transverse colon,                            removed with a cold snare. Resected and retrieved.                           - One 6 mm polyp in the descending colon, removed                            with a cold snare. Resected and retrieved.                           - Moderate diverticulosis from ascending colon to                            sigmoid colon.                           - Internal hemorrhoids. Recommendation:           - Patient has a contact number available for                            emergencies. The signs and symptoms of potential                            delayed complications were discussed with the                            patient. Return to normal activities tomorrow.                            Written discharge instructions were provided to the                            patient.                           - Resume previous diet.                           -  Continue present medications.                           - Resume Plavix (clopidogrel) at prior dose                            tomorrow. Refer to managing physician for  further                            adjustment of therapy.                           - No recommendation at this time regarding repeat                            colonoscopy due to age.                           - Primary care to monitor Hgb to ensure                            stability/improvement. Jerene Bears, MD 01/22/2018 9:56:55 AM This report has been signed electronically.

## 2018-01-23 ENCOUNTER — Telehealth: Payer: Self-pay

## 2018-01-23 NOTE — Telephone Encounter (Signed)
  Follow up Call-  Call back number 01/22/2018  Post procedure Call Back phone  # [phone]  Permission to leave phone message Yes  Some recent data might be hidden     Patient questions:  Do you have a fever, pain , or abdominal swelling? No. Pain Score  0 *  Have you tolerated food without any problems? Yes.    Have you been able to return to your normal activities? Yes.    Do you have any questions about your discharge instructions: Diet   No. Medications  No. Follow up visit  No.  Do you have questions or concerns about your Care? No.  Actions: * If pain score is 4 or above: No action needed, pain <4.

## 2018-01-25 ENCOUNTER — Encounter: Payer: Self-pay | Admitting: Internal Medicine

## 2018-01-25 ENCOUNTER — Ambulatory Visit (INDEPENDENT_AMBULATORY_CARE_PROVIDER_SITE_OTHER): Payer: Medicare Other

## 2018-01-25 DIAGNOSIS — E538 Deficiency of other specified B group vitamins: Secondary | ICD-10-CM

## 2018-01-25 MED ORDER — CYANOCOBALAMIN 1000 MCG/ML IJ SOLN
1000.0000 ug | Freq: Once | INTRAMUSCULAR | Status: AC
  Administered 2018-01-25: 1000 ug via INTRAMUSCULAR

## 2018-01-25 NOTE — Progress Notes (Addendum)
c  Medical screening examination/treatment/procedure(s) were performed by non-physician practitioner and as supervising physician I was immediately available for consultation/collaboration. I agree with above. Cathlean Cower, MD

## 2018-01-28 DIAGNOSIS — I6529 Occlusion and stenosis of unspecified carotid artery: Secondary | ICD-10-CM | POA: Insufficient documentation

## 2018-01-28 NOTE — Progress Notes (Signed)
Cardiology Office Note    Date:  01/29/2018   ID:  [name], DOB 06-19-1941, MRN [id number]  PCP:  Biagio Borg, MD  Cardiologist: Lauree Chandler, MD  No chief complaint on file.   History of Present Illness:  Victor Davis is a 77 y.o. male with history of CAD status post DES x2 to the LAD and DES x2 to the circumflex 02/2012, history of CVA, HTN, HLD, PAD, sinus bradycardia, CKD, lung CA and prostate CA, dementia, moderate carotid disease 11/2016, possible syncopal episode and 2016 cardiac monitor showed PVCs no arrhythmias.  Normal LV function on echo in 05/2017.  Last saw Dr. Angelena Form 10/01/2017 and was doing well.  Patient was hospitalized 11/2017 with symptomatic iron deficiency anemia hemoglobin is 6.6 possibly from chronic GI blood loss.  He was transfused, Plavix and aspirin were held for 5 days.  Several medications were adjusted he & was told to follow-up with [place].  His Aricept was decreased from 10 to 5 mg because of bradycardia.  Endoscopy 11/28/2017 was normal colonoscopy 01/22/2018 multiple polyps were removed, moderate diverticulosis, hemorrhoids.  Patient comes in today accompanied by his wife.  Denies chest pain, palpitations, dyspnea, dyspnea on exertion, dizziness or presyncope.  Pulse is 44 today.  I reviewed telemetry from the hospital and his heart rate was in the 60s.  He is asymptomatic with this.  EKG from 12/2016 his pulse was 42.  Aricept was decreased.  He is not on any other AV nodal blocking agents.  Blood pressure is up today but he has not taken his medications yet.  Biggest complaint is leaking urine.  He sees a urologist early July.  Not taking Plavix or aspirin with food.    Past Medical History:  Diagnosis Date  . Cancer ([place])    lung and prostate cancer per wife  . 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 CKD (chronic kidney disease), stage III ([place])   . Coronary artery disease    a. DES to LAD and Cx 02/2012.  [name] Dementia   . Dysuria 10/10/2010    . Emphysema 04/01/2012   By CT chest, august 2013  . ERECTILE DYSFUNCTION 04/05/2007  . GERD (gastroesophageal reflux disease)   . HYPERLIPIDEMIA 07/30/2008  . HYPERSOMNIA 10/02/2007  . Impaired glucose tolerance 02/09/2011  . INSOMNIA-SLEEP DISORDER-UNSPEC 10/22/2009  . LOW BACK PAIN 04/05/2007  . Mild carotid artery disease ([place])    a. 1-39% bilaterally by duplex 11/2016.  . OSTEOARTHRITIS 04/05/2007  . PEPTIC ULCER DISEASE 04/05/2007  . PERIPHERAL EDEMA 10/22/2009  . PROSTATE CANCER, HX OF 04/05/2007  . RESTLESS LEG SYNDROME 05/15/2007  . RHINITIS, ALLERGIC NOS 05/15/2007  . Sinus bradycardia 10/24/2010   a. prior h/o HR 40s, clonidine stopped 05/2017 due to this.  [name] Unspecified visual loss 07/15/2008  . UTI 10/24/2010    Past Surgical History:  Procedure Laterality Date  . CARDIAC CATHETERIZATION    . COLONOSCOPY N/A 11/28/2017   Procedure: COLONOSCOPY;  Surgeon: Yetta Flock, MD;  Location: [facility];  Service: Gastroenterology;  Laterality: N/A;  . ESOPHAGOGASTRODUODENOSCOPY N/A 11/28/2017   Procedure: ESOPHAGOGASTRODUODENOSCOPY (EGD);  Surgeon: Yetta Flock, MD;  Location: [facility];  Service: Gastroenterology;  Laterality: N/A;  . EYE SURGERY     right  . Inguinal herniorrhapy left  2002   x 2  . Left hip replacement    . LYMPH NODE DISSECTION Right 03/22/2015   Procedure: LYMPH NODE DISSECTION;  Surgeon: Melrose Nakayama, MD;  Location: [facility]  OR;  Service: Thoracic;  Laterality: Right;  . PERCUTANEOUS CORONARY STENT INTERVENTION (PCI-S) N/A 03/08/2012   Procedure: PERCUTANEOUS CORONARY STENT INTERVENTION (PCI-S);  Surgeon: Burnell Blanks, MD;  Location: [facility] CATH LAB;  Service: Cardiovascular;  Laterality: N/A;  . PROSTATECTOMY  2002   radical  . Right hip replacement  09/22/09  . ROTATOR CUFF REPAIR     left  . s/p ventral surgery  2009  . SEGMENTECOMY Right 03/22/2015   Procedure: RIGHT LOWER LOBE SUPERIOR SEGMENTECTOMY;  Surgeon: Melrose Nakayama,  MD;  Location: [facility];  Service: Thoracic;  Laterality: Right;  [name] VIDEO ASSISTED THORACOSCOPY Right 03/22/2015   Procedure: RIGHT VIDEO ASSISTED THORACOSCOPY;  Surgeon: Melrose Nakayama, MD;  Location: [facility] OR;  Service: Thoracic;  Laterality: Right;    Current Medications: Current Meds  Medication Sig  . abiraterone acetate (ZYTIGA) 500 MG tablet Take 1,000 mg by mouth daily. Take on an empty stomach 1 hour before or 2 hours after a meal.  . amLODipine (NORVASC) 10 MG tablet Take 10 mg by mouth daily.  . Ascorbic Acid (VITAMIN C) 100 MG tablet Take 500 mg by mouth daily.   [name] aspirin 81 MG chewable tablet Chew 1 tablet (81 mg total) by mouth daily. Start from 4/22  . clopidogrel (PLAVIX) 75 MG tablet TAKE 1 TABLET BY MOUTH  DAILY  . donepezil (ARICEPT) 5 MG tablet Take 1 tablet (5 mg total) by mouth at bedtime.  . gabapentin (NEURONTIN) 300 MG capsule Take 1 capsule (300 mg total) by mouth 2 (two) times daily.  . hydrALAZINE (APRESOLINE) 25 MG tablet TAKE 1 TABLET (25 MG TOTAL) BY MOUTH EVERY 8 (EIGHT) HOURS.  . isosorbide mononitrate (IMDUR) 30 MG 24 hr tablet Take 30 mg by mouth daily.  [name] lovastatin (MEVACOR) 40 MG tablet Take 40 mg at bedtime by mouth.   . meclizine (ANTIVERT) 25 MG tablet 12.5-25 mg as needed for dizziness. TAKE 1/2 - 1 TABLET BY MOUTH THREE TIMES A DAY AS NEEDED FOR DIZINESS   . nitroGLYCERIN (NITROSTAT) 0.4 MG SL tablet Place 1 tablet (0.4 mg total) under the tongue every 5 (five) minutes as needed for chest pain.  . pantoprazole (PROTONIX) 40 MG tablet Take 1 tablet (40 mg total) by mouth daily.  . pramipexole (MIRAPEX) 0.5 MG tablet Take 0.5 mg by mouth 3 (three) times daily.   . predniSONE (DELTASONE) 5 MG tablet Take 5 mg daily with breakfast by mouth.  . thiamine (VITAMIN B-1) 50 MG tablet Take 1 tablet (50 mg total) by mouth daily.   Current Facility-Administered Medications for the 01/29/18 encounter (Office Visit) with Imogene Burn, PA-C  Medication  . 0.9  %  sodium chloride infusion     Allergies:   Amitriptyline hcl; Diphenhydramine hcl; Simvastatin; Clonidine derivatives; and Tylenol [acetaminophen]   Social History   Socioeconomic History  . Marital status: Married    Spouse name: Not on file  . Number of children: 2  . Years of education: Not on file  . Highest education level: Not on file  Occupational History  . Occupation: truck driver-retired  Social Needs  . Financial resource strain: Not on file  . Food insecurity:    Worry: Not on file    Inability: Not on file  . Transportation needs:    Medical: Not on file    Non-medical: Not on file  Tobacco Use  . Smoking status: Former Smoker    Packs/day: 1.00    Years: 15.00  Pack years: 15.00    Types: Cigarettes    Last attempt to quit: 02/05/1977    Years since quitting: 41.0  . Smokeless tobacco: Never Used  Substance and Sexual Activity  . Alcohol use: No    Alcohol/week: 0.6 oz    Types: 1 Standard drinks or equivalent per week    Comment: RARE  . Drug use: No  . Sexual activity: Not Currently  Lifestyle  . Physical activity:    Days per week: Not on file    Minutes per session: Not on file  . Stress: Not on file  Relationships  . Social connections:    Talks on phone: Not on file    Gets together: Not on file    Attends religious service: Not on file    Active member of club or organization: Not on file    Attends meetings of clubs or organizations: Not on file    Relationship status: Not on file  Other Topics Concern  . Not on file  Social History Narrative  . Not on file     Family History:  The patient's family history includes Heart attack (age of onset: 69) in his father; Heart attack (age of onset: 47) in his brother; Lung cancer in his sister.   ROS:   Please see the history of present illness.    Review of Systems  Constitution: Positive for malaise/fatigue.  HENT: Negative.   Cardiovascular: Negative.   Respiratory: Positive for  snoring.   Endocrine: Negative.   Hematologic/Lymphatic: Bruises/bleeds easily.  Musculoskeletal: Positive for back pain and stiffness.  Gastrointestinal: Positive for diarrhea.  Genitourinary: Negative.   Neurological: Negative.   Psychiatric/Behavioral: Positive for memory loss. The patient is nervous/anxious.    All other systems reviewed and are negative.   PHYSICAL EXAM:   VS:  BP (!) 150/62   Pulse (!) 44   Ht 5\' 7"  (1.702 m)   Wt 137 lb (62.1 kg)   SpO2 97%   BMI 21.46 kg/m   Physical Exam  GEN: Well nourished, well developed, in no acute distress  Neck: no JVD, carotid bruits, or masses Cardiac:RRR; 1/6 systolic murmur at the left sternal border Respiratory:  clear to auscultation bilaterally, normal work of breathing GI: soft, nontender, nondistended, + BS Ext: Trace of right ankle edema without cyanosis, clubbing, , Good distal pulses bilaterally Neuro:  Alert and Oriented x 3 Psych: euthymic mood, full affect  Wt Readings from Last 3 Encounters:  01/29/18 137 lb (62.1 kg)  01/22/18 133 lb (60.3 kg)  01/11/18 133 lb (60.3 kg)      Studies/Labs Reviewed:   EKG:  EKG is  ordered today.  The ekg ordered today demonstrates sinus bradycardia at 44 bpm old septal infarct.  EKG similar to EKG 12/2016 when his heart rate was 42 bpm.  Recent Labs: 06/10/2017: Magnesium 2.5 11/26/2017: TSH 2.36 11/27/2017: ALT 11 12/03/2017: BUN 25; Creatinine, Ser 1.68; Potassium 4.6; Sodium 138 01/11/2018: Hemoglobin 9.5; Platelets 152.0   Lipid Panel    Component Value Date/Time   CHOL 90 11/26/2017 1200   TRIG 55.0 11/26/2017 1200   HDL 41.60 11/26/2017 1200   CHOLHDL 2 11/26/2017 1200   VLDL 11.0 11/26/2017 1200   LDLCALC 37 11/26/2017 1200   LDLDIRECT 104.9 02/10/2011 1224    Additional studies/ records that were reviewed today include:  Echo October 2018: - Left ventricle: The cavity size was normal. Wall thickness was   increased in a pattern of mild LVH.  Systolic  function was normal.   The estimated ejection fraction was in the range of 60% to 65%.   Wall motion was normal; there were no regional wall motion   abnormalities. - Aortic valve: There was mild regurgitation. - Mitral valve: Calcified annulus. There was mild regurgitation.    Carotid Dopplers 12/06/2016 1 to 39% bilateral ICA but recommended yearly follow-up because of amount of plaque   ASSESSMENT:    1. Coronary artery disease involving native coronary artery of native heart with angina pectoris ([place])   2. Essential hypertension   3. CKD (chronic kidney disease), stage III ([place])   4. Bilateral carotid artery stenosis   5. Sinus bradycardia      PLAN:  In order of problems listed above:  CAD status post DES x2 to the LAD and DES times to the circumflex 2013.  No angina.  Hypertension blood pressure high today but he has not taken his medications yet.  CKD creatinine 1.68 12/03/2017  Hyperlipidemia LDL 37 11/26/2017 continue lovastatin  Carotid disease bilateral 1 to 39% 11/2016 but recommended yearly because of the amount of plaque-schedule repeat carotid Dopplers.  Sinus bradycardia with heart rate in the 40s but has been this way for at least over a year.  Aricept decreased in the hospital 5 mg.  Telemetry strips reviewed in the hospital and pulse was in the 60s at times.  Patient is asymptomatic.  Will hold off on Holter monitor for now but I have asked them to call if he has any dizziness or presyncope.  Follow-up with Dr. Angelena Form in 6 months.    Medication Adjustments/Labs and Tests Ordered: Current medicines are reviewed at length with the patient today.  Concerns regarding medicines are outlined above.  Medication changes, Labs and Tests ordered today are listed in the Patient Instructions below. Patient Instructions  Medication Instructions:  Your physician recommends that you continue on your current medications as directed. Please refer to the Current Medication  list given to you today. START TAKING YOUR ASPIRIN AND PLAVIX WITH FOOD  Labwork: None ordered  Testing/Procedures: None ordered  Follow-Up: Your physician wants you to follow-up in: [place] DR. Angelena Form   You will receive a reminder letter in the mail two months in advance. If you don't receive a letter, please call our office to schedule the follow-up appointment.    Any Other Special Instructions Will Be Listed Below (If Applicable). 1.  CALL us IF YOU NOTICE ANY DIZZINESS OR PRE-SYNCOPE SYMPTOMS   If you need a refill on your cardiac medications before your next appointment, please call your pharmacy.      Sumner Boast, PA-C  01/29/2018 9:53 AM    [facility] [address], [place], [place]  [address] Phone: [phone]; Fax: [phone]

## 2018-01-29 ENCOUNTER — Ambulatory Visit (INDEPENDENT_AMBULATORY_CARE_PROVIDER_SITE_OTHER): Payer: Medicare Other | Admitting: Physician Assistant

## 2018-01-29 ENCOUNTER — Encounter: Payer: Self-pay | Admitting: Physician Assistant

## 2018-01-29 VITALS — BP 150/62 | HR 44 | Ht 67.0 in | Wt 137.0 lb

## 2018-01-29 DIAGNOSIS — I6523 Occlusion and stenosis of bilateral carotid arteries: Secondary | ICD-10-CM | POA: Diagnosis not present

## 2018-01-29 DIAGNOSIS — N183 Chronic kidney disease, stage 3 unspecified: Secondary | ICD-10-CM

## 2018-01-29 DIAGNOSIS — I10 Essential (primary) hypertension: Secondary | ICD-10-CM

## 2018-01-29 DIAGNOSIS — R001 Bradycardia, unspecified: Secondary | ICD-10-CM | POA: Diagnosis not present

## 2018-01-29 DIAGNOSIS — I25119 Atherosclerotic heart disease of native coronary artery with unspecified angina pectoris: Secondary | ICD-10-CM

## 2018-01-29 NOTE — Patient Instructions (Addendum)
Medication Instructions:  Your physician recommends that you continue on your current medications as directed. Please refer to the Current Medication list given to you today. START TAKING YOUR ASPIRIN AND PLAVIX WITH FOOD  Labwork: None ordered  Testing/Procedures: None ordered  Follow-Up: Your physician wants you to follow-up in: [place] DR. Angelena Form   You will receive a reminder letter in the mail two months in advance. If you don't receive a letter, please call our office to schedule the follow-up appointment.    Any Other Special Instructions Will Be Listed Below (If Applicable). 1.  CALL us IF YOU NOTICE ANY DIZZINESS OR PRE-SYNCOPE SYMPTOMS   If you need a refill on your cardiac medications before your next appointment, please call your pharmacy.

## 2018-02-01 ENCOUNTER — Other Ambulatory Visit: Payer: Self-pay

## 2018-02-01 MED ORDER — DONEPEZIL HCL 5 MG PO TABS: 5.0000 mg | ORAL_TABLET | Freq: Every day | ORAL | 2 refills | Status: DC

## 2018-02-05 ENCOUNTER — Ambulatory Visit (HOSPITAL_COMMUNITY)
Admission: RE | Admit: 2018-02-05 | Discharge: 2018-02-05 | Disposition: A | Discharge status: Home / Self Care | Payer: Medicare Other | Source: Ambulatory Visit | Attending: Cardiovascular Disease | Admitting: Cardiovascular Disease

## 2018-02-05 DIAGNOSIS — I6529 Occlusion and stenosis of unspecified carotid artery: Secondary | ICD-10-CM | POA: Diagnosis not present

## 2018-02-05 DIAGNOSIS — I6523 Occlusion and stenosis of bilateral carotid arteries: Secondary | ICD-10-CM | POA: Diagnosis not present

## 2018-02-08 IMAGING — CT CT CHEST W/O CM
2 of 3 series · 15 of 36 positions shown, 18 images · non-contrast
Comparison: 11/07/2016 chest CT.

CLINICAL DATA: Re- stage non-small cell right lower lobe lung
cancers status post wedge resection in June 2015. Prostate
cancer.

EXAM:
CT CHEST WITHOUT CONTRAST
TECHNIQUE: Multidetector CT imaging of the chest was performed following the
standard protocol without IV contrast.

[Series 2: thorax · axial · 0.66mm/px · z∈[+1093,+1387]mm · 12 of 173 slices shown, 15 images]
[im 13/173  mediastinal]
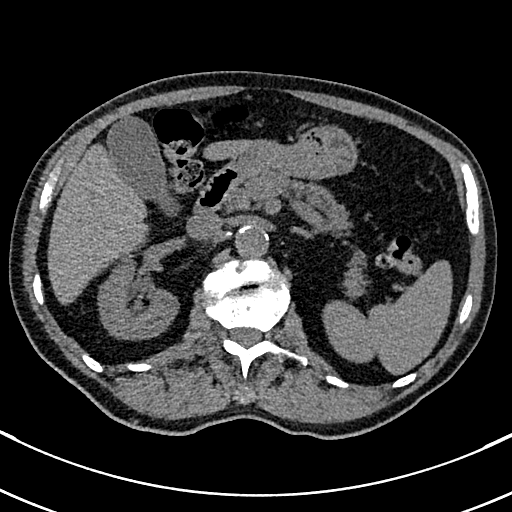
[im 13/173  lung]
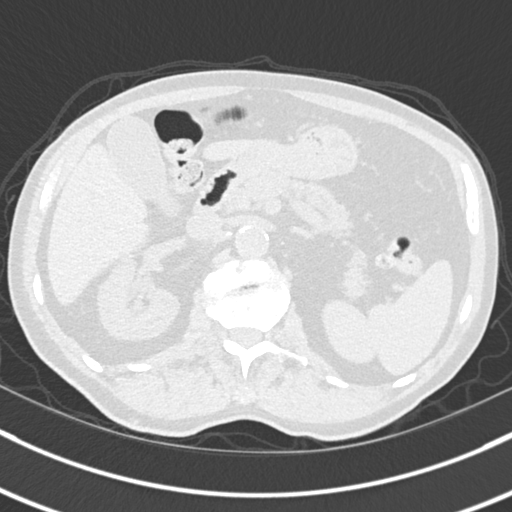
[im 26/173  lung]
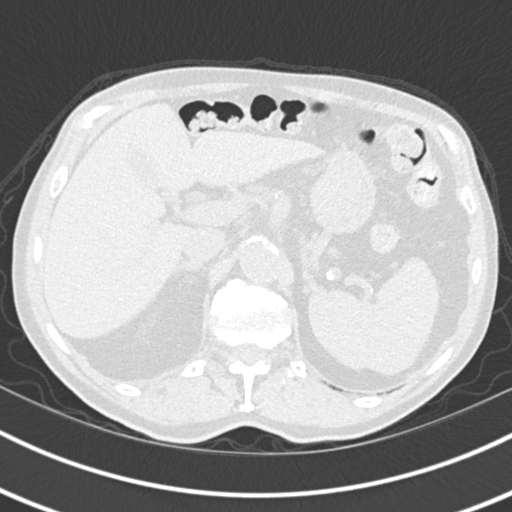
[im 39/173  lung]
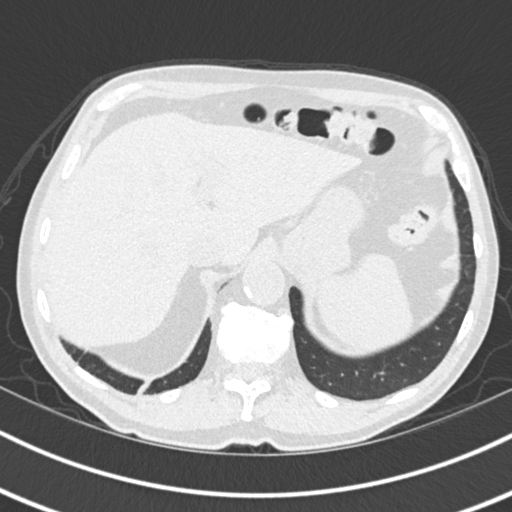
[im 51/173  lung]
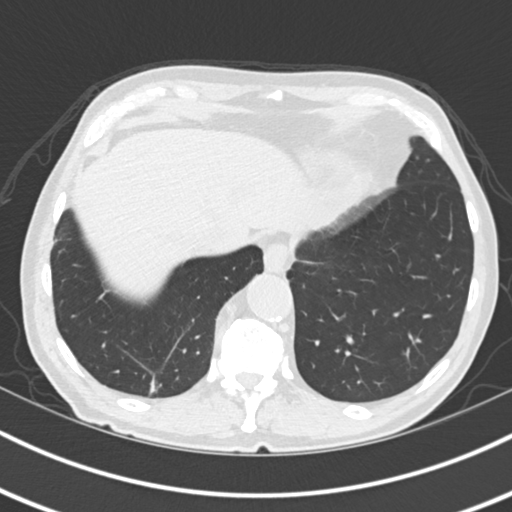
[im 64/173  mediastinal]
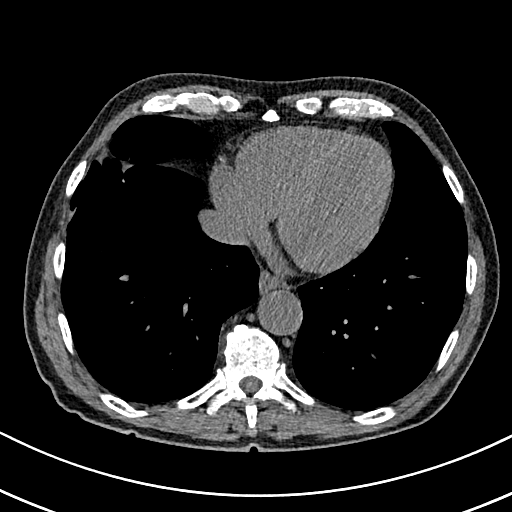
[im 64/173  lung]
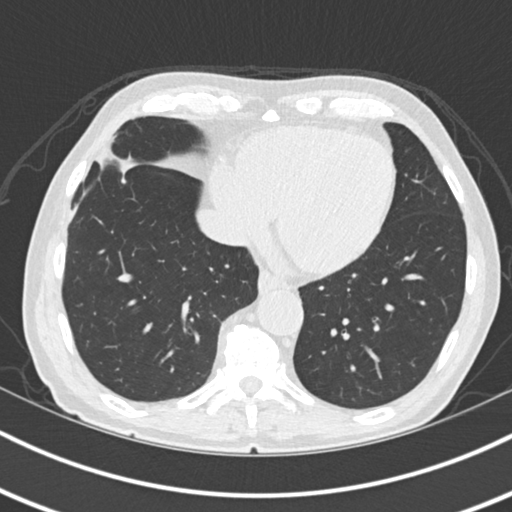
[im 77/173  lung]
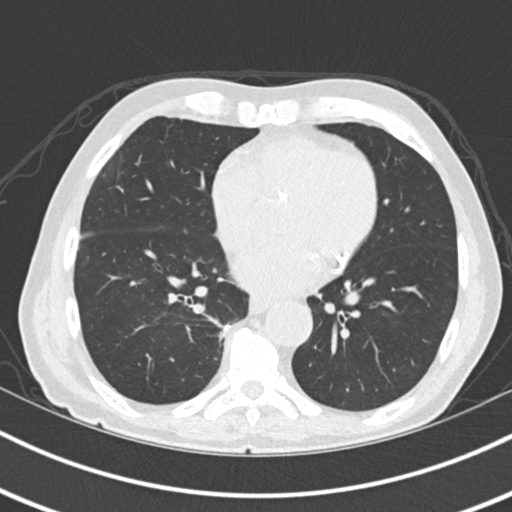
[im 96/173  lung]
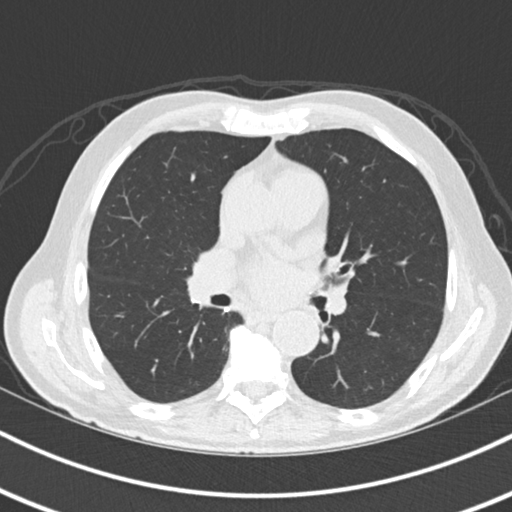
[im 109/173  lung]
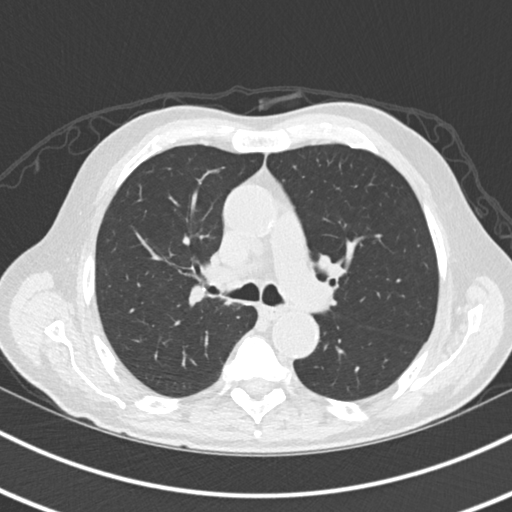
[im 122/173  mediastinal]
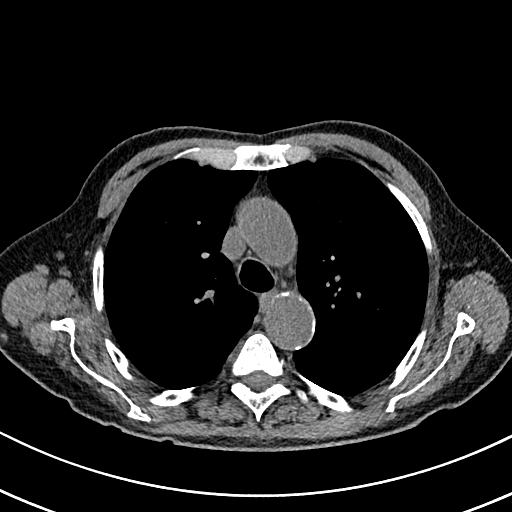
[im 122/173  lung]
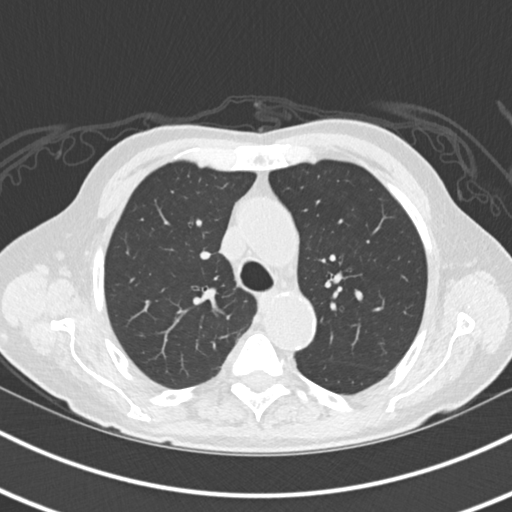
[im 134/173  lung]
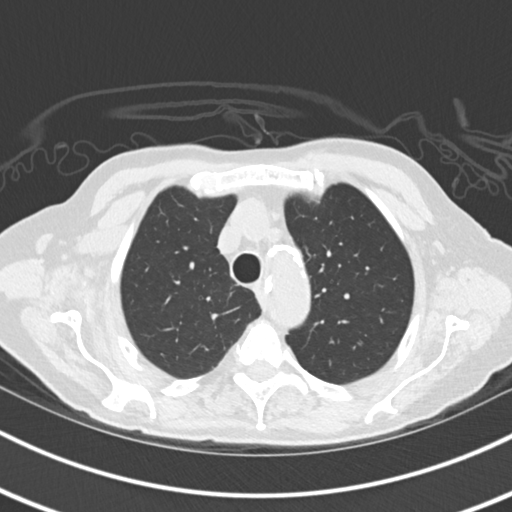
[im 147/173  lung]
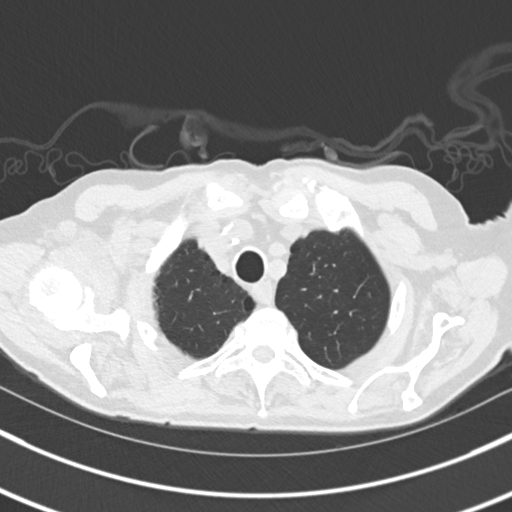
[im 160/173  lung]
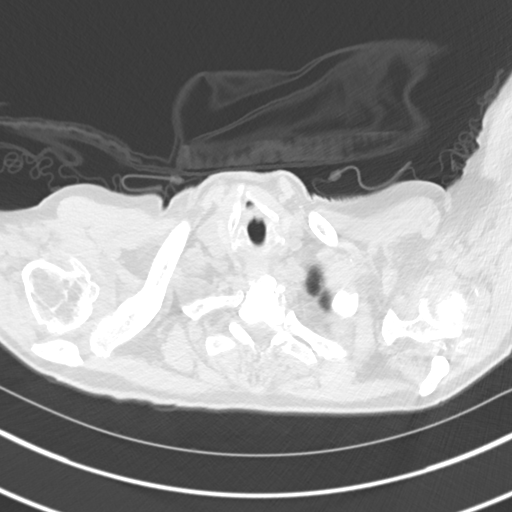

[Series 6: coronal · coronal · 0.67mm/px · 3 of 129 slices shown]
[im 26/129  lung]
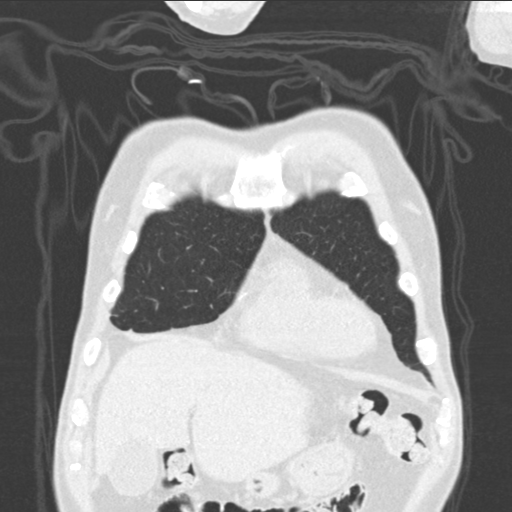
[im 52/129  lung]
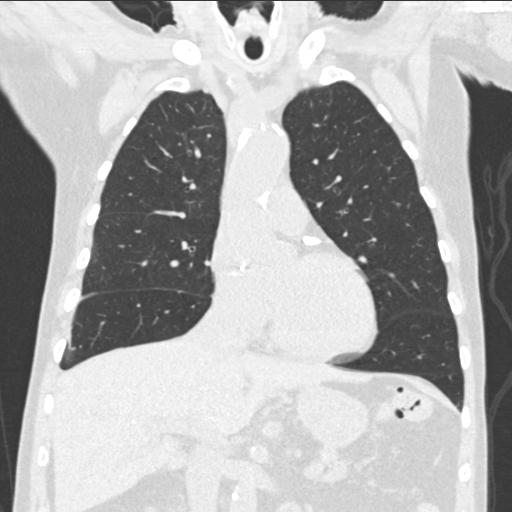
[im 77/129  lung]
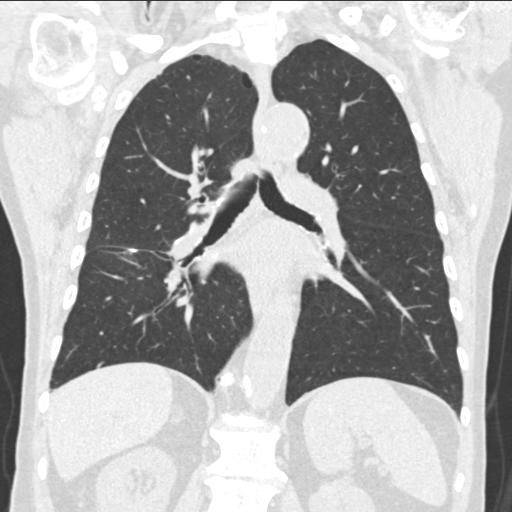

[15 of 36 positions shown; findings below may reference images not displayed]

FINDINGS: Cardiovascular: Normal heart size. No significant pericardial
fluid/thickening. Left main, left anterior descending, left
circumflex and right coronary atherosclerosis. Atherosclerotic
nonaneurysmal thoracic aorta. Normal caliber pulmonary arteries.

Mediastinum/Nodes: No discrete thyroid nodules. Unremarkable
esophagus. No pathologically enlarged axillary, mediastinal or gross
hilar lymph nodes, noting limited sensitivity for the detection of
hilar adenopathy on this noncontrast study.

Lungs/Pleura: No pneumothorax. No pleural effusion. Mild
centrilobular and paraseptal emphysema with mild diffuse bronchial
wall thickening. Stable postsurgical changes from right lower lobe
superior segmentectomy with associated mild scarring along the
suture line. No acute consolidative airspace disease or lung masses.
Peripheral left lower lobe 7 mm ground-glass pulmonary nodule
(series 5/ image 102), stable back to 02/23/2015 chest CT. No new
significant pulmonary nodules.

Upper abdomen: Cholelithiasis. Scattered colonic diverticulosis.

Musculoskeletal: No aggressive appearing focal osseous lesions.
Moderate thoracic spondylosis.
IMPRESSION: 1. No evidence of local tumor recurrence status post right lower
lobe superior segmentectomy.
2. No evidence of metastatic disease in the chest .
3. Solitary left lower lobe 7 mm ground-glass pulmonary nodule, for
which 2 year stability has been demonstrated. Repeat noncontrast
chest CT is recommended every 2 years until 5 years of stability has
been established. This recommendation follows the consensus
statement: Guidelines for Management of Incidental Pulmonary Nodules
Detected on CT Images: From the [HOSPITAL] 6238; Radiology
6238; [DATE].
4. Left main and 3 vessel coronary atherosclerosis.
5. Cholelithiasis.
6. Colonic diverticulosis.

Aortic Atherosclerosis (UL6RS-MZG.G) and Emphysema (UL6RS-ZAA.4).

## 2018-02-11 DIAGNOSIS — N281 Cyst of kidney, acquired: Secondary | ICD-10-CM | POA: Diagnosis not present

## 2018-02-11 DIAGNOSIS — C61 Malignant neoplasm of prostate: Secondary | ICD-10-CM | POA: Diagnosis not present

## 2018-02-26 ENCOUNTER — Ambulatory Visit (INDEPENDENT_AMBULATORY_CARE_PROVIDER_SITE_OTHER): Payer: Medicare Other

## 2018-02-26 DIAGNOSIS — E538 Deficiency of other specified B group vitamins: Secondary | ICD-10-CM

## 2018-02-26 MED ORDER — CYANOCOBALAMIN 1000 MCG/ML IJ SOLN
1000.0000 ug | Freq: Once | INTRAMUSCULAR | Status: AC
  Administered 2018-02-26: 1000 ug via INTRAMUSCULAR

## 2018-02-27 ENCOUNTER — Other Ambulatory Visit: Payer: Self-pay | Admitting: Internal Medicine

## 2018-02-27 MED ORDER — GABAPENTIN 300 MG PO CAPS: 300.0000 mg | ORAL_CAPSULE | Freq: Two times a day (BID) | ORAL | 5 refills | Status: DC

## 2018-02-27 NOTE — Telephone Encounter (Signed)
Copied from [place] [id number]. Topic: Quick Communication - Rx Refill/Question >> Feb 27, 2018  9:49 AM Bea Graff, NT wrote: Medication: gabapentin (NEURONTIN) 300 MG capsule  Has the patient contacted their pharmacy? Yes.   (Agent: If no, request that the patient contact the pharmacy for the refill.) (Agent: If yes, when and what did the pharmacy advise?)  Preferred Pharmacy (with phone number or street name): [facility], [place] [phone] (Phone) [phone] (Fax)      Agent: Please be advised that RX refills may take up to 3 business days. We ask that you follow-up with your pharmacy.

## 2018-02-27 NOTE — Telephone Encounter (Signed)
Refill of Neurontin  LOV 12/03/17 Dr. Jenny Reichmann  [facility] 11/28/17  #[phone]  0 refills    [place], [place] [employer]      [phone] (Phone) [phone] (Fax)

## 2018-02-27 NOTE — Telephone Encounter (Signed)
Done erx 

## 2018-03-04 ENCOUNTER — Other Ambulatory Visit: Payer: Self-pay | Admitting: Dermatology

## 2018-03-04 DIAGNOSIS — C44311 Basal cell carcinoma of skin of nose: Secondary | ICD-10-CM | POA: Diagnosis not present

## 2018-03-04 DIAGNOSIS — C44319 Basal cell carcinoma of skin of other parts of face: Secondary | ICD-10-CM | POA: Diagnosis not present

## 2018-03-12 ENCOUNTER — Other Ambulatory Visit: Payer: Self-pay | Admitting: Internal Medicine

## 2018-03-18 DIAGNOSIS — Z5111 Encounter for antineoplastic chemotherapy: Secondary | ICD-10-CM | POA: Diagnosis not present

## 2018-03-25 DIAGNOSIS — N183 Chronic kidney disease, stage 3 (moderate): Secondary | ICD-10-CM | POA: Diagnosis not present

## 2018-04-01 ENCOUNTER — Encounter: Payer: Self-pay | Admitting: Internal Medicine

## 2018-04-01 ENCOUNTER — Ambulatory Visit (INDEPENDENT_AMBULATORY_CARE_PROVIDER_SITE_OTHER): Payer: Medicare Other | Admitting: Internal Medicine

## 2018-04-01 ENCOUNTER — Ambulatory Visit: Payer: Medicare Other

## 2018-04-01 VITALS — BP 116/74 | HR 78 | Temp 98.5°F | Ht 67.0 in | Wt 140.0 lb

## 2018-04-01 DIAGNOSIS — S40022A Contusion of left upper arm, initial encounter: Secondary | ICD-10-CM | POA: Diagnosis not present

## 2018-04-01 DIAGNOSIS — R7302 Impaired glucose tolerance (oral): Secondary | ICD-10-CM

## 2018-04-01 DIAGNOSIS — E538 Deficiency of other specified B group vitamins: Secondary | ICD-10-CM | POA: Diagnosis not present

## 2018-04-01 DIAGNOSIS — I10 Essential (primary) hypertension: Secondary | ICD-10-CM

## 2018-04-01 DIAGNOSIS — S40029A Contusion of unspecified upper arm, initial encounter: Secondary | ICD-10-CM | POA: Insufficient documentation

## 2018-04-01 DIAGNOSIS — D649 Anemia, unspecified: Secondary | ICD-10-CM

## 2018-04-01 MED ORDER — CYANOCOBALAMIN 1000 MCG/ML IJ SOLN
1000.0000 ug | Freq: Once | INTRAMUSCULAR | Status: AC
Start: 2018-04-01 — End: 2018-04-01
  Administered 2018-04-01: 1000 ug via INTRAMUSCULAR

## 2018-04-01 NOTE — Assessment & Plan Note (Signed)
Large area due to fall, d/w pt and wife natural hx, should heal in 4 wks, no change in tx needed

## 2018-04-01 NOTE — Assessment & Plan Note (Signed)
stable overall by history and exam, recent data reviewed with pt, and pt to continue medical treatment as before,  to f/u any worsening symptoms or concerns  

## 2018-04-01 NOTE — Assessment & Plan Note (Signed)
For f/u lab today

## 2018-04-01 NOTE — Progress Notes (Signed)
Subjective:    Patient ID: Victor Davis, male    DOB: 1940-09-05, 77 y.o.   MRN: [id number]  HPI   Here with c/o left arm bruising seeming to get worse in last 2 days after a slip and fall, striking the arm on furniture on plavix.  Pain is mild, constant, nothing makes better or worse, just seemed to track distal from the lower bicep area.   Pt denies polydipsia, polyuria.  No other overt bleeding,  Last Hgb improved in may 2019.  Pt denies chest pain, increased sob or doe, wheezing, orthopnea, PND, increased LE swelling, palpitations, dizziness or syncope.  Pt denies new neurological symptoms such as new headache, or facial or extremity weakness or numbness.  Due for nasal skin cancer surgury next wk Past Medical History:  Diagnosis Date  . Cancer ([place])    lung and prostate cancer per wife  . 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 CKD (chronic kidney disease), stage III ([place])   . Coronary artery disease    a. DES to LAD and Cx 02/2012.  [name] Dementia   . Dysuria 10/10/2010  . Emphysema 04/01/2012   By CT chest, august 2013  . ERECTILE DYSFUNCTION 04/05/2007  . GERD (gastroesophageal reflux disease)   . HYPERLIPIDEMIA 07/30/2008  . HYPERSOMNIA 10/02/2007  . Impaired glucose tolerance 02/09/2011  . INSOMNIA-SLEEP DISORDER-UNSPEC 10/22/2009  . LOW BACK PAIN 04/05/2007  . Mild carotid artery disease ([place])    a. 1-39% bilaterally by duplex 11/2016.  . OSTEOARTHRITIS 04/05/2007  . PEPTIC ULCER DISEASE 04/05/2007  . PERIPHERAL EDEMA 10/22/2009  . PROSTATE CANCER, HX OF 04/05/2007  . RESTLESS LEG SYNDROME 05/15/2007  . RHINITIS, ALLERGIC NOS 05/15/2007  . Sinus bradycardia 10/24/2010   a. prior h/o HR 40s, clonidine stopped 05/2017 due to this.  [name] Unspecified visual loss 07/15/2008  . UTI 10/24/2010   Past Surgical History:  Procedure Laterality Date  . CARDIAC CATHETERIZATION    . COLONOSCOPY N/A 11/28/2017   Procedure: COLONOSCOPY;  Surgeon: Yetta Flock, MD;  Location: [facility];   Service: Gastroenterology;  Laterality: N/A;  . ESOPHAGOGASTRODUODENOSCOPY N/A 11/28/2017   Procedure: ESOPHAGOGASTRODUODENOSCOPY (EGD);  Surgeon: Yetta Flock, MD;  Location: [facility];  Service: Gastroenterology;  Laterality: N/A;  . EYE SURGERY     right  . Inguinal herniorrhapy left  2002   x 2  . Left hip replacement    . LYMPH NODE DISSECTION Right 03/22/2015   Procedure: LYMPH NODE DISSECTION;  Surgeon: Melrose Nakayama, MD;  Location: [place];  Service: Thoracic;  Laterality: Right;  . PERCUTANEOUS CORONARY STENT INTERVENTION (PCI-S) N/A 03/08/2012   Procedure: PERCUTANEOUS CORONARY STENT INTERVENTION (PCI-S);  Surgeon: Burnell Blanks, MD;  Location: [facility]ATH LAB;  Service: Cardiovascular;  Laterality: N/A;  . PROSTATECTOMY  2002   radical  . Right hip replacement  09/22/09  . ROTATOR CUFF REPAIR     left  . s/p ventral surgery  2009  . SEGMENTECOMY Right 03/22/2015   Procedure: RIGHT LOWER LOBE SUPERIOR SEGMENTECTOMY;  Surgeon: Melrose Nakayama, MD;  Location: [place];  Service: Thoracic;  Laterality: Right;  [name] VIDEO ASSISTED THORACOSCOPY Right 03/22/2015   Procedure: RIGHT VIDEO ASSISTED THORACOSCOPY;  Surgeon: Melrose Nakayama, MD;  Location: [place];  Service: Thoracic;  Laterality: Right;    reports that he quit smoking about 41 years ago. His smoking use included cigarettes. He has a 15.00 pack-year smoking history. He has never used smokeless tobacco. He reports that he  does not drink alcohol or use drugs. family history includes Heart attack (age of onset: 26) in his father; Heart attack (age of onset: 8) in his brother; Lung cancer in his sister. Allergies  Allergen Reactions  . Amitriptyline Hcl Other (See Comments)    REACTION: confusion  . Diphenhydramine Hcl Other (See Comments)    Keeps him awake  . Simvastatin Other (See Comments)    REACTION: leg pain  . Clonidine Derivatives     Dizziness, falls, bradycardia  . Tylenol [Acetaminophen] Other  (See Comments)    Dr advised pt not to take due to finding a spot on pt's kidney   Current Outpatient Medications on File Prior to Visit  Medication Sig Dispense Refill  . abiraterone acetate (ZYTIGA) 500 MG tablet Take 1,000 mg by mouth daily. Take on an empty stomach 1 hour before or 2 hours after a meal.    . amLODipine (NORVASC) 10 MG tablet Take 10 mg by mouth daily.    . Ascorbic Acid (VITAMIN C) 500 MG CAPS Take 500 mg by mouth daily.     [name] aspirin 81 MG chewable tablet Chew 1 tablet (81 mg total) by mouth daily. Start from 4/22 30 tablet 0  . clopidogrel (PLAVIX) 75 MG tablet TAKE 1 TABLET BY MOUTH  DAILY 90 tablet 1  . donepezil (ARICEPT) 5 MG tablet TAKE 1 TABLET BY MOUTH AT  BEDTIME 90 tablet 1  . gabapentin (NEURONTIN) 300 MG capsule Take 1 capsule (300 mg total) by mouth 2 (two) times daily. 60 capsule 5  . hydrALAZINE (APRESOLINE) 25 MG tablet TAKE 1 TABLET (25 MG TOTAL) BY MOUTH EVERY 8 (EIGHT) HOURS.  0  . isosorbide mononitrate (IMDUR) 30 MG 24 hr tablet TAKE 1 TABLET BY MOUTH  DAILY 90 tablet 1  . lovastatin (MEVACOR) 40 MG tablet TAKE 1 TABLET BY MOUTH AT  BEDTIME 90 tablet 1  . meclizine (ANTIVERT) 25 MG tablet 12.5-25 mg as needed for dizziness. TAKE 1/2 - 1 TABLET BY MOUTH THREE TIMES A DAY AS NEEDED FOR DIZINESS     . nitroGLYCERIN (NITROSTAT) 0.4 MG SL tablet Place 1 tablet (0.4 mg total) under the tongue every 5 (five) minutes as needed for chest pain. 25 tablet 3  . pantoprazole (PROTONIX) 40 MG tablet Take 1 tablet (40 mg total) by mouth daily. 90 tablet 1  . pramipexole (MIRAPEX) 0.5 MG tablet Take 0.5 mg by mouth 3 (three) times daily.     . predniSONE (DELTASONE) 5 MG tablet Take 5 mg daily with breakfast by mouth.    . thiamine (VITAMIN B-1) 50 MG tablet Take 1 tablet (50 mg total) by mouth daily. (Patient taking differently: Take 100 mg by mouth daily. ) 30 tablet 0   Current Facility-Administered Medications on File Prior to Visit  Medication Dose Route  Frequency Provider Last Rate Last Dose  . 0.9 %  sodium chloride infusion  500 mL Intravenous Once Pyrtle, Lajuan Lines, MD       Review of Systems  Constitutional: Negative for other unusual diaphoresis or sweats HENT: Negative for ear discharge or swelling Eyes: Negative for other worsening visual disturbances Respiratory: Negative for stridor or other swelling  Gastrointestinal: Negative for worsening distension or other blood Genitourinary: Negative for retention or other urinary change Musculoskeletal: Negative for other MSK pain or swelling Skin: Negative for color change or other new lesions Neurological: Negative for worsening tremors and other numbness  Psychiatric/Behavioral: Negative for worsening agitation or other fatigue All  other system neg per pt    Objective:   Physical Exam BP 116/74   Pulse 78   Temp 98.5 F (36.9 C) (Oral)   Ht 5\' 7"  (1.702 m)   Wt 140 lb (63.5 kg)   SpO2 96%   BMI 21.93 kg/m  VS noted,  Constitutional: Pt appears in NAD HENT: Head: NCAT.  Right Ear: External ear normal.  Left Ear: External ear normal.  Eyes: . Pupils are equal, round, and reactive to light. Conjunctivae and EOM are normal Nose: without d/c or deformity Neck: Neck supple. Gross normal ROM Cardiovascular: Normal rate and regular rhythm.   Pulmonary/Chest: Effort normal and breath sounds without rales or wheezing.  LUE with large area bruising anterior distal upper arm with some tracking for a few cm below antecub area Neurological: Pt is alert. At baseline orientation, motor grossly intact Skin: Skin is warm. No rashes, other new lesions, no LE edema Psychiatric: Pt behavior is normal without agitation  No other exam findings    Assessment & Plan:

## 2018-04-01 NOTE — Assessment & Plan Note (Signed)
stable overall by history and exam, recent data reviewed with pt, and pt to continue medical treatment as before,  to f/u any worsening symptoms or concerns Lab Results  Component Value Date   HGBA1C 5.5 11/26/2017

## 2018-04-01 NOTE — Patient Instructions (Signed)
Please continue all other medications as before, except to stop the Plavix for 5 days before the skin cancer nasal surgury  Please have the pharmacy call with any other refills you may need.  Please continue your efforts at being more active, low cholesterol diet, and weight control.  Please keep your appointments with your specialists as you may have planned  Please go to the LAB in the Basement (turn left off the elevator) for the tests to be done today  You will be contacted by phone if any changes need to be made immediately.  Otherwise, you will receive a letter about your results with an explanation, but please check with MyChart first.  Please remember to sign up for MyChart if you have not done so, as this will be important to you in the future with finding out test results, communicating by private email, and scheduling acute appointments online when needed.

## 2018-04-01 NOTE — Assessment & Plan Note (Addendum)
For IM b12 today 1000 mg

## 2018-04-02 ENCOUNTER — Encounter: Payer: Self-pay | Admitting: Internal Medicine

## 2018-04-02 ENCOUNTER — Other Ambulatory Visit (INDEPENDENT_AMBULATORY_CARE_PROVIDER_SITE_OTHER): Payer: Medicare Other

## 2018-04-02 DIAGNOSIS — I10 Essential (primary) hypertension: Secondary | ICD-10-CM

## 2018-04-02 DIAGNOSIS — R7302 Impaired glucose tolerance (oral): Secondary | ICD-10-CM | POA: Diagnosis not present

## 2018-04-02 DIAGNOSIS — D649 Anemia, unspecified: Secondary | ICD-10-CM

## 2018-04-02 DIAGNOSIS — E538 Deficiency of other specified B group vitamins: Secondary | ICD-10-CM | POA: Diagnosis not present

## 2018-04-02 LAB — BASIC METABOLIC PANEL
BUN: 26 mg/dL — AB (ref 6–23)
CALCIUM: 9.3 mg/dL (ref 8.4–10.5)
CO2: 26 mEq/L (ref 19–32)
Chloride: 109 mEq/L (ref 96–112)
Creatinine, Ser: 1.88 mg/dL — ABNORMAL HIGH (ref 0.40–1.50)
GFR: 37.02 mL/min — AB (ref >60.00)
Glucose, Bld: 86 mg/dL (ref 70–99)
Potassium: 3.8 mEq/L (ref 3.5–5.1)
Sodium: 143 mEq/L (ref 135–145)

## 2018-04-02 LAB — HEPATIC FUNCTION PANEL
ALT: 6 U/L (ref 0–53)
AST: 13 U/L (ref 0–37)
Albumin: 3.6 g/dL (ref 3.5–5.2)
Alkaline Phosphatase: 85 U/L (ref 39–117)
BILIRUBIN DIRECT: 0.2 mg/dL (ref 0.0–0.3)
TOTAL PROTEIN: 6.2 g/dL (ref 6.0–8.3)
Total Bilirubin: 1.2 mg/dL (ref 0.2–1.2)

## 2018-04-02 LAB — IBC PANEL
IRON: 46 ug/dL (ref 42–165)
Saturation Ratios: 9.4 % — ABNORMAL LOW (ref 20.0–50.0)
Transferrin: 350 mg/dL (ref 212.0–360.0)

## 2018-04-02 LAB — CBC WITH DIFFERENTIAL/PLATELET
Basophils Absolute: 0 10*3/uL (ref 0.0–0.1)
Basophils Relative: 0.3 % (ref 0.0–3.0)
EOS PCT: 0.9 % (ref 0.0–5.0)
Eosinophils Absolute: 0.1 10*3/uL (ref 0.0–0.7)
HEMATOCRIT: 27.3 % — AB (ref 39.0–52.0)
LYMPHS PCT: 23 % (ref 12.0–46.0)
Lymphs Abs: 1.5 10*3/uL (ref 0.7–4.0)
MCHC: 32.5 g/dL (ref 30.0–36.0)
MCV: 88.7 fl (ref 78.0–100.0)
MONO ABS: 0.6 10*3/uL (ref 0.1–1.0)
Monocytes Relative: 8.7 % (ref 3.0–12.0)
Neutro Abs: 4.4 10*3/uL (ref 1.4–7.7)
Neutrophils Relative %: 67.1 % (ref 43.0–77.0)
Platelets: 137 10*3/uL — ABNORMAL LOW (ref 150.0–400.0)
RBC: 3.08 Mil/uL — AB (ref 4.22–5.81)
RDW: 13.9 % (ref 11.5–15.5)
WBC: 6.5 10*3/uL (ref 4.0–10.5)

## 2018-04-02 LAB — LIPID PANEL
Cholesterol: 88 mg/dL (ref 0–200)
HDL: 33.9 mg/dL — ABNORMAL LOW (ref >39.00)
LDL Cholesterol: 38 mg/dL (ref 0–99)
NONHDL: 53.93
Total CHOL/HDL Ratio: 3
Triglycerides: 79 mg/dL (ref 0.0–149.0)
VLDL: 15.8 mg/dL (ref 0.0–40.0)

## 2018-04-02 LAB — HEMOGLOBIN A1C: HEMOGLOBIN A1C: 5.6 % (ref 4.6–6.5)

## 2018-04-02 LAB — TSH: TSH: 2.51 u[IU]/mL (ref 0.35–4.50)

## 2018-04-03 DIAGNOSIS — C3490 Malignant neoplasm of unspecified part of unspecified bronchus or lung: Secondary | ICD-10-CM | POA: Diagnosis not present

## 2018-04-03 DIAGNOSIS — I129 Hypertensive chronic kidney disease with stage 1 through stage 4 chronic kidney disease, or unspecified chronic kidney disease: Secondary | ICD-10-CM | POA: Diagnosis not present

## 2018-04-03 DIAGNOSIS — D509 Iron deficiency anemia, unspecified: Secondary | ICD-10-CM | POA: Diagnosis not present

## 2018-04-03 DIAGNOSIS — N183 Chronic kidney disease, stage 3 (moderate): Secondary | ICD-10-CM | POA: Diagnosis not present

## 2018-04-22 DIAGNOSIS — H35371 Puckering of macula, right eye: Secondary | ICD-10-CM | POA: Diagnosis not present

## 2018-04-22 DIAGNOSIS — H26491 Other secondary cataract, right eye: Secondary | ICD-10-CM | POA: Diagnosis not present

## 2018-04-30 ENCOUNTER — Other Ambulatory Visit: Payer: Self-pay | Admitting: Internal Medicine

## 2018-05-02 DIAGNOSIS — C44311 Basal cell carcinoma of skin of nose: Secondary | ICD-10-CM | POA: Diagnosis not present

## 2018-05-02 NOTE — Progress Notes (Addendum)
Subjective:   Victor Davis is a 77 y.o. male who presents for Medicare Annual/Subsequent preventive examination.  Review of Systems:  No ROS.  Medicare Wellness Visit. Additional risk factors are reflected in the social history.  Cardiac Risk Factors include: advanced age (>78men, >53 women);dyslipidemia;hypertension;male gender Sleep patterns: has interrupted sleep, has restless sleep, has frequent nighttime awakenings, gets up 2 times nightly to void and sleeps 3-4 hours nightly. Patient reports insomnia issues, discussed recommended sleep tips.   Home Safety/Smoke Alarms: Feels safe in home. Smoke alarms in place.  Living environment; residence and Firearm Safety: 1-story house/ trailer, no firearms. Lives with wife, no needs for DME, good support system Seat Belt Safety/Bike Helmet: Wears seat belt.   PSA-  Lab Results  Component Value Date   PSA 27.49 (H) 04/11/2017   PSA 6.39 (H) 10/20/2014   PSA 0.53 04/21/2014       Objective:    Vitals: BP (!) 154/65   Pulse 65   Resp 17   Ht 5\' 7"  (1.702 m)   Wt 140 lb (63.5 kg)   SpO2 98%   BMI 21.93 kg/m   Body mass index is 21.93 kg/m.  Advanced Directives 05/03/2018 11/26/2017 06/09/2017 06/07/2017 05/30/2017 04/11/2017 11/09/2016  Does Patient Have a Medical Advance Directive? Yes Yes No No Yes Yes No  Type of Paramedic of [place];Living will [place];Living will [place];Living will - [place];Living will [place];Living will -  Does patient want to make changes to medical advance directive? - No - Patient declined - - - - -  Copy of [place] in Chart? No - copy requested No - copy requested No - copy requested - No - copy requested No - copy requested -  Would patient like information on creating a medical advance directive? - - No - Patient declined - - - No - Patient declined  Pre-existing out of  facility DNR order (yellow form or pink MOST form) - - - - - - -    Tobacco Social History   Tobacco Use  Smoking Status Former Smoker  . Packs/day: 1.00  . Years: 15.00  . Pack years: 15.00  . Types: Cigarettes  . Last attempt to quit: 02/05/1977  . Years since quitting: 41.2  Smokeless Tobacco Never Used     Counseling given: Not Answered  Past Medical History:  Diagnosis Date  . Cancer ([place])    lung and prostate cancer per wife  . 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 CKD (chronic kidney disease), stage III ([place])   . Coronary artery disease    a. DES to LAD and Cx 02/2012.  [name] Dementia   . Dysuria 10/10/2010  . Emphysema 04/01/2012   By CT chest, august 2013  . ERECTILE DYSFUNCTION 04/05/2007  . GERD (gastroesophageal reflux disease)   . HYPERLIPIDEMIA 07/30/2008  . HYPERSOMNIA 10/02/2007  . Impaired glucose tolerance 02/09/2011  . INSOMNIA-SLEEP DISORDER-UNSPEC 10/22/2009  . LOW BACK PAIN 04/05/2007  . Mild carotid artery disease ([place])    a. 1-39% bilaterally by duplex 11/2016.  . OSTEOARTHRITIS 04/05/2007  . PEPTIC ULCER DISEASE 04/05/2007  . PERIPHERAL EDEMA 10/22/2009  . PROSTATE CANCER, HX OF 04/05/2007  . RESTLESS LEG SYNDROME 05/15/2007  . RHINITIS, ALLERGIC NOS 05/15/2007  . Sinus bradycardia 10/24/2010   a. prior h/o HR 40s, clonidine stopped 05/2017 due to this.  [name] Unspecified visual loss 07/15/2008  . UTI 10/24/2010  Past Surgical History:  Procedure Laterality Date  . CARDIAC CATHETERIZATION    . COLONOSCOPY N/A 11/28/2017   Procedure: COLONOSCOPY;  Surgeon: Yetta Flock, MD;  [facility];  Service: Gastroenterology;  Laterality: N/A;  . ESOPHAGOGASTRODUODENOSCOPY N/A 11/28/2017   Procedure: ESOPHAGOGASTRODUODENOSCOPY (EGD);  Surgeon: Yetta Flock, MD;  Location: [facility];  Service: Gastroenterology;  Laterality: N/A;  . EYE SURGERY     right  . Inguinal herniorrhapy left  2002   x 2  . Left hip replacement    . LYMPH NODE  DISSECTION Right 03/22/2015   Procedure: LYMPH NODE DISSECTION;  Surgeon: Melrose Nakayama, MD;  Location: [place];  Service: Thoracic;  Laterality: Right;  . PERCUTANEOUS CORONARY STENT INTERVENTION (PCI-S) N/A 03/08/2012   Procedure: PERCUTANEOUS CORONARY STENT INTERVENTION (PCI-S);  Surgeon: Burnell Blanks, MD;  Location: [facility] CATH LAB;  Service: Cardiovascular;  Laterality: N/A;  . PROSTATECTOMY  2002   radical  . Right hip replacement  09/22/09  . ROTATOR CUFF REPAIR     left  . s/p ventral surgery  2009  . SEGMENTECOMY Right 03/22/2015   Procedure: RIGHT LOWER LOBE SUPERIOR SEGMENTECTOMY;  Surgeon: Melrose Nakayama, MD;  Location: [place];  Service: Thoracic;  Laterality: Right;  [name] VIDEO ASSISTED THORACOSCOPY Right 03/22/2015   Procedure: RIGHT VIDEO ASSISTED THORACOSCOPY;  Surgeon: Melrose Nakayama, MD;  Location: [place];  Service: Thoracic;  Laterality: Right;   Family History  Problem Relation Age of Onset  . Heart attack Father 57       Smoker  . Heart attack Brother 9  . Lung cancer Sister   . Colon cancer Neg Hx    Social History   Socioeconomic History  . Marital status: Married    Spouse name: Not on file  . Number of children: 2  . Years of education: Not on file  . Highest education level: Not on file  Occupational History  . Occupation: truck driver-retired  Social Needs  . Financial resource strain: Somewhat hard  . Food insecurity:    Worry: Sometimes true    Inability: Sometimes true  . Transportation needs:    Medical: No    Non-medical: No  Tobacco Use  . Smoking status: Former Smoker    Packs/day: 1.00    Years: 15.00    Pack years: 15.00    Types: Cigarettes    Last attempt to quit: 02/05/1977    Years since quitting: 41.2  . Smokeless tobacco: Never Used  Substance and Sexual Activity  . Alcohol use: No    Alcohol/week: 1.0 standard drinks    Types: 1 Standard drinks or equivalent per week    Comment: RARE  . Drug use: No  . Sexual  activity: Not Currently  Lifestyle  . Physical activity:    Days per week: 0 days    Minutes per session: 0 min  . Stress: Not at all  Relationships  . Social connections:    Talks on phone: More than three times a week    Gets together: More than three times a week    Attends religious service: Never    Active member of club or organization: No    Attends meetings of clubs or organizations: Never    Relationship status: Married  Other Topics Concern  . Not on file  Social History Narrative  . Not on file    Outpatient Encounter Medications as of 05/03/2018  Medication Sig  . abiraterone acetate (ZYTIGA)  500 MG tablet Take 1,000 mg by mouth daily. Take on an empty stomach 1 hour before or 2 hours after a meal.  . amLODipine (NORVASC) 10 MG tablet Take 10 mg by mouth daily.  . Ascorbic Acid (VITAMIN C) 500 MG CAPS Take 500 mg by mouth daily.   [name] aspirin 81 MG chewable tablet Chew 1 tablet (81 mg total) by mouth daily. Start from 4/22  . citalopram (CELEXA) 20 MG tablet TAKE 1 TABLET DAILY BY  MOUTH.  . clopidogrel (PLAVIX) 75 MG tablet TAKE 1 TABLET BY MOUTH  DAILY  . donepezil (ARICEPT) 5 MG tablet TAKE 1 TABLET BY MOUTH AT  BEDTIME  . gabapentin (NEURONTIN) 300 MG capsule TAKE 1 CAPSULE BY MOUTH TWICE A DAY  . hydrALAZINE (APRESOLINE) 25 MG tablet TAKE 1 TABLET (25 MG TOTAL) BY MOUTH EVERY 8 (EIGHT) HOURS.  . isosorbide mononitrate (IMDUR) 30 MG 24 hr tablet TAKE 1 TABLET BY MOUTH  DAILY  . lovastatin (MEVACOR) 40 MG tablet TAKE 1 TABLET BY MOUTH AT  BEDTIME  . meclizine (ANTIVERT) 25 MG tablet 12.5-25 mg as needed for dizziness. TAKE 1/2 - 1 TABLET BY MOUTH THREE TIMES A DAY AS NEEDED FOR DIZINESS   . nitroGLYCERIN (NITROSTAT) 0.4 MG SL tablet Place 1 tablet (0.4 mg total) under the tongue every 5 (five) minutes as needed for chest pain.  . pantoprazole (PROTONIX) 40 MG tablet Take 1 tablet (40 mg total) by mouth daily.  . pramipexole (MIRAPEX) 0.5 MG tablet Take 0.5 mg by mouth  3 (three) times daily.   . predniSONE (DELTASONE) 5 MG tablet Take 5 mg daily with breakfast by mouth.  . thiamine (VITAMIN B-1) 50 MG tablet Take 1 tablet (50 mg total) by mouth daily. (Patient taking differently: Take 100 mg by mouth daily. )   Facility-Administered Encounter Medications as of 05/03/2018  Medication  . cyanocobalamin ((VITAMIN B-12)) injection 1,000 mcg    Activities of Daily Living In your present state of health, do you have any difficulty performing the following activities: 05/03/2018 11/26/2017  Hearing? N -  Vision? N -  Difficulty concentrating or making decisions? N -  Comment - -  Walking or climbing stairs? N -  Dressing or bathing? N -  Doing errands, shopping? Y N  Preparing Food and eating ? N -  In the past six months, have you accidently leaked urine? Y -  Do you have problems with loss of bowel control? N -  Managing your Medications? Y -  Managing your Finances? Y -  Housekeeping or managing your Housekeeping? Y -  Some recent data might be hidden    Patient Care Team: Biagio Borg, MD as PCP - General Angelena Form Annita Brod, MD as PCP - Cardiology (Cardiology) Burnell Blanks, MD as Consulting Physician (Cardiology)   Assessment:   This is a routine wellness examination for [place]. Physical assessment deferred to PCP.  Exercise Activities and Dietary recommendations Current Exercise Habits: The patient does not participate in regular exercise at present  Diet (meal preparation, eat out, water intake, caffeinated beverages, dairy products, fruits and vegetables): in general, an "unhealthy" diet. Reports poor appetite  Reviewed heart healthy diet. Encouraged patient to increase daily water and healthy fluid intake. Discussed supplementing with Ensure, samples and coupons provided. Suggested to eat small amounts frequently.    Goals    . <enter goal here>    . Be as healthy and as independent as possible    . Patient  Stated      Drink more water, eat healthy, maintain independence.       Fall Risk Fall Risk  05/03/2018 04/11/2017 10/08/2015 10/15/2013  Falls in the past year? Yes Yes Yes Yes  Number falls in past yr: 2 or more 2 or more 1 1  Injury with Fall? No - No No  Comment - - - tripped in the kitchen over a box  Risk Factor Category  High Fall Risk - - -  Risk for fall due to : Impaired balance/gait;Impaired mobility;History of fall(s) Impaired mobility;Impaired balance/gait - -  Follow up Education provided;Falls prevention discussed Falls prevention discussed;Education provided - -    Depression Screen PHQ 2/9 Scores 05/03/2018 04/11/2017 10/08/2015 10/15/2013  PHQ - 2 Score 1 2 0 1  PHQ- 9 Score 10 11 - -    Cognitive Function MMSE - Mini Mental State Exam 05/03/2018 04/11/2017  Not completed: Unable to complete -  Orientation to time - 0  Orientation to Place - 5  Registration - 3  Attention/ Calculation - 4  Recall - 2  Language- name 2 objects - 2  Language- repeat - 1  Language- follow 3 step command - 3  Language- read & follow direction - 1  Write a sentence - 1  Copy design - 1  Total score - 23        Immunization History  Administered Date(s) Administered  . Influenza Split 08/14/2011  . Influenza Whole 08/11/2009  . Influenza, High Dose Seasonal PF 04/22/2015, 04/24/2016, 04/11/2017  . Influenza,inj,Quad PF,6+ Mos 04/08/2013, 04/21/2014  . Pneumococcal Conjugate-13 10/15/2013  . Pneumococcal Polysaccharide-23 07/15/2008  . Td 08/14/2004, 10/20/2014  . Tdap 06/07/2017   Screening Tests Health Maintenance  Topic Date Due  . INFLUENZA VACCINE  03/14/2018  . COLONOSCOPY  01/22/2021  . TETANUS/TDAP  06/08/2027  . PNA vac Low Risk Adult  Completed       Plan:     Continue to eat heart healthy diet (full of fruits, vegetables, whole grains, lean protein, water--limit salt, fat, and sugar intake) and increase physical activity as tolerated.  I have personally reviewed and  noted the following in the patient's chart:   . Medical and social history . Use of alcohol, tobacco or illicit drugs  . Current medications and supplements . Functional ability and status . Nutritional status . Physical activity . Advanced directives . List of other physicians . Vitals . Screenings to include cognitive, depression, and falls . Referrals and appointments  In addition, I have reviewed and discussed with patient certain preventive protocols, quality metrics, and best practice recommendations. A written personalized care plan for preventive services as well as general preventive health recommendations were provided to patient.     Michiel Cowboy, RN  05/03/2018  Medical screening examination/treatment/procedure(s) were performed by non-physician practitioner and as supervising physician I was immediately available for consultation/collaboration. I agree with above. Cathlean Cower, MD

## 2018-05-03 ENCOUNTER — Ambulatory Visit (INDEPENDENT_AMBULATORY_CARE_PROVIDER_SITE_OTHER): Payer: Medicare Other | Admitting: *Deleted

## 2018-05-03 ENCOUNTER — Telehealth: Payer: Self-pay | Admitting: Interventional Cardiology

## 2018-05-03 VITALS — BP 154/65 | HR 65 | Resp 17 | Ht 67.0 in | Wt 140.0 lb

## 2018-05-03 DIAGNOSIS — Z0000 Encounter for general adult medical examination without abnormal findings: Secondary | ICD-10-CM | POA: Diagnosis not present

## 2018-05-03 DIAGNOSIS — E538 Deficiency of other specified B group vitamins: Secondary | ICD-10-CM

## 2018-05-03 DIAGNOSIS — Z23 Encounter for immunization: Secondary | ICD-10-CM | POA: Diagnosis not present

## 2018-05-03 MED ORDER — CITALOPRAM HYDROBROMIDE 20 MG PO TABS: 20.0000 mg | ORAL_TABLET | Freq: Every day | ORAL | 3 refills | Status: DC

## 2018-05-03 MED ORDER — CYANOCOBALAMIN 1000 MCG/ML IJ SOLN
1000.0000 ug | Freq: Once | INTRAMUSCULAR | Status: AC
  Administered 2018-05-03: 1000 ug via INTRAMUSCULAR

## 2018-05-03 NOTE — Patient Instructions (Addendum)
[email or web address] or down load app on smart phone [employer]renice [employer] website lists multiple social resources for individuals such as: food, health, money, house hold goods, transit, medical supplies, job training and legal services.  Continue to eat heart healthy diet (full of fruits, vegetables, whole grains, lean protein, water--limit salt, fat, and sugar intake) and increase physical activity as tolerated.   Victor Davis , Thank you for taking time to come for your Medicare Wellness Visit. I appreciate your ongoing commitment to your health goals. Please review the following plan we discussed and let me know if I can assist you in the future.   These are the goals we discussed: Goals    . <enter goal here>    . Be as healthy and as independent as possible    . Patient Stated     Drink more water, eat healthy, maintain independence.       This is a list of the screening recommended for you and due dates:  Health Maintenance  Topic Date Due  . Flu Shot  03/14/2018  . Colon Cancer Screening  01/22/2021  . Tetanus Vaccine  06/08/2027  . Pneumonia vaccines  Completed     Health Maintenance, Male A healthy lifestyle and preventive care is important for your health and wellness. Ask your health care provider about what schedule of regular examinations is right for you. What should I know about weight and diet? Eat a Healthy Diet  Eat plenty of vegetables, fruits, whole grains, low-fat dairy products, and lean protein.  Do not eat a lot of foods high in solid fats, added sugars, or salt.  Maintain a Healthy Weight Regular exercise can help you achieve or maintain a healthy weight. You should:  Do at least 150 minutes of exercise each week. The exercise should increase your heart rate and make you sweat (moderate-intensity exercise).  Do strength-training exercises at least twice a week.  Watch Your Levels of Cholesterol and Blood Lipids  Have your blood tested for lipids and  cholesterol every 5 years starting at 78 years of age. If you are at high risk for heart disease, you should start having your blood tested when you are 78 years old. You may need to have your cholesterol levels checked more often if: ? Your lipid or cholesterol levels are high. ? You are older than 78 years of age. ? You are at high risk for heart disease.  What should I know about cancer screening? Many types of cancers can be detected early and may often be prevented. Lung Cancer  You should be screened every year for lung cancer if: ? You are a current smoker who has smoked for at least 30 years. ? You are a former smoker who has quit within the past 15 years.  Talk to your health care provider about your screening options, when you should start screening, and how often you should be screened.  Colorectal Cancer  Routine colorectal cancer screening usually begins at 78 years of age and should be repeated every 5-10 years until you are 78 years old. You may need to be screened more often if early forms of precancerous polyps or small growths are found. Your health care provider may recommend screening at an earlier age if you have risk factors for colon cancer.  Your health care provider may recommend using home test kits to check for hidden blood in the stool.  A small camera at the end of a tube can be used to  examine your colon (sigmoidoscopy or colonoscopy). This checks for the earliest forms of colorectal cancer.  Prostate and Testicular Cancer  Depending on your age and overall health, your health care provider may do certain tests to screen for prostate and testicular cancer.  Talk to your health care provider about any symptoms or concerns you have about testicular or prostate cancer.  Skin Cancer  Check your skin from head to toe regularly.  Tell your health care provider about any new moles or changes in moles, especially if: ? There is a change in a mole's size, shape,  or color. ? You have a mole that is larger than a pencil eraser.  Always use sunscreen. Apply sunscreen liberally and repeat throughout the day.  Protect yourself by wearing long sleeves, pants, a wide-brimmed hat, and sunglasses when outside.  What should I know about heart disease, diabetes, and high blood pressure?  If you are 57-35 years of age, have your blood pressure checked every 3-5 years. If you are 49 years of age or older, have your blood pressure checked every year. You should have your blood pressure measured twice-once when you are at a hospital or clinic, and once when you are not at a hospital or clinic. Record the average of the two measurements. To check your blood pressure when you are not at a hospital or clinic, you can use: ? An automated blood pressure machine at a pharmacy. ? A home blood pressure monitor.  Talk to your health care provider about your target blood pressure.  If you are between 60-66 years old, ask your health care provider if you should take aspirin to prevent heart disease.  Have regular diabetes screenings by checking your fasting blood sugar level. ? If you are at a normal weight and have a low risk for diabetes, have this test once every three years after the age of 56. ? If you are overweight and have a high risk for diabetes, consider being tested at a younger age or more often.  A one-time screening for abdominal aortic aneurysm (AAA) by ultrasound is recommended for men aged 38-75 years who are current or former smokers. What should I know about preventing infection? Hepatitis B If you have a higher risk for hepatitis B, you should be screened for this virus. Talk with your health care provider to find out if you are at risk for hepatitis B infection. Hepatitis C Blood testing is recommended for:  Everyone born from [birth year] through 1966.  Anyone with known risk factors for hepatitis C.  Sexually Transmitted Diseases (STDs)  You should  be screened each year for STDs including gonorrhea and chlamydia if: ? You are sexually active and are younger than 78 years of age. ? You are older than 78 years of age and your health care provider tells you that you are at risk for this type of infection. ? Your sexual activity has changed since you were last screened and you are at an increased risk for chlamydia or gonorrhea. Ask your health care provider if you are at risk.  Talk with your health care provider about whether you are at high risk of being infected with HIV. Your health care provider may recommend a prescription medicine to help prevent HIV infection.  What else can I do?  Schedule regular health, dental, and eye exams.  Stay current with your vaccines (immunizations).  Do not use any tobacco products, such as cigarettes, chewing tobacco, and e-cigarettes. If you need  help quitting, ask your health care provider.  Limit alcohol intake to no more than 2 drinks per day. One drink equals 12 ounces of beer, 5 ounces of wine, or 1 ounces of hard liquor.  Do not use street drugs.  Do not share needles.  Ask your health care provider for help if you need support or information about quitting drugs.  Tell your health care provider if you often feel depressed.  Tell your health care provider if you have ever been abused or do not feel safe at home. This information is not intended to replace advice given to you by your health care provider. Make sure you discuss any questions you have with your health care provider. Document Released: 01/27/2008 Document Revised: 03/29/2016 Document Reviewed: 05/04/2015 Elsevier Interactive Patient Education  [employer].

## 2018-05-03 NOTE — Telephone Encounter (Signed)
Dr. Tamala Julian, DOD, spoke with Dr. Fara Chute from [facility].  Pt having skin procedure, still bleeding.  Dr. Tamala Julian advised ok to hold Plavix up to 7 days, then restart.

## 2018-05-23 ENCOUNTER — Telehealth: Payer: Self-pay | Admitting: Cardiovascular Disease

## 2018-05-23 DIAGNOSIS — C44311 Basal cell carcinoma of skin of nose: Secondary | ICD-10-CM | POA: Diagnosis not present

## 2018-05-23 DIAGNOSIS — Z85828 Personal history of other malignant neoplasm of skin: Secondary | ICD-10-CM | POA: Diagnosis not present

## 2018-05-23 NOTE — Telephone Encounter (Signed)
New Message       [facility] Pre-operative Risk Assessment    Request for surgical clearance:  1. What type of surgery is being performed? Mohs surgery   2. When is this surgery scheduled? TBD (hopefully in two weeks)   3. What type of clearance is required (medical clearance vs. Pharmacy clearance to hold med vs. Both)? Pharmacy  4. Are there any medications that need to be held prior to surgery and how long? Hold Plavix one day before    5. Practice name and name of physician performing surgery? [facility] [address] [address]    6. What is your office phone number [phone]   7.   What is your office fax number [phone]  8.   Anesthesia type (None, local, MAC, general) ? Local with lodocaine  Victor Davis 05/23/2018, 8:28 AM  _________________________________________________________________   (provider comments below)

## 2018-05-24 NOTE — Telephone Encounter (Signed)
Dr. Tamala Julian can pt hold Plavix for 1 day for MOHS procedure?

## 2018-05-26 NOTE — Telephone Encounter (Signed)
This is a [facility] patient.

## 2018-05-27 ENCOUNTER — Ambulatory Visit (HOSPITAL_COMMUNITY)
Admission: RE | Admit: 2018-05-27 | Discharge: 2018-05-27 | Disposition: A | Discharge status: Home / Self Care | Payer: Medicare Other | Source: Ambulatory Visit | Attending: Internal Medicine | Admitting: Internal Medicine

## 2018-05-27 ENCOUNTER — Inpatient Hospital Stay: Payer: Medicare Other | Attending: Internal Medicine

## 2018-05-27 ENCOUNTER — Encounter (HOSPITAL_COMMUNITY): Payer: Self-pay

## 2018-05-27 DIAGNOSIS — Z85118 Personal history of other malignant neoplasm of bronchus and lung: Secondary | ICD-10-CM | POA: Diagnosis not present

## 2018-05-27 DIAGNOSIS — K8020 Calculus of gallbladder without cholecystitis without obstruction: Secondary | ICD-10-CM | POA: Diagnosis not present

## 2018-05-27 DIAGNOSIS — N183 Chronic kidney disease, stage 3 (moderate): Secondary | ICD-10-CM | POA: Diagnosis not present

## 2018-05-27 DIAGNOSIS — I700 Atherosclerosis of aorta: Secondary | ICD-10-CM | POA: Insufficient documentation

## 2018-05-27 DIAGNOSIS — C3491 Malignant neoplasm of unspecified part of right bronchus or lung: Secondary | ICD-10-CM | POA: Insufficient documentation

## 2018-05-27 DIAGNOSIS — C3490 Malignant neoplasm of unspecified part of unspecified bronchus or lung: Secondary | ICD-10-CM | POA: Insufficient documentation

## 2018-05-27 DIAGNOSIS — D649 Anemia, unspecified: Secondary | ICD-10-CM | POA: Insufficient documentation

## 2018-05-27 DIAGNOSIS — J439 Emphysema, unspecified: Secondary | ICD-10-CM | POA: Diagnosis not present

## 2018-05-27 DIAGNOSIS — D696 Thrombocytopenia, unspecified: Secondary | ICD-10-CM | POA: Insufficient documentation

## 2018-05-27 DIAGNOSIS — Z8546 Personal history of malignant neoplasm of prostate: Secondary | ICD-10-CM | POA: Diagnosis present

## 2018-05-27 DIAGNOSIS — I2510 Atherosclerotic heart disease of native coronary artery without angina pectoris: Secondary | ICD-10-CM | POA: Insufficient documentation

## 2018-05-27 DIAGNOSIS — Z85828 Personal history of other malignant neoplasm of skin: Secondary | ICD-10-CM | POA: Insufficient documentation

## 2018-05-27 LAB — COMPREHENSIVE METABOLIC PANEL
ALK PHOS: 71 U/L (ref 38–126)
ALT: 9 U/L (ref 0–44)
AST: 16 U/L (ref 15–41)
Albumin: 3.5 g/dL (ref 3.5–5.0)
Anion gap: 6 (ref 5–15)
BILIRUBIN TOTAL: 1.1 mg/dL (ref 0.3–1.2)
BUN: 20 mg/dL (ref 8–23)
CO2: 29 mmol/L (ref 22–32)
CREATININE: 1.9 mg/dL — AB (ref 0.61–1.24)
Calcium: 9.3 mg/dL (ref 8.9–10.3)
Chloride: 107 mmol/L (ref 98–111)
GFR calc Af Amer: 37 mL/min — ABNORMAL LOW (ref >60)
GFR, EST NON AFRICAN AMERICAN: 32 mL/min — AB (ref >60)
Glucose, Bld: 127 mg/dL — ABNORMAL HIGH (ref 70–99)
Potassium: 3.6 mmol/L (ref 3.5–5.1)
Sodium: 142 mmol/L (ref 135–145)
TOTAL PROTEIN: 6.2 g/dL — AB (ref 6.5–8.1)

## 2018-05-27 LAB — CBC WITH DIFFERENTIAL/PLATELET
ABS IMMATURE GRANULOCYTES: 0.05 10*3/uL (ref 0.00–0.07)
BASOS PCT: 0 %
Basophils Absolute: 0 10*3/uL (ref 0.0–0.1)
EOS PCT: 2 %
Eosinophils Absolute: 0.1 10*3/uL (ref 0.0–0.5)
HCT: 30.6 % — ABNORMAL LOW (ref 39.0–52.0)
Hemoglobin: 9.6 g/dL — ABNORMAL LOW (ref 13.0–17.0)
Immature Granulocytes: 1 %
LYMPHS PCT: 15 %
Lymphs Abs: 1 10*3/uL (ref 0.7–4.0)
MCH: 29.4 pg (ref 26.0–34.0)
MCHC: 31.4 g/dL (ref 30.0–36.0)
MCV: 93.6 fL (ref 80.0–100.0)
MONO ABS: 0.5 10*3/uL (ref 0.1–1.0)
MONOS PCT: 8 %
Neutro Abs: 4.6 10*3/uL (ref 1.7–7.7)
Neutrophils Relative %: 74 %
PLATELETS: 92 10*3/uL — AB (ref 150–400)
RBC: 3.27 MIL/uL — ABNORMAL LOW (ref 4.22–5.81)
RDW: 13.2 % (ref 11.5–15.5)
WBC: 6.3 10*3/uL (ref 4.0–10.5)
nRBC: 0 % (ref 0.0–0.2)

## 2018-05-27 NOTE — Telephone Encounter (Signed)
Dr. Angelena Form can pt hold plavix for 1 day of MOHS procedure?

## 2018-05-27 NOTE — Telephone Encounter (Signed)
OK to hold Plavix for the procedure.   Victor Davis

## 2018-05-28 ENCOUNTER — Encounter: Payer: Self-pay | Admitting: Thoracic Surgery (Cardiothoracic Vascular Surgery)

## 2018-05-28 ENCOUNTER — Ambulatory Visit (INDEPENDENT_AMBULATORY_CARE_PROVIDER_SITE_OTHER): Payer: Medicare Other | Admitting: Thoracic Surgery (Cardiothoracic Vascular Surgery)

## 2018-05-28 ENCOUNTER — Encounter: Payer: Self-pay | Admitting: Internal Medicine

## 2018-05-28 ENCOUNTER — Ambulatory Visit (INDEPENDENT_AMBULATORY_CARE_PROVIDER_SITE_OTHER): Payer: Medicare Other | Admitting: Internal Medicine

## 2018-05-28 ENCOUNTER — Other Ambulatory Visit: Payer: Self-pay

## 2018-05-28 VITALS — BP 183/74 | HR 60 | Resp 16 | Ht 67.0 in | Wt 140.0 lb

## 2018-05-28 VITALS — BP 122/72 | HR 100 | Ht 67.0 in | Wt 141.0 lb

## 2018-05-28 DIAGNOSIS — N183 Chronic kidney disease, stage 3 unspecified: Secondary | ICD-10-CM

## 2018-05-28 DIAGNOSIS — E538 Deficiency of other specified B group vitamins: Secondary | ICD-10-CM

## 2018-05-28 DIAGNOSIS — C3491 Malignant neoplasm of unspecified part of right bronchus or lung: Secondary | ICD-10-CM

## 2018-05-28 DIAGNOSIS — D696 Thrombocytopenia, unspecified: Secondary | ICD-10-CM | POA: Insufficient documentation

## 2018-05-28 DIAGNOSIS — R7302 Impaired glucose tolerance (oral): Secondary | ICD-10-CM | POA: Diagnosis not present

## 2018-05-28 DIAGNOSIS — D518 Other vitamin B12 deficiency anemias: Secondary | ICD-10-CM

## 2018-05-28 DIAGNOSIS — Z85118 Personal history of other malignant neoplasm of bronchus and lung: Secondary | ICD-10-CM

## 2018-05-28 DIAGNOSIS — Z08 Encounter for follow-up examination after completed treatment for malignant neoplasm: Secondary | ICD-10-CM | POA: Diagnosis not present

## 2018-05-28 MED ORDER — CYANOCOBALAMIN 1000 MCG/ML IJ SOLN
1000.0000 ug | Freq: Once | INTRAMUSCULAR | Status: AC
  Administered 2018-05-28: 1000 ug via INTRAMUSCULAR

## 2018-05-28 NOTE — Assessment & Plan Note (Signed)
For b12 1000 mg IM today

## 2018-05-28 NOTE — Assessment & Plan Note (Signed)
Very recent onset, doubt significant issue with recent increased more than usual bleeding with nasal skin lesion surgury but will need f/u, for heme f/u tomorrow as planned

## 2018-05-28 NOTE — Progress Notes (Signed)
Subjective:    Patient ID: Victor Davis, male    DOB: 10-02-1940, 77 y.o.   MRN: [id number]  HPI  Here to f/u with family; overall doing ok,  Pt denies chest pain, increasing sob or doe, wheezing, orthopnea, PND, increased LE swelling, palpitations, dizziness or syncope.  Pt denies new neurological symptoms such as new headache, or facial or extremity weakness or numbness.  Pt denies polydipsia, polyuria, or low sugar episode.  Pt states overall good compliance with meds, mostly trying to follow appropriate diet, with wt overall stable, Due for B12.  No overt bleeding but platelets have been very recently lower 92K.  Did just have nasal surgury, and wife admits she did not have him stop the plavix before the surgury, and reports she was told there was increased bleeding with the procedure.  He will need a follow up procedure, and she agrees now it is best to temporarily hold the plavix as instructed 5 days ahead.  Had recently also started oral iron per nephrology due to anemia and iron deficiency.   Past Medical History:  Diagnosis Date  . Cancer ([place] )    lung and prostate cancer per wife  . 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 CKD (chronic kidney disease), stage III ([place])   . Coronary artery disease    a. DES to LAD and Cx 02/2012.  [name] Dementia ([place])   . Dysuria 10/10/2010  . Emphysema 04/01/2012   By CT chest, august 2013  . ERECTILE DYSFUNCTION 04/05/2007  . GERD (gastroesophageal reflux disease)   . HYPERLIPIDEMIA 07/30/2008  . HYPERSOMNIA 10/02/2007  . Impaired glucose tolerance 02/09/2011  . INSOMNIA-SLEEP DISORDER-UNSPEC 10/22/2009  . LOW BACK PAIN 04/05/2007  . Mild carotid artery disease ([place])    a. 1-39% bilaterally by duplex 11/2016.  . OSTEOARTHRITIS 04/05/2007  . PEPTIC ULCER DISEASE 04/05/2007  . PERIPHERAL EDEMA 10/22/2009  . PROSTATE CANCER, HX OF 04/05/2007  . RESTLESS LEG SYNDROME 05/15/2007  . RHINITIS, ALLERGIC NOS 05/15/2007  . Sinus bradycardia 10/24/2010   a. prior h/o  HR 40s, clonidine stopped 05/2017 due to this.  [name] Unspecified visual loss 07/15/2008  . UTI 10/24/2010   Past Surgical History:  Procedure Laterality Date  . CARDIAC CATHETERIZATION    . COLONOSCOPY N/A 11/28/2017   Procedure: COLONOSCOPY;  Surgeon: Yetta Flock, MD;  Location: [facility];  Service: Gastroenterology;  Laterality: N/A;  . ESOPHAGOGASTRODUODENOSCOPY N/A 11/28/2017   Procedure: ESOPHAGOGASTRODUODENOSCOPY (EGD);  Surgeon: Yetta Flock, MD;  Location: [facility];  Service: Gastroenterology;  Laterality: N/A;  . EYE SURGERY     right  . Inguinal herniorrhapy left  2002   x 2  . Left hip replacement    . LYMPH NODE DISSECTION Right 03/22/2015   Procedure: LYMPH NODE DISSECTION;  Surgeon: Melrose Nakayama, MD;  Location: [facility];  Service: Thoracic;  Laterality: Right;  . PERCUTANEOUS CORONARY STENT INTERVENTION (PCI-S) N/A 03/08/2012   Procedure: PERCUTANEOUS CORONARY STENT INTERVENTION (PCI-S);  Surgeon: Burnell Blanks, MD;  Location: [facility] CATH LAB;  Service: Cardiovascular;  Laterality: N/A;  . PROSTATECTOMY  2002   radical  . Right hip replacement  09/22/09  . ROTATOR CUFF REPAIR     left  . s/p ventral surgery  2009  . SEGMENTECOMY Right 03/22/2015   Procedure: RIGHT LOWER LOBE SUPERIOR SEGMENTECTOMY;  Surgeon: Melrose Nakayama, MD;  Location: [facility];  Service: Thoracic;  Laterality: Right;  [name] VIDEO ASSISTED THORACOSCOPY Right 03/22/2015   Procedure: RIGHT VIDEO ASSISTED  THORACOSCOPY;  Surgeon: Melrose Nakayama, MD;  Location: [facility];  Service: Thoracic;  Laterality: Right;    reports that he quit smoking about 41 years ago. His smoking use included cigarettes. He has a 15.00 pack-year smoking history. He has never used smokeless tobacco. He reports that he does not drink alcohol or use drugs. family history includes Heart attack (age of onset: 73) in his father; Heart attack (age of onset: 17) in his brother; Lung cancer in his sister. Allergies    Allergen Reactions  . Amitriptyline Hcl Other (See Comments)    REACTION: confusion  . Diphenhydramine Hcl Other (See Comments)    Keeps him awake  . Simvastatin Other (See Comments)    REACTION: leg pain  . Clonidine Derivatives     Dizziness, falls, bradycardia  . Tylenol [Acetaminophen] Other (See Comments)    Dr advised pt not to take due to finding a spot on pt's kidney   Current Outpatient Medications on File Prior to Visit  Medication Sig Dispense Refill  . abiraterone acetate (ZYTIGA) 500 MG tablet Take 1,000 mg by mouth daily. Take on an empty stomach 1 hour before or 2 hours after a meal.    . amLODipine (NORVASC) 10 MG tablet Take 10 mg by mouth daily.    . Ascorbic Acid (VITAMIN C) 500 MG CAPS Take 500 mg by mouth daily.     [name] aspirin 81 MG chewable tablet Chew 1 tablet (81 mg total) by mouth daily. Start from 4/22 30 tablet 0  . citalopram (CELEXA) 20 MG tablet Take 1 tablet (20 mg total) by mouth daily. 90 tablet 3  . clopidogrel (PLAVIX) 75 MG tablet TAKE 1 TABLET BY MOUTH  DAILY 90 tablet 1  . donepezil (ARICEPT) 5 MG tablet TAKE 1 TABLET BY MOUTH AT  BEDTIME 90 tablet 1  . gabapentin (NEURONTIN) 300 MG capsule TAKE 1 CAPSULE BY MOUTH TWICE A DAY 180 capsule 2  . hydrALAZINE (APRESOLINE) 25 MG tablet TAKE 1 TABLET (25 MG TOTAL) BY MOUTH EVERY 8 (EIGHT) HOURS.  0  . isosorbide mononitrate (IMDUR) 30 MG 24 hr tablet TAKE 1 TABLET BY MOUTH  DAILY 90 tablet 1  . lovastatin (MEVACOR) 40 MG tablet TAKE 1 TABLET BY MOUTH AT  BEDTIME 90 tablet 1  . Magnesium 500 MG TABS Take 500 mg by mouth.    . meclizine (ANTIVERT) 25 MG tablet 12.5-25 mg as needed for dizziness. TAKE 1/2 - 1 TABLET BY MOUTH THREE TIMES A DAY AS NEEDED FOR DIZINESS     . nitroGLYCERIN (NITROSTAT) 0.4 MG SL tablet Place 1 tablet (0.4 mg total) under the tongue every 5 (five) minutes as needed for chest pain. 25 tablet 3  . pantoprazole (PROTONIX) 40 MG tablet Take 1 tablet (40 mg total) by mouth daily. 90  tablet 1  . pramipexole (MIRAPEX) 0.5 MG tablet Take 0.5 mg by mouth 3 (three) times daily.     . predniSONE (DELTASONE) 5 MG tablet Take 5 mg daily with breakfast by mouth.    . thiamine (VITAMIN B-1) 50 MG tablet Take 1 tablet (50 mg total) by mouth daily. (Patient taking differently: Take 100 mg by mouth daily. ) 30 tablet 0   No current facility-administered medications on file prior to visit.    Review of Systems  Constitutional: Negative for other unusual diaphoresis or sweats HENT: Negative for ear discharge or swelling Eyes: Negative for other worsening visual disturbances Respiratory: Negative for stridor or other swelling  Gastrointestinal: Negative for worsening distension or other blood Genitourinary: Negative for retention or other urinary change Musculoskeletal: Negative for other MSK pain or swelling Skin: Negative for color change or other new lesions Neurological: Negative for worsening tremors and other numbness  Psychiatric/Behavioral: Negative for worsening agitation or other fatigue ALl other system neg per pt    Objective:   Physical Exam BP 122/72   Pulse 100   Ht 5\' 7"  (1.702 m)   Wt 141 lb (64 kg)   SpO2 96%   BMI 22.08 kg/m  VS noted,  Constitutional: Pt appears in NAD HENT: Head: NCAT.  Right Ear: External ear normal.  Left Ear: External ear normal.  Eyes: . Pupils are equal, round, and reactive to light. Conjunctivae and EOM are normal Nose: without d/c or deformity Neck: Neck supple. Gross normal ROM Cardiovascular: Normal rate and regular rhythm.   Pulmonary/Chest: Effort normal and breath sounds without rales or wheezing.  Abd:  Soft, NT, ND, + BS, no organomegaly Neurological: Pt is alert. At baseline orientation, motor grossly intact Skin: Skin is warm. No rashes, other new lesions, no LE edema Psychiatric: Pt behavior is normal without agitation  No other exam findings Lab Results  Component Value Date   WBC 6.3 05/27/2018   HGB 9.6 (L)  05/27/2018   HCT 30.6 (L) 05/27/2018   PLT 92 (L) 05/27/2018   GLUCOSE 127 (H) 05/27/2018   CHOL 88 04/02/2018   TRIG 79.0 04/02/2018   HDL 33.90 (L) 04/02/2018   LDLDIRECT 104.9 02/10/2011   LDLCALC 38 04/02/2018   ALT 9 05/27/2018   AST 16 05/27/2018   NA 142 05/27/2018   K 3.6 05/27/2018   CL 107 05/27/2018   CREATININE 1.90 (H) 05/27/2018   BUN 20 05/27/2018   CO2 29 05/27/2018   TSH 2.51 04/02/2018   PSA 27.49 (H) 04/11/2017   INR 0.99 11/27/2017   HGBA1C 5.6 04/02/2018   MICROALBUR 3.6 (H) 02/01/2010       Assessment & Plan:

## 2018-05-28 NOTE — Assessment & Plan Note (Signed)
Somewhat improved, cont all tx including iron

## 2018-05-28 NOTE — Assessment & Plan Note (Signed)
stable overall by history and exam, recent data reviewed with pt, and pt to continue medical treatment as before,  to f/u any worsening symptoms or concerns, cont nephrology as planned

## 2018-05-28 NOTE — Patient Instructions (Addendum)
You had the B12 shot today  Please continue all other medications as before, and refills have been done if requested.  Please have the pharmacy call with any other refills you may need.  Please continue your efforts at being more active, low cholesterol diet, and weight control.  Please keep your appointments with your specialists as you may have planned - Oncology tomorrow  Please return in 6 months, or sooner if needed

## 2018-05-28 NOTE — Assessment & Plan Note (Signed)
stable overall by history and exam, recent data reviewed with pt, and pt to continue medical treatment as before,  to f/u any worsening symptoms or concerns  

## 2018-05-28 NOTE — Progress Notes (Signed)
[address]       [place] [address]             [phone]     HPI: Victor Davis returns for a scheduled annual visit  Victor Davis is a 77 yo man with a remote history of tobacco abuse, atherosclerotic cardiovascular disease, hypertension, stage III chronic kidney disease, reflux, COPD, and skin cancer.  He recently had Mohs surgery for a skin cancer on his nose.  He had a thoracoscopic right lower lobe superior segmentectomy in August 2016.  I last saw him in the office in October 2018.  He was doing well at that time with no evidence of recurrent disease.  He has not had any respiratory issues.  His appetite is good.  His weight is stable.  Past Medical History:  Diagnosis Date  . Cancer ([place])    lung and prostate cancer per wife  . 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 CKD (chronic kidney disease), stage III ([place])   . Coronary artery disease    a. DES to LAD and Cx 02/2012.  [name] Dementia ([place])   . Dysuria 10/10/2010  . Emphysema 04/01/2012   By CT chest, august 2013  . ERECTILE DYSFUNCTION 04/05/2007  . GERD (gastroesophageal reflux disease)   . HYPERLIPIDEMIA 07/30/2008  . HYPERSOMNIA 10/02/2007  . Impaired glucose tolerance 02/09/2011  . INSOMNIA-SLEEP DISORDER-UNSPEC 10/22/2009  . LOW BACK PAIN 04/05/2007  . Mild carotid artery disease ([place])    a. 1-39% bilaterally by duplex 11/2016.  . OSTEOARTHRITIS 04/05/2007  . PEPTIC ULCER DISEASE 04/05/2007  . PERIPHERAL EDEMA 10/22/2009  . PROSTATE CANCER, HX OF 04/05/2007  . RESTLESS LEG SYNDROME 05/15/2007  . RHINITIS, ALLERGIC NOS 05/15/2007  . Sinus bradycardia 10/24/2010   a. prior h/o HR 40s, clonidine stopped 05/2017 due to this.  [name] Unspecified visual loss 07/15/2008  . UTI 10/24/2010    Current Outpatient Medications  Medication Sig Dispense Refill  . abiraterone acetate (ZYTIGA) 500 MG tablet Take 1,000 mg by mouth daily. Take on an empty stomach 1 hour before or 2 hours after a meal.    . amLODipine  (NORVASC) 10 MG tablet Take 10 mg by mouth daily.    . Ascorbic Acid (VITAMIN C) 500 MG CAPS Take 500 mg by mouth daily.     [name] aspirin 81 MG chewable tablet Chew 1 tablet (81 mg total) by mouth daily. Start from 4/22 30 tablet 0  . citalopram (CELEXA) 20 MG tablet Take 1 tablet (20 mg total) by mouth daily. 90 tablet 3  . clopidogrel (PLAVIX) 75 MG tablet TAKE 1 TABLET BY MOUTH  DAILY 90 tablet 1  . donepezil (ARICEPT) 5 MG tablet TAKE 1 TABLET BY MOUTH AT  BEDTIME 90 tablet 1  . gabapentin (NEURONTIN) 300 MG capsule TAKE 1 CAPSULE BY MOUTH TWICE A DAY 180 capsule 2  . hydrALAZINE (APRESOLINE) 25 MG tablet TAKE 1 TABLET (25 MG TOTAL) BY MOUTH EVERY 8 (EIGHT) HOURS.  0  . isosorbide mononitrate (IMDUR) 30 MG 24 hr tablet TAKE 1 TABLET BY MOUTH  DAILY 90 tablet 1  . lovastatin (MEVACOR) 40 MG tablet TAKE 1 TABLET BY MOUTH AT  BEDTIME 90 tablet 1  . Magnesium 500 MG TABS Take 500 mg by mouth.    . meclizine (ANTIVERT) 25 MG tablet 12.5-25 mg as needed for dizziness. TAKE 1/2 - 1 TABLET BY MOUTH THREE TIMES A DAY AS NEEDED FOR DIZINESS     .  nitroGLYCERIN (NITROSTAT) 0.4 MG SL tablet Place 1 tablet (0.4 mg total) under the tongue every 5 (five) minutes as needed for chest pain. 25 tablet 3  . pantoprazole (PROTONIX) 40 MG tablet Take 1 tablet (40 mg total) by mouth daily. 90 tablet 1  . pramipexole (MIRAPEX) 0.5 MG tablet Take 0.5 mg by mouth 3 (three) times daily.     . predniSONE (DELTASONE) 5 MG tablet Take 5 mg daily with breakfast by mouth.    . thiamine (VITAMIN B-1) 50 MG tablet Take 1 tablet (50 mg total) by mouth daily. (Patient taking differently: Take 100 mg by mouth daily. ) 30 tablet 0   No current facility-administered medications for this visit.     Physical Exam BP (!) 183/74 (BP Location: Right Arm, Patient Position: Sitting, Cuff Size: Normal)   Pulse 60   Resp 16   Ht 5\' 7"  (1.702 m)   Wt 140 lb (63.5 kg)   SpO2 98% Comment: ON RA  BMI 21.47 kg/m  77 year old man in no  acute distress Bandage on nose No cervical or subclavicular adenopathy Cardiac regular rate and rhythm with a 2/6 murmur Lungs clear with equal breath sounds bilaterally  Diagnostic Tests: CT CHEST WITHOUT CONTRAST  TECHNIQUE: Multidetector CT imaging of the chest was performed following the standard protocol without IV contrast.  COMPARISON:  05/08/2017 and 05/11/2016.  FINDINGS: Cardiovascular: Atherosclerotic calcification of the arterial vasculature, including three-vessel involvement of the coronary arteries and aortic valve. Heart size normal. No pericardial effusion.  Mediastinum/Nodes: Mediastinal lymph nodes are not enlarged by CT size criteria. Hilar regions are difficult to definitively evaluate without IV contrast. No axillary adenopathy. Esophagus is grossly unremarkable.  Lungs/Pleura: Centrilobular and paraseptal emphysema. Right lower lobectomy. Lungs are otherwise clear. No pleural fluid. Airway is otherwise unremarkable.  Upper Abdomen: Visualized portion of the liver is unremarkable. Stones are seen in the gallbladder. Visualized portions of the adrenal glands and right kidney are unremarkable. Partially imaged low-attenuation lesion in the lower pole left kidney, similar to the appearance on 05/11/2016. Visualized portions of the spleen, pancreas, stomach and bowel are grossly unremarkable. No upper abdominal adenopathy.  Musculoskeletal: Degenerative changes in the spine. No worrisome lytic or sclerotic lesions. Degenerative changes in the shoulders.  IMPRESSION: 1. No evidence of metastatic disease. 2. Aortic atherosclerosis (ICD10-170.0). Three-vessel coronary artery calcification. 3.  Emphysema (ICD10-J43.9). 4. Cholelithiasis.   Electronically Signed   By: Lorin Picket M.D.   On: 05/27/2018 10:27 Personally reviewed the CT images and concur with the findings noted above  Impression: Victor Davis is a 77 year old gentleman who  is now 3 years out from a right lower lobe superior segmentectomy and node sampling for a stage IA non-small cell carcinoma.  He has no evidence of recurrent disease.  He has an appointment with Dr. Julien Nordmann tomorrow  Skin cancer-recent Mohs surgery.  Dressing in place  Hypertension-blood pressure markedly elevated this morning.  He did not take his blood pressure medication this morning.  His blood pressure was normal when he saw Dr. Jenny Reichmann 2 months ago.  He will monitor this and follow-up with Dr. Jenny Reichmann or Dr. Angelena Form if it remains persistently high.  Plan: Monitor blood pressure and follow-up with Dr. Jenny Reichmann or Dr. Angelena Form if persistently high   Return in 1 year with CT chest  Melrose Nakayama, MD [facility] [phone]

## 2018-05-29 ENCOUNTER — Inpatient Hospital Stay: Payer: Medicare Other | Admitting: Internal Medicine

## 2018-05-29 ENCOUNTER — Telehealth: Payer: Self-pay

## 2018-05-29 ENCOUNTER — Encounter: Payer: Self-pay | Admitting: Internal Medicine

## 2018-05-29 VITALS — BP 146/69 | HR 63 | Temp 97.7°F | Resp 14 | Ht 67.0 in | Wt 141.8 lb

## 2018-05-29 DIAGNOSIS — N183 Chronic kidney disease, stage 3 (moderate): Secondary | ICD-10-CM | POA: Diagnosis not present

## 2018-05-29 DIAGNOSIS — D696 Thrombocytopenia, unspecified: Secondary | ICD-10-CM

## 2018-05-29 DIAGNOSIS — Z85118 Personal history of other malignant neoplasm of bronchus and lung: Secondary | ICD-10-CM | POA: Diagnosis not present

## 2018-05-29 DIAGNOSIS — D649 Anemia, unspecified: Secondary | ICD-10-CM

## 2018-05-29 DIAGNOSIS — Z85828 Personal history of other malignant neoplasm of skin: Secondary | ICD-10-CM | POA: Diagnosis not present

## 2018-05-29 DIAGNOSIS — Z8546 Personal history of malignant neoplasm of prostate: Secondary | ICD-10-CM

## 2018-05-29 DIAGNOSIS — C3491 Malignant neoplasm of unspecified part of right bronchus or lung: Secondary | ICD-10-CM

## 2018-05-29 DIAGNOSIS — C3490 Malignant neoplasm of unspecified part of unspecified bronchus or lung: Secondary | ICD-10-CM

## 2018-05-29 NOTE — Progress Notes (Signed)
[facility] [phone]   [phone]  OFFICE PROGRESS NOTE  Biagio Borg, MD [place] [address]  DIAGNOSIS:  1) Stage IA (T1a, N0, M0) non-small cell lung cancer, well-differentiated adenocarcinoma diagnosed in July 2016. 2) history of prostate adenocarcinoma managed [facility] Tresa [facility]  PRIOR THERAPY: Status post right lower lobe superior segmentectomy with lymph node sampling under the care of Dr. Roxan Hockey on 03/22/2015.  CURRENT THERAPY: Observation.  INTERVAL HISTORY: Victor Davis 77 y.o. male returns to the clinic today for follow-up visit accompanied by his wife.  The patient is feeling fine today with no specific complaints except for recent skin surgery for basal cell carcinoma of the face.  He had a lot of bleeding during his surgery because he was on Plavix that was not discontinued before the treatment.  The patient denied having any current chest pain, shortness of breath, cough or hemoptysis.  He continues to have mild fatigue.  He has no nausea, vomiting, diarrhea or constipation.  Has been in observation since August 2016.  He had repeat CT scan of the chest performed recently and he is here for evaluation and discussion of the scan results.  MEDICAL HISTORY: Past Medical History:  Diagnosis Date  . Cancer ([place])    lung and prostate cancer per wife  . 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 CKD (chronic kidney disease), stage III ([place])   . Coronary artery disease    a. DES to LAD and Cx 02/2012.  [name] Dementia ([place])   . Dysuria 10/10/2010  . Emphysema 04/01/2012   By CT chest, august 2013  . ERECTILE DYSFUNCTION 04/05/2007  . GERD (gastroesophageal reflux disease)   . HYPERLIPIDEMIA 07/30/2008  . HYPERSOMNIA 10/02/2007  . Impaired glucose tolerance 02/09/2011  . INSOMNIA-SLEEP DISORDER-UNSPEC 10/22/2009  . LOW BACK PAIN 04/05/2007  . Mild carotid artery disease ([place])    a. 1-39%  bilaterally by duplex 11/2016.  . OSTEOARTHRITIS 04/05/2007  . PEPTIC ULCER DISEASE 04/05/2007  . PERIPHERAL EDEMA 10/22/2009  . PROSTATE CANCER, HX OF 04/05/2007  . RESTLESS LEG SYNDROME 05/15/2007  . RHINITIS, ALLERGIC NOS 05/15/2007  . Sinus bradycardia 10/24/2010   a. prior h/o HR 40s, clonidine stopped 05/2017 due to this.  [name] Unspecified visual loss 07/15/2008  . UTI 10/24/2010    ALLERGIES:  is allergic to amitriptyline hcl; diphenhydramine hcl; simvastatin; clonidine derivatives; and tylenol [acetaminophen].  MEDICATIONS:  Current Outpatient Medications  Medication Sig Dispense Refill  . abiraterone acetate (ZYTIGA) 500 MG tablet Take 1,000 mg by mouth daily. Take on an empty stomach 1 hour before or 2 hours after a meal.    . amLODipine (NORVASC) 10 MG tablet Take 10 mg by mouth daily.    . Ascorbic Acid (VITAMIN C) 500 MG CAPS Take 500 mg by mouth daily.     [name] aspirin 81 MG chewable tablet Chew 1 tablet (81 mg total) by mouth daily. Start from 4/22 30 tablet 0  . citalopram (CELEXA) 20 MG tablet Take 1 tablet (20 mg total) by mouth daily. 90 tablet 3  . clopidogrel (PLAVIX) 75 MG tablet TAKE 1 TABLET BY MOUTH  DAILY 90 tablet 1  . donepezil (ARICEPT) 5 MG tablet TAKE 1 TABLET BY MOUTH AT  BEDTIME 90 tablet 1  . ferrous sulfate 325 (65 FE) MG tablet Take 325 mg by mouth 3 (three) times daily.  1  . gabapentin (NEURONTIN) 300 MG  capsule TAKE 1 CAPSULE BY MOUTH TWICE A DAY 180 capsule 2  . hydrALAZINE (APRESOLINE) 25 MG tablet TAKE 1 TABLET (25 MG TOTAL) BY MOUTH EVERY 8 (EIGHT) HOURS.  0  . isosorbide mononitrate (IMDUR) 30 MG 24 hr tablet TAKE 1 TABLET BY MOUTH  DAILY 90 tablet 1  . lovastatin (MEVACOR) 40 MG tablet TAKE 1 TABLET BY MOUTH AT  BEDTIME 90 tablet 1  . Magnesium 500 MG TABS Take 500 mg by mouth.    . meclizine (ANTIVERT) 25 MG tablet 12.5-25 mg as needed for dizziness. TAKE 1/2 - 1 TABLET BY MOUTH THREE TIMES A DAY AS NEEDED FOR DIZINESS     . pantoprazole (PROTONIX) 40 MG  tablet Take 1 tablet (40 mg total) by mouth daily. 90 tablet 1  . pramipexole (MIRAPEX) 0.5 MG tablet Take 0.5 mg by mouth 3 (three) times daily.     . predniSONE (DELTASONE) 5 MG tablet Take 5 mg daily with breakfast by mouth.    . thiamine (VITAMIN B-1) 50 MG tablet Take 1 tablet (50 mg total) by mouth daily. (Patient taking differently: Take 100 mg by mouth daily. ) 30 tablet 0  . nitroGLYCERIN (NITROSTAT) 0.4 MG SL tablet Place 1 tablet (0.4 mg total) under the tongue every 5 (five) minutes as needed for chest pain. (Patient not taking: Reported on 05/29/2018) 25 tablet 3   No current facility-administered medications for this visit.     SURGICAL HISTORY:  Past Surgical History:  Procedure Laterality Date  . CARDIAC CATHETERIZATION    . COLONOSCOPY N/A 11/28/2017   Procedure: COLONOSCOPY;  Surgeon: Yetta Flock, MD;  Location: [facility];  Service: Gastroenterology;  Laterality: N/A;  . ESOPHAGOGASTRODUODENOSCOPY N/A 11/28/2017   Procedure: ESOPHAGOGASTRODUODENOSCOPY (EGD);  Surgeon: Yetta Flock, MD;  Location: [facility];  Service: Gastroenterology;  Laterality: N/A;  . EYE SURGERY     right  . Inguinal herniorrhapy left  2002   x 2  . Left hip replacement    . LYMPH NODE DISSECTION Right 03/22/2015   Procedure: LYMPH NODE DISSECTION;  Surgeon: Melrose Nakayama, MD;  Location: [facility];  Service: Thoracic;  Laterality: Right;  . PERCUTANEOUS CORONARY STENT INTERVENTION (PCI-S) N/A 03/08/2012   Procedure: PERCUTANEOUS CORONARY STENT INTERVENTION (PCI-S);  Surgeon: Burnell Blanks, MD;  Location: [facility];  Service: Cardiovascular;  Laterality: N/A;  . PROSTATECTOMY  2002   radical  . Right hip replacement  09/22/09  . ROTATOR CUFF REPAIR     left  . s/p ventral surgery  2009  . SEGMENTECOMY Right 03/22/2015   Procedure: RIGHT LOWER LOBE SUPERIOR SEGMENTECTOMY;  Surgeon: Melrose Nakayama, MD;  Location: [facility];  Service: Thoracic;  Laterality: Right;  [name]  VIDEO ASSISTED THORACOSCOPY Right 03/22/2015   Procedure: RIGHT VIDEO ASSISTED THORACOSCOPY;  Surgeon: Melrose Nakayama, MD;  Location: [facility];  Service: Thoracic;  Laterality: Right;    REVIEW OF SYSTEMS:  A comprehensive review of systems was negative except for: Constitutional: positive for fatigue   PHYSICAL EXAMINATION: General appearance: alert, cooperative, fatigued and no distress Head: Normocephalic, without obvious abnormality, atraumatic Neck: no adenopathy, no JVD, supple, symmetrical, trachea midline and thyroid not enlarged, symmetric, no tenderness/mass/nodules Lymph nodes: Cervical, supraclavicular, and axillary nodes normal. Resp: clear to auscultation bilaterally Back: symmetric, no curvature. ROM normal. No CVA tenderness. Cardio: regular rate and rhythm, S1, S2 normal, no murmur, click, rub or gallop GI: soft, non-tender; bowel sounds normal; no masses,  no organomegaly Extremities:  extremities normal, atraumatic, no cyanosis or edema  ECOG PERFORMANCE STATUS: 1 - Symptomatic but completely ambulatory  Blood pressure (!) 146/69, pulse 63, temperature 97.7 F (36.5 C), temperature source Oral, resp. rate 14, height 5\' 7"  (1.702 m), weight 141 lb 12.8 oz (64.3 kg), SpO2 98 %.  LABORATORY DATA: Lab Results  Component Value Date   WBC 6.3 05/27/2018   HGB 9.6 (L) 05/27/2018   HCT 30.6 (L) 05/27/2018   MCV 93.6 05/27/2018   PLT 92 (L) 05/27/2018      Chemistry      Component Value Date/Time   NA 142 05/27/2018 0845   NA 139 05/08/2017 0826   K 3.6 05/27/2018 0845   K 3.9 05/08/2017 0826   CL 107 05/27/2018 0845   CO2 29 05/27/2018 0845   CO2 25 05/08/2017 0826   BUN 20 05/27/2018 0845   BUN 27.5 (H) 05/08/2017 0826   CREATININE 1.90 (H) 05/27/2018 0845   CREATININE 1.6 (H) 05/08/2017 0826      Component Value Date/Time   CALCIUM 9.3 05/27/2018 0845   CALCIUM 9.7 05/08/2017 0826   ALKPHOS 71 05/27/2018 0845   ALKPHOS 76 05/08/2017 0826   AST 16  05/27/2018 0845   AST 14 05/08/2017 0826   ALT 9 05/27/2018 0845   ALT <6 05/08/2017 0826   BILITOT 1.1 05/27/2018 0845   BILITOT 0.77 05/08/2017 0826       RADIOGRAPHIC STUDIES: Ct Chest Wo Contrast  Result Date: 05/27/2018 CLINICAL DATA:  Right lung cancer.  Remote history prostate cancer. EXAM: CT CHEST WITHOUT CONTRAST TECHNIQUE: Multidetector CT imaging of the chest was performed following the standard protocol without IV contrast. COMPARISON:  05/08/2017 and 05/11/2016. FINDINGS: Cardiovascular: Atherosclerotic calcification of the arterial vasculature, including three-vessel involvement of the coronary arteries and aortic valve. Heart size normal. No pericardial effusion. Mediastinum/Nodes: Mediastinal lymph nodes are not enlarged by CT size criteria. Hilar regions are difficult to definitively evaluate without IV contrast. No axillary adenopathy. Esophagus is grossly unremarkable. Lungs/Pleura: Centrilobular and paraseptal emphysema. Right lower lobectomy. Lungs are otherwise clear. No pleural fluid. Airway is otherwise unremarkable. Upper Abdomen: Visualized portion of the liver is unremarkable. Stones are seen in the gallbladder. Visualized portions of the adrenal glands and right kidney are unremarkable. Partially imaged low-attenuation lesion in the lower pole left kidney, similar to the appearance on 05/11/2016. Visualized portions of the spleen, pancreas, stomach and bowel are grossly unremarkable. No upper abdominal adenopathy. Musculoskeletal: Degenerative changes in the spine. No worrisome lytic or sclerotic lesions. Degenerative changes in the shoulders. IMPRESSION: 1. No evidence of metastatic disease. 2. Aortic atherosclerosis (ICD10-170.0). Three-vessel coronary artery calcification. 3.  Emphysema (ICD10-J43.9). 4. Cholelithiasis. Electronically Signed   By: Lorin Picket M.D.   On: 05/27/2018 10:27    ASSESSMENT AND PLAN:  This is a very pleasant 77 years old white male with  history of stage IA non-small cell lung cancer, adenocarcinoma status post right lower lobectomy with lymph node dissection in August 2016 and has been observation since that time. The patient is doing fine today with no concerning complaints.  Repeat CT scan of the chest performed recently showed no concerning findings for disease recurrence or progression. I discussed the scan results with the patient and his wife.  I recommended for him to continue on observation with repeat CT scan of the chest in 1 year. Regarding the anemia of chronic disease this is most likely secondary to his renal insufficiency and the patient will continue on oral  iron tablets for now. The patient also has mild thrombocytopenia and will continue to monitor this closely for now. He was advised to call immediately if he has any concerning symptoms in the interval. The patient voices understanding of current disease status and treatment options and is in agreement with the current care plan.  All questions were answered. The patient knows to call the clinic with any problems, questions or concerns. We can certainly see the patient much sooner if necessary. I spent 10 minutes counseling the patient face to face. The total time spent in the appointment was 15 minutes.  Disclaimer: This note was dictated with voice recognition software. Similar sounding words can inadvertently be transcribed and may not be corrected upon review.

## 2018-05-29 NOTE — Telephone Encounter (Signed)
Printed avs and calender of upcoming appointment. Per 10/16 los

## 2018-05-29 NOTE — Telephone Encounter (Signed)
   Call back staff: [name] Clearance letter has been faxed to the requesting surgeon outlining recommendations by Dr. Angelena Form.   . Please contact patient to make sure he knows he can hold Plavix for 1 day prior to his procedure.  . Please contact the surgeon's office to ensure the letter has been received. . This phone note will be removed from the preop pool. . Please sign encounter when completed.  Richardson Dopp, PA-C    05/29/2018 5:15 PM

## 2018-05-30 NOTE — Telephone Encounter (Signed)
Resent clearance letter.

## 2018-06-03 ENCOUNTER — Ambulatory Visit: Payer: Medicare Other

## 2018-06-04 DIAGNOSIS — C4401 Basal cell carcinoma of skin of lip: Secondary | ICD-10-CM | POA: Diagnosis not present

## 2018-06-10 ENCOUNTER — Other Ambulatory Visit: Payer: Self-pay | Admitting: Internal Medicine

## 2018-06-19 ENCOUNTER — Telehealth: Payer: Self-pay | Admitting: Internal Medicine

## 2018-06-19 MED ORDER — HYDRALAZINE HCL 25 MG PO TABS: ORAL_TABLET | ORAL | 1 refills | Status: DC

## 2018-06-19 NOTE — Telephone Encounter (Signed)
Requested medication (s) are due for refill today -yes  Requested medication (s) are on the active medication list -yes  Future visit scheduled -no  Last refill: medication is listed as historical- written 06/10/17  Notes to clinic: patient is requesting refill of historical medication- sent for provider review.  Requested Prescriptions  Pending Prescriptions Disp Refills   hydrALAZINE (APRESOLINE) 25 MG tablet  0     Cardiovascular:  Vasodilators Failed - 06/19/2018  7:51 AM      Failed - HCT in normal range and within 360 days    HCT  Date Value Ref Range Status  05/27/2018 30.6 (L) 39.0 - 52.0 % Final  05/08/2017 30.9 (L) 38.4 - 49.9 % Final         Failed - HGB in normal range and within 360 days    Hemoglobin  Date Value Ref Range Status  05/27/2018 9.6 (L) 13.0 - 17.0 g/dL Final   HGB  Date Value Ref Range Status  05/08/2017 10.0 (L) 13.0 - 17.1 g/dL Final         Failed - RBC in normal range and within 360 days    RBC  Date Value Ref Range Status  05/27/2018 3.27 (L) 4.22 - 5.81 MIL/uL Final         Failed - PLT in normal range and within 360 days    Platelets  Date Value Ref Range Status  05/27/2018 92 (L) 150 - 400 K/uL Final  05/08/2017 145 140 - 400 10e3/uL Final         Failed - Last BP in normal range    BP Readings from Last 1 Encounters:  05/29/18 (!) 146/69         Passed - WBC in normal range and within 360 days    WBC  Date Value Ref Range Status  05/27/2018 6.3 4.0 - 10.5 K/uL Final         Passed - Valid encounter within last 12 months    Recent Outpatient Visits          3 weeks ago B12 deficiency   [facility], James W, MD   2 months ago B12 deficiency   [facility] -Georges Mouse, MD   6 months ago Symptomatic anemia   [facility] -Georges Mouse, MD   6 months ago Encounter for well adult exam with abnormal findings   [facility]  -Georges Mouse, MD   1 year ago [facility] -Georges Mouse, MD              Requested Prescriptions  Pending Prescriptions Disp Refills   hydrALAZINE (APRESOLINE) 25 MG tablet  0     Cardiovascular:  Vasodilators Failed - 06/19/2018  7:51 AM      Failed - HCT in normal range and within 360 days    HCT  Date Value Ref Range Status  05/27/2018 30.6 (L) 39.0 - 52.0 % Final  05/08/2017 30.9 (L) 38.4 - 49.9 % Final         Failed - HGB in normal range and within 360 days    Hemoglobin  Date Value Ref Range Status  05/27/2018 9.6 (L) 13.0 - 17.0 g/dL Final   HGB  Date Value Ref Range Status  05/08/2017 10.0 (L) 13.0 - 17.1 g/dL Final         Failed - RBC  in normal range and within 360 days    RBC  Date Value Ref Range Status  05/27/2018 3.27 (L) 4.22 - 5.81 MIL/uL Final         Failed - PLT in normal range and within 360 days    Platelets  Date Value Ref Range Status  05/27/2018 92 (L) 150 - 400 K/uL Final  05/08/2017 145 140 - 400 10e3/uL Final         Failed - Last BP in normal range    BP Readings from Last 1 Encounters:  05/29/18 (!) 146/69         Passed - WBC in normal range and within 360 days    WBC  Date Value Ref Range Status  05/27/2018 6.3 4.0 - 10.5 K/uL Final         Passed - Valid encounter within last 12 months    Recent Outpatient Visits          3 weeks ago B12 deficiency   [facility], MD   2 months ago B12 deficiency   [place], MD   6 months ago Symptomatic anemia   [facility], MD   6 months ago Encounter for well adult exam with abnormal findings   [facility] -Junie Bame, Hunt Oris, MD   1 year ago [facility] -Junie Bame, Hunt Oris, MD

## 2018-06-19 NOTE — Telephone Encounter (Signed)
Copied from [place] [id number]. Topic: Quick Communication - See Telephone Encounter >> Jun 19, 2018  7:48 AM Conception Chancy, NT wrote: CRM for notification. See Telephone encounter for: 06/19/18.  Patient is calling and requesting a 90 day supply refill on hydrALAZINE (APRESOLINE) 25 MG tablet. Patient has 8 pills left.  [id number] [place] [place] [address] [place] [address] Phone: [phone] Fax: [phone]

## 2018-06-21 ENCOUNTER — Other Ambulatory Visit: Payer: Self-pay | Admitting: Internal Medicine

## 2018-06-21 MED ORDER — HYDRALAZINE HCL 25 MG PO TABS: ORAL_TABLET | ORAL | 1 refills | Status: DC

## 2018-06-21 NOTE — Telephone Encounter (Signed)
Copied from [place] [id number]. Topic: Quick Communication - Rx Refill/Question >> Jun 21, 2018 10:16 AM Carolyn Stare wrote: Medication  hydrALAZINE (APRESOLINE) 25 MG tablet     pt said she only has t pills RX was sent to [facility] and she said she [facility]  Has the patient contacted their pharmacy yes    Preferred Pharmacy   [facility] [address]   Agent: Please be advised that RX refills may take up to 3 business days. We ask that you follow-up with your pharmacy.

## 2018-06-21 NOTE — Telephone Encounter (Signed)
RX was sent to [facility] instead of pt's preferred pharmacy [facility] [address]  Phill [facility]ron Prescriptions  Pending Prescriptions Disp Refills  . hydrALAZINE (APRESOLINE) 25 MG tablet 270 tablet 1    Sig: TAKE 1 TABLET (25 MG TOTAL) BY MOUTH EVERY 8 (EIGHT) HOURS.     Cardiovascular:  Vasodilators Failed - 06/21/2018 10:31 AM      Failed - HCT in normal range and within 360 days    HCT  Date Value Ref Range Status  05/27/2018 30.6 (L) 39.0 - 52.0 % Final  05/08/2017 30.9 (L) 38.4 - 49.9 % Final         Failed - HGB in normal range and within 360 days    Hemoglobin  Date Value Ref Range Status  05/27/2018 9.6 (L) 13.0 - 17.0 g/dL Final   HGB  Date Value Ref Range Status  05/08/2017 10.0 (L) 13.0 - 17.1 g/dL Final         Failed - RBC in normal range and within 360 days    RBC  Date Value Ref Range Status  05/27/2018 3.27 (L) 4.22 - 5.81 MIL/uL Final         Failed - PLT in normal range and within 360 days    Platelets  Date Value Ref Range Status  05/27/2018 92 (L) 150 - 400 K/uL Final  05/08/2017 145 140 - 400 10e3/uL Final         Failed - Last BP in normal range    BP Readings from Last 1 Encounters:  05/29/18 (!) 146/69         Passed - WBC in normal range and within 360 days    WBC  Date Value Ref Range Status  05/27/2018 6.3 4.0 - 10.5 K/uL Final         Passed - Valid encounter within last 12 months    Recent Outpatient Visits          3 weeks ago B12 deficiency   [place], James W, MD   2 months ago B12 deficiency   [place], James W, MD   6 months ago Symptomatic anemia   [facility] -Georges Mouse, MD   6 months ago Encounter for well adult exam with abnormal findings   [facility] -Junie Bame, Hunt Oris, MD   1 year ago [facility] -Georges Mouse, MD

## 2018-06-24 ENCOUNTER — Telehealth: Payer: Self-pay

## 2018-06-24 MED ORDER — HYDRALAZINE HCL 25 MG PO TABS: ORAL_TABLET | ORAL | 0 refills | Status: DC

## 2018-06-24 NOTE — Telephone Encounter (Signed)
Please review previous telephone encounter.

## 2018-06-24 NOTE — Telephone Encounter (Signed)
Pt's wife calling in.  States she [facility] but it [facility]. Pt ran out of medication over the weekend, so he did not have it last night and the shipment from mail order will not be in until 11/15 at earliest.  Pt's wife would like a call to let her know what to do about this.

## 2018-06-28 ENCOUNTER — Ambulatory Visit: Payer: Medicare Other

## 2018-07-03 ENCOUNTER — Ambulatory Visit (INDEPENDENT_AMBULATORY_CARE_PROVIDER_SITE_OTHER): Payer: Medicare Other

## 2018-07-03 DIAGNOSIS — E538 Deficiency of other specified B group vitamins: Secondary | ICD-10-CM | POA: Diagnosis not present

## 2018-07-03 MED ORDER — CYANOCOBALAMIN 1000 MCG/ML IJ SOLN
1000.0000 ug | Freq: Once | INTRAMUSCULAR | Status: AC
  Administered 2018-07-03: 1000 ug via INTRAMUSCULAR

## 2018-07-03 NOTE — Progress Notes (Signed)
Medical screening examination/treatment/procedure(s) were performed by non-physician practitioner and as supervising physician I was immediately available for consultation/collaboration. I agree with above. Jaquila Santelli, MD   

## 2018-07-04 DIAGNOSIS — H26491 Other secondary cataract, right eye: Secondary | ICD-10-CM | POA: Diagnosis not present

## 2018-07-08 ENCOUNTER — Other Ambulatory Visit: Payer: Self-pay | Admitting: Internal Medicine

## 2018-07-09 ENCOUNTER — Other Ambulatory Visit: Payer: Self-pay | Admitting: Internal Medicine

## 2018-07-09 NOTE — Telephone Encounter (Signed)
Requested medication (s) are due for refill today: amount not specified  Requested medication (s) are on the active medication list: yes    Last refill: 11/26/17  Future visit scheduled yes 08/02/18 Nurse visit  Notes to clinic:historical provider  Requested Prescriptions  Pending Prescriptions Disp Refills   pramipexole (MIRAPEX) 0.5 MG tablet      Sig: Take 1 tablet (0.5 mg total) by mouth 3 (three) times daily.     Neurology:  Parkinsonian Agents Failed - 07/09/2018  1:49 PM      Failed - Last BP in normal range    BP Readings from Last 1 Encounters:  05/29/18 (!) 146/69         Passed - Valid encounter within last 12 months    Recent Outpatient Visits          1 month ago B12 deficiency   [place], James W, MD   3 months ago B12 deficiency   [place], MD   7 months ago Symptomatic anemia   [place], James W, MD   7 months ago Encounter for well adult exam with abnormal findings   [facility] -Georges Mouse, MD   1 year ago [facility] -Georges Mouse, MD

## 2018-07-09 NOTE — Telephone Encounter (Signed)
Copied from [place] [id number]. Topic: Quick Communication - Rx Refill/Question >> Jul 09, 2018  1:01 PM Scherrie Gerlach wrote: Medication: pramipexole (MIRAPEX) 1 MG tablet   Has the patient contacted their pharmacy? Yes [facility],  Wife calling to follow up on refill for this med.    She states pt cuts in half and takes TID. Pt this out of this med [id number] [place] [place] [phone] (Phone) [phone] (Fax)

## 2018-07-09 NOTE — Telephone Encounter (Signed)
Ok to ask pt why he feels he needs this referral to clarify

## 2018-07-10 MED ORDER — PRAMIPEXOLE DIHYDROCHLORIDE 0.5 MG PO TABS: 0.5000 mg | ORAL_TABLET | Freq: Three times a day (TID) | ORAL | 3 refills | Status: DC

## 2018-07-10 NOTE — Telephone Encounter (Signed)
Ok, this is done 

## 2018-07-24 ENCOUNTER — Other Ambulatory Visit: Payer: Self-pay | Admitting: Internal Medicine

## 2018-07-29 ENCOUNTER — Encounter: Payer: Self-pay | Admitting: Cardiovascular Disease

## 2018-07-29 ENCOUNTER — Ambulatory Visit (INDEPENDENT_AMBULATORY_CARE_PROVIDER_SITE_OTHER): Payer: Medicare Other | Admitting: Cardiovascular Disease

## 2018-07-29 VITALS — BP 148/70 | HR 53 | Ht 67.0 in | Wt 141.8 lb

## 2018-07-29 DIAGNOSIS — I2510 Atherosclerotic heart disease of native coronary artery without angina pectoris: Secondary | ICD-10-CM

## 2018-07-29 DIAGNOSIS — I10 Essential (primary) hypertension: Secondary | ICD-10-CM | POA: Diagnosis not present

## 2018-07-29 DIAGNOSIS — I6523 Occlusion and stenosis of bilateral carotid arteries: Secondary | ICD-10-CM | POA: Diagnosis not present

## 2018-07-29 MED ORDER — NITROGLYCERIN 0.4 MG SL SUBL: 0.4000 mg | SUBLINGUAL_TABLET | SUBLINGUAL | 3 refills | Status: DC | PRN

## 2018-07-29 NOTE — Patient Instructions (Signed)

## 2018-07-29 NOTE — Progress Notes (Signed)
Chief Complaint  Patient presents with  . Follow-up    CAD     History of Present Illness: 77 yo male with history of CAD, CVA, HTN, HLD, PAD, prostate CA, lung cancer, sinus bradycardia, anemia, CKD and dementia who is here today for cardiac follow up. Cardiac cath July 2013 with severe LAD and Circumflex disease. 2 drug eluting stents were placed in the LAD and 2 drug eluting stents were placed in the Circumflex. He has had lung cancer and prostate cancer. He has moderate carotid artery disease. Possible syncopal event October 2016. Cardiac event monitor showed rare PVCs but no other arrhythmias and no high grade AV block. Echo October 2018 with normal LV function, mild AI, mild MR. Hospitalized in April 2019 with anemia (Hgb 6.6) felt to be due to GI bleeding. Aricept dose lowered due to bradycardia.   He is here today for follow up. The patient denies any chest pain, dyspnea, palpitations, lower extremity edema, orthopnea, PND, dizziness, near syncope or syncope. He has been very active in his yard. He has been raking leaves.    Primary Care Physician: Biagio Borg, MD  Past Medical History:  Diagnosis Date  . Cancer ([place])    lung and prostate cancer per wife  . 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 CKD (chronic kidney disease), stage III ([place])   . Coronary artery disease    a. DES to LAD and Cx 02/2012.  [name] Dementia ([place])   . Dysuria 10/10/2010  . Emphysema 04/01/2012   By CT chest, august 2013  . ERECTILE DYSFUNCTION 04/05/2007  . GERD (gastroesophageal reflux disease)   . HYPERLIPIDEMIA 07/30/2008  . HYPERSOMNIA 10/02/2007  . Impaired glucose tolerance 02/09/2011  . INSOMNIA-SLEEP DISORDER-UNSPEC 10/22/2009  . LOW BACK PAIN 04/05/2007  . Mild carotid artery disease ([place])    a. 1-39% bilaterally by duplex 11/2016.  . OSTEOARTHRITIS 04/05/2007  . PEPTIC ULCER DISEASE 04/05/2007  . PERIPHERAL EDEMA 10/22/2009  . PROSTATE CANCER, HX OF 04/05/2007  . RESTLESS LEG SYNDROME 05/15/2007  .  RHINITIS, ALLERGIC NOS 05/15/2007  . Sinus bradycardia 10/24/2010   a. prior h/o HR 40s, clonidine stopped 05/2017 due to this.  [name] Unspecified visual loss 07/15/2008  . UTI 10/24/2010    Past Surgical History:  Procedure Laterality Date  . CARDIAC CATHETERIZATION    . COLONOSCOPY N/A 11/28/2017   Procedure: COLONOSCOPY;  Surgeon: Yetta Flock, MD;  Location: [facility];  Service: Gastroenterology;  Laterality: N/A;  . ESOPHAGOGASTRODUODENOSCOPY N/A 11/28/2017   Procedure: ESOPHAGOGASTRODUODENOSCOPY (EGD);  Surgeon: Yetta Flock, MD;  Location: [facility];  Service: Gastroenterology;  Laterality: N/A;  . EYE SURGERY     right  . Inguinal herniorrhapy left  2002   x 2  . Left hip replacement    . LYMPH NODE DISSECTION Right 03/22/2015   Procedure: LYMPH NODE DISSECTION;  Surgeon: Melrose Nakayama, MD;  Location: [facility];  Service: Thoracic;  Laterality: Right;  . PERCUTANEOUS CORONARY STENT INTERVENTION (PCI-S) N/A 03/08/2012   Procedure: PERCUTANEOUS CORONARY STENT INTERVENTION (PCI-S);  Surgeon: Burnell Blanks, MD;  Location: [facility];  Service: Cardiovascular;  Laterality: N/A;  . PROSTATECTOMY  2002   radical  . Right hip replacement  09/22/09  . ROTATOR CUFF REPAIR     left  . s/p ventral surgery  2009  . SEGMENTECOMY Right 03/22/2015   Procedure: RIGHT LOWER LOBE SUPERIOR SEGMENTECTOMY;  Surgeon: Melrose Nakayama, MD;  Location: [facility];  Service: Thoracic;  Laterality:  Right;  [name] VIDEO ASSISTED THORACOSCOPY Right 03/22/2015   Procedure: RIGHT VIDEO ASSISTED THORACOSCOPY;  Surgeon: Melrose Nakayama, MD;  Location: [facility];  Service: Thoracic;  Laterality: Right;    Current Outpatient Medications  Medication Sig Dispense Refill  . abiraterone acetate (ZYTIGA) 500 MG tablet Take 1,000 mg by mouth daily. Take on an empty stomach 1 hour before or 2 hours after a meal.    . amLODipine (NORVASC) 10 MG tablet TAKE 1 TABLET BY MOUTH  DAILY 90 tablet 1  .  Ascorbic Acid (VITAMIN C) 500 MG CAPS Take 500 mg by mouth daily.     [name] aspirin 81 MG chewable tablet Chew 1 tablet (81 mg total) by mouth daily. Start from 4/22 30 tablet 0  . citalopram (CELEXA) 20 MG tablet Take 1 tablet (20 mg total) by mouth daily. 90 tablet 3  . clopidogrel (PLAVIX) 75 MG tablet TAKE 1 TABLET BY MOUTH  DAILY 90 tablet 1  . donepezil (ARICEPT) 5 MG tablet TAKE 1 TABLET BY MOUTH AT  BEDTIME 90 tablet 1  . ferrous sulfate 325 (65 FE) MG tablet Take 325 mg by mouth 3 (three) times daily.  1  . gabapentin (NEURONTIN) 300 MG capsule TAKE 1 CAPSULE BY MOUTH TWICE A DAY 180 capsule 0  . hydrALAZINE (APRESOLINE) 25 MG tablet TAKE 1 TABLET (25 MG TOTAL) BY MOUTH EVERY 8 (EIGHT) HOURS. 90 tablet 0  . isosorbide mononitrate (IMDUR) 30 MG 24 hr tablet TAKE 1 TABLET BY MOUTH  DAILY 90 tablet 1  . lovastatin (MEVACOR) 40 MG tablet TAKE 1 TABLET BY MOUTH AT  BEDTIME 90 tablet 1  . Magnesium 500 MG TABS Take 500 mg by mouth.    . meclizine (ANTIVERT) 25 MG tablet 12.5-25 mg as needed for dizziness. TAKE 1/2 - 1 TABLET BY MOUTH THREE TIMES A DAY AS NEEDED FOR DIZINESS     . nitroGLYCERIN (NITROSTAT) 0.4 MG SL tablet Place 1 tablet (0.4 mg total) under the tongue every 5 (five) minutes as needed for chest pain. 25 tablet 3  . pantoprazole (PROTONIX) 40 MG tablet TAKE 1 TABLET BY MOUTH EVERY DAY 90 tablet 1  . pramipexole (MIRAPEX) 0.5 MG tablet Take 1 tablet (0.5 mg total) by mouth 3 (three) times daily. 270 tablet 3  . predniSONE (DELTASONE) 5 MG tablet Take 5 mg daily with breakfast by mouth.    . thiamine (VITAMIN B-1) 50 MG tablet Take 1 tablet (50 mg total) by mouth daily. (Patient taking differently: Take 100 mg by mouth daily. ) 30 tablet 0   No current facility-administered medications for this visit.     Allergies  Allergen Reactions  . Amitriptyline Hcl Other (See Comments)    REACTION: confusion  . Diphenhydramine Hcl Other (See Comments)    Keeps him awake  . Simvastatin  Other (See Comments)    REACTION: leg pain  . Clonidine Derivatives     Dizziness, falls, bradycardia  . Tylenol [Acetaminophen] Other (See Comments)    Dr advised pt not to take due to finding a spot on pt's kidney    Social History   Socioeconomic History  . Marital status: Married    Spouse name: Not on file  . Number of children: 2  . Years of education: Not on file  . Highest education level: Not on file  Occupational History  . Occupation: truck driver-retired  Social Needs  . Financial resource strain: Somewhat hard  . Food insecurity:  Worry: Sometimes true    Inability: Sometimes true  . Transportation needs:    Medical: No    Non-medical: No  Tobacco Use  . Smoking status: Former Smoker    Packs/day: 1.00    Years: 15.00    Pack years: 15.00    Types: Cigarettes    Last attempt to quit: 02/05/1977    Years since quitting: 41.5  . Smokeless tobacco: Never Used  Substance and Sexual Activity  . Alcohol use: No    Alcohol/week: 1.0 standard drinks    Types: 1 Standard drinks or equivalent per week    Comment: RARE  . Drug use: No  . Sexual activity: Not Currently  Lifestyle  . Physical activity:    Days per week: 0 days    Minutes per session: 0 min  . Stress: Not at all  Relationships  . Social connections:    Talks on phone: More than three times a week    Gets together: More than three times a week    Attends religious service: Never    Active member of club or organization: No    Attends meetings of clubs or organizations: Never    Relationship status: Married  . Intimate partner violence:    Fear of current or ex partner: No    Emotionally abused: No    Physically abused: No    Forced sexual activity: No  Other Topics Concern  . Not on file  Social History Narrative  . Not on file    Family History  Problem Relation Age of Onset  . Heart attack Father 78       Smoker  . Heart attack Brother 38  . Lung cancer Sister   . Colon cancer  Neg Hx     Review of Systems:  As stated in the HPI and otherwise negative.   BP (!) 148/70   Pulse (!) 53   Ht 5\' 7"  (1.702 m)   Wt 141 lb 12.8 oz (64.3 kg)   SpO2 98%   BMI 22.21 kg/m   Physical Exam:   General: Well developed, well nourished, NAD  HEENT: OP clear, mucus membranes moist  SKIN: warm, dry. No rashes. Neuro: No focal deficits  Musculoskeletal: Muscle strength 5/5 all ext  Psychiatric: Mood and affect normal  Neck: No JVD, no carotid bruits, no thyromegaly, no lymphadenopathy.  Lungs:Clear bilaterally, no wheezes, rhonci, crackles Cardiovascular: Regular rate and rhythm. No murmurs, gallops or rubs. Abdomen:Soft. Bowel sounds present. Non-tender.  Extremities: No lower extremity edema. Pulses are 2 + in the bilateral DP/PT.  Echo October 2018: - Left ventricle: The cavity size was normal. Wall thickness was   increased in a pattern of mild LVH. Systolic function was normal.   The estimated ejection fraction was in the range of 60% to 65%.   Wall motion was normal; there were no regional wall motion   abnormalities. - Aortic valve: There was mild regurgitation. - Mitral valve: Calcified annulus. There was mild regurgitation.  EKG:  EKG is not ordered today. The ekg ordered today demonstrates   Recent Labs: 04/02/2018: TSH 2.51 05/27/2018: ALT 9; BUN 20; Creatinine, Ser 1.90; Hemoglobin 9.6; Platelets 92; Potassium 3.6; Sodium 142   Lipid Panel    Component Value Date/Time   CHOL 88 04/02/2018 0854   TRIG 79.0 04/02/2018 0854   HDL 33.90 (L) 04/02/2018 0854   CHOLHDL 3 04/02/2018 0854   VLDL 15.8 04/02/2018 0854   LDLCALC 38 04/02/2018 0854  LDLDIRECT 104.9 02/10/2011 1224     Wt Readings from Last 3 Encounters:  07/29/18 141 lb 12.8 oz (64.3 kg)  05/29/18 141 lb 12.8 oz (64.3 kg)  05/28/18 141 lb (64 kg)     Other studies Reviewed: Additional studies/ records that were reviewed today include: . Review of the above records demonstrates:    Assessment and Plan:   1. CAD without angina: No chest pain. Will continue ASA, Plavix, statin and Imdur. He is not on a beta blocker due to bradycardia.    2. HTN: BP is controlled at home. No changes  3. HLD: LDL at goal. Continue statin.   4. Carotid artery disease: Stable bilateral mild to moderate carotid disease by dopplers June 2019.     Current medicines are reviewed at length with the patient today.  The patient does not have concerns regarding medicines.  The following changes have been made:  no change  Labs/ tests ordered today include:  No orders of the defined types were placed in this encounter.  Disposition:   FU with me in 12 months   Signed, Lauree Chandler, MD 07/29/2018 10:15 AM    [address], [place], [place]  [address] Phone: [phone]; Fax: [phone]

## 2018-08-01 ENCOUNTER — Ambulatory Visit (INDEPENDENT_AMBULATORY_CARE_PROVIDER_SITE_OTHER): Payer: Medicare Other | Admitting: Emergency Medicine

## 2018-08-01 DIAGNOSIS — E538 Deficiency of other specified B group vitamins: Secondary | ICD-10-CM

## 2018-08-01 MED ORDER — CYANOCOBALAMIN 1000 MCG/ML IJ SOLN
1000.0000 ug | Freq: Once | INTRAMUSCULAR | Status: AC
  Administered 2018-08-01: 1000 ug via INTRAMUSCULAR

## 2018-08-01 NOTE — Progress Notes (Signed)
Medical screening examination/treatment/procedure(s) were performed by non-physician practitioner and as supervising physician I was immediately available for consultation/collaboration. I agree with above. James John, MD   

## 2018-08-02 ENCOUNTER — Ambulatory Visit: Payer: Medicare Other

## 2018-09-02 ENCOUNTER — Ambulatory Visit (INDEPENDENT_AMBULATORY_CARE_PROVIDER_SITE_OTHER): Payer: Medicare Other

## 2018-09-02 DIAGNOSIS — E538 Deficiency of other specified B group vitamins: Secondary | ICD-10-CM | POA: Diagnosis not present

## 2018-09-02 MED ORDER — CYANOCOBALAMIN 1000 MCG/ML IJ SOLN
1000.0000 ug | Freq: Once | INTRAMUSCULAR | Status: AC
  Administered 2018-09-02: 1000 ug via INTRAMUSCULAR

## 2018-09-02 NOTE — Progress Notes (Signed)
Medical screening examination/treatment/procedure(s) were performed by non-physician practitioner and as supervising physician I was immediately available for consultation/collaboration. I agree with above. James John, MD   

## 2018-09-11 ENCOUNTER — Ambulatory Visit: Payer: Self-pay | Admitting: Podiatry

## 2018-09-24 DIAGNOSIS — N183 Chronic kidney disease, stage 3 (moderate): Secondary | ICD-10-CM | POA: Diagnosis not present

## 2018-09-27 ENCOUNTER — Other Ambulatory Visit: Payer: Self-pay | Admitting: Internal Medicine

## 2018-10-03 ENCOUNTER — Ambulatory Visit (INDEPENDENT_AMBULATORY_CARE_PROVIDER_SITE_OTHER): Payer: Medicare Other

## 2018-10-03 DIAGNOSIS — E538 Deficiency of other specified B group vitamins: Secondary | ICD-10-CM | POA: Diagnosis not present

## 2018-10-03 MED ORDER — CYANOCOBALAMIN 1000 MCG/ML IJ SOLN
1000.0000 ug | Freq: Once | INTRAMUSCULAR | Status: AC
  Administered 2018-10-03: 1000 ug via INTRAMUSCULAR

## 2018-10-03 NOTE — Progress Notes (Signed)
Medical screening examination/treatment/procedure(s) were performed by non-physician practitioner and as supervising physician I was immediately available for consultation/collaboration. I agree with above. Shamel Galyean, MD   

## 2018-10-07 ENCOUNTER — Ambulatory Visit: Payer: Self-pay | Admitting: Podiatry

## 2018-11-01 ENCOUNTER — Ambulatory Visit: Payer: Medicare Other

## 2018-11-22 ENCOUNTER — Telehealth: Payer: Self-pay | Admitting: Internal Medicine

## 2018-11-22 NOTE — Telephone Encounter (Signed)
Copied from [place] [id number]. Topic: Quick Communication - Rx Refill/Question >> Nov 22, 2018  8:55 AM Burchel, Abbi R wrote: Medication: hydrALAZINE (APRESOLINE) 25 MG tablet,   [id number] [place] [place]  [phone] (Phone) [phone] (Fax)    Pt was  advised that RX refills may take up to 3 business days. We ask that you follow-up with your pharmacy.

## 2018-11-25 MED ORDER — HYDRALAZINE HCL 25 MG PO TABS: ORAL_TABLET | ORAL | 0 refills | Status: DC

## 2018-11-25 NOTE — Addendum Note (Signed)
Addended by: Earnstine Regal on: 11/25/2018 12:36 PM   Modules accepted: Orders

## 2018-11-25 NOTE — Telephone Encounter (Signed)
Reviewed chart pt is due for follow-up appt. Approve x 1 need ov for future reiflls.Marland KitchenJohny Davis

## 2018-12-06 ENCOUNTER — Telehealth: Payer: Self-pay | Admitting: Internal Medicine

## 2018-12-06 MED ORDER — DONEPEZIL HCL 5 MG PO TABS: 5.0000 mg | ORAL_TABLET | Freq: Every day | ORAL | 0 refills | Status: DC

## 2018-12-06 MED ORDER — AMLODIPINE BESYLATE 10 MG PO TABS: 10.0000 mg | ORAL_TABLET | Freq: Every day | ORAL | 0 refills | Status: DC

## 2018-12-06 MED ORDER — ISOSORBIDE MONONITRATE ER 30 MG PO TB24: 30.0000 mg | ORAL_TABLET | Freq: Every day | ORAL | 0 refills | Status: DC

## 2018-12-06 MED ORDER — CLOPIDOGREL BISULFATE 75 MG PO TABS: 75.0000 mg | ORAL_TABLET | Freq: Every day | ORAL | 0 refills | Status: DC

## 2018-12-06 MED ORDER — GABAPENTIN 300 MG PO CAPS: 300.0000 mg | ORAL_CAPSULE | Freq: Two times a day (BID) | ORAL | 0 refills | Status: DC

## 2018-12-06 NOTE — Telephone Encounter (Signed)
Copied from [place] [phone]. Topic: Quick Communication - Rx Refill/Question >> Dec 06, 2018  1:44 PM Selinda Flavin B, NT wrote: Medication: clopidogrel (PLAVIX) 75 MG tablet gabapentin (NEURONTIN) 300 MG capsule amLODipine (NORVASC) 10 MG tablet  isosorbide mononitrate (IMDUR) 30 MG 24 hr tablet  donepezil (ARICEPT) 5 MG tablet  Has the patient contacted their pharmacy? yes (Agent: If no, request that the patient contact the pharmacy for the refill.) (Agent: If yes, when and what did the pharmacy advise?)  Preferred Pharmacy (with phone number or street name): [id number] [place], [place]: Please be advised that RX refills may take up to 3 business days. We ask that you follow-up with your pharmacy.

## 2018-12-06 NOTE — Telephone Encounter (Signed)
Please follow up with to schedule b12

## 2018-12-09 NOTE — Telephone Encounter (Signed)
Patient will be called to reschedule b12 injections when we resume normal office visits

## 2018-12-16 ENCOUNTER — Other Ambulatory Visit (HOSPITAL_COMMUNITY): Payer: Self-pay | Admitting: Cardiovascular Disease

## 2018-12-16 DIAGNOSIS — I6523 Occlusion and stenosis of bilateral carotid arteries: Secondary | ICD-10-CM

## 2018-12-19 ENCOUNTER — Telehealth: Payer: Self-pay

## 2018-12-19 NOTE — Telephone Encounter (Signed)
Routing to dr john----patient's last office visit with dr Jenny Reichmann was 05/28/18---advised patient to come back for 6 month follow up---patient is also due b12 injection---please advise if appropriate for patient to have "in person visit" with you at this time, or should we just do b12 injection in parking lot?  Please advise, I will call patient, thanks

## 2018-12-19 NOTE — Telephone Encounter (Signed)
[place] for [employer] visit since we are now seeing one in person patient per hour per doctor

## 2018-12-20 NOTE — Telephone Encounter (Signed)
Routing to tammy/sched---can we please see if patient will come into office for 6 month follow up---per dr Jenny Reichmann, ok to come in, patient also needs to get b12 injection---please schedule according to one in person office visit/per hour for guidelines we are following starting next week, thanks

## 2018-12-20 NOTE — Telephone Encounter (Signed)
Patient scheduled.

## 2018-12-24 ENCOUNTER — Ambulatory Visit (INDEPENDENT_AMBULATORY_CARE_PROVIDER_SITE_OTHER): Payer: Medicare Other | Admitting: Internal Medicine

## 2018-12-24 ENCOUNTER — Other Ambulatory Visit (INDEPENDENT_AMBULATORY_CARE_PROVIDER_SITE_OTHER): Payer: Medicare Other

## 2018-12-24 ENCOUNTER — Other Ambulatory Visit: Payer: Self-pay

## 2018-12-24 ENCOUNTER — Ambulatory Visit: Admission: RE | Admit: 2018-12-24 | Payer: Medicare Other | Source: Ambulatory Visit

## 2018-12-24 ENCOUNTER — Encounter: Payer: Self-pay | Admitting: Internal Medicine

## 2018-12-24 ENCOUNTER — Telehealth: Payer: Self-pay

## 2018-12-24 VITALS — BP 122/72 | HR 63 | Temp 97.6°F | Ht 67.0 in | Wt 137.0 lb

## 2018-12-24 DIAGNOSIS — E538 Deficiency of other specified B group vitamins: Secondary | ICD-10-CM

## 2018-12-24 DIAGNOSIS — R29818 Other symptoms and signs involving the nervous system: Secondary | ICD-10-CM

## 2018-12-24 DIAGNOSIS — R7302 Impaired glucose tolerance (oral): Secondary | ICD-10-CM

## 2018-12-24 DIAGNOSIS — Z0001 Encounter for general adult medical examination with abnormal findings: Secondary | ICD-10-CM | POA: Diagnosis not present

## 2018-12-24 DIAGNOSIS — R269 Unspecified abnormalities of gait and mobility: Secondary | ICD-10-CM

## 2018-12-24 DIAGNOSIS — Z0000 Encounter for general adult medical examination without abnormal findings: Secondary | ICD-10-CM

## 2018-12-24 DIAGNOSIS — D518 Other vitamin B12 deficiency anemias: Secondary | ICD-10-CM

## 2018-12-24 DIAGNOSIS — E0590 Thyrotoxicosis, unspecified without thyrotoxic crisis or storm: Secondary | ICD-10-CM | POA: Diagnosis not present

## 2018-12-24 DIAGNOSIS — N183 Chronic kidney disease, stage 3 unspecified: Secondary | ICD-10-CM

## 2018-12-24 DIAGNOSIS — R634 Abnormal weight loss: Secondary | ICD-10-CM | POA: Insufficient documentation

## 2018-12-24 DIAGNOSIS — F0390 Unspecified dementia without behavioral disturbance: Secondary | ICD-10-CM

## 2018-12-24 DIAGNOSIS — H9193 Unspecified hearing loss, bilateral: Secondary | ICD-10-CM

## 2018-12-24 DIAGNOSIS — E559 Vitamin D deficiency, unspecified: Secondary | ICD-10-CM | POA: Diagnosis not present

## 2018-12-24 LAB — IBC PANEL
Iron: 48 ug/dL (ref 42–165)
Saturation Ratios: 9.5 % — ABNORMAL LOW (ref 20.0–50.0)
Transferrin: 360 mg/dL (ref 212.0–360.0)

## 2018-12-24 LAB — VITAMIN D 25 HYDROXY (VIT D DEFICIENCY, FRACTURES): VITD: 87.03 ng/mL (ref 30.00–100.00)

## 2018-12-24 LAB — BASIC METABOLIC PANEL
BUN: 36 mg/dL — ABNORMAL HIGH (ref 6–23)
CO2: 26 mEq/L (ref 19–32)
Calcium: 8.8 mg/dL (ref 8.4–10.5)
Chloride: 103 mEq/L (ref 96–112)
Creatinine, Ser: 2.08 mg/dL — ABNORMAL HIGH (ref 0.40–1.50)
GFR: 30.93 mL/min — ABNORMAL LOW (ref >60.00)
Glucose, Bld: 113 mg/dL — ABNORMAL HIGH (ref 70–99)
Potassium: 4 mEq/L (ref 3.5–5.1)
Sodium: 138 mEq/L (ref 135–145)

## 2018-12-24 LAB — CBC WITH DIFFERENTIAL/PLATELET
Basophils Absolute: 0 10*3/uL (ref 0.0–0.1)
Basophils Relative: 0.4 % (ref 0.0–3.0)
Eosinophils Absolute: 0.2 10*3/uL (ref 0.0–0.7)
Eosinophils Relative: 2 % (ref 0.0–5.0)
HCT: 25.7 % — ABNORMAL LOW (ref 39.0–52.0)
Hemoglobin: 8.7 g/dL — ABNORMAL LOW (ref 13.0–17.0)
Lymphocytes Relative: 21.6 % (ref 12.0–46.0)
Lymphs Abs: 1.7 10*3/uL (ref 0.7–4.0)
MCHC: 33.8 g/dL (ref 30.0–36.0)
MCV: 89.7 fl (ref 78.0–100.0)
Monocytes Absolute: 0.9 10*3/uL (ref 0.1–1.0)
Monocytes Relative: 11.3 % (ref 3.0–12.0)
Neutro Abs: 5 10*3/uL (ref 1.4–7.7)
Neutrophils Relative %: 64.7 % (ref 43.0–77.0)
Platelets: 154 10*3/uL (ref 150.0–400.0)
RBC: 2.87 Mil/uL — ABNORMAL LOW (ref 4.22–5.81)
RDW: 13.6 % (ref 11.5–15.5)
WBC: 7.8 10*3/uL (ref 4.0–10.5)

## 2018-12-24 LAB — LIPID PANEL
Cholesterol: 78 mg/dL (ref 0–200)
HDL: 29.9 mg/dL — ABNORMAL LOW (ref >39.00)
LDL Cholesterol: 28 mg/dL (ref 0–99)
NonHDL: 48.15
Total CHOL/HDL Ratio: 3
Triglycerides: 99 mg/dL (ref 0.0–149.0)
VLDL: 19.8 mg/dL (ref 0.0–40.0)

## 2018-12-24 LAB — HEPATIC FUNCTION PANEL
ALT: 5 U/L (ref 0–53)
AST: 12 U/L (ref 0–37)
Albumin: 3.8 g/dL (ref 3.5–5.2)
Alkaline Phosphatase: 75 U/L (ref 39–117)
Bilirubin, Direct: 0.2 mg/dL (ref 0.0–0.3)
Total Bilirubin: 0.8 mg/dL (ref 0.2–1.2)
Total Protein: 6 g/dL (ref 6.0–8.3)

## 2018-12-24 LAB — VITAMIN B12: Vitamin B-12: >1515 pg/mL — ABNORMAL HIGH (ref 211–911)

## 2018-12-24 LAB — PSA: PSA: 45.16 ng/mL — ABNORMAL HIGH (ref 0.10–4.00)

## 2018-12-24 LAB — HEMOGLOBIN A1C: Hgb A1c MFr Bld: 5.2 % (ref 4.6–6.5)

## 2018-12-24 LAB — TSH: TSH: 4.47 u[IU]/mL (ref 0.35–4.50)

## 2018-12-24 MED ORDER — CYANOCOBALAMIN 1000 MCG/ML IJ SOLN
1000.0000 ug | Freq: Once | INTRAMUSCULAR | Status: AC
  Administered 2018-12-24: 1000 ug via INTRAMUSCULAR

## 2018-12-24 NOTE — Patient Outreach (Signed)
[facility]) Care Management  12/24/2018  Victor Davis Feb 10, 1941 [id number]   Telephone Screen  Referral Date: 12/24/2018 Referral Source: MD Office Referral Reason: " dementia, kidney failure, recurrent falls, gait disorder" Insurance:[employer] Medicare   Outreach attempt # 1 to patient. Spoke with spouse-DPR in file. Spouse requested call be completed with her as she handles patient's medical affairs due to his memory issues. RN CM reviewed and discussed referral source and reason with spouse. Spouse voices that her main concern and need is for HHPT. She states that patient  "needs to have legs stronger." She is wanting patient to receive in home physical therapy. She voices that she has some "weights" in the home that she wants patient to use to help "strengthen his muscles all over." Advised spouse that [facility] does not provide HHPT. Spouse made aware of what [facility] can and not do. Also, informed spouse that due to COVID-19 no home visits are currently being done. Spouse voiced understanding. She does not feel like she needs [facility] assistance at this time. She reports her main goal is focusing on getting her spouse to walk better and get stronger so he won't fall. She reports that he fell earlier this week. Spouse states that patient has cane and walker in the home but refuses to use both of them. She reports that during MD visit today-MD was ordering referral to neurology to further evaluate his mental state as well as ENT for hearing issues. She states that patient continues to drive short distances accompanied by her or either she drives for him. RN CM discussed safety measures with patient driving and having memory loss and other cognitive issues. Spouse voices that she does not allow him to drive alone.  She is managing patient's meds. She reports that he takes about 10 meds total. She denies any issues affording and/or managing his meds at this time. She states that she has applied for  MOWs and is on the [facility] waiting list. They currently get food assistance through local food bank. She denies needing any SW assistance for psychosocial issues/needs at this time. She states that patient drinks Ensures as well. Advised spouse that RN CM could send out some [employer] coupons to assist with cost and she was agreeable to this. She is agreeable to RN CM mailing out [facility] contact info for future reference in case needs arise.     Plan: RN CM contacted MD office. Spoke with [place] and message to be relayed to nurse/MD that [facility] does not provide HHPT services and requested that [facility] referral be placed for patient.  RN CM will send Ensure coupons per spouse request via mail.   Enzo Montgomery, RN,BSN,CCM [facility] Telephonic Care Management Coordinator Direct Phone: [phone] Toll Free: [phone] Fax: [phone]

## 2018-12-24 NOTE — Progress Notes (Signed)
Subjective:    Patient ID: Victor Davis, male    DOB: Dec 03, 1940, 78 y.o.   MRN: [id number]  HPI Here for wellness and f/u worsening dementia;  Pt denies Chest pain, worsening SOB, DOE, wheezing, orthopnea, PND, worsening LE edema, palpitations, dizziness or syncope.  Pt denies neurological change such as new headache, facial or extremity weakness.  Pt denies polydipsia, polyuria, or low sugar symptoms. Pt states overall good compliance with treatment and medications, good tolerability, and has been trying to follow appropriate diet.  Pt denies worsening depressive symptoms, suicidal ideation or panic. No fever, night sweats, wt loss, loss of appetite, or other constitutional symptoms.  Pt states fair ability only with ADL's, home safety reviewed and adequate, no other significant changes in hearing or vision, and only occasionally active with exercise. Dementia overall worse symptomatically for at least 6 mo per wife, with gradual, but not assoc with behavioral changes such as hallucinations, paranoia, or agitation.  Due for B12 f/u today.  Has had several falls recently with generalized weakness worsening, gait overall getting worse.  Also with worsening hearing bilateral for > 6 mo.  Also with recent wt loss for unclear reasons, just seems to be eating less. Denies worsening reflux, abd pain, dysphagia, n/v, bowel change or blood. Past Medical History:  Diagnosis Date  . Cancer ([place])    lung and prostate cancer per wife  . 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 CKD (chronic kidney disease), stage III ([place])   . Coronary artery disease    a. DES to LAD and Cx 02/2012.  [name] Dementia ([place])   . Dysuria 10/10/2010  . Emphysema 04/01/2012   By CT chest, august 2013  . ERECTILE DYSFUNCTION 04/05/2007  . GERD (gastroesophageal reflux disease)   . HYPERLIPIDEMIA 07/30/2008  . HYPERSOMNIA 10/02/2007  . Impaired glucose tolerance 02/09/2011  . INSOMNIA-SLEEP DISORDER-UNSPEC 10/22/2009  . LOW BACK PAIN  04/05/2007  . Mild carotid artery disease ([place])    a. 1-39% bilaterally by duplex 11/2016.  . OSTEOARTHRITIS 04/05/2007  . PEPTIC ULCER DISEASE 04/05/2007  . PERIPHERAL EDEMA 10/22/2009  . PROSTATE CANCER, HX OF 04/05/2007  . RESTLESS LEG SYNDROME 05/15/2007  . RHINITIS, ALLERGIC NOS 05/15/2007  . Sinus bradycardia 10/24/2010   a. prior h/o HR 40s, clonidine stopped 05/2017 due to this.  [name] Unspecified visual loss 07/15/2008  . UTI 10/24/2010   Past Surgical History:  Procedure Laterality Date  . CARDIAC CATHETERIZATION    . COLONOSCOPY N/A 11/28/2017   Procedure: COLONOSCOPY;  Surgeon: Yetta Flock, MD;  Location: [facility];  Service: Gastroenterology;  Laterality: N/A;  . ESOPHAGOGASTRODUODENOSCOPY N/A 11/28/2017   Procedure: ESOPHAGOGASTRODUODENOSCOPY (EGD);  Surgeon: Yetta Flock, MD;  Location: [facility];  Service: Gastroenterology;  Laterality: N/A;  . EYE SURGERY     right  . Inguinal herniorrhapy left  2002   x 2  . Left hip replacement    . LYMPH NODE DISSECTION Right 03/22/2015   Procedure: LYMPH NODE DISSECTION;  Surgeon: Melrose Nakayama, MD;  Location: [facility];  Service: Thoracic;  Laterality: Right;  . PERCUTANEOUS CORONARY STENT INTERVENTION (PCI-S) N/A 03/08/2012   Procedure: PERCUTANEOUS CORONARY STENT INTERVENTION (PCI-S);  Surgeon: Burnell Blanks, MD;  Location: [facility] CATH LAB;  Service: Cardiovascular;  Laterality: N/A;  . PROSTATECTOMY  2002   radical  . Right hip replacement  09/22/09  . ROTATOR CUFF REPAIR     left  . s/p ventral surgery  2009  . SEGMENTECOMY Right 03/22/2015  Procedure: RIGHT LOWER LOBE SUPERIOR SEGMENTECTOMY;  Surgeon: Melrose Nakayama, MD;  Location: [facility];  Service: Thoracic;  Laterality: Right;  [name] VIDEO ASSISTED THORACOSCOPY Right 03/22/2015   Procedure: RIGHT VIDEO ASSISTED THORACOSCOPY;  Surgeon: Melrose Nakayama, MD;  Location: [facility];  Service: Thoracic;  Laterality: Right;    reports that he quit smoking about  41 years ago. His smoking use included cigarettes. He has a 15.00 pack-year smoking history. He has never used smokeless tobacco. He reports that he does not drink alcohol or use drugs. family history includes Heart attack (age of onset: 81) in his father; Heart attack (age of onset: 58) in his brother; Lung cancer in his sister. Allergies  Allergen Reactions  . Amitriptyline Hcl Other (See Comments)    REACTION: confusion  . Diphenhydramine Hcl Other (See Comments)    Keeps him awake  . Simvastatin Other (See Comments)    REACTION: leg pain  . Clonidine Derivatives     Dizziness, falls, bradycardia  . Tylenol [Acetaminophen] Other (See Comments)    Dr advised pt not to take due to finding a spot on pt's kidney   Current Outpatient Medications on File Prior to Visit  Medication Sig Dispense Refill  . abiraterone acetate (ZYTIGA) 500 MG tablet Take 1,000 mg by mouth daily. Take on an empty stomach 1 hour before or 2 hours after a meal.    . amLODipine (NORVASC) 10 MG tablet Take 1 tablet (10 mg total) by mouth daily. 90 tablet 0  . Ascorbic Acid (VITAMIN C) 500 MG CAPS Take 500 mg by mouth daily.     [name] aspirin 81 MG chewable tablet Chew 1 tablet (81 mg total) by mouth daily. Start from 4/22 30 tablet 0  . citalopram (CELEXA) 20 MG tablet Take 1 tablet (20 mg total) by mouth daily. 90 tablet 3  . clopidogrel (PLAVIX) 75 MG tablet Take 1 tablet (75 mg total) by mouth daily. 90 tablet 0  . donepezil (ARICEPT) 5 MG tablet Take 1 tablet (5 mg total) by mouth at bedtime. -- Office visit needed for further refills 90 tablet 0  . ferrous sulfate 325 (65 FE) MG tablet Take 325 mg by mouth 3 (three) times daily.  1  . gabapentin (NEURONTIN) 300 MG capsule Take 1 capsule (300 mg total) by mouth 2 (two) times daily. 180 capsule 0  . hydrALAZINE (APRESOLINE) 25 MG tablet TAKE 1 TABLET (25 MG TOTAL) BY MOUTH EVERY 8 (EIGHT) HOURS. 90 tablet 0  . isosorbide mononitrate (IMDUR) 30 MG 24 hr tablet Take 1  tablet (30 mg total) by mouth daily. -- Office visit needed for further refills 90 tablet 0  . lovastatin (MEVACOR) 40 MG tablet Take 1 tablet (40 mg total) by mouth at bedtime. -- Office visit needed for further refills 90 tablet 0  . Magnesium 500 MG TABS Take 500 mg by mouth.    . meclizine (ANTIVERT) 25 MG tablet 12.5-25 mg as needed for dizziness. TAKE 1/2 - 1 TABLET BY MOUTH THREE TIMES A DAY AS NEEDED FOR DIZINESS     . nitroGLYCERIN (NITROSTAT) 0.4 MG SL tablet Place 1 tablet (0.4 mg total) under the tongue every 5 (five) minutes as needed for chest pain. 25 tablet 3  . pantoprazole (PROTONIX) 40 MG tablet TAKE 1 TABLET BY MOUTH EVERY DAY 90 tablet 1  . pramipexole (MIRAPEX) 0.5 MG tablet Take 1 tablet (0.5 mg total) by mouth 3 (three) times daily. 270 tablet 3  .  predniSONE (DELTASONE) 5 MG tablet Take 5 mg daily with breakfast by mouth.    . thiamine (VITAMIN B-1) 50 MG tablet Take 1 tablet (50 mg total) by mouth daily. (Patient taking differently: Take 100 mg by mouth daily. ) 30 tablet 0   No current facility-administered medications on file prior to visit.    Review of Systems Unable due to dementia Wife states all other system neg    Objective:   Physical Exam BP 122/72   Pulse 63   Temp 97.6 F (36.4 C) (Oral)   Ht 5\' 7"  (1.702 m)   Wt 137 lb (62.1 kg)   SpO2 99%   BMI 21.46 kg/m  VS noted,  Constitutional: Pt is oriented to person, place, and time. Appears well-developed and well-nourished, in no significant distress and comfortable Head: Normocephalic and atraumatic  Eyes: Conjunctivae and EOM are normal. Pupils are equal, round, and reactive to light Right Ear: External ear normal without discharge Left Ear: External ear normal without discharge Nose: Nose without discharge or deformity Mouth/Throat: Oropharynx is without other ulcerations and moist  Neck: Normal range of motion. Neck supple. No JVD present. No tracheal deviation present or significant neck LA or  mass Cardiovascular: Normal rate, regular rhythm, normal heart sounds and intact distal pulses.   Pulmonary/Chest: WOB normal and breath sounds without rales or wheezing  Abdominal: Soft. Bowel sounds are normal. NT. No HSM  Musculoskeletal: Normal range of motion. Exhibits no edema Lymphadenopathy: Has no other cervical adenopathy.  Neurological: Pt is alert and oriented to person, place, and time. Pt has normal reflexes. No cranial nerve deficit. Motor grossly intact, Gait unsteady and higher risk of fall Skin: Skin is warm and dry. No rash noted or new ulcerations Psychiatric:  Has normal mood and affect. Behavior is normal without agitation No other exam findings Lab Results  Component Value Date   WBC 7.8 12/24/2018   HGB 8.7 Repeated and verified X2. (L) 12/24/2018   HCT 25.7 Repeated and verified X2. (L) 12/24/2018   PLT 154.0 12/24/2018   GLUCOSE 113 (H) 12/24/2018   CHOL 78 12/24/2018   TRIG 99.0 12/24/2018   HDL 29.90 (L) 12/24/2018   LDLDIRECT 104.9 02/10/2011   LDLCALC 28 12/24/2018   ALT 5 12/24/2018   AST 12 12/24/2018   NA 138 12/24/2018   K 4.0 12/24/2018   CL 103 12/24/2018   CREATININE 2.08 (H) 12/24/2018   BUN 36 (H) 12/24/2018   CO2 26 12/24/2018   TSH 4.47 12/24/2018   PSA 45.16 (H) 12/24/2018   INR 0.99 11/27/2017   HGBA1C 5.2 12/24/2018   MICROALBUR 3.6 (H) 02/01/2010      Assessment & Plan:

## 2018-12-24 NOTE — Telephone Encounter (Signed)
Copied from [place] [id number]. Topic: Referral - Status >> Dec 24, 2018  1:34 PM Yvette Rack wrote: Reason for CRM: Roshanda with [facility] stated the referral needs to be sent to a [facility] provider because they do not offer [facility]. Cb# [phone]

## 2018-12-24 NOTE — Patient Instructions (Addendum)
You had the B12 shot today  Please continue all other medications as before, and refills have been done if requested.  Please have the pharmacy call with any other refills you may need.  Please continue your efforts at being more active, low cholesterol diet, and weight control.  You are otherwise up to date with prevention measures today.  You will be contacted regarding the referral for: ENT for hearing, Neurology for memory, and Home Health with RN and Physical Therapy, and the MRI for the brain  Please keep your appointments with your specialists as you may have planned  Please go to the XRAY Department in the Basement (go straight as you get off the elevator) for the x-ray testing  Please go to the LAB in the Basement (turn left off the elevator) for the tests to be done today  You will be contacted by phone if any changes need to be made immediately.  Otherwise, you will receive a letter about your results with an explanation, but please check with MyChart first.  Please remember to sign up for MyChart if you have not done so, as this will be important to you in the future with finding out test results, communicating by private email, and scheduling acute appointments online when needed.

## 2018-12-24 NOTE — Telephone Encounter (Signed)
This was already done today as well, thanks

## 2018-12-25 ENCOUNTER — Other Ambulatory Visit (INDEPENDENT_AMBULATORY_CARE_PROVIDER_SITE_OTHER): Payer: Medicare Other

## 2018-12-25 ENCOUNTER — Encounter: Payer: Self-pay | Admitting: Internal Medicine

## 2018-12-25 ENCOUNTER — Ambulatory Visit (INDEPENDENT_AMBULATORY_CARE_PROVIDER_SITE_OTHER)
Admission: RE | Admit: 2018-12-25 | Discharge: 2018-12-25 | Disposition: A | Discharge status: Home / Self Care | Payer: Medicare Other | Source: Ambulatory Visit | Attending: Internal Medicine | Admitting: Internal Medicine

## 2018-12-25 DIAGNOSIS — R634 Abnormal weight loss: Secondary | ICD-10-CM

## 2018-12-25 DIAGNOSIS — Z0001 Encounter for general adult medical examination with abnormal findings: Secondary | ICD-10-CM | POA: Diagnosis not present

## 2018-12-25 DIAGNOSIS — R29818 Other symptoms and signs involving the nervous system: Secondary | ICD-10-CM

## 2018-12-25 LAB — URINALYSIS, ROUTINE W REFLEX MICROSCOPIC
Bilirubin Urine: NEGATIVE
Hgb urine dipstick: NEGATIVE
Ketones, ur: NEGATIVE
Leukocytes,Ua: NEGATIVE
Nitrite: NEGATIVE
RBC / HPF: NONE SEEN (ref 0–?)
Specific Gravity, Urine: <=1.005 — AB (ref 1.000–1.030)
Total Protein, Urine: NEGATIVE
Urine Glucose: NEGATIVE
Urobilinogen, UA: 0.2 (ref 0.0–1.0)
WBC, UA: NONE SEEN (ref 0–?)
pH: 7 (ref 5.0–8.0)

## 2018-12-29 ENCOUNTER — Encounter: Payer: Self-pay | Admitting: Internal Medicine

## 2018-12-29 NOTE — Assessment & Plan Note (Signed)
Also for ENT referral, no wax impactions today, likely could benefit from hearing aids

## 2018-12-29 NOTE — Assessment & Plan Note (Signed)
Also for TFTs with labs given the wt loss

## 2018-12-29 NOTE — Assessment & Plan Note (Signed)
stable overall by history and exam, recent data reviewed with pt, and pt to continue medical treatment as before,  to f/u any worsening symptoms or concerns  

## 2018-12-29 NOTE — Assessment & Plan Note (Signed)
[facility] for [facility] with PT, RN and [facility] referral,  to f/u any worsening symptoms or concerns

## 2018-12-29 NOTE — Assessment & Plan Note (Signed)
For Hgb with labs, r/o worsening

## 2018-12-29 NOTE — Assessment & Plan Note (Signed)
Etiology unclear, for cxr

## 2018-12-29 NOTE — Assessment & Plan Note (Signed)
For b12 IM today as is due and missed several doses recently

## 2018-12-29 NOTE — Assessment & Plan Note (Addendum)
Symptomatically worsening, for check b12 as above, brain MRI, and refer neurology per wife reqeust  In addition to the time spent performing CPE, I spent an additional 40 minutes face to face,in which greater than 50% of this time was spent in counseling and coordination of care for patient's acute illness as documented, including the differential dx, treatment, further evaluation and other management of dementia, wt loss, hyperthyroidism, b12 deficiency, hyperglycemia, CKD, anemia, hearing loss and gait disorder

## 2018-12-31 ENCOUNTER — Telehealth: Payer: Self-pay | Admitting: Internal Medicine

## 2018-12-31 DIAGNOSIS — C3491 Malignant neoplasm of unspecified part of right bronchus or lung: Secondary | ICD-10-CM | POA: Diagnosis not present

## 2018-12-31 DIAGNOSIS — I129 Hypertensive chronic kidney disease with stage 1 through stage 4 chronic kidney disease, or unspecified chronic kidney disease: Secondary | ICD-10-CM | POA: Diagnosis not present

## 2018-12-31 DIAGNOSIS — N183 Chronic kidney disease, stage 3 (moderate): Secondary | ICD-10-CM | POA: Diagnosis not present

## 2018-12-31 DIAGNOSIS — Z7982 Long term (current) use of aspirin: Secondary | ICD-10-CM | POA: Diagnosis not present

## 2018-12-31 DIAGNOSIS — M1990 Unspecified osteoarthritis, unspecified site: Secondary | ICD-10-CM | POA: Diagnosis not present

## 2018-12-31 DIAGNOSIS — Z720 Tobacco use: Secondary | ICD-10-CM | POA: Diagnosis not present

## 2018-12-31 DIAGNOSIS — Z8673 Personal history of transient ischemic attack (TIA), and cerebral infarction without residual deficits: Secondary | ICD-10-CM | POA: Diagnosis not present

## 2018-12-31 DIAGNOSIS — E0590 Thyrotoxicosis, unspecified without thyrotoxic crisis or storm: Secondary | ICD-10-CM | POA: Diagnosis not present

## 2018-12-31 DIAGNOSIS — D631 Anemia in chronic kidney disease: Secondary | ICD-10-CM | POA: Diagnosis not present

## 2018-12-31 DIAGNOSIS — E785 Hyperlipidemia, unspecified: Secondary | ICD-10-CM | POA: Diagnosis not present

## 2018-12-31 DIAGNOSIS — R2681 Unsteadiness on feet: Secondary | ICD-10-CM | POA: Diagnosis not present

## 2018-12-31 DIAGNOSIS — I739 Peripheral vascular disease, unspecified: Secondary | ICD-10-CM | POA: Diagnosis not present

## 2018-12-31 DIAGNOSIS — Z7902 Long term (current) use of antithrombotics/antiplatelets: Secondary | ICD-10-CM | POA: Diagnosis not present

## 2018-12-31 DIAGNOSIS — Z96643 Presence of artificial hip joint, bilateral: Secondary | ICD-10-CM | POA: Diagnosis not present

## 2018-12-31 DIAGNOSIS — D696 Thrombocytopenia, unspecified: Secondary | ICD-10-CM | POA: Diagnosis not present

## 2018-12-31 DIAGNOSIS — Z79899 Other long term (current) drug therapy: Secondary | ICD-10-CM | POA: Diagnosis not present

## 2018-12-31 DIAGNOSIS — J439 Emphysema, unspecified: Secondary | ICD-10-CM | POA: Diagnosis not present

## 2018-12-31 DIAGNOSIS — I2510 Atherosclerotic heart disease of native coronary artery without angina pectoris: Secondary | ICD-10-CM | POA: Diagnosis not present

## 2018-12-31 DIAGNOSIS — E538 Deficiency of other specified B group vitamins: Secondary | ICD-10-CM | POA: Diagnosis not present

## 2018-12-31 NOTE — Telephone Encounter (Signed)
Victor Davis, calling back, states that she would also like social work eval added on, if possible.

## 2018-12-31 NOTE — Telephone Encounter (Signed)
Copied from [place] [phone]. Topic: Quick Communication - Home Health Verbal Orders >> Dec 31, 2018  4:30 PM Mathis Bud wrote: Caller/Agency: [name] from [facility]  [place] Number: [phone] Requesting PT Frequency: 2x 3weeks , 1x 4weeks, 2 as needed.

## 2018-12-31 NOTE — Telephone Encounter (Signed)
Copied from [place] [phone]. Topic: Quick Communication - Rx Refill/Question >> Dec 31, 2018  4:36 PM Mathis Bud wrote: Medication: citalopram (CELEXA) 20 MG tablet  meclizine (ANTIVERT) 25 MG tablet   Has the patient contacted their pharmacy? Yes, no more refills   Preferred Pharmacy (with phone number or street name): [id number] - [place], [place] [phone] (Phone) [phone] (Fax)    Agent: Please be advised that RX refills may take up to 3 business days. We ask that you follow-up with your pharmacy.

## 2019-01-01 ENCOUNTER — Other Ambulatory Visit: Payer: Self-pay | Admitting: Internal Medicine

## 2019-01-01 DIAGNOSIS — C44311 Basal cell carcinoma of skin of nose: Secondary | ICD-10-CM | POA: Diagnosis not present

## 2019-01-01 MED ORDER — MECLIZINE HCL 25 MG PO TABS: 12.5000 mg | ORAL_TABLET | ORAL | 2 refills | Status: DC | PRN

## 2019-01-01 MED ORDER — CITALOPRAM HYDROBROMIDE 20 MG PO TABS: 20.0000 mg | ORAL_TABLET | Freq: Every day | ORAL | 1 refills | Status: DC

## 2019-01-01 NOTE — Telephone Encounter (Signed)
Attempted to contact Victor Davis at number provided below but was unsuccessful. No VM, number continuously rings.

## 2019-01-02 DIAGNOSIS — I739 Peripheral vascular disease, unspecified: Secondary | ICD-10-CM | POA: Diagnosis not present

## 2019-01-02 DIAGNOSIS — C3491 Malignant neoplasm of unspecified part of right bronchus or lung: Secondary | ICD-10-CM | POA: Diagnosis not present

## 2019-01-02 DIAGNOSIS — D631 Anemia in chronic kidney disease: Secondary | ICD-10-CM | POA: Diagnosis not present

## 2019-01-02 DIAGNOSIS — E538 Deficiency of other specified B group vitamins: Secondary | ICD-10-CM | POA: Diagnosis not present

## 2019-01-02 DIAGNOSIS — Z7982 Long term (current) use of aspirin: Secondary | ICD-10-CM | POA: Diagnosis not present

## 2019-01-02 DIAGNOSIS — Z79899 Other long term (current) drug therapy: Secondary | ICD-10-CM | POA: Diagnosis not present

## 2019-01-02 DIAGNOSIS — E0590 Thyrotoxicosis, unspecified without thyrotoxic crisis or storm: Secondary | ICD-10-CM | POA: Diagnosis not present

## 2019-01-02 DIAGNOSIS — N183 Chronic kidney disease, stage 3 (moderate): Secondary | ICD-10-CM | POA: Diagnosis not present

## 2019-01-02 DIAGNOSIS — R2681 Unsteadiness on feet: Secondary | ICD-10-CM | POA: Diagnosis not present

## 2019-01-02 DIAGNOSIS — Z7902 Long term (current) use of antithrombotics/antiplatelets: Secondary | ICD-10-CM | POA: Diagnosis not present

## 2019-01-02 DIAGNOSIS — E785 Hyperlipidemia, unspecified: Secondary | ICD-10-CM | POA: Diagnosis not present

## 2019-01-02 DIAGNOSIS — I129 Hypertensive chronic kidney disease with stage 1 through stage 4 chronic kidney disease, or unspecified chronic kidney disease: Secondary | ICD-10-CM | POA: Diagnosis not present

## 2019-01-02 DIAGNOSIS — M1990 Unspecified osteoarthritis, unspecified site: Secondary | ICD-10-CM | POA: Diagnosis not present

## 2019-01-02 DIAGNOSIS — I2510 Atherosclerotic heart disease of native coronary artery without angina pectoris: Secondary | ICD-10-CM | POA: Diagnosis not present

## 2019-01-02 DIAGNOSIS — Z96643 Presence of artificial hip joint, bilateral: Secondary | ICD-10-CM | POA: Diagnosis not present

## 2019-01-02 DIAGNOSIS — J439 Emphysema, unspecified: Secondary | ICD-10-CM | POA: Diagnosis not present

## 2019-01-02 DIAGNOSIS — Z720 Tobacco use: Secondary | ICD-10-CM | POA: Diagnosis not present

## 2019-01-02 DIAGNOSIS — Z8673 Personal history of transient ischemic attack (TIA), and cerebral infarction without residual deficits: Secondary | ICD-10-CM | POA: Diagnosis not present

## 2019-01-02 DIAGNOSIS — D696 Thrombocytopenia, unspecified: Secondary | ICD-10-CM | POA: Diagnosis not present

## 2019-01-03 ENCOUNTER — Telehealth: Payer: Self-pay

## 2019-01-03 DIAGNOSIS — R2681 Unsteadiness on feet: Secondary | ICD-10-CM | POA: Diagnosis not present

## 2019-01-03 DIAGNOSIS — E0590 Thyrotoxicosis, unspecified without thyrotoxic crisis or storm: Secondary | ICD-10-CM | POA: Diagnosis not present

## 2019-01-03 DIAGNOSIS — D696 Thrombocytopenia, unspecified: Secondary | ICD-10-CM | POA: Diagnosis not present

## 2019-01-03 DIAGNOSIS — D631 Anemia in chronic kidney disease: Secondary | ICD-10-CM | POA: Diagnosis not present

## 2019-01-03 DIAGNOSIS — J439 Emphysema, unspecified: Secondary | ICD-10-CM | POA: Diagnosis not present

## 2019-01-03 DIAGNOSIS — Z96643 Presence of artificial hip joint, bilateral: Secondary | ICD-10-CM | POA: Diagnosis not present

## 2019-01-03 DIAGNOSIS — Z7982 Long term (current) use of aspirin: Secondary | ICD-10-CM | POA: Diagnosis not present

## 2019-01-03 DIAGNOSIS — E538 Deficiency of other specified B group vitamins: Secondary | ICD-10-CM | POA: Diagnosis not present

## 2019-01-03 DIAGNOSIS — I2510 Atherosclerotic heart disease of native coronary artery without angina pectoris: Secondary | ICD-10-CM | POA: Diagnosis not present

## 2019-01-03 DIAGNOSIS — Z7902 Long term (current) use of antithrombotics/antiplatelets: Secondary | ICD-10-CM | POA: Diagnosis not present

## 2019-01-03 DIAGNOSIS — Z720 Tobacco use: Secondary | ICD-10-CM | POA: Diagnosis not present

## 2019-01-03 DIAGNOSIS — E785 Hyperlipidemia, unspecified: Secondary | ICD-10-CM | POA: Diagnosis not present

## 2019-01-03 DIAGNOSIS — I739 Peripheral vascular disease, unspecified: Secondary | ICD-10-CM | POA: Diagnosis not present

## 2019-01-03 DIAGNOSIS — M1990 Unspecified osteoarthritis, unspecified site: Secondary | ICD-10-CM | POA: Diagnosis not present

## 2019-01-03 DIAGNOSIS — C3491 Malignant neoplasm of unspecified part of right bronchus or lung: Secondary | ICD-10-CM | POA: Diagnosis not present

## 2019-01-03 DIAGNOSIS — Z79899 Other long term (current) drug therapy: Secondary | ICD-10-CM | POA: Diagnosis not present

## 2019-01-03 DIAGNOSIS — Z8673 Personal history of transient ischemic attack (TIA), and cerebral infarction without residual deficits: Secondary | ICD-10-CM | POA: Diagnosis not present

## 2019-01-03 DIAGNOSIS — N183 Chronic kidney disease, stage 3 (moderate): Secondary | ICD-10-CM | POA: Diagnosis not present

## 2019-01-03 DIAGNOSIS — I129 Hypertensive chronic kidney disease with stage 1 through stage 4 chronic kidney disease, or unspecified chronic kidney disease: Secondary | ICD-10-CM | POA: Diagnosis not present

## 2019-01-03 NOTE — Telephone Encounter (Signed)
VERBAL ORDERS GIVEN.   Copied from [place] [phone]. Topic: Quick Communication - Home Health Verbal Orders >> Jan 03, 2019 12:42 PM Virl Axe D wrote: Caller/Agency: [facility] Number: [phone] / No vm Requesting OT/PT/Skilled Nursing/Social Work/Speech Therapy: PT Frequency: 2 week 3/ 1 week 2 / strength and balance / safety with gait

## 2019-01-07 DIAGNOSIS — Z7902 Long term (current) use of antithrombotics/antiplatelets: Secondary | ICD-10-CM | POA: Diagnosis not present

## 2019-01-07 DIAGNOSIS — Z96643 Presence of artificial hip joint, bilateral: Secondary | ICD-10-CM | POA: Diagnosis not present

## 2019-01-07 DIAGNOSIS — Z720 Tobacco use: Secondary | ICD-10-CM | POA: Diagnosis not present

## 2019-01-07 DIAGNOSIS — D696 Thrombocytopenia, unspecified: Secondary | ICD-10-CM | POA: Diagnosis not present

## 2019-01-07 DIAGNOSIS — M1990 Unspecified osteoarthritis, unspecified site: Secondary | ICD-10-CM | POA: Diagnosis not present

## 2019-01-07 DIAGNOSIS — Z79899 Other long term (current) drug therapy: Secondary | ICD-10-CM | POA: Diagnosis not present

## 2019-01-07 DIAGNOSIS — E785 Hyperlipidemia, unspecified: Secondary | ICD-10-CM | POA: Diagnosis not present

## 2019-01-07 DIAGNOSIS — E538 Deficiency of other specified B group vitamins: Secondary | ICD-10-CM | POA: Diagnosis not present

## 2019-01-07 DIAGNOSIS — J439 Emphysema, unspecified: Secondary | ICD-10-CM | POA: Diagnosis not present

## 2019-01-07 DIAGNOSIS — Z7982 Long term (current) use of aspirin: Secondary | ICD-10-CM | POA: Diagnosis not present

## 2019-01-07 DIAGNOSIS — N183 Chronic kidney disease, stage 3 (moderate): Secondary | ICD-10-CM | POA: Diagnosis not present

## 2019-01-07 DIAGNOSIS — C3491 Malignant neoplasm of unspecified part of right bronchus or lung: Secondary | ICD-10-CM | POA: Diagnosis not present

## 2019-01-07 DIAGNOSIS — I739 Peripheral vascular disease, unspecified: Secondary | ICD-10-CM | POA: Diagnosis not present

## 2019-01-07 DIAGNOSIS — I129 Hypertensive chronic kidney disease with stage 1 through stage 4 chronic kidney disease, or unspecified chronic kidney disease: Secondary | ICD-10-CM | POA: Diagnosis not present

## 2019-01-07 DIAGNOSIS — Z8673 Personal history of transient ischemic attack (TIA), and cerebral infarction without residual deficits: Secondary | ICD-10-CM | POA: Diagnosis not present

## 2019-01-07 DIAGNOSIS — R2681 Unsteadiness on feet: Secondary | ICD-10-CM | POA: Diagnosis not present

## 2019-01-07 DIAGNOSIS — E0590 Thyrotoxicosis, unspecified without thyrotoxic crisis or storm: Secondary | ICD-10-CM | POA: Diagnosis not present

## 2019-01-07 DIAGNOSIS — D631 Anemia in chronic kidney disease: Secondary | ICD-10-CM | POA: Diagnosis not present

## 2019-01-07 DIAGNOSIS — I2510 Atherosclerotic heart disease of native coronary artery without angina pectoris: Secondary | ICD-10-CM | POA: Diagnosis not present

## 2019-01-08 DIAGNOSIS — I739 Peripheral vascular disease, unspecified: Secondary | ICD-10-CM | POA: Diagnosis not present

## 2019-01-08 DIAGNOSIS — F0280 Dementia in other diseases classified elsewhere without behavioral disturbance: Secondary | ICD-10-CM | POA: Diagnosis not present

## 2019-01-08 DIAGNOSIS — Z7982 Long term (current) use of aspirin: Secondary | ICD-10-CM | POA: Diagnosis not present

## 2019-01-08 DIAGNOSIS — E785 Hyperlipidemia, unspecified: Secondary | ICD-10-CM | POA: Diagnosis not present

## 2019-01-08 DIAGNOSIS — C3491 Malignant neoplasm of unspecified part of right bronchus or lung: Secondary | ICD-10-CM | POA: Diagnosis not present

## 2019-01-08 DIAGNOSIS — Z720 Tobacco use: Secondary | ICD-10-CM | POA: Diagnosis not present

## 2019-01-08 DIAGNOSIS — Z96643 Presence of artificial hip joint, bilateral: Secondary | ICD-10-CM | POA: Diagnosis not present

## 2019-01-08 DIAGNOSIS — Z8673 Personal history of transient ischemic attack (TIA), and cerebral infarction without residual deficits: Secondary | ICD-10-CM | POA: Diagnosis not present

## 2019-01-08 DIAGNOSIS — F329 Major depressive disorder, single episode, unspecified: Secondary | ICD-10-CM

## 2019-01-08 DIAGNOSIS — N183 Chronic kidney disease, stage 3 (moderate): Secondary | ICD-10-CM | POA: Diagnosis not present

## 2019-01-08 DIAGNOSIS — D696 Thrombocytopenia, unspecified: Secondary | ICD-10-CM | POA: Diagnosis not present

## 2019-01-08 DIAGNOSIS — Z7902 Long term (current) use of antithrombotics/antiplatelets: Secondary | ICD-10-CM | POA: Diagnosis not present

## 2019-01-08 DIAGNOSIS — R2681 Unsteadiness on feet: Secondary | ICD-10-CM | POA: Diagnosis not present

## 2019-01-08 DIAGNOSIS — I2510 Atherosclerotic heart disease of native coronary artery without angina pectoris: Secondary | ICD-10-CM | POA: Diagnosis not present

## 2019-01-08 DIAGNOSIS — M1990 Unspecified osteoarthritis, unspecified site: Secondary | ICD-10-CM | POA: Diagnosis not present

## 2019-01-08 DIAGNOSIS — J439 Emphysema, unspecified: Secondary | ICD-10-CM | POA: Diagnosis not present

## 2019-01-08 DIAGNOSIS — I129 Hypertensive chronic kidney disease with stage 1 through stage 4 chronic kidney disease, or unspecified chronic kidney disease: Secondary | ICD-10-CM | POA: Diagnosis not present

## 2019-01-08 DIAGNOSIS — Z79899 Other long term (current) drug therapy: Secondary | ICD-10-CM | POA: Diagnosis not present

## 2019-01-08 DIAGNOSIS — E538 Deficiency of other specified B group vitamins: Secondary | ICD-10-CM | POA: Diagnosis not present

## 2019-01-08 DIAGNOSIS — R634 Abnormal weight loss: Secondary | ICD-10-CM | POA: Diagnosis not present

## 2019-01-08 DIAGNOSIS — E0590 Thyrotoxicosis, unspecified without thyrotoxic crisis or storm: Secondary | ICD-10-CM | POA: Diagnosis not present

## 2019-01-08 DIAGNOSIS — Z8546 Personal history of malignant neoplasm of prostate: Secondary | ICD-10-CM

## 2019-01-08 DIAGNOSIS — D631 Anemia in chronic kidney disease: Secondary | ICD-10-CM | POA: Diagnosis not present

## 2019-01-09 DIAGNOSIS — D631 Anemia in chronic kidney disease: Secondary | ICD-10-CM | POA: Diagnosis not present

## 2019-01-09 DIAGNOSIS — Z8673 Personal history of transient ischemic attack (TIA), and cerebral infarction without residual deficits: Secondary | ICD-10-CM | POA: Diagnosis not present

## 2019-01-09 DIAGNOSIS — M1990 Unspecified osteoarthritis, unspecified site: Secondary | ICD-10-CM | POA: Diagnosis not present

## 2019-01-09 DIAGNOSIS — E538 Deficiency of other specified B group vitamins: Secondary | ICD-10-CM | POA: Diagnosis not present

## 2019-01-09 DIAGNOSIS — N183 Chronic kidney disease, stage 3 (moderate): Secondary | ICD-10-CM | POA: Diagnosis not present

## 2019-01-09 DIAGNOSIS — Z7902 Long term (current) use of antithrombotics/antiplatelets: Secondary | ICD-10-CM | POA: Diagnosis not present

## 2019-01-09 DIAGNOSIS — Z79899 Other long term (current) drug therapy: Secondary | ICD-10-CM | POA: Diagnosis not present

## 2019-01-09 DIAGNOSIS — Z96643 Presence of artificial hip joint, bilateral: Secondary | ICD-10-CM | POA: Diagnosis not present

## 2019-01-09 DIAGNOSIS — C3491 Malignant neoplasm of unspecified part of right bronchus or lung: Secondary | ICD-10-CM | POA: Diagnosis not present

## 2019-01-09 DIAGNOSIS — D696 Thrombocytopenia, unspecified: Secondary | ICD-10-CM | POA: Diagnosis not present

## 2019-01-09 DIAGNOSIS — I2510 Atherosclerotic heart disease of native coronary artery without angina pectoris: Secondary | ICD-10-CM | POA: Diagnosis not present

## 2019-01-09 DIAGNOSIS — I129 Hypertensive chronic kidney disease with stage 1 through stage 4 chronic kidney disease, or unspecified chronic kidney disease: Secondary | ICD-10-CM | POA: Diagnosis not present

## 2019-01-09 DIAGNOSIS — E0590 Thyrotoxicosis, unspecified without thyrotoxic crisis or storm: Secondary | ICD-10-CM | POA: Diagnosis not present

## 2019-01-09 DIAGNOSIS — R2681 Unsteadiness on feet: Secondary | ICD-10-CM | POA: Diagnosis not present

## 2019-01-09 DIAGNOSIS — Z720 Tobacco use: Secondary | ICD-10-CM | POA: Diagnosis not present

## 2019-01-09 DIAGNOSIS — I739 Peripheral vascular disease, unspecified: Secondary | ICD-10-CM | POA: Diagnosis not present

## 2019-01-09 DIAGNOSIS — E785 Hyperlipidemia, unspecified: Secondary | ICD-10-CM | POA: Diagnosis not present

## 2019-01-09 DIAGNOSIS — J439 Emphysema, unspecified: Secondary | ICD-10-CM | POA: Diagnosis not present

## 2019-01-09 DIAGNOSIS — Z7982 Long term (current) use of aspirin: Secondary | ICD-10-CM | POA: Diagnosis not present

## 2019-01-14 DIAGNOSIS — H2512 Age-related nuclear cataract, left eye: Secondary | ICD-10-CM | POA: Diagnosis not present

## 2019-01-14 DIAGNOSIS — Z961 Presence of intraocular lens: Secondary | ICD-10-CM | POA: Diagnosis not present

## 2019-01-15 DIAGNOSIS — R2681 Unsteadiness on feet: Secondary | ICD-10-CM | POA: Diagnosis not present

## 2019-01-15 DIAGNOSIS — Z7982 Long term (current) use of aspirin: Secondary | ICD-10-CM | POA: Diagnosis not present

## 2019-01-15 DIAGNOSIS — Z720 Tobacco use: Secondary | ICD-10-CM | POA: Diagnosis not present

## 2019-01-15 DIAGNOSIS — H9193 Unspecified hearing loss, bilateral: Secondary | ICD-10-CM | POA: Diagnosis not present

## 2019-01-15 DIAGNOSIS — C3491 Malignant neoplasm of unspecified part of right bronchus or lung: Secondary | ICD-10-CM | POA: Diagnosis not present

## 2019-01-15 DIAGNOSIS — D696 Thrombocytopenia, unspecified: Secondary | ICD-10-CM | POA: Diagnosis not present

## 2019-01-15 DIAGNOSIS — I2510 Atherosclerotic heart disease of native coronary artery without angina pectoris: Secondary | ICD-10-CM | POA: Diagnosis not present

## 2019-01-15 DIAGNOSIS — M1990 Unspecified osteoarthritis, unspecified site: Secondary | ICD-10-CM | POA: Diagnosis not present

## 2019-01-15 DIAGNOSIS — Z79899 Other long term (current) drug therapy: Secondary | ICD-10-CM | POA: Diagnosis not present

## 2019-01-15 DIAGNOSIS — I739 Peripheral vascular disease, unspecified: Secondary | ICD-10-CM | POA: Diagnosis not present

## 2019-01-15 DIAGNOSIS — I129 Hypertensive chronic kidney disease with stage 1 through stage 4 chronic kidney disease, or unspecified chronic kidney disease: Secondary | ICD-10-CM | POA: Diagnosis not present

## 2019-01-15 DIAGNOSIS — D631 Anemia in chronic kidney disease: Secondary | ICD-10-CM | POA: Diagnosis not present

## 2019-01-15 DIAGNOSIS — Z7902 Long term (current) use of antithrombotics/antiplatelets: Secondary | ICD-10-CM | POA: Diagnosis not present

## 2019-01-15 DIAGNOSIS — Z8673 Personal history of transient ischemic attack (TIA), and cerebral infarction without residual deficits: Secondary | ICD-10-CM | POA: Diagnosis not present

## 2019-01-15 DIAGNOSIS — E0590 Thyrotoxicosis, unspecified without thyrotoxic crisis or storm: Secondary | ICD-10-CM | POA: Diagnosis not present

## 2019-01-15 DIAGNOSIS — H903 Sensorineural hearing loss, bilateral: Secondary | ICD-10-CM | POA: Diagnosis not present

## 2019-01-15 DIAGNOSIS — J439 Emphysema, unspecified: Secondary | ICD-10-CM | POA: Diagnosis not present

## 2019-01-15 DIAGNOSIS — E785 Hyperlipidemia, unspecified: Secondary | ICD-10-CM | POA: Diagnosis not present

## 2019-01-15 DIAGNOSIS — Z96643 Presence of artificial hip joint, bilateral: Secondary | ICD-10-CM | POA: Diagnosis not present

## 2019-01-15 DIAGNOSIS — E538 Deficiency of other specified B group vitamins: Secondary | ICD-10-CM | POA: Diagnosis not present

## 2019-01-15 DIAGNOSIS — N183 Chronic kidney disease, stage 3 (moderate): Secondary | ICD-10-CM | POA: Diagnosis not present

## 2019-01-16 ENCOUNTER — Other Ambulatory Visit: Payer: Self-pay

## 2019-01-16 ENCOUNTER — Encounter: Payer: Self-pay | Admitting: Internal Medicine

## 2019-01-16 ENCOUNTER — Ambulatory Visit
Admission: RE | Admit: 2019-01-16 | Discharge: 2019-01-16 | Disposition: A | Discharge status: Home / Self Care | Payer: Medicare Other | Source: Ambulatory Visit | Attending: Internal Medicine | Admitting: Internal Medicine

## 2019-01-16 DIAGNOSIS — R29818 Other symptoms and signs involving the nervous system: Secondary | ICD-10-CM

## 2019-01-16 DIAGNOSIS — Z720 Tobacco use: Secondary | ICD-10-CM | POA: Diagnosis not present

## 2019-01-16 DIAGNOSIS — N183 Chronic kidney disease, stage 3 (moderate): Secondary | ICD-10-CM | POA: Diagnosis not present

## 2019-01-16 DIAGNOSIS — Z7982 Long term (current) use of aspirin: Secondary | ICD-10-CM | POA: Diagnosis not present

## 2019-01-16 DIAGNOSIS — J439 Emphysema, unspecified: Secondary | ICD-10-CM | POA: Diagnosis not present

## 2019-01-16 DIAGNOSIS — D696 Thrombocytopenia, unspecified: Secondary | ICD-10-CM | POA: Diagnosis not present

## 2019-01-16 DIAGNOSIS — C3491 Malignant neoplasm of unspecified part of right bronchus or lung: Secondary | ICD-10-CM | POA: Diagnosis not present

## 2019-01-16 DIAGNOSIS — Z96643 Presence of artificial hip joint, bilateral: Secondary | ICD-10-CM | POA: Diagnosis not present

## 2019-01-16 DIAGNOSIS — Z79899 Other long term (current) drug therapy: Secondary | ICD-10-CM | POA: Diagnosis not present

## 2019-01-16 DIAGNOSIS — H9190 Unspecified hearing loss, unspecified ear: Secondary | ICD-10-CM | POA: Diagnosis not present

## 2019-01-16 DIAGNOSIS — Z7902 Long term (current) use of antithrombotics/antiplatelets: Secondary | ICD-10-CM | POA: Diagnosis not present

## 2019-01-16 DIAGNOSIS — I129 Hypertensive chronic kidney disease with stage 1 through stage 4 chronic kidney disease, or unspecified chronic kidney disease: Secondary | ICD-10-CM | POA: Diagnosis not present

## 2019-01-16 DIAGNOSIS — R410 Disorientation, unspecified: Secondary | ICD-10-CM | POA: Diagnosis not present

## 2019-01-16 DIAGNOSIS — E0590 Thyrotoxicosis, unspecified without thyrotoxic crisis or storm: Secondary | ICD-10-CM | POA: Diagnosis not present

## 2019-01-16 DIAGNOSIS — I2510 Atherosclerotic heart disease of native coronary artery without angina pectoris: Secondary | ICD-10-CM | POA: Diagnosis not present

## 2019-01-16 DIAGNOSIS — R413 Other amnesia: Secondary | ICD-10-CM | POA: Diagnosis not present

## 2019-01-16 DIAGNOSIS — M1990 Unspecified osteoarthritis, unspecified site: Secondary | ICD-10-CM | POA: Diagnosis not present

## 2019-01-16 DIAGNOSIS — I739 Peripheral vascular disease, unspecified: Secondary | ICD-10-CM | POA: Diagnosis not present

## 2019-01-16 DIAGNOSIS — D631 Anemia in chronic kidney disease: Secondary | ICD-10-CM | POA: Diagnosis not present

## 2019-01-16 DIAGNOSIS — E538 Deficiency of other specified B group vitamins: Secondary | ICD-10-CM | POA: Diagnosis not present

## 2019-01-16 DIAGNOSIS — Z8673 Personal history of transient ischemic attack (TIA), and cerebral infarction without residual deficits: Secondary | ICD-10-CM | POA: Diagnosis not present

## 2019-01-16 DIAGNOSIS — R2681 Unsteadiness on feet: Secondary | ICD-10-CM | POA: Diagnosis not present

## 2019-01-16 DIAGNOSIS — E785 Hyperlipidemia, unspecified: Secondary | ICD-10-CM | POA: Diagnosis not present

## 2019-01-17 DIAGNOSIS — M1990 Unspecified osteoarthritis, unspecified site: Secondary | ICD-10-CM | POA: Diagnosis not present

## 2019-01-17 DIAGNOSIS — N183 Chronic kidney disease, stage 3 (moderate): Secondary | ICD-10-CM | POA: Diagnosis not present

## 2019-01-17 DIAGNOSIS — J439 Emphysema, unspecified: Secondary | ICD-10-CM | POA: Diagnosis not present

## 2019-01-17 DIAGNOSIS — R2681 Unsteadiness on feet: Secondary | ICD-10-CM | POA: Diagnosis not present

## 2019-01-17 DIAGNOSIS — Z720 Tobacco use: Secondary | ICD-10-CM | POA: Diagnosis not present

## 2019-01-17 DIAGNOSIS — Z79899 Other long term (current) drug therapy: Secondary | ICD-10-CM | POA: Diagnosis not present

## 2019-01-17 DIAGNOSIS — Z96643 Presence of artificial hip joint, bilateral: Secondary | ICD-10-CM | POA: Diagnosis not present

## 2019-01-17 DIAGNOSIS — I739 Peripheral vascular disease, unspecified: Secondary | ICD-10-CM | POA: Diagnosis not present

## 2019-01-17 DIAGNOSIS — E785 Hyperlipidemia, unspecified: Secondary | ICD-10-CM | POA: Diagnosis not present

## 2019-01-17 DIAGNOSIS — Z7902 Long term (current) use of antithrombotics/antiplatelets: Secondary | ICD-10-CM | POA: Diagnosis not present

## 2019-01-17 DIAGNOSIS — Z8673 Personal history of transient ischemic attack (TIA), and cerebral infarction without residual deficits: Secondary | ICD-10-CM | POA: Diagnosis not present

## 2019-01-17 DIAGNOSIS — Z7982 Long term (current) use of aspirin: Secondary | ICD-10-CM | POA: Diagnosis not present

## 2019-01-17 DIAGNOSIS — E0590 Thyrotoxicosis, unspecified without thyrotoxic crisis or storm: Secondary | ICD-10-CM | POA: Diagnosis not present

## 2019-01-17 DIAGNOSIS — I129 Hypertensive chronic kidney disease with stage 1 through stage 4 chronic kidney disease, or unspecified chronic kidney disease: Secondary | ICD-10-CM | POA: Diagnosis not present

## 2019-01-17 DIAGNOSIS — E538 Deficiency of other specified B group vitamins: Secondary | ICD-10-CM | POA: Diagnosis not present

## 2019-01-17 DIAGNOSIS — C3491 Malignant neoplasm of unspecified part of right bronchus or lung: Secondary | ICD-10-CM | POA: Diagnosis not present

## 2019-01-17 DIAGNOSIS — I2510 Atherosclerotic heart disease of native coronary artery without angina pectoris: Secondary | ICD-10-CM | POA: Diagnosis not present

## 2019-01-17 DIAGNOSIS — D631 Anemia in chronic kidney disease: Secondary | ICD-10-CM | POA: Diagnosis not present

## 2019-01-17 DIAGNOSIS — D696 Thrombocytopenia, unspecified: Secondary | ICD-10-CM | POA: Diagnosis not present

## 2019-01-20 ENCOUNTER — Other Ambulatory Visit: Payer: Self-pay | Admitting: Internal Medicine

## 2019-01-22 DIAGNOSIS — E785 Hyperlipidemia, unspecified: Secondary | ICD-10-CM | POA: Diagnosis not present

## 2019-01-22 DIAGNOSIS — Z8673 Personal history of transient ischemic attack (TIA), and cerebral infarction without residual deficits: Secondary | ICD-10-CM | POA: Diagnosis not present

## 2019-01-22 DIAGNOSIS — E0590 Thyrotoxicosis, unspecified without thyrotoxic crisis or storm: Secondary | ICD-10-CM | POA: Diagnosis not present

## 2019-01-22 DIAGNOSIS — M1990 Unspecified osteoarthritis, unspecified site: Secondary | ICD-10-CM | POA: Diagnosis not present

## 2019-01-22 DIAGNOSIS — D696 Thrombocytopenia, unspecified: Secondary | ICD-10-CM | POA: Diagnosis not present

## 2019-01-22 DIAGNOSIS — J439 Emphysema, unspecified: Secondary | ICD-10-CM | POA: Diagnosis not present

## 2019-01-22 DIAGNOSIS — Z720 Tobacco use: Secondary | ICD-10-CM | POA: Diagnosis not present

## 2019-01-22 DIAGNOSIS — R2681 Unsteadiness on feet: Secondary | ICD-10-CM | POA: Diagnosis not present

## 2019-01-22 DIAGNOSIS — I129 Hypertensive chronic kidney disease with stage 1 through stage 4 chronic kidney disease, or unspecified chronic kidney disease: Secondary | ICD-10-CM | POA: Diagnosis not present

## 2019-01-22 DIAGNOSIS — Z7982 Long term (current) use of aspirin: Secondary | ICD-10-CM | POA: Diagnosis not present

## 2019-01-22 DIAGNOSIS — I739 Peripheral vascular disease, unspecified: Secondary | ICD-10-CM | POA: Diagnosis not present

## 2019-01-22 DIAGNOSIS — Z7902 Long term (current) use of antithrombotics/antiplatelets: Secondary | ICD-10-CM | POA: Diagnosis not present

## 2019-01-22 DIAGNOSIS — C3491 Malignant neoplasm of unspecified part of right bronchus or lung: Secondary | ICD-10-CM | POA: Diagnosis not present

## 2019-01-22 DIAGNOSIS — Z96643 Presence of artificial hip joint, bilateral: Secondary | ICD-10-CM | POA: Diagnosis not present

## 2019-01-22 DIAGNOSIS — I2510 Atherosclerotic heart disease of native coronary artery without angina pectoris: Secondary | ICD-10-CM | POA: Diagnosis not present

## 2019-01-22 DIAGNOSIS — N183 Chronic kidney disease, stage 3 (moderate): Secondary | ICD-10-CM | POA: Diagnosis not present

## 2019-01-22 DIAGNOSIS — E538 Deficiency of other specified B group vitamins: Secondary | ICD-10-CM | POA: Diagnosis not present

## 2019-01-22 DIAGNOSIS — D631 Anemia in chronic kidney disease: Secondary | ICD-10-CM | POA: Diagnosis not present

## 2019-01-22 DIAGNOSIS — Z79899 Other long term (current) drug therapy: Secondary | ICD-10-CM | POA: Diagnosis not present

## 2019-01-23 DIAGNOSIS — C3491 Malignant neoplasm of unspecified part of right bronchus or lung: Secondary | ICD-10-CM | POA: Diagnosis not present

## 2019-01-23 DIAGNOSIS — E0590 Thyrotoxicosis, unspecified without thyrotoxic crisis or storm: Secondary | ICD-10-CM | POA: Diagnosis not present

## 2019-01-23 DIAGNOSIS — J439 Emphysema, unspecified: Secondary | ICD-10-CM | POA: Diagnosis not present

## 2019-01-23 DIAGNOSIS — E538 Deficiency of other specified B group vitamins: Secondary | ICD-10-CM | POA: Diagnosis not present

## 2019-01-23 DIAGNOSIS — M1990 Unspecified osteoarthritis, unspecified site: Secondary | ICD-10-CM | POA: Diagnosis not present

## 2019-01-23 DIAGNOSIS — R2681 Unsteadiness on feet: Secondary | ICD-10-CM | POA: Diagnosis not present

## 2019-01-23 DIAGNOSIS — I739 Peripheral vascular disease, unspecified: Secondary | ICD-10-CM | POA: Diagnosis not present

## 2019-01-23 DIAGNOSIS — Z7902 Long term (current) use of antithrombotics/antiplatelets: Secondary | ICD-10-CM | POA: Diagnosis not present

## 2019-01-23 DIAGNOSIS — Z7982 Long term (current) use of aspirin: Secondary | ICD-10-CM | POA: Diagnosis not present

## 2019-01-23 DIAGNOSIS — I2510 Atherosclerotic heart disease of native coronary artery without angina pectoris: Secondary | ICD-10-CM | POA: Diagnosis not present

## 2019-01-23 DIAGNOSIS — Z8673 Personal history of transient ischemic attack (TIA), and cerebral infarction without residual deficits: Secondary | ICD-10-CM | POA: Diagnosis not present

## 2019-01-23 DIAGNOSIS — N183 Chronic kidney disease, stage 3 (moderate): Secondary | ICD-10-CM | POA: Diagnosis not present

## 2019-01-23 DIAGNOSIS — D696 Thrombocytopenia, unspecified: Secondary | ICD-10-CM | POA: Diagnosis not present

## 2019-01-23 DIAGNOSIS — I129 Hypertensive chronic kidney disease with stage 1 through stage 4 chronic kidney disease, or unspecified chronic kidney disease: Secondary | ICD-10-CM | POA: Diagnosis not present

## 2019-01-23 DIAGNOSIS — Z96643 Presence of artificial hip joint, bilateral: Secondary | ICD-10-CM | POA: Diagnosis not present

## 2019-01-23 DIAGNOSIS — Z720 Tobacco use: Secondary | ICD-10-CM | POA: Diagnosis not present

## 2019-01-23 DIAGNOSIS — D631 Anemia in chronic kidney disease: Secondary | ICD-10-CM | POA: Diagnosis not present

## 2019-01-23 DIAGNOSIS — Z79899 Other long term (current) drug therapy: Secondary | ICD-10-CM | POA: Diagnosis not present

## 2019-01-23 DIAGNOSIS — E785 Hyperlipidemia, unspecified: Secondary | ICD-10-CM | POA: Diagnosis not present

## 2019-01-24 ENCOUNTER — Other Ambulatory Visit: Payer: Self-pay

## 2019-01-24 ENCOUNTER — Ambulatory Visit (INDEPENDENT_AMBULATORY_CARE_PROVIDER_SITE_OTHER): Payer: Medicare Other

## 2019-01-24 DIAGNOSIS — E538 Deficiency of other specified B group vitamins: Secondary | ICD-10-CM

## 2019-01-24 MED ORDER — CYANOCOBALAMIN 1000 MCG/ML IJ SOLN
1000.0000 ug | Freq: Once | INTRAMUSCULAR | Status: AC
  Administered 2019-01-24: 1000 ug via INTRAMUSCULAR

## 2019-01-24 NOTE — Progress Notes (Signed)
Medical screening examination/treatment/procedure(s) were performed by non-physician practitioner and as supervising physician I was immediately available for consultation/collaboration. I agree with above. James John, MD   

## 2019-01-28 DIAGNOSIS — I739 Peripheral vascular disease, unspecified: Secondary | ICD-10-CM | POA: Diagnosis not present

## 2019-01-28 DIAGNOSIS — E538 Deficiency of other specified B group vitamins: Secondary | ICD-10-CM | POA: Diagnosis not present

## 2019-01-28 DIAGNOSIS — I129 Hypertensive chronic kidney disease with stage 1 through stage 4 chronic kidney disease, or unspecified chronic kidney disease: Secondary | ICD-10-CM | POA: Diagnosis not present

## 2019-01-28 DIAGNOSIS — Z96643 Presence of artificial hip joint, bilateral: Secondary | ICD-10-CM | POA: Diagnosis not present

## 2019-01-28 DIAGNOSIS — R2681 Unsteadiness on feet: Secondary | ICD-10-CM | POA: Diagnosis not present

## 2019-01-28 DIAGNOSIS — N183 Chronic kidney disease, stage 3 (moderate): Secondary | ICD-10-CM | POA: Diagnosis not present

## 2019-01-28 DIAGNOSIS — D631 Anemia in chronic kidney disease: Secondary | ICD-10-CM | POA: Diagnosis not present

## 2019-01-28 DIAGNOSIS — M1990 Unspecified osteoarthritis, unspecified site: Secondary | ICD-10-CM | POA: Diagnosis not present

## 2019-01-28 DIAGNOSIS — J439 Emphysema, unspecified: Secondary | ICD-10-CM | POA: Diagnosis not present

## 2019-01-28 DIAGNOSIS — C3491 Malignant neoplasm of unspecified part of right bronchus or lung: Secondary | ICD-10-CM | POA: Diagnosis not present

## 2019-01-28 DIAGNOSIS — D696 Thrombocytopenia, unspecified: Secondary | ICD-10-CM | POA: Diagnosis not present

## 2019-01-28 DIAGNOSIS — Z8673 Personal history of transient ischemic attack (TIA), and cerebral infarction without residual deficits: Secondary | ICD-10-CM | POA: Diagnosis not present

## 2019-01-28 DIAGNOSIS — I2510 Atherosclerotic heart disease of native coronary artery without angina pectoris: Secondary | ICD-10-CM | POA: Diagnosis not present

## 2019-01-28 DIAGNOSIS — E0590 Thyrotoxicosis, unspecified without thyrotoxic crisis or storm: Secondary | ICD-10-CM | POA: Diagnosis not present

## 2019-01-28 DIAGNOSIS — Z79899 Other long term (current) drug therapy: Secondary | ICD-10-CM | POA: Diagnosis not present

## 2019-01-28 DIAGNOSIS — E785 Hyperlipidemia, unspecified: Secondary | ICD-10-CM | POA: Diagnosis not present

## 2019-01-28 DIAGNOSIS — Z7902 Long term (current) use of antithrombotics/antiplatelets: Secondary | ICD-10-CM | POA: Diagnosis not present

## 2019-01-28 DIAGNOSIS — Z7982 Long term (current) use of aspirin: Secondary | ICD-10-CM | POA: Diagnosis not present

## 2019-01-28 DIAGNOSIS — Z720 Tobacco use: Secondary | ICD-10-CM | POA: Diagnosis not present

## 2019-01-30 DIAGNOSIS — J439 Emphysema, unspecified: Secondary | ICD-10-CM | POA: Diagnosis not present

## 2019-01-30 DIAGNOSIS — R2681 Unsteadiness on feet: Secondary | ICD-10-CM | POA: Diagnosis not present

## 2019-01-30 DIAGNOSIS — Z7982 Long term (current) use of aspirin: Secondary | ICD-10-CM | POA: Diagnosis not present

## 2019-01-30 DIAGNOSIS — Z96643 Presence of artificial hip joint, bilateral: Secondary | ICD-10-CM | POA: Diagnosis not present

## 2019-01-30 DIAGNOSIS — I129 Hypertensive chronic kidney disease with stage 1 through stage 4 chronic kidney disease, or unspecified chronic kidney disease: Secondary | ICD-10-CM | POA: Diagnosis not present

## 2019-01-30 DIAGNOSIS — Z8673 Personal history of transient ischemic attack (TIA), and cerebral infarction without residual deficits: Secondary | ICD-10-CM | POA: Diagnosis not present

## 2019-01-30 DIAGNOSIS — Z720 Tobacco use: Secondary | ICD-10-CM | POA: Diagnosis not present

## 2019-01-30 DIAGNOSIS — E0590 Thyrotoxicosis, unspecified without thyrotoxic crisis or storm: Secondary | ICD-10-CM | POA: Diagnosis not present

## 2019-01-30 DIAGNOSIS — I739 Peripheral vascular disease, unspecified: Secondary | ICD-10-CM | POA: Diagnosis not present

## 2019-01-30 DIAGNOSIS — M1990 Unspecified osteoarthritis, unspecified site: Secondary | ICD-10-CM | POA: Diagnosis not present

## 2019-01-30 DIAGNOSIS — E538 Deficiency of other specified B group vitamins: Secondary | ICD-10-CM | POA: Diagnosis not present

## 2019-01-30 DIAGNOSIS — E785 Hyperlipidemia, unspecified: Secondary | ICD-10-CM | POA: Diagnosis not present

## 2019-01-30 DIAGNOSIS — N183 Chronic kidney disease, stage 3 (moderate): Secondary | ICD-10-CM | POA: Diagnosis not present

## 2019-01-30 DIAGNOSIS — Z7902 Long term (current) use of antithrombotics/antiplatelets: Secondary | ICD-10-CM | POA: Diagnosis not present

## 2019-01-30 DIAGNOSIS — D696 Thrombocytopenia, unspecified: Secondary | ICD-10-CM | POA: Diagnosis not present

## 2019-01-30 DIAGNOSIS — I2510 Atherosclerotic heart disease of native coronary artery without angina pectoris: Secondary | ICD-10-CM | POA: Diagnosis not present

## 2019-01-30 DIAGNOSIS — Z79899 Other long term (current) drug therapy: Secondary | ICD-10-CM | POA: Diagnosis not present

## 2019-01-30 DIAGNOSIS — D631 Anemia in chronic kidney disease: Secondary | ICD-10-CM | POA: Diagnosis not present

## 2019-01-30 DIAGNOSIS — C3491 Malignant neoplasm of unspecified part of right bronchus or lung: Secondary | ICD-10-CM | POA: Diagnosis not present

## 2019-02-04 DIAGNOSIS — C3491 Malignant neoplasm of unspecified part of right bronchus or lung: Secondary | ICD-10-CM | POA: Diagnosis not present

## 2019-02-04 DIAGNOSIS — E0590 Thyrotoxicosis, unspecified without thyrotoxic crisis or storm: Secondary | ICD-10-CM | POA: Diagnosis not present

## 2019-02-04 DIAGNOSIS — I129 Hypertensive chronic kidney disease with stage 1 through stage 4 chronic kidney disease, or unspecified chronic kidney disease: Secondary | ICD-10-CM | POA: Diagnosis not present

## 2019-02-04 DIAGNOSIS — Z8673 Personal history of transient ischemic attack (TIA), and cerebral infarction without residual deficits: Secondary | ICD-10-CM | POA: Diagnosis not present

## 2019-02-04 DIAGNOSIS — D631 Anemia in chronic kidney disease: Secondary | ICD-10-CM | POA: Diagnosis not present

## 2019-02-04 DIAGNOSIS — I739 Peripheral vascular disease, unspecified: Secondary | ICD-10-CM | POA: Diagnosis not present

## 2019-02-04 DIAGNOSIS — Z720 Tobacco use: Secondary | ICD-10-CM | POA: Diagnosis not present

## 2019-02-04 DIAGNOSIS — J439 Emphysema, unspecified: Secondary | ICD-10-CM | POA: Diagnosis not present

## 2019-02-04 DIAGNOSIS — D696 Thrombocytopenia, unspecified: Secondary | ICD-10-CM | POA: Diagnosis not present

## 2019-02-04 DIAGNOSIS — Z7902 Long term (current) use of antithrombotics/antiplatelets: Secondary | ICD-10-CM | POA: Diagnosis not present

## 2019-02-04 DIAGNOSIS — R2681 Unsteadiness on feet: Secondary | ICD-10-CM | POA: Diagnosis not present

## 2019-02-04 DIAGNOSIS — E538 Deficiency of other specified B group vitamins: Secondary | ICD-10-CM | POA: Diagnosis not present

## 2019-02-04 DIAGNOSIS — Z7982 Long term (current) use of aspirin: Secondary | ICD-10-CM | POA: Diagnosis not present

## 2019-02-04 DIAGNOSIS — N183 Chronic kidney disease, stage 3 (moderate): Secondary | ICD-10-CM | POA: Diagnosis not present

## 2019-02-04 DIAGNOSIS — Z79899 Other long term (current) drug therapy: Secondary | ICD-10-CM | POA: Diagnosis not present

## 2019-02-04 DIAGNOSIS — E785 Hyperlipidemia, unspecified: Secondary | ICD-10-CM | POA: Diagnosis not present

## 2019-02-04 DIAGNOSIS — Z96643 Presence of artificial hip joint, bilateral: Secondary | ICD-10-CM | POA: Diagnosis not present

## 2019-02-04 DIAGNOSIS — M1990 Unspecified osteoarthritis, unspecified site: Secondary | ICD-10-CM | POA: Diagnosis not present

## 2019-02-04 DIAGNOSIS — I2510 Atherosclerotic heart disease of native coronary artery without angina pectoris: Secondary | ICD-10-CM | POA: Diagnosis not present

## 2019-02-07 DIAGNOSIS — Z96643 Presence of artificial hip joint, bilateral: Secondary | ICD-10-CM | POA: Diagnosis not present

## 2019-02-07 DIAGNOSIS — D696 Thrombocytopenia, unspecified: Secondary | ICD-10-CM | POA: Diagnosis not present

## 2019-02-07 DIAGNOSIS — C3491 Malignant neoplasm of unspecified part of right bronchus or lung: Secondary | ICD-10-CM | POA: Diagnosis not present

## 2019-02-07 DIAGNOSIS — M1990 Unspecified osteoarthritis, unspecified site: Secondary | ICD-10-CM | POA: Diagnosis not present

## 2019-02-07 DIAGNOSIS — I2510 Atherosclerotic heart disease of native coronary artery without angina pectoris: Secondary | ICD-10-CM | POA: Diagnosis not present

## 2019-02-07 DIAGNOSIS — N183 Chronic kidney disease, stage 3 (moderate): Secondary | ICD-10-CM | POA: Diagnosis not present

## 2019-02-07 DIAGNOSIS — Z7982 Long term (current) use of aspirin: Secondary | ICD-10-CM | POA: Diagnosis not present

## 2019-02-07 DIAGNOSIS — D631 Anemia in chronic kidney disease: Secondary | ICD-10-CM | POA: Diagnosis not present

## 2019-02-07 DIAGNOSIS — I739 Peripheral vascular disease, unspecified: Secondary | ICD-10-CM | POA: Diagnosis not present

## 2019-02-07 DIAGNOSIS — Z720 Tobacco use: Secondary | ICD-10-CM | POA: Diagnosis not present

## 2019-02-07 DIAGNOSIS — E538 Deficiency of other specified B group vitamins: Secondary | ICD-10-CM | POA: Diagnosis not present

## 2019-02-07 DIAGNOSIS — E785 Hyperlipidemia, unspecified: Secondary | ICD-10-CM | POA: Diagnosis not present

## 2019-02-07 DIAGNOSIS — J439 Emphysema, unspecified: Secondary | ICD-10-CM | POA: Diagnosis not present

## 2019-02-07 DIAGNOSIS — E0590 Thyrotoxicosis, unspecified without thyrotoxic crisis or storm: Secondary | ICD-10-CM | POA: Diagnosis not present

## 2019-02-07 DIAGNOSIS — I129 Hypertensive chronic kidney disease with stage 1 through stage 4 chronic kidney disease, or unspecified chronic kidney disease: Secondary | ICD-10-CM | POA: Diagnosis not present

## 2019-02-07 DIAGNOSIS — Z8673 Personal history of transient ischemic attack (TIA), and cerebral infarction without residual deficits: Secondary | ICD-10-CM | POA: Diagnosis not present

## 2019-02-07 DIAGNOSIS — R2681 Unsteadiness on feet: Secondary | ICD-10-CM | POA: Diagnosis not present

## 2019-02-07 DIAGNOSIS — Z7902 Long term (current) use of antithrombotics/antiplatelets: Secondary | ICD-10-CM | POA: Diagnosis not present

## 2019-02-07 DIAGNOSIS — Z79899 Other long term (current) drug therapy: Secondary | ICD-10-CM | POA: Diagnosis not present

## 2019-02-10 DIAGNOSIS — G4710 Hypersomnia, unspecified: Secondary | ICD-10-CM | POA: Diagnosis not present

## 2019-02-10 DIAGNOSIS — C3490 Malignant neoplasm of unspecified part of unspecified bronchus or lung: Secondary | ICD-10-CM | POA: Diagnosis not present

## 2019-02-10 DIAGNOSIS — I129 Hypertensive chronic kidney disease with stage 1 through stage 4 chronic kidney disease, or unspecified chronic kidney disease: Secondary | ICD-10-CM | POA: Diagnosis not present

## 2019-02-10 DIAGNOSIS — N183 Chronic kidney disease, stage 3 (moderate): Secondary | ICD-10-CM | POA: Diagnosis not present

## 2019-02-10 DIAGNOSIS — D509 Iron deficiency anemia, unspecified: Secondary | ICD-10-CM | POA: Diagnosis not present

## 2019-02-11 ENCOUNTER — Telehealth: Payer: Self-pay | Admitting: Internal Medicine

## 2019-02-11 NOTE — Telephone Encounter (Signed)
[name] from [facility] went to Pt's home to do discharge yesterday, but Pt was at an appt and didn't come home or call [name], so the discharge was not able to happen in person / please advise

## 2019-02-12 NOTE — Telephone Encounter (Signed)
Noted  

## 2019-02-17 ENCOUNTER — Emergency Department (HOSPITAL_COMMUNITY)
Admission: EM | Admit: 2019-02-17 | Discharge: 2019-02-17 | Disposition: A | Discharge status: Home / Self Care | Payer: Medicare Other | Attending: Emergency Medicine | Admitting: Emergency Medicine

## 2019-02-17 ENCOUNTER — Emergency Department (HOSPITAL_COMMUNITY): Payer: Medicare Other

## 2019-02-17 ENCOUNTER — Other Ambulatory Visit: Payer: Self-pay

## 2019-02-17 DIAGNOSIS — Z85118 Personal history of other malignant neoplasm of bronchus and lung: Secondary | ICD-10-CM | POA: Diagnosis not present

## 2019-02-17 DIAGNOSIS — F0390 Unspecified dementia without behavioral disturbance: Secondary | ICD-10-CM | POA: Diagnosis not present

## 2019-02-17 DIAGNOSIS — I10 Essential (primary) hypertension: Secondary | ICD-10-CM | POA: Diagnosis not present

## 2019-02-17 DIAGNOSIS — Y998 Other external cause status: Secondary | ICD-10-CM | POA: Insufficient documentation

## 2019-02-17 DIAGNOSIS — S0093XA Contusion of unspecified part of head, initial encounter: Secondary | ICD-10-CM | POA: Diagnosis not present

## 2019-02-17 DIAGNOSIS — W01198A Fall on same level from slipping, tripping and stumbling with subsequent striking against other object, initial encounter: Secondary | ICD-10-CM | POA: Diagnosis not present

## 2019-02-17 DIAGNOSIS — I129 Hypertensive chronic kidney disease with stage 1 through stage 4 chronic kidney disease, or unspecified chronic kidney disease: Secondary | ICD-10-CM | POA: Diagnosis not present

## 2019-02-17 DIAGNOSIS — Y929 Unspecified place or not applicable: Secondary | ICD-10-CM | POA: Insufficient documentation

## 2019-02-17 DIAGNOSIS — S0990XA Unspecified injury of head, initial encounter: Secondary | ICD-10-CM | POA: Diagnosis not present

## 2019-02-17 DIAGNOSIS — S0083XA Contusion of other part of head, initial encounter: Secondary | ICD-10-CM | POA: Insufficient documentation

## 2019-02-17 DIAGNOSIS — N183 Chronic kidney disease, stage 3 (moderate): Secondary | ICD-10-CM | POA: Insufficient documentation

## 2019-02-17 DIAGNOSIS — Z7902 Long term (current) use of antithrombotics/antiplatelets: Secondary | ICD-10-CM | POA: Insufficient documentation

## 2019-02-17 DIAGNOSIS — Z96643 Presence of artificial hip joint, bilateral: Secondary | ICD-10-CM | POA: Insufficient documentation

## 2019-02-17 DIAGNOSIS — Z79899 Other long term (current) drug therapy: Secondary | ICD-10-CM | POA: Diagnosis not present

## 2019-02-17 DIAGNOSIS — Y9389 Activity, other specified: Secondary | ICD-10-CM | POA: Insufficient documentation

## 2019-02-17 DIAGNOSIS — W19XXXA Unspecified fall, initial encounter: Secondary | ICD-10-CM

## 2019-02-17 DIAGNOSIS — Z87891 Personal history of nicotine dependence: Secondary | ICD-10-CM | POA: Insufficient documentation

## 2019-02-17 DIAGNOSIS — I2510 Atherosclerotic heart disease of native coronary artery without angina pectoris: Secondary | ICD-10-CM | POA: Insufficient documentation

## 2019-02-17 LAB — URINALYSIS, ROUTINE W REFLEX MICROSCOPIC
Bacteria, UA: NONE SEEN
Bilirubin Urine: NEGATIVE
Glucose, UA: NEGATIVE mg/dL
Hgb urine dipstick: NEGATIVE
Ketones, ur: NEGATIVE mg/dL
Leukocytes,Ua: NEGATIVE
Nitrite: NEGATIVE
Protein, ur: 100 mg/dL — AB
Specific Gravity, Urine: 1.009 (ref 1.005–1.030)
pH: 7 (ref 5.0–8.0)

## 2019-02-17 LAB — CBC
HCT: 28.2 % — ABNORMAL LOW (ref 39.0–52.0)
Hemoglobin: 8.8 g/dL — ABNORMAL LOW (ref 13.0–17.0)
MCH: 29.2 pg (ref 26.0–34.0)
MCHC: 31.2 g/dL (ref 30.0–36.0)
MCV: 93.7 fL (ref 80.0–100.0)
Platelets: 130 10*3/uL — ABNORMAL LOW (ref 150–400)
RBC: 3.01 MIL/uL — ABNORMAL LOW (ref 4.22–5.81)
RDW: 13.9 % (ref 11.5–15.5)
WBC: 4.9 10*3/uL (ref 4.0–10.5)
nRBC: 0 % (ref 0.0–0.2)

## 2019-02-17 LAB — BASIC METABOLIC PANEL
Anion gap: 8 (ref 5–15)
BUN: 32 mg/dL — ABNORMAL HIGH (ref 8–23)
CO2: 24 mmol/L (ref 22–32)
Calcium: 9.2 mg/dL (ref 8.9–10.3)
Chloride: 109 mmol/L (ref 98–111)
Creatinine, Ser: 2.25 mg/dL — ABNORMAL HIGH (ref 0.61–1.24)
GFR calc Af Amer: 31 mL/min — ABNORMAL LOW (ref >60)
GFR calc non Af Amer: 27 mL/min — ABNORMAL LOW (ref >60)
Glucose, Bld: 114 mg/dL — ABNORMAL HIGH (ref 70–99)
Potassium: 4.3 mmol/L (ref 3.5–5.1)
Sodium: 141 mmol/L (ref 135–145)

## 2019-02-17 NOTE — ED Provider Notes (Signed)
[facility] Provider Note   CSN: [id number] Arrival date & time: 02/17/19  1558    History   Chief Complaint Chief Complaint  Patient presents with  . Fall    HPI [facility] is a 78 y.o. male.     The history is provided by the patient, the spouse and medical records. No language interpreter was used.  Fall   [facility] is a 78 y.o. male who presents to the Emergency Department complaining of fall. Level five caveat due to dementia. History is provided by the patient and his wife. He states that he was walking and had an even concrete before him and tripped, falling and striking his head as well as his arm. He reports mild headache. He mentioned nausea to triage nurse, denies any nausea on ED P assessment. No reports of recent illnesses. He does have a history of prior falls. He lives with his wife. He has not taken his evening medications. Symptoms are mild to moderate in nature. Past Medical History:  Diagnosis Date  . Cancer ([place])    lung and prostate cancer per wife  . 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 CKD (chronic kidney disease), stage III ([place])   . Coronary artery disease    a. DES to LAD and Cx 02/2012.  [name] Dementia ([place])   . Dysuria 10/10/2010  . Emphysema 04/01/2012   By CT chest, august 2013  . ERECTILE DYSFUNCTION 04/05/2007  . GERD (gastroesophageal reflux disease)   . HYPERLIPIDEMIA 07/30/2008  . HYPERSOMNIA 10/02/2007  . Impaired glucose tolerance 02/09/2011  . INSOMNIA-SLEEP DISORDER-UNSPEC 10/22/2009  . LOW BACK PAIN 04/05/2007  . Mild carotid artery disease ([place])    a. 1-39% bilaterally by duplex 11/2016.  . OSTEOARTHRITIS 04/05/2007  . PEPTIC ULCER DISEASE 04/05/2007  . PERIPHERAL EDEMA 10/22/2009  . PROSTATE CANCER, HX OF 04/05/2007  . RESTLESS LEG SYNDROME 05/15/2007  . RHINITIS, ALLERGIC NOS 05/15/2007  . Sinus bradycardia 10/24/2010   a. prior h/o HR 40s, clonidine stopped 05/2017 due to this.  [name]  Unspecified visual loss 07/15/2008  . UTI 10/24/2010    Patient Active Problem List   Diagnosis Date Noted  . Gait disorder 12/24/2018  . Weight loss 12/24/2018  . Thrombocytopenia ([place]) 05/28/2018  . Arm bruise 04/01/2018  . Carotid stenosis 01/28/2018  . Symptomatic anemia 11/26/2017  . Depression 06/20/2017  . Bradycardia/Falls 06/08/2017  . Head injury without skull fracture/S/p Fall with Lt Frontal Laceration 06/08/2017  . Sinus bradycardia 06/08/2017  . B12 deficiency 04/11/2017  . CKD (chronic kidney disease), stage III ([place]) 02/04/2016  . Snoring 01/21/2016  . Dementia ([place]) 10/08/2015  . Multiple bruises 10/08/2015  . Non-small cell carcinoma of right lung, stage 1 ([place]) 04/29/2015  . Lung nodule 03/22/2015  . Cholecystostomy drain infection ([place]) 01/25/2015  . Abdominal pain 01/18/2015  . Cholecystitis 01/18/2015  . Acute on chronic kidney failure ([place]) 01/18/2015  . Right upper quadrant pain   . Acute cholecystitis   . Renal cyst 12/31/2012  . Hearing loss 07/05/2012  . Lesion of nose 07/05/2012  . Anemia, unspecified 04/02/2012  . Pulmonary emphysema ([place]) 04/01/2012  . Hypertension 03/09/2012  . Pulmonary nodule, right 02/08/2012  . Mitral regurgitation 02/06/2012  . CAD (coronary artery disease) 01/04/2012  . Unspecified disorder of liver 12/18/2011  . Hyperthyroidism 12/18/2011  . Nonspecific abnormal findings on radiological and examination of lung field 12/18/2011  . Impaired glucose tolerance 02/09/2011  . Encounter for well adult  exam with abnormal findings 02/09/2011  . INSOMNIA-SLEEP DISORDER-UNSPEC 10/22/2009  . PERIPHERAL EDEMA 10/22/2009  . Hyperlipidemia 07/30/2008  . Peripheral vascular disease ([place]) 07/30/2008  . 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 Unspecified visual loss 07/15/2008  . SHOULDER PAIN, LEFT 01/22/2008  . Hypersomnia 10/02/2007  . RESTLESS LEG SYNDROME 05/15/2007  . RHINITIS, ALLERGIC NOS 05/15/2007  . ERECTILE DYSFUNCTION  04/05/2007  . PEPTIC ULCER DISEASE 04/05/2007  . Osteoarthritis 04/05/2007  . LOW BACK PAIN 04/05/2007  . PROSTATE CANCER, HX OF 04/05/2007    Past Surgical History:  Procedure Laterality Date  . CARDIAC CATHETERIZATION    . COLONOSCOPY N/A 11/28/2017   Procedure: COLONOSCOPY;  Surgeon: Yetta Flock, MD;  Location: [facility];  Service: Gastroenterology;  Laterality: N/A;  . ESOPHAGOGASTRODUODENOSCOPY N/A 11/28/2017   Procedure: ESOPHAGOGASTRODUODENOSCOPY (EGD);  Surgeon: Yetta Flock, MD;  Location: [facility];  Service: Gastroenterology;  Laterality: N/A;  . EYE SURGERY     right  . Inguinal herniorrhapy left  2002   x 2  . Left hip replacement    . LYMPH NODE DISSECTION Right 03/22/2015   Procedure: LYMPH NODE DISSECTION;  Surgeon: Melrose Nakayama, MD;  Location: [facility];  Service: Thoracic;  Laterality: Right;  . PERCUTANEOUS CORONARY STENT INTERVENTION (PCI-S) N/A 03/08/2012   Procedure: PERCUTANEOUS CORONARY STENT INTERVENTION (PCI-S);  Surgeon: Burnell Blanks, MD;  Location: [facility] CATH LAB;  Service: Cardiovascular;  Laterality: N/A;  . PROSTATECTOMY  2002   radical  . Right hip replacement  09/22/09  . ROTATOR CUFF REPAIR     left  . s/p ventral surgery  2009  . SEGMENTECOMY Right 03/22/2015   Procedure: RIGHT LOWER LOBE SUPERIOR SEGMENTECTOMY;  Surgeon: Melrose Nakayama, MD;  Location: [facility];  Service: Thoracic;  Laterality: Right;  [name] VIDEO ASSISTED THORACOSCOPY Right 03/22/2015   Procedure: RIGHT VIDEO ASSISTED THORACOSCOPY;  Surgeon: Melrose Nakayama, MD;  Location: [facility];  Service: Thoracic;  Laterality: Right;        Home Medications    Prior to Admission medications   Medication Sig Start Date End Date Taking? Authorizing Provider  abiraterone acetate (ZYTIGA) 500 MG tablet Take 1,000 mg by mouth daily. Take on an empty stomach 1 hour before or 2 hours after a meal.    [provider]  amLODipine (NORVASC) 10 MG tablet Take 1  tablet (10 mg total) by mouth daily. 12/06/18   Biagio Borg, MD  Ascorbic Acid (VITAMIN C) 500 MG CAPS Take 500 mg by mouth daily.     [provider]  aspirin 81 MG chewable tablet Chew 1 tablet (81 mg total) by mouth daily. Start from 4/22 12/03/17   Florencia Reasons, MD  citalopram (CELEXA) 20 MG tablet Take 1 tablet (20 mg total) by mouth daily. 01/01/19   Biagio Borg, MD  clopidogrel (PLAVIX) 75 MG tablet Take 1 tablet (75 mg total) by mouth daily. 12/06/18   Biagio Borg, MD  donepezil (ARICEPT) 5 MG tablet Take 1 tablet (5 mg total) by mouth at bedtime. -- Office visit needed for further refills 12/06/18   Biagio Borg, MD  ferrous sulfate 325 (65 FE) MG tablet Take 325 mg by mouth 3 (three) times daily. 04/04/18   [provider]  gabapentin (NEURONTIN) 300 MG capsule Take 1 capsule (300 mg total) by mouth 2 (two) times daily. 12/06/18   Biagio Borg, MD  hydrALAZINE (APRESOLINE) 25 MG tablet TAKE 1 TABLET (25 MG TOTAL) BY MOUTH  EVERY 8 (EIGHT) HOURS. 01/20/19   Biagio Borg, MD  isosorbide mononitrate (IMDUR) 30 MG 24 hr tablet Take 1 tablet (30 mg total) by mouth daily. -- Office visit needed for further refills 12/06/18   Biagio Borg, MD  lovastatin (MEVACOR) 40 MG tablet Take 1 tablet (40 mg total) by mouth at bedtime. -- Office visit needed for further refills 09/27/18   Biagio Borg, MD  Magnesium 500 MG TABS Take 500 mg by mouth.    [provider]  meclizine (ANTIVERT) 25 MG tablet Take 0.5-1 tablets (12.5-25 mg total) by mouth as needed for dizziness. TAKE 1/2 - 1 TABLET BY MOUTH THREE TIMES A DAY AS NEEDED FOR DIZINESS 01/01/19   Biagio Borg, MD  nitroGLYCERIN (NITROSTAT) 0.4 MG SL tablet Place 1 tablet (0.4 mg total) under the tongue every 5 (five) minutes as needed for chest pain. 07/29/18   Burnell Blanks, MD  pantoprazole (PROTONIX) 40 MG tablet TAKE 1 TABLET BY MOUTH EVERY DAY 01/01/19   Biagio Borg, MD  pramipexole (MIRAPEX) 0.5 MG tablet Take 1  tablet (0.5 mg total) by mouth 3 (three) times daily. 07/10/18   Biagio Borg, MD  predniSONE (DELTASONE) 5 MG tablet Take 5 mg daily with breakfast by mouth.    [provider]  thiamine (VITAMIN B-1) 50 MG tablet Take 1 tablet (50 mg total) by mouth daily. Patient taking differently: Take 100 mg by mouth daily.  11/28/17   Florencia Reasons, MD    Family History Family History  Problem Relation Age of Onset  . Heart attack Father 69       Smoker  . Heart attack Brother 16  . Lung cancer Sister   . Colon cancer Neg Hx     Social History Social History   Tobacco Use  . Smoking status: Former Smoker    Packs/day: 1.00    Years: 15.00    Pack years: 15.00    Types: Cigarettes    Quit date: 02/05/1977    Years since quitting: 42.0  . Smokeless tobacco: Never Used  Substance Use Topics  . Alcohol use: No    Alcohol/week: 1.0 standard drinks    Types: 1 Standard drinks or equivalent per week    Comment: RARE  . Drug use: No     Allergies   Amitriptyline hcl, Diphenhydramine hcl, Simvastatin, Clonidine derivatives, and Tylenol [acetaminophen]   Review of Systems Review of Systems  All other systems reviewed and are negative.    Physical Exam Updated Vital Signs BP (!) 207/74   Pulse (!) 56   Temp (!) 96.8 F (36 C) (Temporal)   Resp 14   Wt 62 kg   SpO2 99%   BMI 21.41 kg/m   Physical Exam Vitals signs and nursing note reviewed.  Constitutional:      Appearance: He is well-developed.  HENT:     Head: Normocephalic and atraumatic.  Cardiovascular:     Rate and Rhythm: Normal rate and regular rhythm.     Heart sounds: No murmur.  Pulmonary:     Effort: Pulmonary effort is normal. No respiratory distress.     Breath sounds: Normal breath sounds.  Abdominal:     Palpations: Abdomen is soft.     Tenderness: There is no abdominal tenderness. There is no guarding or rebound.  Musculoskeletal:        General: No tenderness.     Comments: Small skin tear to  the left  wrist with no local tenderness. Range of motion intact in the wrist bilaterally, shoulders bilaterally, hips bilaterally.  Skin:    General: Skin is warm and dry.  Neurological:     Mental Status: He is alert.     Comments: Mildly confused. Moves all extremities symmetrically.  Psychiatric:        Mood and Affect: Mood normal.        Behavior: Behavior normal.      ED Treatments / Results  Labs (all labs ordered are listed, but only abnormal results are displayed) Labs Reviewed  BASIC METABOLIC PANEL - Abnormal; Notable for the following components:      Result Value   Glucose, Bld 114 (*)    BUN 32 (*)    Creatinine, Ser 2.25 (*)    GFR calc non Af Amer 27 (*)    GFR calc Af Amer 31 (*)    All other components within normal limits  CBC - Abnormal; Notable for the following components:   RBC 3.01 (*)    Hemoglobin 8.8 (*)    HCT 28.2 (*)    Platelets 130 (*)    All other components within normal limits  URINALYSIS, ROUTINE W REFLEX MICROSCOPIC - Abnormal; Notable for the following components:   Color, Urine STRAW (*)    Protein, ur 100 (*)    All other components within normal limits    EKG None  Radiology Ct Head Wo Contrast  Result Date: 02/17/2019 CLINICAL DATA:  Status post fall. EXAM: CT HEAD WITHOUT CONTRAST TECHNIQUE: Contiguous axial images were obtained from the base of the skull through the vertex without intravenous contrast. COMPARISON:  Brain MRI 01/16/2019, head CT 06/08/2017 FINDINGS: Brain: No evidence of acute infarction, hemorrhage, hydrocephalus, extra-axial collection or mass lesion/mass effect. 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 Extensive white matter microangiopathy. Chronic brain parenchymal volume loss. Vascular: Extensive calcific atherosclerotic disease. Skull: Normal. Negative for fracture or focal lesion. Sinuses/Orbits: No acute finding. Other: None. IMPRESSION: 1. No acute  intracranial abnormality. 2. Atrophy, chronic microvascular disease. 3. 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 Electronically Signed   By: Fidela Salisbury M.D.   On: 02/17/2019 17:53    Procedures Procedures (including critical care time)  Medications Ordered in ED Medications - No data to display   Initial Impression / Assessment and Plan / ED Course  I have reviewed the triage vital signs and the nursing notes.  Pertinent labs & imaging results that were available during my care of the patient were reviewed by me and considered in my medical decision making (see chart for details).        Patient here for evaluation of injuries following a mechanical fall. CT scan with no evidence of significant intracranial injury. Labs demonstrate stable anemia, stable renal insufficiency. UA not consistent with UTI. Discussed with patient and wife home care following a fall. He is noted to be hypertensive on recheck in the emergency department, missed his evening blood pressure medications. Offered given a dose here in the emergency department versus taking his meds at home and wife prefers to take home medications. Discussed outpatient follow-up as well as return precautions.  Final Clinical Impressions(s) / ED Diagnoses   Final diagnoses:  Fall, initial encounter  Contusion of face, initial encounter    ED Discharge Orders    None       Quintella Reichert, MD 02/17/19 2333

## 2019-02-17 NOTE — ED Notes (Signed)
Pt ambulated in room with steady gait

## 2019-02-17 NOTE — ED Triage Notes (Signed)
Pt arrives POV w/ wife for eval of falls on blood thinners. Pt reports 1 fall on cement today, 2 falls in the last few days. Pt reports worsening HA w/ some nausea, no vomiting. Pt is confused at baseline w/ dementia. Wife at bedside. Small skin tear to L arm no obvious signs of trauma to head. Pt does remember episodes of falling, endorses dizziness prior to falls.

## 2019-02-20 ENCOUNTER — Other Ambulatory Visit (HOSPITAL_COMMUNITY): Payer: Self-pay | Admitting: Cardiovascular Disease

## 2019-02-20 ENCOUNTER — Other Ambulatory Visit: Payer: Self-pay

## 2019-02-20 ENCOUNTER — Ambulatory Visit (HOSPITAL_COMMUNITY)
Admission: RE | Admit: 2019-02-20 | Discharge: 2019-02-20 | Disposition: A | Discharge status: Home / Self Care | Payer: Medicare Other | Source: Ambulatory Visit | Attending: Cardiology | Admitting: Cardiology

## 2019-02-20 DIAGNOSIS — I6523 Occlusion and stenosis of bilateral carotid arteries: Secondary | ICD-10-CM | POA: Diagnosis not present

## 2019-02-20 NOTE — Patient Outreach (Signed)
[facility]) Care Management  02/20/2019  [facility] August 06, 1941 [id number]   Telephone Screen  Referral Date: 02/20/2019 Referral Source: [facility] Dept. Referral Reason: " fell on concrete while at pet store and then fell out of bed yesterday, using wife's cane, completed PT but still unsteady, wife trying to get member to gain weight" Insurance: [facility] Medicare   Outreach attempt # 1 to patient. Spoke with spouse(DPR on file). She voices that she handles patient's affairs due to his dementia. Spouse reports that patient just completed HHPT about a week ago. He was discharged as he "met goals" and made progress. She states that he fell earlier this week at the pet store. He was seen and evaluated in the ED. Spouse voices that patient told her that he fell again the other night. She did not witness it and does not know "how true the story is." She reports that patient is using a cane. She voices that caneused to belong to her. She is aware of how to obtain patient's own cane but voices they do not need another one as she has no use for her cane and patient can use it. She states that patient also has a walker but right now unable to "move around in the home with it." Spouse voices that she is in the process of "deep cleaning" their home and "stuff is everywhere." She states she is going to hire someone to help her clean it up. She voices she does not feel like patient needs more in home therapy. Discussed with spouse disease progression and that its normal for patient to have decreased appetite. Offered suggestions to help increase oral intake.  She states that she is able to manage patient's meds and denies any issues affording them. RN CM discussed further [facility] services. Spouse appreciative to know they are available but politely declined. She reports that she does not need any of those services at this time. She states that her goal is to keep patient in the home. She is aware that his  dementia will continue to worsen and progress. She voices that "maybe down the road" she will need help. She is agreeable to RN CM mailing out [facility] for future reference and is aware that she can call the office at any time.       Plan: RN CM will close case as no further interventions needed at this time.    Enzo Montgomery, RN,BSN,CCM [facility] Telephonic Care Management Coordinator Direct Phone: [phone] Toll Free: [phone] Fax: [phone]

## 2019-02-21 ENCOUNTER — Telehealth: Payer: Self-pay | Admitting: *Deleted

## 2019-02-21 DIAGNOSIS — I6523 Occlusion and stenosis of bilateral carotid arteries: Secondary | ICD-10-CM

## 2019-02-21 NOTE — Telephone Encounter (Signed)
-----   Message from Burnell Blanks, MD sent at 02/20/2019  1:50 PM EDT ----- Moderate carotid artery disease. Repeat one year. cdm

## 2019-02-21 NOTE — Telephone Encounter (Signed)
Results reviewed with pt's wife.

## 2019-02-24 ENCOUNTER — Ambulatory Visit: Payer: Medicare Other | Admitting: Diagnostic Neuroimaging

## 2019-02-24 ENCOUNTER — Ambulatory Visit (INDEPENDENT_AMBULATORY_CARE_PROVIDER_SITE_OTHER): Payer: Medicare Other | Admitting: Internal Medicine

## 2019-02-24 ENCOUNTER — Ambulatory Visit: Payer: Medicare Other

## 2019-02-24 ENCOUNTER — Other Ambulatory Visit: Payer: Self-pay

## 2019-02-24 ENCOUNTER — Encounter: Payer: Self-pay | Admitting: Internal Medicine

## 2019-02-24 ENCOUNTER — Ambulatory Visit (INDEPENDENT_AMBULATORY_CARE_PROVIDER_SITE_OTHER): Payer: Medicare Other | Admitting: Neurology

## 2019-02-24 ENCOUNTER — Encounter: Payer: Self-pay | Admitting: Neurology

## 2019-02-24 VITALS — BP 108/52 | HR 57 | Temp 98.6°F | Ht 66.0 in | Wt 127.0 lb

## 2019-02-24 VITALS — BP 124/86 | HR 63 | Temp 97.7°F | Ht 67.0 in | Wt 123.0 lb

## 2019-02-24 DIAGNOSIS — N183 Chronic kidney disease, stage 3 unspecified: Secondary | ICD-10-CM

## 2019-02-24 DIAGNOSIS — F0280 Dementia in other diseases classified elsewhere without behavioral disturbance: Secondary | ICD-10-CM

## 2019-02-24 DIAGNOSIS — G301 Alzheimer's disease with late onset: Secondary | ICD-10-CM | POA: Diagnosis not present

## 2019-02-24 DIAGNOSIS — R413 Other amnesia: Secondary | ICD-10-CM | POA: Diagnosis not present

## 2019-02-24 DIAGNOSIS — E538 Deficiency of other specified B group vitamins: Secondary | ICD-10-CM | POA: Diagnosis not present

## 2019-02-24 DIAGNOSIS — W19XXXD Unspecified fall, subsequent encounter: Secondary | ICD-10-CM

## 2019-02-24 DIAGNOSIS — I6300 Cerebral infarction due to thrombosis of unspecified precerebral artery: Secondary | ICD-10-CM

## 2019-02-24 DIAGNOSIS — I10 Essential (primary) hypertension: Secondary | ICD-10-CM

## 2019-02-24 DIAGNOSIS — H1010 Acute atopic conjunctivitis, unspecified eye: Secondary | ICD-10-CM | POA: Insufficient documentation

## 2019-02-24 DIAGNOSIS — R634 Abnormal weight loss: Secondary | ICD-10-CM

## 2019-02-24 MED ORDER — AZELASTINE HCL 0.05 % OP SOLN: 1.0000 [drp] | Freq: Two times a day (BID) | OPHTHALMIC | 12 refills | Status: DC

## 2019-02-24 MED ORDER — CYANOCOBALAMIN 1000 MCG/ML IJ SOLN
1000.0000 ug | Freq: Once | INTRAMUSCULAR | Status: AC
  Administered 2019-02-24: 1000 ug via INTRAMUSCULAR

## 2019-02-24 MED ORDER — MEMANTINE HCL 28 X 5 MG & 21 X 10 MG PO TABS: ORAL_TABLET | ORAL | 12 refills | Status: DC

## 2019-02-24 MED ORDER — METHYLPREDNISOLONE ACETATE 80 MG/ML IJ SUSP
80.0000 mg | Freq: Once | INTRAMUSCULAR | Status: AC
  Administered 2019-02-24: 80 mg via INTRAMUSCULAR

## 2019-02-24 MED ORDER — MECLIZINE HCL 25 MG PO TABS: 12.5000 mg | ORAL_TABLET | ORAL | 2 refills | Status: DC | PRN

## 2019-02-24 MED ORDER — AMLODIPINE BESYLATE 5 MG PO TABS: 5.0000 mg | ORAL_TABLET | Freq: Every day | ORAL | 3 refills | Status: DC

## 2019-02-24 NOTE — Assessment & Plan Note (Signed)
Mild to mod, for depomedrol IM 80, and optivar or zaditor prn,  to f/u any worsening symptoms or concerns

## 2019-02-24 NOTE — Assessment & Plan Note (Signed)
To continue renal f/u as planned

## 2019-02-24 NOTE — Assessment & Plan Note (Signed)
For BOOST asd ,  to f/u any worsening symptoms or concerns

## 2019-02-24 NOTE — Assessment & Plan Note (Addendum)
Possible overcontrolled, agree with decreased amlodipine to 5 mg daily  Note:  Total time for pt hx, exam, review of record with pt in the room, determination of diagnoses and plan for further eval and tx is > 40 min, with over 50% spent in coordination and counseling of patient including the differential dx, tx, further evaluation and other management of HTN, b12 deficiency, allergic conjunctivitis, ckd, wt loss

## 2019-02-24 NOTE — Progress Notes (Signed)
[facility] [address] [place]. [place] [address] [phone]       OFFICE CONSULT NOTE  [name] Date of Birth:  [birth year] Medical Record Number:  [id number]   Referring MD: Benay Pillow Reason for Referral: Dementia HPI: [name] is a 79 year old Caucasian male seen today for initial consultation visit for dementia.  He is accompanied by his wife.  History is obtained from them and review of electronic medical records, referral notes and have personally reviewed imaging films in PACS.  He has had gradual memory and cognitive decline for about a year.  Patient's wife is noticed that his short-term memory is quite poor and is quite forgetful.  He misplaces objects and his wife has to direct him to find them.  He also gets lost while driving.  He is mostly independent for his grooming and doing things for himself.  He has been started on Aricept 5 mg by primary care physician for several months with she has not noticed any benefit.  In fact she feels that he has lost appetite and has been losing weight.  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  The wife states that the patient vision difficulties improved and said no problems since then.  Review of electronic medical record shows that patient had carotid ultrasound done on 02/20/2019 which showed 1-49% right ICA and 40-59% left ICA stenosis.  Transthoracic echo done on 06/02/2017 showed normal ejection fraction without cardiac source of embolism.  Lab work on 12/24/2018 showed hemoglobin A1c of 5.2 and LDL of 28.  Vitamin B12 level was normal.  Patient is also having gait and balance difficulties and had frequent falls and bruises easily.  He also has his days and nights mixed up.  He is is unable to sleep well at night.  He dozes off easily during the day when he is not stimulated.  Patient also  has restless leg syndrome for which he takes gabapentin which seems to help him.  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  He had a follow-up CT scan done on 02/17/2019 following a fall and visit to the ER which showed no acute abnormality.  Lab work from that visit showed urine analysis to be unremarkable.  CBC showed low hematocrit of 28.2.  With normal MCV.  And BMP showed elevated creatinine of 2.25 ROS:   14 system review of systems is positive for memory loss, disorientation, getting lost, misplacing objects, frequent falls, balance difficulty, bruising, restless legs and all other systems negative  PMH:  Past Medical History:  Diagnosis Date   Cancer ([place])    lung and prostate cancer per wife   CEREBROVASCULAR ACCIDENT 07/23/2008   CKD (chronic kidney disease), stage III (HCC)    Coronary artery disease    a. DES to LAD and Cx 02/2012.   Dementia ([place])    Dysuria 10/10/2010   Emphysema 04/01/2012   By CT chest, august 2013   ERECTILE DYSFUNCTION 04/05/2007   GERD (gastroesophageal reflux disease)    HYPERLIPIDEMIA 07/30/2008   HYPERSOMNIA 10/02/2007   Impaired glucose tolerance 02/09/2011   INSOMNIA-SLEEP DISORDER-UNSPEC  10/22/2009   LOW BACK PAIN 04/05/2007   Mild carotid artery disease (HCC)    a. 1-39% bilaterally by duplex 11/2016.   OSTEOARTHRITIS 04/05/2007   PEPTIC ULCER DISEASE 04/05/2007   PERIPHERAL EDEMA 10/22/2009   PROSTATE CANCER, HX OF 04/05/2007   RESTLESS LEG SYNDROME 05/15/2007   RHINITIS, ALLERGIC NOS 05/15/2007   Sinus bradycardia 10/24/2010   a. prior h/o HR 40s, clonidine stopped 05/2017 due to this.   Unspecified visual loss 07/15/2008   UTI 10/24/2010    Social History:  Social History    Socioeconomic History   Marital status: Married    Spouse name: Not on file   Number of children: 2   Years of education: Not on file   Highest education level: Not on file  Occupational History   Occupation: truck driver-retired  Scientist, product/process development strain: Somewhat hard   Food insecurity    Worry: Sometimes true    Inability: Sometimes true   Transportation needs    Medical: No    Non-medical: No  Tobacco Use   Smoking status: Former Smoker    Packs/day: 1.00    Years: 15.00    Pack years: 15.00    Types: Cigarettes    Quit date: 02/05/1977    Years since quitting: 42.0   Smokeless tobacco: Never Used  Substance and Sexual Activity   Alcohol use: No    Alcohol/week: 1.0 standard drinks    Types: 1 Standard drinks or equivalent per week    Comment: RARE   Drug use: No   Sexual activity: Not Currently  Lifestyle   Physical activity    Days per week: 0 days    Minutes per session: 0 min   Stress: Not at all  Relationships   Social connections    Talks on phone: More than three times a week    Gets together: More than three times a week    Attends religious service: Never    Active member of club or organization: No    Attends meetings of clubs or organizations: Never    Relationship status: Married   Intimate partner violence    Fear of current or ex partner: No    Emotionally abused: No    Physically abused: No    Forced sexual activity: No  Other Topics Concern   Not on file  Social History Narrative   Not on file    Medications:   Current Outpatient Medications on File Prior to Visit  Medication Sig Dispense Refill   abiraterone acetate (ZYTIGA) 500 MG tablet Take 1,000 mg by mouth daily. Take on an empty stomach 1 hour before or 2 hours after a meal.     Ascorbic Acid (VITAMIN C) 500 MG CAPS Take 500 mg by mouth daily.      aspirin 81 MG chewable tablet Chew 1 tablet (81 mg total) by mouth daily. Start from 4/22 30  tablet 0   citalopram (CELEXA) 20 MG tablet Take 1 tablet (20 mg total) by mouth daily. 90 tablet 1   clopidogrel (PLAVIX) 75 MG tablet Take 1 tablet (75 mg total) by mouth daily. 90 tablet 0   ferrous sulfate 325 (65 FE) MG tablet Take 325 mg by mouth 3 (three) times daily.  1   gabapentin (NEURONTIN) 300 MG capsule Take 1 capsule (300 mg total) by mouth 2 (two) times daily. 180 capsule 0   hydrALAZINE (APRESOLINE) 25 MG tablet TAKE 1 TABLET (25 MG TOTAL) BY  MOUTH EVERY 8 (EIGHT) HOURS. 90 tablet 0   isosorbide mononitrate (IMDUR) 30 MG 24 hr tablet Take 1 tablet (30 mg total) by mouth daily. -- Office visit needed for further refills 90 tablet 0   lovastatin (MEVACOR) 40 MG tablet Take 1 tablet (40 mg total) by mouth at bedtime. -- Office visit needed for further refills 90 tablet 0   Magnesium 500 MG TABS Take 500 mg by mouth.     nitroGLYCERIN (NITROSTAT) 0.4 MG SL tablet Place 1 tablet (0.4 mg total) under the tongue every 5 (five) minutes as needed for chest pain. 25 tablet 3   pantoprazole (PROTONIX) 40 MG tablet TAKE 1 TABLET BY MOUTH EVERY DAY 90 tablet 1   pramipexole (MIRAPEX) 0.5 MG tablet Take 1 tablet (0.5 mg total) by mouth 3 (three) times daily. 270 tablet 3   predniSONE (DELTASONE) 5 MG tablet Take 5 mg daily with breakfast by mouth.     thiamine (VITAMIN B-1) 50 MG tablet Take 1 tablet (50 mg total) by mouth daily. (Patient taking differently: Take 100 mg by mouth daily. ) 30 tablet 0   No current facility-administered medications on file prior to visit.     Allergies:   Allergies  Allergen Reactions   Amitriptyline Hcl Other (See Comments)    REACTION: confusion   Diphenhydramine Hcl Other (See Comments)    Keeps him awake   Simvastatin Other (See Comments)    REACTION: leg pain   Clonidine Derivatives     Dizziness, falls, bradycardia   Tylenol [Acetaminophen] Other (See Comments)    Dr advised pt not to take due to finding a spot on pt's kidney     Physical Exam General: Frail elderly Caucasian male, seated, in no evident distress Head: head normocephalic and atraumatic.   Neck: supple with no carotid or supraclavicular bruits Cardiovascular: regular rate and rhythm, no murmurs Musculoskeletal: Mild kyphoscoliosis.  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 Skin:  no rash multiple bruises on his forearm left more than right  Vascular:  Normal pulses all extremities  Neurologic Exam Mental Status: Awake and fully alert. Oriented to place and time. Recent and remote memory poor attention span, concentration and fund of knowledge diminished.. Mood and affect appropriate.  Mini-Mental status exam scored 18 out of 30 with deficits in orientation recall ability to follow commands.  He was able to name only 8 animals that walk on 4 legs.  Clock drawing score was 3/4.  Geriatric depression scale he scored 3 not suggestive of depression. Cranial Nerves: Fundoscopic exam reveals sharp disc margins. Pupils equal, briskly reactive to light. Extraocular movements full without nystagmus. Visual fields full to confrontation. Hearing diminished bilaterally. Facial sensation intact. Face, tongue, palate moves normally and symmetrically.  Motor: Normal bulk and tone. Normal strength in all tested extremity muscles.  Right shoulder elevation limited due to mechanical restriction from frozen shoulder. Sensory.: intact to touch , pinprick , position and vibratory sensation.  Coordination: Rapid alternating movements normal in all extremities. Finger-to-nose and heel-to-shin performed accurately bilaterally. Gait and Station: Arises from chair without difficulty. Stance is normal. Gait demonstrates normal stride length and balance . Able to heel, toe and tandem walk with moderate difficulty.  Reflexes: 1+ and symmetric. Toes downgoing.   NIHSS  1 Modified Rankin  2   ASSESSMENT: 78 year old gentleman with memory and cognitive decline over  the last 1 year likely due to combination of Alzheimer's dementia mixed with vascular dementia given MRI findings of  multiple prior strokes.  Vascular risk factors of hypertension, hyperlipidemia and age and cerebrovascular disease.  He also has recent history of frequent falls likely due to combination of prior cerebellar and occipital infarcts as well as dementia and degenerative arthritis.    PLAN: I had a long discussion with the patient and his wife regarding his memory loss and cognitive impairment which likely represent mild dementia of mixed type vascular as well as Alzheimer's.  Patient has been on Aricept 5 mg for several months but is been losing weight and does not obtain substantial benefit hence recommend we discontinue it and instead try Namenda.  Patient was given a prescription for the starter pack and I discussed side effects.  Check screening labs for reversible causes of memory loss by checking TSH, RPR, HIV and check EEG.  Patient will continue aspirin 81 mg daily for stroke prevention and will check 30-day heart monitor for paroxysmal A. fib given multiple prior infarcts.  He will likely not be a good long-term anticoagulation candidate given his dementia as well as fall risk aggressive risk factor modification with strict control of hypertension with blood pressure goal below 130/90, lipids with LDL cholesterol goal below 70 mg percent and diabetes with hemoglobin A1c goal below 6.5%.  We also discussed cognitively compensation strategies and advised him to increase participation in mentally challenging activities like solving crossword puzzles, bridge and sudoku.  Patient also advised to limit his driving only with someone else besides him to familiar roads and daytime streets with less traffic.  Greater than 50% time during this 45-minute consultation visit were spent on counseling and coordination of care about his dementia and strokes and answering questions he will return for  follow-up in the future in 2 months or call earlier if necessary. Antony Contras, MD  [facility] [address] [place], [place] [address]  Phone [phone] Fax [phone] Note: This document was prepared with digital dictation and possible smart phrase technology. Any transcriptional errors that result from this process are unintentional.

## 2019-02-24 NOTE — Patient Instructions (Addendum)
OK to decrease the amlodipine to 5 mg per day (this is half of the 10 mg pill he is now taking); a new prescription for the 5 mg is sent to [facility]  Please take all new medication as prescribed - the Optivar (sent to [facility]) for eye allergies; or try OTC Zaditor if optivar is too expensive  You had the steroid shot today, and the B12 shot today  Please continue all other medications as before, and refills have been done if requested - the meclizine  Please have the pharmacy call with any other refills you may need.  Please continue your efforts at being more active, low cholesterol diet, and ok to use BOOST up to 3 cans per day to help the diet  You are otherwise up to date with prevention measures today.  Please keep your appointments with your specialists as you may have planned  Please use the Vaseline and Bandaids for the left arm skin tears until healed  Please return in 3 months, or sooner if needed

## 2019-02-24 NOTE — Progress Notes (Signed)
Subjective:    Patient ID: Victor Davis, male    DOB: 10/22/1940, 78 y.o.   MRN: [id number]  HPI    Here to f/u after recent fall forward to concrete, struck head and multiple skin tears to left arm.  Seen in ED 7/6 with ct head neg.   Does have persistent recurrent dizziness, needs meclizine refill, but not clear if this type of dizziness to account for the fall.  Wife does state nephrology asked her to decrease the amloipine from 10 to 5 mg due to lower BP, but she held off until seen here as well.  Does have several wks ongoing nasal and eye allergy symptoms with clearish congestion, itch and sneezing, without fever, pain, ST, cough, swelling or wheezing.  Also persistent wt loss, Denies worsening reflux, abd pain, dysphagia, n/v, bowel change or blood.  Wife asking about best supplement.  Also due for b12 IM today.  No other new complaints Past Medical History:  Diagnosis Date  . Cancer ([place])    lung and prostate cancer per wife  . 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 CKD (chronic kidney disease), stage III ([place])   . Coronary artery disease    a. DES to LAD and Cx 02/2012.  [name] Dementia ([place])   . Dysuria 10/10/2010  . Emphysema 04/01/2012   By CT chest, august 2013  . ERECTILE DYSFUNCTION 04/05/2007  . GERD (gastroesophageal reflux disease)   . HYPERLIPIDEMIA 07/30/2008  . HYPERSOMNIA 10/02/2007  . Impaired glucose tolerance 02/09/2011  . INSOMNIA-SLEEP DISORDER-UNSPEC 10/22/2009  . LOW BACK PAIN 04/05/2007  . Mild carotid artery disease ([place])    a. 1-39% bilaterally by duplex 11/2016.  . OSTEOARTHRITIS 04/05/2007  . PEPTIC ULCER DISEASE 04/05/2007  . PERIPHERAL EDEMA 10/22/2009  . PROSTATE CANCER, HX OF 04/05/2007  . RESTLESS LEG SYNDROME 05/15/2007  . RHINITIS, ALLERGIC NOS 05/15/2007  . Sinus bradycardia 10/24/2010   a. prior h/o HR 40s, clonidine stopped 05/2017 due to this.  [name] Unspecified visual loss 07/15/2008  . UTI 10/24/2010   Past Surgical History:  Procedure Laterality Date   . CARDIAC CATHETERIZATION    . COLONOSCOPY N/A 11/28/2017   Procedure: COLONOSCOPY;  Surgeon: Yetta Flock, MD;  Location: [facility];  Service: Gastroenterology;  Laterality: N/A;  . ESOPHAGOGASTRODUODENOSCOPY N/A 11/28/2017   Procedure: ESOPHAGOGASTRODUODENOSCOPY (EGD);  Surgeon: Yetta Flock, MD;  Location: [facility];  Service: Gastroenterology;  Laterality: N/A;  . EYE SURGERY     right  . Inguinal herniorrhapy left  2002   x 2  . Left hip replacement    . LYMPH NODE DISSECTION Right 03/22/2015   Procedure: LYMPH NODE DISSECTION;  Surgeon: Melrose Nakayama, MD;  Location: [facility];  Service: Thoracic;  Laterality: Right;  . PERCUTANEOUS CORONARY STENT INTERVENTION (PCI-S) N/A 03/08/2012   Procedure: PERCUTANEOUS CORONARY STENT INTERVENTION (PCI-S);  Surgeon: Burnell Blanks, MD;  Location: [facility] CATH LAB;  Service: Cardiovascular;  Laterality: N/A;  . PROSTATECTOMY  2002   radical  . Right hip replacement  09/22/09  . ROTATOR CUFF REPAIR     left  . s/p ventral surgery  2009  . SEGMENTECOMY Right 03/22/2015   Procedure: RIGHT LOWER LOBE SUPERIOR SEGMENTECTOMY;  Surgeon: Melrose Nakayama, MD;  Location: [facility];  Service: Thoracic;  Laterality: Right;  [name] VIDEO ASSISTED THORACOSCOPY Right 03/22/2015   Procedure: RIGHT VIDEO ASSISTED THORACOSCOPY;  Surgeon: Melrose Nakayama, MD;  Location: [facility];  Service: Thoracic;  Laterality: Right;  reports that he quit smoking about 42 years ago. His smoking use included cigarettes. He has a 15.00 pack-year smoking history. He has never used smokeless tobacco. He reports that he does not drink alcohol or use drugs. family history includes Heart attack (age of onset: 84) in his father; Heart attack (age of onset: 74) in his brother; Lung cancer in his sister. Allergies  Allergen Reactions  . Amitriptyline Hcl Other (See Comments)    REACTION: confusion  . Diphenhydramine Hcl Other (See Comments)    Keeps him awake  .  Simvastatin Other (See Comments)    REACTION: leg pain  . Clonidine Derivatives     Dizziness, falls, bradycardia  . Tylenol [Acetaminophen] Other (See Comments)    Dr advised pt not to take due to finding a spot on pt's kidney   Current Outpatient Medications on File Prior to Visit  Medication Sig Dispense Refill  . abiraterone acetate (ZYTIGA) 500 MG tablet Take 1,000 mg by mouth daily. Take on an empty stomach 1 hour before or 2 hours after a meal.    . Ascorbic Acid (VITAMIN C) 500 MG CAPS Take 500 mg by mouth daily.     [name] aspirin 81 MG chewable tablet Chew 1 tablet (81 mg total) by mouth daily. Start from 4/22 30 tablet 0  . citalopram (CELEXA) 20 MG tablet Take 1 tablet (20 mg total) by mouth daily. 90 tablet 1  . clopidogrel (PLAVIX) 75 MG tablet Take 1 tablet (75 mg total) by mouth daily. 90 tablet 0  . donepezil (ARICEPT) 5 MG tablet Take 1 tablet (5 mg total) by mouth at bedtime. -- Office visit needed for further refills 90 tablet 0  . ferrous sulfate 325 (65 FE) MG tablet Take 325 mg by mouth 3 (three) times daily.  1  . gabapentin (NEURONTIN) 300 MG capsule Take 1 capsule (300 mg total) by mouth 2 (two) times daily. 180 capsule 0  . hydrALAZINE (APRESOLINE) 25 MG tablet TAKE 1 TABLET (25 MG TOTAL) BY MOUTH EVERY 8 (EIGHT) HOURS. 90 tablet 0  . isosorbide mononitrate (IMDUR) 30 MG 24 hr tablet Take 1 tablet (30 mg total) by mouth daily. -- Office visit needed for further refills 90 tablet 0  . lovastatin (MEVACOR) 40 MG tablet Take 1 tablet (40 mg total) by mouth at bedtime. -- Office visit needed for further refills 90 tablet 0  . Magnesium 500 MG TABS Take 500 mg by mouth.    . nitroGLYCERIN (NITROSTAT) 0.4 MG SL tablet Place 1 tablet (0.4 mg total) under the tongue every 5 (five) minutes as needed for chest pain. 25 tablet 3  . pantoprazole (PROTONIX) 40 MG tablet TAKE 1 TABLET BY MOUTH EVERY DAY 90 tablet 1  . pramipexole (MIRAPEX) 0.5 MG tablet Take 1 tablet (0.5 mg total) by  mouth 3 (three) times daily. 270 tablet 3  . predniSONE (DELTASONE) 5 MG tablet Take 5 mg daily with breakfast by mouth.    . thiamine (VITAMIN B-1) 50 MG tablet Take 1 tablet (50 mg total) by mouth daily. (Patient taking differently: Take 100 mg by mouth daily. ) 30 tablet 0   No current facility-administered medications on file prior to visit.    Review of Systems  Constitutional: Negative for other unusual diaphoresis or sweats HENT: Negative for ear discharge or swelling Eyes: Negative for other worsening visual disturbances Respiratory: Negative for stridor or other swelling  Gastrointestinal: Negative for worsening distension or other blood Genitourinary: Negative for retention  or other urinary change Musculoskeletal: Negative for other MSK pain or swelling Skin: Negative for color change or other new lesions Neurological: Negative for worsening tremors and other numbness  Psychiatric/Behavioral: Negative for worsening agitation or other fatigue All other system neg per pt    Objective:   Physical Exam BP 124/86   Pulse 63   Temp 97.7 F (36.5 C) (Oral)   Ht 5\' 7"  (1.702 m)   Wt 123 lb (55.8 kg)   SpO2 96%   BMI 19.26 kg/m  VS noted,  Constitutional: Pt appears in NAD HENT: Head: NCAT.  Right Ear: External ear normal.  Left Ear: External ear normal.  Eyes: . Pupils are equal, round, and reactive to light. Conjunctivae and eyelids with erythema nontender and EOM are normal Nose: without d/c or deformity Neck: Neck supple. Gross normal ROM Cardiovascular: Normal rate and regular rhythm.   Pulmonary/Chest: Effort normal and breath sounds without rales or wheezing.  Abd:  Soft, NT, ND, + BS, no organomegaly Neurological: Pt is alert. At baseline orientation, motor grossly intact Skin: Skin is warm. No rashes, no LE edema, has several skin tears to arm non bleeding with bandaids Psychiatric: Pt behavior is normal without agitation  No other exam findings Lab Results   Component Value Date   WBC 4.9 02/17/2019   HGB 8.8 (L) 02/17/2019   HCT 28.2 (L) 02/17/2019   PLT 130 (L) 02/17/2019   GLUCOSE 114 (H) 02/17/2019   CHOL 78 12/24/2018   TRIG 99.0 12/24/2018   HDL 29.90 (L) 12/24/2018   LDLDIRECT 104.9 02/10/2011   LDLCALC 28 12/24/2018   ALT 5 12/24/2018   AST 12 12/24/2018   NA 141 02/17/2019   K 4.3 02/17/2019   CL 109 02/17/2019   CREATININE 2.25 (H) 02/17/2019   BUN 32 (H) 02/17/2019   CO2 24 02/17/2019   TSH 4.47 12/24/2018   PSA 45.16 (H) 12/24/2018   INR 0.99 11/27/2017   HGBA1C 5.2 12/24/2018   MICROALBUR 3.6 (H) 02/01/2010        Assessment & Plan:

## 2019-02-24 NOTE — Patient Instructions (Signed)
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  Patient has been on Aricept 5 mg for several months but is been losing weight and does not obtain substantial benefit hence recommend we discontinue it and instead try Namenda.  Patient was given a prescription for the starter pack and I discussed side effects.  Check screening labs for reversible causes of memory loss by checking TSH, RPR, HIV and check EEG.  Patient will continue aspirin 81 mg daily for stroke prevention and will check 30-day heart monitor for paroxysmal A. fib given multiple prior infarcts.  Aggressive risk factor modification with strict control of hypertension with blood pressure goal below 130/90, lipids with LDL cholesterol goal below 70 mg percent and diabetes with hemoglobin A1c goal below 6.5%.  We also discussed cognitively compensation strategies and advised him to increase participation in mentally challenging activities like solving crossword puzzles, bridge and sudoku.  Patient also advised to limit his driving only with someone else besides him to familiar roads and daytime streets with less traffic.  He will return for follow-up in the future in 2 months or call earlier if necessary.  Memantine Tablets What is this medicine? MEMANTINE (MEM an teen) is used to treat dementia caused by Alzheimer's disease. This medicine may be used for other purposes; ask your health care provider or pharmacist if you have questions. COMMON BRAND NAME(S): Namenda What should I tell my health care provider before I take this medicine? They need to know if you have any of these conditions:  difficulty passing urine  kidney disease  liver disease  seizures  an unusual or allergic reaction to memantine, other medicines, foods, dyes, or preservatives  pregnant or trying to get  pregnant  breast-feeding How should I use this medicine? Take this medicine by mouth with a glass of water. Follow the directions on the prescription label. You may take this medicine with or without food. Take your doses at regular intervals. Do not take your medicine more often than directed. Continue to take your medicine even if you feel better. Do not stop taking except on the advice of your doctor or health care professional. Talk to your pediatrician regarding the use of this medicine in children. Special care may be needed. Overdosage: If you think you have taken too much of this medicine contact a poison control center or emergency room at once. NOTE: This medicine is only for you. Do not share this medicine with others. What if I miss a dose? If you miss a dose, take it as soon as you can. If it is almost time for your next dose, take only that dose. Do not take double or extra doses. If you do not take your medicine for several days, contact your health care provider. Your dose may need to be changed. What may interact with this medicine?  acetazolamide  amantadine  cimetidine  dextromethorphan  dofetilide  hydrochlorothiazide  ketamine  metformin  methazolamide  quinidine  ranitidine  sodium bicarbonate  triamterene This list may not describe all possible interactions. Give your health care provider a list of all the medicines, herbs, non-prescription drugs, or dietary supplements you use. Also tell them if you smoke, drink alcohol, or use illegal drugs. Some items may interact with your medicine. What should I watch for while using this medicine? Visit your doctor or health care professional  for regular checks on your progress. Check with your doctor or health care professional if there is no improvement in your symptoms or if they get worse. You may get drowsy or dizzy. Do not drive, use machinery, or do anything that needs mental alertness until you know how  this drug affects you. Do not stand or sit up quickly, especially if you are an older patient. This reduces the risk of dizzy or fainting spells. Alcohol can make you more drowsy and dizzy. Avoid alcoholic drinks. What side effects may I notice from receiving this medicine? Side effects that you should report to your doctor or health care professional as soon as possible:  allergic reactions like skin rash, itching or hives, swelling of the face, lips, or tongue  agitation or a feeling of restlessness  depressed mood  dizziness  hallucinations  redness, blistering, peeling or loosening of the skin, including inside the mouth  seizures  vomiting Side effects that usually do not require medical attention (report to your doctor or health care professional if they continue or are bothersome):  constipation  diarrhea  headache  nausea  trouble sleeping This list may not describe all possible side effects. Call your doctor for medical advice about side effects. You may report side effects to FDA at 1-800-FDA-1088. Where should I keep my medicine? Keep out of the reach of children. Store at room temperature between 15 degrees and 30 degrees C (59 degrees and 86 degrees F). Throw away any unused medicine after the expiration date. NOTE: This sheet is a summary. It may not cover all possible information. If you have questions about this medicine, talk to your doctor, pharmacist, or health care provider.  2020 Elsevier/Gold Standard (2013-05-19 14:10:42)

## 2019-02-24 NOTE — Progress Notes (Signed)
r 

## 2019-02-24 NOTE — Assessment & Plan Note (Signed)
For b12 1000 mg IM today

## 2019-02-25 ENCOUNTER — Emergency Department (HOSPITAL_COMMUNITY)
Admission: EM | Admit: 2019-02-25 | Discharge: 2019-02-27 | Disposition: A | Discharge status: Skilled Nursing Facility | Payer: Medicare Other | Attending: Emergency Medicine | Admitting: Emergency Medicine

## 2019-02-25 ENCOUNTER — Other Ambulatory Visit: Payer: Self-pay

## 2019-02-25 ENCOUNTER — Emergency Department (HOSPITAL_COMMUNITY): Payer: Medicare Other

## 2019-02-25 ENCOUNTER — Encounter (HOSPITAL_COMMUNITY): Payer: Self-pay | Admitting: Emergency Medicine

## 2019-02-25 DIAGNOSIS — R262 Difficulty in walking, not elsewhere classified: Secondary | ICD-10-CM

## 2019-02-25 DIAGNOSIS — R05 Cough: Secondary | ICD-10-CM | POA: Diagnosis not present

## 2019-02-25 DIAGNOSIS — I2510 Atherosclerotic heart disease of native coronary artery without angina pectoris: Secondary | ICD-10-CM | POA: Insufficient documentation

## 2019-02-25 DIAGNOSIS — W19XXXA Unspecified fall, initial encounter: Secondary | ICD-10-CM | POA: Insufficient documentation

## 2019-02-25 DIAGNOSIS — Z7982 Long term (current) use of aspirin: Secondary | ICD-10-CM | POA: Insufficient documentation

## 2019-02-25 DIAGNOSIS — S79912A Unspecified injury of left hip, initial encounter: Secondary | ICD-10-CM | POA: Diagnosis not present

## 2019-02-25 DIAGNOSIS — F0390 Unspecified dementia without behavioral disturbance: Secondary | ICD-10-CM | POA: Diagnosis not present

## 2019-02-25 DIAGNOSIS — Z96642 Presence of left artificial hip joint: Secondary | ICD-10-CM | POA: Diagnosis not present

## 2019-02-25 DIAGNOSIS — Z79899 Other long term (current) drug therapy: Secondary | ICD-10-CM | POA: Insufficient documentation

## 2019-02-25 DIAGNOSIS — R269 Unspecified abnormalities of gait and mobility: Secondary | ICD-10-CM | POA: Diagnosis not present

## 2019-02-25 DIAGNOSIS — S199XXA Unspecified injury of neck, initial encounter: Secondary | ICD-10-CM | POA: Diagnosis not present

## 2019-02-25 DIAGNOSIS — N183 Chronic kidney disease, stage 3 (moderate): Secondary | ICD-10-CM | POA: Diagnosis not present

## 2019-02-25 DIAGNOSIS — S0990XA Unspecified injury of head, initial encounter: Secondary | ICD-10-CM | POA: Diagnosis not present

## 2019-02-25 DIAGNOSIS — Z87891 Personal history of nicotine dependence: Secondary | ICD-10-CM | POA: Insufficient documentation

## 2019-02-25 DIAGNOSIS — Z03818 Encounter for observation for suspected exposure to other biological agents ruled out: Secondary | ICD-10-CM | POA: Diagnosis not present

## 2019-02-25 DIAGNOSIS — I959 Hypotension, unspecified: Secondary | ICD-10-CM | POA: Diagnosis not present

## 2019-02-25 LAB — BASIC METABOLIC PANEL
Anion gap: 13 (ref 5–15)
BUN: 43 mg/dL — ABNORMAL HIGH (ref 8–23)
CO2: 24 mmol/L (ref 22–32)
Calcium: 9.5 mg/dL (ref 8.9–10.3)
Chloride: 106 mmol/L (ref 98–111)
Creatinine, Ser: 2.7 mg/dL — ABNORMAL HIGH (ref 0.61–1.24)
GFR calc Af Amer: 25 mL/min — ABNORMAL LOW (ref >60)
GFR calc non Af Amer: 21 mL/min — ABNORMAL LOW (ref >60)
Glucose, Bld: 94 mg/dL (ref 70–99)
Potassium: 3.5 mmol/L (ref 3.5–5.1)
Sodium: 143 mmol/L (ref 135–145)

## 2019-02-25 LAB — COMPREHENSIVE METABOLIC PANEL
ALT: 6 IU/L (ref 0–44)
AST: 16 IU/L (ref 0–40)
Albumin/Globulin Ratio: 1.7 (ref 1.2–2.2)
Albumin: 4 g/dL (ref 3.7–4.7)
Alkaline Phosphatase: 77 IU/L (ref 39–117)
BUN/Creatinine Ratio: 13 (ref 10–24)
BUN: 38 mg/dL — ABNORMAL HIGH (ref 8–27)
Bilirubin Total: 1.3 mg/dL — ABNORMAL HIGH (ref 0.0–1.2)
CO2: 21 mmol/L (ref 20–29)
Calcium: 9.7 mg/dL (ref 8.6–10.2)
Chloride: 103 mmol/L (ref 96–106)
Creatinine, Ser: 2.82 mg/dL — ABNORMAL HIGH (ref 0.76–1.27)
GFR calc Af Amer: 24 mL/min/{1.73_m2} — ABNORMAL LOW (ref >59)
GFR calc non Af Amer: 20 mL/min/{1.73_m2} — ABNORMAL LOW (ref >59)
Globulin, Total: 2.4 g/dL (ref 1.5–4.5)
Glucose: 104 mg/dL — ABNORMAL HIGH (ref 65–99)
Potassium: 4.6 mmol/L (ref 3.5–5.2)
Sodium: 145 mmol/L — ABNORMAL HIGH (ref 134–144)
Total Protein: 6.4 g/dL (ref 6.0–8.5)

## 2019-02-25 LAB — RPR: RPR Ser Ql: NONREACTIVE

## 2019-02-25 LAB — URINALYSIS, ROUTINE W REFLEX MICROSCOPIC
Bacteria, UA: NONE SEEN
Bilirubin Urine: NEGATIVE
Glucose, UA: NEGATIVE mg/dL
Ketones, ur: NEGATIVE mg/dL
Leukocytes,Ua: NEGATIVE
Nitrite: NEGATIVE
Protein, ur: NEGATIVE mg/dL
Specific Gravity, Urine: 1.005 (ref 1.005–1.030)
pH: 7 (ref 5.0–8.0)

## 2019-02-25 LAB — CBC WITH DIFFERENTIAL/PLATELET
Abs Immature Granulocytes: 0.02 10*3/uL (ref 0.00–0.07)
Basophils Absolute: 0 10*3/uL (ref 0.0–0.1)
Basophils Relative: 0 %
Eosinophils Absolute: 0 10*3/uL (ref 0.0–0.5)
Eosinophils Relative: 1 %
HCT: 29.6 % — ABNORMAL LOW (ref 39.0–52.0)
Hemoglobin: 9.2 g/dL — ABNORMAL LOW (ref 13.0–17.0)
Immature Granulocytes: 0 %
Lymphocytes Relative: 15 %
Lymphs Abs: 0.9 10*3/uL (ref 0.7–4.0)
MCH: 29.3 pg (ref 26.0–34.0)
MCHC: 31.1 g/dL (ref 30.0–36.0)
MCV: 94.3 fL (ref 80.0–100.0)
Monocytes Absolute: 0.6 10*3/uL (ref 0.1–1.0)
Monocytes Relative: 9 %
Neutro Abs: 4.5 10*3/uL (ref 1.7–7.7)
Neutrophils Relative %: 75 %
Platelets: 118 10*3/uL — ABNORMAL LOW (ref 150–400)
RBC: 3.14 MIL/uL — ABNORMAL LOW (ref 4.22–5.81)
RDW: 14 % (ref 11.5–15.5)
WBC: 6 10*3/uL (ref 4.0–10.5)
nRBC: 0 % (ref 0.0–0.2)

## 2019-02-25 LAB — TSH: TSH: 2.84 u[IU]/mL (ref 0.450–4.500)

## 2019-02-25 LAB — SARS CORONAVIRUS 2 BY RT PCR (HOSPITAL ORDER, PERFORMED IN ~~LOC~~ HOSPITAL LAB): SARS Coronavirus 2: NEGATIVE

## 2019-02-25 LAB — HIV ANTIBODY (ROUTINE TESTING W REFLEX): HIV Screen 4th Generation wRfx: NONREACTIVE

## 2019-02-25 MED ORDER — PRAMIPEXOLE DIHYDROCHLORIDE 0.25 MG PO TABS
0.5000 mg | ORAL_TABLET | Freq: Three times a day (TID) | ORAL | Status: DC
  Administered 2019-02-26 – 2019-02-27 (×3): 0.5 mg via ORAL
  Filled 2019-02-25 (×6): qty 2

## 2019-02-25 MED ORDER — CLOPIDOGREL BISULFATE 75 MG PO TABS
75.0000 mg | ORAL_TABLET | Freq: Every day | ORAL | Status: DC
  Administered 2019-02-25 – 2019-02-27 (×3): 75 mg via ORAL
  Filled 2019-02-25 (×3): qty 1

## 2019-02-25 MED ORDER — ABIRATERONE ACETATE 500 MG PO TABS: 1000.0000 mg | ORAL_TABLET | Freq: Every day | ORAL | Status: DC

## 2019-02-25 MED ORDER — AMLODIPINE BESYLATE 5 MG PO TABS
5.0000 mg | ORAL_TABLET | Freq: Every day | ORAL | Status: DC
  Administered 2019-02-25 – 2019-02-27 (×3): 5 mg via ORAL
  Filled 2019-02-25 (×3): qty 1

## 2019-02-25 MED ORDER — CITALOPRAM HYDROBROMIDE 10 MG PO TABS
20.0000 mg | ORAL_TABLET | Freq: Every day | ORAL | Status: DC
  Administered 2019-02-25 – 2019-02-27 (×3): 20 mg via ORAL
  Filled 2019-02-25 (×3): qty 2

## 2019-02-25 MED ORDER — ASPIRIN 81 MG PO CHEW
81.0000 mg | CHEWABLE_TABLET | Freq: Every day | ORAL | Status: DC
  Administered 2019-02-25 – 2019-02-27 (×3): 81 mg via ORAL
  Filled 2019-02-25 (×3): qty 1

## 2019-02-25 MED ORDER — ISOSORBIDE MONONITRATE ER 30 MG PO TB24
30.0000 mg | ORAL_TABLET | Freq: Every day | ORAL | Status: DC
  Administered 2019-02-25 – 2019-02-27 (×3): 30 mg via ORAL
  Filled 2019-02-25 (×3): qty 1

## 2019-02-25 NOTE — ED Notes (Signed)
Pt talking to his wife on hospital phone.

## 2019-02-25 NOTE — Progress Notes (Signed)
Received Victor Davis from the main ED at 2323 hrs via stretcher. He was oriented to his new environment. The sitter is at the bedside. He slept throughout except for bathroom visits.

## 2019-02-25 NOTE — ED Notes (Signed)
Pt's wife updated with pt's status.  Pt's wife asking to come back into the ED, she was informed of the no visitor policy and told she is not allowed to come back due to hospital policy.

## 2019-02-25 NOTE — Evaluation (Signed)
Physical Therapy Evaluation Patient Details Name: Victor Davis MRN: [id number] DOB: 10/25/1940 Today's Date: 02/25/2019   History of Present Illness  78 yo male admitted with falls, unsafe living conditions per chart. Hx of dementia. CVA, CKD, prostate ca, LBP  Clinical Impression  Evaluated in the ED. Pt required Min assist for mobility. He walked ~40 feet x 2 without a device but he was unsteady. He is generally weak and remains at risk for falls. He tolerated activity well. Recommend ST rehab at SNF.     Follow Up Recommendations SNF    Equipment Recommendations  None recommended by PT    Recommendations for Other Services       Precautions / Restrictions Precautions Precautions: Fall Restrictions Weight Bearing Restrictions: No      Mobility  Bed Mobility Overal bed mobility: Needs Assistance Bed Mobility: Supine to Sit;Sit to Supine     Supine to sit: HOB elevated;Supervision Sit to supine: HOB elevated;Supervision   General bed mobility comments: for safety.  Transfers Overall transfer level: Needs assistance   Transfers: Sit to/from Stand Sit to Stand: Min assist         General transfer comment: Assist to rise, stabilize. Unsteady.  Ambulation/Gait Ambulation/Gait assistance: Min assist Gait Distance (Feet): 40 Feet(x2) Assistive device: None Gait Pattern/deviations: Step-through pattern;Decreased stride length     General Gait Details: Assist to stabilize throughout distance. Cues for safety.  Stairs            Wheelchair Mobility    Modified Rankin (Stroke Patients Only)       Balance Overall balance assessment: History of Falls;Needs assistance           Standing balance-Leahy Scale: Fair                               Pertinent Vitals/Pain Pain Assessment: No/denies pain    Home Living Family/patient expects to be discharged to:: Private residence Living Arrangements: Spouse/significant other Available  Help at Discharge: Family Type of Home: House                Prior Function Level of Independence: Independent         Comments: but experiencing falls at home     Hand Dominance        Extremity/Trunk Assessment   Upper Extremity Assessment Upper Extremity Assessment: Generalized weakness    Lower Extremity Assessment Lower Extremity Assessment: Generalized weakness    Cervical / Trunk Assessment Cervical / Trunk Assessment: Kyphotic  Communication   Communication: No difficulties  Cognition Arousal/Alertness: Awake/alert Behavior During Therapy: WFL for tasks assessed/performed Overall Cognitive Status: History of cognitive impairments - at baseline                                        General Comments      Exercises     Assessment/Plan    PT Assessment Patient needs continued PT services  PT Problem List Decreased strength;Decreased mobility;Decreased balance;Decreased activity tolerance;Decreased cognition;Decreased safety awareness       PT Treatment Interventions DME instruction;Gait training;Therapeutic exercise;Therapeutic activities;Patient/family education;Balance training;Functional mobility training    PT Goals (Current goals can be found in the Care Plan section)  Acute Rehab PT Goals Patient Stated Goal: none stated PT Goal Formulation: Patient unable to participate in goal setting Time For Goal Achievement: 03/11/19  Potential to Achieve Goals: Fair    Frequency Min 2X/week   Barriers to discharge        Co-evaluation               AM-PAC PT "6 Clicks" Mobility  Outcome Measure Help needed turning from your back to your side while in a flat bed without using bedrails?: A Little Help needed moving from lying on your back to sitting on the side of a flat bed without using bedrails?: A Little Help needed moving to and from a bed to a chair (including a wheelchair)?: A Little Help needed standing up from a  chair using your arms (e.g., wheelchair or bedside chair)?: A Little Help needed to walk in hospital room?: A Little Help needed climbing 3-5 steps with a railing? : A Little 6 Click Score: 18    End of Session Equipment Utilized During Treatment: Gait belt Activity Tolerance: Patient tolerated treatment well Patient left: in bed;with call bell/phone within reach   PT Visit Diagnosis: Muscle weakness (generalized) (M62.81);Unsteadiness on feet (R26.81)    Time: 1040-1106 PT Time Calculation (min) (ACUTE ONLY): 26 min   Charges:   PT Evaluation $PT Eval Moderate Complexity: 1 Mod PT Treatments $Gait Training: 8-22 mins         Weston Anna, PT Acute Rehabilitation Services Pager: [phone] Office: [phone]

## 2019-02-25 NOTE — ED Notes (Signed)
Pt given graham crackers and orange juice until his meal tray arrives.

## 2019-02-25 NOTE — [place] FL2 (Signed)
[place] MEDICAID FL2 LEVEL OF CARE SCREENING TOOL     IDENTIFICATION  Patient Name: Victor Davis Birthdate: November 11, 1940 Sex: male Admission Date (Current Location): 02/25/2019  [facility] and [place] Number:  Herbalist and Address:  [facility],  [place] [address] [place]      Provider Number: [id number]  Attending Physician Name and Address:  Default, Provider, MD  Relative Name and Phone Number:       Current Level of Care: Hospital Recommended Level of Care: [place] Prior Approval Number:    Date Approved/Denied:   PASRR Number: [id number]  Discharge Plan: SNF    Current Diagnoses: Patient Active Problem List   Diagnosis Date Noted  . Allergic conjunctivitis 02/24/2019  . Gait disorder 12/24/2018  . Weight loss 12/24/2018  . Thrombocytopenia ([place]) 05/28/2018  . Arm bruise 04/01/2018  . Carotid stenosis 01/28/2018  . Symptomatic anemia 11/26/2017  . Depression 06/20/2017  . Bradycardia/Falls 06/08/2017  . Head injury without skull fracture/S/p Fall with Lt Frontal Laceration 06/08/2017  . Sinus bradycardia 06/08/2017  . B12 deficiency 04/11/2017  . CKD (chronic kidney disease), stage III ([place]) 02/04/2016  . Snoring 01/21/2016  . Dementia ([place]) 10/08/2015  . Multiple bruises 10/08/2015  . Non-small cell carcinoma of right lung, stage 1 ([place]) 04/29/2015  . Lung nodule 03/22/2015  . Cholecystostomy drain infection ([place]) 01/25/2015  . Abdominal pain 01/18/2015  . Cholecystitis 01/18/2015  . Acute on chronic kidney failure ([place]) 01/18/2015  . Right upper quadrant pain   . Acute cholecystitis   . Renal cyst 12/31/2012  . Hearing loss 07/05/2012  . Lesion of nose 07/05/2012  . Anemia, unspecified 04/02/2012  . Pulmonary emphysema ([place]) 04/01/2012  . Hypertension 03/09/2012  . Pulmonary nodule, right 02/08/2012  . Mitral regurgitation 02/06/2012  . CAD (coronary artery disease) 01/04/2012  . Unspecified  disorder of liver 12/18/2011  . Hyperthyroidism 12/18/2011  . Nonspecific abnormal findings on radiological and examination of lung field 12/18/2011  . Impaired glucose tolerance 02/09/2011  . Encounter for well adult exam with abnormal findings 02/09/2011  . INSOMNIA-SLEEP DISORDER-UNSPEC 10/22/2009  . PERIPHERAL EDEMA 10/22/2009  . Hyperlipidemia 07/30/2008  . Peripheral vascular disease ([place]) 07/30/2008  . 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 Unspecified visual loss 07/15/2008  . SHOULDER PAIN, LEFT 01/22/2008  . Hypersomnia 10/02/2007  . RESTLESS LEG SYNDROME 05/15/2007  . RHINITIS, ALLERGIC NOS 05/15/2007  . ERECTILE DYSFUNCTION 04/05/2007  . PEPTIC ULCER DISEASE 04/05/2007  . Osteoarthritis 04/05/2007  . LOW BACK PAIN 04/05/2007  . PROSTATE CANCER, HX OF 04/05/2007    Orientation RESPIRATION BLADDER Height & Weight     Self, Time, Place(patient has dementia)  Normal Continent Weight:   Height:  5\' 11"  (180.3 cm)  BEHAVIORAL SYMPTOMS/MOOD NEUROLOGICAL BOWEL NUTRITION STATUS      Continent Diet(Regular)  AMBULATORY STATUS COMMUNICATION OF NEEDS Skin   Limited Assist Verbally Normal                       Personal Care Assistance Level of Assistance  Bathing, Dressing, Feeding Bathing Assistance: Independent Feeding assistance: Independent Dressing Assistance: Independent     Functional Limitations Info  Sight, Hearing, Speech Sight Info: Adequate Hearing Info: Adequate Speech Info: Adequate    SPECIAL CARE FACTORS FREQUENCY  PT (By licensed PT), OT (By licensed OT)     PT Frequency: 5x weekly OT Frequency: 5x weekly            Contractures  Contractures Info: Not present    Additional Factors Info  Code Status, Allergies, Psychotropic Code Status Info: Full Allergies Info: Amitriptyline Hcl; Diphenhydramine Hcl; Simvastatin; Clonidine Derivatives; Tylenol/Acetaminophen Psychotropic Info: Celexa 20mg          Current Medications (02/25/2019):   This is the current hospital active medication list Current Facility-Administered Medications  Medication Dose Route Frequency Provider Last Rate Last Dose  . abiraterone acetate (ZYTIGA) tablet 1,000 mg  1,000 mg Oral Daily Hayden Rasmussen, MD      . amLODipine (NORVASC) tablet 5 mg  5 mg Oral Daily Hayden Rasmussen, MD      . aspirin chewable tablet 81 mg  81 mg Oral Daily Hayden Rasmussen, MD      . citalopram (CELEXA) tablet 20 mg  20 mg Oral Daily Hayden Rasmussen, MD      . clopidogrel (PLAVIX) tablet 75 mg  75 mg Oral Daily Hayden Rasmussen, MD      . isosorbide mononitrate (IMDUR) 24 hr tablet 30 mg  30 mg Oral Daily Hayden Rasmussen, MD      . pramipexole (MIRAPEX) tablet 0.5 mg  0.5 mg Oral TID Hayden Rasmussen, MD       Current Outpatient Medications  Medication Sig Dispense Refill  . abiraterone acetate (ZYTIGA) 500 MG tablet Take 1,000 mg by mouth daily. Take on an empty stomach 1 hour before or 2 hours after a meal.    . amLODipine (NORVASC) 5 MG tablet Take 1 tablet (5 mg total) by mouth daily. 90 tablet 3  . Ascorbic Acid (VITAMIN C) 500 MG CAPS Take 500 mg by mouth daily.     [name] aspirin 81 MG chewable tablet Chew 1 tablet (81 mg total) by mouth daily. Start from 4/22 30 tablet 0  . azelastine (OPTIVAR) 0.05 % ophthalmic solution Place 1 drop into both eyes 2 (two) times daily. 6 mL 12  . citalopram (CELEXA) 20 MG tablet Take 1 tablet (20 mg total) by mouth daily. 90 tablet 1  . clopidogrel (PLAVIX) 75 MG tablet Take 1 tablet (75 mg total) by mouth daily. 90 tablet 0  . ferrous sulfate 325 (65 FE) MG tablet Take 325 mg by mouth 3 (three) times daily.  1  . gabapentin (NEURONTIN) 300 MG capsule Take 1 capsule (300 mg total) by mouth 2 (two) times daily. 180 capsule 0  . hydrALAZINE (APRESOLINE) 25 MG tablet TAKE 1 TABLET (25 MG TOTAL) BY MOUTH EVERY 8 (EIGHT) HOURS. 90 tablet 0  . isosorbide mononitrate (IMDUR) 30 MG 24 hr tablet Take 1 tablet (30 mg total) by mouth  daily. -- Office visit needed for further refills 90 tablet 0  . lovastatin (MEVACOR) 40 MG tablet Take 1 tablet (40 mg total) by mouth at bedtime. -- Office visit needed for further refills 90 tablet 0  . Magnesium 500 MG TABS Take 500 mg by mouth.    . meclizine (ANTIVERT) 25 MG tablet Take 0.5-1 tablets (12.5-25 mg total) by mouth as needed for dizziness. TAKE 1/2 - 1 TABLET BY MOUTH THREE TIMES A DAY AS NEEDED FOR DIZINESS 30 tablet 2  . memantine (NAMENDA TITRATION PAK) tablet pack 5 mg/day for =1 week; 5 mg twice daily for =1 week; 15 mg/day given in 5 mg and 10 mg separated doses for =1 week; then 10 mg twice daily 49 tablet 12  . nitroGLYCERIN (NITROSTAT) 0.4 MG SL tablet Place 1 tablet (0.4 mg total) under the tongue every  5 (five) minutes as needed for chest pain. 25 tablet 3  . pantoprazole (PROTONIX) 40 MG tablet TAKE 1 TABLET BY MOUTH EVERY DAY 90 tablet 1  . pramipexole (MIRAPEX) 0.5 MG tablet Take 1 tablet (0.5 mg total) by mouth 3 (three) times daily. 270 tablet 3  . predniSONE (DELTASONE) 5 MG tablet Take 5 mg daily with breakfast by mouth.    . thiamine (VITAMIN B-1) 50 MG tablet Take 1 tablet (50 mg total) by mouth daily. (Patient taking differently: Take 100 mg by mouth daily. ) 30 tablet 0     Discharge Medications: Please see discharge summary for a list of discharge medications.  Relevant Imaging Results:  Relevant Lab Results:   Additional Information SS# [id number]  Janace Hoard, LCSW

## 2019-02-25 NOTE — ED Notes (Signed)
Pt's wife phone number is [phone]

## 2019-02-25 NOTE — ED Notes (Signed)
Patient's wife in ED lobby and refusing to leave. This RN had a talk with patient's wife explaining that we cannot have visitors in the lobby due to risk of COVID spread. Pt's wife angry with this RN but agreed to leave and requested a phone call. This RN made primary RN aware.

## 2019-02-25 NOTE — Progress Notes (Addendum)
1:22p CSW received call from patient's wife, [name], to update her on patient's care and SNF recommendation from PT. [name] reports patient has been to [facility] in past and this is her primary choice for placement. CSW explained that patient may get other bed offers as well and we will consider those if [facility] declines. [name] reports she would like to be updated as much as possible. She states she is upset that she is unable to be with her husband at this time due to [place] restrictions, but understands that he needs rehab as she is unable to pick him up when he falls.   1:19p CSW completed FL2 and faxed patient's information out for SNFs in [place]. CSW also attempted to contact patient's wife to update her on patient, but did not get an answer. CSW left HIPPA compliant VM for a call back.   Golden Circle, LCSW Transitions of Care Department [facility] ED [phone]

## 2019-02-25 NOTE — Discharge Planning (Signed)
[facility] notes consult for LTAC.  PT order placed, will await results of PT evaluation.

## 2019-02-25 NOTE — ED Notes (Signed)
Pt given meal tray.

## 2019-02-25 NOTE — ED Notes (Signed)
PT at bedside.

## 2019-02-25 NOTE — ED Triage Notes (Signed)
Pt's wife called EMS for lifting assistance, when they arrived, they had a cramped space to walk through about 8 inches, roaches, cats, space heaters throughout the house and a hospital bed with no linen. EMS assisted his up and he was very unstable so they transported the patient The patient doesn't remember falling or calling EMS

## 2019-02-25 NOTE — ED Notes (Signed)
Pt sleeping at this time.

## 2019-02-25 NOTE — ED Provider Notes (Signed)
[facility] DEPT Provider Note   CSN: [id number] Arrival date & time: 02/25/19  0046     History   Chief Complaint Chief Complaint  Patient presents with  . Fall    HPI Victor Davis is a 78 y.o. male.     The history is provided by the EMS personnel. The history is limited by the condition of the patient.  Fall This is a new problem. The current episode started less than 1 hour ago. The problem occurs constantly. The problem has not changed since onset.Pertinent negatives include no chest pain, no abdominal pain, no headaches and no shortness of breath. Nothing aggravates the symptoms. Nothing relieves the symptoms. He has tried nothing for the symptoms.  Was found down unable to get up and had bugs.    Past Medical History:  Diagnosis Date  . Cancer ([place])    lung and prostate cancer per wife  . 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 CKD (chronic kidney disease), stage III ([place])   . Coronary artery disease    a. DES to LAD and Cx 02/2012.  [name] Dementia ([place])   . Dysuria 10/10/2010  . Emphysema 04/01/2012   By CT chest, august 2013  . ERECTILE DYSFUNCTION 04/05/2007  . GERD (gastroesophageal reflux disease)   . HYPERLIPIDEMIA 07/30/2008  . HYPERSOMNIA 10/02/2007  . Impaired glucose tolerance 02/09/2011  . INSOMNIA-SLEEP DISORDER-UNSPEC 10/22/2009  . LOW BACK PAIN 04/05/2007  . Mild carotid artery disease ([place])    a. 1-39% bilaterally by duplex 11/2016.  . OSTEOARTHRITIS 04/05/2007  . PEPTIC ULCER DISEASE 04/05/2007  . PERIPHERAL EDEMA 10/22/2009  . PROSTATE CANCER, HX OF 04/05/2007  . RESTLESS LEG SYNDROME 05/15/2007  . RHINITIS, ALLERGIC NOS 05/15/2007  . Sinus bradycardia 10/24/2010   a. prior h/o HR 40s, clonidine stopped 05/2017 due to this.  [name] Unspecified visual loss 07/15/2008  . UTI 10/24/2010    Patient Active Problem List   Diagnosis Date Noted  . Allergic conjunctivitis 02/24/2019  . Gait disorder 12/24/2018  . Weight loss 12/24/2018   . Thrombocytopenia ([place]) 05/28/2018  . Arm bruise 04/01/2018  . Carotid stenosis 01/28/2018  . Symptomatic anemia 11/26/2017  . Depression 06/20/2017  . Bradycardia/Falls 06/08/2017  . Head injury without skull fracture/S/p Fall with Lt Frontal Laceration 06/08/2017  . Sinus bradycardia 06/08/2017  . B12 deficiency 04/11/2017  . CKD (chronic kidney disease), stage III ([place]) 02/04/2016  . Snoring 01/21/2016  . Dementia ([place]) 10/08/2015  . Multiple bruises 10/08/2015  . Non-small cell carcinoma of right lung, stage 1 ([place]) 04/29/2015  . Lung nodule 03/22/2015  . Cholecystostomy drain infection ([place]) 01/25/2015  . Abdominal pain 01/18/2015  . Cholecystitis 01/18/2015  . Acute on chronic kidney failure ([place]) 01/18/2015  . Right upper quadrant pain   . Acute cholecystitis   . Renal cyst 12/31/2012  . Hearing loss 07/05/2012  . Lesion of nose 07/05/2012  . Anemia, unspecified 04/02/2012  . Pulmonary emphysema ([place]) 04/01/2012  . Hypertension 03/09/2012  . Pulmonary nodule, right 02/08/2012  . Mitral regurgitation 02/06/2012  . CAD (coronary artery disease) 01/04/2012  . Unspecified disorder of liver 12/18/2011  . Hyperthyroidism 12/18/2011  . Nonspecific abnormal findings on radiological and examination of lung field 12/18/2011  . Impaired glucose tolerance 02/09/2011  . Encounter for well adult exam with abnormal findings 02/09/2011  . INSOMNIA-SLEEP DISORDER-UNSPEC 10/22/2009  . PERIPHERAL EDEMA 10/22/2009  . Hyperlipidemia 07/30/2008  . Peripheral vascular disease ([place]) 07/30/2008  . 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  Unspecified visual loss 07/15/2008  . SHOULDER PAIN, LEFT 01/22/2008  . Hypersomnia 10/02/2007  . RESTLESS LEG SYNDROME 05/15/2007  . RHINITIS, ALLERGIC NOS 05/15/2007  . ERECTILE DYSFUNCTION 04/05/2007  . PEPTIC ULCER DISEASE 04/05/2007  . Osteoarthritis 04/05/2007  . LOW BACK PAIN 04/05/2007  . PROSTATE CANCER, HX OF 04/05/2007    Past Surgical  History:  Procedure Laterality Date  . CARDIAC CATHETERIZATION    . COLONOSCOPY N/A 11/28/2017   Procedure: COLONOSCOPY;  Surgeon: Yetta Flock, MD;  Location: [facility] ENDOSCOPY;  Service: Gastroenterology;  Laterality: N/A;  . ESOPHAGOGASTRODUODENOSCOPY N/A 11/28/2017   Procedure: ESOPHAGOGASTRODUODENOSCOPY (EGD);  Surgeon: Yetta Flock, MD;  Location: [facility];  Service: Gastroenterology;  Laterality: N/A;  . EYE SURGERY     right  . Inguinal herniorrhapy left  2002   x 2  . Left hip replacement    . LYMPH NODE DISSECTION Right 03/22/2015   Procedure: LYMPH NODE DISSECTION;  Surgeon: Melrose Nakayama, MD;  Location: [facility];  Service: Thoracic;  Laterality: Right;  . PERCUTANEOUS CORONARY STENT INTERVENTION (PCI-S) N/A 03/08/2012   Procedure: PERCUTANEOUS CORONARY STENT INTERVENTION (PCI-S);  Surgeon: Burnell Blanks, MD;  Location: [facility] CATH LAB;  Service: Cardiovascular;  Laterality: N/A;  . PROSTATECTOMY  2002   radical  . Right hip replacement  09/22/09  . ROTATOR CUFF REPAIR     left  . s/p ventral surgery  2009  . SEGMENTECOMY Right 03/22/2015   Procedure: RIGHT LOWER LOBE SUPERIOR SEGMENTECTOMY;  Surgeon: Melrose Nakayama, MD;  Location: [facility];  Service: Thoracic;  Laterality: Right;  [name] VIDEO ASSISTED THORACOSCOPY Right 03/22/2015   Procedure: RIGHT VIDEO ASSISTED THORACOSCOPY;  Surgeon: Melrose Nakayama, MD;  Location: [facility];  Service: Thoracic;  Laterality: Right;        Home Medications    Prior to Admission medications   Medication Sig Start Date End Date Taking? Authorizing Provider  abiraterone acetate (ZYTIGA) 500 MG tablet Take 1,000 mg by mouth daily. Take on an empty stomach 1 hour before or 2 hours after a meal.    [provider]  amLODipine (NORVASC) 5 MG tablet Take 1 tablet (5 mg total) by mouth daily. 02/24/19 02/24/20  Biagio Borg, MD  Ascorbic Acid (VITAMIN C) 500 MG CAPS Take 500 mg by mouth daily.     [provider]  aspirin 81 MG chewable tablet Chew 1 tablet (81 mg total) by mouth daily. Start from 4/22 12/03/17   Florencia Reasons, MD  azelastine (OPTIVAR) 0.05 % ophthalmic solution Place 1 drop into both eyes 2 (two) times daily. 02/24/19   Biagio Borg, MD  citalopram (CELEXA) 20 MG tablet Take 1 tablet (20 mg total) by mouth daily. 01/01/19   Biagio Borg, MD  clopidogrel (PLAVIX) 75 MG tablet Take 1 tablet (75 mg total) by mouth daily. 12/06/18   Biagio Borg, MD  ferrous sulfate 325 (65 FE) MG tablet Take 325 mg by mouth 3 (three) times daily. 04/04/18   [provider]  gabapentin (NEURONTIN) 300 MG capsule Take 1 capsule (300 mg total) by mouth 2 (two) times daily. 12/06/18   Biagio Borg, MD  hydrALAZINE (APRESOLINE) 25 MG tablet TAKE 1 TABLET (25 MG TOTAL) BY MOUTH EVERY 8 (EIGHT) HOURS. 01/20/19   Biagio Borg, MD  isosorbide mononitrate (IMDUR) 30 MG 24 hr tablet Take 1 tablet (30 mg total) by mouth daily. -- Office visit needed for further refills 12/06/18  Biagio Borg, MD  lovastatin (MEVACOR) 40 MG tablet Take 1 tablet (40 mg total) by mouth at bedtime. -- Office visit needed for further refills 09/27/18   Biagio Borg, MD  Magnesium 500 MG TABS Take 500 mg by mouth.    [provider]  meclizine (ANTIVERT) 25 MG tablet Take 0.5-1 tablets (12.5-25 mg total) by mouth as needed for dizziness. TAKE 1/2 - 1 TABLET BY MOUTH THREE TIMES A DAY AS NEEDED FOR DIZINESS 02/24/19   Biagio Borg, MD  memantine [facility] TITRATION PAK) tablet pack 5 mg/day for =1 week; 5 mg twice daily for =1 week; 15 mg/day given in 5 mg and 10 mg separated doses for =1 week; then 10 mg twice daily 02/24/19   Garvin Fila, MD  nitroGLYCERIN (NITROSTAT) 0.4 MG SL tablet Place 1 tablet (0.4 mg total) under the tongue every 5 (five) minutes as needed for chest pain. 07/29/18   Burnell Blanks, MD  pantoprazole (PROTONIX) 40 MG tablet TAKE 1 TABLET BY MOUTH EVERY DAY 01/01/19   Biagio Borg, MD  pramipexole  (MIRAPEX) 0.5 MG tablet Take 1 tablet (0.5 mg total) by mouth 3 (three) times daily. 07/10/18   Biagio Borg, MD  predniSONE (DELTASONE) 5 MG tablet Take 5 mg daily with breakfast by mouth.    [provider]  thiamine (VITAMIN B-1) 50 MG tablet Take 1 tablet (50 mg total) by mouth daily. Patient taking differently: Take 100 mg by mouth daily.  11/28/17   Florencia Reasons, MD    Family History Family History  Problem Relation Age of Onset  . Heart attack Father 52       Smoker  . Heart attack Brother 63  . Lung cancer Sister   . Colon cancer Neg Hx     Social History Social History   Tobacco Use  . Smoking status: Former Smoker    Packs/day: 1.00    Years: 15.00    Pack years: 15.00    Types: Cigarettes    Quit date: 02/05/1977    Years since quitting: 42.0  . Smokeless tobacco: Never Used  Substance Use Topics  . Alcohol use: No    Alcohol/week: 1.0 standard drinks    Types: 1 Standard drinks or equivalent per week    Comment: RARE  . Drug use: No     Allergies   Amitriptyline hcl, Diphenhydramine hcl, Simvastatin, Clonidine derivatives, and Tylenol [acetaminophen]   Review of Systems Review of Systems  Unable to perform ROS: Dementia  Respiratory: Negative for shortness of breath.   Cardiovascular: Negative for chest pain.  Gastrointestinal: Negative for abdominal pain.  Neurological: Negative for headaches.     Physical Exam Updated Vital Signs BP (!) 180/73 (BP Location: Left Arm)   Pulse 74   Temp 97.9 F (36.6 C) (Oral)   Resp 15   Ht 5\' 11"  (1.803 m)   SpO2 94%   BMI 17.71 kg/m   Physical Exam Vitals signs and nursing note reviewed.  Constitutional:      General: He is not in acute distress. HENT:     Head: Normocephalic and atraumatic.     Nose: Nose normal.     Mouth/Throat:     Mouth: Mucous membranes are moist.     Pharynx: Oropharynx is clear.  Eyes:     Conjunctiva/sclera: Conjunctivae normal.     Pupils: Pupils are equal, round,  and reactive to light.  Neck:     Musculoskeletal:  Normal range of motion.  Cardiovascular:     Rate and Rhythm: Normal rate and regular rhythm.     Pulses: Normal pulses.     Heart sounds: Normal heart sounds.  Pulmonary:     Effort: Pulmonary effort is normal.     Breath sounds: Normal breath sounds. No wheezing or rales.  Abdominal:     General: Abdomen is flat. Bowel sounds are normal.     Tenderness: There is no abdominal tenderness.  Musculoskeletal: Normal range of motion.        General: No swelling or deformity.  Skin:    General: Skin is warm and dry.     Capillary Refill: Capillary refill takes less than 2 seconds.  Neurological:     General: No focal deficit present.     Mental Status: He is alert.     Deep Tendon Reflexes: Reflexes normal.  Psychiatric:        Mood and Affect: Mood normal.      ED Treatments / Results  Labs (all labs ordered are listed, but only abnormal results are displayed) Results for orders placed or performed during the hospital encounter of 02/25/19  CBC with Differential/Platelet  Result Value Ref Range   WBC 6.0 4.0 - 10.5 K/uL   RBC 3.14 (L) 4.22 - 5.81 MIL/uL   Hemoglobin 9.2 (L) 13.0 - 17.0 g/dL   HCT 29.6 (L) 39.0 - 52.0 %   MCV 94.3 80.0 - 100.0 fL   MCH 29.3 26.0 - 34.0 pg   MCHC 31.1 30.0 - 36.0 g/dL   RDW 14.0 11.5 - 15.5 %   Platelets 118 (L) 150 - 400 K/uL   nRBC 0.0 0.0 - 0.2 %   Neutrophils Relative % 75 %   Neutro Abs 4.5 1.7 - 7.7 K/uL   Lymphocytes Relative 15 %   Lymphs Abs 0.9 0.7 - 4.0 K/uL   Monocytes Relative 9 %   Monocytes Absolute 0.6 0.1 - 1.0 K/uL   Eosinophils Relative 1 %   Eosinophils Absolute 0.0 0.0 - 0.5 K/uL   Basophils Relative 0 %   Basophils Absolute 0.0 0.0 - 0.1 K/uL   Immature Granulocytes 0 %   Abs Immature Granulocytes 0.02 0.00 - 0.07 K/uL  Basic metabolic panel  Result Value Ref Range   Sodium 143 135 - 145 mmol/L   Potassium 3.5 3.5 - 5.1 mmol/L   Chloride 106 98 - 111 mmol/L    CO2 24 22 - 32 mmol/L   Glucose, Bld 94 70 - 99 mg/dL   BUN 43 (H) 8 - 23 mg/dL   Creatinine, Ser 2.70 (H) 0.61 - 1.24 mg/dL   Calcium 9.5 8.9 - 10.3 mg/dL   GFR calc non Af Amer 21 (L) >60 mL/min   GFR calc Af Amer 25 (L) >60 mL/min   Anion gap 13 5 - 15  Urinalysis, Routine w reflex microscopic  Result Value Ref Range   Color, Urine STRAW (A) YELLOW   APPearance CLEAR CLEAR   Specific Gravity, Urine 1.005 1.005 - 1.030   pH 7.0 5.0 - 8.0   Glucose, UA NEGATIVE NEGATIVE mg/dL   Hgb urine dipstick SMALL (A) NEGATIVE   Bilirubin Urine NEGATIVE NEGATIVE   Ketones, ur NEGATIVE NEGATIVE mg/dL   Protein, ur NEGATIVE NEGATIVE mg/dL   Nitrite NEGATIVE NEGATIVE   Leukocytes,Ua NEGATIVE NEGATIVE   RBC / HPF 0-5 0 - 5 RBC/hpf   WBC, UA 0-5 0 - 5 WBC/hpf   Bacteria, UA  NONE SEEN NONE SEEN   Dg Chest 2 View  Result Date: 02/25/2019 CLINICAL DATA:  78 y/o  M; cough, fall. EXAM: CHEST - 2 VIEW COMPARISON:  12/25/2018 chest radiograph FINDINGS: Stable normal cardiac silhouette. Coronary artery stent. Aortic atherosclerosis with calcification. Clear lungs. No pleural effusion or pneumothorax. No acute osseous abnormality is evident. IMPRESSION: No acute pulmonary process identified. Electronically Signed   By: Kristine Garbe M.D.   On: 02/25/2019 01:41   Ct Head Wo Contrast  Result Date: 02/25/2019 CLINICAL DATA:  78 y/o  M; status post fall. EXAM: CT HEAD WITHOUT CONTRAST CT CERVICAL SPINE WITHOUT CONTRAST TECHNIQUE: Multidetector CT imaging of the head and cervical spine was performed following the standard protocol without intravenous contrast. Multiplanar CT image reconstructions of the cervical spine were also generated. COMPARISON:  02/17/2019 CT head. FINDINGS: CT HEAD FINDINGS Brain: No evidence of acute infarction, hemorrhage, hydrocephalus, extra-axial collection or mass lesion/mass effect. Multiple stable small chronic infarctions and left-greater-than-right occipital lobes and  the left cerebellar hemisphere. Stable nonspecific white matter hypodensities compatible with chronic microvascular ischemic changes and volume loss of the brain. Vascular: Calcific atherosclerosis of internal carotid arteries and vertebral arteries. Skull: Normal. Negative for fracture or focal lesion. Sinuses/Orbits: Small sinus fluid level. Normal aeration of mastoid air cells. Right intra-ocular lens replacement. Other: None. CT CERVICAL SPINE FINDINGS Alignment: Normal. Skull base and vertebrae: No acute fracture. No primary bone lesion or focal pathologic process. Soft tissues and spinal canal: No prevertebral fluid or swelling. No visible canal hematoma. Disc levels: Multilevel disc and facet degenerative changes. C3-4 central disc protrusion contacts the anterior cord and results in moderate spinal canal stenosis. Uncovertebral and facet hypertrophy encroach on the neural foramen throughout the cervical spine. Upper chest: Calcific atherosclerosis of the aorta and carotid systems. Other: Negative. IMPRESSION: 1. No acute intracranial abnormality or displaced calvarial fracture. 2. Stable chronic microvascular ischemic changes and volume loss of the brain. 3. 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 4. No acute fracture or dislocation of cervical spine. 5. 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 Electronically Signed   By: Kristine Garbe M.D.   On: 02/25/2019 02:14   Ct Head Wo Contrast  Result Date: 02/17/2019 CLINICAL DATA:  Status post fall. EXAM: CT HEAD WITHOUT CONTRAST TECHNIQUE: Contiguous axial images were obtained from the base of the skull through the vertex without intravenous contrast. COMPARISON:  Brain MRI 01/16/2019, head CT 06/08/2017 FINDINGS: Brain: No evidence of acute infarction, hemorrhage, hydrocephalus,  extra-axial collection or mass lesion/mass effect. 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 Extensive white matter microangiopathy. Chronic brain parenchymal volume loss. Vascular: Extensive calcific atherosclerotic disease. Skull: Normal. Negative for fracture or focal lesion. Sinuses/Orbits: No acute finding. Other: None. IMPRESSION: 1. No acute intracranial abnormality. 2. Atrophy, chronic microvascular disease. 3. 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 Electronically Signed   By: Fidela Salisbury M.D.   On: 02/17/2019 17:53   Ct Cervical Spine Wo Contrast  Result Date: 02/25/2019 CLINICAL DATA:  78 y/o  M; status post fall. EXAM: CT HEAD WITHOUT CONTRAST CT CERVICAL SPINE WITHOUT CONTRAST TECHNIQUE: Multidetector CT imaging of the head and cervical spine was performed following the standard protocol without intravenous contrast. Multiplanar CT image reconstructions of the cervical spine were also generated. COMPARISON:  02/17/2019 CT head. FINDINGS: CT HEAD FINDINGS Brain: No evidence  of acute infarction, hemorrhage, hydrocephalus, extra-axial collection or mass lesion/mass effect. 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 Stable nonspecific white matter hypodensities compatible with chronic microvascular ischemic changes and volume loss of the brain. Vascular: Calcific atherosclerosis of internal carotid arteries and vertebral arteries. Skull: Normal. Negative for fracture or focal lesion. Sinuses/Orbits: Small sinus fluid level. Normal aeration of mastoid air cells. Right intra-ocular lens replacement. Other: None. CT CERVICAL SPINE FINDINGS Alignment: Normal. Skull base and vertebrae: No acute fracture. No primary bone lesion or focal pathologic process. Soft tissues and spinal canal: No prevertebral fluid or swelling. No visible  canal hematoma. Disc levels: Multilevel disc and facet degenerative changes. C3-4 central disc protrusion contacts the anterior cord and results in moderate spinal canal stenosis. Uncovertebral and facet hypertrophy encroach on the neural foramen throughout the cervical spine. Upper chest: Calcific atherosclerosis of the aorta and carotid systems. Other: Negative. IMPRESSION: 1. No acute intracranial abnormality or displaced calvarial fracture. 2. Stable chronic microvascular ischemic changes and volume loss of the brain. 3. 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 4. No acute fracture or dislocation of cervical spine. 5. 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 Electronically Signed   By: Kristine Garbe M.D.   On: 02/25/2019 02:14   Dg Hip Unilat W Or Wo Pelvis 2-3 Views Left  Result Date: 02/25/2019 CLINICAL DATA:  78 year old male status post fall at home. Prior hip replacement. EXAM: DG HIP (WITH OR WITHOUT PELVIS) 2-3V LEFT COMPARISON:  Pelvis radiograph 11/29/2009. CT Chest, Abdomen, and Pelvis 05/11/2016 FINDINGS: Bilateral hip arthroplasty redemonstrated. The left hip hardware appears intact and normally aligned. Chronic pelvic surgical clips and previous ventral abdominal hernia repair with mesh. Aortoiliac calcified atherosclerosis. No acute osseous abnormality identified. Negative visible bowel gas pattern. IMPRESSION: Chronic bilateral hip arthroplasty. No acute fracture or dislocation identified. Electronically Signed   By: Genevie Ann M.D.   On: 02/25/2019 01:45   Vas US Carotid  Result Date: 02/20/2019 Carotid Arterial Duplex Study Indications:       Carotid artery disease and Patient has history of falls. Last                    one occurred on 02/17/2019 with a head laceration. Patient does                    not recall any  specific cerebrovascular symptoms today. Risk Factors:      Hypertension, hyperlipidemia, past history of smoking,                    coronary artery disease, prior CVA, PAD. Other Factors:     Patient has dementia and a gait disorder. Comparison Study:  Previous carotid duplex on 02/05/2018 showed velocities in                    right proximal ICA 148/39 cm/s and left proximal ICA 212/49                    cm/s. The right subclavian artery 288 cm/s and left 191 cm/s. Performing Technologist: Salvadore Dom RVT, RDCS (AE), RDMS  Examination Guidelines: A complete evaluation includes B-mode imaging, spectral Doppler, color Doppler, and power Doppler as needed of all accessible portions of each vessel. Bilateral testing is considered an integral part  of a complete examination. Limited examinations for reoccurring indications may be performed as noted.  Right Carotid Findings: +----------+--------+--------+--------+---------------------+------------------+           PSV cm/sEDV cm/sStenosisDescribe             Comments           +----------+--------+--------+--------+---------------------+------------------+ CCA Prox  188     14                                   tortuous           +----------+--------+--------+--------+---------------------+------------------+ CCA Mid   66      10      <50%    heterogenous and                                                          calcific                                +----------+--------+--------+--------+---------------------+------------------+ CCA Distal115     11      <50%    heterogenous and                                                          calcific                                +----------+--------+--------+--------+---------------------+------------------+ ICA Prox  220     25      40-59%  heterogenous and     based on peak                                        calcific             systolic velocity,                                                         unable to                                                                 duplicate end                                                             diastolic velocity  from prior exam.   +----------+--------+--------+--------+---------------------+------------------+ ICA Mid   62      12                                                      +----------+--------+--------+--------+---------------------+------------------+ ICA Distal66      18                                                      +----------+--------+--------+--------+---------------------+------------------+ ECA       172     15              heterogenous and                                                          calcific                                +----------+--------+--------+--------+---------------------+------------------+ +----------+--------+-------+----------------+-------------------+           PSV cm/sEDV cmsDescribe        Arm Pressure (mmHG) +----------+--------+-------+----------------+-------------------+ [id number]            Multiphasic, [id number]                 +----------+--------+-------+----------------+-------------------+ +---------+--------+--+--------+-+---------+ VertebralPSV cm/s53EDV cm/s8Antegrade +---------+--------+--+--------+-+---------+  Left Carotid Findings: +----------+--------+--------+--------+--------------------+-------------------+           PSV cm/sEDV cm/sStenosisDescribe            Comments            +----------+--------+--------+--------+--------------------+-------------------+ CCA Prox  151     16                                                      +----------+--------+--------+--------+--------------------+-------------------+ CCA Mid                   <50%    heterogenous and                                                           calcific                                +----------+--------+--------+--------+--------------------+-------------------+ CCA Distal110     17      <50%    heterogenous and  calcific                                +----------+--------+--------+--------+--------------------+-------------------+ ICA Prox  327     50      40-59%  heterogenous and    High end range. The                                   calcific            peak systolic                                                             velocity has                                                              increased since                                                           prior exam.         +----------+--------+--------+--------+--------------------+-------------------+ ICA Mid   158     33                                  post stenotic                                                             turbulence          +----------+--------+--------+--------+--------------------+-------------------+ ICA Distal37      10                                                      +----------+--------+--------+--------+--------------------+-------------------+ ECA       219     15              heterogenous and                                                          calcific                                +----------+--------+--------+--------+--------------------+-------------------+ +----------+--------+--------+----------------+-------------------+ SubclavianPSV cm/sEDV cm/sDescribe  Arm Pressure (mmHG) +----------+--------+--------+----------------+-------------------+           208             Multiphasic, [id number]                 +----------+--------+--------+----------------+-------------------+ +---------+--------+--+--------+-+---------+ VertebralPSV cm/s33EDV  cm/s7Antegrade +---------+--------+--+--------+-+---------+  Summary: Right Carotid: Velocities in the right ICA are consistent with a 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                Non-hemodynamically significant plaque <50% noted in the CCA. Left Carotid: Velocities in the left ICA are consistent with a high end range               40-59% stenosis. Non-hemodynamically significant plaque noted in               the CCA. Vertebrals:  Bilateral vertebral arteries demonstrate antegrade flow. Subclavians: Normal flow hemodynamics were seen in bilateral subclavian              arteries. *See table(s) above for measurements and observations. Suggest follow up study in 12 months. Electronically signed by Ida Rogue MD on 02/20/2019 at 10:17:08 PM.    Final     Radiology Dg Chest 2 View  Result Date: 02/25/2019 CLINICAL DATA:  78 y/o  M; cough, fall. EXAM: CHEST - 2 VIEW COMPARISON:  12/25/2018 chest radiograph FINDINGS: Stable normal cardiac silhouette. Coronary artery stent. Aortic atherosclerosis with calcification. Clear lungs. No pleural effusion or pneumothorax. No acute osseous abnormality is evident. IMPRESSION: No acute pulmonary process identified. Electronically Signed   By: Kristine Garbe M.D.   On: 02/25/2019 01:41   Ct Head Wo Contrast  Result Date: 02/25/2019 CLINICAL DATA:  78 y/o  M; status post fall. EXAM: CT HEAD WITHOUT CONTRAST CT CERVICAL SPINE WITHOUT CONTRAST TECHNIQUE: Multidetector CT imaging of the head and cervical spine was performed following the standard protocol without intravenous contrast. Multiplanar CT image reconstructions of the cervical spine were also generated. COMPARISON:  02/17/2019 CT head. FINDINGS: CT HEAD FINDINGS Brain: No evidence of acute infarction, hemorrhage, hydrocephalus, extra-axial collection or mass lesion/mass effect. 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  Stable nonspecific white matter hypodensities compatible with chronic microvascular ischemic changes and volume loss of the brain. Vascular: Calcific atherosclerosis of internal carotid arteries and vertebral arteries. Skull: Normal. Negative for fracture or focal lesion. Sinuses/Orbits: Small sinus fluid level. Normal aeration of mastoid air cells. Right intra-ocular lens replacement. Other: None. CT CERVICAL SPINE FINDINGS Alignment: Normal. Skull base and vertebrae: No acute fracture. No primary bone lesion or focal pathologic process. Soft tissues and spinal canal: No prevertebral fluid or swelling. No visible canal hematoma. Disc levels: Multilevel disc and facet degenerative changes. C3-4 central disc protrusion contacts the anterior cord and results in moderate spinal canal stenosis. Uncovertebral and facet hypertrophy encroach on the neural foramen throughout the cervical spine. Upper chest: Calcific atherosclerosis of the aorta and carotid systems. Other: Negative. IMPRESSION: 1. No acute intracranial abnormality or displaced calvarial fracture. 2. Stable chronic microvascular ischemic changes and volume loss of the brain. 3. 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 4. No acute fracture or dislocation of cervical spine. 5. 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 Electronically Signed   By: Edgardo Roys.D.  On: 02/25/2019 02:14   Ct Cervical Spine Wo Contrast  Result Date: 02/25/2019 CLINICAL DATA:  78 y/o  M; status post fall. EXAM: CT HEAD WITHOUT CONTRAST CT CERVICAL SPINE WITHOUT CONTRAST TECHNIQUE: Multidetector CT imaging of the head and cervical spine was performed following the standard protocol without intravenous contrast. Multiplanar CT image reconstructions of the cervical spine were also generated. COMPARISON:   02/17/2019 CT head. FINDINGS: CT HEAD FINDINGS Brain: No evidence of acute infarction, hemorrhage, hydrocephalus, extra-axial collection or mass lesion/mass effect. 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 Stable nonspecific white matter hypodensities compatible with chronic microvascular ischemic changes and volume loss of the brain. Vascular: Calcific atherosclerosis of internal carotid arteries and vertebral arteries. Skull: Normal. Negative for fracture or focal lesion. Sinuses/Orbits: Small sinus fluid level. Normal aeration of mastoid air cells. Right intra-ocular lens replacement. Other: None. CT CERVICAL SPINE FINDINGS Alignment: Normal. Skull base and vertebrae: No acute fracture. No primary bone lesion or focal pathologic process. Soft tissues and spinal canal: No prevertebral fluid or swelling. No visible canal hematoma. Disc levels: Multilevel disc and facet degenerative changes. C3-4 central disc protrusion contacts the anterior cord and results in moderate spinal canal stenosis. Uncovertebral and facet hypertrophy encroach on the neural foramen throughout the cervical spine. Upper chest: Calcific atherosclerosis of the aorta and carotid systems. Other: Negative. IMPRESSION: 1. No acute intracranial abnormality or displaced calvarial fracture. 2. Stable chronic microvascular ischemic changes and volume loss of the brain. 3. 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 4. No acute fracture or dislocation of cervical spine. 5. 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 Electronically Signed   By: Kristine Garbe M.D.   On: 02/25/2019 02:14   Dg Hip Unilat W Or Wo Pelvis 2-3 Views Left  Result Date: 02/25/2019 CLINICAL DATA:  78 year old male status post fall  at home. Prior hip replacement. EXAM: DG HIP (WITH OR WITHOUT PELVIS) 2-3V LEFT COMPARISON:  Pelvis radiograph 11/29/2009. CT Chest, Abdomen, and Pelvis 05/11/2016 FINDINGS: Bilateral hip arthroplasty redemonstrated. The left hip hardware appears intact and normally aligned. Chronic pelvic surgical clips and previous ventral abdominal hernia repair with mesh. Aortoiliac calcified atherosclerosis. No acute osseous abnormality identified. Negative visible bowel gas pattern. IMPRESSION: Chronic bilateral hip arthroplasty. No acute fracture or dislocation identified. Electronically Signed   By: Genevie Ann M.D.   On: 02/25/2019 01:45    Procedures Procedures (including critical care time)  Resting comfortably in the room   Final Clinical Impressions(s) / ED Diagnoses   Final diagnoses:  Fall, initial encounter  Ambulatory dysfunction  Dementia without behavioral disturbance, unspecified dementia type ([place])    Meets no inpatient criteria.  Given ambulatory dysfunction and unsafe living conditions will need placement.  Social work consulted.  Signed out to Dr. Melina Copa pending disposition    Dayna Geurts, MD 02/25/19 [phone]

## 2019-02-25 NOTE — ED Notes (Signed)
Patient transported to radiology

## 2019-02-26 ENCOUNTER — Telehealth: Payer: Self-pay | Admitting: *Deleted

## 2019-02-26 MED ORDER — DOXYLAMINE SUCCINATE (SLEEP) 25 MG PO TABS
25.0000 mg | ORAL_TABLET | Freq: Once | ORAL | Status: AC
  Administered 2019-02-26: 25 mg via ORAL
  Filled 2019-02-26: qty 1

## 2019-02-26 MED ORDER — ABIRATERONE ACETATE 500 MG PO TABS: 1000.0000 mg | ORAL_TABLET | Freq: Every day | ORAL | Status: DC

## 2019-02-26 NOTE — Progress Notes (Signed)
CSW spoke with [place] at [facility] to determine if this patient could be accepted there. After review, Daleen Snook notified CSW that the patient was not accepted there. CSW reached out to patient's wife [name] to provide her with the two choices for placement which were [facility] and [facility]. Patient's wife was very upset by the news that her husband could not go to [place]. Wife reports that she has a broken elbow and may need to have surgery herself and that she is "having a nervous breakdown." CSW attempted to console [place] and offer support but she denied further conversation regarding her emotions. CSW mapped out the two locations and provided the distances to [place] from the home. CSW presented [name] with the two placement options and she reluctantly chose [facility]. [name] requested that CSW provide her contact information to the staff at [facility].  CSW contacted Juliann Pulse at [facility] to inform her of the patient's acceptance of the bed offer. Juliann Pulse will need to obtain insurance authorization for this patient. CSW will continue to assist with discharge until complete.  Madilyn Fireman, MSW, LCSW-A Clinical Social Worker [facility] [phone]

## 2019-02-26 NOTE — ED Notes (Signed)
Slept most of the pm, woke him for HS meds. Used bathroom with minimum assist, dressed into scrubs from a gown. He expressed his appreciation for how nice everyone has been to him. He states he is worried about his wife and he believes he is going home tomorrow. It is the plan for him to go to a facility tomorrow, not clear if that is the home he is referring to. Refused food, but accepted a drink. No behavior issues, resting.

## 2019-02-26 NOTE — ED Notes (Addendum)
Pt said that he just does not feel like a bath or shower now. Said that he will take one in the morning.  Pt knows that he has to have a shower to go to [place].

## 2019-02-26 NOTE — Telephone Encounter (Signed)
[employer] to ship a 30 day cardiac event monitor to your home. Instructions reviewed briefly as they are included in the monitor kit. 

## 2019-02-27 DIAGNOSIS — E785 Hyperlipidemia, unspecified: Secondary | ICD-10-CM | POA: Diagnosis not present

## 2019-02-27 DIAGNOSIS — I2510 Atherosclerotic heart disease of native coronary artery without angina pectoris: Secondary | ICD-10-CM | POA: Diagnosis not present

## 2019-02-27 DIAGNOSIS — N183 Chronic kidney disease, stage 3 (moderate): Secondary | ICD-10-CM | POA: Diagnosis not present

## 2019-02-27 DIAGNOSIS — Z79899 Other long term (current) drug therapy: Secondary | ICD-10-CM | POA: Diagnosis not present

## 2019-02-27 DIAGNOSIS — Z03818 Encounter for observation for suspected exposure to other biological agents ruled out: Secondary | ICD-10-CM | POA: Diagnosis not present

## 2019-02-27 DIAGNOSIS — I10 Essential (primary) hypertension: Secondary | ICD-10-CM | POA: Diagnosis not present

## 2019-02-27 DIAGNOSIS — R262 Difficulty in walking, not elsewhere classified: Secondary | ICD-10-CM | POA: Diagnosis not present

## 2019-02-27 DIAGNOSIS — M6281 Muscle weakness (generalized): Secondary | ICD-10-CM | POA: Diagnosis not present

## 2019-02-27 DIAGNOSIS — I639 Cerebral infarction, unspecified: Secondary | ICD-10-CM | POA: Diagnosis not present

## 2019-02-27 DIAGNOSIS — W19XXXD Unspecified fall, subsequent encounter: Secondary | ICD-10-CM | POA: Diagnosis not present

## 2019-02-27 DIAGNOSIS — Z7401 Bed confinement status: Secondary | ICD-10-CM | POA: Diagnosis not present

## 2019-02-27 DIAGNOSIS — Z96642 Presence of left artificial hip joint: Secondary | ICD-10-CM | POA: Diagnosis not present

## 2019-02-27 DIAGNOSIS — M5416 Radiculopathy, lumbar region: Secondary | ICD-10-CM | POA: Diagnosis not present

## 2019-02-27 DIAGNOSIS — D649 Anemia, unspecified: Secondary | ICD-10-CM | POA: Diagnosis not present

## 2019-02-27 DIAGNOSIS — M2550 Pain in unspecified joint: Secondary | ICD-10-CM | POA: Diagnosis not present

## 2019-02-27 DIAGNOSIS — Z87891 Personal history of nicotine dependence: Secondary | ICD-10-CM | POA: Diagnosis not present

## 2019-02-27 DIAGNOSIS — H1013 Acute atopic conjunctivitis, bilateral: Secondary | ICD-10-CM | POA: Diagnosis not present

## 2019-02-27 DIAGNOSIS — Z7982 Long term (current) use of aspirin: Secondary | ICD-10-CM | POA: Diagnosis not present

## 2019-02-27 DIAGNOSIS — W19XXXA Unspecified fall, initial encounter: Secondary | ICD-10-CM | POA: Diagnosis not present

## 2019-02-27 DIAGNOSIS — M1990 Unspecified osteoarthritis, unspecified site: Secondary | ICD-10-CM | POA: Diagnosis not present

## 2019-02-27 NOTE — ED Notes (Signed)
Complained of not being able to sleep this HS. Consulted Dr for Rx to help sleep. Unisom given as ordered.

## 2019-02-27 NOTE — ED Provider Notes (Signed)
Blood pressure (!) 189/74, pulse (!) 56, temperature 97.9 F (36.6 C), temperature source Oral, resp. rate 16, height 5\' 11"  (1.803 m), weight 57.6 kg, SpO2 97 %.  In short, Victor Davis is a 78 y.o. male with a chief complaint of Fall .  Refer to the original H&P for additional details.  Patient boarding in the emergency department awaiting placement.  Home medications have been restarted.  Lab work from initial presentation reviewed.  No indication for inpatient admission at this time.  Social work actively working on placement with family.  Continue to follow.    Margette Fast, MD 02/27/19 [phone]

## 2019-02-27 NOTE — ED Notes (Signed)
Attempted to call report x 2.  No answer.  Patient transported to facility by [facility].

## 2019-02-27 NOTE — TOC Transition Note (Signed)
Transition of Care [facility]) - CM/SW Discharge Note   Patient Details  Name: Victor Davis MRN: [id number] Date of Birth: November 16, 1940  Transition [facility]) CM/SW Contact:  Archie Endo, LCSW Phone Number: 02/27/2019, 11:06 AM   Clinical Narrative:    CSW spoke with patient's wife Victor Davis and son Victor Davis to inform them of discharge plan. Victor Davis requested to be contacted by the facility to coordinate a phone call and delivery of clothing to the patient, CSW will provide that information to the staff at [facility].  CSW informed Caren Griffins, RN of plan for discharge. CSW arranged for transportation to facility via [facility] at 1pm today. Patient is going to room 125B. The number to call for report is [phone].   Final next level of care: Skilled Nursing Facility Barriers to Discharge: No Barriers Identified   Patient Goals and CMS Choice   CMS Medicare.gov Compare Post Acute Care list provided to:: Patient Represenative (must comment)(Brenda, wife) Choice offered to / list presented to : Spouse  Discharge Placement              Patient chooses bed at: [facility] Patient to be transferred to facility by: [facility] Name of family member notified: Victor Davis, wife Patient and family notified of of transfer: 02/27/19  Discharge Plan and Services                             Social Determinants of Health (SDOH) Interventions     Readmission Risk Interventions No flowsheet data found.

## 2019-02-28 ENCOUNTER — Telehealth: Payer: Self-pay | Admitting: Internal Medicine

## 2019-02-28 DIAGNOSIS — I10 Essential (primary) hypertension: Secondary | ICD-10-CM | POA: Diagnosis not present

## 2019-02-28 DIAGNOSIS — M5416 Radiculopathy, lumbar region: Secondary | ICD-10-CM | POA: Diagnosis not present

## 2019-02-28 DIAGNOSIS — M6281 Muscle weakness (generalized): Secondary | ICD-10-CM | POA: Diagnosis not present

## 2019-02-28 DIAGNOSIS — R262 Difficulty in walking, not elsewhere classified: Secondary | ICD-10-CM | POA: Diagnosis not present

## 2019-02-28 NOTE — Telephone Encounter (Signed)
EMS should be called to transport pt to hospital from the facility as pt is not competant to maintain his health affairs and family is unable to support

## 2019-02-28 NOTE — Telephone Encounter (Signed)
Spouse called stating that patient had fallen at home.  States she was not able to assist him in getting up and patient could not get himself up.  States called EMS.   [name] then transferred patient to [facility] for rehab due to patients current living conditions. Spouse stated that she visited the patient through the outside window of the facility yesterday.  States that after she left Victor Davis tried to crawl out the window.  States she informed the facility that the patient has not been away from her for long periods of time and did not know how patient would act.   Spouse states that she received a call this morning from the facility to pick up Victor Davis for discharge.  She was currently at an appointment for an evaluation on a broken elbow.   She was concerned about taking the patient home due to not being able to care for him appropriately.   I did  reach out to the Administrator at [facility].  He informed me that the patient was very confused and addiment at the same time to go home with his family.  States the patient broke the seal on his window and did crawl out the window yesterday.  States has a Actuary with the patient currently.  States does not have the resources to provide one on one care around the clock.  States is sending patient home with as many home health orders as possible.   Spouse would like to know if there are any other care recommendations for patient in this situation.

## 2019-03-01 ENCOUNTER — Other Ambulatory Visit: Payer: Self-pay | Admitting: Internal Medicine

## 2019-03-02 ENCOUNTER — Other Ambulatory Visit: Payer: Self-pay | Admitting: Internal Medicine

## 2019-03-03 NOTE — Telephone Encounter (Signed)
Pt's wife has been informed and expressed understanding but she left me with the impression that she may not take him to the ED since he is at home now. I told her that the hospital recommendation came from PCP and that he urges her to call EMS to transport pt to the hospital.

## 2019-03-04 ENCOUNTER — Telehealth: Payer: Self-pay | Admitting: Neurology

## 2019-03-04 NOTE — Telephone Encounter (Signed)
Luke from [facility] is calling in requesting clarification for memantine [facility] TITRATION PAK) tablet pack  CB# [phone]  Ref # [id number]

## 2019-03-04 NOTE — Telephone Encounter (Signed)
I called [facility] at [phone]. Chor(phamacist) wanted clarification on the  namenda dosage and ongoing dosage. I verified per med rx and office note pt will be on titration pack namenda  and than if tolerated 10mg  bid of namenda generic ongoing.

## 2019-03-05 ENCOUNTER — Other Ambulatory Visit: Payer: Self-pay

## 2019-03-05 ENCOUNTER — Ambulatory Visit (INDEPENDENT_AMBULATORY_CARE_PROVIDER_SITE_OTHER): Payer: Medicare Other

## 2019-03-05 ENCOUNTER — Telehealth: Payer: Self-pay

## 2019-03-05 DIAGNOSIS — R413 Other amnesia: Secondary | ICD-10-CM

## 2019-03-05 DIAGNOSIS — R410 Disorientation, unspecified: Secondary | ICD-10-CM

## 2019-03-05 NOTE — Telephone Encounter (Signed)
Labs were forward to pts PCP by Dr.SEthi electronically.

## 2019-03-05 NOTE — Telephone Encounter (Signed)
I called pts wife about her husband labs. I stated the memory loss was satisfactory. The blood chemistries shows slight worsening of the kidney function compared to test 11 days ago. He wants pt to see PCP for further evaluation. The wife stated pt does see a nephrologist for his kidney function.I stated a copy will be forward to his PCP. The wife verbalized understanding.  ------

## 2019-03-05 NOTE — Telephone Encounter (Signed)
-----   Message from Garvin Fila, MD sent at 02/28/2019  2:37 PM EDT ----- Mitchell Heir advise the patient that all lab work for reversible causes of memory loss was satisfactory.  Blood chemistries show slight worsening of his kidney function compared to the test 11 days ago and advised him to talk to his primary care physician for further advice on this matter.

## 2019-03-11 ENCOUNTER — Other Ambulatory Visit: Payer: Self-pay | Admitting: Internal Medicine

## 2019-03-12 ENCOUNTER — Other Ambulatory Visit: Payer: Self-pay | Admitting: Internal Medicine

## 2019-03-12 ENCOUNTER — Telehealth: Payer: Self-pay

## 2019-03-12 NOTE — Telephone Encounter (Signed)
-----   Message from Garvin Fila, MD sent at 03/11/2019 10:58 AM EDT ----- Mitchell Heir inform the patient that EEG study showed mild degree of slowing of brain activity which is likely compatible with patient's known diagnosis of dementia and memory loss.  No definite seizure activity or any worrisome findings were noted.

## 2019-03-12 NOTE — Telephone Encounter (Signed)
Notes recorded by Marval Regal, RN on 03/12/2019 at 1:24 PM EDT  I called pts wife [name] that her husband EEG showed mild slowing of the brain activity. Its likely compatible with pts diagnosis of dementia and memory loss. No definite seizure or worrisome findings. The wife verbalized understanding.  ------

## 2019-03-17 ENCOUNTER — Other Ambulatory Visit: Payer: Self-pay | Admitting: Urology

## 2019-03-17 ENCOUNTER — Other Ambulatory Visit (HOSPITAL_COMMUNITY): Payer: Self-pay | Admitting: Urology

## 2019-03-17 DIAGNOSIS — C61 Malignant neoplasm of prostate: Secondary | ICD-10-CM

## 2019-03-24 ENCOUNTER — Other Ambulatory Visit: Payer: Self-pay | Admitting: Internal Medicine

## 2019-03-24 ENCOUNTER — Other Ambulatory Visit: Payer: Self-pay

## 2019-03-24 ENCOUNTER — Ambulatory Visit (INDEPENDENT_AMBULATORY_CARE_PROVIDER_SITE_OTHER): Payer: Medicare Other

## 2019-03-24 DIAGNOSIS — E538 Deficiency of other specified B group vitamins: Secondary | ICD-10-CM | POA: Diagnosis not present

## 2019-03-24 MED ORDER — CYANOCOBALAMIN 1000 MCG/ML IJ SOLN
1000.0000 ug | Freq: Once | INTRAMUSCULAR | Status: AC
  Administered 2019-03-24: 12:00:00 1000 ug via INTRAMUSCULAR

## 2019-03-24 NOTE — Progress Notes (Signed)
Medical screening examination/treatment/procedure(s) were performed by non-physician practitioner and as supervising physician I was immediately available for consultation/collaboration. I agree with above. James John, MD   

## 2019-03-26 ENCOUNTER — Other Ambulatory Visit: Payer: Self-pay | Admitting: Internal Medicine

## 2019-03-27 ENCOUNTER — Ambulatory Visit: Payer: Medicare Other

## 2019-04-01 ENCOUNTER — Encounter (HOSPITAL_COMMUNITY)
Admission: RE | Admit: 2019-04-01 | Discharge: 2019-04-01 | Disposition: A | Discharge status: Home / Self Care | Payer: Medicare Other | Source: Ambulatory Visit | Attending: Urology | Admitting: Urology

## 2019-04-01 ENCOUNTER — Other Ambulatory Visit: Payer: Self-pay

## 2019-04-01 ENCOUNTER — Ambulatory Visit (HOSPITAL_COMMUNITY): Payer: Medicare Other

## 2019-04-01 DIAGNOSIS — C61 Malignant neoplasm of prostate: Secondary | ICD-10-CM | POA: Diagnosis present

## 2019-04-07 ENCOUNTER — Ambulatory Visit: Payer: Medicare Other | Admitting: Cardiovascular Disease

## 2019-04-07 NOTE — Progress Notes (Deleted)
No chief complaint on file.    History of Present Illness: 78 yo male with history of CAD, CVA, HTN, HLD, PAD, prostate CA, lung cancer, sinus bradycardia, anemia, CKD and dementia who is here today for cardiac follow up. Cardiac cath July 2013 with severe LAD and Circumflex disease. 2 drug eluting stents were placed in the LAD and 2 drug eluting stents were placed in the Circumflex. He has had lung cancer and prostate cancer. He has moderate carotid artery disease. Possible syncopal event October 2016. Cardiac event monitor showed rare PVCs but no other arrhythmias and no high grade AV block. Echo October 2018 with normal LV function, mild AI, mild MR. Hospitalized in April 2019 with anemia (Hgb 6.6) felt to be due to GI bleeding. Aricept dose lowered due to bradycardia. He fell in July 2020 and was placed in *** from the ED due to unsafe living conditions. He is wearing a 30 day event monitor. ***  He is here today for follow up. The patient denies any chest pain, dyspnea, palpitations, lower extremity edema, orthopnea, PND, dizziness, near syncope or syncope.      Primary Care Physician: Biagio Borg, MD  Past Medical History:  Diagnosis Date  . Cancer ([place])    lung and prostate cancer per wife  . 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 CKD (chronic kidney disease), stage III ([place])   . Coronary artery disease    a. DES to LAD and Cx 02/2012.  [name] Dementia ([place])   . Dysuria 10/10/2010  . Emphysema 04/01/2012   By CT chest, august 2013  . ERECTILE DYSFUNCTION 04/05/2007  . GERD (gastroesophageal reflux disease)   . HYPERLIPIDEMIA 07/30/2008  . HYPERSOMNIA 10/02/2007  . Impaired glucose tolerance 02/09/2011  . INSOMNIA-SLEEP DISORDER-UNSPEC 10/22/2009  . LOW BACK PAIN 04/05/2007  . Mild carotid artery disease ([place])    a. 1-39% bilaterally by duplex 11/2016.  . OSTEOARTHRITIS 04/05/2007  . PEPTIC ULCER DISEASE 04/05/2007  . PERIPHERAL EDEMA 10/22/2009  . PROSTATE CANCER, HX OF 04/05/2007  .  RESTLESS LEG SYNDROME 05/15/2007  . RHINITIS, ALLERGIC NOS 05/15/2007  . Sinus bradycardia 10/24/2010   a. prior h/o HR 40s, clonidine stopped 05/2017 due to this.  [name] Unspecified visual loss 07/15/2008  . UTI 10/24/2010    Past Surgical History:  Procedure Laterality Date  . CARDIAC CATHETERIZATION    . COLONOSCOPY N/A 11/28/2017   Procedure: COLONOSCOPY;  Surgeon: Yetta Flock, MD;  Location: [facility];  Service: Gastroenterology;  Laterality: N/A;  . ESOPHAGOGASTRODUODENOSCOPY N/A 11/28/2017   Procedure: ESOPHAGOGASTRODUODENOSCOPY (EGD);  Surgeon: Yetta Flock, MD;  Location: [facility];  Service: Gastroenterology;  Laterality: N/A;  . EYE SURGERY     right  . Inguinal herniorrhapy left  2002   x 2  . Left hip replacement    . LYMPH NODE DISSECTION Right 03/22/2015   Procedure: LYMPH NODE DISSECTION;  Surgeon: Melrose Nakayama, MD;  Location: [facility];  Service: Thoracic;  Laterality: Right;  . PERCUTANEOUS CORONARY STENT INTERVENTION (PCI-S) N/A 03/08/2012   Procedure: PERCUTANEOUS CORONARY STENT INTERVENTION (PCI-S);  Surgeon: Burnell Blanks, MD;  Location: [facility] CATH LAB;  Service: Cardiovascular;  Laterality: N/A;  . PROSTATECTOMY  2002   radical  . Right hip replacement  09/22/09  . ROTATOR CUFF REPAIR     left  . s/p ventral surgery  2009  . SEGMENTECOMY Right 03/22/2015   Procedure: RIGHT LOWER LOBE SUPERIOR SEGMENTECTOMY;  Surgeon: Melrose Nakayama, MD;  Location: [facility]  OR;  Service: Thoracic;  Laterality: Right;  [name] VIDEO ASSISTED THORACOSCOPY Right 03/22/2015   Procedure: RIGHT VIDEO ASSISTED THORACOSCOPY;  Surgeon: Melrose Nakayama, MD;  Location: [facility];  Service: Thoracic;  Laterality: Right;    Current Outpatient Medications  Medication Sig Dispense Refill  . abiraterone acetate (ZYTIGA) 500 MG tablet Take 1,000 mg by mouth daily. Take on an empty stomach 1 hour before or 2 hours after a meal.    . amLODipine (NORVASC) 10 MG tablet TAKE 1 TABLET  BY MOUTH EVERY DAY 90 tablet 0  . amLODipine (NORVASC) 5 MG tablet Take 1 tablet (5 mg total) by mouth daily. 90 tablet 3  . Ascorbic Acid (VITAMIN C) 500 MG CAPS Take 500 mg by mouth daily.     [name] aspirin 81 MG chewable tablet Chew 1 tablet (81 mg total) by mouth daily. Start from 4/22 30 tablet 0  . azelastine (OPTIVAR) 0.05 % ophthalmic solution Place 1 drop into both eyes 2 (two) times daily. 6 mL 12  . citalopram (CELEXA) 20 MG tablet Take 1 tablet (20 mg total) by mouth daily. 90 tablet 1  . clopidogrel (PLAVIX) 75 MG tablet TAKE 1 TABLET BY MOUTH EVERY DAY 90 tablet 0  . donepezil (ARICEPT) 5 MG tablet TAKE 1 TABLET BY MOUTH EVERYDAY AT BEDTIME 90 tablet 0  . ferrous sulfate 325 (65 FE) MG tablet Take 325 mg by mouth daily.   1  . gabapentin (NEURONTIN) 300 MG capsule TAKE 1 CAPSULE BY MOUTH TWICE A DAY 180 capsule 0  . hydrALAZINE (APRESOLINE) 25 MG tablet TAKE 1 TABLET (25 MG TOTAL) BY MOUTH EVERY 8 (EIGHT) HOURS. 90 tablet 0  . isosorbide mononitrate (IMDUR) 30 MG 24 hr tablet Take 1 tablet (30 mg total) by mouth daily. 90 tablet 1  . lovastatin (MEVACOR) 40 MG tablet Take 1 tablet (40 mg total) by mouth at bedtime. -- Office visit needed for further refills 90 tablet 0  . Magnesium 500 MG TABS Take 500 mg by mouth daily.     . meclizine (ANTIVERT) 25 MG tablet Take 0.5-1 tablets (12.5-25 mg total) by mouth as needed for dizziness. TAKE 1/2 - 1 TABLET BY MOUTH THREE TIMES A DAY AS NEEDED FOR DIZINESS (Patient taking differently: Take 12.5-25 mg by mouth 3 (three) times daily as needed for dizziness. ) 30 tablet 2  . memantine (NAMENDA TITRATION PAK) tablet pack 5 mg/day for =1 week; 5 mg twice daily for =1 week; 15 mg/day given in 5 mg and 10 mg separated doses for =1 week; then 10 mg twice daily 49 tablet 12  . nitroGLYCERIN (NITROSTAT) 0.4 MG SL tablet Place 1 tablet (0.4 mg total) under the tongue every 5 (five) minutes as needed for chest pain. 25 tablet 3  . pantoprazole (PROTONIX) 40  MG tablet TAKE 1 TABLET BY MOUTH EVERY DAY (Patient taking differently: Take 40 mg by mouth daily. ) 90 tablet 1  . pramipexole (MIRAPEX) 0.5 MG tablet Take 1 tablet (0.5 mg total) by mouth 3 (three) times daily. 270 tablet 3  . predniSONE (DELTASONE) 5 MG tablet Take 5 mg daily with breakfast by mouth.    . thiamine (VITAMIN B-1) 50 MG tablet Take 1 tablet (50 mg total) by mouth daily. (Patient taking differently: Take 100 mg by mouth daily. ) 30 tablet 0   No current facility-administered medications for this visit.     Allergies  Allergen Reactions  . Amitriptyline Hcl Other (See Comments)  REACTION: confusion  . Diphenhydramine Hcl Other (See Comments)    Keeps him awake  . Simvastatin Other (See Comments)    REACTION: leg pain  . Clonidine Derivatives     Dizziness, falls, bradycardia  . Tylenol [Acetaminophen] Other (See Comments)    Dr advised pt not to take due to finding a spot on pt's kidney    Social History   Socioeconomic History  . Marital status: Married    Spouse name: Not on file  . Number of children: 2  . Years of education: Not on file  . Highest education level: Not on file  Occupational History  . Occupation: truck driver-retired  Social Needs  . Financial resource strain: Somewhat hard  . Food insecurity    Worry: Sometimes true    Inability: Sometimes true  . Transportation needs    Medical: No    Non-medical: No  Tobacco Use  . Smoking status: Former Smoker    Packs/day: 1.00    Years: 15.00    Pack years: 15.00    Types: Cigarettes    Quit date: 02/05/1977    Years since quitting: 42.1  . Smokeless tobacco: Never Used  Substance and Sexual Activity  . Alcohol use: No    Alcohol/week: 1.0 standard drinks    Types: 1 Standard drinks or equivalent per week    Comment: RARE  . Drug use: No  . Sexual activity: Not Currently  Lifestyle  . Physical activity    Days per week: 0 days    Minutes per session: 0 min  . Stress: Not at all   Relationships  . Social connections    Talks on phone: More than three times a week    Gets together: More than three times a week    Attends religious service: Never    Active member of club or organization: No    Attends meetings of clubs or organizations: Never    Relationship status: Married  . Intimate partner violence    Fear of current or ex partner: No    Emotionally abused: No    Physically abused: No    Forced sexual activity: No  Other Topics Concern  . Not on file  Social History Narrative  . Not on file    Family History  Problem Relation Age of Onset  . Heart attack Father 62       Smoker  . Heart attack Brother 33  . Lung cancer Sister   . Colon cancer Neg Hx     Review of Systems:  As stated in the HPI and otherwise negative.   There were no vitals taken for this visit.  Physical Exam:   General: Well developed, well nourished, NAD  HEENT: OP clear, mucus membranes moist  SKIN: warm, dry. No rashes. Neuro: No focal deficits  Musculoskeletal: Muscle strength 5/5 all ext  Psychiatric: Mood and affect normal  Neck: No JVD, no carotid bruits, no thyromegaly, no lymphadenopathy.  Lungs:Clear bilaterally, no wheezes, rhonci, crackles Cardiovascular: Regular rate and rhythm. No murmurs, gallops or rubs. Abdomen:Soft. Bowel sounds present. Non-tender.  Extremities: No lower extremity edema. Pulses are 2 + in the bilateral DP/PT.  Echo October 2018: - Left ventricle: The cavity size was normal. Wall thickness was   increased in a pattern of mild LVH. Systolic function was normal.   The estimated ejection fraction was in the range of 60% to 65%.   Wall motion was normal; there were no regional wall motion  abnormalities. - Aortic valve: There was mild regurgitation. - Mitral valve: Calcified annulus. There was mild regurgitation.  EKG:  EKG is not ordered today. The ekg ordered today demonstrates   Recent Labs: 02/24/2019: ALT 6; TSH 2.840 02/25/2019:  BUN 43; Creatinine, Ser 2.70; Hemoglobin 9.2; Platelets 118; Potassium 3.5; Sodium 143   Lipid Panel    Component Value Date/Time   CHOL 78 12/24/2018 1139   TRIG 99.0 12/24/2018 1139   HDL 29.90 (L) 12/24/2018 1139   CHOLHDL 3 12/24/2018 1139   VLDL 19.8 12/24/2018 1139   LDLCALC 28 12/24/2018 1139   LDLDIRECT 104.9 02/10/2011 1224     Wt Readings from Last 3 Encounters:  02/25/19 127 lb (57.6 kg)  02/24/19 127 lb (57.6 kg)  02/24/19 123 lb (55.8 kg)     Other studies Reviewed: Additional studies/ records that were reviewed today include: . Review of the above records demonstrates:   Assessment and Plan:   1. CAD without angina: No chest pain. Will continue ASA, Plavix, statin and Imdur. He is not on a beta blocker due to bradycardia.    2. HTN: BP is controlled at home. No changes  3. HLD: LDL at goal. Continue statin.   4. Carotid artery disease: Stable bilateral mild to moderate carotid disease by dopplers June 2019.     Current medicines are reviewed at length with the patient today.  The patient does not have concerns regarding medicines.  The following changes have been made:  no change  Labs/ tests ordered today include:  No orders of the defined types were placed in this encounter.  Disposition:   FU with me in 12 months   Signed, Lauree Chandler, MD 04/07/2019 6:37 AM    [facility] [address], [place], [place]  [address] Phone: [phone]; Fax: [phone]

## 2019-04-10 DIAGNOSIS — M8580 Other specified disorders of bone density and structure, unspecified site: Secondary | ICD-10-CM | POA: Diagnosis not present

## 2019-04-10 DIAGNOSIS — Z5111 Encounter for antineoplastic chemotherapy: Secondary | ICD-10-CM | POA: Diagnosis not present

## 2019-04-10 DIAGNOSIS — N183 Chronic kidney disease, stage 3 (moderate): Secondary | ICD-10-CM | POA: Diagnosis not present

## 2019-04-17 ENCOUNTER — Other Ambulatory Visit: Payer: Self-pay | Admitting: Internal Medicine

## 2019-05-05 ENCOUNTER — Encounter: Payer: Self-pay | Admitting: Neurology

## 2019-05-05 ENCOUNTER — Telehealth: Payer: Self-pay

## 2019-05-05 ENCOUNTER — Ambulatory Visit: Payer: Medicare Other | Admitting: Neurology

## 2019-05-05 NOTE — Telephone Encounter (Signed)
PT no show for appt.

## 2019-05-06 ENCOUNTER — Encounter: Payer: Self-pay | Admitting: Physician Assistant

## 2019-05-06 NOTE — Progress Notes (Addendum)
Cardiology Office Note    Date:  05/08/2019   ID:  ZACHERY NISWANDER, DOB 05-Sep-1940, MRN [id number]  PCP:  Biagio Borg, MD  Cardiologist:  Lauree Chandler, MD  Electrophysiologist:  None   Chief Complaint: f/u bradycardia, CAD  History of Present Illness:   Victor Davis is a 78 y.o. male with history of CAD (DES to LAD and Cx 02/2012), mild carotid artery disease (40-59% 02/2019), anemia, CKD IV, CVA, hypertension, hyperlipidemia, lung cancer, prostate cancer, dementia, known bradycardia, active bed bugs and poor living conditions who presents for ? routine follow-up. Last OV was in 07/2018 with recommendation to follow up in 1 year.  To recap history aside from above, in 2016, he was evaluated for possible syncopal event. Cardiac event monitor showed rare PVCs but no other arrhythmias and no high-grade AV block. In 05/2017 he was admitted after sustaining a mechanical fall at home with subsequent laceration to his forehead. He had noted prior HR in the 40s. During admission he was noted to have anemia down to 8.3/thrombocytopenia, AKI on suspected CKD (1.8, baseline 1.5-1.6). Cardiology was asked to eval due to HR in the 40s-60s. He was on clonidine and Aricept at the time but no other AVN blocking agents. 2D echo 05/2017 showed mild LVH, EF 60-65%. no RWMA, mild AI/MR. Clonidine was stopped, but Dr. Sallyanne Kuster did not feel his falls were related to bradycardia. Further management of his anemia was recommended. He was rehospitalized in 11/2017 with anemia (Hgb 6.6) felt to be due to GI bleeding, felt to be chronic GI blood loss. Diagnostic EGD/colonoscopy was reported without acute bleeding but he did have polyps so future study was recommended. Aricept dose lowered due to bradycardia. In July 2020 he was evaluated in the ER for a fall and found to have unsafe living conditions. Per triage note, "Pt's wife called EMS for lifting assistance, when they arrived, they had a cramped space to walk  through about 8 inches, roaches, cats, space heaters throughout the house and a hospital bed with no linen. EMS assisted his up and he was very unstable so they transported the patient. The patient doesn't remember falling or calling EMS." He was boarded in the ER and then discharged to a living facility. During that month he was also seen by neurology for cognitive impairment felt to represent dementia. Dr. Leonie Man had recommended a 30-day monitor to assess for PAF given his history of stroke. It looks like our office mailed this but had not yet been completed because the wife did not know how to put it on. Last labs 03/2019 showed Cr 2.2, 02/2019 Hgb 8.8, plt 130, 02/2019 LFTs ok.   He is seen today with his wife. Apparently he refused to stay at the SNF so wife brought him home. She is really worried about him. He spends most of his time falling over asleep. He is losing weight. Both she and he have bed bugs crawling on them. A cockroach as also identified in the exam room as well. Wife states she knew she had them but they have been unable to afford fumigation. She plans to call a senior company that she knows of that can assist but just hasn't done so yet. There is one couch at home that she plans to dispose of because it is covered in them. They cannot afford a car and have no extra money or family. The patient falls asleep frequently during the examination and denies any complaints today. The  only time he stayed awake and interacted vigorously was when we were discussing sending him to the ER, which unfortunately he adamantly refused.     Past Medical History:  Diagnosis Date   Anemia, unspecified    Cancer ([place])    lung and prostate cancer per wife   Carotid artery disease ([place])    CKD (chronic kidney disease), stage IV ([place])    Coronary artery disease    a. DES to LAD and Cx 02/2012.   Dementia ([place])    Dysuria 10/10/2010   Emphysema 04/01/2012   By CT chest, august 2013   ERECTILE  DYSFUNCTION 04/05/2007   GERD (gastroesophageal reflux disease)    GI bleeding    HYPERLIPIDEMIA 07/30/2008   HYPERSOMNIA 10/02/2007   Impaired glucose tolerance 02/09/2011   INSOMNIA-SLEEP DISORDER-UNSPEC 10/22/2009   LOW BACK PAIN 04/05/2007   OSTEOARTHRITIS 04/05/2007   PEPTIC ULCER DISEASE 04/05/2007   PERIPHERAL EDEMA 10/22/2009   PROSTATE CANCER, HX OF 04/05/2007   RESTLESS LEG SYNDROME 05/15/2007   RHINITIS, ALLERGIC NOS 05/15/2007   Sinus bradycardia 10/24/2010   a. prior h/o HR 40s, clonidine stopped 05/2017 due to this.   Stroke (cerebrum) ([place])    Unspecified visual loss 07/15/2008   UTI 10/24/2010    Past Surgical History:  Procedure Laterality Date   CARDIAC CATHETERIZATION     COLONOSCOPY N/A 11/28/2017   Procedure: COLONOSCOPY;  Surgeon: Yetta Flock, MD;  Location: [facility];  Service: Gastroenterology;  Laterality: N/A;   ESOPHAGOGASTRODUODENOSCOPY N/A 11/28/2017   Procedure: ESOPHAGOGASTRODUODENOSCOPY (EGD);  Surgeon: Yetta Flock, MD;  Location: [facility];  Service: Gastroenterology;  Laterality: N/A;   EYE SURGERY     right   Inguinal herniorrhapy left  2002   x 2   Left hip replacement     LYMPH NODE DISSECTION Right 03/22/2015   Procedure: LYMPH NODE DISSECTION;  Surgeon: Melrose Nakayama, MD;  Location: [facility];  Service: Thoracic;  Laterality: Right;   PERCUTANEOUS CORONARY STENT INTERVENTION (PCI-S) N/A 03/08/2012   Procedure: PERCUTANEOUS CORONARY STENT INTERVENTION (PCI-S);  Surgeon: Burnell Blanks, MD;  Location: [facility] CATH LAB;  Service: Cardiovascular;  Laterality: N/A;   PROSTATECTOMY  2002   radical   Right hip replacement  09/22/09   ROTATOR CUFF REPAIR     left   s/p ventral surgery  2009   SEGMENTECOMY Right 03/22/2015   Procedure: RIGHT LOWER LOBE SUPERIOR SEGMENTECTOMY;  Surgeon: Melrose Nakayama, MD;  Location: [facility];  Service: Thoracic;  Laterality: Right;   VIDEO ASSISTED THORACOSCOPY Right  03/22/2015   Procedure: RIGHT VIDEO ASSISTED THORACOSCOPY;  Surgeon: Melrose Nakayama, MD;  Location: [facility];  Service: Thoracic;  Laterality: Right;    Current Medications: Current Meds  Medication Sig   abiraterone acetate (ZYTIGA) 500 MG tablet Take 1,000 mg by mouth daily. Take on an empty stomach 1 hour before or 2 hours after a meal.   amLODipine (NORVASC) 5 MG tablet Take 1 tablet (5 mg total) by mouth daily.   Ascorbic Acid (VITAMIN C) 500 MG CAPS Take 500 mg by mouth daily.    aspirin 81 MG chewable tablet Chew 1 tablet (81 mg total) by mouth daily. Start from 4/22   azelastine (OPTIVAR) 0.05 % ophthalmic solution Place 1 drop into both eyes 2 (two) times daily.   citalopram (CELEXA) 20 MG tablet Take 1 tablet (20 mg total) by mouth daily.   clopidogrel (PLAVIX) 75 MG tablet TAKE 1 TABLET BY MOUTH EVERY DAY  donepezil (ARICEPT) 5 MG tablet TAKE 1 TABLET BY MOUTH EVERYDAY AT BEDTIME   ferrous sulfate 325 (65 FE) MG tablet Take 325 mg by mouth daily.    gabapentin (NEURONTIN) 300 MG capsule TAKE 1 CAPSULE BY MOUTH TWICE A DAY   hydrALAZINE (APRESOLINE) 25 MG tablet TAKE 1 TABLET (25 MG TOTAL) BY MOUTH EVERY 8 (EIGHT) HOURS.   isosorbide mononitrate (IMDUR) 30 MG 24 hr tablet Take 1 tablet (30 mg total) by mouth daily.   lovastatin (MEVACOR) 40 MG tablet Take 1 tablet (40 mg total) by mouth at bedtime. -- Office visit needed for further refills   Magnesium 500 MG TABS Take 500 mg by mouth daily.    meclizine (ANTIVERT) 25 MG tablet Take 0.5-1 tablets (12.5-25 mg total) by mouth as needed for dizziness. TAKE 1/2 - 1 TABLET BY MOUTH THREE TIMES A DAY AS NEEDED FOR DIZINESS (Patient taking differently: Take 12.5-25 mg by mouth 3 (three) times daily as needed for dizziness. )   nitroGLYCERIN (NITROSTAT) 0.4 MG SL tablet Place 1 tablet (0.4 mg total) under the tongue every 5 (five) minutes as needed for chest pain.   pantoprazole (PROTONIX) 40 MG tablet TAKE 1 TABLET BY  MOUTH EVERY DAY (Patient taking differently: Take 40 mg by mouth daily. )   pramipexole (MIRAPEX) 0.5 MG tablet Take 1 tablet (0.5 mg total) by mouth 3 (three) times daily.   predniSONE (DELTASONE) 5 MG tablet Take 5 mg daily with breakfast by mouth.   thiamine (VITAMIN B-1) 50 MG tablet Take 1 tablet (50 mg total) by mouth daily. (Patient taking differently: Take 100 mg by mouth daily. )      Allergies:   Amitriptyline hcl, Diphenhydramine hcl, Simvastatin, Clonidine derivatives, and Tylenol [acetaminophen]   Social History   Socioeconomic History   Marital status: Married    Spouse name: Not on file   Number of children: 2   Years of education: Not on file   Highest education level: Not on file  Occupational History   Occupation: truck driver-retired  Scientist, product/process development strain: Somewhat hard   Food insecurity    Worry: Sometimes true    Inability: Sometimes true   Transportation needs    Medical: No    Non-medical: No  Tobacco Use   Smoking status: Former Smoker    Packs/day: 1.00    Years: 15.00    Pack years: 15.00    Types: Cigarettes    Quit date: 02/05/1977    Years since quitting: 42.2   Smokeless tobacco: Never Used  Substance and Sexual Activity   Alcohol use: No    Alcohol/week: 1.0 standard drinks    Types: 1 Standard drinks or equivalent per week    Comment: RARE   Drug use: No   Sexual activity: Not Currently  Lifestyle   Physical activity    Days per week: 0 days    Minutes per session: 0 min   Stress: Not at all  Relationships   Social connections    Talks on phone: More than three times a week    Gets together: More than three times a week    Attends religious service: Never    Active member of club or organization: No    Attends meetings of clubs or organizations: Never    Relationship status: Married  Other Topics Concern   Not on file  Social History Narrative   Not on file     Family History:  The  patient's family history includes Heart attack (age of onset: 16) in his father; Heart attack (age of onset: 30) in his brother; Lung cancer in his sister. There is no history of Colon cancer.  ROS:   Please see the history of present illness.  All other systems are reviewed and otherwise negative.    EKGs/Labs/Other Studies Reviewed:    Studies reviewed were summarized above.   EKG:  EKG is ordered today, personally reviewed, demonstrating sinus bradycardia 50bpm, no acute STT changes  Recent Labs: 02/24/2019: ALT 6; TSH 2.840 02/25/2019: BUN 43; Creatinine, Ser 2.70; Hemoglobin 9.2; Platelets 118; Potassium 3.5; Sodium 143  Recent Lipid Panel    Component Value Date/Time   CHOL 78 12/24/2018 1139   TRIG 99.0 12/24/2018 1139   HDL 29.90 (L) 12/24/2018 1139   CHOLHDL 3 12/24/2018 1139   VLDL 19.8 12/24/2018 1139   LDLCALC 28 12/24/2018 1139   LDLDIRECT 104.9 02/10/2011 1224    PHYSICAL EXAM:    VS:  BP (!) 148/60    Pulse (!) 50    Ht 5\' 11"  (1.803 m)    Wt 126 lb 12.8 oz (57.5 kg)    SpO2 97%    BMI 17.69 kg/m   BMI: Body mass index is 17.69 kg/m.  GEN: Chronically ill, frail, underweight WM, lethargic, arouses to loud voice HEENT: normocephalic, atraumatic Neck: no JVD, carotid bruits, or masses Cardiac: Reg rhythm, bradycardic rate; no murmurs, rubs, or gallops, no edema  Respiratory:  clear to auscultation bilaterally, normal work of breathing GI: soft, nontender, nondistended, + BS MS: no deformity or atrophy Skin: warm and dry, no rash Neuro:  Oriented to self, doctor's office, not year, Strength and sensation are intact, follows commands Psych: euthymic mood when awake  Wt Readings from Last 3 Encounters:  05/08/19 126 lb 12.8 oz (57.5 kg)  02/25/19 127 lb (57.6 kg)  02/24/19 127 lb (57.6 kg)     ASSESSMENT & PLAN:   1. Failure to thrive - patient is showing signs of adult neglect and failure to thrive. I do not believe on first meeting this is intentional on  the wife's part because they are also struggling financially. He was brought into the ER in 02/2019 because his home was uninhabitable with roaches, cats, bed bugs and waste. I do not think it is safe for them to return home. His lethargy also concerns me - wife says he's been this way for several months. Unfortunately he has not seen primary care or anyone else for his general medical decline. We have recommended EMS transport to the ER for further management and to reconsider placement and offer social work resources to him and his wife. Addendum: At first he was agreeable but then he changed his mind and both he and his wife adamantly refused and requested to go back home without further intervention. We called [facility] for acute intervention and they advised that there is nothing they can currently provide acutely and that we have to file a formal report. When I called back to make the formal report, I got their voicemail so I have left a message for them to call me back. I will also CC to Dr. Jenny Reichmann to make him aware. Patient and wife requested to leave to return to home. 2. CAD - he has been continued on ASA/Plavix from stroke perspective. I will reach out to Dr. Leonie Man and Dr. Angelena Form to discuss whether one or the other can be discontinued given his fall risk, chronic  anemia felt due to chronic GI blood loss. He will continue current regimen otherwise. His BP is running slightly elevated but given historian weakness/fatigue would not pursue stricter control at this time. He denies any acute complaints but is not a great historian. 3. Acute kidney injury superimposed on CKD (now likely stage IV) - repeat labs were stable by urology per [facility] in 03/2019. 4. Chronic bradycardia - appears stable, no change. 5. History of stroke - neuro had recommended 30 day monitor. I'm not sure if this is still even appropriate given his declined mental status, failure to thrive, chronic anemia and inability to ensure safety at home.  (If an arrhythmia were identified he does not seem that he'd be a great candidate for anticoagulation.) I will route a message to Dr. Leonie Man for review. 6. Carotid artery disease - stable by duplex 02/2019. Will defer timing of f/u to Dr. Angelena Form in follow-up.  Disposition: F/u with Dr. Angelena Form in 6 months virtually. I have asked MA to assist with putting in an alert on his chart so that when he requires in-office appointment in the future, he will be screened to discuss bed bug status.  Medication Adjustments/Labs and Tests Ordered: Current medicines are reviewed at length with the patient today.  Concerns regarding medicines are outlined above. Medication changes, Labs and Tests ordered today are summarized above and listed in the Patient Instructions accessible in Encounters.   Signed, Charlie Pitter, PA-C  05/08/2019 3:54 PM    [facility] [address], [place], [place]  [address] Phone: [phone]; Fax: [phone]

## 2019-05-08 ENCOUNTER — Other Ambulatory Visit: Payer: Self-pay

## 2019-05-08 ENCOUNTER — Ambulatory Visit (INDEPENDENT_AMBULATORY_CARE_PROVIDER_SITE_OTHER): Payer: Medicare Other | Admitting: Physician Assistant

## 2019-05-08 ENCOUNTER — Encounter: Payer: Self-pay | Admitting: Physician Assistant

## 2019-05-08 VITALS — BP 148/60 | HR 50 | Ht 71.0 in | Wt 126.8 lb

## 2019-05-08 DIAGNOSIS — Z8673 Personal history of transient ischemic attack (TIA), and cerebral infarction without residual deficits: Secondary | ICD-10-CM | POA: Diagnosis not present

## 2019-05-08 DIAGNOSIS — I6523 Occlusion and stenosis of bilateral carotid arteries: Secondary | ICD-10-CM

## 2019-05-08 DIAGNOSIS — R001 Bradycardia, unspecified: Secondary | ICD-10-CM

## 2019-05-08 DIAGNOSIS — N179 Acute kidney failure, unspecified: Secondary | ICD-10-CM

## 2019-05-08 DIAGNOSIS — I2510 Atherosclerotic heart disease of native coronary artery without angina pectoris: Secondary | ICD-10-CM | POA: Diagnosis not present

## 2019-05-08 DIAGNOSIS — R627 Adult failure to thrive: Secondary | ICD-10-CM | POA: Diagnosis not present

## 2019-05-08 DIAGNOSIS — N189 Chronic kidney disease, unspecified: Secondary | ICD-10-CM

## 2019-05-08 NOTE — Patient Instructions (Signed)
You are being sent to the emergency room.  We hope you feel better soon!

## 2019-05-09 ENCOUNTER — Telehealth: Payer: Self-pay | Admitting: Licensed Clinical Social Worker

## 2019-05-09 ENCOUNTER — Telehealth: Payer: Self-pay | Admitting: Physician Assistant

## 2019-05-09 NOTE — Telephone Encounter (Signed)
    See my note from yesterday.  In correspondence with Dr. Leonie Man and Dr. Angelena Form, they felt it was appropriate for the patient to stop Plavix given his progressive decline and chronic anemia. We will continue aspirin as tolerated. There is also agreement that given he would be a poor candidate for anticoagulation, a 30 day monitor is no longer necessary.   I called Mr. Davidow' wife and spoke with her about this. She acknowledged stopping the Plavix and I removed it from his med list. She still has the cardiac monitor at home (was ordered in July but she never knew how to put it on). I will route to monitor assistant Markus Daft to find out who this should be sent to for review - given their terrible financial situation, is there a way for her to ship it back without being charged?  I also reached out to [employer] yesterday about concern that Mr. And Mrs. Sumlin would greatly benefit from social work involvement and potential patient assistance given their financial difficulties. Nevin Bloodgood said she would talk to Raquel Sarna which I greatly appreciate. I will route this message their way. Mrs. Croy acknowledges this would be helpful. I also relayed that their 6 month f/u would be a virtual visit.  Lastly, Adult Protective Services called me back today (mandatory reporter) to finalize his report and I relayed our concerns both observed in yesterday's OV and the ER notes from 02/2019.   Melina Copa

## 2019-05-09 NOTE — Telephone Encounter (Signed)
CSW received referral from Melina Copa, [place] to assist patient with support and community resources. CSW contacted patient's home and left message for return call. CSW will attempt to contact again on Monday if no return call today. Raquel Sarna, [place], [place]

## 2019-05-10 ENCOUNTER — Emergency Department (HOSPITAL_COMMUNITY): Payer: Medicare Other

## 2019-05-10 ENCOUNTER — Other Ambulatory Visit: Payer: Self-pay

## 2019-05-10 ENCOUNTER — Ambulatory Visit (INDEPENDENT_AMBULATORY_CARE_PROVIDER_SITE_OTHER): Payer: Medicare Other | Admitting: Family Medicine

## 2019-05-10 ENCOUNTER — Emergency Department (HOSPITAL_COMMUNITY)
Admission: EM | Admit: 2019-05-10 | Discharge: 2019-05-10 | Disposition: A | Discharge status: Home / Self Care | Payer: Medicare Other | Attending: Emergency Medicine | Admitting: Emergency Medicine

## 2019-05-10 ENCOUNTER — Encounter: Payer: Self-pay | Admitting: Family Medicine

## 2019-05-10 ENCOUNTER — Encounter (HOSPITAL_COMMUNITY): Payer: Self-pay | Admitting: Emergency Medicine

## 2019-05-10 DIAGNOSIS — I129 Hypertensive chronic kidney disease with stage 1 through stage 4 chronic kidney disease, or unspecified chronic kidney disease: Secondary | ICD-10-CM | POA: Insufficient documentation

## 2019-05-10 DIAGNOSIS — Z96643 Presence of artificial hip joint, bilateral: Secondary | ICD-10-CM | POA: Insufficient documentation

## 2019-05-10 DIAGNOSIS — Z85118 Personal history of other malignant neoplasm of bronchus and lung: Secondary | ICD-10-CM | POA: Diagnosis not present

## 2019-05-10 DIAGNOSIS — R531 Weakness: Secondary | ICD-10-CM

## 2019-05-10 DIAGNOSIS — R319 Hematuria, unspecified: Secondary | ICD-10-CM | POA: Diagnosis not present

## 2019-05-10 DIAGNOSIS — N390 Urinary tract infection, site not specified: Secondary | ICD-10-CM | POA: Diagnosis not present

## 2019-05-10 DIAGNOSIS — Z7982 Long term (current) use of aspirin: Secondary | ICD-10-CM | POA: Diagnosis not present

## 2019-05-10 DIAGNOSIS — I2510 Atherosclerotic heart disease of native coronary artery without angina pectoris: Secondary | ICD-10-CM | POA: Diagnosis not present

## 2019-05-10 DIAGNOSIS — S0990XA Unspecified injury of head, initial encounter: Secondary | ICD-10-CM | POA: Diagnosis not present

## 2019-05-10 DIAGNOSIS — S3991XA Unspecified injury of abdomen, initial encounter: Secondary | ICD-10-CM | POA: Diagnosis not present

## 2019-05-10 DIAGNOSIS — F0390 Unspecified dementia without behavioral disturbance: Secondary | ICD-10-CM | POA: Insufficient documentation

## 2019-05-10 DIAGNOSIS — N184 Chronic kidney disease, stage 4 (severe): Secondary | ICD-10-CM | POA: Insufficient documentation

## 2019-05-10 DIAGNOSIS — Z79899 Other long term (current) drug therapy: Secondary | ICD-10-CM | POA: Diagnosis not present

## 2019-05-10 DIAGNOSIS — N200 Calculus of kidney: Secondary | ICD-10-CM | POA: Insufficient documentation

## 2019-05-10 DIAGNOSIS — R627 Adult failure to thrive: Secondary | ICD-10-CM | POA: Insufficient documentation

## 2019-05-10 DIAGNOSIS — W19XXXA Unspecified fall, initial encounter: Secondary | ICD-10-CM | POA: Diagnosis not present

## 2019-05-10 DIAGNOSIS — Z8546 Personal history of malignant neoplasm of prostate: Secondary | ICD-10-CM | POA: Diagnosis not present

## 2019-05-10 DIAGNOSIS — Z87891 Personal history of nicotine dependence: Secondary | ICD-10-CM | POA: Diagnosis not present

## 2019-05-10 DIAGNOSIS — R52 Pain, unspecified: Secondary | ICD-10-CM | POA: Diagnosis not present

## 2019-05-10 LAB — COMPREHENSIVE METABOLIC PANEL
ALT: 6 U/L (ref 0–44)
AST: 14 U/L — ABNORMAL LOW (ref 15–41)
Albumin: 3.8 g/dL (ref 3.5–5.0)
Alkaline Phosphatase: 81 U/L (ref 38–126)
Anion gap: 8 (ref 5–15)
BUN: 41 mg/dL — ABNORMAL HIGH (ref 8–23)
CO2: 23 mmol/L (ref 22–32)
Calcium: 9.3 mg/dL (ref 8.9–10.3)
Chloride: 109 mmol/L (ref 98–111)
Creatinine, Ser: 2.27 mg/dL — ABNORMAL HIGH (ref 0.61–1.24)
GFR calc Af Amer: 31 mL/min — ABNORMAL LOW (ref >60)
GFR calc non Af Amer: 26 mL/min — ABNORMAL LOW (ref >60)
Glucose, Bld: 106 mg/dL — ABNORMAL HIGH (ref 70–99)
Potassium: 3.7 mmol/L (ref 3.5–5.1)
Sodium: 140 mmol/L (ref 135–145)
Total Bilirubin: 1.2 mg/dL (ref 0.3–1.2)
Total Protein: 6.8 g/dL (ref 6.5–8.1)

## 2019-05-10 LAB — CBC WITH DIFFERENTIAL/PLATELET
Abs Immature Granulocytes: 0.04 10*3/uL (ref 0.00–0.07)
Basophils Absolute: 0 10*3/uL (ref 0.0–0.1)
Basophils Relative: 0 %
Eosinophils Absolute: 0 10*3/uL (ref 0.0–0.5)
Eosinophils Relative: 1 %
HCT: 27.3 % — ABNORMAL LOW (ref 39.0–52.0)
Hemoglobin: 8.2 g/dL — ABNORMAL LOW (ref 13.0–17.0)
Immature Granulocytes: 1 %
Lymphocytes Relative: 9 %
Lymphs Abs: 0.5 10*3/uL — ABNORMAL LOW (ref 0.7–4.0)
MCH: 29.9 pg (ref 26.0–34.0)
MCHC: 30 g/dL (ref 30.0–36.0)
MCV: 99.6 fL (ref 80.0–100.0)
Monocytes Absolute: 0.3 10*3/uL (ref 0.1–1.0)
Monocytes Relative: 5 %
Neutro Abs: 4.5 10*3/uL (ref 1.7–7.7)
Neutrophils Relative %: 84 %
Platelets: 139 10*3/uL — ABNORMAL LOW (ref 150–400)
RBC: 2.74 MIL/uL — ABNORMAL LOW (ref 4.22–5.81)
RDW: 13.8 % (ref 11.5–15.5)
WBC: 5.4 10*3/uL (ref 4.0–10.5)
nRBC: 0 % (ref 0.0–0.2)

## 2019-05-10 LAB — URINALYSIS, COMPLETE (UACMP) WITH MICROSCOPIC
Bilirubin Urine: NEGATIVE
Glucose, UA: NEGATIVE mg/dL
Ketones, ur: NEGATIVE mg/dL
Nitrite: NEGATIVE
Protein, ur: 100 mg/dL — AB
Specific Gravity, Urine: 1.009 (ref 1.005–1.030)
pH: 9 — ABNORMAL HIGH (ref 5.0–8.0)

## 2019-05-10 LAB — PHOSPHORUS: Phosphorus: 3.4 mg/dL (ref 2.5–4.6)

## 2019-05-10 LAB — MAGNESIUM: Magnesium: 2.7 mg/dL — ABNORMAL HIGH (ref 1.7–2.4)

## 2019-05-10 LAB — PROTIME-INR
INR: 1 (ref 0.8–1.2)
Prothrombin Time: 12.6 seconds (ref 11.4–15.2)

## 2019-05-10 LAB — APTT: aPTT: 27 seconds (ref 24–36)

## 2019-05-10 LAB — AMMONIA: Ammonia: 21 umol/L (ref 9–35)

## 2019-05-10 MED ORDER — LACTATED RINGERS IV SOLN
INTRAVENOUS | Status: DC
  Administered 2019-05-10: 15:00:00 via INTRAVENOUS

## 2019-05-10 MED ORDER — SODIUM CHLORIDE 0.9 % IV SOLN
1.0000 g | Freq: Once | INTRAVENOUS | Status: AC
  Administered 2019-05-10: 1 g via INTRAVENOUS
  Filled 2019-05-10: qty 10

## 2019-05-10 MED ORDER — CEPHALEXIN 500 MG PO CAPS: 500.0000 mg | ORAL_CAPSULE | Freq: Three times a day (TID) | ORAL | 0 refills | Status: DC

## 2019-05-10 NOTE — Assessment & Plan Note (Signed)
Patient with apparent failure to thrive also with neurological symptoms with leg numbness and reported hand shaking.  I discussed with the patient and his wife that his current situation is very difficult to evaluate appropriately through a virtual visit.  I discussed that he could potentially have a neurological issue, renal issue, and electrolyte issues such as hyponatremia, or some other serious medical condition that is causing his current symptoms.  I discussed that he would likely need imaging and lab work to evaluate further.  I advised that he be evaluated in person in the emergency department given his significant issues with ambulating and his numbness as well as his failure to thrive.  The patient and his wife both noted that they would be willing to do that and that they would contact EMS to transport him.

## 2019-05-10 NOTE — ED Notes (Addendum)
Patient tolerated food and drink w/o any food or difficulties. Patient ambulated in hallway with a steady gate and 98%-100% SpO2.

## 2019-05-10 NOTE — ED Notes (Signed)
Patient was given coke and sandwich.

## 2019-05-10 NOTE — ED Triage Notes (Addendum)
Patient arrived by EMS from home. Pts wife stated that patient has onset of weakness x 2 days. Pt had a fall yesterday, no LOC or trauma.   Pt stopped Plavix yesterday per primary care request.   Pt Alert and Oriented x 3.  Pt spouse stated patient sleeps all day and has lost 5 pounds recently.   EMS stated patient had onset of chest pain. EMS was concerned about home situation. EMS reported dirty house and cockroaches.

## 2019-05-10 NOTE — Discharge Instructions (Signed)
You are seen in the ER for weakness. Work-up here reveals that you likely have a mild urinary infection and also possibly a small stone.  Recommend that you follow-up with your primary care doctor in 3-5 days. We recommend that you follow-up with urology in 1-2 weeks.  Please return to the ER if your symptoms worsen; you have increased pain, fevers, chills, inability to keep any medications down, confusion. Otherwise see the outpatient doctor as requested.

## 2019-05-10 NOTE — ED Provider Notes (Signed)
[facility] DEPT Provider Note   CSN: [id number] Arrival date & time: 05/10/19  1144     History   Chief Complaint Chief Complaint  Patient presents with  . Weakness    HPI Victor Davis is a 78 y.o. male.     HPI  78 year old male comes in a chief complaint of weakness. Patient here with his wife.  He has remote history of lung cancer and prostate cancer, stroke, hyperlipidemia.  According to patient's wife, patient has been feeling weak for the last week or so.  He has been less active and gets tired easily.  He is sleeping a lot more than usual.  His p.o. intake has come down as well.  Patient had a fall yesterday.  Patient reports that he indeed is feeling more tired than usual but denies any fevers, chills, severe headaches, chest pain, cough, shortness of breath, UTI-like symptoms, blood in the stool or urine.  Past Medical History:  Diagnosis Date  . Anemia, unspecified   . Cancer ([place])    lung and prostate cancer per wife  . Carotid artery disease ([place])   . CKD (chronic kidney disease), stage IV ([place])   . Coronary artery disease    a. DES to LAD and Cx 02/2012.  [name] Dementia ([place])   . Dysuria 10/10/2010  . Emphysema 04/01/2012   By CT chest, august 2013  . ERECTILE DYSFUNCTION 04/05/2007  . GERD (gastroesophageal reflux disease)   . GI bleeding   . HYPERLIPIDEMIA 07/30/2008  . HYPERSOMNIA 10/02/2007  . Impaired glucose tolerance 02/09/2011  . INSOMNIA-SLEEP DISORDER-UNSPEC 10/22/2009  . LOW BACK PAIN 04/05/2007  . OSTEOARTHRITIS 04/05/2007  . PEPTIC ULCER DISEASE 04/05/2007  . PERIPHERAL EDEMA 10/22/2009  . PROSTATE CANCER, HX OF 04/05/2007  . RESTLESS LEG SYNDROME 05/15/2007  . RHINITIS, ALLERGIC NOS 05/15/2007  . Sinus bradycardia 10/24/2010   a. prior h/o HR 40s, clonidine stopped 05/2017 due to this.  . Stroke (cerebrum) ([place])   . Unspecified visual loss 07/15/2008  . UTI 10/24/2010    Patient Active Problem List   Diagnosis Date  Noted  . Failure to thrive in adult 05/10/2019  . Allergic conjunctivitis 02/24/2019  . Gait disorder 12/24/2018  . Weight loss 12/24/2018  . Thrombocytopenia ([place]) 05/28/2018  . Arm bruise 04/01/2018  . Carotid stenosis 01/28/2018  . Symptomatic anemia 11/26/2017  . Depression 06/20/2017  . Bradycardia/Falls 06/08/2017  . Head injury without skull fracture/S/p Fall with Lt Frontal Laceration 06/08/2017  . Sinus bradycardia 06/08/2017  . B12 deficiency 04/11/2017  . CKD (chronic kidney disease), stage III ([place]) 02/04/2016  . Snoring 01/21/2016  . Dementia ([place]) 10/08/2015  . Multiple bruises 10/08/2015  . Non-small cell carcinoma of right lung, stage 1 ([place]) 04/29/2015  . Lung nodule 03/22/2015  . Cholecystostomy drain infection ([place]) 01/25/2015  . Abdominal pain 01/18/2015  . Cholecystitis 01/18/2015  . Acute on chronic kidney failure ([place]) 01/18/2015  . Right upper quadrant pain   . Acute cholecystitis   . Renal cyst 12/31/2012  . Hearing loss 07/05/2012  . Lesion of nose 07/05/2012  . Anemia, unspecified 04/02/2012  . Pulmonary emphysema ([place]) 04/01/2012  . Hypertension 03/09/2012  . Pulmonary nodule, right 02/08/2012  . Mitral regurgitation 02/06/2012  . CAD (coronary artery disease) 01/04/2012  . Unspecified disorder of liver 12/18/2011  . Hyperthyroidism 12/18/2011  . Nonspecific abnormal findings on radiological and examination of lung field 12/18/2011  . Impaired glucose tolerance 02/09/2011  . Encounter for well  adult exam with abnormal findings 02/09/2011  . INSOMNIA-SLEEP DISORDER-UNSPEC 10/22/2009  . PERIPHERAL EDEMA 10/22/2009  . Hyperlipidemia 07/30/2008  . Peripheral vascular disease ([place]) 07/30/2008  . 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 Unspecified visual loss 07/15/2008  . SHOULDER PAIN, LEFT 01/22/2008  . Hypersomnia 10/02/2007  . RESTLESS LEG SYNDROME 05/15/2007  . RHINITIS, ALLERGIC NOS 05/15/2007  . ERECTILE DYSFUNCTION 04/05/2007  . PEPTIC  ULCER DISEASE 04/05/2007  . Osteoarthritis 04/05/2007  . LOW BACK PAIN 04/05/2007  . PROSTATE CANCER, HX OF 04/05/2007    Past Surgical History:  Procedure Laterality Date  . CARDIAC CATHETERIZATION    . COLONOSCOPY N/A 11/28/2017   Procedure: COLONOSCOPY;  Surgeon: Yetta Flock, MD;  Location: [facility] ENDOSCOPY;  Service: Gastroenterology;  Laterality: N/A;  . ESOPHAGOGASTRODUODENOSCOPY N/A 11/28/2017   Procedure: ESOPHAGOGASTRODUODENOSCOPY (EGD);  Surgeon: Yetta Flock, MD;  Location: [facility];  Service: Gastroenterology;  Laterality: N/A;  . EYE SURGERY     right  . Inguinal herniorrhapy left  2002   x 2  . Left hip replacement    . LYMPH NODE DISSECTION Right 03/22/2015   Procedure: LYMPH NODE DISSECTION;  Surgeon: Melrose Nakayama, MD;  Location: [facility];  Service: Thoracic;  Laterality: Right;  . PERCUTANEOUS CORONARY STENT INTERVENTION (PCI-S) N/A 03/08/2012   Procedure: PERCUTANEOUS CORONARY STENT INTERVENTION (PCI-S);  Surgeon: Burnell Blanks, MD;  Location: [facility] CATH LAB;  Service: Cardiovascular;  Laterality: N/A;  . PROSTATECTOMY  2002   radical  . Right hip replacement  09/22/09  . ROTATOR CUFF REPAIR     left  . s/p ventral surgery  2009  . SEGMENTECOMY Right 03/22/2015   Procedure: RIGHT LOWER LOBE SUPERIOR SEGMENTECTOMY;  Surgeon: Melrose Nakayama, MD;  Location: [facility];  Service: Thoracic;  Laterality: Right;  [name] VIDEO ASSISTED THORACOSCOPY Right 03/22/2015   Procedure: RIGHT VIDEO ASSISTED THORACOSCOPY;  Surgeon: Melrose Nakayama, MD;  Location: [facility];  Service: Thoracic;  Laterality: Right;        Home Medications    Prior to Admission medications   Medication Sig Start Date End Date Taking? Authorizing Provider  abiraterone acetate (ZYTIGA) 500 MG tablet Take 1,000 mg by mouth daily. Take on an empty stomach 1 hour before or 2 hours after a meal.    [provider]  amLODipine (NORVASC) 5 MG tablet Take 1 tablet (5 mg total) by  mouth daily. 02/24/19 02/24/20  Biagio Borg, MD  Ascorbic Acid (VITAMIN C) 500 MG CAPS Take 500 mg by mouth daily.     [provider]  aspirin 81 MG chewable tablet Chew 1 tablet (81 mg total) by mouth daily. Start from 4/22 12/03/17   Florencia Reasons, MD  azelastine (OPTIVAR) 0.05 % ophthalmic solution Place 1 drop into both eyes 2 (two) times daily. 02/24/19   Biagio Borg, MD  cephALEXin (KEFLEX) 500 MG capsule Take 1 capsule (500 mg total) by mouth 3 (three) times daily. 05/10/19   Varney Biles, MD  citalopram (CELEXA) 20 MG tablet Take 1 tablet (20 mg total) by mouth daily. 01/01/19   Biagio Borg, MD  donepezil (ARICEPT) 5 MG tablet TAKE 1 TABLET BY MOUTH EVERYDAY AT BEDTIME 03/24/19   Biagio Borg, MD  ferrous sulfate 325 (65 FE) MG tablet Take 325 mg by mouth daily.  04/04/18   [provider]  gabapentin (NEURONTIN) 300 MG capsule TAKE 1 CAPSULE BY MOUTH TWICE A DAY 03/12/19   Biagio Borg, MD  hydrALAZINE (  APRESOLINE) 25 MG tablet TAKE 1 TABLET (25 MG TOTAL) BY MOUTH EVERY 8 (EIGHT) HOURS. 04/17/19   Biagio Borg, MD  isosorbide mononitrate (IMDUR) 30 MG 24 hr tablet Take 1 tablet (30 mg total) by mouth daily. 03/11/19   Biagio Borg, MD  lovastatin (MEVACOR) 40 MG tablet Take 1 tablet (40 mg total) by mouth at bedtime. -- Office visit needed for further refills 09/27/18   Biagio Borg, MD  Magnesium 500 MG TABS Take 500 mg by mouth daily.     [provider]  meclizine (ANTIVERT) 25 MG tablet Take 0.5-1 tablets (12.5-25 mg total) by mouth as needed for dizziness. TAKE 1/2 - 1 TABLET BY MOUTH THREE TIMES A DAY AS NEEDED FOR DIZINESS Patient taking differently: Take 12.5-25 mg by mouth 3 (three) times daily as needed for dizziness.  02/24/19   Biagio Borg, MD  nitroGLYCERIN (NITROSTAT) 0.4 MG SL tablet Place 1 tablet (0.4 mg total) under the tongue every 5 (five) minutes as needed for chest pain. 07/29/18   Burnell Blanks, MD  pantoprazole (PROTONIX) 40 MG  tablet TAKE 1 TABLET BY MOUTH EVERY DAY Patient taking differently: Take 40 mg by mouth daily.  01/01/19   Biagio Borg, MD  pramipexole (MIRAPEX) 0.5 MG tablet Take 1 tablet (0.5 mg total) by mouth 3 (three) times daily. 07/10/18   Biagio Borg, MD  predniSONE (DELTASONE) 5 MG tablet Take 5 mg daily with breakfast by mouth.    [provider]  thiamine (VITAMIN B-1) 50 MG tablet Take 1 tablet (50 mg total) by mouth daily. Patient taking differently: Take 100 mg by mouth daily.  11/28/17   Florencia Reasons, MD    Family History Family History  Problem Relation Age of Onset  . Heart attack Father 9       Smoker  . Heart attack Brother 74  . Lung cancer Sister   . Colon cancer Neg Hx     Social History Social History   Tobacco Use  . Smoking status: Former Smoker    Packs/day: 1.00    Years: 15.00    Pack years: 15.00    Types: Cigarettes    Quit date: 02/05/1977    Years since quitting: 42.2  . Smokeless tobacco: Never Used  Substance Use Topics  . Alcohol use: No    Alcohol/week: 1.0 standard drinks    Types: 1 Standard drinks or equivalent per week    Comment: RARE  . Drug use: No     Allergies   Amitriptyline hcl, Diphenhydramine hcl, Simvastatin, Clonidine derivatives, and Tylenol [acetaminophen]   Review of Systems Review of Systems  Constitutional: Positive for activity change.  Respiratory: Negative for shortness of breath.   Cardiovascular: Negative for chest pain.  Gastrointestinal: Negative for nausea and vomiting.  Genitourinary: Negative for dysuria.  Allergic/Immunologic: Negative for immunocompromised state.  Hematological: Does not bruise/bleed easily.  All other systems reviewed and are negative.    Physical Exam Updated Vital Signs BP (!) 159/77   Pulse 63   Temp 97.7 F (36.5 C) (Oral)   Resp 14   Ht 5\' 9"  (1.753 m)   Wt 57.5 kg   SpO2 97%   BMI 18.72 kg/m   Physical Exam Vitals signs and nursing note reviewed.  Constitutional:       Appearance: He is well-developed.     Comments: Somnolent  HENT:     Head: Atraumatic.  Eyes:     Conjunctiva/sclera:  Conjunctivae normal.     Pupils: Pupils are equal, round, and reactive to light.  Neck:     Musculoskeletal: Normal range of motion and neck supple.  Cardiovascular:     Rate and Rhythm: Normal rate and regular rhythm.  Pulmonary:     Effort: Pulmonary effort is normal.     Breath sounds: Normal breath sounds.  Abdominal:     General: Bowel sounds are normal. There is no distension.     Palpations: Abdomen is soft. There is no mass.     Tenderness: There is no abdominal tenderness. There is no guarding or rebound.  Musculoskeletal:        General: No deformity.  Skin:    General: Skin is warm.  Neurological:     Mental Status: He is oriented to person, place, and time.      ED Treatments / Results  Labs (all labs ordered are listed, but only abnormal results are displayed) Labs Reviewed  COMPREHENSIVE METABOLIC PANEL - Abnormal; Notable for the following components:      Result Value   Glucose, Bld 106 (*)    BUN 41 (*)    Creatinine, Ser 2.27 (*)    AST 14 (*)    GFR calc non Af Amer 26 (*)    GFR calc Af Amer 31 (*)    All other components within normal limits  CBC WITH DIFFERENTIAL/PLATELET - Abnormal; Notable for the following components:   RBC 2.74 (*)    Hemoglobin 8.2 (*)    HCT 27.3 (*)    Platelets 139 (*)    Lymphs Abs 0.5 (*)    All other components within normal limits  URINALYSIS, COMPLETE (UACMP) WITH MICROSCOPIC - Abnormal; Notable for the following components:   APPearance CLOUDY (*)    pH 9.0 (*)    Hgb urine dipstick LARGE (*)    Protein, ur 100 (*)    Leukocytes,Ua MODERATE (*)    Bacteria, UA RARE (*)    All other components within normal limits  MAGNESIUM - Abnormal; Notable for the following components:   Magnesium 2.7 (*)    All other components within normal limits  URINE CULTURE  AMMONIA  PROTIME-INR  APTT   PHOSPHORUS    EKG EKG Interpretation  Date/Time:  Saturday May 10 2019 12:00:43 EDT Ventricular Rate:  61 PR Interval:    QRS Duration: 107 QT Interval:  440 QTC Calculation: 444 R Axis:   33 Text Interpretation:  Sinus rhythm Borderline prolonged PR interval Anteroseptal infarct, old No acute changes No significant change since last tracing Confirmed by Varney Biles [id number]) on 05/10/2019 1:07:11 PM   Radiology Dg Chest 2 View  Result Date: 05/10/2019 CLINICAL DATA:  Rule out pneumonia, weakness EXAM: CHEST - 2 VIEW COMPARISON:  02/25/2019 FINDINGS: The heart size and mediastinal contours are within normal limits. Coronary stents. Aortic atherosclerosis. Both lungs are clear. Redemonstrated postoperative findings of right lower lobe wedge resection. Disc degenerative disease of the thoracic spine. IMPRESSION: No acute abnormality of the lungs. Electronically Signed   By: Eddie Candle M.D.   On: 05/10/2019 13:55   Ct Head Wo Contrast  Result Date: 05/10/2019 CLINICAL DATA:  Weakness for 2 days.  Fall yesterday. EXAM: CT HEAD WITHOUT CONTRAST TECHNIQUE: Contiguous axial images were obtained from the base of the skull through the vertex without intravenous contrast. COMPARISON:  February 25, 2019 FINDINGS: Brain: No subdural, epidural, or subarachnoid hemorrhage. Chronic white matter changes are noted. Left greater than  right a septal infarcts are again identified. Infarcts are seen in the left cerebellar hemisphere, also stable. The cerebellum is otherwise unchanged. A lacunar infarct is seen within the pons, stable. The brainstem is otherwise normal. Basal cisterns are patent. Ventricles and sulci are prominent but stable. No mass effect or midline shift. No acute cortical ischemia is identified. Vascular: Calcified atherosclerosis is seen in the intracranial carotids. Skull: Normal. Negative for fracture or focal lesion. Sinuses/Orbits: No acute finding. Other: None. IMPRESSION: Chronic  white matter changes and infarcts as above. No acute intracranial abnormalities identified. Electronically Signed   By: Dorise Bullion III M.D   On: 05/10/2019 13:52   Ct Renal Stone Study  Result Date: 05/10/2019 CLINICAL DATA:  Weakness for 2 days.  Fall.  Hematuria. EXAM: CT ABDOMEN AND PELVIS WITHOUT CONTRAST TECHNIQUE: Multidetector CT imaging of the abdomen and pelvis was performed following the standard protocol without IV contrast. COMPARISON:  Multiple CT scans since January 17, 2015 FINDINGS: Lower chest: No acute abnormality. Hepatobiliary: The liver is unremarkable. Cholelithiasis is identified without wall thickening. Pancreas: Unremarkable. No pancreatic ductal dilatation or surrounding inflammatory changes. Spleen: Normal in size without focal abnormality. Adrenals/Urinary Tract: A 2 mm stone is seen in the left kidney on coronal image 66. Bilateral renal cysts are noted. There is an unusual contour to the inferior left kidney. This was described as a complex cyst on a previous study from June of 2016. This lesion demonstrates an attenuation of 31 Hounsfield units today but is much smaller when compared to June of 2016 suggesting a benign etiology. Mild hydronephrosis and ureterectasis on the right. The distal right ureter is obscured by streak artifact off hip replacements. The left ureter is normal in caliber. No hydronephrosis on the left. No perinephric stranding adjacent to either kidney. Visualized portions of the bladder are normal. Stomach/Bowel: The stomach and small bowel are normal. A portion of the sigmoid colon is obscured by streak artifact off the hip replacements. Colonic diverticulosis is identified, particularly in the descending and sigmoid colon. The remainder of the colon is normal. No evidence of appendicitis. Vascular/Lymphatic: Dense atherosclerotic changes are seen in the nonaneurysmal aorta, iliac vessels, and femoral vessels. No adenopathy. Reproductive: Previous  prostatectomy. Prostate bed is obscured by streak artifact. Other: No abdominal wall hernia or abnormality. No abdominopelvic ascites. Musculoskeletal: Bilateral hip replacements are identified. Bones are stable since April 01, 2019. IMPRESSION: 1. There is mild right hydronephrosis and ureterectasis. The distal most right ureter is obscured by streak artifact off hip replacements. The findings could represent either a stone in the distal right ureter or a recently passed stone as the finding is new when compared April 01, 2019. 2. There is an unusual contour to the inferior left kidney. A complex lesion was seen in this region on previous studies. While the finding is nonspecific on on today's study, the fact that the complex lesion seen in 2016 is significantly smaller today suggests a benign etiology. 3. 2 mm stone in the left kidney. 4. Colonic diverticulosis without diverticulitis. Atherosclerotic changes in the nonaneurysmal aorta and branching vessels. Electronically Signed   By: Dorise Bullion III M.D   On: 05/10/2019 17:19    Procedures Procedures (including critical care time)  Medications Ordered in ED Medications  lactated ringers infusion ( Intravenous New Bag/Given 05/10/19 1432)  cefTRIAXone (ROCEPHIN) 1 g in sodium chloride 0.9 % 100 mL IVPB (1 g Intravenous New Bag/Given 05/10/19 1609)     Initial Impression / Assessment and  Plan / ED Course  I have reviewed the triage vital signs and the nursing notes.  Pertinent labs & imaging results that were available during my care of the patient were reviewed by me and considered in my medical decision making (see chart for details).  Clinical Course as of May 09 1754  Sat May 10, 2019  1720 UA is positive for leukocytes, RBC, WBC and rare bacteria.  CT renal stone ordered.   Results discussed with patient's wife.  She is comfortable taking him home if work-up here is negative and we suspect UTI as a leading cause for his  symptoms.  Strict ER return precautions have been discussed, and patient is agreeing with the plan and is comfortable with the workup done and the recommendations from the ER.    [place] GuestMarland Kitchen): MODERATE [AN]  0354 Questionable stone over the right ureters and mild right hydronephrosis Otherwise CT renal stone is reassuring. Advised family to follow-up with PCP in 1 week and urology in 2 weeks.  Strict ER return precautions again discussed.  CT Renal Stone Study [AN]    Clinical Course User Index [AN] Varney Biles, MD       DDx includes: ICH / Stroke ACS Sepsis syndrome Infection - UTI/Pneumonia Encephalopathy  Electrolyte abnormality Drug overdose DKA Metabolic disorders including thyroid disorders, adrenal insufficiency Cancer of unknown origin / paraneoplastic process  78 year old comes in a chief complaint of weakness. History and exam is not indicative of any specific source or cause for the decline. He has chronic medical conditions, initial thought is that he might be having significant electrolyte disturbance due to renal failure.  We will also get chest x-ray, UA and a CT head.  CT head because patient had a fall yesterday.    Final Clinical Impressions(s) / ED Diagnoses   Final diagnoses:  Generalized weakness  Hematuria, unspecified type  Nephrolithiasis  Lower urinary tract infectious disease    ED Discharge Orders         Ordered    cephALEXin (KEFLEX) 500 MG capsule  3 times daily     05/10/19 [place], Kynzee Devinney, MD 05/10/19 1755
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
## 2019-05-12 ENCOUNTER — Telehealth: Payer: Self-pay | Admitting: Internal Medicine

## 2019-05-12 ENCOUNTER — Telehealth: Payer: Self-pay | Admitting: *Deleted

## 2019-05-12 NOTE — Telephone Encounter (Signed)
Patients spouse spoke with [facility] [employer] on 05/10/19 at 10:22am.  Stated that patients hands were shaking and that legs were going to sleep.  States he has lost weight and too weak to walk.  Looks like patient did go to ED.

## 2019-05-12 NOTE — Telephone Encounter (Signed)
Monitor that was enrolled 02/26/2019,[id number],  was never baselined.  There will not be a charge for this monitor if equipment is returned in [place], (blue box monitor was delivered in).  Please put all equipment in blue box, peel strip to expose adhesive and seal.  Call [employer] at [phone] to schedule UPS to pick monitor up at your home.

## 2019-05-12 NOTE — Telephone Encounter (Signed)
Noted  

## 2019-05-13 ENCOUNTER — Other Ambulatory Visit: Payer: Self-pay | Admitting: Internal Medicine

## 2019-05-14 LAB — URINE CULTURE: Culture: >=100000 — AB

## 2019-05-15 ENCOUNTER — Telehealth: Payer: Self-pay | Admitting: Licensed Clinical Social Worker

## 2019-05-15 ENCOUNTER — Telehealth: Payer: Self-pay

## 2019-05-15 NOTE — Telephone Encounter (Signed)
Post ED Visit - Positive Culture Follow-up  Culture report reviewed by antimicrobial stewardship pharmacist: [place] Team [x]  Elenor Quinones, Pharm.D. []  Heide Guile, Pharm.D., BCPS AQ-ID []  Parks Neptune, Pharm.D., BCPS []  Alycia Rossetti, Pharm.D., BCPS []  [place], Pharm.D., BCPS, AAHIVP []  Legrand Como, Pharm.D., BCPS, AAHIVP []  Salome Arnt, PharmD, BCPS []  Johnnette Gourd, PharmD, BCPS []  Hughes Better, PharmD, BCPS []  Leeroy Cha, PharmD []  Laqueta Linden, PharmD, BCPS []  Albertina Parr, PharmD  [place] Team []  Leodis Sias, PharmD []  Lindell Spar, PharmD []  Royetta Asal, PharmD []  Graylin Shiver, Rph []  Rema Fendt) Glennon Mac, PharmD []  Arlyn Dunning, PharmD []  Netta Cedars, PharmD []  Dia Sitter, PharmD []  Leone Haven, PharmD []  Gretta Arab, PharmD []  Theodis Shove, PharmD []  Peggyann Juba, PharmD []  Reuel Boom, PharmD   Positive urine culture Treated with Cephalexin, organism sensitive to the same and no further patient follow-up is required at this time.  Genia Del 05/15/2019, 10:32 AM

## 2019-05-15 NOTE — Telephone Encounter (Signed)
CSW referred to assist with community resources to assist with home environment. CSW spoke with patient's wife who states "we have to get rid of these bugs in the house". She went on to share that she has to clean up the house before the exterminator can come and she gets "so tired that I have to sit down and don't get much done". She also noted that "we have a bathroom problem too'. Wife states they only get $2,200 a month in income and they own the home. She provided CSW with APS worker Christel Mormon [phone] and apparently has already been referred to [employer] [phone]). Wife sounds overwhelmed with the whole process. CSW will reach out to other workers and try to collaborate efforts to assist this couple. Wife grateful for any assistance. Victor Davis, [place], [place]

## 2019-05-16 ENCOUNTER — Telehealth: Payer: Self-pay | Admitting: Licensed Clinical Social Worker

## 2019-05-16 NOTE — Telephone Encounter (Signed)
CSW spoke with Ms Ouida Sills at [facility] who reports they received a referral but not actually involved. She stated that the patient's wife reported that they have bed bugs and need to get the house cleaned up before pursuing any extermination. Ms Ouida Sills stated that the patient plans to work with her sister for house cleaning and eventually get some extermination. CSW shared the information obtained from wife from yesterdays conversation. APS worker will explore house cleaning companies they have used and return call to [place]. At this time, APS is not involved and reports there are no funding sources for bed bugs any where in the city. CSW will continue to follow and explore for options for cleaning and extermination. Raquel Sarna, [place], [employer]

## 2019-05-23 ENCOUNTER — Ambulatory Visit (HOSPITAL_COMMUNITY)
Admission: RE | Admit: 2019-05-23 | Discharge: 2019-05-23 | Disposition: A | Discharge status: Home / Self Care | Payer: Medicare Other | Source: Ambulatory Visit | Attending: Internal Medicine | Admitting: Internal Medicine

## 2019-05-23 ENCOUNTER — Encounter (HOSPITAL_COMMUNITY): Payer: Self-pay

## 2019-05-23 ENCOUNTER — Inpatient Hospital Stay: Payer: Medicare Other | Attending: Internal Medicine

## 2019-05-23 ENCOUNTER — Other Ambulatory Visit: Payer: Self-pay

## 2019-05-23 DIAGNOSIS — M1990 Unspecified osteoarthritis, unspecified site: Secondary | ICD-10-CM | POA: Diagnosis not present

## 2019-05-23 DIAGNOSIS — C3490 Malignant neoplasm of unspecified part of unspecified bronchus or lung: Secondary | ICD-10-CM | POA: Insufficient documentation

## 2019-05-23 DIAGNOSIS — D649 Anemia, unspecified: Secondary | ICD-10-CM | POA: Insufficient documentation

## 2019-05-23 DIAGNOSIS — C61 Malignant neoplasm of prostate: Secondary | ICD-10-CM | POA: Diagnosis present

## 2019-05-23 DIAGNOSIS — I2510 Atherosclerotic heart disease of native coronary artery without angina pectoris: Secondary | ICD-10-CM | POA: Insufficient documentation

## 2019-05-23 DIAGNOSIS — Z7982 Long term (current) use of aspirin: Secondary | ICD-10-CM | POA: Insufficient documentation

## 2019-05-23 DIAGNOSIS — F0390 Unspecified dementia without behavioral disturbance: Secondary | ICD-10-CM | POA: Diagnosis not present

## 2019-05-23 DIAGNOSIS — J439 Emphysema, unspecified: Secondary | ICD-10-CM | POA: Insufficient documentation

## 2019-05-23 DIAGNOSIS — N184 Chronic kidney disease, stage 4 (severe): Secondary | ICD-10-CM | POA: Diagnosis not present

## 2019-05-23 DIAGNOSIS — Z79899 Other long term (current) drug therapy: Secondary | ICD-10-CM | POA: Diagnosis not present

## 2019-05-23 DIAGNOSIS — E785 Hyperlipidemia, unspecified: Secondary | ICD-10-CM | POA: Diagnosis not present

## 2019-05-23 DIAGNOSIS — G4710 Hypersomnia, unspecified: Secondary | ICD-10-CM | POA: Diagnosis not present

## 2019-05-23 DIAGNOSIS — K219 Gastro-esophageal reflux disease without esophagitis: Secondary | ICD-10-CM | POA: Insufficient documentation

## 2019-05-23 LAB — CBC WITH DIFFERENTIAL (CANCER CENTER ONLY)
Abs Immature Granulocytes: 0.03 10*3/uL (ref 0.00–0.07)
Basophils Absolute: 0 10*3/uL (ref 0.0–0.1)
Basophils Relative: 0 %
Eosinophils Absolute: 0.1 10*3/uL (ref 0.0–0.5)
Eosinophils Relative: 1 %
HCT: 25.6 % — ABNORMAL LOW (ref 39.0–52.0)
Hemoglobin: 8 g/dL — ABNORMAL LOW (ref 13.0–17.0)
Immature Granulocytes: 1 %
Lymphocytes Relative: 15 %
Lymphs Abs: 1 10*3/uL (ref 0.7–4.0)
MCH: 30.3 pg (ref 26.0–34.0)
MCHC: 31.3 g/dL (ref 30.0–36.0)
MCV: 97 fL (ref 80.0–100.0)
Monocytes Absolute: 0.3 10*3/uL (ref 0.1–1.0)
Monocytes Relative: 5 %
Neutro Abs: 5.1 10*3/uL (ref 1.7–7.7)
Neutrophils Relative %: 78 %
Platelet Count: 149 10*3/uL — ABNORMAL LOW (ref 150–400)
RBC: 2.64 MIL/uL — ABNORMAL LOW (ref 4.22–5.81)
RDW: 13.2 % (ref 11.5–15.5)
WBC Count: 6.5 10*3/uL (ref 4.0–10.5)
nRBC: 0 % (ref 0.0–0.2)

## 2019-05-23 LAB — CMP (CANCER CENTER ONLY)
ALT: <6 U/L (ref 0–44)
AST: 14 U/L — ABNORMAL LOW (ref 15–41)
Albumin: 3.2 g/dL — ABNORMAL LOW (ref 3.5–5.0)
Alkaline Phosphatase: 79 U/L (ref 38–126)
Anion gap: 9 (ref 5–15)
BUN: 40 mg/dL — ABNORMAL HIGH (ref 8–23)
CO2: 24 mmol/L (ref 22–32)
Calcium: 8.8 mg/dL — ABNORMAL LOW (ref 8.9–10.3)
Chloride: 107 mmol/L (ref 98–111)
Creatinine: 2.15 mg/dL — ABNORMAL HIGH (ref 0.61–1.24)
GFR, Est AFR Am: 33 mL/min — ABNORMAL LOW (ref >60)
GFR, Estimated: 28 mL/min — ABNORMAL LOW (ref >60)
Glucose, Bld: 122 mg/dL — ABNORMAL HIGH (ref 70–99)
Potassium: 4.4 mmol/L (ref 3.5–5.1)
Sodium: 140 mmol/L (ref 135–145)
Total Bilirubin: 0.7 mg/dL (ref 0.3–1.2)
Total Protein: 6.4 g/dL — ABNORMAL LOW (ref 6.5–8.1)

## 2019-05-26 ENCOUNTER — Telehealth: Payer: Self-pay | Admitting: Licensed Clinical Social Worker

## 2019-05-26 NOTE — Telephone Encounter (Signed)
CSW contacted patient's wife to follow up on cleaning service. Wife reports she "has been cleaning up myself because I don't want to have to pay anyone". She reported she has removed newspaper and other clutter from the home. She states she has been working hard in the living room and will move on the kitchen. She states it is getting there and she prefers to do it herself. She is hopeful to have the exterminators pending the cleaning up of the house. CSW offered to assist although she denies at this time but open to ongoing communication with CSW. Wife appears to be making some progress although not open to assistance at this time in the home environment. Raquel Sarna, [place], [place]

## 2019-05-27 ENCOUNTER — Other Ambulatory Visit: Payer: Self-pay | Admitting: Internal Medicine

## 2019-05-27 ENCOUNTER — Encounter: Payer: Medicare Other | Admitting: Thoracic Surgery (Cardiothoracic Vascular Surgery)

## 2019-05-28 ENCOUNTER — Other Ambulatory Visit: Payer: Self-pay

## 2019-05-28 ENCOUNTER — Encounter: Payer: Self-pay | Admitting: Internal Medicine

## 2019-05-28 ENCOUNTER — Telehealth: Payer: Self-pay | Admitting: Internal Medicine

## 2019-05-28 ENCOUNTER — Inpatient Hospital Stay (HOSPITAL_BASED_OUTPATIENT_CLINIC_OR_DEPARTMENT_OTHER): Payer: Medicare Other | Admitting: Internal Medicine

## 2019-05-28 VITALS — BP 156/66 | HR 79 | Temp 98.9°F | Resp 17 | Ht 69.0 in | Wt 128.6 lb

## 2019-05-28 DIAGNOSIS — N184 Chronic kidney disease, stage 4 (severe): Secondary | ICD-10-CM | POA: Diagnosis not present

## 2019-05-28 DIAGNOSIS — G4710 Hypersomnia, unspecified: Secondary | ICD-10-CM | POA: Diagnosis not present

## 2019-05-28 DIAGNOSIS — D649 Anemia, unspecified: Secondary | ICD-10-CM | POA: Diagnosis not present

## 2019-05-28 DIAGNOSIS — Z7982 Long term (current) use of aspirin: Secondary | ICD-10-CM | POA: Diagnosis not present

## 2019-05-28 DIAGNOSIS — C3490 Malignant neoplasm of unspecified part of unspecified bronchus or lung: Secondary | ICD-10-CM | POA: Diagnosis not present

## 2019-05-28 DIAGNOSIS — Z79899 Other long term (current) drug therapy: Secondary | ICD-10-CM | POA: Diagnosis not present

## 2019-05-28 DIAGNOSIS — M1990 Unspecified osteoarthritis, unspecified site: Secondary | ICD-10-CM | POA: Diagnosis not present

## 2019-05-28 DIAGNOSIS — I2510 Atherosclerotic heart disease of native coronary artery without angina pectoris: Secondary | ICD-10-CM | POA: Diagnosis not present

## 2019-05-28 DIAGNOSIS — J439 Emphysema, unspecified: Secondary | ICD-10-CM | POA: Diagnosis not present

## 2019-05-28 DIAGNOSIS — C3491 Malignant neoplasm of unspecified part of right bronchus or lung: Secondary | ICD-10-CM | POA: Diagnosis not present

## 2019-05-28 DIAGNOSIS — E785 Hyperlipidemia, unspecified: Secondary | ICD-10-CM | POA: Diagnosis not present

## 2019-05-28 DIAGNOSIS — K219 Gastro-esophageal reflux disease without esophagitis: Secondary | ICD-10-CM | POA: Diagnosis not present

## 2019-05-28 NOTE — Progress Notes (Signed)
[facility] [phone]   [phone]  OFFICE PROGRESS NOTE  Biagio Borg, MD [place] [address]  DIAGNOSIS:  1) Stage IA (T1a, N0, M0) non-small cell lung cancer, well-differentiated adenocarcinoma diagnosed in July 2016. 2) history of prostate adenocarcinoma managed by Dr. Tresa [facility]  PRIOR THERAPY: Status post right lower lobe superior segmentectomy with lymph node sampling under the care of Dr. Roxan Hockey on 03/22/2015.  CURRENT THERAPY: Observation.  INTERVAL HISTORY: Victor Davis 78 y.o. male returns to the clinic today for follow-up visit.  His wife was available by phone.  The patient is feeling fine today with no concerning complaints.  He denied having any chest pain, shortness of breath, cough or hemoptysis.  He denied having any fever or chills.  He has no nausea, vomiting, diarrhea or constipation.  He has no headache or visual changes.  Unfortunately he has a lot of bedbugs on him today.  He had repeat CT scan of the chest performed recently and he is here today for evaluation and discussion of his discuss results.  MEDICAL HISTORY: Past Medical History:  Diagnosis Date   Anemia, unspecified    Cancer ([place])    lung and prostate cancer per wife   Carotid artery disease ([place])    CKD (chronic kidney disease), stage IV ([facility])    Coronary artery disease    a. DES to LAD and Cx 02/2012.   Dementia ([place])    Dysuria 10/10/2010   Emphysema 04/01/2012   By CT chest, august 2013   ERECTILE DYSFUNCTION 04/05/2007   GERD (gastroesophageal reflux disease)    GI bleeding    HYPERLIPIDEMIA 07/30/2008   HYPERSOMNIA 10/02/2007   Impaired glucose tolerance 02/09/2011   INSOMNIA-SLEEP DISORDER-UNSPEC 10/22/2009   LOW BACK PAIN 04/05/2007   OSTEOARTHRITIS 04/05/2007   PEPTIC ULCER DISEASE 04/05/2007   PERIPHERAL EDEMA 10/22/2009   PROSTATE CANCER, HX OF 04/05/2007   RESTLESS LEG SYNDROME  05/15/2007   RHINITIS, ALLERGIC NOS 05/15/2007   Sinus bradycardia 10/24/2010   a. prior h/o HR 40s, clonidine stopped 05/2017 due to this.   Stroke (cerebrum) ([place])    Unspecified visual loss 07/15/2008   UTI 10/24/2010    ALLERGIES:  is allergic to amitriptyline hcl; diphenhydramine hcl; simvastatin; clonidine derivatives; and tylenol [acetaminophen].  MEDICATIONS:  Current Outpatient Medications  Medication Sig Dispense Refill   abiraterone acetate (ZYTIGA) 500 MG tablet Take 1,000 mg by mouth daily. Take on an empty stomach 1 hour before or 2 hours after a meal.     amLODipine (NORVASC) 5 MG tablet Take 1 tablet (5 mg total) by mouth daily. 90 tablet 3   Ascorbic Acid (VITAMIN C) 500 MG CAPS Take 500 mg by mouth daily.      aspirin 81 MG chewable tablet Chew 1 tablet (81 mg total) by mouth daily. Start from 4/22 30 tablet 0   azelastine (OPTIVAR) 0.05 % ophthalmic solution Place 1 drop into both eyes 2 (two) times daily. 6 mL 12   cephALEXin (KEFLEX) 500 MG capsule Take 1 capsule (500 mg total) by mouth 3 (three) times daily. 30 capsule 0   citalopram (CELEXA) 20 MG tablet Take 1 tablet (20 mg total) by mouth daily. 90 tablet 1   donepezil (ARICEPT) 5 MG tablet TAKE 1 TABLET BY MOUTH EVERYDAY AT BEDTIME 90 tablet 0   ferrous sulfate 325 (65 FE) MG tablet Take 325 mg by mouth  daily.   1   gabapentin (NEURONTIN) 300 MG capsule TAKE 1 CAPSULE BY MOUTH TWICE A DAY 180 capsule 0   hydrALAZINE (APRESOLINE) 25 MG tablet TAKE 1 TABLET (25 MG TOTAL) BY MOUTH EVERY 8 (EIGHT) HOURS. 270 tablet 1   isosorbide mononitrate (IMDUR) 30 MG 24 hr tablet Take 1 tablet (30 mg total) by mouth daily. 90 tablet 1   lovastatin (MEVACOR) 40 MG tablet Take 1 tablet (40 mg total) by mouth at bedtime. -- Office visit needed for further refills 90 tablet 0   Magnesium 500 MG TABS Take 500 mg by mouth daily.      meclizine (ANTIVERT) 25 MG tablet Take 0.5-1 tablets (12.5-25 mg total) by mouth as  needed for dizziness. TAKE 1/2 - 1 TABLET BY MOUTH THREE TIMES A DAY AS NEEDED FOR DIZINESS (Patient taking differently: Take 12.5-25 mg by mouth 3 (three) times daily as needed for dizziness. ) 30 tablet 2   nitroGLYCERIN (NITROSTAT) 0.4 MG SL tablet Place 1 tablet (0.4 mg total) under the tongue every 5 (five) minutes as needed for chest pain. 25 tablet 3   pantoprazole (PROTONIX) 40 MG tablet TAKE 1 TABLET BY MOUTH EVERY DAY (Patient taking differently: Take 40 mg by mouth daily. ) 90 tablet 1   pramipexole (MIRAPEX) 0.5 MG tablet Take 1 tablet (0.5 mg total) by mouth 3 (three) times daily. 270 tablet 3   predniSONE (DELTASONE) 5 MG tablet Take 5 mg daily with breakfast by mouth.     thiamine (VITAMIN B-1) 50 MG tablet Take 1 tablet (50 mg total) by mouth daily. (Patient taking differently: Take 100 mg by mouth daily. ) 30 tablet 0   No current facility-administered medications for this visit.     SURGICAL HISTORY:  Past Surgical History:  Procedure Laterality Date   CARDIAC CATHETERIZATION     COLONOSCOPY N/A 11/28/2017   Procedure: COLONOSCOPY;  Surgeon: Yetta Flock, MD;  Location: [facility];  Service: Gastroenterology;  Laterality: N/A;   ESOPHAGOGASTRODUODENOSCOPY N/A 11/28/2017   Procedure: ESOPHAGOGASTRODUODENOSCOPY (EGD);  Surgeon: Yetta Flock, MD;  Location: [facility];  Service: Gastroenterology;  Laterality: N/A;   EYE SURGERY     right   Inguinal herniorrhapy left  2002   x 2   Left hip replacement     LYMPH NODE DISSECTION Right 03/22/2015   Procedure: LYMPH NODE DISSECTION;  Surgeon: Melrose Nakayama, MD;  Location: [facility];  Service: Thoracic;  Laterality: Right;   PERCUTANEOUS CORONARY STENT INTERVENTION (PCI-S) N/A 03/08/2012   Procedure: PERCUTANEOUS CORONARY STENT INTERVENTION (PCI-S);  Surgeon: Burnell Blanks, MD;  Location: [facility] C[facility];  Service: Cardiovascular;  Laterality: N/A;   PROSTATECTOMY  2002   radical   Right hip  replacement  09/22/09   ROTATOR CUFF REPAIR     left   s/p ventral surgery  2009   SEGMENTECOMY Right 03/22/2015   Procedure: RIGHT LOWER LOBE SUPERIOR SEGMENTECTOMY;  Surgeon: Melrose Nakayama, MD;  Location: [facility];  Service: Thoracic;  Laterality: Right;   VIDEO ASSISTED THORACOSCOPY Right 03/22/2015   Procedure: RIGHT VIDEO ASSISTED THORACOSCOPY;  Surgeon: Melrose Nakayama, MD;  Location: [facility];  Service: Thoracic;  Laterality: Right;    REVIEW OF SYSTEMS:  A comprehensive review of systems was negative except for: Constitutional: positive for fatigue Respiratory: positive for dyspnea on exertion   PHYSICAL EXAMINATION: General appearance: alert, cooperative, fatigued and no distress Head: Normocephalic, without obvious abnormality, atraumatic Neck: no adenopathy, no JVD, supple,  symmetrical, trachea midline and thyroid not enlarged, symmetric, no tenderness/mass/nodules Lymph nodes: Cervical, supraclavicular, and axillary nodes normal. Resp: clear to auscultation bilaterally Back: symmetric, no curvature. ROM normal. No CVA tenderness. Cardio: regular rate and rhythm, S1, S2 normal, no murmur, click, rub or gallop GI: soft, non-tender; bowel sounds normal; no masses,  no organomegaly Extremities: extremities normal, atraumatic, no cyanosis or edema  ECOG PERFORMANCE STATUS: 1 - Symptomatic but completely ambulatory  Blood pressure (!) 156/66, pulse 79, temperature 98.9 F (37.2 C), temperature source Temporal, resp. rate 17, height 5\' 9"  (1.753 m), weight 128 lb 9.6 oz (58.3 kg), SpO2 97 %.  LABORATORY DATA: Lab Results  Component Value Date   WBC 6.5 05/23/2019   HGB 8.0 (L) 05/23/2019   HCT 25.6 (L) 05/23/2019   MCV 97.0 05/23/2019   PLT 149 (L) 05/23/2019      Chemistry      Component Value Date/Time   NA 140 05/23/2019 1053   NA 145 (H) 02/24/2019 1415   NA 139 05/08/2017 0826   K 4.4 05/23/2019 1053   K 3.9 05/08/2017 0826   CL 107 05/23/2019 1053   CO2  24 05/23/2019 1053   CO2 25 05/08/2017 0826   BUN 40 (H) 05/23/2019 1053   BUN 38 (H) 02/24/2019 1415   BUN 27.5 (H) 05/08/2017 0826   CREATININE 2.15 (H) 05/23/2019 1053   CREATININE 1.6 (H) 05/08/2017 0826      Component Value Date/Time   CALCIUM 8.8 (L) 05/23/2019 1053   CALCIUM 9.7 05/08/2017 0826   ALKPHOS 79 05/23/2019 1053   ALKPHOS 76 05/08/2017 0826   AST 14 (L) 05/23/2019 1053   AST 14 05/08/2017 0826   ALT <6 05/23/2019 1053   ALT <6 05/08/2017 0826   BILITOT 0.7 05/23/2019 1053   BILITOT 0.77 05/08/2017 0826       RADIOGRAPHIC STUDIES: Dg Chest 2 View  Result Date: 05/10/2019 CLINICAL DATA:  Rule out pneumonia, weakness EXAM: CHEST - 2 VIEW COMPARISON:  02/25/2019 FINDINGS: The heart size and mediastinal contours are within normal limits. Coronary stents. Aortic atherosclerosis. Both lungs are clear. Redemonstrated postoperative findings of right lower lobe wedge resection. Disc degenerative disease of the thoracic spine. IMPRESSION: No acute abnormality of the lungs. Electronically Signed   By: Eddie Candle M.D.   On: 05/10/2019 13:55   Ct Head Wo Contrast  Result Date: 05/10/2019 CLINICAL DATA:  Weakness for 2 days.  Fall yesterday. EXAM: CT HEAD WITHOUT CONTRAST TECHNIQUE: Contiguous axial images were obtained from the base of the skull through the vertex without intravenous contrast. COMPARISON:  February 25, 2019 FINDINGS: Brain: No subdural, epidural, or subarachnoid hemorrhage. Chronic white matter changes are noted. 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 Infarcts are seen in the left cerebellar hemisphere, also stable. The cerebellum is otherwise unchanged. A lacunar infarct is seen within the pons, stable. The brainstem is otherwise normal. Basal cisterns are patent. Ventricles and sulci are prominent but stable. No mass effect or midline shift. No acute cortical ischemia is identified. Vascular: Calcified atherosclerosis is seen in the  intracranial carotids. Skull: Normal. Negative for fracture or focal lesion. Sinuses/Orbits: No acute finding. Other: None. IMPRESSION: Chronic white matter changes and infarcts as above. No acute intracranial abnormalities identified. Electronically Signed   By: Dorise Bullion III M.D   On: 05/10/2019 13:52   Ct Chest Wo Contrast  Result Date: 05/23/2019 CLINICAL DATA:  Non-small-cell lung cancer. Restaging. EXAM: CT CHEST WITHOUT CONTRAST  TECHNIQUE: Multidetector CT imaging of the chest was performed following the standard protocol without IV contrast. COMPARISON:  05/27/2018 FINDINGS: Cardiovascular: The heart size is normal. No substantial pericardial effusion. Coronary artery calcification is evident. Atherosclerotic calcification is noted in the wall of the thoracic aorta. Mediastinum/Nodes: No mediastinal lymphadenopathy. No evidence for gross hilar lymphadenopathy although assessment is limited by the lack of intravenous contrast on today's study. The esophagus has normal imaging features. There is no axillary lymphadenopathy. Lungs/Pleura: Centrilobular and paraseptal emphysema evident. Patient is status post right lower lobe superior segmentectomy. 4 mm right middle lobe nodule (87/7) is unchanged. Subtle 6 mm ground-glass opacity in the left lower lobe (108/7) is unchanged in the interval. No focal airspace consolidation. No pleural effusion. Upper Abdomen: Calcified gallstones again noted. Diverticular changes are seen in the abdominal segments of the colon. There is abdominal aortic atherosclerosis. Musculoskeletal: No worrisome lytic or sclerotic osseous abnormality. IMPRESSION: 1. Stable exam. No new or progressive findings to suggest recurrent or metastatic disease in the chest. 2. Emphysema. 3. Coronary artery and thoracoabdominal aortic atherosclerosis. 4. Cholelithiasis. Aortic Atherosclerosis (ICD10-I70.0) and Emphysema (ICD10-J43.9). Electronically Signed   By: Misty Stanley M.D.   On:  05/23/2019 13:10   Ct Renal Stone Study  Result Date: 05/10/2019 CLINICAL DATA:  Weakness for 2 days.  Fall.  Hematuria. EXAM: CT ABDOMEN AND PELVIS WITHOUT CONTRAST TECHNIQUE: Multidetector CT imaging of the abdomen and pelvis was performed following the standard protocol without IV contrast. COMPARISON:  Multiple CT scans since January 17, 2015 FINDINGS: Lower chest: No acute abnormality. Hepatobiliary: The liver is unremarkable. Cholelithiasis is identified without wall thickening. Pancreas: Unremarkable. No pancreatic ductal dilatation or surrounding inflammatory changes. Spleen: Normal in size without focal abnormality. Adrenals/Urinary Tract: A 2 mm stone is seen in the left kidney on coronal image 66. Bilateral renal cysts are noted. There is an unusual contour to the inferior left kidney. This was described as a complex cyst on a previous study from June of 2016. This lesion demonstrates an attenuation of 31 Hounsfield units today but is much smaller when compared to June of 2016 suggesting a benign etiology. Mild hydronephrosis and ureterectasis on the right. The distal right ureter is obscured by streak artifact off hip replacements. The left ureter is normal in caliber. No hydronephrosis on the left. No perinephric stranding adjacent to either kidney. Visualized portions of the bladder are normal. Stomach/Bowel: The stomach and small bowel are normal. A portion of the sigmoid colon is obscured by streak artifact off the hip replacements. Colonic diverticulosis is identified, particularly in the descending and sigmoid colon. The remainder of the colon is normal. No evidence of appendicitis. Vascular/Lymphatic: Dense atherosclerotic changes are seen in the nonaneurysmal aorta, iliac vessels, and femoral vessels. No adenopathy. Reproductive: Previous prostatectomy. Prostate bed is obscured by streak artifact. Other: No abdominal wall hernia or abnormality. No abdominopelvic ascites. Musculoskeletal:  Bilateral hip replacements are identified. Bones are stable since April 01, 2019. IMPRESSION: 1. There is mild right hydronephrosis and ureterectasis. The distal most right ureter is obscured by streak artifact off hip replacements. The findings could represent either a stone in the distal right ureter or a recently passed stone as the finding is new when compared April 01, 2019. 2. There is an unusual contour to the inferior left kidney. A complex lesion was seen in this region on previous studies. While the finding is nonspecific on on today's study, the fact that the complex lesion seen in 2016 is significantly smaller today  suggests a benign etiology. 3. 2 mm stone in the left kidney. 4. Colonic diverticulosis without diverticulitis. Atherosclerotic changes in the nonaneurysmal aorta and branching vessels. Electronically Signed   By: Dorise Bullion III M.D   On: 05/10/2019 17:19    ASSESSMENT AND PLAN:  This is a very pleasant 78  years old white male with history of stage IA non-small cell lung cancer, adenocarcinoma status post right lower lobectomy with lymph node dissection in August 2016 and has been observation since that time. The patient is doing fine today with no concerning complaints. He had repeat CT scan of the chest performed recently.  I personally and independently reviewed the scans and discussed the results with the patient today. Has a scan showed no concerning findings for disease progression. I will see him back for follow-up visit in 1 year for evaluation with repeat CT scan of the chest. Regarding the bedbugs, we strongly recommended for the wife to have some professional people spray her house. The patient was advised to call immediately if he has any concerning symptoms in the interval. The patient voices understanding of current disease status and treatment options and is in agreement with the current care plan.  All questions were answered. The patient knows to call the  clinic with any problems, questions or concerns. We can certainly see the patient much sooner if necessary. I spent 10 minutes counseling the patient face to face. The total time spent in the appointment was 15 minutes.  Disclaimer: This note was dictated with voice recognition software. Similar sounding words can inadvertently be transcribed and may not be corrected upon review.

## 2019-05-28 NOTE — Telephone Encounter (Signed)
Scheduled appt per 10/14 los - mailed reminder letter with appt date and time

## 2019-05-29 ENCOUNTER — Encounter: Payer: Self-pay | Admitting: Internal Medicine

## 2019-05-29 ENCOUNTER — Ambulatory Visit (INDEPENDENT_AMBULATORY_CARE_PROVIDER_SITE_OTHER): Payer: Medicare Other | Admitting: Internal Medicine

## 2019-05-29 ENCOUNTER — Other Ambulatory Visit: Payer: Self-pay

## 2019-05-29 VITALS — BP 118/76 | HR 55 | Temp 98.2°F | Ht 69.0 in | Wt 128.0 lb

## 2019-05-29 DIAGNOSIS — N1831 Chronic kidney disease, stage 3a: Secondary | ICD-10-CM

## 2019-05-29 DIAGNOSIS — E611 Iron deficiency: Secondary | ICD-10-CM

## 2019-05-29 DIAGNOSIS — F0390 Unspecified dementia without behavioral disturbance: Secondary | ICD-10-CM

## 2019-05-29 DIAGNOSIS — R7302 Impaired glucose tolerance (oral): Secondary | ICD-10-CM | POA: Diagnosis not present

## 2019-05-29 DIAGNOSIS — Z0000 Encounter for general adult medical examination without abnormal findings: Secondary | ICD-10-CM

## 2019-05-29 DIAGNOSIS — Z23 Encounter for immunization: Secondary | ICD-10-CM

## 2019-05-29 DIAGNOSIS — I10 Essential (primary) hypertension: Secondary | ICD-10-CM

## 2019-05-29 DIAGNOSIS — E559 Vitamin D deficiency, unspecified: Secondary | ICD-10-CM

## 2019-05-29 DIAGNOSIS — E538 Deficiency of other specified B group vitamins: Secondary | ICD-10-CM | POA: Diagnosis not present

## 2019-05-29 LAB — POCT GLYCOSYLATED HEMOGLOBIN (HGB A1C): Hemoglobin A1C: 5 % (ref 4.0–5.6)

## 2019-05-29 MED ORDER — CYANOCOBALAMIN 1000 MCG/ML IJ SOLN
1000.0000 ug | Freq: Once | INTRAMUSCULAR | Status: AC
  Administered 2019-05-29: 12:00:00 1000 ug via INTRAMUSCULAR

## 2019-05-29 MED ORDER — LOVASTATIN 40 MG PO TABS: 40.0000 mg | ORAL_TABLET | Freq: Every day | ORAL | 3 refills | Status: DC

## 2019-05-29 MED ORDER — VITAMIN B-12 1000 MCG PO TABS: 1000.0000 ug | ORAL_TABLET | Freq: Every day | ORAL | 3 refills | Status: DC

## 2019-05-29 NOTE — Patient Instructions (Addendum)
You had the B12 shot today  Ok to change to the B12 pills   Your A1c was OK today  Please continue all other medications as before, and refills have been done if requested.  Please have the pharmacy call with any other refills you may need.  Please continue your efforts at being more active, low cholesterol diet, and weight control.  Please keep your appointments with your specialists as you may have planned  Please return in 6 months, or sooner if needed, with Lab testing done 3-5 days before

## 2019-05-29 NOTE — Progress Notes (Addendum)
Subjective:    Patient ID: Victor Davis, male    DOB: Jun 25, 1941, 78 y.o.   MRN: [id number]  HPI  Here to f/u; overall doing ok,  Pt denies chest pain, increasing sob or doe, wheezing, orthopnea, PND, increased LE swelling, palpitations, dizziness or syncope.  Pt denies new neurological symptoms such as new headache, or facial or extremity weakness or numbness.  Pt denies polydipsia, polyuria  Pt states overall good compliance with meds, mostly trying to follow appropriate diet, appetite better, gained wt to 128.  Has been walking up to 1 mile per day to the grocery and back.  Due for b12 replacement.  No overt bleeding.  Dementia overall stable symptomatically and not assoc with behavioral changes such as hallucinations, paranoia, or agitation.  No other new complaints  Sees multiple providers including audiiology soon, Dr Earlie Server, dr Koleen Nimrod, neurology, dr sanford and Dr Tresa Moore Past Medical History:  Diagnosis Date  . Anemia, unspecified   . Cancer ([place])    lung and prostate cancer per wife  . Carotid artery disease ([place])   . CKD (chronic kidney disease), stage IV ([place])   . Coronary artery disease    a. DES to LAD and Cx 02/2012.  [name] Dementia ([place])   . Dysuria 10/10/2010  . Emphysema 04/01/2012   By CT chest, august 2013  . ERECTILE DYSFUNCTION 04/05/2007  . GERD (gastroesophageal reflux disease)   . GI bleeding   . HYPERLIPIDEMIA 07/30/2008  . HYPERSOMNIA 10/02/2007  . Impaired glucose tolerance 02/09/2011  . INSOMNIA-SLEEP DISORDER-UNSPEC 10/22/2009  . LOW BACK PAIN 04/05/2007  . OSTEOARTHRITIS 04/05/2007  . PEPTIC ULCER DISEASE 04/05/2007  . PERIPHERAL EDEMA 10/22/2009  . PROSTATE CANCER, HX OF 04/05/2007  . RESTLESS LEG SYNDROME 05/15/2007  . RHINITIS, ALLERGIC NOS 05/15/2007  . Sinus bradycardia 10/24/2010   a. prior h/o HR 40s, clonidine stopped 05/2017 due to this.  . Stroke (cerebrum) ([place])   . Unspecified visual loss 07/15/2008  . UTI 10/24/2010   Past Surgical History:   Procedure Laterality Date  . CARDIAC CATHETERIZATION    . COLONOSCOPY N/A 11/28/2017   Procedure: COLONOSCOPY;  Surgeon: Yetta Flock, MD;  [facility];  Service: Gastroenterology;  Laterality: N/A;  . ESOPHAGOGASTRODUODENOSCOPY N/A 11/28/2017   Procedure: ESOPHAGOGASTRODUODENOSCOPY (EGD);  Surgeon: Yetta Flock, MD;  Location: [facility];  Service: Gastroenterology;  Laterality: N/A;  . EYE SURGERY     right  . Inguinal herniorrhapy left  2002   x 2  . Left hip replacement    . LYMPH NODE DISSECTION Right 03/22/2015   Procedure: LYMPH NODE DISSECTION;  Surgeon: Melrose Nakayama, MD;  Location: [place];  Service: Thoracic;  Laterality: Right;  . PERCUTANEOUS CORONARY STENT INTERVENTION (PCI-S) N/A 03/08/2012   Procedure: PERCUTANEOUS CORONARY STENT INTERVENTION (PCI-S);  Surgeon: Burnell Blanks, MD;  [facility] LAB;  Service: Cardiovascular;  Laterality: N/A;  . PROSTATECTOMY  2002   radical  . Right hip replacement  09/22/09  . ROTATOR CUFF REPAIR     left  . s/p ventral surgery  2009  . SEGMENTECOMY Right 03/22/2015   Procedure: RIGHT LOWER LOBE SUPERIOR SEGMENTECTOMY;  Surgeon: Melrose Nakayama, MD;  Location: [place];  Service: Thoracic;  Laterality: Right;  [name] VIDEO ASSISTED THORACOSCOPY Right 03/22/2015   Procedure: RIGHT VIDEO ASSISTED THORACOSCOPY;  Surgeon: Melrose Nakayama, MD;  Location: [place];  Service: Thoracic;  Laterality: Right;    reports that he quit smoking about 42 years  ago. His smoking use included cigarettes. He has a 15.00 pack-year smoking history. He has never used smokeless tobacco. He reports that he does not drink alcohol or use drugs. family history includes Heart attack (age of onset: 45) in his father; Heart attack (age of onset: 64) in his brother; Lung cancer in his sister. Allergies  Allergen Reactions  . Amitriptyline Hcl Other (See Comments)    REACTION: confusion  . Diphenhydramine Hcl Other (See Comments)     Keeps him awake  . Simvastatin Other (See Comments)    REACTION: leg pain  . Clonidine Derivatives     Dizziness, falls, bradycardia  . Tylenol [Acetaminophen] Other (See Comments)    Dr advised pt not to take due to finding a spot on pt's kidney   No current facility-administered medications on file prior to visit.    Current Outpatient Medications on File Prior to Visit  Medication Sig Dispense Refill  . amLODipine (NORVASC) 5 MG tablet Take 1 tablet (5 mg total) by mouth daily. 90 tablet 3  . Ascorbic Acid (VITAMIN C) 500 MG CAPS Take 500 mg by mouth daily.     [name] aspirin 81 MG chewable tablet Chew 1 tablet (81 mg total) by mouth daily. Start from 4/22 30 tablet 0  . azelastine (OPTIVAR) 0.05 % ophthalmic solution Place 1 drop into both eyes 2 (two) times daily. 6 mL 12  . citalopram (CELEXA) 20 MG tablet Take 1 tablet (20 mg total) by mouth daily. 90 tablet 1  . donepezil (ARICEPT) 5 MG tablet TAKE 1 TABLET BY MOUTH EVERYDAY AT BEDTIME (Patient taking differently: Take 5 mg by mouth at bedtime. ) 90 tablet 0  . ferrous sulfate 325 (65 FE) MG tablet Take 325 mg by mouth daily.   1  . gabapentin (NEURONTIN) 300 MG capsule TAKE 1 CAPSULE BY MOUTH TWICE A DAY (Patient taking differently: Take 300 mg by mouth 2 (two) times daily. ) 180 capsule 0  . hydrALAZINE (APRESOLINE) 25 MG tablet TAKE 1 TABLET (25 MG TOTAL) BY MOUTH EVERY 8 (EIGHT) HOURS. (Patient taking differently: Take 25 mg by mouth 3 (three) times daily. ) 270 tablet 1  . isosorbide mononitrate (IMDUR) 30 MG 24 hr tablet Take 1 tablet (30 mg total) by mouth daily. 90 tablet 1  . meclizine (ANTIVERT) 25 MG tablet Take 0.5-1 tablets (12.5-25 mg total) by mouth as needed for dizziness. TAKE 1/2 - 1 TABLET BY MOUTH THREE TIMES A DAY AS NEEDED FOR DIZINESS (Patient taking differently: Take 12.5-25 mg by mouth 3 (three) times daily as needed for dizziness. ) 30 tablet 2  . nitroGLYCERIN (NITROSTAT) 0.4 MG SL tablet Place 1 tablet (0.4 mg  total) under the tongue every 5 (five) minutes as needed for chest pain. 25 tablet 3  . pantoprazole (PROTONIX) 40 MG tablet TAKE 1 TABLET BY MOUTH EVERY DAY (Patient taking differently: Take 40 mg by mouth daily. ) 90 tablet 1  . pramipexole (MIRAPEX) 0.5 MG tablet Take 1 tablet (0.5 mg total) by mouth 3 (three) times daily. (Patient not taking: Reported on 05/31/2019) 270 tablet 3  . predniSONE (DELTASONE) 5 MG tablet Take 5 mg by mouth at bedtime.     . thiamine (VITAMIN B-1) 50 MG tablet Take 1 tablet (50 mg total) by mouth daily. (Patient taking differently: Take 100 mg by mouth daily. ) 30 tablet 0   Review of Systems  Constitutional: Negative for other unusual diaphoresis or sweats HENT: Negative for ear discharge or  swelling Eyes: Negative for other worsening visual disturbances Respiratory: Negative for stridor or other swelling  Gastrointestinal: Negative for worsening distension or other blood Genitourinary: Negative for retention or other urinary change Musculoskeletal: Negative for other MSK pain or swelling Skin: Negative for color change or other new lesions Neurological: Negative for worsening tremors and other numbness  Psychiatric/Behavioral: Negative for worsening agitation or other fatigue All otherwise neg per pt    Objective:   Physical Exam BP 118/76   Pulse (!) 55   Temp 98.2 F (36.8 C) (Oral)   Ht 5\' 9"  (1.753 m)   Wt 128 lb (58.1 kg)   SpO2 97%   BMI 18.90 kg/m  VS noted,  Constitutional: Pt appears in NAD HENT: Head: NCAT.  Right Ear: External ear normal.  Left Ear: External ear normal.  Eyes: . Pupils are equal, round, and reactive to light. Conjunctivae and EOM are normal Nose: without d/c or deformity Neck: Neck supple. Gross normal ROM Cardiovascular: Normal rate and regular rhythm.   Pulmonary/Chest: Effort normal and breath sounds without rales or wheezing.  Abd:  Soft, NT, ND, + BS, no organomegaly Neurological: Pt is alert. At baseline  orientation, motor grossly intact Skin: Skin is warm. No rashes, other new lesions, no LE edema Psychiatric: Pt behavior is normal without agitation  All otherwise neg per pt  POCT glycosylated hemoglobin (Hb A1C) Order: [id number] Status:  Final result Visible to patient:  No (not released) Dx:  Impaired glucose tolerance  Ref Range & Units 11:52 (05/29/19) 37mo ago (12/24/18) 90yr ago (04/02/18) 11yr ago (11/26/17) 63yr ago (04/11/17) 63yr ago (04/06/16) 66yr ago (04/21/14)  Hemoglobin A1C 4.0 - 5.6 % 5.0  5.2 R, CM  5.6 R, CM  5.5 R, CM  5.6 R, CM  5.5 R, CM  5.8 R,           Assessment & Plan:

## 2019-05-31 ENCOUNTER — Emergency Department (HOSPITAL_COMMUNITY): Payer: Medicare Other

## 2019-05-31 ENCOUNTER — Encounter (HOSPITAL_COMMUNITY): Payer: Self-pay | Admitting: Emergency Medicine

## 2019-05-31 ENCOUNTER — Other Ambulatory Visit: Payer: Self-pay

## 2019-05-31 ENCOUNTER — Inpatient Hospital Stay (HOSPITAL_COMMUNITY)
Admission: EM | Admit: 2019-05-31 | Discharge: 2019-06-08 | DRG: 699 | Disposition: A | Discharge status: Home Health | Payer: Medicare Other | Attending: Internal Medicine | Admitting: Internal Medicine

## 2019-05-31 DIAGNOSIS — R310 Gross hematuria: Secondary | ICD-10-CM | POA: Diagnosis present

## 2019-05-31 DIAGNOSIS — F0390 Unspecified dementia without behavioral disturbance: Secondary | ICD-10-CM

## 2019-05-31 DIAGNOSIS — K219 Gastro-esophageal reflux disease without esophagitis: Secondary | ICD-10-CM | POA: Diagnosis not present

## 2019-05-31 DIAGNOSIS — I129 Hypertensive chronic kidney disease with stage 1 through stage 4 chronic kidney disease, or unspecified chronic kidney disease: Secondary | ICD-10-CM | POA: Diagnosis present

## 2019-05-31 DIAGNOSIS — Z955 Presence of coronary angioplasty implant and graft: Secondary | ICD-10-CM

## 2019-05-31 DIAGNOSIS — I10 Essential (primary) hypertension: Secondary | ICD-10-CM | POA: Diagnosis not present

## 2019-05-31 DIAGNOSIS — G629 Polyneuropathy, unspecified: Secondary | ICD-10-CM | POA: Diagnosis present

## 2019-05-31 DIAGNOSIS — Z801 Family history of malignant neoplasm of trachea, bronchus and lung: Secondary | ICD-10-CM | POA: Diagnosis not present

## 2019-05-31 DIAGNOSIS — N179 Acute kidney failure, unspecified: Secondary | ICD-10-CM | POA: Diagnosis present

## 2019-05-31 DIAGNOSIS — Z8673 Personal history of transient ischemic attack (TIA), and cerebral infarction without residual deficits: Secondary | ICD-10-CM | POA: Diagnosis not present

## 2019-05-31 DIAGNOSIS — F0150 Vascular dementia without behavioral disturbance: Secondary | ICD-10-CM | POA: Diagnosis not present

## 2019-05-31 DIAGNOSIS — Z9181 History of falling: Secondary | ICD-10-CM | POA: Diagnosis not present

## 2019-05-31 DIAGNOSIS — N3041 Irradiation cystitis with hematuria: Secondary | ICD-10-CM | POA: Diagnosis not present

## 2019-05-31 DIAGNOSIS — E785 Hyperlipidemia, unspecified: Secondary | ICD-10-CM | POA: Diagnosis present

## 2019-05-31 DIAGNOSIS — I2510 Atherosclerotic heart disease of native coronary artery without angina pectoris: Secondary | ICD-10-CM | POA: Diagnosis not present

## 2019-05-31 DIAGNOSIS — Z8711 Personal history of peptic ulcer disease: Secondary | ICD-10-CM

## 2019-05-31 DIAGNOSIS — Z8546 Personal history of malignant neoplasm of prostate: Secondary | ICD-10-CM

## 2019-05-31 DIAGNOSIS — N184 Chronic kidney disease, stage 4 (severe): Secondary | ICD-10-CM | POA: Diagnosis not present

## 2019-05-31 DIAGNOSIS — D631 Anemia in chronic kidney disease: Secondary | ICD-10-CM | POA: Diagnosis not present

## 2019-05-31 DIAGNOSIS — Y842 Radiological procedure and radiotherapy as the cause of abnormal reaction of the patient, or of later complication, without mention of misadventure at the time of the procedure: Secondary | ICD-10-CM | POA: Diagnosis present

## 2019-05-31 DIAGNOSIS — R5381 Other malaise: Secondary | ICD-10-CM | POA: Diagnosis not present

## 2019-05-31 DIAGNOSIS — N3289 Other specified disorders of bladder: Secondary | ICD-10-CM | POA: Diagnosis not present

## 2019-05-31 DIAGNOSIS — D62 Acute posthemorrhagic anemia: Secondary | ICD-10-CM | POA: Diagnosis present

## 2019-05-31 DIAGNOSIS — Z923 Personal history of irradiation: Secondary | ICD-10-CM | POA: Diagnosis not present

## 2019-05-31 DIAGNOSIS — Z20828 Contact with and (suspected) exposure to other viral communicable diseases: Secondary | ICD-10-CM | POA: Diagnosis present

## 2019-05-31 DIAGNOSIS — Z7982 Long term (current) use of aspirin: Secondary | ICD-10-CM

## 2019-05-31 DIAGNOSIS — R319 Hematuria, unspecified: Secondary | ICD-10-CM | POA: Diagnosis not present

## 2019-05-31 DIAGNOSIS — Z8249 Family history of ischemic heart disease and other diseases of the circulatory system: Secondary | ICD-10-CM

## 2019-05-31 DIAGNOSIS — Z87891 Personal history of nicotine dependence: Secondary | ICD-10-CM | POA: Diagnosis not present

## 2019-05-31 DIAGNOSIS — G8929 Other chronic pain: Secondary | ICD-10-CM | POA: Diagnosis present

## 2019-05-31 DIAGNOSIS — N390 Urinary tract infection, site not specified: Secondary | ICD-10-CM | POA: Diagnosis not present

## 2019-05-31 DIAGNOSIS — Z7952 Long term (current) use of systemic steroids: Secondary | ICD-10-CM

## 2019-05-31 DIAGNOSIS — Z9079 Acquired absence of other genital organ(s): Secondary | ICD-10-CM

## 2019-05-31 DIAGNOSIS — N183 Chronic kidney disease, stage 3 unspecified: Secondary | ICD-10-CM | POA: Diagnosis present

## 2019-05-31 DIAGNOSIS — R309 Painful micturition, unspecified: Secondary | ICD-10-CM | POA: Diagnosis not present

## 2019-05-31 DIAGNOSIS — C61 Malignant neoplasm of prostate: Secondary | ICD-10-CM | POA: Diagnosis present

## 2019-05-31 DIAGNOSIS — Z96643 Presence of artificial hip joint, bilateral: Secondary | ICD-10-CM | POA: Diagnosis present

## 2019-05-31 LAB — BASIC METABOLIC PANEL
Anion gap: 8 (ref 5–15)
BUN: 45 mg/dL — ABNORMAL HIGH (ref 8–23)
CO2: 22 mmol/L (ref 22–32)
Calcium: 8.9 mg/dL (ref 8.9–10.3)
Chloride: 109 mmol/L (ref 98–111)
Creatinine, Ser: 2.46 mg/dL — ABNORMAL HIGH (ref 0.61–1.24)
GFR calc Af Amer: 28 mL/min — ABNORMAL LOW (ref >60)
GFR calc non Af Amer: 24 mL/min — ABNORMAL LOW (ref >60)
Glucose, Bld: 134 mg/dL — ABNORMAL HIGH (ref 70–99)
Potassium: 4.6 mmol/L (ref 3.5–5.1)
Sodium: 139 mmol/L (ref 135–145)

## 2019-05-31 LAB — CBC
HCT: 22.6 % — ABNORMAL LOW (ref 39.0–52.0)
Hemoglobin: 6.7 g/dL — CL (ref 13.0–17.0)
MCH: 30.5 pg (ref 26.0–34.0)
MCHC: 29.6 g/dL — ABNORMAL LOW (ref 30.0–36.0)
MCV: 102.7 fL — ABNORMAL HIGH (ref 80.0–100.0)
Platelets: 153 10*3/uL (ref 150–400)
RBC: 2.2 MIL/uL — ABNORMAL LOW (ref 4.22–5.81)
RDW: 13.4 % (ref 11.5–15.5)
WBC: 8.1 10*3/uL (ref 4.0–10.5)
nRBC: 0 % (ref 0.0–0.2)

## 2019-05-31 LAB — URINALYSIS, ROUTINE W REFLEX MICROSCOPIC
Bacteria, UA: NONE SEEN
Bilirubin Urine: NEGATIVE
Glucose, UA: 50 mg/dL — AB
Ketones, ur: NEGATIVE mg/dL
Leukocytes,Ua: NEGATIVE
Nitrite: NEGATIVE
Protein, ur: >=300 mg/dL — AB
RBC / HPF: >50 RBC/hpf — ABNORMAL HIGH (ref 0–5)
Specific Gravity, Urine: 1.018 (ref 1.005–1.030)
WBC, UA: >50 WBC/hpf — ABNORMAL HIGH (ref 0–5)
pH: 8 (ref 5.0–8.0)

## 2019-05-31 LAB — SARS CORONAVIRUS 2 (TAT 6-24 HRS): SARS Coronavirus 2: NEGATIVE

## 2019-05-31 LAB — HEPATIC FUNCTION PANEL
ALT: 8 U/L (ref 0–44)
AST: 14 U/L — ABNORMAL LOW (ref 15–41)
Albumin: 3 g/dL — ABNORMAL LOW (ref 3.5–5.0)
Alkaline Phosphatase: 63 U/L (ref 38–126)
Bilirubin, Direct: 0.2 mg/dL (ref 0.0–0.2)
Indirect Bilirubin: 0.9 mg/dL (ref 0.3–0.9)
Total Bilirubin: 1.1 mg/dL (ref 0.3–1.2)
Total Protein: 5.7 g/dL — ABNORMAL LOW (ref 6.5–8.1)

## 2019-05-31 LAB — PREPARE RBC (CROSSMATCH)

## 2019-05-31 LAB — AMMONIA: Ammonia: 15 umol/L (ref 9–35)

## 2019-05-31 MED ORDER — SODIUM CHLORIDE 0.9 % IR SOLN
3000.0000 mL | Status: DC
  Administered 2019-05-31 – 2019-06-03 (×7): 3000 mL

## 2019-05-31 MED ORDER — VITAMIN C 500 MG PO TABS
500.0000 mg | ORAL_TABLET | Freq: Every day | ORAL | Status: DC
  Administered 2019-06-01 – 2019-06-08 (×9): 500 mg via ORAL
  Filled 2019-05-31 (×9): qty 1

## 2019-05-31 MED ORDER — PREDNISONE 5 MG PO TABS
5.0000 mg | ORAL_TABLET | Freq: Every day | ORAL | Status: DC
  Administered 2019-06-01 – 2019-06-07 (×8): 5 mg via ORAL
  Filled 2019-05-31 (×8): qty 1

## 2019-05-31 MED ORDER — FERROUS SULFATE 325 (65 FE) MG PO TABS
325.0000 mg | ORAL_TABLET | Freq: Every day | ORAL | Status: DC
  Administered 2019-06-01 – 2019-06-08 (×8): 325 mg via ORAL
  Filled 2019-05-31 (×8): qty 1

## 2019-05-31 MED ORDER — CITALOPRAM HYDROBROMIDE 20 MG PO TABS
20.0000 mg | ORAL_TABLET | Freq: Every day | ORAL | Status: DC
  Administered 2019-06-01 – 2019-06-08 (×8): 20 mg via ORAL
  Filled 2019-05-31 (×8): qty 1

## 2019-05-31 MED ORDER — MECLIZINE HCL 25 MG PO TABS: 12.5000 mg | ORAL_TABLET | Freq: Three times a day (TID) | ORAL | Status: DC | PRN

## 2019-05-31 MED ORDER — PRAVASTATIN SODIUM 40 MG PO TABS
40.0000 mg | ORAL_TABLET | Freq: Every day | ORAL | Status: DC
  Administered 2019-06-01 – 2019-06-07 (×7): 40 mg via ORAL
  Filled 2019-05-31 (×7): qty 1

## 2019-05-31 MED ORDER — SODIUM CHLORIDE 0.9% IV SOLUTION: Freq: Once | INTRAVENOUS | Status: DC

## 2019-05-31 MED ORDER — PANTOPRAZOLE SODIUM 40 MG PO TBEC
40.0000 mg | DELAYED_RELEASE_TABLET | Freq: Every day | ORAL | Status: DC
  Administered 2019-06-01 – 2019-06-08 (×8): 40 mg via ORAL
  Filled 2019-05-31 (×8): qty 1

## 2019-05-31 MED ORDER — SODIUM CHLORIDE 0.9 % IV SOLN
250.0000 mL | INTRAVENOUS | Status: DC | PRN
  Administered 2019-06-05: 250 mL via INTRAVENOUS

## 2019-05-31 MED ORDER — GABAPENTIN 300 MG PO CAPS
300.0000 mg | ORAL_CAPSULE | Freq: Two times a day (BID) | ORAL | Status: DC
  Administered 2019-06-01 – 2019-06-08 (×15): 300 mg via ORAL
  Filled 2019-05-31 (×16): qty 1

## 2019-05-31 MED ORDER — VITAMIN B-1 100 MG PO TABS
100.0000 mg | ORAL_TABLET | Freq: Every day | ORAL | Status: DC
  Administered 2019-06-01 – 2019-06-08 (×9): 100 mg via ORAL
  Filled 2019-05-31 (×9): qty 1

## 2019-05-31 MED ORDER — SODIUM CHLORIDE 0.9% FLUSH: 3.0000 mL | INTRAVENOUS | Status: DC | PRN

## 2019-05-31 MED ORDER — DONEPEZIL HCL 5 MG PO TABS
5.0000 mg | ORAL_TABLET | Freq: Every day | ORAL | Status: DC
  Administered 2019-06-01 – 2019-06-07 (×8): 5 mg via ORAL
  Filled 2019-05-31 (×8): qty 1

## 2019-05-31 MED ORDER — AMLODIPINE BESYLATE 5 MG PO TABS
5.0000 mg | ORAL_TABLET | Freq: Every day | ORAL | Status: DC
  Administered 2019-06-01 – 2019-06-08 (×8): 5 mg via ORAL
  Filled 2019-05-31 (×8): qty 1

## 2019-05-31 MED ORDER — ONDANSETRON HCL 4 MG/2ML IJ SOLN
4.0000 mg | Freq: Four times a day (QID) | INTRAMUSCULAR | Status: DC | PRN
  Administered 2019-05-31 – 2019-06-06 (×2): 4 mg via INTRAVENOUS
  Filled 2019-05-31 (×2): qty 2

## 2019-05-31 MED ORDER — ISOSORBIDE MONONITRATE ER 30 MG PO TB24
30.0000 mg | ORAL_TABLET | Freq: Every day | ORAL | Status: DC
  Administered 2019-06-01 – 2019-06-08 (×8): 30 mg via ORAL
  Filled 2019-05-31 (×8): qty 1

## 2019-05-31 MED ORDER — HYDRALAZINE HCL 25 MG PO TABS
25.0000 mg | ORAL_TABLET | Freq: Three times a day (TID) | ORAL | Status: DC
  Administered 2019-06-01 – 2019-06-08 (×22): 25 mg via ORAL
  Filled 2019-05-31 (×22): qty 1

## 2019-05-31 MED ORDER — SODIUM CHLORIDE 0.9% FLUSH
3.0000 mL | Freq: Two times a day (BID) | INTRAVENOUS | Status: DC
  Administered 2019-06-02 – 2019-06-08 (×11): 3 mL via INTRAVENOUS

## 2019-05-31 MED ORDER — KETOTIFEN FUMARATE 0.025 % OP SOLN
1.0000 [drp] | Freq: Two times a day (BID) | OPHTHALMIC | Status: DC
  Administered 2019-06-01 – 2019-06-08 (×16): 1 [drp] via OPHTHALMIC
  Filled 2019-05-31: qty 5

## 2019-05-31 MED ORDER — CHLORHEXIDINE GLUCONATE CLOTH 2 % EX PADS
6.0000 | MEDICATED_PAD | Freq: Every day | CUTANEOUS | Status: DC
  Administered 2019-06-01 – 2019-06-08 (×7): 6 via TOPICAL

## 2019-05-31 MED ORDER — SODIUM CHLORIDE 0.9 % IV SOLN
1.0000 g | INTRAVENOUS | Status: DC
  Administered 2019-05-31 – 2019-06-06 (×7): 1 g via INTRAVENOUS
  Filled 2019-05-31 (×7): qty 1

## 2019-05-31 MED ORDER — VITAMIN D3 25 MCG (1000 UNIT) PO TABS
2000.0000 [IU] | ORAL_TABLET | Freq: Every day | ORAL | Status: DC
  Administered 2019-06-01 – 2019-06-08 (×8): 2000 [IU] via ORAL
  Filled 2019-05-31 (×8): qty 2

## 2019-05-31 MED ORDER — VITAMIN B-12 1000 MCG PO TABS
1000.0000 ug | ORAL_TABLET | Freq: Every day | ORAL | Status: DC
  Administered 2019-06-01 – 2019-06-08 (×8): 1000 ug via ORAL
  Filled 2019-05-31 (×8): qty 1

## 2019-05-31 MED ORDER — ABIRATERONE ACETATE 250 MG PO TABS: 1000.0000 mg | ORAL_TABLET | Freq: Every day | ORAL | Status: DC

## 2019-05-31 MED ORDER — SODIUM CHLORIDE 0.9 % IV SOLN
INTRAVENOUS | Status: AC
  Administered 2019-05-31: 21:00:00 via INTRAVENOUS

## 2019-05-31 NOTE — Consult Note (Addendum)
Consultation: Gross hematuria  requested by: Dr. Dorie Rank  History of Present Illness:  Victor Davis is a 78 year old white male who follows with Dr. 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  He developed gross hematuria this morning, malaise and painful inability to void.  He was brought to [facility] where a 3 way catheter placed and CBI instituted.  He has required hand irrigation.  Ultrasound of bladder revealed clots around the Foley catheter.  He is receiving blood now. Creatinine at baseline.    His past medical history is significant for radical prostatectomy for prostate cancer in 2002 followed by salvage radiation therapy in 2003.  Since that time he has had PSA recurrence and a rising PSA despite treatment with Lupron and Zytiga.  Most recent PSA and office up to 52 despite a testosterone less than 10.  Last staging with CT scan May 10, 2019 which was negative for metastatic disease.  He has some Bosniak 47F cysts.  Past Medical History:  Diagnosis Date  . Anemia, unspecified   . Cancer ([place])    lung and prostate cancer per wife  . Carotid artery disease ([place])   . CKD (chronic kidney disease), stage IV ([place])   . Coronary artery disease    a. DES to LAD and Cx 02/2012.  [name] Dementia ([place])   . Dysuria 10/10/2010  . Emphysema 04/01/2012   By CT chest, august 2013  . ERECTILE DYSFUNCTION 04/05/2007  . GERD (gastroesophageal reflux disease)   . GI bleeding   . HYPERLIPIDEMIA 07/30/2008  . HYPERSOMNIA 10/02/2007  . Impaired glucose tolerance 02/09/2011  . INSOMNIA-SLEEP DISORDER-UNSPEC 10/22/2009  . LOW BACK PAIN 04/05/2007  . OSTEOARTHRITIS 04/05/2007  . PEPTIC ULCER DISEASE 04/05/2007  . PERIPHERAL EDEMA 10/22/2009  . PROSTATE CANCER, HX OF 04/05/2007  . RESTLESS LEG SYNDROME 05/15/2007  . RHINITIS, ALLERGIC NOS 05/15/2007  . Sinus bradycardia 10/24/2010   a. prior h/o HR 40s, clonidine stopped 05/2017 due to this.  . Stroke (cerebrum) ([place])   . Unspecified visual  loss 07/15/2008  . UTI 10/24/2010   Past Surgical History:  Procedure Laterality Date  . CARDIAC CATHETERIZATION    . COLONOSCOPY N/A 11/28/2017   Procedure: COLONOSCOPY;  Surgeon: Yetta Flock, MD;  [facility];  Service: Gastroenterology;  Laterality: N/A;  . ESOPHAGOGASTRODUODENOSCOPY N/A 11/28/2017   Procedure: ESOPHAGOGASTRODUODENOSCOPY (EGD);  Surgeon: Yetta Flock, MD;  Location: [facility];  Service: Gastroenterology;  Laterality: N/A;  . EYE SURGERY     right  . Inguinal herniorrhapy left  2002   x 2  . Left hip replacement    . LYMPH NODE DISSECTION Right 03/22/2015   Procedure: LYMPH NODE DISSECTION;  Surgeon: Melrose Nakayama, MD;  Location: [place];  Service: Thoracic;  Laterality: Right;  . PERCUTANEOUS CORONARY STENT INTERVENTION (PCI-S) N/A 03/08/2012   Procedure: PERCUTANEOUS CORONARY STENT INTERVENTION (PCI-S);  Surgeon: Burnell Blanks, MD;  [facility] CATH LAB;  Service: Cardiovascular;  Laterality: N/A;  . PROSTATECTOMY  2002   radical  . Right hip replacement  09/22/09  . ROTATOR CUFF REPAIR     left  . s/p ventral surgery  2009  . SEGMENTECOMY Right 03/22/2015   Procedure: RIGHT LOWER LOBE SUPERIOR SEGMENTECTOMY;  Surgeon: Melrose Nakayama, MD;  Location: [place];  Service: Thoracic;  Laterality: Right;  [name] VIDEO ASSISTED THORACOSCOPY Right 03/22/2015   Procedure: RIGHT VIDEO ASSISTED THORACOSCOPY;  Surgeon: Melrose Nakayama, MD;  Location: [place];  Service:  Thoracic;  Laterality: Right;    Home Medications:  Medications Prior to Admission  Medication Sig Dispense Refill Last Dose  . abiraterone acetate (ZYTIGA) 250 MG tablet Take 1,000 mg by mouth at bedtime.   05/30/2019 at Unknown time  . amLODipine (NORVASC) 5 MG tablet Take 1 tablet (5 mg total) by mouth daily. 90 tablet 3 05/31/2019 at Unknown time  . Ascorbic Acid (VITAMIN C) 500 MG CAPS Take 500 mg by mouth daily.    05/31/2019 at Unknown time  . aspirin 81 MG chewable  tablet Chew 1 tablet (81 mg total) by mouth daily. Start from 4/22 30 tablet 0 05/31/2019 at Unknown time  . azelastine (OPTIVAR) 0.05 % ophthalmic solution Place 1 drop into both eyes 2 (two) times daily. 6 mL 12 05/31/2019 at Unknown time  . citalopram (CELEXA) 20 MG tablet Take 1 tablet (20 mg total) by mouth daily. 90 tablet 1 05/31/2019 at Unknown time  . donepezil (ARICEPT) 5 MG tablet TAKE 1 TABLET BY MOUTH EVERYDAY AT BEDTIME (Patient taking differently: Take 5 mg by mouth at bedtime. ) 90 tablet 0 05/30/2019 at Unknown time  . ferrous sulfate 325 (65 FE) MG tablet Take 325 mg by mouth daily.   1 05/31/2019 at Unknown time  . gabapentin (NEURONTIN) 300 MG capsule TAKE 1 CAPSULE BY MOUTH TWICE A DAY (Patient taking differently: Take 300 mg by mouth 2 (two) times daily. ) 180 capsule 0 05/31/2019 at Unknown time  . hydrALAZINE (APRESOLINE) 25 MG tablet TAKE 1 TABLET (25 MG TOTAL) BY MOUTH EVERY 8 (EIGHT) HOURS. (Patient taking differently: Take 25 mg by mouth 3 (three) times daily. ) 270 tablet 1 05/31/2019 at Unknown time  . isosorbide mononitrate (IMDUR) 30 MG 24 hr tablet Take 1 tablet (30 mg total) by mouth daily. 90 tablet 1 05/31/2019 at Unknown time  . lovastatin (MEVACOR) 40 MG tablet Take 1 tablet (40 mg total) by mouth at bedtime. 90 tablet 3 05/30/2019 at Unknown time  . meclizine (ANTIVERT) 25 MG tablet Take 0.5-1 tablets (12.5-25 mg total) by mouth as needed for dizziness. TAKE 1/2 - 1 TABLET BY MOUTH THREE TIMES A DAY AS NEEDED FOR DIZINESS (Patient taking differently: Take 12.5-25 mg by mouth 3 (three) times daily as needed for dizziness. ) 30 tablet 2 Past Month at Unknown time  . nitroGLYCERIN (NITROSTAT) 0.4 MG SL tablet Place 1 tablet (0.4 mg total) under the tongue every 5 (five) minutes as needed for chest pain. 25 tablet 3   . pantoprazole (PROTONIX) 40 MG tablet TAKE 1 TABLET BY MOUTH EVERY DAY (Patient taking differently: Take 40 mg by mouth daily. ) 90 tablet 1 05/31/2019 at  Unknown time  . predniSONE (DELTASONE) 5 MG tablet Take 5 mg by mouth at bedtime.    05/30/2019 at Unknown time  . vitamin B-12 (CYANOCOBALAMIN) 1000 MCG tablet Take 1 tablet (1,000 mcg total) by mouth daily. 90 tablet 3 05/31/2019 at Unknown time  . Vitamin D, Cholecalciferol, 50 MCG (2000 UT) CAPS Take 50 mcg by mouth daily.   05/31/2019 at Unknown time  . pramipexole (MIRAPEX) 0.5 MG tablet Take 1 tablet (0.5 mg total) by mouth 3 (three) times daily. (Patient not taking: Reported on 05/31/2019) 270 tablet 3 Not Taking at Unknown time  . thiamine (VITAMIN B-1) 50 MG tablet Take 1 tablet (50 mg total) by mouth daily. (Patient taking differently: Take 100 mg by mouth daily. ) 30 tablet 0    Allergies:  Allergies  Allergen  Reactions  . Amitriptyline Hcl Other (See Comments)    REACTION: confusion  . Diphenhydramine Hcl Other (See Comments)    Keeps him awake  . Simvastatin Other (See Comments)    REACTION: leg pain  . Clonidine Derivatives     Dizziness, falls, bradycardia  . Tylenol [Acetaminophen] Other (See Comments)    Dr advised pt not to take due to finding a spot on pt's kidney    Family History  Problem Relation Age of Onset  . Heart attack Father 30       Smoker  . Heart attack Brother 53  . Lung cancer Sister   . Colon cancer Neg Hx    Social History:  reports that he quit smoking about 42 years ago. His smoking use included cigarettes. He has a 15.00 pack-year smoking history. He has never used smokeless tobacco. He reports that he does not drink alcohol or use drugs.  ROS: A complete review of systems was performed.  All systems are negative except for pertinent findings as noted. Review of Systems  Constitutional: Positive for malaise/fatigue.  All other systems reviewed and are negative.    Physical Exam:  Vital signs in last 24 hours: Temp:  [97.7 F (36.5 C)-97.9 F (36.6 C)] 97.7 F (36.5 C) (10/17 1744) Pulse Rate:  [54-68] 57 (10/17 1744) Resp:   [13-19] 18 (10/17 1744) BP: (99-159)/(49-63) 159/62 (10/17 1744) SpO2:  [98 %-100 %] 100 % (10/17 1744)   He is somnolent but easily arousable.  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   Abdomen -soft and nontender, no rebound or guarding, no distention  GU -3 way Foley catheter in place with bloody urine and clots, slow CBI drip   procedure:  I irrigated the Foley catheter with about 2 L of normal saline.  He was irrigated to clear and was between light red and light pink on a moderate CBI drip.  Laboratory Data:  Results for orders placed or performed during the hospital encounter of 05/31/19 (from the past 24 hour(s))  Urinalysis, Routine w reflex microscopic- may I&O cath if menses     Status: Abnormal   Collection Time: 05/31/19 10:50 AM  Result Value Ref Range   Color, Urine STRAW (A) YELLOW   APPearance CLOUDY (A) CLEAR   Specific Gravity, Urine 1.018 1.005 - 1.030   pH 8.0 5.0 - 8.0   Glucose, UA 50 (A) NEGATIVE mg/dL   Hgb urine dipstick LARGE (A) NEGATIVE   Bilirubin Urine NEGATIVE NEGATIVE   Ketones, ur NEGATIVE NEGATIVE mg/dL   Protein, ur >=300 (A) NEGATIVE mg/dL   Nitrite NEGATIVE NEGATIVE   Leukocytes,Ua NEGATIVE NEGATIVE   RBC / HPF >50 (H) 0 - 5 RBC/hpf   WBC, UA >50 (H) 0 - 5 WBC/hpf   Bacteria, UA NONE SEEN NONE SEEN   WBC Clumps PRESENT   Type and screen [place]     Status: None (Preliminary result)   Collection Time: 05/31/19 11:16 AM  Result Value Ref Range   ABO/RH(D) A NEG    Antibody Screen NEG    Sample Expiration 06/03/2019,2359    Unit Number [id number]    Blood Component Type RED CELLS,LR    Unit division 00    Status of Unit ISSUED    Transfusion Status OK TO TRANSFUSE    Crossmatch Result      Compatible Performed at [facility], [place] [address]  [place], [place] [address]   CBC     Status: Abnormal   Collection Time: 05/31/19  1:02 PM  Result Value Ref Range    WBC 8.1 4.0 - 10.5 K/uL   RBC 2.20 (L) 4.22 - 5.81 MIL/uL   Hemoglobin 6.7 (LL) 13.0 - 17.0 g/dL   HCT 22.6 (L) 39.0 - 52.0 %   MCV 102.7 (H) 80.0 - 100.0 fL   MCH 30.5 26.0 - 34.0 pg   MCHC 29.6 (L) 30.0 - 36.0 g/dL   RDW 13.4 11.5 - 15.5 %   Platelets 153 150 - 400 K/uL   nRBC 0.0 0.0 - 0.2 %  Basic metabolic panel     Status: Abnormal   Collection Time: 05/31/19  1:02 PM  Result Value Ref Range   Sodium 139 135 - 145 mmol/L   Potassium 4.6 3.5 - 5.1 mmol/L   Chloride 109 98 - 111 mmol/L   CO2 22 22 - 32 mmol/L   Glucose, Bld 134 (H) 70 - 99 mg/dL   BUN 45 (H) 8 - 23 mg/dL   Creatinine, Ser 2.46 (H) 0.61 - 1.24 mg/dL   Calcium 8.9 8.9 - 10.3 mg/dL   GFR calc non Af Amer 24 (L) >60 mL/min   GFR calc Af Amer 28 (L) >60 mL/min   Anion gap 8 5 - 15  Hepatic function panel     Status: Abnormal   Collection Time: 05/31/19  1:07 PM  Result Value Ref Range   Total Protein 5.7 (L) 6.5 - 8.1 g/dL   Albumin 3.0 (L) 3.5 - 5.0 g/dL   AST 14 (L) 15 - 41 U/L   ALT 8 0 - 44 U/L   Alkaline Phosphatase 63 38 - 126 U/L   Total Bilirubin 1.1 0.3 - 1.2 mg/dL   Bilirubin, Direct 0.2 0.0 - 0.2 mg/dL   Indirect Bilirubin 0.9 0.3 - 0.9 mg/dL  Ammonia     Status: None   Collection Time: 05/31/19  1:30 PM  Result Value Ref Range   Ammonia 15 9 - 35 umol/L  Prepare RBC     Status: None   Collection Time: 05/31/19  2:30 PM  Result Value Ref Range   Order Confirmation      ORDER PROCESSED BY BLOOD BANK Performed at [facility], [place] [address], [place], [place] [address]    No results found for this or any previous visit (from the past 240 hour(s)). Creatinine: Recent Labs    05/31/19 1302  CREATININE 2.46*    Impression/Assessment  Kathyrn Lass:    Gross hematuria- likely culprit is hemorrhagic cystitis  From retention or infection or radiation cystitis, possibly prostate cancer recurrence.  Recent CT without upper tract mass  Or any obvious recurrence in the pelvis or  metastatic disease.  Continue CBI and resuscitation.           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  Dr. Tammi Klippel is considering other options.  As above, no recent evidence of local or metastatic recurrence.    Complex renal cyst -stable on recent imaging         Victor Davis 05/31/2019, 8:14 PM

## 2019-05-31 NOTE — ED Triage Notes (Addendum)
Patient here from home reporting blood in urine x2 days. Pelvic pain and painful urination. Hx of prostate cancer.

## 2019-05-31 NOTE — Progress Notes (Signed)
Rx Brief note: Zytiga    Per [place] policy oral chemo is held when any of the following occurs:  Abiraterone (Zytiga) Hold Criteria:  ALT or AST > 5x ULN  TBili > 3x ULN  Active infection  Note that patient should be continued on prednisone if was receiving prednisone as outpatient.  Patient is being treated with rocephin for presumed infection.   Thanks Lawana Pai R] 05/31/2019 10:13 PM

## 2019-05-31 NOTE — ED Notes (Signed)
His wife is with him. Between assessments he is lightly napping.

## 2019-05-31 NOTE — H&P (Addendum)
TRH H&P    Patient Demographics:    Victor Davis, is a 79 y.o. male  MRN: [id number]  DOB - 1940-09-19  Admit Date - 05/31/2019  Referring MD/NP/PA:  Marye Round  Outpatient Primary MD for the patient is Biagio Borg, MD  Patient coming from:  home  Chief complaint- gross hematuria,    HPI:    Victor Davis  is a 78 y.o. male, w hypertension, hyperlipidemia, glucose intolerance, ckd stage 4,  Cad s/p DES to LAD, Lcx 02/2012, h/o carotid stenosis, h/o stroke, dementia, h/o prostate cancer, s/p prostatectomy, w hx of hematuria about 2 weeks ago tx with abx, and then started to notice gross hematuria this AM.  Pt therefore presented to ED for evaluation.  Pt notes slight dysuria.  Denies fever, chills, n/v, flank pain.  Pt was previously on plavix but stopped about 4 weeks ago. Pt continues to take aspirin 84m po qday  In Ed,  T 97.7  P 56, R 13, Bp 148/62  Pox 98% on RA  Wbc 8.1, Hgb 6.7, Plt 153 Bun 45, Creatinne 2.46 Alb 3.0 Ast 14, Alt 8, Alk phos 63, T. Bili 1.1  Urinalysis rbc >50, wbc >50, prot >300  Pt will be admitted for acute lower uti, and gross hematuria.     Review of systems:    In addition to the HPI above,  No Fever-chills, No Headache, No changes with Vision or hearing, No problems swallowing food or Liquids, No Chest pain, Cough or Shortness of Breath, No Abdominal pain, No Nausea or Vomiting, bowel movements are regular, No Blood in stool     No new skin rashes or bruises, No new joints pains-aches,  No new weakness, tingling, numbness in any extremity, No recent weight gain or loss, No polyuria, polydypsia or polyphagia, No significant Mental Stressors.  All other systems reviewed and are negative.    Past History of the following :    Past Medical History:  Diagnosis Date  . Anemia, unspecified   . [place] ([place]    lung and prostate cancer per wife  . Carotid  artery disease ([facility]   . CKD (chronic kidney disease), stage IV ([facility]   . Coronary artery disease    a. DES to LAD and Cx 02/2012.  .Marland KitchenDementia ([facility]   . Dysuria 10/10/2010  . Emphysema 04/01/2012   By CT chest, august 2013  . ERECTILE DYSFUNCTION 04/05/2007  . GERD (gastroesophageal reflux disease)   . GI bleeding   . HYPERLIPIDEMIA 07/30/2008  . HYPERSOMNIA 10/02/2007  . Impaired glucose tolerance 02/09/2011  . INSOMNIA-SLEEP DISORDER-UNSPEC 10/22/2009  . LOW BACK PAIN 04/05/2007  . OSTEOARTHRITIS 04/05/2007  . PEPTIC ULCER DISEASE 04/05/2007  . PERIPHERAL EDEMA 10/22/2009  . PROSTATE CANCER, HX OF 04/05/2007  . RESTLESS LEG SYNDROME 05/15/2007  . RHINITIS, ALLERGIC NOS 05/15/2007  . Sinus bradycardia 10/24/2010   a. prior h/o HR 40s, clonidine stopped 05/2017 due to this.  . Stroke (cerebrum) ([facility]   . Unspecified visual loss 07/15/2008  . UTI 10/24/2010  Past Surgical History:  Procedure Laterality Date  . CARDIAC CATHETERIZATION    . COLONOSCOPY N/A 11/28/2017   Procedure: COLONOSCOPY;  Surgeon: Yetta Flock, MD;  Location[facility];  Service: Gastroenterology;  Laterality: N/A;  . ESOPHAGOGASTRODUODENOSCOPY N/A 11/28/2017   Procedure: ESOPHAGOGASTRODUODENOSCOPY (EGD);  Surgeon: Yetta Flock, MD;  Location: [facility];  Service: Gastroenterology;  Laterality: N/A;  . EYE SURGERY     right  . Inguinal herniorrhapy left  2002   x 2  . Left hip replacement    . LYMPH NODE DISSECTION Right 03/22/2015   Procedure: LYMPH NODE DISSECTION;  Surgeon: Melrose Nakayama, MD;  Location: [facility];  Service: Thoracic;  Laterality: Right;  . PERCUTANEOUS CORONARY STENT INTERVENTION (PCI-S) N/A 03/08/2012   Procedure: PERCUTANEOUS CORONARY STENT INTERVENTION (PCI-S);  Surgeon: Burnell Blanks, MD;  Location: [facility] CATH LAB;  Service: Cardiovascular;  Laterality: N/A;  . PROSTATECTOMY  2002   radical  . Right hip replacement  09/22/09  . ROTATOR CUFF REPAIR     left  . s/p  ventral surgery  2009  . SEGMENTECOMY Right 03/22/2015   Procedure: RIGHT LOWER LOBE SUPERIOR SEGMENTECTOMY;  Surgeon: Melrose Nakayama, MD;  Location: [facility];  Service: Thoracic;  Laterality: Right;  [name] VIDEO ASSISTED THORACOSCOPY Right 03/22/2015   Procedure: RIGHT VIDEO ASSISTED THORACOSCOPY;  Surgeon: Melrose Nakayama, MD;  Location: [facility];  Service: Thoracic;  Laterality: Right;      Social History:      Social History   Tobacco Use  . Smoking status: Former Smoker    Packs/day: 1.00    Years: 15.00    Pack years: 15.00    Types: Cigarettes    Quit date: 02/05/1977    Years since quitting: 42.3  . Smokeless tobacco: Never Used  Substance Use Topics  . Alcohol use: No    Alcohol/week: 1.0 standard drinks    Types: 1 Standard drinks or equivalent per week    Comment: RARE       Family History :     Family History  Problem Relation Age of Onset  . Heart attack Father 79       Smoker  . Heart attack Brother 42  . Lung cancer Sister   . Colon cancer Neg Hx        Home Medications:   Prior to Admission medications   Medication Sig Start Date End Date Taking? Authorizing Provider  abiraterone acetate (ZYTIGA) 250 MG tablet Take 1,000 mg by mouth at bedtime. 05/12/19  Yes [provider]  amLODipine (NORVASC) 5 MG tablet Take 1 tablet (5 mg total) by mouth daily. 02/24/19 02/24/20 Yes Biagio Borg, MD  Ascorbic Acid (VITAMIN C) 500 MG CAPS Take 500 mg by mouth daily.    Yes [provider]  aspirin 81 MG chewable tablet Chew 1 tablet (81 mg total) by mouth daily. Start from 4/22 12/03/17  Yes Florencia Reasons, MD  azelastine (OPTIVAR) 0.05 % ophthalmic solution Place 1 drop into both eyes 2 (two) times daily. 02/24/19  Yes Biagio Borg, MD  citalopram (CELEXA) 20 MG tablet Take 1 tablet (20 mg total) by mouth daily. 01/01/19  Yes Biagio Borg, MD  donepezil (ARICEPT) 5 MG tablet TAKE 1 TABLET BY MOUTH EVERYDAY AT BEDTIME Patient taking differently: Take 5  mg by mouth at bedtime.  03/24/19  Yes Biagio Borg, MD  ferrous sulfate 325 (65 FE) MG tablet Take 325 mg by mouth daily.  04/04/18  Yes [provider]  gabapentin (NEURONTIN) 300 MG capsule TAKE 1 CAPSULE BY MOUTH TWICE A DAY Patient taking differently: Take 300 mg by mouth 2 (two) times daily.  03/12/19  Yes Biagio Borg, MD  hydrALAZINE (APRESOLINE) 25 MG tablet TAKE 1 TABLET (25 MG TOTAL) BY MOUTH EVERY 8 (EIGHT) HOURS. Patient taking differently: Take 25 mg by mouth 3 (three) times daily.  05/27/19  Yes Biagio Borg, MD  isosorbide mononitrate (IMDUR) 30 MG 24 hr tablet Take 1 tablet (30 mg total) by mouth daily. 03/11/19  Yes Biagio Borg, MD  lovastatin (MEVACOR) 40 MG tablet Take 1 tablet (40 mg total) by mouth at bedtime. 05/29/19  Yes Biagio Borg, MD  meclizine (ANTIVERT) 25 MG tablet Take 0.5-1 tablets (12.5-25 mg total) by mouth as needed for dizziness. TAKE 1/2 - 1 TABLET BY MOUTH THREE TIMES A DAY AS NEEDED FOR DIZINESS Patient taking differently: Take 12.5-25 mg by mouth 3 (three) times daily as needed for dizziness.  02/24/19  Yes Biagio Borg, MD  nitroGLYCERIN (NITROSTAT) 0.4 MG SL tablet Place 1 tablet (0.4 mg total) under the tongue every 5 (five) minutes as needed for chest pain. 07/29/18  Yes Burnell Blanks, MD  pantoprazole (PROTONIX) 40 MG tablet TAKE 1 TABLET BY MOUTH EVERY DAY Patient taking differently: Take 40 mg by mouth daily.  01/01/19  Yes Biagio Borg, MD  predniSONE (DELTASONE) 5 MG tablet Take 5 mg by mouth at bedtime.    Yes [provider]  vitamin B-12 (CYANOCOBALAMIN) 1000 MCG tablet Take 1 tablet (1,000 mcg total) by mouth daily. 05/29/19  Yes Biagio Borg, MD  Vitamin D, Cholecalciferol, 50 MCG (2000 UT) CAPS Take 50 mcg by mouth daily.   Yes [provider]  pramipexole (MIRAPEX) 0.5 MG tablet Take 1 tablet (0.5 mg total) by mouth 3 (three) times daily. Patient not taking: Reported on 05/31/2019 07/10/18   Biagio Borg, MD  thiamine (VITAMIN B-1) 50 MG tablet Take 1 tablet (50 mg total) by mouth daily. Patient taking differently: Take 100 mg by mouth daily.  11/28/17   Florencia Reasons, MD     Allergies:     Allergies  Allergen Reactions  . Amitriptyline Hcl Other (See Comments)    REACTION: confusion  . Diphenhydramine Hcl Other (See Comments)    Keeps him awake  . Simvastatin Other (See Comments)    REACTION: leg pain  . Clonidine Derivatives     Dizziness, falls, bradycardia  . Tylenol [Acetaminophen] Other (See Comments)    Dr advised pt not to take due to finding a spot on pt's kidney     Physical Exam:   Vitals  Blood pressure 125/61, pulse (!) 57, temperature 97.7 F (36.5 C), resp. rate 18, SpO2 100 %.  1.  General: axoxo3  2. Psychiatric: euthymic  3. Neurologic: cn2-12 intact, reflexes 2+ symmetric, diffuse with no clonus, motor 5/5 in all 4 ext  4. HEENMT:  Anicteric, pupils 1.41m symmetric, direct, consensual, near intact Neck: no jvd  5. Respiratory : CTAB  6. Cardiovascular : rrr s1, s2,   7. Gastrointestinal:  Abd: soft, nt, nd, +bs  8. Skin:  Ext: no c/c/e,  No rash  9.Musculoskeletal:  Good ROM    Data Review:    CBC Recent Labs  Lab 05/31/19 1302  WBC 8.1  HGB 6.7*  HCT 22.6*  PLT 153  MCV 102.7*  MCH 30.5  MCHC 29.6*  RDW 13.4   ------------------------------------------------------------------------------------------------------------------  Results for orders placed or performed during the hospital encounter of 05/31/19 (from the past 48 hour(s))  Urinalysis, Routine w reflex microscopic- may I&O cath if menses     Status: Abnormal   Collection Time: 05/31/19 10:50 AM  Result Value Ref Range   Color, Urine STRAW (A) YELLOW   APPearance CLOUDY (A) CLEAR   Specific Gravity, Urine 1.018 1.005 - 1.030   pH 8.0 5.0 - 8.0   Glucose, UA 50 (A) NEGATIVE mg/dL   Hgb urine dipstick LARGE (A) NEGATIVE   Bilirubin Urine NEGATIVE NEGATIVE    Ketones, ur NEGATIVE NEGATIVE mg/dL   Protein, ur >=300 (A) NEGATIVE mg/dL   Nitrite NEGATIVE NEGATIVE   Leukocytes,Ua NEGATIVE NEGATIVE   RBC / HPF >50 (H) 0 - 5 RBC/hpf   WBC, UA >50 (H) 0 - 5 WBC/hpf   Bacteria, UA NONE SEEN NONE SEEN   WBC Clumps PRESENT     Comment: Performed at [facility], [place] [address] [place], [place] [address]  Type and screen [place]     Status: None (Preliminary result)   Collection Time: 05/31/19 11:16 AM  Result Value Ref Range   ABO/RH(D) A NEG    Antibody Screen NEG    Sample Expiration      06/03/2019,2359 Performed at [facility], [place] [address] [place], [place] [address]    Unit Number [id number]    Blood Component Type RED CELLS,LR    Unit division 00    Status of Unit ALLOCATED    Transfusion Status OK TO TRANSFUSE    Crossmatch Result Compatible   CBC     Status: Abnormal   Collection Time: 05/31/19  1:02 PM  Result Value Ref Range   WBC 8.1 4.0 - 10.5 K/uL   RBC 2.20 (L) 4.22 - 5.81 MIL/uL   Hemoglobin 6.7 (LL) 13.0 - 17.0 g/dL    Comment: This critical result has verified and been called to C FRANKLIN,RN by JACQUELINE HOLMES on 10 17 2020 at 1344, and has been read back. CRITICAL REUSLTS VERIFIED   HCT 22.6 (L) 39.0 - 52.0 %   MCV 102.7 (H) 80.0 - 100.0 fL   MCH 30.5 26.0 - 34.0 pg   MCHC 29.6 (L) 30.0 - 36.0 g/dL   RDW 13.4 11.5 - 15.5 %   Platelets 153 150 - 400 K/uL   nRBC 0.0 0.0 - 0.2 %    Comment: Performed at [facility], [place] [address] [place], [place] [address]  Basic metabolic panel     Status: Abnormal   Collection Time: 05/31/19  1:02 PM  Result Value Ref Range   Sodium 139 135 - 145 mmol/L   Potassium 4.6 3.5 - 5.1 mmol/L   Chloride 109 98 - 111 mmol/L   CO2 22 22 - 32 mmol/L   Glucose, Bld 134 (H) 70 - 99 mg/dL   BUN 45 (H) 8 - 23 mg/dL   Creatinine, Ser 2.46 (H) 0.61 - 1.24 mg/dL   Calcium 8.9 8.9 - 10.3 mg/dL   GFR calc  non Af Amer 24 (L) >60 mL/min   GFR calc Af Amer 28 (L) >60 mL/min   Anion gap 8 5 - 15    Comment: Performed at [facility], [place] [address], [place], [place] [address]  Hepatic function panel     Status: Abnormal   Collection Time: 05/31/19  1:07 PM  Result  Value Ref Range   Total Protein 5.7 (L) 6.5 - 8.1 g/dL   Albumin 3.0 (L) 3.5 - 5.0 g/dL   AST 14 (L) 15 - 41 U/L   ALT 8 0 - 44 U/L   Alkaline Phosphatase 63 38 - 126 U/L   Total Bilirubin 1.1 0.3 - 1.2 mg/dL   Bilirubin, Direct 0.2 0.0 - 0.2 mg/dL   Indirect Bilirubin 0.9 0.3 - 0.9 mg/dL    Comment: Performed at [facility], [place] [address] [place], [place] [address]  Ammonia     Status: None   Collection Time: 05/31/19  1:30 PM  Result Value Ref Range   Ammonia 15 9 - 35 umol/L    Comment: Performed at [facility], [place] [address] [place], [place] [address]  Prepare RBC     Status: None   Collection Time: 05/31/19  2:30 PM  Result Value Ref Range   Order Confirmation      ORDER PROCESSED BY BLOOD BANK Performed at [facility] [address] [place], [place] [address]     Chemistries  Recent Labs  Lab 05/31/19 1302 05/31/19 1307  NA 139  --   K 4.6  --   CL 109  --   CO2 22  --   GLUCOSE 134*  --   BUN 45*  --   CREATININE 2.46*  --   CALCIUM 8.9  --   AST  --  14*  ALT  --  8  ALKPHOS  --  63  BILITOT  --  1.1   ------------------------------------------------------------------------------------------------------------------  ------------------------------------------------------------------------------------------------------------------ GFR: Estimated Creatinine Clearance: 20 mL/min (A) (by C-G formula based on SCr of 2.46 mg/dL (H)). Liver Function Tests: Recent Labs  Lab 05/31/19 1307  AST 14*  ALT 8  ALKPHOS 63  BILITOT 1.1  PROT 5.7*  ALBUMIN 3.0*   No results for input(s): LIPASE, AMYLASE in the last 168  hours. Recent Labs  Lab 05/31/19 1330  AMMONIA 15   Coagulation Profile: No results for input(s): INR, PROTIME in the last 168 hours. Cardiac Enzymes: No results for input(s): CKTOTAL, CKMB, CKMBINDEX, TROPONINI in the last 168 hours. BNP (last 3 results) No results for input(s): PROBNP in the last 8760 hours. HbA1C: Recent Labs    05/29/19 1152  HGBA1C 5.0   CBG: No results for input(s): GLUCAP in the last 168 hours. Lipid Profile: No results for input(s): CHOL, HDL, LDLCALC, TRIG, CHOLHDL, LDLDIRECT in the last 72 hours. Thyroid Function Tests: No results for input(s): TSH, T4TOTAL, FREET4, T3FREE, THYROIDAB in the last 72 hours. Anemia Panel: No results for input(s): VITAMINB12, FOLATE, FERRITIN, TIBC, IRON, RETICCTPCT in the last 72 hours.  --------------------------------------------------------------------------------------------------------------- Urine analysis:    Component Value Date/Time   COLORURINE STRAW (A) 05/31/2019 1050   APPEARANCEUR CLOUDY (A) 05/31/2019 1050   LABSPEC 1.018 05/31/2019 1050   PHURINE 8.0 05/31/2019 1050   GLUCOSEU 50 (A) 05/31/2019 1050   GLUCOSEU NEGATIVE 12/25/2018 1043   HGBUR LARGE (A) 05/31/2019 [place] 05/31/2019 1050   KETONESUR NEGATIVE 05/31/2019 1050   PROTEINUR >=300 (A) 05/31/2019 1050   UROBILINOGEN 0.2 12/25/2018 1043   NITRITE NEGATIVE 05/31/2019 1050   LEUKOCYTESUR NEGATIVE 05/31/2019 1050      Imaging Results:    US Pelvis Limited (transabdominal Only)  Result Date: 05/31/2019 CLINICAL DATA:  Hematuria.  Scan for clots. EXAM: LIMITED ULTRASOUND OF PELVIS TECHNIQUE: Limited transabdominal ultrasound examination of the pelvis was performed. COMPARISON:  None. FINDINGS: The study was suboptimal as the patient began vomiting in the study was ended early. A Foley catheter is identified. The bladder wall appears markedly thickened. No anechoic urine is seen between the bladder wall and the catheter  balloon. There appears to be some heterogeneous material around the Foley catheter. IMPRESSION: The study is limited as it had to be ended early due to patient condition. There is marked bladder wall thickening. There is heterogeneous material around the Foley catheter which could represent blood products given history. A bladder mass cannot be confidently excluded on this study. Electronically Signed   By: Dorise Bullion III M.D   On: 05/31/2019 15:27      Assessment & Plan:    Active Problems:   PROSTATE CANCER, HX OF   CAD (coronary artery disease)   Hypertension   Acute lower UTI   Dementia (HCC)   CKD (chronic kidney disease), stage III (HCC)   Gross hematuria   Acute blood loss anemia  Gross hematuria  STOP aspirin temporarily Urology consulted by ED, appreciate input  Acute lower uti Urine culture Start Rocephin 1gm iv qday  Anemia Transfusion of 1 unit prbc ordered by ED Check cbc at 10pm after transfusion Check cbc in am  Hypertension Cont hydralazine 56m po tid Cont Amlodipine 554mpo qday  Hyperlipidemia Cont Lovastatin 4042mo qhs-> pravastatin 41m20m qhs  Chronic back pain/ neuropathy Cont Gabapentin  Gerd/ h/o esophagitis Cont PPI  Dementia Cont Aricept 5mg 38mqday Cont Celexa 20mg 42mday   H/o prostate cancer Cont Zytiga Cont Prednisone 5mg po28ms   DVT Prophylaxis-    SCDs   AM Labs Ordered, also please review Full Orders  Family Communication: Admission, patients condition and plan of care including tests being ordered have been discussed with the patient  who indicate understanding and agree with the plan and Code Status.  Code Status:  FULL CODE per wife,  Wife notified of admission to [facility]  [facility] Observation/: Based on patients clinical presentation and evaluation of above clinical data, I have made determination that patient meets observation criteria at this time.     Time spent in minutes : 70 minutes   Abdishakur Gottschall KJani Graveln 05/31/2019 at 5:09 PM

## 2019-05-31 NOTE — ED Provider Notes (Addendum)
[facility] DEPT Provider Note   CSN: [id number] Arrival date & time: 05/31/19  1017     History   Chief Complaint Chief Complaint  Patient presents with  . Dysuria  . Hematuria  . Pelvic Pain    HPI Victor Davis is a 78 y.o. male.     HPI Pt complains of hematuria that started yesterday.  Pt has been urinating blood but states he has been leaking blood from his penis.  He has been urinating frequently.  NO fevers.  No vomiting or diarrhea.   Pt does feel fatigued and sleepy.  Per the patient's wife patient has been in contact with his urologist.  They are aware that he is coming to the ED for this issue. Past Medical History:  Diagnosis Date  . Anemia, unspecified   . Cancer ([place])    lung and prostate cancer per wife  . Carotid artery disease ([place])   . CKD (chronic kidney disease), stage IV ([place])   . Coronary artery disease    a. DES to LAD and Cx 02/2012.  [name] Dementia ([place])   . Dysuria 10/10/2010  . Emphysema 04/01/2012   By CT chest, august 2013  . ERECTILE DYSFUNCTION 04/05/2007  . GERD (gastroesophageal reflux disease)   . GI bleeding   . HYPERLIPIDEMIA 07/30/2008  . HYPERSOMNIA 10/02/2007  . Impaired glucose tolerance 02/09/2011  . INSOMNIA-SLEEP DISORDER-UNSPEC 10/22/2009  . LOW BACK PAIN 04/05/2007  . OSTEOARTHRITIS 04/05/2007  . PEPTIC ULCER DISEASE 04/05/2007  . PERIPHERAL EDEMA 10/22/2009  . PROSTATE CANCER, HX OF 04/05/2007  . RESTLESS LEG SYNDROME 05/15/2007  . RHINITIS, ALLERGIC NOS 05/15/2007  . Sinus bradycardia 10/24/2010   a. prior h/o HR 40s, clonidine stopped 05/2017 due to this.  . Stroke (cerebrum) ([place])   . Unspecified visual loss 07/15/2008  . UTI 10/24/2010    Patient Active Problem List   Diagnosis Date Noted  . Failure to thrive in adult 05/10/2019  . Allergic conjunctivitis 02/24/2019  . Gait disorder 12/24/2018  . Weight loss 12/24/2018  . Thrombocytopenia ([place]) 05/28/2018  . Arm bruise 04/01/2018  .  Carotid stenosis 01/28/2018  . Symptomatic anemia 11/26/2017  . Depression 06/20/2017  . Bradycardia/Falls 06/08/2017  . Head injury without skull fracture/S/p Fall with Lt Frontal Laceration 06/08/2017  . Sinus bradycardia 06/08/2017  . B12 deficiency 04/11/2017  . CKD (chronic kidney disease), stage III ([place]) 02/04/2016  . Snoring 01/21/2016  . Dementia ([place]) 10/08/2015  . Multiple bruises 10/08/2015  . Non-small cell carcinoma of right lung, stage 1 ([place]) 04/29/2015  . Lung nodule 03/22/2015  . Cholecystostomy drain infection ([place]) 01/25/2015  . Abdominal pain 01/18/2015  . Cholecystitis 01/18/2015  . Acute on chronic kidney failure ([place]) 01/18/2015  . Right upper quadrant pain   . Acute cholecystitis   . Renal cyst 12/31/2012  . Hearing loss 07/05/2012  . Lesion of nose 07/05/2012  . Anemia, unspecified 04/02/2012  . Pulmonary emphysema ([place]) 04/01/2012  . Hypertension 03/09/2012  . Pulmonary nodule, right 02/08/2012  . Mitral regurgitation 02/06/2012  . CAD (coronary artery disease) 01/04/2012  . Unspecified disorder of liver 12/18/2011  . Hyperthyroidism 12/18/2011  . Nonspecific abnormal findings on radiological and examination of lung field 12/18/2011  . Impaired glucose tolerance 02/09/2011  . Encounter for well adult exam with abnormal findings 02/09/2011  . INSOMNIA-SLEEP DISORDER-UNSPEC 10/22/2009  . PERIPHERAL EDEMA 10/22/2009  . Hyperlipidemia 07/30/2008  . Peripheral vascular disease ([place]) 07/30/2008  . 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  Unspecified visual loss 07/15/2008  . SHOULDER PAIN, LEFT 01/22/2008  . Hypersomnia 10/02/2007  . RESTLESS LEG SYNDROME 05/15/2007  . RHINITIS, ALLERGIC NOS 05/15/2007  . ERECTILE DYSFUNCTION 04/05/2007  . PEPTIC ULCER DISEASE 04/05/2007  . Osteoarthritis 04/05/2007  . LOW BACK PAIN 04/05/2007  . PROSTATE CANCER, HX OF 04/05/2007    Past Surgical History:  Procedure Laterality Date  . CARDIAC CATHETERIZATION     . COLONOSCOPY N/A 11/28/2017   Procedure: COLONOSCOPY;  Surgeon: Yetta Flock, MD;  Location: [facility];  Service: Gastroenterology;  Laterality: N/A;  . ESOPHAGOGASTRODUODENOSCOPY N/A 11/28/2017   Procedure: ESOPHAGOGASTRODUODENOSCOPY (EGD);  Surgeon: Yetta Flock, MD;  Location: [facility];  Service: Gastroenterology;  Laterality: N/A;  . EYE SURGERY     right  . Inguinal herniorrhapy left  2002   x 2  . Left hip replacement    . LYMPH NODE DISSECTION Right 03/22/2015   Procedure: LYMPH NODE DISSECTION;  Surgeon: Melrose Nakayama, MD;  Location: [facility];  Service: Thoracic;  Laterality: Right;  . PERCUTANEOUS CORONARY STENT INTERVENTION (PCI-S) N/A 03/08/2012   Procedure: PERCUTANEOUS CORONARY STENT INTERVENTION (PCI-S);  Surgeon: Burnell Blanks, MD;  Location: [facility];  Service: Cardiovascular;  Laterality: N/A;  . PROSTATECTOMY  2002   radical  . Right hip replacement  09/22/09  . ROTATOR CUFF REPAIR     left  . s/p ventral surgery  2009  . SEGMENTECOMY Right 03/22/2015   Procedure: RIGHT LOWER LOBE SUPERIOR SEGMENTECTOMY;  Surgeon: Melrose Nakayama, MD;  Location: [facility];  Service: Thoracic;  Laterality: Right;  [name] VIDEO ASSISTED THORACOSCOPY Right 03/22/2015   Procedure: RIGHT VIDEO ASSISTED THORACOSCOPY;  Surgeon: Melrose Nakayama, MD;  Location: [facility];  Service: Thoracic;  Laterality: Right;        Home Medications    Prior to Admission medications   Medication Sig Start Date End Date Taking? Authorizing Provider  abiraterone acetate (ZYTIGA) 250 MG tablet Take 1,000 mg by mouth at bedtime. 05/12/19  Yes [provider]  amLODipine (NORVASC) 5 MG tablet Take 1 tablet (5 mg total) by mouth daily. 02/24/19 02/24/20 Yes Biagio Borg, MD  Ascorbic Acid (VITAMIN C) 500 MG CAPS Take 500 mg by mouth daily.    Yes [provider]  aspirin 81 MG chewable tablet Chew 1 tablet (81 mg total) by mouth daily. Start from 4/22 12/03/17  Yes  Florencia Reasons, MD  azelastine (OPTIVAR) 0.05 % ophthalmic solution Place 1 drop into both eyes 2 (two) times daily. 02/24/19  Yes Biagio Borg, MD  citalopram (CELEXA) 20 MG tablet Take 1 tablet (20 mg total) by mouth daily. 01/01/19  Yes Biagio Borg, MD  donepezil (ARICEPT) 5 MG tablet TAKE 1 TABLET BY MOUTH EVERYDAY AT BEDTIME Patient taking differently: Take 5 mg by mouth at bedtime.  03/24/19  Yes Biagio Borg, MD  ferrous sulfate 325 (65 FE) MG tablet Take 325 mg by mouth daily.  04/04/18  Yes [provider]  gabapentin (NEURONTIN) 300 MG capsule TAKE 1 CAPSULE BY MOUTH TWICE A DAY Patient taking differently: Take 300 mg by mouth 2 (two) times daily.  03/12/19  Yes Biagio Borg, MD  hydrALAZINE (APRESOLINE) 25 MG tablet TAKE 1 TABLET (25 MG TOTAL) BY MOUTH EVERY 8 (EIGHT) HOURS. Patient taking differently: Take 25 mg by mouth 3 (three) times daily.  05/27/19  Yes Biagio Borg, MD  isosorbide mononitrate (IMDUR) 30 MG 24 hr tablet Take 1  tablet (30 mg total) by mouth daily. 03/11/19  Yes Biagio Borg, MD  lovastatin (MEVACOR) 40 MG tablet Take 1 tablet (40 mg total) by mouth at bedtime. 05/29/19  Yes Biagio Borg, MD  meclizine (ANTIVERT) 25 MG tablet Take 0.5-1 tablets (12.5-25 mg total) by mouth as needed for dizziness. TAKE 1/2 - 1 TABLET BY MOUTH THREE TIMES A DAY AS NEEDED FOR DIZINESS Patient taking differently: Take 12.5-25 mg by mouth 3 (three) times daily as needed for dizziness.  02/24/19  Yes Biagio Borg, MD  nitroGLYCERIN (NITROSTAT) 0.4 MG SL tablet Place 1 tablet (0.4 mg total) under the tongue every 5 (five) minutes as needed for chest pain. 07/29/18  Yes Burnell Blanks, MD  pantoprazole (PROTONIX) 40 MG tablet TAKE 1 TABLET BY MOUTH EVERY DAY Patient taking differently: Take 40 mg by mouth daily.  01/01/19  Yes Biagio Borg, MD  predniSONE (DELTASONE) 5 MG tablet Take 5 mg by mouth at bedtime.    Yes [provider]  vitamin B-12 (CYANOCOBALAMIN) 1000  MCG tablet Take 1 tablet (1,000 mcg total) by mouth daily. 05/29/19  Yes Biagio Borg, MD  Vitamin D, Cholecalciferol, 50 MCG (2000 UT) CAPS Take 50 mcg by mouth daily.   Yes [provider]  pramipexole (MIRAPEX) 0.5 MG tablet Take 1 tablet (0.5 mg total) by mouth 3 (three) times daily. Patient not taking: Reported on 05/31/2019 07/10/18   Biagio Borg, MD  thiamine (VITAMIN B-1) 50 MG tablet Take 1 tablet (50 mg total) by mouth daily. Patient taking differently: Take 100 mg by mouth daily.  11/28/17   Florencia Reasons, MD    Family History Family History  Problem Relation Age of Onset  . Heart attack Father 72       Smoker  . Heart attack Brother 41  . Lung cancer Sister   . Colon cancer Neg Hx     Social History Social History   Tobacco Use  . Smoking status: Former Smoker    Packs/day: 1.00    Years: 15.00    Pack years: 15.00    Types: Cigarettes    Quit date: 02/05/1977    Years since quitting: 42.3  . Smokeless tobacco: Never Used  Substance Use Topics  . Alcohol use: No    Alcohol/week: 1.0 standard drinks    Types: 1 Standard drinks or equivalent per week    Comment: RARE  . Drug use: No     Allergies   Amitriptyline hcl, Diphenhydramine hcl, Simvastatin, Clonidine derivatives, and Tylenol [acetaminophen]   Review of Systems Review of Systems  Constitutional: Negative for fever.  Neurological: Positive for weakness ( generalized). Negative for numbness and headaches.  All other systems reviewed and are negative.    Physical Exam Updated Vital Signs BP 101/60   Pulse (!) 54   Temp 97.7 F (36.5 C)   Resp 13   SpO2 98%   Physical Exam Vitals signs and nursing note reviewed.  Constitutional:      Appearance: He is well-developed.     Comments: Elderly, frail; bedbugs noted on the patient's clothing at the bedside  HENT:     Head: Normocephalic and atraumatic.     Right Ear: External ear normal.     Left Ear: External ear normal.  Eyes:      General: No scleral icterus.       Right eye: No discharge.        Left eye: No discharge.  Conjunctiva/sclera: Conjunctivae normal.  Neck:     Musculoskeletal: Neck supple.     Trachea: No tracheal deviation.  Cardiovascular:     Rate and Rhythm: Normal rate and regular rhythm.  Pulmonary:     Effort: Pulmonary effort is normal. No respiratory distress.     Breath sounds: Normal breath sounds. No stridor. No wheezing or rales.  Abdominal:     General: Bowel sounds are normal. There is no distension.     Palpations: Abdomen is soft.     Tenderness: There is no abdominal tenderness. There is no guarding or rebound.  Genitourinary:    Comments: Patient has an adult diaper on, clots of blood noted in the diaper, blood noted at the urethral meatus Musculoskeletal:        General: No tenderness.  Skin:    General: Skin is warm and dry.     Findings: No rash.  Neurological:     Mental Status: He is alert.     Cranial Nerves: No cranial nerve deficit (no facial droop, extraocular movements intact, no slurred speech).     Sensory: No sensory deficit.     Motor: No abnormal muscle tone or seizure activity.     Coordination: Coordination normal.     Comments: Patient appears fatigued      ED Treatments / Results  Labs (all labs ordered are listed, but only abnormal results are displayed) Labs Reviewed  URINALYSIS, ROUTINE W REFLEX MICROSCOPIC - Abnormal; Notable for the following components:      Result Value   Color, Urine STRAW (*)    APPearance CLOUDY (*)    Glucose, UA 50 (*)    Hgb urine dipstick LARGE (*)    Protein, ur >=300 (*)    RBC / HPF >50 (*)    WBC, UA >50 (*)    All other components within normal limits  CBC - Abnormal; Notable for the following components:   RBC 2.20 (*)    Hemoglobin 6.7 (*)    HCT 22.6 (*)    MCV 102.7 (*)    MCHC 29.6 (*)    All other components within normal limits  BASIC METABOLIC PANEL - Abnormal; Notable for the following  components:   Glucose, Bld 134 (*)    BUN 45 (*)    Creatinine, Ser 2.46 (*)    GFR calc non Af Amer 24 (*)    GFR calc Af Amer 28 (*)    All other components within normal limits  URINE CULTURE  SARS CORONAVIRUS 2 (TAT 6-24 HRS)  AMMONIA  HEPATIC FUNCTION PANEL  TYPE AND SCREEN  PREPARE RBC (CROSSMATCH)    EKG None  Radiology No results found.  Procedures .Critical Care Performed by: Dorie Rank, MD Authorized by: Dorie Rank, MD   Critical care provider statement:    Critical care time (minutes):  45   Critical care was time spent personally by me on the following activities:  Discussions with consultants, evaluation of patient's response to treatment, examination of patient, ordering and performing treatments and interventions, ordering and review of laboratory studies, ordering and review of radiographic studies, pulse oximetry, re-evaluation of patient's condition, obtaining history from patient or surrogate and review of old charts   (including critical care time)  Medications Ordered in ED Medications  sodium chloride irrigation 0.9 % 3,000 mL (3,000 mLs Irrigation New Bag/Given 05/31/19 1300)  0.9 %  sodium chloride infusion (Manually program via Guardrails IV Fluids) (has no administration in time range)  Initial Impression / Assessment and Plan / ED Course  I have reviewed the triage vital signs and the nursing notes.  Pertinent labs & imaging results that were available during my care of the patient were reviewed by me and considered in my medical decision making (see chart for details).  Clinical Course as of May 30 1438  Sat May 31, 2019  1350 Hgb decreased at 6.7  , down from 8.0, 8 days ago   [JK]  1352 Patient with acute blood loss anemia associated with gross hematuria.  Blood pressure remained stable.  Blood transfusion was ordered.   [JK]  1427 Renal insufficiency is stable   [JK]  1438 Case discussed with Dr. Junious Silk.  Requests a pelvic  ultrasound to evaluate for clots within the bladder.  He will come to evaluate the patient in the ED.  Recommends medical admission   [JK]    Clinical Course User Index [JK] Dorie Rank, MD     Labs are notable for acute blood loss anemia associated with gross hematuria.  Patient underwent bladder irrigation but he continues to have bloody appearing urine with some clots.  I have ordered a blood transfusion for his anemia.  I will consult with urology the hospitalist service to arrange for admission and further treatment.  Final Clinical Impressions(s) / ED Diagnoses   Final diagnoses:  Gross hematuria  Acute blood loss anemia       Dorie Rank, MD 05/31/19 1429    Dorie Rank, MD 05/31/19 1439

## 2019-05-31 NOTE — Progress Notes (Signed)
Since pt admission to floor at 1630, pt has required hand irrigation x2. Multiple clots removed. Dark red output. Will continue to monitor patient.

## 2019-05-31 NOTE — ED Notes (Signed)
CRITICAL VALUE STICKER  CRITICAL VALUE: Hemoglobin 6.7  DATE & TIME NOTIFIED: 05/31/19 1343  MD NOTIFIED: Tomi Bamberger MD  TIME OF NOTIFICATION: 2979

## 2019-05-31 NOTE — ED Notes (Signed)
He has just had a small emesis. I have just given report to Ria Comment, RN on [place]. Will transport after giving Zofran.

## 2019-05-31 NOTE — ED Notes (Signed)
ED TO INPATIENT HANDOFF REPORT  Name/Age/Gender Victor Davis 78 y.o. male  Code Status Code Status History    Date Active Date Inactive Code Status Order ID Comments User Context   11/26/2017 1920 11/28/2017 2050 Full Code [id number]  Elodia Florence., MD Inpatient   06/08/2017 1019 06/10/2017 1544 Full Code [id number]  Roxan Hockey, MD ED   03/22/2015 1433 03/27/2015 1413 Full Code [id number]  John Giovanni, PA-C Inpatient   01/18/2015 1624 01/21/2015 1803 Full Code [id number]  Markus Daft, MD Inpatient   01/18/2015 0253 01/18/2015 1624 Full Code [id number]  Ivor Costa, MD Inpatient   Advance Care Planning Activity      Home/SNF/Other Home  Chief Complaint blood in urine  Level of Care/Admitting Diagnosis ED Disposition    ED Disposition Condition [place]: [facility] [100102]  Level of Care: Telemetry [5]  Admit to tele based on following criteria: Monitor for Ischemic changes  Covid Evaluation: Asymptomatic Screening Protocol (No Symptoms)  Diagnosis: Gross hematuria [599.71.ICD-9-CM]  Admitting Physician: Jani Gravel [[id number]]  Attending Physician: Jani Gravel [[id number]]  PT Class (Do Not Modify): Observation [104]  PT Acc Code (Do Not Modify): Observation [10022]       Medical History Past Medical History:  Diagnosis Date  . Anemia, unspecified   . Cancer ([place])    lung and prostate cancer per wife  . Carotid artery disease ([place])   . CKD (chronic kidney disease), stage IV ([place])   . Coronary artery disease    a. DES to LAD and Cx 02/2012.  [name] Dementia ([place])   . Dysuria 10/10/2010  . Emphysema 04/01/2012   By CT chest, august 2013  . ERECTILE DYSFUNCTION 04/05/2007  . GERD (gastroesophageal reflux disease)   . GI bleeding   . HYPERLIPIDEMIA 07/30/2008  . HYPERSOMNIA 10/02/2007  . Impaired glucose tolerance 02/09/2011  . INSOMNIA-SLEEP DISORDER-UNSPEC 10/22/2009  . LOW BACK PAIN 04/05/2007  . OSTEOARTHRITIS 04/05/2007  . PEPTIC ULCER  DISEASE 04/05/2007  . PERIPHERAL EDEMA 10/22/2009  . PROSTATE CANCER, HX OF 04/05/2007  . RESTLESS LEG SYNDROME 05/15/2007  . RHINITIS, ALLERGIC NOS 05/15/2007  . Sinus bradycardia 10/24/2010   a. prior h/o HR 40s, clonidine stopped 05/2017 due to this.  . Stroke (cerebrum) ([place])   . Unspecified visual loss 07/15/2008  . UTI 10/24/2010    Allergies Allergies  Allergen Reactions  . Amitriptyline Hcl Other (See Comments)    REACTION: confusion  . Diphenhydramine Hcl Other (See Comments)    Keeps him awake  . Simvastatin Other (See Comments)    REACTION: leg pain  . Clonidine Derivatives     Dizziness, falls, bradycardia  . Tylenol [Acetaminophen] Other (See Comments)    Dr advised pt not to take due to finding a spot on pt's kidney    IV Location/Drains/Wounds Patient Lines/Drains/Airways Status   Active Line/Drains/Airways    Name:   Placement date:   Placement time:   Site:   Days:   Peripheral IV 05/31/19 Left;Lateral Forearm   05/31/19    1300    Forearm   less than 1   Urethral Catheter Dillon Bjork, RN Double-lumen 24 Fr.   05/31/19    1140    Double-lumen   less than 1          Labs/Imaging Results for orders placed or performed during the hospital encounter of 05/31/19 (from the past 48 hour(s))  Urinalysis, Routine w reflex microscopic- may I&O cath  if menses     Status: Abnormal   Collection Time: 05/31/19 10:50 AM  Result Value Ref Range   Color, Urine STRAW (A) YELLOW   APPearance CLOUDY (A) CLEAR   Specific Gravity, Urine 1.018 1.005 - 1.030   pH 8.0 5.0 - 8.0   Glucose, UA 50 (A) NEGATIVE mg/dL   Hgb urine dipstick LARGE (A) NEGATIVE   Bilirubin Urine NEGATIVE NEGATIVE   Ketones, ur NEGATIVE NEGATIVE mg/dL   Protein, ur >=300 (A) NEGATIVE mg/dL   Nitrite NEGATIVE NEGATIVE   Leukocytes,Ua NEGATIVE NEGATIVE   RBC / HPF >50 (H) 0 - 5 RBC/hpf   WBC, UA >50 (H) 0 - 5 WBC/hpf   Bacteria, UA NONE SEEN NONE SEEN   WBC Clumps PRESENT     Comment: Performed at [facility], [place] [address], [place], [place] [address]  Type and screen [place]     Status: None (Preliminary result)   Collection Time: 05/31/19 11:16 AM  Result Value Ref Range   ABO/RH(D) A NEG    Antibody Screen NEG    Sample Expiration      06/03/2019,2359 Performed at [facility], [place] [address], [place], [place] [address]    Unit Number [id number]    Blood Component Type RED CELLS,LR    Unit division 00    Status of Unit ALLOCATED    Transfusion Status OK TO TRANSFUSE    Crossmatch Result Compatible   CBC     Status: Abnormal   Collection Time: 05/31/19  1:02 PM  Result Value Ref Range   WBC 8.1 4.0 - 10.5 K/uL   RBC 2.20 (L) 4.22 - 5.81 MIL/uL   Hemoglobin 6.7 (LL) 13.0 - 17.0 g/dL    Comment: This critical result has verified and been called to C FRANKLIN,RN by JACQUELINE HOLMES on 10 17 2020 at 1344, and has been read back. CRITICAL REUSLTS VERIFIED   HCT 22.6 (L) 39.0 - 52.0 %   MCV 102.7 (H) 80.0 - 100.0 fL   MCH 30.5 26.0 - 34.0 pg   MCHC 29.6 (L) 30.0 - 36.0 g/dL   RDW 13.4 11.5 - 15.5 %   Platelets 153 150 - 400 K/uL   nRBC 0.0 0.0 - 0.2 %    Comment: Performed at [facility], [place] [address], [place], [place] [address]  Basic metabolic panel     Status: Abnormal   Collection Time: 05/31/19  1:02 PM  Result Value Ref Range   Sodium 139 135 - 145 mmol/L   Potassium 4.6 3.5 - 5.1 mmol/L   Chloride 109 98 - 111 mmol/L   CO2 22 22 - 32 mmol/L   Glucose, Bld 134 (H) 70 - 99 mg/dL   BUN 45 (H) 8 - 23 mg/dL   Creatinine, Ser 2.46 (H) 0.61 - 1.24 mg/dL   Calcium 8.9 8.9 - 10.3 mg/dL   GFR calc non Af Amer 24 (L) >60 mL/min   GFR calc Af Amer 28 (L) >60 mL/min   Anion gap 8 5 - 15    Comment: Performed at [facility], [place] [address] [place], [place] [address]  Ammonia     Status: None   Collection Time: 05/31/19  1:30 PM  Result Value Ref Range   Ammonia 15 9 - 35  umol/L    Comment: Performed at [facility], [place] [address] [place],  [address]  Prepare RBC  Status: None   Collection Time: 05/31/19  2:30 PM  Result Value Ref Range   Order Confirmation      ORDER PROCESSED BY BLOOD BANK Performed at [facility], [place] [address] [place], [place] [address]    No results found.  Pending Labs [employer] (From admission, onward)    Start     Ordered   05/31/19 1424  SARS CORONAVIRUS 2 (TAT 6-24 HRS) Nasopharyngeal Nasopharyngeal Swab  (Asymptomatic/Tier 2 Patients Labs)  Once,   STAT    Question Answer Comment  Is this test for diagnosis or screening Screening   Symptomatic for COVID-19 as defined by CDC No   Hospitalized for COVID-19 No   Admitted to ICU for COVID-19 No   Previously tested for COVID-19 Yes   Resident in a congregate (group) care setting No   Employed in healthcare setting No      05/31/19 1424   05/31/19 1307  Hepatic function panel  ONCE - STAT,   STAT     05/31/19 1306   05/31/19 1050  Urine C&S  Once,   STAT     05/31/19 1050          Vitals/Pain Today's Vitals   05/31/19 1200 05/31/19 1300 05/31/19 1400 05/31/19 1500  BP: (!) 119/57 101/60 (!) 99/56 (!) 113/49  Pulse:  (!) 54 (!) 55 (!) 57  Resp:      Temp:      SpO2:  98% 98% 99%  PainSc:        Isolation Precautions No active isolations  Medications Medications  sodium chloride irrigation 0.9 % 3,000 mL (0 mLs Irrigation Stopped 05/31/19 1516)  0.9 %  sodium chloride infusion (Manually program via Guardrails IV Fluids) (has no administration in time range)    Mobility walks with person assist

## 2019-06-01 ENCOUNTER — Encounter: Payer: Self-pay | Admitting: Internal Medicine

## 2019-06-01 DIAGNOSIS — Z8711 Personal history of peptic ulcer disease: Secondary | ICD-10-CM | POA: Diagnosis not present

## 2019-06-01 DIAGNOSIS — E785 Hyperlipidemia, unspecified: Secondary | ICD-10-CM | POA: Diagnosis present

## 2019-06-01 DIAGNOSIS — N184 Chronic kidney disease, stage 4 (severe): Secondary | ICD-10-CM | POA: Diagnosis present

## 2019-06-01 DIAGNOSIS — Z9181 History of falling: Secondary | ICD-10-CM | POA: Diagnosis not present

## 2019-06-01 DIAGNOSIS — Z9079 Acquired absence of other genital organ(s): Secondary | ICD-10-CM | POA: Diagnosis not present

## 2019-06-01 DIAGNOSIS — Z87891 Personal history of nicotine dependence: Secondary | ICD-10-CM | POA: Diagnosis not present

## 2019-06-01 DIAGNOSIS — N2889 Other specified disorders of kidney and ureter: Secondary | ICD-10-CM | POA: Diagnosis not present

## 2019-06-01 DIAGNOSIS — R310 Gross hematuria: Secondary | ICD-10-CM | POA: Diagnosis not present

## 2019-06-01 DIAGNOSIS — F0150 Vascular dementia without behavioral disturbance: Secondary | ICD-10-CM | POA: Diagnosis not present

## 2019-06-01 DIAGNOSIS — D62 Acute posthemorrhagic anemia: Secondary | ICD-10-CM | POA: Diagnosis not present

## 2019-06-01 DIAGNOSIS — G8929 Other chronic pain: Secondary | ICD-10-CM | POA: Diagnosis present

## 2019-06-01 DIAGNOSIS — Z923 Personal history of irradiation: Secondary | ICD-10-CM | POA: Diagnosis not present

## 2019-06-01 DIAGNOSIS — I2510 Atherosclerotic heart disease of native coronary artery without angina pectoris: Secondary | ICD-10-CM | POA: Diagnosis not present

## 2019-06-01 DIAGNOSIS — Z8249 Family history of ischemic heart disease and other diseases of the circulatory system: Secondary | ICD-10-CM | POA: Diagnosis not present

## 2019-06-01 DIAGNOSIS — N390 Urinary tract infection, site not specified: Secondary | ICD-10-CM | POA: Diagnosis not present

## 2019-06-01 DIAGNOSIS — N183 Chronic kidney disease, stage 3 unspecified: Secondary | ICD-10-CM | POA: Diagnosis not present

## 2019-06-01 DIAGNOSIS — I129 Hypertensive chronic kidney disease with stage 1 through stage 4 chronic kidney disease, or unspecified chronic kidney disease: Secondary | ICD-10-CM | POA: Diagnosis present

## 2019-06-01 DIAGNOSIS — Z8673 Personal history of transient ischemic attack (TIA), and cerebral infarction without residual deficits: Secondary | ICD-10-CM | POA: Diagnosis not present

## 2019-06-01 DIAGNOSIS — K219 Gastro-esophageal reflux disease without esophagitis: Secondary | ICD-10-CM | POA: Diagnosis present

## 2019-06-01 DIAGNOSIS — N3041 Irradiation cystitis with hematuria: Secondary | ICD-10-CM | POA: Diagnosis present

## 2019-06-01 DIAGNOSIS — N3289 Other specified disorders of bladder: Secondary | ICD-10-CM | POA: Diagnosis not present

## 2019-06-01 DIAGNOSIS — D631 Anemia in chronic kidney disease: Secondary | ICD-10-CM | POA: Diagnosis present

## 2019-06-01 DIAGNOSIS — N179 Acute kidney failure, unspecified: Secondary | ICD-10-CM | POA: Diagnosis not present

## 2019-06-01 DIAGNOSIS — G629 Polyneuropathy, unspecified: Secondary | ICD-10-CM | POA: Diagnosis present

## 2019-06-01 DIAGNOSIS — Z955 Presence of coronary angioplasty implant and graft: Secondary | ICD-10-CM | POA: Diagnosis not present

## 2019-06-01 DIAGNOSIS — Z20828 Contact with and (suspected) exposure to other viral communicable diseases: Secondary | ICD-10-CM | POA: Diagnosis present

## 2019-06-01 DIAGNOSIS — Y842 Radiological procedure and radiotherapy as the cause of abnormal reaction of the patient, or of later complication, without mention of misadventure at the time of the procedure: Secondary | ICD-10-CM | POA: Diagnosis present

## 2019-06-01 DIAGNOSIS — C61 Malignant neoplasm of prostate: Secondary | ICD-10-CM | POA: Diagnosis present

## 2019-06-01 DIAGNOSIS — Z801 Family history of malignant neoplasm of trachea, bronchus and lung: Secondary | ICD-10-CM | POA: Diagnosis not present

## 2019-06-01 DIAGNOSIS — F0390 Unspecified dementia without behavioral disturbance: Secondary | ICD-10-CM | POA: Diagnosis present

## 2019-06-01 LAB — COMPREHENSIVE METABOLIC PANEL
ALT: 7 U/L (ref 0–44)
AST: 13 U/L — ABNORMAL LOW (ref 15–41)
Albumin: 2.6 g/dL — ABNORMAL LOW (ref 3.5–5.0)
Alkaline Phosphatase: 58 U/L (ref 38–126)
Anion gap: 8 (ref 5–15)
BUN: 49 mg/dL — ABNORMAL HIGH (ref 8–23)
CO2: 21 mmol/L — ABNORMAL LOW (ref 22–32)
Calcium: 8.5 mg/dL — ABNORMAL LOW (ref 8.9–10.3)
Chloride: 111 mmol/L (ref 98–111)
Creatinine, Ser: 2.83 mg/dL — ABNORMAL HIGH (ref 0.61–1.24)
GFR calc Af Amer: 23 mL/min — ABNORMAL LOW (ref >60)
GFR calc non Af Amer: 20 mL/min — ABNORMAL LOW (ref >60)
Glucose, Bld: 119 mg/dL — ABNORMAL HIGH (ref 70–99)
Potassium: 5.6 mmol/L — ABNORMAL HIGH (ref 3.5–5.1)
Sodium: 140 mmol/L (ref 135–145)
Total Bilirubin: 1 mg/dL (ref 0.3–1.2)
Total Protein: 4.8 g/dL — ABNORMAL LOW (ref 6.5–8.1)

## 2019-06-01 LAB — CBC
HCT: 20.6 % — ABNORMAL LOW (ref 39.0–52.0)
HCT: 24.4 % — ABNORMAL LOW (ref 39.0–52.0)
Hemoglobin: 6.4 g/dL — CL (ref 13.0–17.0)
Hemoglobin: 7.6 g/dL — ABNORMAL LOW (ref 13.0–17.0)
MCH: 30.2 pg (ref 26.0–34.0)
MCH: 30.6 pg (ref 26.0–34.0)
MCHC: 31.1 g/dL (ref 30.0–36.0)
MCHC: 31.1 g/dL (ref 30.0–36.0)
MCV: 96.8 fL (ref 80.0–100.0)
MCV: 98.6 fL (ref 80.0–100.0)
Platelets: 121 10*3/uL — ABNORMAL LOW (ref 150–400)
Platelets: 155 10*3/uL (ref 150–400)
RBC: 2.09 MIL/uL — ABNORMAL LOW (ref 4.22–5.81)
RBC: 2.52 MIL/uL — ABNORMAL LOW (ref 4.22–5.81)
RDW: 16.3 % — ABNORMAL HIGH (ref 11.5–15.5)
RDW: 16.9 % — ABNORMAL HIGH (ref 11.5–15.5)
WBC: 10.9 10*3/uL — ABNORMAL HIGH (ref 4.0–10.5)
WBC: 6.8 10*3/uL (ref 4.0–10.5)
nRBC: 0 % (ref 0.0–0.2)
nRBC: 0 % (ref 0.0–0.2)

## 2019-06-01 LAB — HEMOGLOBIN AND HEMATOCRIT, BLOOD
HCT: 22 % — ABNORMAL LOW (ref 39.0–52.0)
Hemoglobin: 6.8 g/dL — CL (ref 13.0–17.0)

## 2019-06-01 LAB — PREPARE RBC (CROSSMATCH)

## 2019-06-01 MED ORDER — SODIUM CHLORIDE 0.9% IV SOLUTION
Freq: Once | INTRAVENOUS | Status: AC
  Administered 2019-06-01: 08:00:00 via INTRAVENOUS

## 2019-06-01 MED ORDER — SODIUM CHLORIDE 0.9% IV SOLUTION
Freq: Once | INTRAVENOUS | Status: AC
  Administered 2019-06-01: 18:00:00 via INTRAVENOUS

## 2019-06-01 NOTE — Progress Notes (Signed)
Subjective: Patient reports  No complaints.  He is sleepy but arousable  Objective: Vital signs in last 24 hours: Temp:  [97.6 F (36.4 C)-98.9 F (37.2 C)] 98.9 F (37.2 C) (10/18 0820) Pulse Rate:  [54-74] 66 (10/18 0820) Resp:  [13-20] 16 (10/18 0820) BP: (99-183)/(48-69) 150/58 (10/18 0935) SpO2:  [93 %-100 %] 98 % (10/18 0820) Weight:  [68 kg] 68 kg (10/18 0500)  Intake/Output from previous day: 10/17 0701 - 10/18 0700 In: 9885.7 [I.V.:367.7; Blood:418; IV Piggyback:100] Out: 82423 [NTIRW:43154] Intake/Output this shift: Total I/O In: 3000 [Other:3000] Out: 0086 [Urine:3850]  Physical Exam:   no acute distress  abdomen soft and nontender, bladder not distended  GU: 3 way catheter in place.  Urine is pink on slow CBI drip.    Procedure:  Given the amount of clots last night went ahead and hand irrigated again this morning.  He irrigated to clear almost immediately without any clots.  I reconnected him to CBI at a slow drip and it was light pink to clear.  I swung back by about 30 minutes later and urine continued to remain pink.  Lab Results: Recent Labs    05/31/19 1302 05/31/19 2353 06/01/19 0519  HGB 6.7* 7.6* 6.4*  HCT 22.6* 24.4* 20.6*   BMET Recent Labs    05/31/19 1302 06/01/19 0519  NA 139 140  K 4.6 5.6*  CL 109 111  CO2 22 21*  GLUCOSE 134* 119*  BUN 45* 49*  CREATININE 2.46* 2.83*  CALCIUM 8.9 8.5*   No results for input(s): LABPT, INR in the last 72 hours. No results for input(s): LABURIN in the last 72 hours. Results for orders placed or performed during the hospital encounter of 05/31/19  SARS CORONAVIRUS 2 (TAT 6-24 HRS) Nasopharyngeal Nasopharyngeal Swab     Status: None   Collection Time: 05/31/19  3:18 PM   Specimen: Nasopharyngeal Swab  Result Value Ref Range Status   SARS Coronavirus 2 NEGATIVE NEGATIVE Final    Comment: (NOTE) SARS-CoV-2 target nucleic acids are NOT DETECTED. The SARS-CoV-2 RNA is generally detectable in  upper and lower respiratory specimens during the acute phase of infection. Negative results do not preclude SARS-CoV-2 infection, do not rule out co-infections with other pathogens, and should not be used as the sole basis for treatment or other patient management decisions. Negative results must be combined with clinical observations, patient history, and epidemiological information. The expected result is Negative. Fact Sheet for Patients: SugarRoll.be Fact Sheet for Healthcare Providers: https://www.woods-mathews.com/ This test is not yet approved or cleared by the [place] FDA and  has been authorized for detection and/or diagnosis of SARS-CoV-2 by FDA under an Emergency Use Authorization (EUA). This EUA will remain  in effect (meaning this test can be used) for the duration of the COVID-19 declaration under Section 56 4(b)(1) of the Act, 21 U.S.C. section 360bbb-3(b)(1), unless the authorization is terminated or revoked sooner. Performed at [facility], [place] [address], [place], [place] [address]     Studies/Results: US Pelvis Limited (transabdominal Only)  Result Date: 05/31/2019 CLINICAL DATA:  Hematuria.  Scan for clots. EXAM: LIMITED ULTRASOUND OF PELVIS TECHNIQUE: Limited transabdominal ultrasound examination of the pelvis was performed. COMPARISON:  None. FINDINGS: The study was suboptimal as the patient began vomiting in the study was ended early. A Foley catheter is identified. The bladder wall appears markedly thickened. No anechoic urine is seen between the bladder wall and the catheter balloon. There appears to be some heterogeneous  material around the Foley catheter. IMPRESSION: The study is limited as it had to be ended early due to patient condition. There is marked bladder wall thickening. There is heterogeneous material around the Foley catheter which could represent blood products given history. A bladder mass  cannot be confidently excluded on this study. Electronically Signed   By: Dorise Bullion III M.D   On: 05/31/2019 15:27    Assessment/Plan: Johney Maine hematuria-   Much better today.  Although his hemoglobin dropped there does not appear to be any ongoing significant active bleeding.  The goal would be to wean off CBI and trial of voiding in the next couple of days.  If he fails he may need cystoscopy with possible TURP possible TURBT.       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  Dr. Tammi Klippel is considering other options.  As above, no recent evidence of local or metastatic recurrence.    I will notify Dr. Tresa Moore of admission.   LOS: 0 days   Festus Aloe 06/01/2019, 10:42 AM

## 2019-06-01 NOTE — Assessment & Plan Note (Signed)
stable overall by history and exam, recent data reviewed with pt, and pt to continue medical treatment as before,  to f/u any worsening symptoms or concerns  

## 2019-06-01 NOTE — Addendum Note (Signed)
Addended by: Biagio Borg on: 06/01/2019 01:44 PM   Modules accepted: Level of Service

## 2019-06-01 NOTE — Progress Notes (Signed)
PROGRESS NOTE    Victor Davis  WGN:[id number] DOB: 1940-12-17 DOA: 05/31/2019 PCP: Biagio Borg, MD    Brief Narrative:  78 year old, prostate cancer castrate resistant, CKD stage IV, coronary artery disease presents to the hospital with sudden onset gross hematuria and blood loss anemia.  Patient was started on three-way Foley catheter and CBI and admitted to the hospital.  Followed by urology.   Assessment & Plan:   Active Problems:   PROSTATE CANCER, HX OF   CAD (coronary artery disease)   Hypertension   Acute lower UTI   Dementia (HCC)   CKD (chronic kidney disease), stage III (HCC)   Gross hematuria   Acute blood loss anemia  Gross hematuria: Due to prostate cancer.  Still persistent hematuria.  Currently on continuous bladder irrigation.  Frequent manual irrigation to avoid clots.  Blood transfusions.  Maintenance IV fluids.  Followed by urology.  Empiric antibiotics.  Urine cultures pending.  Further management as per urology.  Acute blood loss anemia in the setting of anemia of chronic disease: Receiving second units of PRBC today.  Hemoglobin was less than 7.  Continue blood transfusion to keep hemoglobin more than 7.  Check every 12 hours to ensure stabilization.  Acute kidney injury on chronic kidney disease stage IV: Renal functions fluctuate.  No urinary obstruction.  We will continue monitoring.  Hypertension: On hydralazine and amlodipine that he will continue.  History of coronary artery disease: Used to be on Plavix and aspirin.  Plavix was discontinued sometime ago as per reports, aspirin on hold.  No active coronary artery disease.  Dementia: With no behavior problems.  On Aricept and Celexa.  Continue.   DVT prophylaxis: SCDs Code Status: Full code Family Communication: None Disposition Plan: Remains inpatient   Consultants:   Neurology  Procedures:   Severe  Antimicrobials:   Rocephin, 05/31/2019---   Subjective: Patient seen and  examined.  Poor historian.  Denies any complaints.  Denies any abdominal pain. CBI running, no active blood clots, afebrile.  Objective: Vitals:   06/01/19 0531 06/01/19 0755 06/01/19 0820 06/01/19 0935  BP: (!) 137/51 (!) 145/55 (!) 136/48 (!) 150/58  Pulse: 74 62 66   Resp: 14 20 16    Temp: 98.5 F (36.9 C) 98.8 F (37.1 C) 98.9 F (37.2 C)   TempSrc: Oral Oral Axillary   SpO2: 93% 99% 98%   Weight:        Intake/Output Summary (Last 24 hours) at 06/01/2019 1029 Last data filed at 06/01/2019 0934 Gross per 24 hour  Intake 12885.69 ml  Output 23125 ml  Net -10239.31 ml   Filed Weights   06/01/19 0500  Weight: 68 kg    Examination:  General exam: Appears calm and comfortable, chronically sick looking.  Comfortably eating breakfast.  On room air. Respiratory system: Clear to auscultation. Respiratory effort normal. Cardiovascular system: S1 & S2 heard, RRR. No JVD, murmurs, rubs, gallops or clicks. No pedal edema. Gastrointestinal system: Abdomen is nondistended, soft and nontender. No organomegaly or masses felt. Normal bowel sounds heard. Foley catheter with pink freely flowing urine. Central nervous system: Alert and oriented. No focal neurological deficits. Extremities: Symmetric 5 x 5 power. Skin: No rashes, lesions or ulcers Psychiatry: Judgement and insight appear normal. Mood & affect appropriate.     Data Reviewed: I have personally reviewed following labs and imaging studies  CBC: Recent Labs  Lab 05/31/19 1302 05/31/19 2353 06/01/19 0519  WBC 8.1 10.9* 6.8  HGB 6.7* 7.6* 6.4*  HCT 22.6* 24.4* 20.6*  MCV 102.7* 96.8 98.6  PLT 153 155 585*   Basic Metabolic Panel: Recent Labs  Lab 05/31/19 1302 06/01/19 0519  NA 139 140  K 4.6 5.6*  CL 109 111  CO2 22 21*  GLUCOSE 134* 119*  BUN 45* 49*  CREATININE 2.46* 2.83*  CALCIUM 8.9 8.5*   GFR: Estimated Creatinine Clearance: 20.4 mL/min (A) (by C-G formula based on SCr of 2.83 mg/dL (H)). Liver  Function Tests: Recent Labs  Lab 05/31/19 1307 06/01/19 0519  AST 14* 13*  ALT 8 7  ALKPHOS 63 58  BILITOT 1.1 1.0  PROT 5.7* 4.8*  ALBUMIN 3.0* 2.6*   No results for input(s): LIPASE, AMYLASE in the last 168 hours. Recent Labs  Lab 05/31/19 1330  AMMONIA 15   Coagulation Profile: No results for input(s): INR, PROTIME in the last 168 hours. Cardiac Enzymes: No results for input(s): CKTOTAL, CKMB, CKMBINDEX, TROPONINI in the last 168 hours. BNP (last 3 results) No results for input(s): PROBNP in the last 8760 hours. HbA1C: Recent Labs    05/29/19 1152  HGBA1C 5.0   CBG: No results for input(s): GLUCAP in the last 168 hours. Lipid Profile: No results for input(s): CHOL, HDL, LDLCALC, TRIG, CHOLHDL, LDLDIRECT in the last 72 hours. Thyroid Function Tests: No results for input(s): TSH, T4TOTAL, FREET4, T3FREE, THYROIDAB in the last 72 hours. Anemia Panel: No results for input(s): VITAMINB12, FOLATE, FERRITIN, TIBC, IRON, RETICCTPCT in the last 72 hours. Sepsis Labs: No results for input(s): PROCALCITON, LATICACIDVEN in the last 168 hours.  Recent Results (from the past 240 hour(s))  SARS CORONAVIRUS 2 (TAT 6-24 HRS) Nasopharyngeal Nasopharyngeal Swab     Status: None   Collection Time: 05/31/19  3:18 PM   Specimen: Nasopharyngeal Swab  Result Value Ref Range Status   SARS Coronavirus 2 NEGATIVE NEGATIVE Final    Comment: (NOTE) SARS-CoV-2 target nucleic acids are NOT DETECTED. The SARS-CoV-2 RNA is generally detectable in upper and lower respiratory specimens during the acute phase of infection. Negative results do not preclude SARS-CoV-2 infection, do not rule out co-infections with other pathogens, and should not be used as the sole basis for treatment or other patient management decisions. Negative results must be combined with clinical observations, patient history, and epidemiological information. The expected result is Negative. Fact Sheet for  Patients: SugarRoll.be Fact Sheet for Healthcare Providers: https://www.woods-mathews.com/ This test is not yet approved or cleared by the [place] FDA and  has been authorized for detection and/or diagnosis of SARS-CoV-2 by FDA under an Emergency Use Authorization (EUA). This EUA will remain  in effect (meaning this test can be used) for the duration of the COVID-19 declaration under Section 56 4(b)(1) of the Act, 21 U.S.C. section 360bbb-3(b)(1), unless the authorization is terminated or revoked sooner. Performed at [facility], [place] [address], [place], [place] [address]          Radiology Studies: US Pelvis Limited (transabdominal Only)  Result Date: 05/31/2019 CLINICAL DATA:  Hematuria.  Scan for clots. EXAM: LIMITED ULTRASOUND OF PELVIS TECHNIQUE: Limited transabdominal ultrasound examination of the pelvis was performed. COMPARISON:  None. FINDINGS: The study was suboptimal as the patient began vomiting in the study was ended early. A Foley catheter is identified. The bladder wall appears markedly thickened. No anechoic urine is seen between the bladder wall and the catheter balloon. There appears to be some heterogeneous material around the Foley catheter. IMPRESSION: The study is limited as it had to  be ended early due to patient condition. There is marked bladder wall thickening. There is heterogeneous material around the Foley catheter which could represent blood products given history. A bladder mass cannot be confidently excluded on this study. Electronically Signed   By: Dorise Bullion III M.D   On: 05/31/2019 15:27        Scheduled Meds: . sodium chloride   Intravenous Once  . amLODipine  5 mg Oral Daily  . Chlorhexidine Gluconate Cloth  6 each Topical Daily  . cholecalciferol  2,000 Units Oral Daily  . citalopram  20 mg Oral Daily  . donepezil  5 mg Oral QHS  . ferrous sulfate  325 mg Oral Daily  . gabapentin   300 mg Oral BID  . hydrALAZINE  25 mg Oral TID  . isosorbide mononitrate  30 mg Oral Daily  . ketotifen  1 drop Both Eyes BID  . pantoprazole  40 mg Oral Daily  . pravastatin  40 mg Oral q1800  . predniSONE  5 mg Oral QHS  . sodium chloride flush  3 mL Intravenous Q12H  . thiamine  100 mg Oral Daily  . vitamin B-12  1,000 mcg Oral Daily  . vitamin C  500 mg Oral Daily   Continuous Infusions: . sodium chloride    . cefTRIAXone (ROCEPHIN)  IV 1 g (05/31/19 2109)  . sodium chloride irrigation 0 mL (05/31/19 1516)     LOS: 0 days    Time spent: 30 minutes    Barb Merino, MD [facility] Pager [phone]

## 2019-06-01 NOTE — Assessment & Plan Note (Signed)
For IM replacement today, then start po

## 2019-06-01 NOTE — Progress Notes (Signed)
CRITICAL VALUE ALERT  Critical Value:  Hemoglobin 6.8  Date & Time Notied:  06/01/2019, 1700  Provider Notified: MD Ghimire  Orders Received/Actions taken: Awaiting new orders.

## 2019-06-01 NOTE — Progress Notes (Signed)
Large amount of clots hand irrigated at 1500. Since then, urine straw colored and clear. CBI reduced to slow rate per Urology. Will continue to monitor pt.

## 2019-06-01 NOTE — Assessment & Plan Note (Addendum)
stable overall by history and exam, recent data reviewed with pt, and pt to continue medical treatment as before,  to f/u any worsening symptoms or concerns  Note:  Total time for pt hx, exam, review of record with pt in the room, determination of diagnoses and plan for further eval and tx is > 40 min, with over 50% spent in coordination and counseling of patient including the differential dx, tx, further evaluation and other management of dementia, b2 defiiency, hyperglycemia, HTN, CKD

## 2019-06-01 NOTE — Progress Notes (Signed)
CRITICAL VALUE ALERT  Critical Value:  Hemoglobin 6.4  Date & Time Notied:  06/01/2019; 4081  Provider Notified: Lamar Blinks, NP  Orders Received/Actions taken: awaiting new orders

## 2019-06-02 ENCOUNTER — Telehealth: Payer: Self-pay | Admitting: Internal Medicine

## 2019-06-02 LAB — TYPE AND SCREEN
ABO/RH(D): A NEG
Antibody Screen: NEGATIVE
Unit division: 0
Unit division: 0
Unit division: 0

## 2019-06-02 LAB — BASIC METABOLIC PANEL
Anion gap: 7 (ref 5–15)
BUN: 51 mg/dL — ABNORMAL HIGH (ref 8–23)
CO2: 21 mmol/L — ABNORMAL LOW (ref 22–32)
Calcium: 8.3 mg/dL — ABNORMAL LOW (ref 8.9–10.3)
Chloride: 110 mmol/L (ref 98–111)
Creatinine, Ser: 2.69 mg/dL — ABNORMAL HIGH (ref 0.61–1.24)
GFR calc Af Amer: 25 mL/min — ABNORMAL LOW (ref >60)
GFR calc non Af Amer: 22 mL/min — ABNORMAL LOW (ref >60)
Glucose, Bld: 99 mg/dL (ref 70–99)
Potassium: 4.4 mmol/L (ref 3.5–5.1)
Sodium: 138 mmol/L (ref 135–145)

## 2019-06-02 LAB — BPAM RBC
Blood Product Expiration Date: 202010282359
Blood Product Expiration Date: 202011022359
Blood Product Expiration Date: 202011082359
ISSUE DATE / TIME: 202010171715
ISSUE DATE / TIME: 202010180758
ISSUE DATE / TIME: 202010181734
Unit Type and Rh: 600
Unit Type and Rh: 600
Unit Type and Rh: 600

## 2019-06-02 LAB — HEMOGLOBIN AND HEMATOCRIT, BLOOD
HCT: 25.7 % — ABNORMAL LOW (ref 39.0–52.0)
HCT: 27.1 % — ABNORMAL LOW (ref 39.0–52.0)
Hemoglobin: 8 g/dL — ABNORMAL LOW (ref 13.0–17.0)
Hemoglobin: 8.7 g/dL — ABNORMAL LOW (ref 13.0–17.0)

## 2019-06-02 NOTE — Telephone Encounter (Signed)
Patients spouse spoke with [facility] on 05/31/19 at 9:34am.  States that patient has pure blood in his urine that had started an hour prior.  Patient was advised to go to ED.   Patient did go to [facility].

## 2019-06-02 NOTE — Telephone Encounter (Signed)
Noted  

## 2019-06-02 NOTE — Progress Notes (Signed)
Subjective/Chief Complaint:  1 - High Risk Prostate Cancer - s/p radical prostatectomy 08/2000 Gleason 4+5=9    Recent Summarized Course:  2003 - Salvage XRT (Dr. Tammi Klippel) with Nadir undetectable --> biochemical recurrence --> intermitant ADT with Lupron   08/2014 PSA 6.39 --> (no new bone pain / localizing symtpoms) --> Lupron 45mg ; 02/2015 PSA 1.97 / T 22; 05/2015 PSA 3.91, T 21 --> Lupron 45 + starting bicalutamide  08/2015 PSA 5.55 / T (on Lupron + bicalutamide) --> starting Xtandi; 11/2015 PSA 4.2 / T 33 --> STOP Xtandi due to extreme fatigue; 03/2016 - PSA 8.55  06/2016 CT, Bone Scan - no measurable disease, no hydro PSA 7.72 / T 27 ==> Lupron 45  02/2017 - PSA 14.5, Cr 1.9, T 11 ==> Lupron 45; 05/2017 PSA 29.7, Cr 1.6, T <10 on Lupron, Start Zytiga + Pred  08/2017 - PSA 17.7, Cr 2.0, T <10 on Lupron + Zytiga + Pred; 01/2018 PSA 10.4, T <10, Cr 1.8 on Lupron + Zytiga + Pred  09/2018 - PSA 18.5, T <10 on Lupron + Zytiga + Pred; 03/2019 PSA 52, T<10, Cr 2.2 CT, BS - no measurable disease on Lupron + Zytiga + Pred    2 - Mixed Urinary Incontinence - manages with adult diaper plus Toviaz for urge and stress incontinence. PVR 08/2015 "45mL". Becoming more botherseome, tryng daytime penile clamp 01/2018.    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 LIbido preserved. Unaded gets partial erection not able to penetrate. He does use long-acting nitro. Presently manages with exernal pump prn with moderate satisvaction.   5 - Stage 3 Renal Insufficiency - Cr 1.6-2 x years. Piror renal imaging w/o sig hydro.   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 Progressive prostate cancer as per above. On Plavix at baseline, now being held. Received transfusion x several on admit. Hgb 10/19 8.8 up from 6.8 and with 3 way catheter  on irrigation. CBI slowly weaned to off and Hgb stable 10/23.   Today Amarian is stable. Had some recurrent transient hematuria earlier today and CBI briefly restared, urine clear now again.    Objective: Vital signs in last 24 hours: Temp:  [98 F (36.7 C)-100.9 F (38.3 C)] 98 F (36.7 C) (10/19 0336) Pulse Rate:  [66-86] 66 (10/19 0336) Resp:  [14-18] 18 (10/19 0336) BP: (122-153)/(54-70) 122/58 (10/19 0336) SpO2:  [92 %-94 %] 92 % (10/19 0336) Weight:  [58.1 kg] 58.1 kg (10/19 0329) Last BM Date: 06/01/19  Intake/Output from previous day: 10/18 0701 - 10/19 0700 In: 6900 [P.O.:50; Blood:750; IV Piggyback:100] Out: 9050 [Urine:9050] Intake/Output this shift: Total I/O In: -  Out: 900 [Urine:900]  General appearance: alert, cooperative and frail, at baseline. Pleasant.  Eyes: negative Nose: Nares normal. Septum midline. Mucosa normal. No drainage or sinus tenderness. Throat: lips, mucosa, and tongue normal; teeth and gums normal Neck: supple, symmetrical, trachea midline Back: symmetric, no curvature. ROM normal. No CVA tenderness. Resp: non-labored on room air.  Cardio: Nl rate GI: soft, non-tender; bowel sounds normal; no masses,  no organomegaly Male genitalia: normal, 3 way foley in place with clear yellow urine off irrigation again.  Extremities: extremities normal, atraumatic, no cyanosis or edema Pulses: 2+ and symmetric Skin: Skin color, texture, turgor normal. No rashes or  lesions Lymph nodes: Cervical, supraclavicular, and axillary nodes normal.  Lab Results:  Recent Labs    05/31/19 2353 06/01/19 0519 06/01/19 1637 06/02/19 0428  WBC 10.9* 6.8  --   --   HGB 7.6* 6.4* 6.8* 8.0*  HCT 24.4* 20.6* 22.0* 25.7*  PLT 155 121*  --   --    BMET Recent Labs    06/01/19 0519 06/02/19 0428  NA 140 138  K 5.6* 4.4  CL 111 110  CO2 21* 21*  GLUCOSE 119* 99  BUN 49* 51*  CREATININE 2.83* 2.69*  CALCIUM 8.5* 8.3*   PT/INR No results for input(s):  LABPROT, INR in the last 72 hours. ABG No results for input(s): PHART, HCO3 in the last 72 hours.  Invalid input(s): PCO2, PO2  Studies/Results: US Pelvis Limited (transabdominal Only)  Result Date: 05/31/2019 CLINICAL DATA:  Hematuria.  Scan for clots. EXAM: LIMITED ULTRASOUND OF PELVIS TECHNIQUE: Limited transabdominal ultrasound examination of the pelvis was performed. COMPARISON:  None. FINDINGS: The study was suboptimal as the patient began vomiting in the study was ended early. A Foley catheter is identified. The bladder wall appears markedly thickened. No anechoic urine is seen between the bladder wall and the catheter balloon. There appears to be some heterogeneous material around the Foley catheter. IMPRESSION: The study is limited as it had to be ended early due to patient condition. There is marked bladder wall thickening. There is heterogeneous material around the Foley catheter which could represent blood products given history. A bladder mass cannot be confidently excluded on this study. 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 He has very poor functional status and is not candidate for aggressive surgery or cytotoxic chemotherapy. Agree with holding blood thinners and bladder irrigation for now. Would consider operative clot evav / fulgeration if hematuria remains transfusiton dependant only. This is unlikley to occur.  May consider foley removal / trial of void tomorrow as his catheter is now more likley liability than asset as heamturia sig improved.   Will likely benefit from  hospice transiiton in next year or so based on current disease progression and functional decline.   Appreciate hospitalist service comanagment.

## 2019-06-02 NOTE — Evaluation (Signed)
Physical Therapy Evaluation Patient Details Name: Victor Davis MRN: [id number] DOB: Oct 11, 1940 Today's Date: 06/02/2019   History of Present Illness  79 year old, prostate cancer castrate resistant, CKD stage IV, coronary artery disease presents to the hospital with sudden onset gross hematuria and blood loss anemia.  Clinical Impression  Pt admitted with above diagnosis.  Pt currently with functional limitations due to the deficits listed below (see PT Problem List). Pt will benefit from skilled PT to increase their independence and safety with mobility to allow discharge to the venue listed below. Pt moves impulsively at times, but did well with RW. He reports 3-4 falls in last 6 months with cane. Recommended him to use his RW at home.  Recommend HHPT to assess pt in his own environment.      Follow Up Recommendations Home health PT    Equipment Recommendations  None recommended by PT    Recommendations for Other Services       Precautions / Restrictions Precautions Precautions: Fall      Mobility  Bed Mobility Overal bed mobility: Needs Assistance Bed Mobility: Supine to Sit     Supine to sit: HOB elevated;Supervision     General bed mobility comments: Came to EOB with S and HOB elevated and came immediatlely into standing despite cues to wait.  Transfers Overall transfer level: Needs assistance Equipment used: Rolling walker (2 wheeled) Transfers: Sit to/from Stand Sit to Stand: Min guard         General transfer comment: Impulsive with standing. Stood from bed and [facility]  Ambulation/Gait Ambulation/Gait assistance: Counsellor (Feet): 35 Feet Assistive device: Rolling walker (2 wheeled) Gait Pattern/deviations: Step-through pattern     General Gait Details: Amb in room with RW and MIN/guard for safety. PT carrying irrigation bags on pole. Stayed in room due to possible bed bugs.  Stairs            Wheelchair Mobility    Modified  Rankin (Stroke Patients Only)       Balance Overall balance assessment: History of Falls;Needs assistance   Sitting balance-Leahy Scale: Good     Standing balance support: During functional activity Standing balance-Leahy Scale: Fair Standing balance comment: fair balance, but hx of falls.                             Pertinent Vitals/Pain Pain Assessment: No/denies pain    Home Living Family/patient expects to be discharged to:: Private residence Living Arrangements: Spouse/significant other   Type of Home: House Home Access: Stairs to enter Entrance Stairs-Rails: Right Entrance Stairs-Number of Steps: 3 Home Layout: One level Home Equipment: Cane - single point;Walker - 2 wheels      Prior Function Level of Independence: Independent with assistive device(s)         Comments: Amb with cane     Hand Dominance        Extremity/Trunk Assessment   Upper Extremity Assessment Upper Extremity Assessment: Overall WFL for tasks assessed    Lower Extremity Assessment Lower Extremity Assessment: Overall WFL for tasks assessed    Cervical / Trunk Assessment Cervical / Trunk Assessment: Normal  Communication   Communication: No difficulties  Cognition Arousal/Alertness: Awake/alert Behavior During Therapy: WFL for tasks assessed/performed Overall Cognitive Status: No family/caregiver present to determine baseline cognitive functioning Area of Impairment: Orientation                 Orientation Level: Disoriented to;Place(thinks  he is at home)                    General Comments      Exercises     Assessment/Plan    PT Assessment Patient needs continued PT services  PT Problem List Decreased strength;Decreased activity tolerance;Decreased balance;Decreased mobility;Decreased knowledge of use of DME;Decreased safety awareness;Decreased knowledge of precautions;Decreased cognition       PT Treatment Interventions DME  instruction;Gait training;Stair training;Functional mobility training;Neuromuscular re-education;Balance training;Therapeutic exercise;Therapeutic activities;Patient/family education    PT Goals (Current goals can be found in the Care Plan section)  Acute Rehab PT Goals Patient Stated Goal: "to dance" PT Goal Formulation: With patient Time For Goal Achievement: 06/16/19 Potential to Achieve Goals: Good    Frequency Min 3X/week   Barriers to discharge        Co-evaluation               AM-PAC PT "6 Clicks" Mobility  Outcome Measure Help needed turning from your back to your side while in a flat bed without using bedrails?: None Help needed moving from lying on your back to sitting on the side of a flat bed without using bedrails?: None Help needed moving to and from a bed to a chair (including a wheelchair)?: A Little Help needed standing up from a chair using your arms (e.g., wheelchair or bedside chair)?: A Little Help needed to walk in hospital room?: A Little Help needed climbing 3-5 steps with a railing? : A Little 6 Click Score: 20    End of Session   Activity Tolerance: Patient tolerated treatment well Patient left: in chair;with call bell/phone within reach;with chair alarm set Nurse Communication: Mobility status PT Visit Diagnosis: Unsteadiness on feet (R26.81);Difficulty in walking, not elsewhere classified (R26.2);Repeated falls (R29.6)    Time: 1683-7290 PT Time Calculation (min) (ACUTE ONLY): 28 min   Charges:   PT Evaluation $PT Eval Low Complexity: 1 Low PT Treatments $Gait Training: 8-22 mins        Lailany Enoch L. Tamala Julian, [place] Pager [phone] 06/02/2019   Galen Manila 06/02/2019, 2:26 PM

## 2019-06-02 NOTE — Progress Notes (Signed)
PROGRESS NOTE    Victor Davis  WHQ:[id number] DOB: 10/23/1940 DOA: 05/31/2019 PCP: Biagio Borg, MD    Brief Narrative:  78 year old, prostate cancer castrate resistant, CKD stage IV, coronary artery disease presents to the hospital with sudden onset gross hematuria and blood loss anemia.  Patient was started on three-way Foley catheter and CBI and admitted to the hospital.  Followed by urology.   Assessment & Plan:   Active Problems:   PROSTATE CANCER, HX OF   CAD (coronary artery disease)   Hypertension   Acute lower UTI   Dementia (HCC)   CKD (chronic kidney disease), stage III (HCC)   Gross hematuria   Acute blood loss anemia  Gross hematuria: Due to prostate cancer.  Treated with CBI.  Some improvement and clearing of hematuria today. Blood transfusions.  Maintenance IV fluids.  Followed by urology.  Empiric antibiotics.  Urine cultures with no significant growth.  We will continue antibiotics when running CBI. Further management as per urology.  Acute blood loss anemia in the setting of anemia of chronic disease: Received total 3 units of PRBC.  Hemoglobin is 8.  Continue blood transfusion to keep hemoglobin more than 7.  Check every 12 hours to ensure stabilization.  Acute kidney injury on chronic kidney disease stage IV: Renal functions fluctuate.  No urinary obstruction.  We will continue monitoring.  Hypertension: On hydralazine and amlodipine that he will continue.  History of coronary artery disease: Used to be on Plavix and aspirin.  Plavix was discontinued sometime ago as per reports, aspirin on hold.  No active coronary artery disease.  Dementia: With no behavior problems.  On Aricept and Celexa.  Continue.  Hematuria is clearing today.  Consult PT OT, out of bed.   DVT prophylaxis: SCDs Code Status: Full code Family Communication: None Disposition Plan: Remains inpatient, anticipate home   Consultants:   Neurology  Procedures:    Severe  Antimicrobials:   Rocephin, 05/31/2019---   Subjective: Patient seen and examined.  No overnight events.  Denies any complaints.  Denies any abdominal pain or painful micturition.  CBI running with minimal irrigation and urine is clear.  He states that he lives at home with his wife and will be going home after discharge.  Has not mobilized since admission. Objective: Vitals:   06/01/19 2100 06/01/19 2144 06/02/19 0329 06/02/19 0336  BP: (!) 151/70   (!) 122/58  Pulse: 86   66  Resp: 18   18  Temp: (!) 100.9 F (38.3 C) 98.4 F (36.9 C)  98 F (36.7 C)  TempSrc: Oral Oral  Oral  SpO2: 94%   92%  Weight:   58.1 kg   Height:        Intake/Output Summary (Last 24 hours) at 06/02/2019 1119 Last data filed at 06/02/2019 0916 Gross per 24 hour  Intake 3820 ml  Output 5200 ml  Net -1380 ml   Filed Weights   06/01/19 0500 06/02/19 0329  Weight: 68 kg 58.1 kg    Examination:  General exam: Appears calm and comfortable, chronically sick looking.   Respiratory system: Clear to auscultation. Respiratory effort normal. Cardiovascular system: S1 & S2 heard, RRR. No JVD, murmurs, rubs, gallops or clicks. No pedal edema. Gastrointestinal system: Abdomen is nondistended, soft and nontender. No organomegaly or masses felt. Normal bowel sounds heard. Foley catheter with clear urine. Central nervous system: Alert and oriented. No focal neurological deficits. Extremities: Symmetric 5 x 5 power. Skin: No rashes, lesions or  ulcers Psychiatry: Judgement and insight appear normal. Mood & affect appropriate.     Data Reviewed: I have personally reviewed following labs and imaging studies  CBC: Recent Labs  Lab 05/31/19 1302 05/31/19 2353 06/01/19 0519 06/01/19 1637 06/02/19 0428  WBC 8.1 10.9* 6.8  --   --   HGB 6.7* 7.6* 6.4* 6.8* 8.0*  HCT 22.6* 24.4* 20.6* 22.0* 25.7*  MCV 102.7* 96.8 98.6  --   --   PLT 153 155 121*  --   --    Basic Metabolic Panel: Recent  Labs  Lab 05/31/19 1302 06/01/19 0519 06/02/19 0428  NA 139 140 138  K 4.6 5.6* 4.4  CL 109 111 110  CO2 22 21* 21*  GLUCOSE 134* 119* 99  BUN 45* 49* 51*  CREATININE 2.46* 2.83* 2.69*  CALCIUM 8.9 8.5* 8.3*   GFR: Estimated Creatinine Clearance: 18.3 mL/min (A) (by C-G formula based on SCr of 2.69 mg/dL (H)). Liver Function Tests: Recent Labs  Lab 05/31/19 1307 06/01/19 0519  AST 14* 13*  ALT 8 7  ALKPHOS 63 58  BILITOT 1.1 1.0  PROT 5.7* 4.8*  ALBUMIN 3.0* 2.6*   No results for input(s): LIPASE, AMYLASE in the last 168 hours. Recent Labs  Lab 05/31/19 1330  AMMONIA 15   Coagulation Profile: No results for input(s): INR, PROTIME in the last 168 hours. Cardiac Enzymes: No results for input(s): CKTOTAL, CKMB, CKMBINDEX, TROPONINI in the last 168 hours. BNP (last 3 results) No results for input(s): PROBNP in the last 8760 hours. HbA1C: No results for input(s): HGBA1C in the last 72 hours. CBG: No results for input(s): GLUCAP in the last 168 hours. Lipid Profile: No results for input(s): CHOL, HDL, LDLCALC, TRIG, CHOLHDL, LDLDIRECT in the last 72 hours. Thyroid Function Tests: No results for input(s): TSH, T4TOTAL, FREET4, T3FREE, THYROIDAB in the last 72 hours. Anemia Panel: No results for input(s): VITAMINB12, FOLATE, FERRITIN, TIBC, IRON, RETICCTPCT in the last 72 hours. Sepsis Labs: No results for input(s): PROCALCITON, LATICACIDVEN in the last 168 hours.  Recent Results (from the past 240 hour(s))  Urine C&S     Status: Abnormal (Preliminary result)   Collection Time: 05/31/19 10:50 AM   Specimen: Urine, Clean Catch  Result Value Ref Range Status   Specimen Description   Final    URINE, CLEAN CATCH Performed at [facility], [place] [address] [place], [place] [address]    Special Requests   Final    NONE Performed at [facility], [place] [address], [place], [place] [address]    Culture (A)  Final    20,000  COLONIES/mL GRAM NEGATIVE RODS IDENTIFICATION AND SUSCEPTIBILITIES TO FOLLOW Performed at [facility], [place] [address], [place], [place] [address]    Report Status PENDING  Incomplete  SARS CORONAVIRUS 2 (TAT 6-24 HRS) Nasopharyngeal Nasopharyngeal Swab     Status: None   Collection Time: 05/31/19  3:18 PM   Specimen: Nasopharyngeal Swab  Result Value Ref Range Status   SARS Coronavirus 2 NEGATIVE NEGATIVE Final    Comment: (NOTE) SARS-CoV-2 target nucleic acids are NOT DETECTED. The SARS-CoV-2 RNA is generally detectable in upper and lower respiratory specimens during the acute phase of infection. Negative results do not preclude SARS-CoV-2 infection, do not rule out co-infections with other pathogens, and should not be used as the sole basis for treatment or other patient management decisions. Negative results must be combined with clinical observations, patient history, and epidemiological information. The  expected result is Negative. Fact Sheet for Patients: SugarRoll.be Fact Sheet for Healthcare Providers: https://www.woods-mathews.com/ This test is not yet approved or cleared by the [place] FDA and  has been authorized for detection and/or diagnosis of SARS-CoV-2 by FDA under an Emergency Use Authorization (EUA). This EUA will remain  in effect (meaning this test can be used) for the duration of the COVID-19 declaration under Section 56 4(b)(1) of the Act, 21 U.S.C. section 360bbb-3(b)(1), unless the authorization is terminated or revoked sooner. Performed at [facility], [place] [address], [place], [place] [address]          Radiology Studies: US Pelvis Limited (transabdominal Only)  Result Date: 05/31/2019 CLINICAL DATA:  Hematuria.  Scan for clots. EXAM: LIMITED ULTRASOUND OF PELVIS TECHNIQUE: Limited transabdominal ultrasound examination of the pelvis was performed. COMPARISON:  None. FINDINGS: The study  was suboptimal as the patient began vomiting in the study was ended early. A Foley catheter is identified. The bladder wall appears markedly thickened. No anechoic urine is seen between the bladder wall and the catheter balloon. There appears to be some heterogeneous material around the Foley catheter. IMPRESSION: The study is limited as it had to be ended early due to patient condition. There is marked bladder wall thickening. There is heterogeneous material around the Foley catheter which could represent blood products given history. A bladder mass cannot be confidently excluded on this study. Electronically Signed   By: Dorise Bullion III M.D   On: 05/31/2019 15:27        Scheduled Meds: . sodium chloride   Intravenous Once  . amLODipine  5 mg Oral Daily  . Chlorhexidine Gluconate Cloth  6 each Topical Daily  . cholecalciferol  2,000 Units Oral Daily  . citalopram  20 mg Oral Daily  . donepezil  5 mg Oral QHS  . ferrous sulfate  325 mg Oral Daily  . gabapentin  300 mg Oral BID  . hydrALAZINE  25 mg Oral TID  . isosorbide mononitrate  30 mg Oral Daily  . ketotifen  1 drop Both Eyes BID  . pantoprazole  40 mg Oral Daily  . pravastatin  40 mg Oral q1800  . predniSONE  5 mg Oral QHS  . sodium chloride flush  3 mL Intravenous Q12H  . thiamine  100 mg Oral Daily  . vitamin B-12  1,000 mcg Oral Daily  . vitamin C  500 mg Oral Daily   Continuous Infusions: . sodium chloride    . cefTRIAXone (ROCEPHIN)  IV 1 g (06/01/19 2106)  . sodium chloride irrigation 0 mL (05/31/19 1516)     LOS: 1 day    Time spent: 25 minutes    Barb Merino, MD [facility] Pager [phone]

## 2019-06-03 ENCOUNTER — Encounter: Payer: Medicare Other | Admitting: Thoracic Surgery (Cardiothoracic Vascular Surgery)

## 2019-06-03 LAB — BASIC METABOLIC PANEL
Anion gap: 8 (ref 5–15)
BUN: 42 mg/dL — ABNORMAL HIGH (ref 8–23)
CO2: 22 mmol/L (ref 22–32)
Calcium: 8.3 mg/dL — ABNORMAL LOW (ref 8.9–10.3)
Chloride: 109 mmol/L (ref 98–111)
Creatinine, Ser: 2.32 mg/dL — ABNORMAL HIGH (ref 0.61–1.24)
GFR calc Af Amer: 30 mL/min — ABNORMAL LOW (ref >60)
GFR calc non Af Amer: 26 mL/min — ABNORMAL LOW (ref >60)
Glucose, Bld: 139 mg/dL — ABNORMAL HIGH (ref 70–99)
Potassium: 4.5 mmol/L (ref 3.5–5.1)
Sodium: 139 mmol/L (ref 135–145)

## 2019-06-03 LAB — HEMOGLOBIN AND HEMATOCRIT, BLOOD
HCT: 25.3 % — ABNORMAL LOW (ref 39.0–52.0)
HCT: 24.2 % — ABNORMAL LOW (ref 39.0–52.0)
Hemoglobin: 7.7 g/dL — ABNORMAL LOW (ref 13.0–17.0)
Hemoglobin: 7.6 g/dL — ABNORMAL LOW (ref 13.0–17.0)

## 2019-06-03 LAB — URINE CULTURE: Culture: 20000 — AB

## 2019-06-03 NOTE — Evaluation (Signed)
Occupational Therapy Evaluation Patient Details Name: Victor Davis MRN: [id number] DOB: 10/19/1940 Today's Date: 06/03/2019    History of Present Illness 78 year old, prostate cancer castrate resistant, CKD stage IV, coronary artery disease presents to the hospital with sudden onset gross hematuria and blood loss anemia.   Clinical Impression   Pt admitted with the above diagnoses and presents with below problem list. Pt will benefit from continued acute OT to address the below listed deficits and maximize independence with basic ADLs prior to d/c home with spouse. PTA pt was mod I with ADLs. Pt is currently min guard with LB ADLs, functional mobility and toilet transfers, setup for UB ADLs.      Follow Up Recommendations  Home health OT;Supervision/Assistance - 24 hour    Equipment Recommendations  None recommended by OT    Recommendations for Other Services       Precautions / Restrictions Precautions Precautions: Fall Restrictions Weight Bearing Restrictions: No      Mobility Bed Mobility Overal bed mobility: Needs Assistance Bed Mobility: Supine to Sit     Supine to sit: HOB elevated;Supervision     General bed mobility comments: supervision for safety  Transfers Overall transfer level: Needs assistance Equipment used: Rolling walker (2 wheeled) Transfers: Sit to/from Stand Sit to Stand: Min guard         General transfer comment: Impulsive with standing. Stood from bed and BSC    Balance Overall balance assessment: History of Falls;Needs assistance   Sitting balance-Leahy Scale: Good     Standing balance support: During functional activity Standing balance-Leahy Scale: Fair Standing balance comment: single extremity support for static standing                           ADL either performed or assessed with clinical judgement   ADL Overall ADL's : Needs assistance/impaired Eating/Feeding: Set up;Sitting   Grooming: Oral care;Min  guard;Set up;Sitting;Standing Grooming Details (indicate cue type and reason): brushed teeth in standing position with external support of sinl Upper Body Bathing: Set up;Sitting   Lower Body Bathing: Min guard;Sit to/from stand   Upper Body Dressing : Set up;Sitting   Lower Body Dressing: Min guard;Sit to/from stand   Toilet Transfer: Min guard;Ambulation;BSC;RW   Toileting- Water quality scientist and Hygiene: Min guard;Sit to/from stand   Tub/ Shower Transfer: Min guard;Ambulation;3 in 1;Rolling walker;Walk-in shower   Functional mobility during ADLs: Min guard;Rolling walker General ADL Comments: Pt completed toilet transfer to [facility] in the room, pericare, then walked to sink and completed oral care and hand washing in standing. Recliner positioned behind pt at the sink and after grooming tasks he sat in recliner.      Vision         Perception     Praxis      Pertinent Vitals/Pain Pain Assessment: No/denies pain     Hand Dominance     Extremity/Trunk Assessment Upper Extremity Assessment Upper Extremity Assessment: Generalized weakness   Lower Extremity Assessment Lower Extremity Assessment: Defer to PT evaluation       Communication Communication Communication: No difficulties   Cognition Arousal/Alertness: Awake/alert Behavior During Therapy: WFL for tasks assessed/performed Overall Cognitive Status: No family/caregiver present to determine baseline cognitive functioning                                 General Comments: Oriented to person and place. Tangential responses at  times.   General Comments       Exercises     Shoulder Instructions      Home Living Family/patient expects to be discharged to:: Private residence Living Arrangements: Spouse/significant other Available Help at Discharge: Family Type of Home: House Home Access: Stairs to enter Technical brewer of Steps: 3 Entrance Stairs-Rails: Right Home Layout: One  level     Bathroom Shower/Tub: Tub/shower unit         Home Equipment: [place] - single point;Walker - 2 wheels          Prior Functioning/Environment Level of Independence: Independent with assistive device(s)        Comments: Amb with cane; outside help needed for assist with house cleaning. Reports his spouse handles meal prep.        OT Problem List: Decreased strength;Decreased activity tolerance;Impaired balance (sitting and/or standing);Decreased knowledge of use of DME or AE;Decreased knowledge of precautions      OT Treatment/Interventions: Self-care/ADL training;Therapeutic exercise;Energy conservation;DME and/or AE instruction;Therapeutic activities;Patient/family education;Balance training    OT Goals(Current goals can be found in the care plan section) Acute Rehab OT Goals Patient Stated Goal: home with spouse OT Goal Formulation: With patient Time For Goal Achievement: 06/17/19 Potential to Achieve Goals: Good ADL Goals Pt Will Perform Lower Body Bathing: with modified independence;sit to/from stand Pt Will Perform Lower Body Dressing: with modified independence;sit to/from stand Pt Will Transfer to Toilet: with modified independence;ambulating Pt Will Perform Toileting - Clothing Manipulation and hygiene: with modified independence;sit to/from stand  OT Frequency: Min 2X/week   Barriers to D/C:            Co-evaluation              AM-PAC OT "6 Clicks" Daily Activity     Outcome Measure Help from another person eating meals?: None Help from another person taking care of personal grooming?: None Help from another person toileting, which includes using toliet, bedpan, or urinal?: A Little Help from another person bathing (including washing, rinsing, drying)?: A Little Help from another person to put on and taking off regular upper body clothing?: None Help from another person to put on and taking off regular lower body clothing?: A Little 6 Click  Score: 21   End of Session Equipment Utilized During Treatment: Gait belt;Rolling walker  Activity Tolerance: Patient tolerated treatment well Patient left: in chair;with call bell/phone within reach;with chair alarm set  OT Visit Diagnosis: Unsteadiness on feet (R26.81);History of falling (Z91.81);Muscle weakness (generalized) (M62.81)                Time: 3893-7342 OT Time Calculation (min): 25 min Charges:  OT General Charges $OT Visit: 1 Visit OT Evaluation $OT Eval Low Complexity: 1 Low OT Treatments $Self Care/Home Management : 8-22 mins  Tyrone Schimke, OT Acute Rehabilitation Services Pager: [phone] Office: [phone]   Hortencia Pilar 06/03/2019, 12:00 PM

## 2019-06-03 NOTE — TOC Progression Note (Signed)
Transition of Care [facility]) - Progression Note    Patient Details  Name: Victor Davis MRN: [id number] Date of Birth: 06-24-1941  [facility] CM/SW Contact  Xolani Degracia, Juliann Pulse, RN Phone Number: 06/03/2019, 2:32 PM  Clinical Narrative: Alvis Lemmings HHPT-rep Tommi Rumps following.           Expected Discharge Plan and Services                                                 Social Determinants of Health (SDOH) Interventions    Readmission Risk Interventions No flowsheet data found.

## 2019-06-03 NOTE — Progress Notes (Signed)
PROGRESS NOTE    Victor Davis  QQI:[id number] DOB: 1941/05/03 DOA: 05/31/2019 PCP: Biagio Borg, MD    Brief Narrative:  78 year old, prostate cancer castrate resistant, CKD stage IV, coronary artery disease presented to the hospital with sudden onset gross hematuria and blood loss anemia.  Patient was started on three-way Foley catheter and CBI and admitted to the hospital.  Followed by urology. Patient has history of hypertension.  Baseline creatinine is about 2.7-2.8.  Patient refused to be on aspirin and Plavix, his Plavix was stopped about 3 weeks ago.  Was still on aspirin when presented to the hospital.  Received total 3 units of PRBC to date.    Assessment & Plan:   Active Problems:   PROSTATE CANCER, HX OF   CAD (coronary artery disease)   Hypertension   Acute lower UTI   Dementia (HCC)   CKD (chronic kidney disease), stage III (HCC)   Gross hematuria   Acute blood loss anemia  Gross hematuria: Due to prostate cancer with history of radiation.  Treated with CBI.  Hematuria clearing.  Remains on minimum amount of CBI.  Followed by urology.  Symptomatic treatment with blood transfusions, maintenance IV fluids. Empiric antibiotics.  Urine cultures with no significant growth.  We will continue antibiotics when running CBI. Further management as per urology. On chronic maintenance prednisone for prostrate cancer.  Acute blood loss anemia in the setting of anemia of chronic disease: Received total 3 units of PRBC.  Hemoglobin is 7.7.  Continue blood transfusion to keep hemoglobin more than 7.  Check every 12 hours to ensure stabilization.  Acute kidney injury on chronic kidney disease stage IV: Renal functions fluctuate. Continues to improve.  No urinary obstruction.  We will continue monitoring.  At about his usual levels.  Hypertension: On hydralazine and amlodipine that he will continue.  History of coronary artery disease: Used to be on Plavix and aspirin.  Plavix was  discontinued sometime ago as per reports, aspirin on hold.  No active coronary artery disease.  Will resume aspirin when hematuria improves.  Patient is on hydralazine and nitrates.  He is on pravastatin.  Dementia: With no behavior problems.  On Aricept and Celexa.  Continue.  DVT prophylaxis: SCDs Code Status: Full code Family Communication: Wife, [name] on phone. Disposition Plan: Remains inpatient, anticipate home with home therapies tomorrow if adequate clinical improvement.   Consultants:   Urology  Procedures:   CBI  Antimicrobials:   Rocephin, 05/31/2019---   Subjective: Seen and examined.  No overnight events.  Wife on the phone.  Urine is clear.  He has some discomfort along the Foley catheter, however denies any abdominal pain, suprapubic pain fever or nausea. Objective: Vitals:   06/02/19 0336 06/02/19 1258 06/02/19 2013 06/03/19 0516  BP: (!) 122/58 124/60 (!) 143/65 140/61  Pulse: 66 68 70 65  Resp: 18 18 18 16   Temp: 98 F (36.7 C) 98 F (36.7 C) 99.1 F (37.3 C) 97.7 F (36.5 C)  TempSrc: Oral Oral Oral Oral  SpO2: 92% 96% 97% 97%  Weight:      Height:        Intake/Output Summary (Last 24 hours) at 06/03/2019 0932 Last data filed at 06/02/2019 2015 Gross per 24 hour  Intake 1120 ml  Output 1100 ml  Net 20 ml   Filed Weights   06/01/19 0500 06/02/19 0329  Weight: 68 kg 58.1 kg    Examination:  General exam: Appears calm and comfortable, chronically sick  looking.   Respiratory system: Clear to auscultation. Respiratory effort normal. Cardiovascular system: S1 & S2 heard, RRR. No JVD, murmurs, rubs, gallops or clicks. No pedal edema. Gastrointestinal system: Abdomen is nondistended, soft and nontender. No organomegaly or masses felt. Normal bowel sounds heard. Foley catheter with clear urine. Minimal CBI running.  Central nervous system: Alert and oriented. No focal neurological deficits. Extremities: Symmetric 5 x 5 power. Skin: No rashes,  lesions or ulcers Psychiatry: Judgement and insight appear normal. Mood & affect appropriate.     Data Reviewed: I have personally reviewed following labs and imaging studies  CBC: Recent Labs  Lab 05/31/19 1302 05/31/19 2353 06/01/19 0519 06/01/19 1637 06/02/19 0428 06/02/19 1713 06/03/19 0458  WBC 8.1 10.9* 6.8  --   --   --   --   HGB 6.7* 7.6* 6.4* 6.8* 8.0* 8.7* 7.7*  HCT 22.6* 24.4* 20.6* 22.0* 25.7* 27.1* 25.3*  MCV 102.7* 96.8 98.6  --   --   --   --   PLT 153 155 121*  --   --   --   --    Basic Metabolic Panel: Recent Labs  Lab 05/31/19 1302 06/01/19 0519 06/02/19 0428 06/03/19 0458  NA 139 140 138 139  K 4.6 5.6* 4.4 4.5  CL 109 111 110 109  CO2 22 21* 21* 22  GLUCOSE 134* 119* 99 139*  BUN 45* 49* 51* 42*  CREATININE 2.46* 2.83* 2.69* 2.32*  CALCIUM 8.9 8.5* 8.3* 8.3*   GFR: Estimated Creatinine Clearance: 21.2 mL/min (A) (by C-G formula based on SCr of 2.32 mg/dL (H)). Liver Function Tests: Recent Labs  Lab 05/31/19 1307 06/01/19 0519  AST 14* 13*  ALT 8 7  ALKPHOS 63 58  BILITOT 1.1 1.0  PROT 5.7* 4.8*  ALBUMIN 3.0* 2.6*   No results for input(s): LIPASE, AMYLASE in the last 168 hours. Recent Labs  Lab 05/31/19 1330  AMMONIA 15   Coagulation Profile: No results for input(s): INR, PROTIME in the last 168 hours. Cardiac Enzymes: No results for input(s): CKTOTAL, CKMB, CKMBINDEX, TROPONINI in the last 168 hours. BNP (last 3 results) No results for input(s): PROBNP in the last 8760 hours. HbA1C: No results for input(s): HGBA1C in the last 72 hours. CBG: No results for input(s): GLUCAP in the last 168 hours. Lipid Profile: No results for input(s): CHOL, HDL, LDLCALC, TRIG, CHOLHDL, LDLDIRECT in the last 72 hours. Thyroid Function Tests: No results for input(s): TSH, T4TOTAL, FREET4, T3FREE, THYROIDAB in the last 72 hours. Anemia Panel: No results for input(s): VITAMINB12, FOLATE, FERRITIN, TIBC, IRON, RETICCTPCT in the last 72  hours. Sepsis Labs: No results for input(s): PROCALCITON, LATICACIDVEN in the last 168 hours.  Recent Results (from the past 240 hour(s))  Urine C&S     Status: Abnormal   Collection Time: 05/31/19 10:50 AM   Specimen: Urine, Clean Catch  Result Value Ref Range Status   Specimen Description   Final    URINE, CLEAN CATCH Performed at [facility], [place] [address] [place], [place] [address]    Special Requests   Final    NONE Performed at [facility], [place] [address] [place], [place] [address]    Culture 20,000 COLONIES/mL PROTEUS MIRABILIS (A)  Final   Report Status 06/03/2019 FINAL  Final   Organism ID, Bacteria PROTEUS MIRABILIS (A)  Final      Susceptibility   Proteus mirabilis - MIC*    AMPICILLIN <=2 SENSITIVE Sensitive  CEFAZOLIN <=4 SENSITIVE Sensitive     CEFTRIAXONE <=1 SENSITIVE Sensitive     CIPROFLOXACIN <=0.25 SENSITIVE Sensitive     GENTAMICIN <=1 SENSITIVE Sensitive     IMIPENEM 2 SENSITIVE Sensitive     NITROFURANTOIN 128 RESISTANT Resistant     TRIMETH/SULFA <=20 SENSITIVE Sensitive     AMPICILLIN/SULBACTAM <=2 SENSITIVE Sensitive     PIP/TAZO <=4 SENSITIVE Sensitive     * 20,000 COLONIES/mL PROTEUS MIRABILIS  SARS CORONAVIRUS 2 (TAT 6-24 HRS) Nasopharyngeal Nasopharyngeal Swab     Status: None   Collection Time: 05/31/19  3:18 PM   Specimen: Nasopharyngeal Swab  Result Value Ref Range Status   SARS Coronavirus 2 NEGATIVE NEGATIVE Final    Comment: (NOTE) SARS-CoV-2 target nucleic acids are NOT DETECTED. The SARS-CoV-2 RNA is generally detectable in upper and lower respiratory specimens during the acute phase of infection. Negative results do not preclude SARS-CoV-2 infection, do not rule out co-infections with other pathogens, and should not be used as the sole basis for treatment or other patient management decisions. Negative results must be combined with clinical observations, patient history, and  epidemiological information. The expected result is Negative. Fact Sheet for Patients: SugarRoll.be Fact Sheet for Healthcare Providers: https://www.woods-mathews.com/ This test is not yet approved or cleared by the [place] FDA and  has been authorized for detection and/or diagnosis of SARS-CoV-2 by FDA under an Emergency Use Authorization (EUA). This EUA will remain  in effect (meaning this test can be used) for the duration of the COVID-19 declaration under Section 56 4(b)(1) of the Act, 21 U.S.C. section 360bbb-3(b)(1), unless the authorization is terminated or revoked sooner. Performed at [facility], [place] [address], [place], [place] [address]          Radiology Studies: No results found.      Scheduled Meds: . sodium chloride   Intravenous Once  . amLODipine  5 mg Oral Daily  . Chlorhexidine Gluconate Cloth  6 each Topical Daily  . cholecalciferol  2,000 Units Oral Daily  . citalopram  20 mg Oral Daily  . donepezil  5 mg Oral QHS  . ferrous sulfate  325 mg Oral Daily  . gabapentin  300 mg Oral BID  . hydrALAZINE  25 mg Oral TID  . isosorbide mononitrate  30 mg Oral Daily  . ketotifen  1 drop Both Eyes BID  . pantoprazole  40 mg Oral Daily  . pravastatin  40 mg Oral q1800  . predniSONE  5 mg Oral QHS  . sodium chloride flush  3 mL Intravenous Q12H  . thiamine  100 mg Oral Daily  . vitamin B-12  1,000 mcg Oral Daily  . vitamin C  500 mg Oral Daily   Continuous Infusions: . sodium chloride    . cefTRIAXone (ROCEPHIN)  IV 1 g (06/02/19 2247)  . sodium chloride irrigation 0 mL (05/31/19 1516)     LOS: 2 days    Time spent: 25 minutes    Barb Merino, MD [facility] Pager [phone]

## 2019-06-04 DIAGNOSIS — F0150 Vascular dementia without behavioral disturbance: Secondary | ICD-10-CM

## 2019-06-04 DIAGNOSIS — I2510 Atherosclerotic heart disease of native coronary artery without angina pectoris: Secondary | ICD-10-CM

## 2019-06-04 LAB — HEMOGLOBIN AND HEMATOCRIT, BLOOD
HCT: 25 % — ABNORMAL LOW (ref 39.0–52.0)
Hemoglobin: 7.9 g/dL — ABNORMAL LOW (ref 13.0–17.0)

## 2019-06-04 LAB — BASIC METABOLIC PANEL
Anion gap: 8 (ref 5–15)
BUN: 38 mg/dL — ABNORMAL HIGH (ref 8–23)
CO2: 23 mmol/L (ref 22–32)
Calcium: 8.2 mg/dL — ABNORMAL LOW (ref 8.9–10.3)
Chloride: 108 mmol/L (ref 98–111)
Creatinine, Ser: 2.02 mg/dL — ABNORMAL HIGH (ref 0.61–1.24)
GFR calc Af Amer: 35 mL/min — ABNORMAL LOW (ref >60)
GFR calc non Af Amer: 30 mL/min — ABNORMAL LOW (ref >60)
Glucose, Bld: 114 mg/dL — ABNORMAL HIGH (ref 70–99)
Potassium: 4.2 mmol/L (ref 3.5–5.1)
Sodium: 139 mmol/L (ref 135–145)

## 2019-06-04 NOTE — Progress Notes (Signed)
This RN spoke with pt wife about patients possible d/c and d/c plans. Patients wife stated that they have a current bedbug infestation in their home and that she is unable to afford an exterminator. Pt wife also states that he has a walker at home but he is unable to use it d/t the fact that there house is too small.

## 2019-06-04 NOTE — Plan of Care (Signed)

## 2019-06-04 NOTE — Progress Notes (Signed)
Patient has continued to have adequate urine output throughout night shift without any signs of bleeding or clots. Will continue to monitor.

## 2019-06-04 NOTE — Progress Notes (Signed)
This RN spoke with patient about the possibility of needing rehab or SNF at d/c and pt was agreeable at this time. PT was called to evaluate patient.

## 2019-06-04 NOTE — Progress Notes (Signed)
[facility]                                                                              Patient Demographics  Victor Davis, is a 78 y.o. male, DOB - 02-14-1941, LOV:[id number]  Admit date - 05/31/2019   Admitting Physician Jani Gravel, MD  Outpatient Primary MD for the patient is Biagio Borg, MD  Outpatient specialists:   LOS - 3  days   Medical records reviewed and are as summarized below:    Chief Complaint  Patient presents with  . Dysuria  . Hematuria  . Pelvic Pain       Brief summary   78 year old, prostate cancer castrate resistant, CKD stage IV, coronary artery disease presented to the hospital with sudden onset gross hematuria and blood loss anemia.  Patient was started on three-way Foley catheter and CBI and admitted to the hospital.  Followed by urology. Patient has history of hypertension.  Baseline creatinine is about 2.7-2.8.  Patient refused to be on aspirin and Plavix, his Plavix was stopped about 3 weeks ago.  Was still on aspirin when presented to the hospital.  Received total 3 units of PRBC to date.    Assessment & Plan   Gross hematuria with history of prostate CA,  -Patient has been treated with CBI, currently cleared, CBI stopped -Followed by urology, if he fails may need cystoscopy with possibly TURP -Patient was placed on empiric antibiotics   History of nonmetastatic castrate resistant prostate CA -Urology aware, Dr. Tammi Klippel considering other options, patient follows Dr. Tresa Moore urology  Acute blood loss anemia in the setting of chronic anemia -Patient received total 3 units of packed RBCs, hemoglobin 7.9 -Transfuse for hemoglobin less than 7  Acute kidney injury on CKD stage IV -Recheck bmet  Essential hypertension -Continue hydralazine, amlodipine  History of CAD -Used to be on Plavix, aspirin.  Plavix was discontinued as per reports, aspirin on hold, no active chest pain or shortness of breath. -If no further  hematuria, may resume aspirin on discharge. -Continue statin, hydralazine, nitrates  History of coronary artery disease: Used to be on Plavix and aspirin.  Plavix was discontinued sometime ago as per reports, aspirin on hold.  No active coronary artery disease.  Will resume aspirin when hematuria improves.  Patient is on hydralazine and nitrates.  He is on pravastatin.  Dementia:  -No acute behavioral issues, continue Celexa, Aricept  Generalized debility - PT OT, recommended skilled nursing facility, 24-hour supervision.  Social work consulted  Code Status: Full code DVT Prophylaxis: SCDs Family Communication: Discussed all imaging results, lab results, explained to the patient    Disposition Plan: Needs skilled nursing facility  Time Spent in minutes 25 minutes  Procedures:  CBI  Consultants:   Urology  Antimicrobials:   Anti-infectives (From admission, onward)   Start     Dose/Rate Route Frequency Ordered Stop   05/31/19 2000  cefTRIAXone (ROCEPHIN) 1 g in sodium chloride 0.9 % 100 mL IVPB     1 g 200 mL/hr over 30 Minutes Intravenous Every 24 hours 05/31/19 1803  Medications  Scheduled Meds: . sodium chloride   Intravenous Once  . amLODipine  5 mg Oral Daily  . Chlorhexidine Gluconate Cloth  6 each Topical Daily  . cholecalciferol  2,000 Units Oral Daily  . citalopram  20 mg Oral Daily  . donepezil  5 mg Oral QHS  . ferrous sulfate  325 mg Oral Daily  . gabapentin  300 mg Oral BID  . hydrALAZINE  25 mg Oral TID  . isosorbide mononitrate  30 mg Oral Daily  . ketotifen  1 drop Both Eyes BID  . pantoprazole  40 mg Oral Daily  . pravastatin  40 mg Oral q1800  . predniSONE  5 mg Oral QHS  . sodium chloride flush  3 mL Intravenous Q12H  . thiamine  100 mg Oral Daily  . vitamin B-12  1,000 mcg Oral Daily  . vitamin C  500 mg Oral Daily   Continuous Infusions: . sodium chloride    . cefTRIAXone (ROCEPHIN)  IV 1 g (06/03/19 2222)   PRN  Meds:.sodium chloride, meclizine, ondansetron (ZOFRAN) IV, sodium chloride flush      Subjective:   Victor Davis was seen and examined today.  Somewhat confused, oriented to self,.  Difficult to obtain review of system from the patient.  No fevers or chills.  Denies any pain.  No chest pain, shortness of breath, abdominal pain, N/V.  No acute events reported overnight.  Objective:   Vitals:   06/03/19 2024 06/04/19 0624 06/04/19 1047 06/04/19 1237  BP: (!) 152/65 (!) 152/63 (!) 156/61 (!) 177/77  Pulse: 72 63  65  Resp: 16 17  19   Temp: 98.4 F (36.9 C) 97.7 F (36.5 C)  98 F (36.7 C)  TempSrc: Oral Oral  Oral  SpO2: 97% 98%  97%  Weight:      Height:        Intake/Output Summary (Last 24 hours) at 06/04/2019 1517 Last data filed at 06/04/2019 1239 Gross per 24 hour  Intake 1780 ml  Output 3301 ml  Net -1521 ml     Wt Readings from Last 3 Encounters:  06/02/19 58.1 kg  05/29/19 58.1 kg  05/28/19 58.3 kg     Exam  General: Alert and oriented x self, confused  Eyes:   HEENT:  Atraumatic, normocephalic  Cardiovascular: S1 S2 auscultated, no murmurs, RRR  Respiratory: Clear to auscultation bilaterally, no wheezing  Gastrointestinal: Soft, nontender, nondistended, + bowel sounds  Ext: no pedal edema bilaterally  Neuro: Moving all 4 extremities  Musculoskeletal: No digital cyanosis, clubbing  Skin: No rashes  Psych: Confused   Data Reviewed:  I have personally reviewed following labs and imaging studies  Micro Results Recent Results (from the past 240 hour(s))  Urine C&S     Status: Abnormal   Collection Time: 05/31/19 10:50 AM   Specimen: Urine, Clean Catch  Result Value Ref Range Status   Specimen Description   Final    URINE, CLEAN CATCH Performed at [facility], [place] [address] [place], [place] [address]    Special Requests   Final    NONE Performed at [facility], [place] [address]  [place], [place] [address]    Culture 20,000 COLONIES/mL PROTEUS MIRABILIS (A)  Final   Report Status 06/03/2019 FINAL  Final   Organism ID, Bacteria PROTEUS MIRABILIS (A)  Final      Susceptibility   Proteus mirabilis - MIC*    AMPICILLIN <=2 SENSITIVE Sensitive  CEFAZOLIN <=4 SENSITIVE Sensitive     CEFTRIAXONE <=1 SENSITIVE Sensitive     CIPROFLOXACIN <=0.25 SENSITIVE Sensitive     GENTAMICIN <=1 SENSITIVE Sensitive     IMIPENEM 2 SENSITIVE Sensitive     NITROFURANTOIN 128 RESISTANT Resistant     TRIMETH/SULFA <=20 SENSITIVE Sensitive     AMPICILLIN/SULBACTAM <=2 SENSITIVE Sensitive     PIP/TAZO <=4 SENSITIVE Sensitive     * 20,000 COLONIES/mL PROTEUS MIRABILIS  SARS CORONAVIRUS 2 (TAT 6-24 HRS) Nasopharyngeal Nasopharyngeal Swab     Status: None   Collection Time: 05/31/19  3:18 PM   Specimen: Nasopharyngeal Swab  Result Value Ref Range Status   SARS Coronavirus 2 NEGATIVE NEGATIVE Final    Comment: (NOTE) SARS-CoV-2 target nucleic acids are NOT DETECTED. The SARS-CoV-2 RNA is generally detectable in upper and lower respiratory specimens during the acute phase of infection. Negative results do not preclude SARS-CoV-2 infection, do not rule out co-infections with other pathogens, and should not be used as the sole basis for treatment or other patient management decisions. Negative results must be combined with clinical observations, patient history, and epidemiological information. The expected result is Negative. Fact Sheet for Patients: SugarRoll.be Fact Sheet for Healthcare Providers: https://www.woods-mathews.com/ This test is not yet approved or cleared by the [place] FDA and  has been authorized for detection and/or diagnosis of SARS-CoV-2 by FDA under an Emergency Use Authorization (EUA). This EUA will remain  in effect (meaning this test can be used) for the duration of the COVID-19 declaration under Section 56 4(b)(1) of  the Act, 21 U.S.C. section 360bbb-3(b)(1), unless the authorization is terminated or revoked sooner. Performed at [facility], [place] [address] [place], [place] [address]     Radiology Reports Dg Chest 2 View  Result Date: 05/10/2019 CLINICAL DATA:  Rule out pneumonia, weakness EXAM: CHEST - 2 VIEW COMPARISON:  02/25/2019 FINDINGS: The heart size and mediastinal contours are within normal limits. Coronary stents. Aortic atherosclerosis. Both lungs are clear. Redemonstrated postoperative findings of right lower lobe wedge resection. Disc degenerative disease of the thoracic spine. IMPRESSION: No acute abnormality of the lungs. Electronically Signed   By: Eddie Candle M.D.   On: 05/10/2019 13:55   Ct Head Wo Contrast  Result Date: 05/10/2019 CLINICAL DATA:  Weakness for 2 days.  Fall yesterday. EXAM: CT HEAD WITHOUT CONTRAST TECHNIQUE: Contiguous axial images were obtained from the base of the skull through the vertex without intravenous contrast. COMPARISON:  February 25, 2019 FINDINGS: Brain: No subdural, epidural, or subarachnoid hemorrhage. Chronic white matter changes are noted. 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 Infarcts are seen in the left cerebellar hemisphere, also stable. The cerebellum is otherwise unchanged. A lacunar infarct is seen within the pons, stable. The brainstem is otherwise normal. Basal cisterns are patent. Ventricles and sulci are prominent but stable. No mass effect or midline shift. No acute cortical ischemia is identified. Vascular: Calcified atherosclerosis is seen in the intracranial carotids. Skull: Normal. Negative for fracture or focal lesion. Sinuses/Orbits: No acute finding. Other: None. IMPRESSION: Chronic white matter changes and infarcts as above. No acute intracranial abnormalities identified. Electronically Signed   By: Dorise Bullion III M.D   On: 05/10/2019 13:52   Ct Chest Wo Contrast  Result Date: 05/23/2019 CLINICAL DATA:   Non-small-cell lung cancer. Restaging. EXAM: CT CHEST WITHOUT CONTRAST TECHNIQUE: Multidetector CT imaging of the chest was performed following the standard protocol without IV contrast. COMPARISON:  05/27/2018 FINDINGS: Cardiovascular:  The heart size is normal. No substantial pericardial effusion. Coronary artery calcification is evident. Atherosclerotic calcification is noted in the wall of the thoracic aorta. Mediastinum/Nodes: No mediastinal lymphadenopathy. No evidence for gross hilar lymphadenopathy although assessment is limited by the lack of intravenous contrast on today's study. The esophagus has normal imaging features. There is no axillary lymphadenopathy. Lungs/Pleura: Centrilobular and paraseptal emphysema evident. Patient is status post right lower lobe superior segmentectomy. 4 mm right middle lobe nodule (87/7) is unchanged. Subtle 6 mm ground-glass opacity in the left lower lobe (108/7) is unchanged in the interval. No focal airspace consolidation. No pleural effusion. Upper Abdomen: Calcified gallstones again noted. Diverticular changes are seen in the abdominal segments of the colon. There is abdominal aortic atherosclerosis. Musculoskeletal: No worrisome lytic or sclerotic osseous abnormality. IMPRESSION: 1. Stable exam. No new or progressive findings to suggest recurrent or metastatic disease in the chest. 2. Emphysema. 3. Coronary artery and thoracoabdominal aortic atherosclerosis. 4. Cholelithiasis. Aortic Atherosclerosis (ICD10-I70.0) and Emphysema (ICD10-J43.9). Electronically Signed   By: Misty Stanley M.D.   On: 05/23/2019 13:10   US Pelvis Limited (transabdominal Only)  Result Date: 05/31/2019 CLINICAL DATA:  Hematuria.  Scan for clots. EXAM: LIMITED ULTRASOUND OF PELVIS TECHNIQUE: Limited transabdominal ultrasound examination of the pelvis was performed. COMPARISON:  None. FINDINGS: The study was suboptimal as the patient began vomiting in the study was ended early. A Foley  catheter is identified. The bladder wall appears markedly thickened. No anechoic urine is seen between the bladder wall and the catheter balloon. There appears to be some heterogeneous material around the Foley catheter. IMPRESSION: The study is limited as it had to be ended early due to patient condition. There is marked bladder wall thickening. There is heterogeneous material around the Foley catheter which could represent blood products given history. A bladder mass cannot be confidently excluded on this study. Electronically Signed   By: Dorise Bullion III M.D   On: 05/31/2019 15:27   Ct Renal Stone Study  Result Date: 05/10/2019 CLINICAL DATA:  Weakness for 2 days.  Fall.  Hematuria. EXAM: CT ABDOMEN AND PELVIS WITHOUT CONTRAST TECHNIQUE: Multidetector CT imaging of the abdomen and pelvis was performed following the standard protocol without IV contrast. COMPARISON:  Multiple CT scans since January 17, 2015 FINDINGS: Lower chest: No acute abnormality. Hepatobiliary: The liver is unremarkable. Cholelithiasis is identified without wall thickening. Pancreas: Unremarkable. No pancreatic ductal dilatation or surrounding inflammatory changes. Spleen: Normal in size without focal abnormality. Adrenals/Urinary Tract: A 2 mm stone is seen in the left kidney on coronal image 66. Bilateral renal cysts are noted. There is an unusual contour to the inferior left kidney. This was described as a complex cyst on a previous study from June of 2016. This lesion demonstrates an attenuation of 31 Hounsfield units today but is much smaller when compared to June of 2016 suggesting a benign etiology. Mild hydronephrosis and ureterectasis on the right. The distal right ureter is obscured by streak artifact off hip replacements. The left ureter is normal in caliber. No hydronephrosis on the left. No perinephric stranding adjacent to either kidney. Visualized portions of the bladder are normal. Stomach/Bowel: The stomach and small  bowel are normal. A portion of the sigmoid colon is obscured by streak artifact off the hip replacements. Colonic diverticulosis is identified, particularly in the descending and sigmoid colon. The remainder of the colon is normal. No evidence of appendicitis. Vascular/Lymphatic: Dense atherosclerotic changes are seen in the nonaneurysmal aorta, iliac vessels, and femoral  vessels. No adenopathy. Reproductive: Previous prostatectomy. Prostate bed is obscured by streak artifact. Other: No abdominal wall hernia or abnormality. No abdominopelvic ascites. Musculoskeletal: Bilateral hip replacements are identified. Bones are stable since April 01, 2019. IMPRESSION: 1. There is mild right hydronephrosis and ureterectasis. The distal most right ureter is obscured by streak artifact off hip replacements. The findings could represent either a stone in the distal right ureter or a recently passed stone as the finding is new when compared April 01, 2019. 2. There is an unusual contour to the inferior left kidney. A complex lesion was seen in this region on previous studies. While the finding is nonspecific on on today's study, the fact that the complex lesion seen in 2016 is significantly smaller today suggests a benign etiology. 3. 2 mm stone in the left kidney. 4. Colonic diverticulosis without diverticulitis. Atherosclerotic changes in the nonaneurysmal aorta and branching vessels. Electronically Signed   By: Dorise Bullion III M.D   On: 05/10/2019 17:19    Lab Data:  CBC: Recent Labs  Lab 05/31/19 1302 05/31/19 2353 06/01/19 4193  06/02/19 7902 06/02/19 1713 06/03/19 0458 06/03/19 1651 06/04/19 0510  WBC 8.1 10.9* 6.8  --   --   --   --   --   --   HGB 6.7* 7.6* 6.4*   < > 8.0* 8.7* 7.7* 7.6* 7.9*  HCT 22.6* 24.4* 20.6*   < > 25.7* 27.1* 25.3* 24.2* 25.0*  MCV 102.7* 96.8 98.6  --   --   --   --   --   --   PLT 153 155 121*  --   --   --   --   --   --    < > = values in this interval not displayed.    Basic Metabolic Panel: Recent Labs  Lab 05/31/19 1302 06/01/19 0519 06/02/19 0428 06/03/19 0458 06/04/19 0510  NA 139 140 138 139 139  K 4.6 5.6* 4.4 4.5 4.2  CL 109 111 110 109 108  CO2 22 21* 21* 22 23  GLUCOSE 134* 119* 99 139* 114*  BUN 45* 49* 51* 42* 38*  CREATININE 2.46* 2.83* 2.69* 2.32* 2.02*  CALCIUM 8.9 8.5* 8.3* 8.3* 8.2*   GFR: Estimated Creatinine Clearance: 24.4 mL/min (A) (by C-G formula based on SCr of 2.02 mg/dL (H)). Liver Function Tests: Recent Labs  Lab 05/31/19 1307 06/01/19 0519  AST 14* 13*  ALT 8 7  ALKPHOS 63 58  BILITOT 1.1 1.0  PROT 5.7* 4.8*  ALBUMIN 3.0* 2.6*   No results for input(s): LIPASE, AMYLASE in the last 168 hours. Recent Labs  Lab 05/31/19 1330  AMMONIA 15   Coagulation Profile: No results for input(s): INR, PROTIME in the last 168 hours. Cardiac Enzymes: No results for input(s): CKTOTAL, CKMB, CKMBINDEX, TROPONINI in the last 168 hours. BNP (last 3 results) No results for input(s): PROBNP in the last 8760 hours. HbA1C: No results for input(s): HGBA1C in the last 72 hours. CBG: No results for input(s): GLUCAP in the last 168 hours. Lipid Profile: No results for input(s): CHOL, HDL, LDLCALC, TRIG, CHOLHDL, LDLDIRECT in the last 72 hours. Thyroid Function Tests: No results for input(s): TSH, T4TOTAL, FREET4, T3FREE, THYROIDAB in the last 72 hours. Anemia Panel: No results for input(s): VITAMINB12, FOLATE, FERRITIN, TIBC, IRON, RETICCTPCT in the last 72 hours. Urine analysis:    Component Value Date/Time   COLORURINE STRAW (A) 05/31/2019 1050   APPEARANCEUR CLOUDY (A) 05/31/2019 1050   LABSPEC  1.018 05/31/2019 1050   PHURINE 8.0 05/31/2019 1050   GLUCOSEU 50 (A) 05/31/2019 1050   GLUCOSEU NEGATIVE 12/25/2018 1043   HGBUR LARGE (A) 05/31/2019 1050   BILIRUBINUR NEGATIVE 05/31/2019 1050   KETONESUR NEGATIVE 05/31/2019 1050   PROTEINUR >=300 (A) 05/31/2019 1050   UROBILINOGEN 0.2 12/25/2018 1043   NITRITE  NEGATIVE 05/31/2019 1050   LEUKOCYTESUR NEGATIVE 05/31/2019 1050     Kanin Lia M.D. [facility] 06/04/2019, 3:17 PM  Pager: [phone] Between 7am to 7pm - call Pager - [phone]  After 7pm go to [email or web address] - password [email or web address]  Call night coverage person covering after 7pm

## 2019-06-04 NOTE — Progress Notes (Signed)
Physical Therapy Treatment Patient Details Name: Victor Davis MRN: [id number] DOB: 22-Feb-1941 Today's Date: 06/04/2019    History of Present Illness 78 year old, prostate cancer castrate resistant, CKD stage IV, coronary artery disease presents to the hospital with sudden onset gross hematuria and blood loss anemia.    PT Comments    Patient remains confused and slightly impulsive with transfers and will require supervision for all mobility. Patient is progressing mobility gradually and was able to ambulate ~100 feet with RW. He continues to require support with RW to maintain balance and verbal/tactile cues were required for safe technique to transfer with walker and to maintain safe hand placement and proximity to RW. Min assist required to safely negotiate obstacles with RW while ambulating in hallway. He is unable to use his RW at home due to how narrow the halls and doorways are and currently requires UE support to prevent falls with static and dynamic standing as well as to ambulate. Pt will benefit from continued skilled PT interventions to address current impairments (see PT Problem List) and updating discharge recommendation to SNF as he is unable to use RW at home. Acute PT will follow and progress as able.   Follow Up Recommendations  SNF;Supervision for mobility/OOB     Equipment Recommendations  None recommended by PT    Recommendations for Other Services       Precautions / Restrictions Precautions Precautions: Fall Restrictions Weight Bearing Restrictions: No    Mobility  Bed Mobility Overal bed mobility: Needs Assistance Bed Mobility: Supine to Sit     Supine to sit: HOB elevated;Min assist     General bed mobility comments: pt required increased cues for sequencing bed mob today and assist to raise trunk, tactile cues required for pt to bring LE's around to EOB  Transfers Overall transfer level: Needs assistance Equipment used: Rolling walker (2  wheeled) Transfers: Sit to/from [facility] Sit to Stand: Min guard Stand pivot transfers: Min assist       General transfer comment: pt requries cues for safety with hand placement and technique for use of RW. pt remains slightly impulsive with trasnfers. cues required for safety with stand pivot in bathroom to negotiate obstacles using RW.  Ambulation/Gait Ambulation/Gait assistance: Min assist Gait Distance (Feet): 100 Feet Assistive device: Rolling walker (2 wheeled) Gait Pattern/deviations: Step-through pattern;Decreased stride length;Drifts right/left Gait velocity: decreased   General Gait Details: pt progressed distance with ambulation today and continues to require cues for safety with use of RW. verbal cues for safe hand placement throughout and assist to negotiate obstacles in hallway.   Stairs             Wheelchair Mobility    Modified Rankin (Stroke Patients Only)       Balance Overall balance assessment: History of Falls;Needs assistance Sitting-balance support: Feet supported;No upper extremity supported Sitting balance-Leahy Scale: Good     Standing balance support: During functional activity;Bilateral upper extremity supported;Single extremity supported Standing balance-Leahy Scale: Poor Standing balance comment: pt requries support for standing                            Cognition Arousal/Alertness: Awake/alert Behavior During Therapy: WFL for tasks assessed/performed Overall Cognitive Status: No family/caregiver present to determine baseline cognitive functioning Area of Impairment: Orientation                 Orientation Level: Disoriented to;Place;Time;Situation  Exercises      General Comments        Pertinent Vitals/Pain Pain Assessment: 0-10 Pain Score: 3  Pain Location: abdomen Pain Descriptors / Indicators: Aching Pain Intervention(s): Limited activity within  patient's tolerance;Monitored during session           PT Goals (current goals can now be found in the care plan section) Acute Rehab PT Goals Patient Stated Goal: home with spouse PT Goal Formulation: With patient Time For Goal Achievement: 06/16/19 Potential to Achieve Goals: Good Progress towards PT goals: Progressing toward goals    Frequency    Min 3X/week      PT Plan Discharge plan needs to be updated       AM-PAC PT "6 Clicks" Mobility   Outcome Measure  Help needed turning from your back to your side while in a flat bed without using bedrails?: A Little Help needed moving from lying on your back to sitting on the side of a flat bed without using bedrails?: A Little Help needed moving to and from a bed to a chair (including a wheelchair)?: A Little Help needed standing up from a chair using your arms (e.g., wheelchair or bedside chair)?: A Little Help needed to walk in hospital room?: A Little Help needed climbing 3-5 steps with a railing? : A Little 6 Click Score: 18    End of Session Equipment Utilized During Treatment: Gait belt Activity Tolerance: Patient tolerated treatment well Patient left: with call bell/phone within reach;in bed;with bed alarm set Nurse Communication: Mobility status PT Visit Diagnosis: Unsteadiness on feet (R26.81);Difficulty in walking, not elsewhere classified (R26.2);Repeated falls (R29.6)     Time: 5947-0761 PT Time Calculation (min) (ACUTE ONLY): 24 min  Charges:  $Gait Training: 8-22 mins $Therapeutic Activity: 8-22 mins                     Kipp Brood, PT, DPT Physical Therapist with [facility]  06/04/2019 2:43 PM

## 2019-06-04 NOTE — Care Management Important Message (Signed)
Important Message  Patient Details IM Letter given to Dessa Phi RN to present to the Patient Name: Victor Davis MRN: [id number] Date of Birth: [birth year]   Medicare Important Message Given:  Yes     Kerin Salen 06/04/2019, 10:59 AM

## 2019-06-05 LAB — BASIC METABOLIC PANEL
Anion gap: 10 (ref 5–15)
BUN: 40 mg/dL — ABNORMAL HIGH (ref 8–23)
CO2: 22 mmol/L (ref 22–32)
Calcium: 8.4 mg/dL — ABNORMAL LOW (ref 8.9–10.3)
Chloride: 107 mmol/L (ref 98–111)
Creatinine, Ser: 1.9 mg/dL — ABNORMAL HIGH (ref 0.61–1.24)
GFR calc Af Amer: 38 mL/min — ABNORMAL LOW (ref >60)
GFR calc non Af Amer: 33 mL/min — ABNORMAL LOW (ref >60)
Glucose, Bld: 106 mg/dL — ABNORMAL HIGH (ref 70–99)
Potassium: 4.2 mmol/L (ref 3.5–5.1)
Sodium: 139 mmol/L (ref 135–145)

## 2019-06-05 LAB — CBC
HCT: 26.3 % — ABNORMAL LOW (ref 39.0–52.0)
Hemoglobin: 8.2 g/dL — ABNORMAL LOW (ref 13.0–17.0)
MCH: 29.8 pg (ref 26.0–34.0)
MCHC: 31.2 g/dL (ref 30.0–36.0)
MCV: 95.6 fL (ref 80.0–100.0)
Platelets: 122 10*3/uL — ABNORMAL LOW (ref 150–400)
RBC: 2.75 MIL/uL — ABNORMAL LOW (ref 4.22–5.81)
RDW: 14.6 % (ref 11.5–15.5)
WBC: 4.9 10*3/uL (ref 4.0–10.5)
nRBC: 0 % (ref 0.0–0.2)

## 2019-06-05 LAB — SARS CORONAVIRUS 2 (TAT 6-24 HRS): SARS Coronavirus 2: NEGATIVE

## 2019-06-05 NOTE — Progress Notes (Signed)
[facility]                                                                              Patient Demographics  Victor Davis, is a 78 y.o. male, DOB - 1940-09-14, GYJ:[id number]  Admit date - 05/31/2019   Admitting Physician Jani Gravel, MD  Outpatient Primary MD for the patient is Biagio Borg, MD  Outpatient specialists:   LOS - 4  days   Medical records reviewed and are as summarized below:    Chief Complaint  Patient presents with  . Dysuria  . Hematuria  . Pelvic Pain       Brief summary   78 year old, prostate cancer castrate resistant, CKD stage IV, coronary artery disease presented to the hospital with sudden onset gross hematuria and blood loss anemia.  Patient was started on three-way Foley catheter and CBI and admitted to the hospital.  Followed by urology. Patient has history of hypertension.  Baseline creatinine is about 2.7-2.8.  Patient refused to be on aspirin and Plavix, his Plavix was stopped about 3 weeks ago.  Was still on aspirin when presented to the hospital.  Received total 3 units of PRBC to date.    Assessment & Plan   Gross hematuria with history of prostate CA,  -Resolved, patient was treated with CBI.  CBI has been stopped now. -Patient was followed by urology, recommended cystoscopy if he fails a CBI -Urine culture had shown 20,000 colonies of Proteus mirabilis on 10/17, DC antibiotics tomorrow, day #7   History of nonmetastatic castrate resistant prostate CA -Urology aware, Dr. Tammi Klippel considering other options, patient follows Dr. Tresa Moore urology  Acute blood loss anemia in the setting of chronic anemia -Patient received total 3 units of packed RBCs through the hospitalization.  Hemoglobin stable.  8.2 today -Transfuse for hemoglobin less than 7  Acute kidney injury on CKD stage IV -Improving, patient presented with creatinine of 2.8 -Creatinine improving to 1.9 today.  Essential hypertension -Continue hydralazine,  amlodipine  History of CAD -Used to be on Plavix, aspirin.  Plavix was discontinued as per reports, aspirin on hold, no active chest pain or shortness of breath. -If no further hematuria, may resume aspirin on discharge. -Continue statin, hydralazine, nitrates  History of coronary artery disease: Used to be on Plavix and aspirin.  Plavix was discontinued sometime ago as per reports, aspirin on hold.  No active coronary artery disease.  Will resume aspirin when hematuria improves.  Patient is on hydralazine and nitrates.  He is on pravastatin.  Dementia:  -No acute behavioral issues, pleasantly confused, oriented to self.  - Continue Celexa, Aricept  Generalized debility - PT OT, recommended skilled nursing facility.  Social work consulted.  Code Status: Full code DVT Prophylaxis: SCDs Family Communication: Discussed all imaging results, lab results, explained to the patient    Disposition Plan: Patient needs skilled nursing facility, discussed with CM.  Covid test ordered.  Time Spent in minutes 25 minutes  Procedures:  CBI  Consultants:   Urology  Antimicrobials:   Anti-infectives (From admission, onward)   Start     Dose/Rate Route Frequency Ordered Stop  05/31/19 2000  cefTRIAXone (ROCEPHIN) 1 g in sodium chloride 0.9 % 100 mL IVPB     1 g 200 mL/hr over 30 Minutes Intravenous Every 24 hours 05/31/19 1803           Medications  Scheduled Meds: . amLODipine  5 mg Oral Daily  . Chlorhexidine Gluconate Cloth  6 each Topical Daily  . cholecalciferol  2,000 Units Oral Daily  . citalopram  20 mg Oral Daily  . donepezil  5 mg Oral QHS  . ferrous sulfate  325 mg Oral Daily  . gabapentin  300 mg Oral BID  . hydrALAZINE  25 mg Oral TID  . isosorbide mononitrate  30 mg Oral Daily  . ketotifen  1 drop Both Eyes BID  . pantoprazole  40 mg Oral Daily  . pravastatin  40 mg Oral q1800  . predniSONE  5 mg Oral QHS  . sodium chloride flush  3 mL Intravenous Q12H  .  thiamine  100 mg Oral Daily  . vitamin B-12  1,000 mcg Oral Daily  . vitamin C  500 mg Oral Daily   Continuous Infusions: . sodium chloride    . cefTRIAXone (ROCEPHIN)  IV 1 g (06/04/19 2158)   PRN Meds:.sodium chloride, meclizine, ondansetron (ZOFRAN) IV, sodium chloride flush      Subjective:   Victor Davis was seen and examined today.  Very confused, oriented to self.  Denies any pain.  No fevers or chills.   No chest pain, shortness of breath, abdominal pain, N/V.  No acute events reported overnight.  Objective:   Vitals:   06/04/19 1237 06/04/19 2016 06/05/19 0504 06/05/19 1335  BP: (!) 177/77 (!) 167/75 (!) 144/66 (!) 141/61  Pulse: 65 70 65 63  Resp: 19 20 18 18   Temp: 98 F (36.7 C) 97.8 F (36.6 C) 97.9 F (36.6 C) 98.5 F (36.9 C)  TempSrc: Oral Oral Oral Oral  SpO2: 97% 98% 97% 99%  Weight:      Height:        Intake/Output Summary (Last 24 hours) at 06/05/2019 1339 Last data filed at 06/05/2019 0501 Gross per 24 hour  Intake 220 ml  Output 1060 ml  Net -840 ml     Wt Readings from Last 3 Encounters:  06/02/19 58.1 kg  05/29/19 58.1 kg  05/28/19 58.3 kg    Physical Exam  General: Alert and oriented x self, dementia, pleasantly confused  Eyes:   HEENT:  Atraumatic,   Cardiovascular: S1 S2 clear, RRR. No pedal edema b/l  Respiratory: CTAB, no wheezing, rales or rhonchi  Gastrointestinal: Soft, nontender, nondistended, NBS  Ext: no pedal edema bilaterally  Neuro: no new deficits  Musculoskeletal: No cyanosis, clubbing  Skin: No rashes  Psych: Pleasantly confused, dementia   Data Reviewed:  I have personally reviewed following labs and imaging studies  Micro Results Recent Results (from the past 240 hour(s))  Urine C&S     Status: Abnormal   Collection Time: 05/31/19 10:50 AM   Specimen: Urine, Clean Catch  Result Value Ref Range Status   Specimen Description   Final    URINE, CLEAN CATCH Performed at [facility] [address], [place], [place] [address]    Special Requests   Final    NONE Performed at [facility], [place] [address], [place], [place] [address]    Culture 20,000 COLONIES/mL PROTEUS MIRABILIS (A)  Final   Report Status 06/03/2019 FINAL  Final  Organism ID, Bacteria PROTEUS MIRABILIS (A)  Final      Susceptibility   Proteus mirabilis - MIC*    AMPICILLIN <=2 SENSITIVE Sensitive     CEFAZOLIN <=4 SENSITIVE Sensitive     CEFTRIAXONE <=1 SENSITIVE Sensitive     CIPROFLOXACIN <=0.25 SENSITIVE Sensitive     GENTAMICIN <=1 SENSITIVE Sensitive     IMIPENEM 2 SENSITIVE Sensitive     NITROFURANTOIN 128 RESISTANT Resistant     TRIMETH/SULFA <=20 SENSITIVE Sensitive     AMPICILLIN/SULBACTAM <=2 SENSITIVE Sensitive     PIP/TAZO <=4 SENSITIVE Sensitive     * 20,000 COLONIES/mL PROTEUS MIRABILIS  SARS CORONAVIRUS 2 (TAT 6-24 HRS) Nasopharyngeal Nasopharyngeal Swab     Status: None   Collection Time: 05/31/19  3:18 PM   Specimen: Nasopharyngeal Swab  Result Value Ref Range Status   SARS Coronavirus 2 NEGATIVE NEGATIVE Final    Comment: (NOTE) SARS-CoV-2 target nucleic acids are NOT DETECTED. The SARS-CoV-2 RNA is generally detectable in upper and lower respiratory specimens during the acute phase of infection. Negative results do not preclude SARS-CoV-2 infection, do not rule out co-infections with other pathogens, and should not be used as the sole basis for treatment or other patient management decisions. Negative results must be combined with clinical observations, patient history, and epidemiological information. The expected result is Negative. Fact Sheet for Patients: SugarRoll.be Fact Sheet for Healthcare Providers: https://www.woods-mathews.com/ This test is not yet approved or cleared by the [place] FDA and  has been authorized for detection and/or diagnosis of SARS-CoV-2 by FDA under an Emergency  Use Authorization (EUA). This EUA will remain  in effect (meaning this test can be used) for the duration of the COVID-19 declaration under Section 56 4(b)(1) of the Act, 21 U.S.C. section 360bbb-3(b)(1), unless the authorization is terminated or revoked sooner. Performed at [facility], [place] [address], [place], [place] [address]     Radiology Reports Dg Chest 2 View  Result Date: 05/10/2019 CLINICAL DATA:  Rule out pneumonia, weakness EXAM: CHEST - 2 VIEW COMPARISON:  02/25/2019 FINDINGS: The heart size and mediastinal contours are within normal limits. Coronary stents. Aortic atherosclerosis. Both lungs are clear. Redemonstrated postoperative findings of right lower lobe wedge resection. Disc degenerative disease of the thoracic spine. IMPRESSION: No acute abnormality of the lungs. Electronically Signed   By: Eddie Candle M.D.   On: 05/10/2019 13:55   Ct Head Wo Contrast  Result Date: 05/10/2019 CLINICAL DATA:  Weakness for 2 days.  Fall yesterday. EXAM: CT HEAD WITHOUT CONTRAST TECHNIQUE: Contiguous axial images were obtained from the base of the skull through the vertex without intravenous contrast. COMPARISON:  February 25, 2019 FINDINGS: Brain: No subdural, epidural, or subarachnoid hemorrhage. Chronic white matter changes are noted. 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 Infarcts are seen in the left cerebellar hemisphere, also stable. The cerebellum is otherwise unchanged. A lacunar infarct is seen within the pons, stable. The brainstem is otherwise normal. Basal cisterns are patent. Ventricles and sulci are prominent but stable. No mass effect or midline shift. No acute cortical ischemia is identified. Vascular: Calcified atherosclerosis is seen in the intracranial carotids. Skull: Normal. Negative for fracture or focal lesion. Sinuses/Orbits: No acute finding. Other: None. IMPRESSION: Chronic white matter changes and infarcts as above. No acute intracranial  abnormalities identified. Electronically Signed   By: Dorise Bullion III M.D   On: 05/10/2019 13:52   Ct Chest Wo Contrast  Result Date: 05/23/2019 CLINICAL DATA:  Non-small-cell lung cancer. Restaging. EXAM: CT CHEST WITHOUT CONTRAST TECHNIQUE: Multidetector CT imaging of the chest was performed following the standard protocol without IV contrast. COMPARISON:  05/27/2018 FINDINGS: Cardiovascular: The heart size is normal. No substantial pericardial effusion. Coronary artery calcification is evident. Atherosclerotic calcification is noted in the wall of the thoracic aorta. Mediastinum/Nodes: No mediastinal lymphadenopathy. No evidence for gross hilar lymphadenopathy although assessment is limited by the lack of intravenous contrast on today's study. The esophagus has normal imaging features. There is no axillary lymphadenopathy. Lungs/Pleura: Centrilobular and paraseptal emphysema evident. Patient is status post right lower lobe superior segmentectomy. 4 mm right middle lobe nodule (87/7) is unchanged. Subtle 6 mm ground-glass opacity in the left lower lobe (108/7) is unchanged in the interval. No focal airspace consolidation. No pleural effusion. Upper Abdomen: Calcified gallstones again noted. Diverticular changes are seen in the abdominal segments of the colon. There is abdominal aortic atherosclerosis. Musculoskeletal: No worrisome lytic or sclerotic osseous abnormality. IMPRESSION: 1. Stable exam. No new or progressive findings to suggest recurrent or metastatic disease in the chest. 2. Emphysema. 3. Coronary artery and thoracoabdominal aortic atherosclerosis. 4. Cholelithiasis. Aortic Atherosclerosis (ICD10-I70.0) and Emphysema (ICD10-J43.9). Electronically Signed   By: Misty Stanley M.D.   On: 05/23/2019 13:10   US Pelvis Limited (transabdominal Only)  Result Date: 05/31/2019 CLINICAL DATA:  Hematuria.  Scan for clots. EXAM: LIMITED ULTRASOUND OF PELVIS TECHNIQUE: Limited transabdominal ultrasound  examination of the pelvis was performed. COMPARISON:  None. FINDINGS: The study was suboptimal as the patient began vomiting in the study was ended early. A Foley catheter is identified. The bladder wall appears markedly thickened. No anechoic urine is seen between the bladder wall and the catheter balloon. There appears to be some heterogeneous material around the Foley catheter. IMPRESSION: The study is limited as it had to be ended early due to patient condition. There is marked bladder wall thickening. There is heterogeneous material around the Foley catheter which could represent blood products given history. A bladder mass cannot be confidently excluded on this study. Electronically Signed   By: Dorise Bullion III M.D   On: 05/31/2019 15:27   Ct Renal Stone Study  Result Date: 05/10/2019 CLINICAL DATA:  Weakness for 2 days.  Fall.  Hematuria. EXAM: CT ABDOMEN AND PELVIS WITHOUT CONTRAST TECHNIQUE: Multidetector CT imaging of the abdomen and pelvis was performed following the standard protocol without IV contrast. COMPARISON:  Multiple CT scans since January 17, 2015 FINDINGS: Lower chest: No acute abnormality. Hepatobiliary: The liver is unremarkable. Cholelithiasis is identified without wall thickening. Pancreas: Unremarkable. No pancreatic ductal dilatation or surrounding inflammatory changes. Spleen: Normal in size without focal abnormality. Adrenals/Urinary Tract: A 2 mm stone is seen in the left kidney on coronal image 66. Bilateral renal cysts are noted. There is an unusual contour to the inferior left kidney. This was described as a complex cyst on a previous study from June of 2016. This lesion demonstrates an attenuation of 31 Hounsfield units today but is much smaller when compared to June of 2016 suggesting a benign etiology. Mild hydronephrosis and ureterectasis on the right. The distal right ureter is obscured by streak artifact off hip replacements. The left ureter is normal in caliber. No  hydronephrosis on the left. No perinephric stranding adjacent to either kidney. Visualized portions of the bladder are normal. Stomach/Bowel: The stomach and small bowel are normal. A portion of the sigmoid colon is obscured by streak artifact off the hip replacements. Colonic diverticulosis is identified, particularly in  the descending and sigmoid colon. The remainder of the colon is normal. No evidence of appendicitis. Vascular/Lymphatic: Dense atherosclerotic changes are seen in the nonaneurysmal aorta, iliac vessels, and femoral vessels. No adenopathy. Reproductive: Previous prostatectomy. Prostate bed is obscured by streak artifact. Other: No abdominal wall hernia or abnormality. No abdominopelvic ascites. Musculoskeletal: Bilateral hip replacements are identified. Bones are stable since April 01, 2019. IMPRESSION: 1. There is mild right hydronephrosis and ureterectasis. The distal most right ureter is obscured by streak artifact off hip replacements. The findings could represent either a stone in the distal right ureter or a recently passed stone as the finding is new when compared April 01, 2019. 2. There is an unusual contour to the inferior left kidney. A complex lesion was seen in this region on previous studies. While the finding is nonspecific on on today's study, the fact that the complex lesion seen in 2016 is significantly smaller today suggests a benign etiology. 3. 2 mm stone in the left kidney. 4. Colonic diverticulosis without diverticulitis. Atherosclerotic changes in the nonaneurysmal aorta and branching vessels. Electronically Signed   By: Dorise Bullion III M.D   On: 05/10/2019 17:19    Lab Data:  CBC: Recent Labs  Lab 05/31/19 1302 05/31/19 2353 06/01/19 7062  06/02/19 1713 06/03/19 0458 06/03/19 1651 06/04/19 0510 06/05/19 0457  WBC 8.1 10.9* 6.8  --   --   --   --   --  4.9  HGB 6.7* 7.6* 6.4*   < > 8.7* 7.7* 7.6* 7.9* 8.2*  HCT 22.6* 24.4* 20.6*   < > 27.1* 25.3* 24.2*  25.0* 26.3*  MCV 102.7* 96.8 98.6  --   --   --   --   --  95.6  PLT 153 155 121*  --   --   --   --   --  122*   < > = values in this interval not displayed.   Basic Metabolic Panel: Recent Labs  Lab 06/01/19 0519 06/02/19 0428 06/03/19 0458 06/04/19 0510 06/05/19 0457  NA 140 138 139 139 139  K 5.6* 4.4 4.5 4.2 4.2  CL 111 110 109 108 107  CO2 21* 21* 22 23 22   GLUCOSE 119* 99 139* 114* 106*  BUN 49* 51* 42* 38* 40*  CREATININE 2.83* 2.69* 2.32* 2.02* 1.90*  CALCIUM 8.5* 8.3* 8.3* 8.2* 8.4*   GFR: Estimated Creatinine Clearance: 25.9 mL/min (A) (by C-G formula based on SCr of 1.9 mg/dL (H)). Liver Function Tests: Recent Labs  Lab 05/31/19 1307 06/01/19 0519  AST 14* 13*  ALT 8 7  ALKPHOS 63 58  BILITOT 1.1 1.0  PROT 5.7* 4.8*  ALBUMIN 3.0* 2.6*   No results for input(s): LIPASE, AMYLASE in the last 168 hours. Recent Labs  Lab 05/31/19 1330  AMMONIA 15   Coagulation Profile: No results for input(s): INR, PROTIME in the last 168 hours. Cardiac Enzymes: No results for input(s): CKTOTAL, CKMB, CKMBINDEX, TROPONINI in the last 168 hours. BNP (last 3 results) No results for input(s): PROBNP in the last 8760 hours. HbA1C: No results for input(s): HGBA1C in the last 72 hours. CBG: No results for input(s): GLUCAP in the last 168 hours. Lipid Profile: No results for input(s): CHOL, HDL, LDLCALC, TRIG, CHOLHDL, LDLDIRECT in the last 72 hours. Thyroid Function Tests: No results for input(s): TSH, T4TOTAL, FREET4, T3FREE, THYROIDAB in the last 72 hours. Anemia Panel: No results for input(s): VITAMINB12, FOLATE, FERRITIN, TIBC, IRON, RETICCTPCT in the last 72 hours. Urine  analysis:    Component Value Date/Time   COLORURINE STRAW (A) 05/31/2019 1050   APPEARANCEUR CLOUDY (A) 05/31/2019 1050   LABSPEC 1.018 05/31/2019 1050   PHURINE 8.0 05/31/2019 1050   GLUCOSEU 50 (A) 05/31/2019 1050   GLUCOSEU NEGATIVE 12/25/2018 1043   HGBUR LARGE (A) 05/31/2019 1050    BILIRUBINUR NEGATIVE 05/31/2019 1050   KETONESUR NEGATIVE 05/31/2019 1050   PROTEINUR >=300 (A) 05/31/2019 1050   UROBILINOGEN 0.2 12/25/2018 1043   NITRITE NEGATIVE 05/31/2019 1050   LEUKOCYTESUR NEGATIVE 05/31/2019 1050     Cortasia Screws M.D. [facility] 06/05/2019, 1:39 PM  Pager: [phone] Between 7am to 7pm - call Pager - [phone]  After 7pm go to [email or web address] - password [email or web address]  Call night coverage person covering after 7pm

## 2019-06-05 NOTE — TOC Initial Note (Signed)
Transition of Care [facility]) - Initial/Assessment Note    Patient Details  Name: Victor Davis MRN: [id number] Date of Birth: [birth year]  [facility] CM/SW Contact:    Dessa Phi, RN Phone Number: 06/05/2019, 11:11 AM  Clinical Narrative: Patient has dementia.PT recc SNF-spouse agreed to faxing out to SNF-[facility] is the preferred has been in the past. Await bed offers. Will need covid within 48hrs of d/c. MD notified.                 Expected Discharge Plan: Skilled Nursing Facility Barriers to Discharge: Continued Medical Work up   Patient Goals and CMS Choice Patient states their goals for this hospitalization and ongoing recovery are:: go to rehab CMS Medicare.gov Compare Post Acute Care list provided to:: Patient Represenative (must comment) Choice offered to / list presented to : Spouse  Expected Discharge Plan and Services Expected Discharge Plan: [place]   Discharge Planning Services: CM Consult   Living arrangements for the past 2 months: Single Family Home                                      Prior Living Arrangements/Services Living arrangements for the past 2 months: Single Family Home Lives with:: Spouse Patient language and need for interpreter reviewed:: Yes Do you feel safe going back to the place where you live?: Yes      Need for Family Participation in Patient Care: No (Comment) Care giver support system in place?: Yes (comment)   Criminal Activity/Legal Involvement Pertinent to Current Situation/Hospitalization: No - Comment as needed  Activities of Daily Living Home Assistive Devices/Equipment: Walker (specify type) ADL Screening (condition at time of admission) Patient's cognitive ability adequate to safely complete daily activities?: No Is the patient deaf or have difficulty hearing?: No Does the patient have difficulty seeing, even when wearing glasses/contacts?: No Does the patient have difficulty  concentrating, remembering, or making decisions?: No Patient able to express need for assistance with ADLs?: Yes Does the patient have difficulty dressing or bathing?: No Independently performs ADLs?: Yes (appropriate for developmental age) Does the patient have difficulty walking or climbing stairs?: Yes Weakness of Legs: Left Weakness of Arms/Hands: None  Permission Sought/Granted Permission sought to share information with : Case Manager Permission granted to share information with : Yes, Verbal Permission Granted  Share Information with NAMEJuliann Pulse RNCM [phone]  Permission granted to share info w AGENCY: SNF  Permission granted to share info w Relationship: spouse Ethelene Hal     Emotional Assessment Appearance:: Appears stated age Attitude/Demeanor/Rapport: Gracious Affect (typically observed): Accepting Orientation: : Oriented to Self Alcohol / Substance Use: Not Applicable Psych Involvement: No (comment)  Admission diagnosis:  Acute blood loss anemia [D62] Gross hematuria [R31.0] Patient Active Problem List   Diagnosis Date Noted  . Gross hematuria 05/31/2019  . Acute blood loss anemia 05/31/2019  . Failure to thrive in adult 05/10/2019  . Allergic conjunctivitis 02/24/2019  . Gait disorder 12/24/2018  . Weight loss 12/24/2018  . Thrombocytopenia ([place]) 05/28/2018  . Arm bruise 04/01/2018  . Carotid stenosis 01/28/2018  . Symptomatic anemia 11/26/2017  . Depression 06/20/2017  . Bradycardia/Falls 06/08/2017  . Head injury without skull fracture/S/p Fall with Lt Frontal Laceration 06/08/2017  . Sinus bradycardia 06/08/2017  . B12 deficiency 04/11/2017  . CKD (chronic kidney disease), stage III ([place]) 02/04/2016  . Snoring 01/21/2016  .  Dementia ([place]) 10/08/2015  . Multiple bruises 10/08/2015  . Non-small cell carcinoma of right lung, stage 1 ([place]) 04/29/2015  . Lung nodule 03/22/2015  . Cholecystostomy drain infection ([place]) 01/25/2015  . Abdominal  pain 01/18/2015  . Cholecystitis 01/18/2015  . Acute on chronic kidney failure ([place]) 01/18/2015  . Right upper quadrant pain   . Acute cholecystitis   . Renal cyst 12/31/2012  . Hearing loss 07/05/2012  . Lesion of nose 07/05/2012  . Anemia, unspecified 04/02/2012  . Pulmonary emphysema ([place]) 04/01/2012  . Hypertension 03/09/2012  . Acute lower UTI 03/09/2012  . Pulmonary nodule, right 02/08/2012  . Mitral regurgitation 02/06/2012  . CAD (coronary artery disease) 01/04/2012  . Unspecified disorder of liver 12/18/2011  . Hyperthyroidism 12/18/2011  . Nonspecific abnormal findings on radiological and examination of lung field 12/18/2011  . Impaired glucose tolerance 02/09/2011  . Encounter for well adult exam with abnormal findings 02/09/2011  . INSOMNIA-SLEEP DISORDER-UNSPEC 10/22/2009  . PERIPHERAL EDEMA 10/22/2009  . Hyperlipidemia 07/30/2008  . Peripheral vascular disease ([place]) 07/30/2008  . 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 Unspecified visual loss 07/15/2008  . SHOULDER PAIN, LEFT 01/22/2008  . Hypersomnia 10/02/2007  . RESTLESS LEG SYNDROME 05/15/2007  . RHINITIS, ALLERGIC NOS 05/15/2007  . ERECTILE DYSFUNCTION 04/05/2007  . PEPTIC ULCER DISEASE 04/05/2007  . Osteoarthritis 04/05/2007  . LOW BACK PAIN 04/05/2007  . PROSTATE CANCER, HX OF 04/05/2007   PCP:  Biagio Borg, MD Pharmacy:   [id number] [place], [place] [address] [place] [address] Phone: [phone] Fax: [phone]     Social Determinants of Health (SDOH) Interventions    Readmission Risk Interventions No flowsheet data found.

## 2019-06-05 NOTE — NC FL2 (Signed)
[place] MEDICAID FL2 LEVEL OF CARE SCREENING TOOL     IDENTIFICATION  Patient Name: Victor Davis Birthdate: Mar 28, 1941 Sex: male Admission Date (Current Location): 05/31/2019  [facility] and [place] Number:  Herbalist and Address:  [facility],  [employer], [place]      Provider Number: [id number]  Attending Physician Name and Address:  Mendel Corning, MD  Relative Name and Phone Number:  Laquincy Eastridge [phone]    Current Level of Care: Hospital Recommended Level of Care: [place] Prior Approval Number:    Date Approved/Denied:   PASRR Number: [id number]  Discharge Plan: SNF    Current Diagnoses: Patient Active Problem List   Diagnosis Date Noted  . Gross hematuria 05/31/2019  . Acute blood loss anemia 05/31/2019  . Failure to thrive in adult 05/10/2019  . Allergic conjunctivitis 02/24/2019  . Gait disorder 12/24/2018  . Weight loss 12/24/2018  . Thrombocytopenia ([place]) 05/28/2018  . Arm bruise 04/01/2018  . Carotid stenosis 01/28/2018  . Symptomatic anemia 11/26/2017  . Depression 06/20/2017  . Bradycardia/Falls 06/08/2017  . Head injury without skull fracture/S/p Fall with Lt Frontal Laceration 06/08/2017  . Sinus bradycardia 06/08/2017  . B12 deficiency 04/11/2017  . CKD (chronic kidney disease), stage III ([place]) 02/04/2016  . Snoring 01/21/2016  . Dementia ([place]) 10/08/2015  . Multiple bruises 10/08/2015  . Non-small cell carcinoma of right lung, stage 1 ([place]) 04/29/2015  . Lung nodule 03/22/2015  . Cholecystostomy drain infection ([place]) 01/25/2015  . Abdominal pain 01/18/2015  . Cholecystitis 01/18/2015  . Acute on chronic kidney failure ([place]) 01/18/2015  . Right upper quadrant pain   . Acute cholecystitis   . Renal cyst 12/31/2012  . Hearing loss 07/05/2012  . Lesion of nose 07/05/2012  . Anemia, unspecified 04/02/2012  . Pulmonary emphysema ([place]) 04/01/2012  . Hypertension 03/09/2012  .  Acute lower UTI 03/09/2012  . Pulmonary nodule, right 02/08/2012  . Mitral regurgitation 02/06/2012  . CAD (coronary artery disease) 01/04/2012  . Unspecified disorder of liver 12/18/2011  . Hyperthyroidism 12/18/2011  . Nonspecific abnormal findings on radiological and examination of lung field 12/18/2011  . Impaired glucose tolerance 02/09/2011  . Encounter for well adult exam with abnormal findings 02/09/2011  . INSOMNIA-SLEEP DISORDER-UNSPEC 10/22/2009  . PERIPHERAL EDEMA 10/22/2009  . Hyperlipidemia 07/30/2008  . Peripheral vascular disease ([place]) 07/30/2008  . 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 Unspecified visual loss 07/15/2008  . SHOULDER PAIN, LEFT 01/22/2008  . Hypersomnia 10/02/2007  . RESTLESS LEG SYNDROME 05/15/2007  . RHINITIS, ALLERGIC NOS 05/15/2007  . ERECTILE DYSFUNCTION 04/05/2007  . PEPTIC ULCER DISEASE 04/05/2007  . Osteoarthritis 04/05/2007  . LOW BACK PAIN 04/05/2007  . PROSTATE CANCER, HX OF 04/05/2007    Orientation RESPIRATION BLADDER Height & Weight     Self, Time  Normal Continent Weight: 58.1 kg Height:  5\' 9"  (175.3 cm)  BEHAVIORAL SYMPTOMS/MOOD NEUROLOGICAL BOWEL NUTRITION STATUS      Continent Diet(Heart Healthy)  AMBULATORY STATUS COMMUNICATION OF NEEDS Skin   Limited Assist Verbally Normal                       Personal Care Assistance Level of Assistance  Bathing, Feeding, Dressing Bathing Assistance: Limited assistance Feeding assistance: Limited assistance Dressing Assistance: Limited assistance     Functional Limitations Info  Sight, Hearing, Speech Sight Info: Adequate Hearing Info: Adequate Speech Info: Adequate    SPECIAL CARE FACTORS FREQUENCY  PT (  By licensed PT), OT (By licensed OT)     PT Frequency: 5x week OT Frequency: 5x week            Contractures Contractures Info: Not present    Additional Factors Info  Code Status, Allergies Code Status Info: Full code Allergies Info: Amitriptyline Hcl,  Diphenhydramine Hcl, Simvastatin, Clonidine Derivatives, Tylenol Acetaminophen           Current Medications (06/05/2019):  This is the current hospital active medication list Current Facility-Administered Medications  Medication Dose Route Frequency Provider Last Rate Last Dose  . 0.9 %  sodium chloride infusion (Manually program via Guardrails IV Fluids)   Intravenous Once Dorie Rank, MD      . 0.9 %  sodium chloride infusion  250 mL Intravenous PRN Jani Gravel, MD      . amLODipine (NORVASC) tablet 5 mg  5 mg Oral Daily Jani Gravel, MD   5 mg at 06/05/19 1027  . cefTRIAXone (ROCEPHIN) 1 g in sodium chloride 0.9 % 100 mL IVPB  1 g Intravenous Q24H Jani Gravel, MD 200 mL/hr at 06/04/19 2158 1 g at 06/04/19 2158  . Chlorhexidine Gluconate Cloth 2 % PADS 6 each  6 each Topical Daily Jani Gravel, MD   6 each at 06/05/19 1036  . cholecalciferol (VITAMIN D) tablet 2,000 Units  2,000 Units Oral Daily Jani Gravel, MD   2,000 Units at 06/05/19 1028  . citalopram (CELEXA) tablet 20 mg  20 mg Oral Daily Jani Gravel, MD   20 mg at 06/05/19 1027  . donepezil (ARICEPT) tablet 5 mg  5 mg Oral QHS Jani Gravel, MD   5 mg at 06/04/19 2123  . ferrous sulfate tablet 325 mg  325 mg Oral Daily Jani Gravel, MD   325 mg at 06/05/19 1027  . gabapentin (NEURONTIN) capsule 300 mg  300 mg Oral BID Jani Gravel, MD   300 mg at 06/05/19 1028  . hydrALAZINE (APRESOLINE) tablet 25 mg  25 mg Oral TID Jani Gravel, MD   25 mg at 06/05/19 1028  . isosorbide mononitrate (IMDUR) 24 hr tablet 30 mg  30 mg Oral Daily Jani Gravel, MD   30 mg at 06/05/19 1027  . ketotifen (ZADITOR) 0.025 % ophthalmic solution 1 drop  1 drop Both Eyes BID Jani Gravel, MD   1 drop at 06/05/19 1028  . meclizine (ANTIVERT) tablet 12.5-25 mg  12.5-25 mg Oral TID PRN Jani Gravel, MD      . ondansetron [facility]) injection 4 mg  4 mg Intravenous Q6H PRN Jani Gravel, MD   4 mg at 05/31/19 1606  . pantoprazole (PROTONIX) EC tablet 40 mg  40 mg Oral Daily Jani Gravel, MD   40  mg at 06/05/19 1028  . pravastatin (PRAVACHOL) tablet 40 mg  40 mg Oral q1800 Jani Gravel, MD   40 mg at 06/04/19 1727  . predniSONE (DELTASONE) tablet 5 mg  5 mg Oral QHS Jani Gravel, MD   5 mg at 06/04/19 2123  . sodium chloride flush (NS) 0.9 % injection 3 mL  3 mL Intravenous Q12H Jani Gravel, MD   3 mL at 06/05/19 1029  . sodium chloride flush (NS) 0.9 % injection 3 mL  3 mL Intravenous PRN Jani Gravel, MD      . thiamine (VITAMIN B-1) tablet 100 mg  100 mg Oral Daily Jani Gravel, MD   100 mg at 06/05/19 1027  . vitamin B-12 (CYANOCOBALAMIN) tablet 1,000 mcg  1,000 mcg Oral  Daily Jani Gravel, MD   1,000 mcg at 06/05/19 1028  . vitamin C (ASCORBIC ACID) tablet 500 mg  500 mg Oral Daily Jani Gravel, MD   500 mg at 06/05/19 1027     Discharge Medications: Please see discharge summary for a list of discharge medications.  Relevant Imaging Results:  Relevant Lab Results:   Additional Information SS# [id number] [place]n P[place], [place]

## 2019-06-05 NOTE — TOC Progression Note (Signed)
Transition of Care [facility]) - Progression Note    Patient Details  Name: Victor Davis MRN: [id number] Date of Birth: 06/18/1941  [facility] CM/SW Contact  Andora Krull, [place]n Pulse, RN Phone Number: 06/05/2019, 2:03 PM  Clinical Narrative:  [facility]ene Kelp chosen-facility rep has started [place]. covid ordered.     Expected Discharge Plan: [place] Barriers to Discharge: Continued Medical Work up  Expected Discharge Plan and Services Expected Discharge Plan: [place]   Discharge Planning Services: CM Consult   Living arrangements for the past 2 months: Single Family Home                                       Social Determinants of Health (SDOH) Interventions    Readmission Risk Interventions No flowsheet data found.

## 2019-06-06 DIAGNOSIS — I10 Essential (primary) hypertension: Secondary | ICD-10-CM

## 2019-06-06 LAB — CBC
HCT: 25.6 % — ABNORMAL LOW (ref 39.0–52.0)
Hemoglobin: 7.8 g/dL — ABNORMAL LOW (ref 13.0–17.0)
MCH: 29.3 pg (ref 26.0–34.0)
MCHC: 30.5 g/dL (ref 30.0–36.0)
MCV: 96.2 fL (ref 80.0–100.0)
Platelets: 120 10*3/uL — ABNORMAL LOW (ref 150–400)
RBC: 2.66 MIL/uL — ABNORMAL LOW (ref 4.22–5.81)
RDW: 14.4 % (ref 11.5–15.5)
WBC: 5 10*3/uL (ref 4.0–10.5)
nRBC: 0 % (ref 0.0–0.2)

## 2019-06-06 LAB — BASIC METABOLIC PANEL
Anion gap: 8 (ref 5–15)
BUN: 37 mg/dL — ABNORMAL HIGH (ref 8–23)
CO2: 23 mmol/L (ref 22–32)
Calcium: 8.2 mg/dL — ABNORMAL LOW (ref 8.9–10.3)
Chloride: 109 mmol/L (ref 98–111)
Creatinine, Ser: 1.91 mg/dL — ABNORMAL HIGH (ref 0.61–1.24)
GFR calc Af Amer: 38 mL/min — ABNORMAL LOW (ref >60)
GFR calc non Af Amer: 33 mL/min — ABNORMAL LOW (ref >60)
Glucose, Bld: 108 mg/dL — ABNORMAL HIGH (ref 70–99)
Potassium: 4.3 mmol/L (ref 3.5–5.1)
Sodium: 140 mmol/L (ref 135–145)

## 2019-06-06 NOTE — Consult Note (Signed)
   [facility] CM Inpatient Consult   06/06/2019  Victor Davis 08/20/1940 [id number]   Patient screened for potential [facility] Care Management services due to unplanned readmission risk score of 34%, extreme.  Chart review reveals current disposition plan is for SNF. No [facility] CM services at this time.  Netta Cedars, MSN, [facility] Liaison Nurse Mobile Phone [phone]  Toll free office [phone]

## 2019-06-06 NOTE — TOC Progression Note (Signed)
Transition of Care [facility]) - Progression Note    Patient Details  Name: PHARELL ROLFSON MRN: [id number] Date of Birth: 22-Sep-1940  Transition of Care [facility]) CM/SW Contact  Romana Deaton, Juliann Pulse, RN Phone Number: 06/06/2019, 12:11 PM  Clinical Narrative: Faxed note about no bed bugs in hospital since admission. Helene Kelp has started [place] for SNF.     Expected Discharge Plan: Skilled Nursing Facility Barriers to Discharge: Insurance Authorization  Expected Discharge Plan and Services Expected Discharge Plan: [place]   Discharge Planning Services: CM Consult   Living arrangements for the past 2 months: Single Family Home                                       Social Determinants of Health (SDOH) Interventions    Readmission Risk Interventions No flowsheet data found.

## 2019-06-06 NOTE — Progress Notes (Signed)
PT Cancellation Note  Patient Details Name: Victor Davis MRN: [id number] DOB: 12/10/1940   Cancelled Treatment:    Reason Eval/Treat Not Completed: Patient declined citing stomach ache.  He was polite and requested PT come back another time. Will check back as schedule permits.   Galen Manila 06/06/2019, 11:30 AM

## 2019-06-06 NOTE — Progress Notes (Addendum)
[facility]                                                                              Patient Demographics  Victor Davis, is a 78 y.o. male, DOB - 08-Jan-1941, OZY:[id number]  Admit date - 05/31/2019   Admitting Physician Jani Gravel, MD  Outpatient Primary MD for the patient is Biagio Borg, MD  Outpatient specialists:   LOS - 5  days   Medical records reviewed and are as summarized below:    Chief Complaint  Patient presents with  . Dysuria  . Hematuria  . Pelvic Pain       Brief summary   78 year old, prostate cancer castrate resistant, CKD stage IV, coronary artery disease presented to the hospital with sudden onset gross hematuria and blood loss anemia.  Patient was started on three-way Foley catheter and CBI and admitted to the hospital.  Followed by urology. Patient has history of hypertension.  Baseline creatinine is about 2.7-2.8.  Patient refused to be on aspirin and Plavix, his Plavix was stopped about 3 weeks ago.  Was still on aspirin when presented to the hospital.  Received total 3 units of PRBC to date.    Assessment & Plan   Gross hematuria with history of prostate CA,  -Resolved, patient was treated with CBI.  CBI has been stopped now. -Patient was followed by urology, recommended cystoscopy if he fails a CBI -Urine culture had shown 20,000 colonies of Proteus mirabilis on 10/17 -Unfortunately hematuria restarted again, will restart CBI.   -  Reviewed prior urology note by Dr. Junious Silk on 10/18 had recommended cystoscopy and TURP with possible TURBT if patient fails.  Discussed with urology, Dr. Tresa Moore, will evaluate patient, n.p.o. after midnight.  History of nonmetastatic castrate resistant prostate CA -Urology aware, Dr. Tammi Klippel considering other options, patient follows Dr. Tresa Moore urology.  [facility] today.  Acute blood loss anemia in the setting of chronic anemia -Patient received total 3 units of packed RBCs through the  hospitalization.   - Hemoglobin 7.8, will transfuse for hemoglobin less than 7  Acute kidney injury on CKD stage IV -Improving, patient presented with creatinine of 2.8 Creatinine plateaued at 1.9  Essential hypertension -BP stable, continue hydralazine, amlodipine  History of CAD -Used to be on Plavix, aspirin.  Plavix was discontinued as per reports, aspirin on hold, no active chest pain or shortness of breath. - will continue to hold aspirin due to recurrent hematuria.   -Continue statin, hydralazine, nitrates  History of coronary artery disease: Used to be on Plavix and aspirin.  Plavix was discontinued sometime ago as per reports, aspirin on hold.  No active coronary artery disease.  Patient is on hydralazine and nitrates.  He is on pravastatin. -Aspirin on hold due to recurrent hematuria  Dementia:  - No acute behavioral issues, pleasantly confused, oriented to self.  - Continue Celexa, Aricept  Generalized debility - PT OT, recommended skilled nursing facility.  Social work consulted.  Code Status: Full code DVT Prophylaxis: SCDs Family Communication: Discussed all imaging results, lab results, explained to the patient    Disposition Plan: Pending skilled nursing facility,  await hematuria to clear.  Covid test negative  Time Spent in minutes 25 minutes  Procedures:  CBI  Consultants:   Urology  Antimicrobials:   Anti-infectives (From admission, onward)   Start     Dose/Rate Route Frequency Ordered Stop   05/31/19 2000  cefTRIAXone (ROCEPHIN) 1 g in sodium chloride 0.9 % 100 mL IVPB     1 g 200 mL/hr over 30 Minutes Intravenous Every 24 hours 05/31/19 1803           Medications  Scheduled Meds: . amLODipine  5 mg Oral Daily  . Chlorhexidine Gluconate Cloth  6 each Topical Daily  . cholecalciferol  2,000 Units Oral Daily  . citalopram  20 mg Oral Daily  . donepezil  5 mg Oral QHS  . ferrous sulfate  325 mg Oral Daily  . gabapentin  300 mg Oral  BID  . hydrALAZINE  25 mg Oral TID  . isosorbide mononitrate  30 mg Oral Daily  . ketotifen  1 drop Both Eyes BID  . pantoprazole  40 mg Oral Daily  . pravastatin  40 mg Oral q1800  . predniSONE  5 mg Oral QHS  . sodium chloride flush  3 mL Intravenous Q12H  . thiamine  100 mg Oral Daily  . vitamin B-12  1,000 mcg Oral Daily  . vitamin C  500 mg Oral Daily   Continuous Infusions: . sodium chloride 250 mL (06/05/19 1908)  . cefTRIAXone (ROCEPHIN)  IV 1 g (06/05/19 2247)   PRN Meds:.sodium chloride, meclizine, ondansetron (ZOFRAN) IV, sodium chloride flush      Subjective:   Leul Ellis was seen and examined today.  Pleasantly confused, no fevers or chills.  Restarted hematuria, today.  No chest pain, shortness of breath, abdominal pain, N/V  Objective:   Vitals:   06/05/19 2103 06/06/19 0505 06/06/19 0945 06/06/19 1317  BP: (!) 141/63 (!) 144/58 (!) 152/89 129/67  Pulse: 67 67  84  Resp: 18 18    Temp: 99.6 F (37.6 C) 98.4 F (36.9 C)    TempSrc:      SpO2: 99% 98%  95%  Weight:      Height:        Intake/Output Summary (Last 24 hours) at 06/06/2019 1515 Last data filed at 06/06/2019 1400 Gross per 24 hour  Intake 700 ml  Output 1876 ml  Net -1176 ml     Wt Readings from Last 3 Encounters:  06/02/19 58.1 kg  05/29/19 58.1 kg  05/28/19 58.3 kg   Physical Exam  General: Alert and oriented to self, pleasant and cooperative  Eyes:   HEENT:  Atraumatic, normocephalic  Cardiovascular: S1 S2 clear,  RRR. No pedal edema b/l  Respiratory: CTAB, no wheezing, rales or rhonchi  Gastrointestinal: Soft, nontender, nondistended, NBS  Ext: no pedal edema bilaterally  Neuro: no new deficits  Musculoskeletal: No cyanosis, clubbing  Skin: No rashes  Psych: Confused, pleasant  Data Reviewed:  I have personally reviewed following labs and imaging studies  Micro Results Recent Results (from the past 240 hour(s))  Urine C&S     Status: Abnormal    Collection Time: 05/31/19 10:50 AM   Specimen: Urine, Clean Catch  Result Value Ref Range Status   Specimen Description   Final    URINE, CLEAN CATCH Performed at [facility] [address] [place], [place] [address]    Special Requests   Final    NONE Performed at [facility],  [place] [address], [place], [place] [address]    Culture 20,000 COLONIES/mL PROTEUS MIRABILIS (A)  Final   Report Status 06/03/2019 FINAL  Final   Organism ID, Bacteria PROTEUS MIRABILIS (A)  Final      Susceptibility   Proteus mirabilis - MIC*    AMPICILLIN <=2 SENSITIVE Sensitive     CEFAZOLIN <=4 SENSITIVE Sensitive     CEFTRIAXONE <=1 SENSITIVE Sensitive     CIPROFLOXACIN <=0.25 SENSITIVE Sensitive     GENTAMICIN <=1 SENSITIVE Sensitive     IMIPENEM 2 SENSITIVE Sensitive     NITROFURANTOIN 128 RESISTANT Resistant     TRIMETH/SULFA <=20 SENSITIVE Sensitive     AMPICILLIN/SULBACTAM <=2 SENSITIVE Sensitive     PIP/TAZO <=4 SENSITIVE Sensitive     * 20,000 COLONIES/mL PROTEUS MIRABILIS  SARS CORONAVIRUS 2 (TAT 6-24 HRS) Nasopharyngeal Nasopharyngeal Swab     Status: None   Collection Time: 05/31/19  3:18 PM   Specimen: Nasopharyngeal Swab  Result Value Ref Range Status   SARS Coronavirus 2 NEGATIVE NEGATIVE Final    Comment: (NOTE) SARS-CoV-2 target nucleic acids are NOT DETECTED. The SARS-CoV-2 RNA is generally detectable in upper and lower respiratory specimens during the acute phase of infection. Negative results do not preclude SARS-CoV-2 infection, do not rule out co-infections with other pathogens, and should not be used as the sole basis for treatment or other patient management decisions. Negative results must be combined with clinical observations, patient history, and epidemiological information. The expected result is Negative. Fact Sheet for Patients: SugarRoll.be Fact Sheet for Healthcare  Providers: https://www.woods-mathews.com/ This test is not yet approved or cleared by the [place] FDA and  has been authorized for detection and/or diagnosis of SARS-CoV-2 by FDA under an Emergency Use Authorization (EUA). This EUA will remain  in effect (meaning this test can be used) for the duration of the COVID-19 declaration under Section 56 4(b)(1) of the Act, 21 U.S.C. section 360bbb-3(b)(1), unless the authorization is terminated or revoked sooner. Performed at [facility], [place] [address], [place], [place] [address]   SARS CORONAVIRUS 2 (TAT 6-24 HRS) Nasopharyngeal Nasopharyngeal Swab     Status: None   Collection Time: 06/05/19  3:10 PM   Specimen: Nasopharyngeal Swab  Result Value Ref Range Status   SARS Coronavirus 2 NEGATIVE NEGATIVE Final    Comment: (NOTE) SARS-CoV-2 target nucleic acids are NOT DETECTED. The SARS-CoV-2 RNA is generally detectable in upper and lower respiratory specimens during the acute phase of infection. Negative results do not preclude SARS-CoV-2 infection, do not rule out co-infections with other pathogens, and should not be used as the sole basis for treatment or other patient management decisions. Negative results must be combined with clinical observations, patient history, and epidemiological information. The expected result is Negative. Fact Sheet for Patients: SugarRoll.be Fact Sheet for Healthcare Providers: https://www.woods-mathews.com/ This test is not yet approved or cleared by the [place] FDA and  has been authorized for detection and/or diagnosis of SARS-CoV-2 by FDA under an Emergency Use Authorization (EUA). This EUA will remain  in effect (meaning this test can be used) for the duration of the COVID-19 declaration under Section 56 4(b)(1) of the Act, 21 U.S.C. section 360bbb-3(b)(1), unless the authorization is terminated or revoked sooner. Performed at  [facility], [place] [address], [place], [place] [address]     Radiology Reports Dg Chest 2 View  Result Date: 05/10/2019 CLINICAL DATA:  Rule out pneumonia, weakness EXAM: CHEST - 2 VIEW COMPARISON:  02/25/2019 FINDINGS: The heart size and mediastinal contours are within normal limits. Coronary stents. Aortic atherosclerosis. Both lungs are clear. Redemonstrated postoperative findings of right lower lobe wedge resection. Disc degenerative disease of the thoracic spine. IMPRESSION: No acute abnormality of the lungs. Electronically Signed   By: Eddie Candle M.D.   On: 05/10/2019 13:55   Ct Head Wo Contrast  Result Date: 05/10/2019 CLINICAL DATA:  Weakness for 2 days.  Fall yesterday. EXAM: CT HEAD WITHOUT CONTRAST TECHNIQUE: Contiguous axial images were obtained from the base of the skull through the vertex without intravenous contrast. COMPARISON:  February 25, 2019 FINDINGS: Brain: No subdural, epidural, or subarachnoid hemorrhage. Chronic white matter changes are noted. 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 Infarcts are seen in the left cerebellar hemisphere, also stable. The cerebellum is otherwise unchanged. A lacunar infarct is seen within the pons, stable. The brainstem is otherwise normal. Basal cisterns are patent. Ventricles and sulci are prominent but stable. No mass effect or midline shift. No acute cortical ischemia is identified. Vascular: Calcified atherosclerosis is seen in the intracranial carotids. Skull: Normal. Negative for fracture or focal lesion. Sinuses/Orbits: No acute finding. Other: None. IMPRESSION: Chronic white matter changes and infarcts as above. No acute intracranial abnormalities identified. Electronically Signed   By: Dorise Bullion III M.D   On: 05/10/2019 13:52   Ct Chest Wo Contrast  Result Date: 05/23/2019 CLINICAL DATA:  Non-small-cell lung cancer. Restaging. EXAM: CT CHEST WITHOUT CONTRAST TECHNIQUE: Multidetector CT imaging of the  chest was performed following the standard protocol without IV contrast. COMPARISON:  05/27/2018 FINDINGS: Cardiovascular: The heart size is normal. No substantial pericardial effusion. Coronary artery calcification is evident. Atherosclerotic calcification is noted in the wall of the thoracic aorta. Mediastinum/Nodes: No mediastinal lymphadenopathy. No evidence for gross hilar lymphadenopathy although assessment is limited by the lack of intravenous contrast on today's study. The esophagus has normal imaging features. There is no axillary lymphadenopathy. Lungs/Pleura: Centrilobular and paraseptal emphysema evident. Patient is status post right lower lobe superior segmentectomy. 4 mm right middle lobe nodule (87/7) is unchanged. Subtle 6 mm ground-glass opacity in the left lower lobe (108/7) is unchanged in the interval. No focal airspace consolidation. No pleural effusion. Upper Abdomen: Calcified gallstones again noted. Diverticular changes are seen in the abdominal segments of the colon. There is abdominal aortic atherosclerosis. Musculoskeletal: No worrisome lytic or sclerotic osseous abnormality. IMPRESSION: 1. Stable exam. No new or progressive findings to suggest recurrent or metastatic disease in the chest. 2. Emphysema. 3. Coronary artery and thoracoabdominal aortic atherosclerosis. 4. Cholelithiasis. Aortic Atherosclerosis (ICD10-I70.0) and Emphysema (ICD10-J43.9). Electronically Signed   By: Misty Stanley M.D.   On: 05/23/2019 13:10   US Pelvis Limited (transabdominal Only)  Result Date: 05/31/2019 CLINICAL DATA:  Hematuria.  Scan for clots. EXAM: LIMITED ULTRASOUND OF PELVIS TECHNIQUE: Limited transabdominal ultrasound examination of the pelvis was performed. COMPARISON:  None. FINDINGS: The study was suboptimal as the patient began vomiting in the study was ended early. A Foley catheter is identified. The bladder wall appears markedly thickened. No anechoic urine is seen between the bladder wall  and the catheter balloon. There appears to be some heterogeneous material around the Foley catheter. IMPRESSION: The study is limited as it had to be ended early due to patient condition. There is marked bladder wall thickening. There is heterogeneous material around the Foley catheter which could represent blood products given history. A bladder mass cannot be confidently excluded on this study. Electronically Signed  By: Dorise Bullion III M.D   On: 05/31/2019 15:27   Ct Renal Stone Study  Result Date: 05/10/2019 CLINICAL DATA:  Weakness for 2 days.  Fall.  Hematuria. EXAM: CT ABDOMEN AND PELVIS WITHOUT CONTRAST TECHNIQUE: Multidetector CT imaging of the abdomen and pelvis was performed following the standard protocol without IV contrast. COMPARISON:  Multiple CT scans since January 17, 2015 FINDINGS: Lower chest: No acute abnormality. Hepatobiliary: The liver is unremarkable. Cholelithiasis is identified without wall thickening. Pancreas: Unremarkable. No pancreatic ductal dilatation or surrounding inflammatory changes. Spleen: Normal in size without focal abnormality. Adrenals/Urinary Tract: A 2 mm stone is seen in the left kidney on coronal image 66. Bilateral renal cysts are noted. There is an unusual contour to the inferior left kidney. This was described as a complex cyst on a previous study from June of 2016. This lesion demonstrates an attenuation of 31 Hounsfield units today but is much smaller when compared to June of 2016 suggesting a benign etiology. Mild hydronephrosis and ureterectasis on the right. The distal right ureter is obscured by streak artifact off hip replacements. The left ureter is normal in caliber. No hydronephrosis on the left. No perinephric stranding adjacent to either kidney. Visualized portions of the bladder are normal. Stomach/Bowel: The stomach and small bowel are normal. A portion of the sigmoid colon is obscured by streak artifact off the hip replacements. Colonic  diverticulosis is identified, particularly in the descending and sigmoid colon. The remainder of the colon is normal. No evidence of appendicitis. Vascular/Lymphatic: Dense atherosclerotic changes are seen in the nonaneurysmal aorta, iliac vessels, and femoral vessels. No adenopathy. Reproductive: Previous prostatectomy. Prostate bed is obscured by streak artifact. Other: No abdominal wall hernia or abnormality. No abdominopelvic ascites. Musculoskeletal: Bilateral hip replacements are identified. Bones are stable since April 01, 2019. IMPRESSION: 1. There is mild right hydronephrosis and ureterectasis. The distal most right ureter is obscured by streak artifact off hip replacements. The findings could represent either a stone in the distal right ureter or a recently passed stone as the finding is new when compared April 01, 2019. 2. There is an unusual contour to the inferior left kidney. A complex lesion was seen in this region on previous studies. While the finding is nonspecific on on today's study, the fact that the complex lesion seen in 2016 is significantly smaller today suggests a benign etiology. 3. 2 mm stone in the left kidney. 4. Colonic diverticulosis without diverticulitis. Atherosclerotic changes in the nonaneurysmal aorta and branching vessels. Electronically Signed   By: Dorise Bullion III M.D   On: 05/10/2019 17:19    Lab Data:  CBC: Recent Labs  Lab 05/31/19 1302 05/31/19 2353 06/01/19 4166  06/03/19 0630 06/03/19 1651 06/04/19 0510 06/05/19 0457 06/06/19 0511  WBC 8.1 10.9* 6.8  --   --   --   --  4.9 5.0  HGB 6.7* 7.6* 6.4*   < > 7.7* 7.6* 7.9* 8.2* 7.8*  HCT 22.6* 24.4* 20.6*   < > 25.3* 24.2* 25.0* 26.3* 25.6*  MCV 102.7* 96.8 98.6  --   --   --   --  95.6 96.2  PLT 153 155 121*  --   --   --   --  122* 120*   < > = values in this interval not displayed.   Basic Metabolic Panel: Recent Labs  Lab 06/02/19 0428 06/03/19 0458 06/04/19 0510 06/05/19 0457  06/06/19 0511  NA 138 139 139 139 140  K 4.4 4.5  4.2 4.2 4.3  CL 110 109 108 107 109  CO2 21* 22 23 22 23   GLUCOSE 99 139* 114* 106* 108*  BUN 51* 42* 38* 40* 37*  CREATININE 2.69* 2.32* 2.02* 1.90* 1.91*  CALCIUM 8.3* 8.3* 8.2* 8.4* 8.2*   GFR: Estimated Creatinine Clearance: 25.8 mL/min (A) (by C-G formula based on SCr of 1.91 mg/dL (H)). Liver Function Tests: Recent Labs  Lab 05/31/19 1307 06/01/19 0519  AST 14* 13*  ALT 8 7  ALKPHOS 63 58  BILITOT 1.1 1.0  PROT 5.7* 4.8*  ALBUMIN 3.0* 2.6*   No results for input(s): LIPASE, AMYLASE in the last 168 hours. Recent Labs  Lab 05/31/19 1330  AMMONIA 15   Coagulation Profile: No results for input(s): INR, PROTIME in the last 168 hours. Cardiac Enzymes: No results for input(s): CKTOTAL, CKMB, CKMBINDEX, TROPONINI in the last 168 hours. BNP (last 3 results) No results for input(s): PROBNP in the last 8760 hours. HbA1C: No results for input(s): HGBA1C in the last 72 hours. CBG: No results for input(s): GLUCAP in the last 168 hours. Lipid Profile: No results for input(s): CHOL, HDL, LDLCALC, TRIG, CHOLHDL, LDLDIRECT in the last 72 hours. Thyroid Function Tests: No results for input(s): TSH, T4TOTAL, FREET4, T3FREE, THYROIDAB in the last 72 hours. Anemia Panel: No results for input(s): VITAMINB12, FOLATE, FERRITIN, TIBC, IRON, RETICCTPCT in the last 72 hours. Urine analysis:    Component Value Date/Time   COLORURINE STRAW (A) 05/31/2019 1050   APPEARANCEUR CLOUDY (A) 05/31/2019 1050   LABSPEC 1.018 05/31/2019 1050   PHURINE 8.0 05/31/2019 1050   GLUCOSEU 50 (A) 05/31/2019 1050   GLUCOSEU NEGATIVE 12/25/2018 1043   HGBUR LARGE (A) 05/31/2019 1050   BILIRUBINUR NEGATIVE 05/31/2019 1050   KETONESUR NEGATIVE 05/31/2019 1050   PROTEINUR >=300 (A) 05/31/2019 1050   UROBILINOGEN 0.2 12/25/2018 1043   NITRITE NEGATIVE 05/31/2019 1050   LEUKOCYTESUR NEGATIVE 05/31/2019 1050     Ripudeep Rai M.D. [facility] 06/06/2019, 3:15 PM  Pager: [phone] Between 7am to 7pm - call Pager - [phone]  After 7pm go to [email or web address] - password [email or web address]  Call night coverage person covering after 7pm

## 2019-06-06 NOTE — Progress Notes (Signed)
Pt urine became bloody in foley catheter bag. MD notified, verbal orders given to restart CBI at low rate. Will continue to monitor.

## 2019-06-06 NOTE — Progress Notes (Signed)
Pt urine is now a clear yellow. CBI stopped. Will continue to monitor.

## 2019-06-06 NOTE — Progress Notes (Addendum)
One bedbug was found on patient on hospital admission date (05/31/2019). No other bedbugs have been found since 05/31/2019.

## 2019-06-07 LAB — BASIC METABOLIC PANEL
Anion gap: 8 (ref 5–15)
BUN: 38 mg/dL — ABNORMAL HIGH (ref 8–23)
CO2: 25 mmol/L (ref 22–32)
Calcium: 8.5 mg/dL — ABNORMAL LOW (ref 8.9–10.3)
Chloride: 108 mmol/L (ref 98–111)
Creatinine, Ser: 1.95 mg/dL — ABNORMAL HIGH (ref 0.61–1.24)
GFR calc Af Amer: 37 mL/min — ABNORMAL LOW (ref >60)
GFR calc non Af Amer: 32 mL/min — ABNORMAL LOW (ref >60)
Glucose, Bld: 106 mg/dL — ABNORMAL HIGH (ref 70–99)
Potassium: 4.6 mmol/L (ref 3.5–5.1)
Sodium: 141 mmol/L (ref 135–145)

## 2019-06-07 LAB — CBC
HCT: 26.2 % — ABNORMAL LOW (ref 39.0–52.0)
Hemoglobin: 8 g/dL — ABNORMAL LOW (ref 13.0–17.0)
MCH: 29.4 pg (ref 26.0–34.0)
MCHC: 30.5 g/dL (ref 30.0–36.0)
MCV: 96.3 fL (ref 80.0–100.0)
Platelets: 133 10*3/uL — ABNORMAL LOW (ref 150–400)
RBC: 2.72 MIL/uL — ABNORMAL LOW (ref 4.22–5.81)
RDW: 14 % (ref 11.5–15.5)
WBC: 4.8 10*3/uL (ref 4.0–10.5)
nRBC: 0 % (ref 0.0–0.2)

## 2019-06-07 MED ORDER — TRAMADOL HCL 50 MG PO TABS
50.0000 mg | ORAL_TABLET | Freq: Once | ORAL | Status: AC
  Administered 2019-06-07: 50 mg via ORAL
  Filled 2019-06-07: qty 1

## 2019-06-07 NOTE — Progress Notes (Signed)
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 Tammi Klippel) with Nadir undetectable --> biochemical recurrence --> intermitant ADT with Lupron   08/2014 PSA 6.39 --> (no new bone pain / localizing symtpoms) --> Lupron 45mg ; 02/2015 PSA 1.97 / T 22; 05/2015 PSA 3.91, T 21 --> Lupron 45 + starting bicalutamide  08/2015 PSA 5.55 / T (on Lupron + bicalutamide) --> starting Xtandi; 11/2015 PSA 4.2 / T 33 --> STOP Xtandi due to extreme fatigue; 03/2016 - PSA 8.55  06/2016 CT, Bone Scan - no measurable disease, no hydro PSA 7.72 / T 27 ==> Lupron 45  02/2017 - PSA 14.5, Cr 1.9, T 11 ==> Lupron 45; 05/2017 PSA 29.7, Cr 1.6, T <10 on Lupron, Start Zytiga + Pred  08/2017 - PSA 17.7, Cr 2.0, T <10 on Lupron + Zytiga + Pred; 01/2018 PSA 10.4, T <10, Cr 1.8 on Lupron + Zytiga + Pred  09/2018 - PSA 18.5, T <10 on Lupron + Zytiga + Pred; 03/2019 PSA 52, T<10, Cr 2.2 CT, BS - no measurable disease on Lupron + Zytiga + Pred    2 - Mixed Urinary Incontinence - manages with adult diaper plus Toviaz for urge and stress incontinence. PVR 08/2015 "22mL". Becoming more botherseome, tryng daytime penile clamp 01/2018.    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 LIbido preserved. Unaded gets partial erection not able to penetrate. He does use long-acting nitro. Presently manages with exernal pump prn with moderate satisvaction.   5 - Stage 3 Renal Insufficiency - Cr 1.6-2 x years. Piror renal imaging w/o sig hydro.   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 Progressive prostate cancer as per above. On Plavix at baseline, now being held. Received transfusion x several on admit. Hgb 10/19 8.8 up from 6.8 and with 3 way  catheter on irrigation. CBI slowly weaned to off and Hgb stable 10/23. Foley removed AM 10/24.   Today Victor Davis is stable. Urine remains very clear off irrigation.    Objective: Vital signs in last 24 hours: Temp:  [98.9 F (37.2 C)] 98.9 F (37.2 C) (10/23 1317) Pulse Rate:  [67-84] 67 (10/24 0449) Resp:  [14] 14 (10/24 0449) BP: (129-152)/(67-89) 149/67 (10/24 0449) SpO2:  [93 %-95 %] 95 % (10/24 0449) Weight:  [56.8 kg] 56.8 kg (10/24 0449) Last BM Date: 06/06/19  Intake/Output from previous day: 10/23 0701 - 10/24 0700 In: 480 [P.O.:480] Out: 2175 [Urine:2175] Intake/Output this shift: Total I/O In: -  Out: 850 [Urine:850]   General appearance: alert, cooperative and frail, at baseline. Pleasant.  Eyes: negative Nose: Nares normal. Septum midline. Mucosa normal. No drainage or sinus tenderness. Throat: lips, mucosa, and tongue normal; teeth and gums normal Neck: supple, symmetrical, trachea midline Back: symmetric, no curvature. ROM normal. No CVA tenderness. Resp: non-labored on room air.  Cardio: Nl rate GI: soft, non-tender; bowel sounds normal; no masses,  no organomegaly Male genitalia: normal, 3 way foley in place with clear yellow urine off irrigation again. Removed. Foreskin reduced.  Extremities: extremities normal, atraumatic, no cyanosis or edema Pulses: 2+ and symmetric Skin: Skin color, texture, turgor normal. No rashes or lesions Lymph nodes: Cervical, supraclavicular, and  axillary nodes normal.  Lab Results:  Recent Labs    06/06/19 0511 06/07/19 0535  WBC 5.0 4.8  HGB 7.8* 8.0*  HCT 25.6* 26.2*  PLT 120* 133*   BMET Recent Labs    06/05/19 0457 06/06/19 0511  NA 139 140  K 4.2 4.3  CL 107 109  CO2 22 23  GLUCOSE 106* 108*  BUN 40* 37*  CREATININE 1.90* 1.91*  CALCIUM 8.4* 8.2*   PT/INR No results for input(s): LABPROT, INR in the last 72 hours. ABG No results for input(s): PHART, HCO3 in the last 72 hours.  Invalid input(s):  PCO2, PO2  Studies/Results: No results found.  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 He has very poor functional status and is not candidate for aggressive surgery or cytotoxic chemotherapy. Agree with holding blood thinners for now as he is also fall risk in addition to active bleeding.   Foley out today. Very high threshold for replacement.   Will likely benefit from hospice transiiton in next year or so based on current disease progression and functional decline.   Hopefully OK for DC home as soon as tomorrow from GU perspetive as lone as voiding with stable Hgb.   Appreciate hospitalist service comanagment.   Alexis Frock 06/07/2019

## 2019-06-07 NOTE — Progress Notes (Signed)
[facility]                                                                              Patient Demographics  Victor Davis, is a 78 y.o. male, DOB - 21-Jan-1941, QVZ:[id number]  Admit date - 05/31/2019   Admitting Physician Jani Gravel, MD  Outpatient Primary MD for the patient is Biagio Borg, MD  Outpatient specialists:   LOS - 6  days   Medical records reviewed and are as summarized below:    Chief Complaint  Patient presents with  . Dysuria  . Hematuria  . Pelvic Pain       Brief summary   78 year old, prostate cancer castrate resistant, CKD stage IV, coronary artery disease presented to the hospital with sudden onset gross hematuria and blood loss anemia.  Patient was started on three-way Foley catheter and CBI and admitted to the hospital.  Followed by urology. Patient has history of hypertension.  Baseline creatinine is about 2.7-2.8.  Patient refused to be on aspirin and Plavix, his Plavix was stopped about 3 weeks ago.  Was still on aspirin when presented to the hospital.  Received total 3 units of PRBC to date.    Assessment & Plan   Gross hematuria with history of prostate CA,  -Resolved, patient was treated with CBI.  CBI has been stopped now. -Patient was followed by urology, recommended cystoscopy if he fails a CBI -Urine culture had shown 20,000 colonies of Proteus mirabilis on 10/17, received 7 days IV antibiotics -Hematuria restarted again on 10/23, placed back on CBI.  Hematuria now cleared, appreciate urology recommendations, DC Foley today and voiding trial. -Per Dr. Tresa Moore, if H&H remained stable, patient may be discharged tomorrow  History of nonmetastatic castrate resistant prostate CA -Urology aware, Dr. Tammi Klippel considering other options, patient follows Dr. Tresa Moore urology.   -Will likely benefit from hospice transition in future based on the disease progression and functional decline  Acute blood loss anemia in the setting of  chronic anemia -Patient received total 3 units of packed RBCs through the hospitalization.   -H&H currently stable 8.0  Acute kidney injury on CKD stage IV -Improving, patient presented with creatinine of 2.8 Creatinine plateaued at 1.9  Essential hypertension -BP stable, continue hydralazine, amlodipine  History of CAD -Used to be on Plavix, aspirin.  Plavix was discontinued as per reports, aspirin on hold, no active chest pain or shortness of breath. - will continue to hold aspirin due to recurrent hematuria.   -Continue statin, hydralazine, nitrates  History of coronary artery disease: Used to be on Plavix and aspirin.  Plavix was discontinued sometime ago as per reports, aspirin on hold.  No active coronary artery disease.  Patient is on hydralazine and nitrates.  He is on pravastatin. -Aspirin on hold due to recurrent hematuria  Dementia:  - No acute behavioral issues, pleasantly confused, oriented to self.  - Continue Celexa, Aricept  Generalized debility - PT OT, recommended skilled nursing facility.  Social work consulted.  Code Status: Full code DVT Prophylaxis: SCDs Family Communication: Discussed all imaging results, lab results, explained to the patient    Disposition Plan:  Possible DC to skilled nursing facility tomorrow if no further issues.  Covid test negative  Time Spent in minutes 25 minutes  Procedures:  CBI  Consultants:   Urology  Antimicrobials:   Anti-infectives (From admission, onward)   Start     Dose/Rate Route Frequency Ordered Stop   05/31/19 2000  cefTRIAXone (ROCEPHIN) 1 g in sodium chloride 0.9 % 100 mL IVPB  Status:  Discontinued     1 g 200 mL/hr over 30 Minutes Intravenous Every 24 hours 05/31/19 1803 06/07/19 0627         Medications  Scheduled Meds: . amLODipine  5 mg Oral Daily  . Chlorhexidine Gluconate Cloth  6 each Topical Daily  . cholecalciferol  2,000 Units Oral Daily  . citalopram  20 mg Oral Daily  .  donepezil  5 mg Oral QHS  . ferrous sulfate  325 mg Oral Daily  . gabapentin  300 mg Oral BID  . hydrALAZINE  25 mg Oral TID  . isosorbide mononitrate  30 mg Oral Daily  . ketotifen  1 drop Both Eyes BID  . pantoprazole  40 mg Oral Daily  . pravastatin  40 mg Oral q1800  . predniSONE  5 mg Oral QHS  . sodium chloride flush  3 mL Intravenous Q12H  . thiamine  100 mg Oral Daily  . vitamin B-12  1,000 mcg Oral Daily  . vitamin C  500 mg Oral Daily   Continuous Infusions: . sodium chloride 250 mL (06/05/19 1908)   PRN Meds:.sodium chloride, meclizine, ondansetron (ZOFRAN) IV, sodium chloride flush      Subjective:   Victor Davis was seen and examined today.  No acute issues overnight, hematuria cleared no fevers or chills..  No chest pain, shortness of breath, abdominal pain, N/V  Objective:   Vitals:   06/06/19 0945 06/06/19 1317 06/06/19 2128 06/07/19 0449  BP: (!) 152/89 129/67 139/69 (!) 149/67  Pulse:  84 72 67  Resp:   14 14  Temp:  98.9 F (37.2 C)    TempSrc:  Oral    SpO2:  95% 93% 95%  Weight:    56.8 kg  Height:        Intake/Output Summary (Last 24 hours) at 06/07/2019 1319 Last data filed at 06/07/2019 0451 Gross per 24 hour  Intake 0 ml  Output 850 ml  Net -850 ml     Wt Readings from Last 3 Encounters:  06/07/19 56.8 kg  05/29/19 58.1 kg  05/28/19 58.3 kg   Physical Exam  General: Alert and oriented x self, NAD, dementia, pleasant  Eyes:   HEENT:  Atraumatic, normocephalic  Cardiovascular: S1 S2 clear, no murmurs, RRR. No pedal edema b/l  Respiratory: CTAB, no wheezing, rales or rhonchi  Gastrointestinal: Soft, nontender, nondistended, NBS  Ext: no pedal edema bilaterally  Neuro: no new deficits  Musculoskeletal: No cyanosis, clubbing  Skin: No rashes  Psych: Dementia   Data Reviewed:  I have personally reviewed following labs and imaging studies  Micro Results Recent Results (from the past 240 hour(s))  Urine C&S      Status: Abnormal   Collection Time: 05/31/19 10:50 AM   Specimen: Urine, Clean Catch  Result Value Ref Range Status   Specimen Description   Final    URINE, CLEAN CATCH Performed at [facility] [address], [place], [place] [address]    Special Requests   Final    NONE Performed at [facility], [address] [place], [place] [address]    Culture 20,000 COLONIES/mL PROTEUS MIRABILIS (A)  Final   Report Status 06/03/2019 FINAL  Final   Organism ID, Bacteria PROTEUS MIRABILIS (A)  Final      Susceptibility   Proteus mirabilis - MIC*    AMPICILLIN <=2 SENSITIVE Sensitive     CEFAZOLIN <=4 SENSITIVE Sensitive     CEFTRIAXONE <=1 SENSITIVE Sensitive     CIPROFLOXACIN <=0.25 SENSITIVE Sensitive     GENTAMICIN <=1 SENSITIVE Sensitive     IMIPENEM 2 SENSITIVE Sensitive     NITROFURANTOIN 128 RESISTANT Resistant     TRIMETH/SULFA <=20 SENSITIVE Sensitive     AMPICILLIN/SULBACTAM <=2 SENSITIVE Sensitive     PIP/TAZO <=4 SENSITIVE Sensitive     * 20,000 COLONIES/mL PROTEUS MIRABILIS  SARS CORONAVIRUS 2 (TAT 6-24 HRS) Nasopharyngeal Nasopharyngeal Swab     Status: None   Collection Time: 05/31/19  3:18 PM   Specimen: Nasopharyngeal Swab  Result Value Ref Range Status   SARS Coronavirus 2 NEGATIVE NEGATIVE Final    Comment: (NOTE) SARS-CoV-2 target nucleic acids are NOT DETECTED. The SARS-CoV-2 RNA is generally detectable in upper and lower respiratory specimens during the acute phase of infection. Negative results do not preclude SARS-CoV-2 infection, do not rule out co-infections with other pathogens, and should not be used as the sole basis for treatment or other patient management decisions. Negative results must be combined with clinical observations, patient history, and epidemiological information. The expected result is Negative. Fact Sheet for Patients: SugarRoll.be Fact Sheet for Healthcare  Providers: https://www.woods-mathews.com/ This test is not yet approved or cleared by the [place] FDA and  has been authorized for detection and/or diagnosis of SARS-CoV-2 by FDA under an Emergency Use Authorization (EUA). This EUA will remain  in effect (meaning this test can be used) for the duration of the COVID-19 declaration under Section 56 4(b)(1) of the Act, 21 U.S.C. section 360bbb-3(b)(1), unless the authorization is terminated or revoked sooner. Performed at [facility], [place] [address], [place], [place] [address]   SARS CORONAVIRUS 2 (TAT 6-24 HRS) Nasopharyngeal Nasopharyngeal Swab     Status: None   Collection Time: 06/05/19  3:10 PM   Specimen: Nasopharyngeal Swab  Result Value Ref Range Status   SARS Coronavirus 2 NEGATIVE NEGATIVE Final    Comment: (NOTE) SARS-CoV-2 target nucleic acids are NOT DETECTED. The SARS-CoV-2 RNA is generally detectable in upper and lower respiratory specimens during the acute phase of infection. Negative results do not preclude SARS-CoV-2 infection, do not rule out co-infections with other pathogens, and should not be used as the sole basis for treatment or other patient management decisions. Negative results must be combined with clinical observations, patient history, and epidemiological information. The expected result is Negative. Fact Sheet for Patients: SugarRoll.be Fact Sheet for Healthcare Providers: https://www.woods-mathews.com/ This test is not yet approved or cleared by the [place] FDA and  has been authorized for detection and/or diagnosis of SARS-CoV-2 by FDA under an Emergency Use Authorization (EUA). This EUA will remain  in effect (meaning this test can be used) for the duration of the COVID-19 declaration under Section 56 4(b)(1) of the Act, 21 U.S.C. section 360bbb-3(b)(1), unless the authorization is terminated or revoked sooner. Performed at  [facility], [place] [address], [place],  [address]     Radiology Reports Dg Chest 2 View  Result Date: 05/10/2019 CLINICAL DATA:  Rule out pneumonia, weakness EXAM: CHEST - 2 VIEW COMPARISON:  02/25/2019 FINDINGS: The heart size and mediastinal contours are within normal limits. Coronary stents. Aortic atherosclerosis. Both lungs are clear. Redemonstrated postoperative findings of right lower lobe wedge resection. Disc degenerative disease of the thoracic spine. IMPRESSION: No acute abnormality of the lungs. Electronically Signed   By: Eddie Candle M.D.   On: 05/10/2019 13:55   Ct Head Wo Contrast  Result Date: 05/10/2019 CLINICAL DATA:  Weakness for 2 days.  Fall yesterday. EXAM: CT HEAD WITHOUT CONTRAST TECHNIQUE: Contiguous axial images were obtained from the base of the skull through the vertex without intravenous contrast. COMPARISON:  February 25, 2019 FINDINGS: Brain: No subdural, epidural, or subarachnoid hemorrhage. Chronic white matter changes are noted. 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 Infarcts are seen in the left cerebellar hemisphere, also stable. The cerebellum is otherwise unchanged. A lacunar infarct is seen within the pons, stable. The brainstem is otherwise normal. Basal cisterns are patent. Ventricles and sulci are prominent but stable. No mass effect or midline shift. No acute cortical ischemia is identified. Vascular: Calcified atherosclerosis is seen in the intracranial carotids. Skull: Normal. Negative for fracture or focal lesion. Sinuses/Orbits: No acute finding. Other: None. IMPRESSION: Chronic white matter changes and infarcts as above. No acute intracranial abnormalities identified. Electronically Signed   By: Dorise Bullion III M.D   On: 05/10/2019 13:52   Ct Chest Wo Contrast  Result Date: 05/23/2019 CLINICAL DATA:  Non-small-cell lung cancer. Restaging. EXAM: CT CHEST WITHOUT CONTRAST TECHNIQUE: Multidetector CT imaging of the  chest was performed following the standard protocol without IV contrast. COMPARISON:  05/27/2018 FINDINGS: Cardiovascular: The heart size is normal. No substantial pericardial effusion. Coronary artery calcification is evident. Atherosclerotic calcification is noted in the wall of the thoracic aorta. Mediastinum/Nodes: No mediastinal lymphadenopathy. No evidence for gross hilar lymphadenopathy although assessment is limited by the lack of intravenous contrast on today's study. The esophagus has normal imaging features. There is no axillary lymphadenopathy. Lungs/Pleura: Centrilobular and paraseptal emphysema evident. Patient is status post right lower lobe superior segmentectomy. 4 mm right middle lobe nodule (87/7) is unchanged. Subtle 6 mm ground-glass opacity in the left lower lobe (108/7) is unchanged in the interval. No focal airspace consolidation. No pleural effusion. Upper Abdomen: Calcified gallstones again noted. Diverticular changes are seen in the abdominal segments of the colon. There is abdominal aortic atherosclerosis. Musculoskeletal: No worrisome lytic or sclerotic osseous abnormality. IMPRESSION: 1. Stable exam. No new or progressive findings to suggest recurrent or metastatic disease in the chest. 2. Emphysema. 3. Coronary artery and thoracoabdominal aortic atherosclerosis. 4. Cholelithiasis. Aortic Atherosclerosis (ICD10-I70.0) and Emphysema (ICD10-J43.9). Electronically Signed   By: Misty Stanley M.D.   On: 05/23/2019 13:10   US Pelvis Limited (transabdominal Only)  Result Date: 05/31/2019 CLINICAL DATA:  Hematuria.  Scan for clots. EXAM: LIMITED ULTRASOUND OF PELVIS TECHNIQUE: Limited transabdominal ultrasound examination of the pelvis was performed. COMPARISON:  None. FINDINGS: The study was suboptimal as the patient began vomiting in the study was ended early. A Foley catheter is identified. The bladder wall appears markedly thickened. No anechoic urine is seen between the bladder wall  and the catheter balloon. There appears to be some heterogeneous material around the Foley catheter. IMPRESSION: The study is limited as it had to be ended early due to patient condition. There is marked bladder wall thickening. There is heterogeneous material around the Foley catheter which could represent blood products given history. A bladder mass cannot be confidently excluded on this study. Electronically Signed  By: Dorise Bullion III M.D   On: 05/31/2019 15:27   Ct Renal Stone Study  Result Date: 05/10/2019 CLINICAL DATA:  Weakness for 2 days.  Fall.  Hematuria. EXAM: CT ABDOMEN AND PELVIS WITHOUT CONTRAST TECHNIQUE: Multidetector CT imaging of the abdomen and pelvis was performed following the standard protocol without IV contrast. COMPARISON:  Multiple CT scans since January 17, 2015 FINDINGS: Lower chest: No acute abnormality. Hepatobiliary: The liver is unremarkable. Cholelithiasis is identified without wall thickening. Pancreas: Unremarkable. No pancreatic ductal dilatation or surrounding inflammatory changes. Spleen: Normal in size without focal abnormality. Adrenals/Urinary Tract: A 2 mm stone is seen in the left kidney on coronal image 66. Bilateral renal cysts are noted. There is an unusual contour to the inferior left kidney. This was described as a complex cyst on a previous study from June of 2016. This lesion demonstrates an attenuation of 31 Hounsfield units today but is much smaller when compared to June of 2016 suggesting a benign etiology. Mild hydronephrosis and ureterectasis on the right. The distal right ureter is obscured by streak artifact off hip replacements. The left ureter is normal in caliber. No hydronephrosis on the left. No perinephric stranding adjacent to either kidney. Visualized portions of the bladder are normal. Stomach/Bowel: The stomach and small bowel are normal. A portion of the sigmoid colon is obscured by streak artifact off the hip replacements. Colonic  diverticulosis is identified, particularly in the descending and sigmoid colon. The remainder of the colon is normal. No evidence of appendicitis. Vascular/Lymphatic: Dense atherosclerotic changes are seen in the nonaneurysmal aorta, iliac vessels, and femoral vessels. No adenopathy. Reproductive: Previous prostatectomy. Prostate bed is obscured by streak artifact. Other: No abdominal wall hernia or abnormality. No abdominopelvic ascites. Musculoskeletal: Bilateral hip replacements are identified. Bones are stable since April 01, 2019. IMPRESSION: 1. There is mild right hydronephrosis and ureterectasis. The distal most right ureter is obscured by streak artifact off hip replacements. The findings could represent either a stone in the distal right ureter or a recently passed stone as the finding is new when compared April 01, 2019. 2. There is an unusual contour to the inferior left kidney. A complex lesion was seen in this region on previous studies. While the finding is nonspecific on on today's study, the fact that the complex lesion seen in 2016 is significantly smaller today suggests a benign etiology. 3. 2 mm stone in the left kidney. 4. Colonic diverticulosis without diverticulitis. Atherosclerotic changes in the nonaneurysmal aorta and branching vessels. Electronically Signed   By: Dorise Bullion III M.D   On: 05/10/2019 17:19    Lab Data:  CBC: Recent Labs  Lab 05/31/19 2353 06/01/19 7619  06/03/19 1651 06/04/19 0510 06/05/19 0457 06/06/19 0511 06/07/19 0535  WBC 10.9* 6.8  --   --   --  4.9 5.0 4.8  HGB 7.6* 6.4*   < > 7.6* 7.9* 8.2* 7.8* 8.0*  HCT 24.4* 20.6*   < > 24.2* 25.0* 26.3* 25.6* 26.2*  MCV 96.8 98.6  --   --   --  95.6 96.2 96.3  PLT 155 121*  --   --   --  122* 120* 133*   < > = values in this interval not displayed.   Basic Metabolic Panel: Recent Labs  Lab 06/03/19 0458 06/04/19 0510 06/05/19 0457 06/06/19 0511 06/07/19 0535  NA 139 139 139 140 141  K 4.5 4.2  4.2 4.3 4.6  CL 109 108 107 109 108  CO2 22  23 22 23 25   GLUCOSE 139* 114* 106* 108* 106*  BUN 42* 38* 40* 37* 38*  CREATININE 2.32* 2.02* 1.90* 1.91* 1.95*  CALCIUM 8.3* 8.2* 8.4* 8.2* 8.5*   GFR: Estimated Creatinine Clearance: 24.7 mL/min (A) (by C-G formula based on SCr of 1.95 mg/dL (H)). Liver Function Tests: Recent Labs  Lab 06/01/19 0519  AST 13*  ALT 7  ALKPHOS 58  BILITOT 1.0  PROT 4.8*  ALBUMIN 2.6*   No results for input(s): LIPASE, AMYLASE in the last 168 hours. Recent Labs  Lab 05/31/19 1330  AMMONIA 15   Coagulation Profile: No results for input(s): INR, PROTIME in the last 168 hours. Cardiac Enzymes: No results for input(s): CKTOTAL, CKMB, CKMBINDEX, TROPONINI in the last 168 hours. BNP (last 3 results) No results for input(s): PROBNP in the last 8760 hours. HbA1C: No results for input(s): HGBA1C in the last 72 hours. CBG: No results for input(s): GLUCAP in the last 168 hours. Lipid Profile: No results for input(s): CHOL, HDL, LDLCALC, TRIG, CHOLHDL, LDLDIRECT in the last 72 hours. Thyroid Function Tests: No results for input(s): TSH, T4TOTAL, FREET4, T3FREE, THYROIDAB in the last 72 hours. Anemia Panel: No results for input(s): VITAMINB12, FOLATE, FERRITIN, TIBC, IRON, RETICCTPCT in the last 72 hours. Urine analysis:    Component Value Date/Time   COLORURINE STRAW (A) 05/31/2019 1050   APPEARANCEUR CLOUDY (A) 05/31/2019 1050   LABSPEC 1.018 05/31/2019 1050   PHURINE 8.0 05/31/2019 1050   GLUCOSEU 50 (A) 05/31/2019 1050   GLUCOSEU NEGATIVE 12/25/2018 1043   HGBUR LARGE (A) 05/31/2019 1050   BILIRUBINUR NEGATIVE 05/31/2019 1050   KETONESUR NEGATIVE 05/31/2019 1050   PROTEINUR >=300 (A) 05/31/2019 1050   UROBILINOGEN 0.2 12/25/2018 1043   NITRITE NEGATIVE 05/31/2019 1050   LEUKOCYTESUR NEGATIVE 05/31/2019 1050       M.D. [facility] 06/07/2019, 1:19 PM  Pager: [phone] Between 7am to 7pm - call Pager -  [phone]  After 7pm go to [email or web address] - password [email or web address]  Call night coverage person covering after 7pm

## 2019-06-08 DIAGNOSIS — N390 Urinary tract infection, site not specified: Secondary | ICD-10-CM | POA: Diagnosis not present

## 2019-06-08 DIAGNOSIS — I2510 Atherosclerotic heart disease of native coronary artery without angina pectoris: Secondary | ICD-10-CM | POA: Diagnosis not present

## 2019-06-08 MED ORDER — VITAMIN B-1 50 MG PO TABS: 100.0000 mg | ORAL_TABLET | Freq: Every day | ORAL | Status: DC

## 2019-06-08 NOTE — TOC Progression Note (Signed)
Transition of Care [facility]) - Progression Note    Patient Details  Name: SIEGFRIED VIETH MRN: [id number] Date of Birth: 03-30-1941  Transition of Care [facility] CM/SW Contact  Joaquin Courts, RN Phone Number: 06/08/2019, 3:29 PM  Clinical Narrative:    Patient set up with Alvis Lemmings for [place], [place], Nurse, and Aide. No DME needs.    Expected Discharge Plan: Early Barriers to Discharge: No Barriers Identified  Expected Discharge Plan and Services Expected Discharge Plan: [place]   Discharge Planning Services: CM Consult Post Acute Care Choice: Resumption of Svcs/PTA Provider Living arrangements for the past 2 months: Single Family Home Expected Discharge Date: 06/08/19               DME Arranged: N/A DME Agency: NA       HH Arranged: Nurse's Aide, RN, OT, PT HH Agency: [facility] Date [place]: 06/08/19 Time HH Agency Contacted: [phone] Representative spoke with at [place]: confirmed with Adela Lank   Social Determinants of Health (SDOH) Interventions    Readmission Risk Interventions No flowsheet data found.

## 2019-06-08 NOTE — Progress Notes (Signed)
Spoke to case manager Beverlee Nims and she stated patient have been setup with [facility] home health and patient is ready to be discharged.

## 2019-06-08 NOTE — Discharge Summary (Signed)
Physician Discharge Summary   Patient ID: Victor Davis MRN: [id number] DOB/AGE: 1940/12/21 78 y.o.  Admit date: 05/31/2019 Discharge date: 06/08/2019  Primary Care Physician:  Biagio Borg, MD   Recommendations for Outpatient Follow-up:  1. Follow up with PCP in 1-2 weeks 2. Continue Foley catheter as he is in retention without it.  Outpatient follow-up with Dr. Tresa Moore.  Home Health: Patient going to skilled nursing facility Equipment/Devices:   Discharge Condition: Overall poor prognosis CODE STATUS: FULL Diet recommendation: Heart healthy diet, soft   Discharge Diagnoses:   Present on Admission: . Gross hematuria . Acute lower UTI . Acute blood loss anemia History of prostrate cancer, castrate resistant . Hypertension . CAD (coronary artery disease) . Dementia ([place]) . Acute kidney injury on CKD (chronic kidney disease), stage IV (HCC) Dementia  Consults: Urology, Dr. Tresa Moore    Allergies:   Allergies  Allergen Reactions  . Amitriptyline Hcl Other (See Comments)    REACTION: confusion  . Diphenhydramine Hcl Other (See Comments)    Keeps him awake  . Simvastatin Other (See Comments)    REACTION: leg pain  . Clonidine Derivatives     Dizziness, falls, bradycardia  . Tylenol [Acetaminophen] Other (See Comments)    Dr advised pt not to take due to finding a spot on pt's kidney     DISCHARGE MEDICATIONS: Allergies as of 06/08/2019      Reactions   Amitriptyline Hcl Other (See Comments)   REACTION: confusion   Diphenhydramine Hcl Other (See Comments)   Keeps him awake   Simvastatin Other (See Comments)   REACTION: leg pain   Clonidine Derivatives    Dizziness, falls, bradycardia   Tylenol [acetaminophen] Other (See Comments)   Dr advised pt not to take due to finding a spot on pt's kidney      Medication List    STOP taking these medications   aspirin 81 MG chewable tablet     TAKE these medications   abiraterone acetate 250 MG tablet Commonly  known as: ZYTIGA Take 1,000 mg by mouth at bedtime.   amLODipine 5 MG tablet Commonly known as: NORVASC Take 1 tablet (5 mg total) by mouth daily.   azelastine 0.05 % ophthalmic solution Commonly known as: OPTIVAR Place 1 drop into both eyes 2 (two) times daily.   citalopram 20 MG tablet Commonly known as: CELEXA Take 1 tablet (20 mg total) by mouth daily.   donepezil 5 MG tablet Commonly known as: ARICEPT TAKE 1 TABLET BY MOUTH EVERYDAY AT BEDTIME What changed: See the new instructions.   ferrous sulfate 325 (65 FE) MG tablet Take 325 mg by mouth daily.   gabapentin 300 MG capsule Commonly known as: NEURONTIN TAKE 1 CAPSULE BY MOUTH TWICE A DAY   hydrALAZINE 25 MG tablet Commonly known as: APRESOLINE TAKE 1 TABLET (25 MG TOTAL) BY MOUTH EVERY 8 (EIGHT) HOURS. What changed: See the new instructions.   isosorbide mononitrate 30 MG 24 hr tablet Commonly known as: IMDUR Take 1 tablet (30 mg total) by mouth daily.   lovastatin 40 MG tablet Commonly known as: MEVACOR Take 1 tablet (40 mg total) by mouth at bedtime.   meclizine 25 MG tablet Commonly known as: ANTIVERT Take 0.5-1 tablets (12.5-25 mg total) by mouth as needed for dizziness. TAKE 1/2 - 1 TABLET BY MOUTH THREE TIMES A DAY AS NEEDED FOR DIZINESS What changed:   when to take this  additional instructions   nitroGLYCERIN 0.4 MG SL tablet Commonly known  as: NITROSTAT Place 1 tablet (0.4 mg total) under the tongue every 5 (five) minutes as needed for chest pain.   pantoprazole 40 MG tablet Commonly known as: PROTONIX TAKE 1 TABLET BY MOUTH EVERY DAY   predniSONE 5 MG tablet Commonly known as: DELTASONE Take 5 mg by mouth at bedtime.   thiamine 50 MG tablet Commonly known as: VITAMIN B-1 Take 2 tablets (100 mg total) by mouth daily.   vitamin B-12 1000 MCG tablet Commonly known as: CYANOCOBALAMIN Take 1 tablet (1,000 mcg total) by mouth daily.   Vitamin C 500 MG Caps Take 500 mg by mouth daily.    Vitamin D (Cholecalciferol) 50 MCG (2000 UT) Caps Take 50 mcg by mouth daily.        Brief H and P: For complete details please refer to admission H and P, but in brief *78 year old, prostate cancer castrate resistant, CKD stage IV, coronary artery disease presentedto the hospital with sudden onset gross hematuria and blood loss anemia. Patient was started on three-way Foley catheter and CBI and admitted to the hospital. Followed by urology. Patient has history of hypertension. Baseline creatinine is about 2.7-2.8. Patient refused to be on aspirin and Plavix, his Plavix was stopped about 3 weeks ago. Was still on aspirin when presented to the hospital.  Patient was admitted for further work-up.   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 -Intermittent, patient was treated with CBI.  CBI was discontinued however on 10/23, patient started having hematuria again, was placed back on CBI and urology was reconsulted. -Patient was followed by urology, recommended cystoscopy if he fails a CBI -Urine culture had shown 20,000 colonies of Proteus mirabilis on 10/17, received 7 days IV antibiotics, completed course -Foley catheter was removed, patient developed urinary retention and it was placed back on 10/24 -Per urology, continue Foley catheter as he is in the retention without.  Although not ideal, this is best means of bladder management for now as he cannot self cath.  History of nonmetastatic castrate resistant prostate CA -Urology aware, Dr. Tammi Klippel considering other options, patient follows Dr. Tresa Moore urology.    PSA 52 on 03/2019.  No measurable disease on Lupron, Zytiga, prednisone. -Patient has very poor functional status and is not a candidate for aggressive surgical cytotoxic chemotherapy.  Will likely benefit from hospice transition in future based on the disease progression and functional  decline.  Acute blood loss anemia in the setting of chronic anemia -Patient received total 3 units of packed RBCs through the hospitalization.   -H&H currently stable 8.0  Acute kidney injury on CKD stage IV -Improving, patient presented with creatinine of 2.8 Creatinine plateaued at 1.9  Essential hypertension -BP stable, continue amlodipine, hydralazine  History of CAD -Used to be on Plavix, aspirin.  Plavix was discontinued as per reports, aspirin on hold, no active chest pain or shortness of breath. - will continue to hold aspirin due to recurrent hematuria.   -Continue statin, hydralazine, nitrates  History of coronary artery disease:Used to be on Plavix and aspirin. Plavix was discontinued sometime ago as per reports.No active coronary artery disease.Patient is on hydralazine and nitrates. He is on pravastatin. -Aspirin also on hold due to recurrent hematuria  Dementia: - No acute behavioral issues, pleasantly confused, oriented to self.  - Continue Celexa, Aricept  Generalized debility - PT OT, recommended skilled nursing facility.  Social work consulted.   Day of Discharge S: Foley  catheter had to be placed back due to urinary retention.  No acute issues this morning.  Cleared by urology for discharge.  BP 137/68   Pulse 63   Temp 98.2 F (36.8 C) (Oral)   Resp 13   Ht 5\' 9"  (1.753 m)   Wt 56.7 kg   SpO2 97%   BMI 18.46 kg/m   Physical Exam: General: Alert and awake oriented, has dementia. HEENT: anicteric sclera, pupils reactive to light and accommodation CVS: S1-S2 clear no murmur rubs or gallops Chest: clear to auscultation bilaterally, no wheezing rales or rhonchi Abdomen: soft nontender, nondistended, normal bowel sounds Extremities: no cyanosis, clubbing or edema noted bilaterally Neuro: No new deficits GU: Foley  The results of significant diagnostics from this hospitalization (including imaging, microbiology, ancillary and  laboratory) are listed below for reference.      Procedures/Studies:  Dg Chest 2 View  Result Date: 05/10/2019 CLINICAL DATA:  Rule out pneumonia, weakness EXAM: CHEST - 2 VIEW COMPARISON:  02/25/2019 FINDINGS: The heart size and mediastinal contours are within normal limits. Coronary stents. Aortic atherosclerosis. Both lungs are clear. Redemonstrated postoperative findings of right lower lobe wedge resection. Disc degenerative disease of the thoracic spine. IMPRESSION: No acute abnormality of the lungs. Electronically Signed   By: Eddie Candle M.D.   On: 05/10/2019 13:55   Ct Head Wo Contrast  Result Date: 05/10/2019 CLINICAL DATA:  Weakness for 2 days.  Fall yesterday. EXAM: CT HEAD WITHOUT CONTRAST TECHNIQUE: Contiguous axial images were obtained from the base of the skull through the vertex without intravenous contrast. COMPARISON:  February 25, 2019 FINDINGS: Brain: No subdural, epidural, or subarachnoid hemorrhage. Chronic white matter changes are noted. 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 Infarcts are seen in the left cerebellar hemisphere, also stable. The cerebellum is otherwise unchanged. A lacunar infarct is seen within the pons, stable. The brainstem is otherwise normal. Basal cisterns are patent. Ventricles and sulci are prominent but stable. No mass effect or midline shift. No acute cortical ischemia is identified. Vascular: Calcified atherosclerosis is seen in the intracranial carotids. Skull: Normal. Negative for fracture or focal lesion. Sinuses/Orbits: No acute finding. Other: None. IMPRESSION: Chronic white matter changes and infarcts as above. No acute intracranial abnormalities identified. Electronically Signed   By: Dorise Bullion III M.D   On: 05/10/2019 13:52   Ct Chest Wo Contrast  Result Date: 05/23/2019 CLINICAL DATA:  Non-small-cell lung cancer. Restaging. EXAM: CT CHEST WITHOUT CONTRAST TECHNIQUE: Multidetector CT imaging of the chest was  performed following the standard protocol without IV contrast. COMPARISON:  05/27/2018 FINDINGS: Cardiovascular: The heart size is normal. No substantial pericardial effusion. Coronary artery calcification is evident. Atherosclerotic calcification is noted in the wall of the thoracic aorta. Mediastinum/Nodes: No mediastinal lymphadenopathy. No evidence for gross hilar lymphadenopathy although assessment is limited by the lack of intravenous contrast on today's study. The esophagus has normal imaging features. There is no axillary lymphadenopathy. Lungs/Pleura: Centrilobular and paraseptal emphysema evident. Patient is status post right lower lobe superior segmentectomy. 4 mm right middle lobe nodule (87/7) is unchanged. Subtle 6 mm ground-glass opacity in the left lower lobe (108/7) is unchanged in the interval. No focal airspace consolidation. No pleural effusion. Upper Abdomen: Calcified gallstones again noted. Diverticular changes are seen in the abdominal segments of the colon. There is abdominal aortic atherosclerosis. Musculoskeletal: No worrisome lytic or sclerotic osseous abnormality. IMPRESSION: 1. Stable exam. No new or progressive findings to suggest recurrent or metastatic disease in  the chest. 2. Emphysema. 3. Coronary artery and thoracoabdominal aortic atherosclerosis. 4. Cholelithiasis. Aortic Atherosclerosis (ICD10-I70.0) and Emphysema (ICD10-J43.9). Electronically Signed   By: Misty Stanley M.D.   On: 05/23/2019 13:10   US Pelvis Limited (transabdominal Only)  Result Date: 05/31/2019 CLINICAL DATA:  Hematuria.  Scan for clots. EXAM: LIMITED ULTRASOUND OF PELVIS TECHNIQUE: Limited transabdominal ultrasound examination of the pelvis was performed. COMPARISON:  None. FINDINGS: The study was suboptimal as the patient began vomiting in the study was ended early. A Foley catheter is identified. The bladder wall appears markedly thickened. No anechoic urine is seen between the bladder wall and the  catheter balloon. There appears to be some heterogeneous material around the Foley catheter. IMPRESSION: The study is limited as it had to be ended early due to patient condition. There is marked bladder wall thickening. There is heterogeneous material around the Foley catheter which could represent blood products given history. A bladder mass cannot be confidently excluded on this study. Electronically Signed   By: Dorise Bullion III M.D   On: 05/31/2019 15:27   Ct Renal Stone Study  Result Date: 05/10/2019 CLINICAL DATA:  Weakness for 2 days.  Fall.  Hematuria. EXAM: CT ABDOMEN AND PELVIS WITHOUT CONTRAST TECHNIQUE: Multidetector CT imaging of the abdomen and pelvis was performed following the standard protocol without IV contrast. COMPARISON:  Multiple CT scans since January 17, 2015 FINDINGS: Lower chest: No acute abnormality. Hepatobiliary: The liver is unremarkable. Cholelithiasis is identified without wall thickening. Pancreas: Unremarkable. No pancreatic ductal dilatation or surrounding inflammatory changes. Spleen: Normal in size without focal abnormality. Adrenals/Urinary Tract: A 2 mm stone is seen in the left kidney on coronal image 66. Bilateral renal cysts are noted. There is an unusual contour to the inferior left kidney. This was described as a complex cyst on a previous study from June of 2016. This lesion demonstrates an attenuation of 31 Hounsfield units today but is much smaller when compared to June of 2016 suggesting a benign etiology. Mild hydronephrosis and ureterectasis on the right. The distal right ureter is obscured by streak artifact off hip replacements. The left ureter is normal in caliber. No hydronephrosis on the left. No perinephric stranding adjacent to either kidney. Visualized portions of the bladder are normal. Stomach/Bowel: The stomach and small bowel are normal. A portion of the sigmoid colon is obscured by streak artifact off the hip replacements. Colonic diverticulosis is  identified, particularly in the descending and sigmoid colon. The remainder of the colon is normal. No evidence of appendicitis. Vascular/Lymphatic: Dense atherosclerotic changes are seen in the nonaneurysmal aorta, iliac vessels, and femoral vessels. No adenopathy. Reproductive: Previous prostatectomy. Prostate bed is obscured by streak artifact. Other: No abdominal wall hernia or abnormality. No abdominopelvic ascites. Musculoskeletal: Bilateral hip replacements are identified. Bones are stable since April 01, 2019. IMPRESSION: 1. There is mild right hydronephrosis and ureterectasis. The distal most right ureter is obscured by streak artifact off hip replacements. The findings could represent either a stone in the distal right ureter or a recently passed stone as the finding is new when compared April 01, 2019. 2. There is an unusual contour to the inferior left kidney. A complex lesion was seen in this region on previous studies. While the finding is nonspecific on on today's study, the fact that the complex lesion seen in 2016 is significantly smaller today suggests a benign etiology. 3. 2 mm stone in the left kidney. 4. Colonic diverticulosis without diverticulitis. Atherosclerotic changes in the nonaneurysmal aorta  and branching vessels. Electronically Signed   By: Dorise Bullion III M.D   On: 05/10/2019 17:19      LAB RESULTS: Basic Metabolic Panel: Recent Labs  Lab 06/06/19 0511 06/07/19 0535  NA 140 141  K 4.3 4.6  CL 109 108  CO2 23 25  GLUCOSE 108* 106*  BUN 37* 38*  CREATININE 1.91* 1.95*  CALCIUM 8.2* 8.5*   Liver Function Tests: No results for input(s): AST, ALT, ALKPHOS, BILITOT, PROT, ALBUMIN in the last 168 hours. No results for input(s): LIPASE, AMYLASE in the last 168 hours. No results for input(s): AMMONIA in the last 168 hours. CBC: Recent Labs  Lab 06/06/19 0511 06/07/19 0535  WBC 5.0 4.8  HGB 7.8* 8.0*  HCT 25.6* 26.2*  MCV 96.2 96.3  PLT 120* 133*    Cardiac Enzymes: No results for input(s): CKTOTAL, CKMB, CKMBINDEX, TROPONINI in the last 168 hours. BNP: Invalid input(s): POCBNP CBG: No results for input(s): GLUCAP in the last 168 hours.    Disposition and Follow-up: Discharge Instructions    Diet - low sodium heart healthy   Complete by: As directed    Increase activity slowly   Complete by: As directed        DISPOSITION: [place] information for follow-up providers    Care, [facility] Follow up.   Specialty: Home Health Services Why: [facility] physical therapy Contact information: [place] [address] [phone]        Alexis Frock, MD. Schedule an appointment as soon as possible for a visit in 1 week(s).   Specialty: Urology Contact information: [place] [address] [phone]            Contact information for after-discharge care    Destination    [facility] .   Service: Skilled Nursing Contact information: [address] [place] [phone]                   Time coordinating discharge:  35 minutes  Signed:   Estill Cotta M.D. [facility] 06/08/2019, 12:24 PM

## 2019-06-08 NOTE — Progress Notes (Signed)
Subjective/Chief Complaint:   1 - High Risk Prostate Cancer - s/p radical prostatectomy 08/2000 Gleason 4+5=9  Recent Summarized Course:  2003 - [place] XRT (Dr. 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 [place] renal imaging w/o sig hydro.   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 Progressive prostate cancer as per above. On Plavix at baseline, now being held. Received transfusion x several on admit. Hgb 10/19 8.8 up from 6.8 and with 3 way catheter on irrigation. CBI slowly weaned to off and Hgb stable 10/23. Foley removed AM 10/24 but unable to void. Replaced PM 10/24 with 700cc output.   Today Victor Davis is stable. Failed voiding trial yesterday. Cr stable.    Objective: Vital signs in last 24 hours: Temp:  [98.2 F (36.8 C)-98.5 F (36.9 C)] 98.2 F (36.8 C) (10/25 0511) Pulse Rate:  [63-83] 63 (10/25 0511) Resp:  [13-16] 13 (10/25 0511) BP: (115-145)/(69-78) 145/69 (10/25 0511) SpO2:  [95 %-98 %] 97 % (10/25 0511) Weight:  [56.7 kg] 56.7 kg (10/25 0511) Last BM Date: 06/06/19  Intake/Output from previous day: 10/24 0701 - 10/25 0700 In: -  Out: 1200 [Urine:1200] Intake/Output this shift: No intake/output data recorded.   General appearance: alert, cooperative and frail, at baseline. Pleasant.  Eyes: negative Nose: Nares normal. Septum midline. Mucosa normal. No drainage or sinus tenderness. Throat: lips, mucosa, and tongue normal; teeth and gums normal Neck: supple, symmetrical, trachea midline Back: symmetric, no curvature. ROM normal. No CVA  tenderness. Resp: non-labored on room air.  Cardio: Nl rate GI: soft, non-tender; bowel sounds normal; no masses,  no organomegaly Male genitalia: normal, 3 way foley in place with clear yellow urine off irrigation again. Removed. Foreskin reduced.  Extremities: extremities normal, atraumatic, no cyanosis or edema Pulses: 2+ and symmetric Skin: Skin color, texture, turgor normal. No rashes or lesions Lymph nodes: Cervical, supraclavicular, and axillary nodes normal.  Lab Results:  Recent Labs    06/06/19 0511 06/07/19 0535  WBC 5.0 4.8  HGB 7.8* 8.0*  HCT 25.6* 26.2*  PLT 120* 133*   BMET Recent Labs    06/06/19 0511 06/07/19 0535  NA 140 141  K 4.3 4.6  CL 109 108  CO2 23 25  GLUCOSE 108* 106*  BUN 37* 38*  CREATININE 1.91* 1.95*  CALCIUM 8.2* 8.5*   PT/INR No results for input(s): LABPROT, INR in the last 72 hours. ABG No results for input(s): PHART, HCO3 in the last 72 hours.  Invalid input(s): PCO2, PO2  Studies/Results: No results found.  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 He has very poor functional status and is  not candidate for aggressive surgery or cytotoxic chemotherapy. Agree with holding blood thinners for now as he is also fall risk in addition to active bleeding.   Will likely benefit from hospice transiiton in next year or so based on current disease progression and functional decline.   Hopefully OK for DC home as anypoint, but keep foley as he is in retention without. Although not ideal, this is likely best means of bladder management for now as he cannot self-cath.   Appreciate  hospitalist service comanagment.   Alexis Frock 06/08/2019

## 2019-06-08 NOTE — Progress Notes (Signed)
Patient's wife called inquiring about patient discharge to SNF, informed her that per CSW-Ashley, she is still waiting to hear back from the facility and not sure if he will be discharge to SNF today, wife updated and will inform her with any changes.

## 2019-06-08 NOTE — Progress Notes (Signed)
Physical Therapy Treatment Patient Details Name: Victor Davis MRN: [id number] DOB: June 03, 1941 Today's Date: 06/08/2019    History of Present Illness 78 year old, prostate cancer castrate resistant, CKD stage IV, coronary artery disease presents to the hospital with sudden onset gross hematuria and blood loss anemia.    PT Comments    Pt very cooperative and progressing with mobility.  This date, pt ambulating at supervision level with RW and with min guard assist using SPC - noted increased instability with SPC but no LOB and pt also noted to be more cautious with use of SPC.  Pt reports he has RW and SPC at home and is willing to use RW but will need to use [facility] for certain parts of home with narrow doorways.  RN advised of situation and will convey to spouse the need for use of RW as much as possible.  Pt would benefit from PT assessment in own environment to further assess mobility and safety needs.  Follow Up Recommendations  Supervision for mobility/OOB;Home health PT     Equipment Recommendations  None recommended by PT    Recommendations for Other Services       Precautions / Restrictions Precautions Precautions: Fall Restrictions Weight Bearing Restrictions: No    Mobility  Bed Mobility               General bed mobility comments: Pt up in chair and requests back to same  Transfers Overall transfer level: Needs assistance Equipment used: Rolling walker (2 wheeled) Transfers: Sit to/from Stand Sit to Stand: Supervision         General transfer comment: pt self cues for use of UEs to self assist  Ambulation/Gait Ambulation/Gait assistance: Supervision;Min guard Gait Distance (Feet): 240 Feet Assistive device: Rolling walker (2 wheeled);Straight cane Gait Pattern/deviations: Step-through pattern;Decreased step length - right;Decreased step length - left;Shuffle;Trunk flexed Gait velocity: decreased   General Gait Details: Pt ambulated 120' with RW,  close supervision and min cues for direction.  Pt ambulated additional 120' with SPC , min guard assist and cues to increase BOS.  Pt demonstrating increased caution with use of SPC   Stairs             Wheelchair Mobility    Modified Rankin (Stroke Patients Only)       Balance Overall balance assessment: History of Falls;Needs assistance Sitting-balance support: Feet supported;No upper extremity supported Sitting balance-Leahy Scale: Good     Standing balance support: No upper extremity supported Standing balance-Leahy Scale: Fair                              Cognition Arousal/Alertness: Awake/alert Behavior During Therapy: WFL for tasks assessed/performed Overall Cognitive Status: No family/caregiver present to determine baseline cognitive functioning Area of Impairment: Orientation                 Orientation Level: Disoriented to;Place;Time;Situation             General Comments: Oriented to person and place. Tangential responses at times.      Exercises      General Comments        Pertinent Vitals/Pain Pain Assessment: No/denies pain    Home Living                      Prior Function            PT Goals (current goals can now be found  in the care plan section) Acute Rehab PT Goals Patient Stated Goal: home with spouse PT Goal Formulation: With patient Time For Goal Achievement: 06/16/19 Potential to Achieve Goals: Good Progress towards PT goals: Progressing toward goals    Frequency    Min 3X/week      PT Plan Discharge plan needs to be updated    Co-evaluation              AM-PAC PT "6 Clicks" Mobility   Outcome Measure  Help needed turning from your back to your side while in a flat bed without using bedrails?: None Help needed moving from lying on your back to sitting on the side of a flat bed without using bedrails?: A Little Help needed moving to and from a bed to a chair (including a  wheelchair)?: A Little Help needed standing up from a chair using your arms (e.g., wheelchair or bedside chair)?: A Little Help needed to walk in hospital room?: A Little Help needed climbing 3-5 steps with a railing? : A Little 6 Click Score: 19    End of Session Equipment Utilized During Treatment: Gait belt Activity Tolerance: Patient tolerated treatment well Patient left: in chair;with call bell/phone within reach;with chair alarm set Nurse Communication: Mobility status PT Visit Diagnosis: Unsteadiness on feet (R26.81);Difficulty in walking, not elsewhere classified (R26.2);Repeated falls (R29.6)     Time: 6734-1937 PT Time Calculation (min) (ACUTE ONLY): 14 min  Charges:  $Gait Training: 8-22 mins                     [place] Pager [phone] Office [phone]    Eiman Maret 06/08/2019, 3:07 PM

## 2019-06-08 NOTE — Progress Notes (Signed)
Patient discharged home with wife, discharge instructions given and explained to patient/wife, foley care/management demonstrated to wife and she demonstrated it back. No pressure injury noted, skin intact. Accompanied home by wife, transported to the car by staff via wheelchair.

## 2019-06-08 NOTE — TOC Progression Note (Addendum)
Transition of Care [facility]) - Progression Note    Patient Details  Name: Victor Davis MRN: [id number] Date of Birth: Jan 16, 1941  Transition of Care [facility]) CM/SW [place], LCSW Phone Number: 06/08/2019, 12:35 PM  Clinical Narrative:   CSW following patient for support and dishcrage needs. CSW unable to get a hold of admission coordinator at [facility] and [place]. Facility responsible for obtaining patient authorization through insurance. CSW unable to verify that Josem Kaufmann has been obtain. Patient unable to discharge without auth from insurance.  UPDATE:  CSW received phone call from [place] and they stated at this time they still do not have authorization from insurance. Patient will be unable to leave without authorization from insurance company     Expected Discharge Plan: [place] Barriers to Discharge: Ship broker  Expected Discharge Plan and Services Expected Discharge Plan: [place]   Discharge Planning Services: CM Consult   Living arrangements for the past 2 months: Single Family Home Expected Discharge Date: 06/08/19                                     Social Determinants of Health (SDOH) Interventions    Readmission Risk Interventions No flowsheet data found.

## 2019-06-08 NOTE — Progress Notes (Signed)
Patient's wife called back and stated the SNF facility informed her that she will not be able to vist him at the facility and she wants to take him home now. Reinforced wife of patient's need with mobility, getting in/out of care and around and foley care at home and she stated she is coming to get him. Dr. Tana Coast and [place] notified. Making plan for home health care.

## 2019-06-08 NOTE — Progress Notes (Signed)
    Home health agencies that serve [id number].        Home Health Agencies Search Results  Results List Table  Home Health Agency Information Quality of Patient Care Rating Patient Survey Summary Rating  [facility] [phone] 4 out of 5 stars 4 out of 5 stars  [facility] [phone] 3 out of 5 stars 4 out of 5 stars  [facility] [phone] 4  out of 5 stars 3 out of 5 stars  [facility] [phone] 4  out of 5 stars 4 out of 5 stars  [facility] [phone] 4 out of 5 stars 4 out of 5 stars  [facility] [phone] 4 out of 5 stars 4 out of 5 stars  [facility] [phone] 3  out of 5 stars 4 out of 5 stars  [facility] [phone] 2  out of 5 stars 3 out of 5 stars  [facility] [phone] 3 out of 5 stars 4 out of 5 stars  [facility] [phone] 4 out of 5 stars Not Available12  [facility] [phone] 3 out of 5 stars 4 out of 5 stars  [facility] [phone] Not Available5 Not Available12  [facility] [phone] 3  out of 5 stars 3 out of 5 stars  [facility] [phone] 5 out of 5 stars 4 out of 5 stars  [facility] [phone] 3  out of 5 stars 4 out of 5 stars  [facility] [phone] 3  out of 5 stars 3 out of 5 stars  [facility] [phone] 3  out of 5 stars Not Available11  [facility] [phone] 4  out of 5 stars 3 out of 5 stars   Home Health Footnotes  Footnote number Footnote as displayed on Home Health Compare  1 This agency provides services under a federal waiver program to non-traditional, chronic long term population.  2 This agency provides services to a special needs population.  3 Not Available.  4 The number of patient episodes  for this measure is too small to report.  5 This measure currently does not have data or provider has been certified/recertified for less than 6 months.  6 The national average for this measure is not provided because of state-to-state differences in data collection.  7 Medicare is not displaying rates for this measure for any home health agency, because of an issue with the data.  8 There were problems with the data and they are being corrected.  9 Zero, or very few, patients met the survey's rules for inclusion. The scores shown, if any, reflect a very small number of surveys and may not accurately tell how an agency is doing.  10 Survey results are based on less than 12 months of data.  11 Fewer than 70 patients completed the survey. Use the scores shown, if any, with caution as the number of surveys may be too low to accurately tell how an agency is doing.  12 No survey results are available for this period.  13 Data suppressed by CMS for one or more quarters.    

## 2019-06-09 ENCOUNTER — Telehealth: Payer: Self-pay | Admitting: *Deleted

## 2019-06-09 NOTE — Telephone Encounter (Signed)
Pt was on TCM report he was a admitted 05/31/19 for further work-up for sudden onset gross hematuria and blood loss anemia. 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 patient was treated with CBI.  CBI was discontinued however on 10/23, patient started having hematuria again, was placed back on CBI and urology was reconsulted. Pt D/c to SNF 06/08/19, and will follow=up w/urologist in 1 week after leaving SNF.Marland KitchenJohny Chess

## 2019-06-12 ENCOUNTER — Telehealth: Payer: Self-pay | Admitting: Internal Medicine

## 2019-06-12 NOTE — Telephone Encounter (Signed)
Called patient to schedule AWV, but no answer. Will try to call patient back at a later time. SF 

## 2019-06-13 ENCOUNTER — Telehealth: Payer: Self-pay | Admitting: Internal Medicine

## 2019-06-13 NOTE — Telephone Encounter (Signed)
Lori with [facility] went to home to admit the pt for services. But pt is living in non livable conditions.   No sheets on the bed and he was laying on the mattress.  Pt was dirty, hair stuck to all his clothing, bathroom was nasty.  Home was unsafe, unsanitary, and just plin dirty.  Cats all around, bugs, filth. Cecille Rubin states she cannot admit him for services  Cecille Rubin states she has no other choice but to call [facility]PS.  cb  [phone]

## 2019-06-19 ENCOUNTER — Other Ambulatory Visit: Payer: Self-pay | Admitting: Internal Medicine

## 2019-06-23 ENCOUNTER — Other Ambulatory Visit: Payer: Self-pay | Admitting: Internal Medicine

## 2019-06-23 DIAGNOSIS — R310 Gross hematuria: Secondary | ICD-10-CM | POA: Diagnosis not present

## 2019-06-24 ENCOUNTER — Other Ambulatory Visit: Payer: Self-pay | Admitting: Internal Medicine

## 2019-06-28 ENCOUNTER — Other Ambulatory Visit: Payer: Self-pay | Admitting: Internal Medicine

## 2019-07-02 ENCOUNTER — Other Ambulatory Visit: Payer: Self-pay

## 2019-07-02 NOTE — Patient Outreach (Signed)
[facility] Evaluation Interviewer attempted to call patient on today regarding Aging Gracefully referral. Telephone states person is not excepting any phone calls at this time.  Will attempt to call back tomorrow.  [facility] Management Assistant

## 2019-07-02 NOTE — Telephone Encounter (Signed)
This encounter was created in error - please disregard.

## 2019-07-16 ENCOUNTER — Other Ambulatory Visit: Payer: Self-pay

## 2019-07-16 NOTE — Patient Outreach (Signed)
[facility] Evaluation Interviewer attempted to call patient on today regarding Aging Gracefully referral.   Operator states phone is not a working number. Will contact [facility] in hopes of another number on file.   CMA will follow up in 1 week.  [facility] Management Assistant

## 2019-07-30 NOTE — Progress Notes (Signed)
Virtual Visit via Telephone Note   This visit type was conducted due to national recommendations for restrictions regarding the COVID-19 Pandemic (e.g. social distancing) in an effort to limit this patient's exposure and mitigate transmission in our community.  Due to his co-morbid illnesses, this patient is at least at moderate risk for complications without adequate follow up.  This format is felt to be most appropriate for this patient at this time.  The patient did not have access to video technology/had technical difficulties with video requiring transitioning to audio format only (telephone).  All issues noted in this document were discussed and addressed.  No physical exam could be performed with this format.  Please refer to the patient's chart for his  consent to telehealth for [facility].   Date:  07/31/2019   ID:  Victor Davis, DOB [birth year], MRN [id number]  Patient Location: Home Provider Location: Office  PCP:  Biagio Borg, MD  Cardiologist:  Lauree Chandler, MD  Electrophysiologist:  None   Evaluation Performed:  Follow-Up Visit  Chief Complaint:  Follow up- CAD  History of Present Illness:    Victor Davis is a 78 y.o. male with history of CAD, CVA, HTN, HLD, PAD, prostate CA, lung cancer, sinus bradycardia, anemia, CKD and dementia who is being seen today by virtual e-visit due to the [place] pandemic. Cardiac cath July 2013 with severe LAD and Circumflex disease. 2 drug eluting stents were placed in the LAD and 2 drug eluting stents were placed in the Circumflex. He has had lung cancer and prostate cancer. He has moderate carotid artery disease by dopplers July 2020. Possible syncopal event October 2016. Cardiac event monitor showed rare PVCs but no other arrhythmias and no high grade AV block. Echo October 2018 with normal LV function, mild AI, mild MR. Hospitalized in April 2019 with anemia (Hgb 6.6) felt to be due to GI bleeding. Aricept dose lowered due  to bradycardia. He was evaluated in the ED in July 2020 for weakness and found to have unsafe living conditions. He was sent to a SNF but left. He was seen in our office in September 2020 with his wife by Melina Copa, PA-C and was covered in bedbugs. His wife knew about the bedbugs at home but they did not have the money to call anyone to [place]. He refused to go to the ED that day. Melina Copa, PA attempted to file a report with [facility]PS but was told there was nothing that could be done. He remains on Plavix with history of stroke. 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   The patient denies chest pain, dyspnea, palpitations, dizziness, near syncope or syncope. No lower extremity edema. His wife also tells me that he has been doing well.   The patient does not have symptoms concerning for COVID-19 infection (fever, chills, cough, or new shortness of breath).    Past Medical History:  Diagnosis Date  . Anemia, unspecified   . Cancer ([place])    lung and prostate cancer per wife  . Carotid artery disease ([place])   . CKD (chronic kidney disease), stage IV ([place])   . Coronary artery disease    a. DES to LAD and Cx 02/2012.  [name] Dementia ([place])   . Dysuria 10/10/2010  . Emphysema 04/01/2012   By CT chest, august 2013  . ERECTILE DYSFUNCTION 04/05/2007  . GERD (gastroesophageal reflux disease)   .  GI bleeding   . HYPERLIPIDEMIA 07/30/2008  . HYPERSOMNIA 10/02/2007  . Impaired glucose tolerance 02/09/2011  . INSOMNIA-SLEEP DISORDER-UNSPEC 10/22/2009  . LOW BACK PAIN 04/05/2007  . OSTEOARTHRITIS 04/05/2007  . PEPTIC ULCER DISEASE 04/05/2007  . PERIPHERAL EDEMA 10/22/2009  . PROSTATE CANCER, HX OF 04/05/2007  . RESTLESS LEG SYNDROME 05/15/2007  . RHINITIS, ALLERGIC NOS 05/15/2007  . Sinus bradycardia 10/24/2010   a. prior h/o HR 40s, clonidine stopped 05/2017 due to this.  . Stroke (cerebrum) ([place])   . Unspecified visual loss 07/15/2008    . UTI 10/24/2010   Past Surgical History:  Procedure Laterality Date  . CARDIAC CATHETERIZATION    . COLONOSCOPY N/A 11/28/2017   Procedure: COLONOSCOPY;  Surgeon: Yetta Flock, MD;  Location: [facility];  Service: Gastroenterology;  Laterality: N/A;  . ESOPHAGOGASTRODUODENOSCOPY N/A 11/28/2017   Procedure: ESOPHAGOGASTRODUODENOSCOPY (EGD);  Surgeon: Yetta Flock, MD;  Location: [facility];  Service: Gastroenterology;  Laterality: N/A;  . EYE SURGERY     right  . Inguinal herniorrhapy left  2002   x 2  . Left hip replacement    . LYMPH NODE DISSECTION Right 03/22/2015   Procedure: LYMPH NODE DISSECTION;  Surgeon: Melrose Nakayama, MD;  Location: [place];  Service: Thoracic;  Laterality: Right;  . PERCUTANEOUS CORONARY STENT INTERVENTION (PCI-S) N/A 03/08/2012   Procedure: PERCUTANEOUS CORONARY STENT INTERVENTION (PCI-S);  Surgeon: Burnell Blanks, MD;  Location: [facility] CATH LAB;  Service: Cardiovascular;  Laterality: N/A;  . PROSTATECTOMY  2002   radical  . Right hip replacement  09/22/09  . ROTATOR CUFF REPAIR     left  . s/p ventral surgery  2009  . SEGMENTECOMY Right 03/22/2015   Procedure: RIGHT LOWER LOBE SUPERIOR SEGMENTECTOMY;  Surgeon: Melrose Nakayama, MD;  Location: [facility];  Service: Thoracic;  Laterality: Right;  [name] VIDEO ASSISTED THORACOSCOPY Right 03/22/2015   Procedure: RIGHT VIDEO ASSISTED THORACOSCOPY;  Surgeon: Melrose Nakayama, MD;  Location: [facility];  Service: Thoracic;  Laterality: Right;     Current Meds  Medication Sig  . abiraterone acetate (ZYTIGA) 250 MG tablet Take 1,000 mg by mouth at bedtime.  [name] amLODipine (NORVASC) 5 MG tablet Take 1 tablet (5 mg total) by mouth daily.  . Ascorbic Acid (VITAMIN C) 500 MG CAPS Take 500 mg by mouth daily.   [name] azelastine (OPTIVAR) 0.05 % ophthalmic solution Place 1 drop into both eyes 2 (two) times daily.  . citalopram (CELEXA) 20 MG tablet TAKE 1 TABLET BY MOUTH EVERY DAY  . donepezil (ARICEPT) 5 MG  tablet TAKE 1 TABLET BY MOUTH EVERYDAY AT BEDTIME  . ferrous sulfate 325 (65 FE) MG tablet Take 325 mg by mouth daily.   [name] gabapentin (NEURONTIN) 300 MG capsule TAKE 1 CAPSULE BY MOUTH TWICE A DAY  . hydrALAZINE (APRESOLINE) 25 MG tablet TAKE 1 TABLET (25 MG TOTAL) BY MOUTH EVERY 8 (EIGHT) HOURS.  . isosorbide mononitrate (IMDUR) 30 MG 24 hr tablet Take 1 tablet (30 mg total) by mouth daily.  [name] lovastatin (MEVACOR) 40 MG tablet Take 1 tablet (40 mg total) by mouth at bedtime.  . meclizine (ANTIVERT) 25 MG tablet Take 0.5-1 tablets (12.5-25 mg total) by mouth as needed for dizziness. TAKE 1/2 - 1 TABLET BY MOUTH THREE TIMES A DAY AS NEEDED FOR DIZINESS  . nitroGLYCERIN (NITROSTAT) 0.4 MG SL tablet Place 1 tablet (0.4 mg total) under the tongue every 5 (five) minutes as needed for chest pain.  . pantoprazole (PROTONIX) 40  MG tablet TAKE 1 TABLET BY MOUTH EVERY DAY  . predniSONE (DELTASONE) 5 MG tablet Take 5 mg by mouth at bedtime.   . thiamine (VITAMIN B-1) 50 MG tablet TAKE 1 TABLET BY MOUTH EVERY DAY  . vitamin B-12 (CYANOCOBALAMIN) 1000 MCG tablet Take 1 tablet (1,000 mcg total) by mouth daily.  . Vitamin D, Cholecalciferol, 50 MCG (2000 UT) CAPS Take 50 mcg by mouth daily.     Allergies:   Amitriptyline hcl, Diphenhydramine hcl, Simvastatin, Clonidine derivatives, and Tylenol [acetaminophen]   Social History   Tobacco Use  . Smoking status: Former Smoker    Packs/day: 1.00    Years: 15.00    Pack years: 15.00    Types: Cigarettes    Quit date: 02/05/1977    Years since quitting: 42.5  . Smokeless tobacco: Never Used  Substance Use Topics  . Alcohol use: No    Alcohol/week: 1.0 standard drinks    Types: 1 Standard drinks or equivalent per week    Comment: RARE  . Drug use: No     Family Hx: The patient's family history includes Heart attack (age of onset: 91) in his father; Heart attack (age of onset: 61) in his brother; Lung cancer in his sister. There is no history of Colon  cancer.  ROS:   Please see the history of present illness.    All other systems reviewed and are negative.   Prior CV studies:   The following studies were reviewed today:   Echo October 2018: - Left ventricle: The cavity size was normal. Wall thickness was   increased in a pattern of mild LVH. Systolic function was normal.   The estimated ejection fraction was in the range of 60% to 65%.   Wall motion was normal; there were no regional wall motion   abnormalities. - Aortic valve: There was mild regurgitation. - Mitral valve: Calcified annulus. There was mild regurgitation.   Labs/Other Tests and Data Reviewed:    EKG:  No ECG reviewed.  Recent Labs: 02/24/2019: TSH 2.840 05/10/2019: Magnesium 2.7 06/01/2019: ALT 7 06/07/2019: BUN 38; Creatinine, Ser 1.95; Hemoglobin 8.0; Platelets 133; Potassium 4.6; Sodium 141   Recent Lipid Panel Lab Results  Component Value Date/Time   CHOL 78 12/24/2018 11:39 AM   TRIG 99.0 12/24/2018 11:39 AM   HDL 29.90 (L) 12/24/2018 11:39 AM   CHOLHDL 3 12/24/2018 11:39 AM   LDLCALC 28 12/24/2018 11:39 AM   LDLDIRECT 104.9 02/10/2011 12:24 PM    Wt Readings from Last 3 Encounters:  07/31/19 125 lb (56.7 kg)  06/08/19 125 lb (56.7 kg)  05/29/19 128 lb (58.1 kg)     Objective:    Vital Signs:  Ht 5\' 9"  (1.753 m)   Wt 125 lb (56.7 kg)   BMI 18.46 kg/m    Pt unable to provide vital signs.  Phone visit so no exam  ASSESSMENT & PLAN:    1. CAD without angina: He has no chest pain. Continue statin and Imdur. He is not on a beta blocker due to bradycardia. Not on ASA or Plavix due to hematuria. He does not wish to restart.    2. HTN: BP cannot be checked today  3. HLD: LDL at goal. Continue statin  4. Carotid artery disease: Stable bilateral mild to moderate carotid disease by dopplers July 2020.   5. Social issues: Numerous phone notes about his living conditions. He and his wife try very hard but have limited resources. No  intentional neglect  by his wife. Primary care is aware of this. Cardiology will not be involved further with his social issues.    COVID-19 Education: The signs and symptoms of COVID-19 were discussed with the patient and how to seek care for testing (follow up with PCP or arrange E-visit).  The importance of social distancing was discussed today.  Time:   Today, I have spent 15 minutes with the patient with telehealth technology discussing the above problems.     Medication Adjustments/Labs and Tests Ordered: Current medicines are reviewed at length with the patient today.  Concerns regarding medicines are outlined above.   Tests Ordered: No orders of the defined types were placed in this encounter.   Medication Changes: No orders of the defined types were placed in this encounter.   Disposition:  Follow up in 12 month(s)  Signed, Lauree Chandler, MD  07/31/2019 10:30 AM      Current medicines are reviewed at length with the patient today.  The patient does not have concerns regarding medicines.  The following changes have been made:  no change  Labs/ tests ordered today include:  No orders of the defined types were placed in this encounter.  Disposition:   FU with me in 12 months   Signed, Lauree Chandler, MD 07/31/2019 10:30 AM    [facility] [address], [place], [place]  [address] Phone: [phone]; Fax: [phone]

## 2019-07-31 ENCOUNTER — Encounter: Payer: Self-pay | Admitting: Cardiovascular Disease

## 2019-07-31 ENCOUNTER — Other Ambulatory Visit: Payer: Self-pay

## 2019-07-31 ENCOUNTER — Telehealth (INDEPENDENT_AMBULATORY_CARE_PROVIDER_SITE_OTHER): Payer: Medicare Other | Admitting: Cardiovascular Disease

## 2019-07-31 ENCOUNTER — Encounter: Payer: Self-pay | Admitting: *Deleted

## 2019-07-31 VITALS — Ht 69.0 in | Wt 125.0 lb

## 2019-07-31 DIAGNOSIS — I6523 Occlusion and stenosis of bilateral carotid arteries: Secondary | ICD-10-CM

## 2019-07-31 DIAGNOSIS — E7800 Pure hypercholesterolemia, unspecified: Secondary | ICD-10-CM | POA: Diagnosis not present

## 2019-07-31 DIAGNOSIS — I10 Essential (primary) hypertension: Secondary | ICD-10-CM | POA: Diagnosis not present

## 2019-07-31 DIAGNOSIS — I2510 Atherosclerotic heart disease of native coronary artery without angina pectoris: Secondary | ICD-10-CM

## 2019-07-31 NOTE — Progress Notes (Signed)

## 2019-07-31 NOTE — Patient Instructions (Signed)

## 2019-08-01 ENCOUNTER — Other Ambulatory Visit: Payer: Self-pay

## 2019-08-01 NOTE — Patient Outreach (Signed)
[facility] Evaluation Interviewer attempted to call patient on today regarding Aging Gracefully referral. Patient's wife answered and CMA successfully scheduled initial interview for 08/12/2019.  [facility] Management Assistant

## 2019-08-12 ENCOUNTER — Other Ambulatory Visit: Payer: Self-pay

## 2019-08-12 NOTE — Patient Outreach (Signed)
[facility] Evaluation Interviewer made contact with patient. Aging Gracefully survey completed.   Interviewer will send referral to OT and Tomasa Rand, RN for follow up.   [facility] Management Assistant

## 2019-08-20 ENCOUNTER — Other Ambulatory Visit: Payer: Self-pay | Admitting: Specialist

## 2019-08-26 ENCOUNTER — Encounter: Payer: Medicare Other | Admitting: Thoracic Surgery (Cardiothoracic Vascular Surgery)

## 2019-08-27 ENCOUNTER — Encounter: Payer: Self-pay | Admitting: Thoracic Surgery (Cardiothoracic Vascular Surgery)

## 2019-09-05 ENCOUNTER — Other Ambulatory Visit: Payer: Self-pay | Admitting: Internal Medicine

## 2019-09-14 ENCOUNTER — Other Ambulatory Visit: Payer: Self-pay | Admitting: Internal Medicine

## 2019-09-15 ENCOUNTER — Other Ambulatory Visit: Payer: Self-pay | Admitting: Internal Medicine

## 2019-09-22 ENCOUNTER — Encounter: Payer: Self-pay | Admitting: Neurology

## 2019-09-22 ENCOUNTER — Ambulatory Visit: Payer: Medicare Other | Admitting: Neurology

## 2019-09-22 ENCOUNTER — Other Ambulatory Visit: Payer: Self-pay | Admitting: Internal Medicine

## 2019-09-24 ENCOUNTER — Telehealth: Payer: Self-pay

## 2019-09-24 NOTE — Telephone Encounter (Signed)
Patient no show for appt on 09/22/2019.

## 2019-11-16 ENCOUNTER — Emergency Department (HOSPITAL_COMMUNITY): Payer: Medicare Other

## 2019-11-16 ENCOUNTER — Emergency Department (HOSPITAL_COMMUNITY)
Admission: EM | Admit: 2019-11-16 | Discharge: 2019-11-17 | Disposition: A | Discharge status: Home / Self Care | Payer: Medicare Other | Attending: Emergency Medicine | Admitting: Emergency Medicine

## 2019-11-16 DIAGNOSIS — N184 Chronic kidney disease, stage 4 (severe): Secondary | ICD-10-CM | POA: Insufficient documentation

## 2019-11-16 DIAGNOSIS — Z8546 Personal history of malignant neoplasm of prostate: Secondary | ICD-10-CM | POA: Insufficient documentation

## 2019-11-16 DIAGNOSIS — Z20822 Contact with and (suspected) exposure to covid-19: Secondary | ICD-10-CM | POA: Diagnosis not present

## 2019-11-16 DIAGNOSIS — I2510 Atherosclerotic heart disease of native coronary artery without angina pectoris: Secondary | ICD-10-CM | POA: Insufficient documentation

## 2019-11-16 DIAGNOSIS — R0902 Hypoxemia: Secondary | ICD-10-CM | POA: Diagnosis not present

## 2019-11-16 DIAGNOSIS — I129 Hypertensive chronic kidney disease with stage 1 through stage 4 chronic kidney disease, or unspecified chronic kidney disease: Secondary | ICD-10-CM | POA: Insufficient documentation

## 2019-11-16 DIAGNOSIS — Z8673 Personal history of transient ischemic attack (TIA), and cerebral infarction without residual deficits: Secondary | ICD-10-CM | POA: Diagnosis not present

## 2019-11-16 DIAGNOSIS — E860 Dehydration: Secondary | ICD-10-CM | POA: Insufficient documentation

## 2019-11-16 DIAGNOSIS — R404 Transient alteration of awareness: Secondary | ICD-10-CM | POA: Diagnosis not present

## 2019-11-16 DIAGNOSIS — R531 Weakness: Secondary | ICD-10-CM | POA: Diagnosis present

## 2019-11-16 DIAGNOSIS — Z743 Need for continuous supervision: Secondary | ICD-10-CM | POA: Diagnosis not present

## 2019-11-16 DIAGNOSIS — Z85118 Personal history of other malignant neoplasm of bronchus and lung: Secondary | ICD-10-CM | POA: Insufficient documentation

## 2019-11-16 DIAGNOSIS — F0390 Unspecified dementia without behavioral disturbance: Secondary | ICD-10-CM | POA: Insufficient documentation

## 2019-11-16 DIAGNOSIS — R05 Cough: Secondary | ICD-10-CM | POA: Diagnosis not present

## 2019-11-16 DIAGNOSIS — I959 Hypotension, unspecified: Secondary | ICD-10-CM | POA: Diagnosis not present

## 2019-11-16 DIAGNOSIS — Z87891 Personal history of nicotine dependence: Secondary | ICD-10-CM | POA: Diagnosis not present

## 2019-11-16 DIAGNOSIS — N179 Acute kidney failure, unspecified: Secondary | ICD-10-CM

## 2019-11-16 DIAGNOSIS — Z03818 Encounter for observation for suspected exposure to other biological agents ruled out: Secondary | ICD-10-CM | POA: Diagnosis not present

## 2019-11-16 LAB — CBC WITH DIFFERENTIAL/PLATELET
Abs Immature Granulocytes: 0.04 10*3/uL (ref 0.00–0.07)
Basophils Absolute: 0 10*3/uL (ref 0.0–0.1)
Basophils Relative: 0 %
Eosinophils Absolute: 0 10*3/uL (ref 0.0–0.5)
Eosinophils Relative: 0 %
HCT: 23.5 % — ABNORMAL LOW (ref 39.0–52.0)
Hemoglobin: 7.3 g/dL — ABNORMAL LOW (ref 13.0–17.0)
Immature Granulocytes: 1 %
Lymphocytes Relative: 6 %
Lymphs Abs: 0.4 10*3/uL — ABNORMAL LOW (ref 0.7–4.0)
MCH: 29.6 pg (ref 26.0–34.0)
MCHC: 31.1 g/dL (ref 30.0–36.0)
MCV: 95.1 fL (ref 80.0–100.0)
Monocytes Absolute: 0.4 10*3/uL (ref 0.1–1.0)
Monocytes Relative: 6 %
Neutro Abs: 5.7 10*3/uL (ref 1.7–7.7)
Neutrophils Relative %: 87 %
Platelets: 95 10*3/uL — ABNORMAL LOW (ref 150–400)
RBC: 2.47 MIL/uL — ABNORMAL LOW (ref 4.22–5.81)
RDW: 13.7 % (ref 11.5–15.5)
WBC: 6.5 10*3/uL (ref 4.0–10.5)
nRBC: 0 % (ref 0.0–0.2)

## 2019-11-16 LAB — URINALYSIS, ROUTINE W REFLEX MICROSCOPIC
Bacteria, UA: NONE SEEN
Bilirubin Urine: NEGATIVE
Glucose, UA: NEGATIVE mg/dL
Hgb urine dipstick: NEGATIVE
Ketones, ur: NEGATIVE mg/dL
Nitrite: NEGATIVE
Protein, ur: >=300 mg/dL — AB
Specific Gravity, Urine: 1.013 (ref 1.005–1.030)
pH: 7 (ref 5.0–8.0)

## 2019-11-16 LAB — CBG MONITORING, ED: Glucose-Capillary: 196 mg/dL — ABNORMAL HIGH (ref 70–99)

## 2019-11-16 LAB — BASIC METABOLIC PANEL
Anion gap: 12 (ref 5–15)
BUN: 50 mg/dL — ABNORMAL HIGH (ref 8–23)
CO2: 23 mmol/L (ref 22–32)
Calcium: 8.5 mg/dL — ABNORMAL LOW (ref 8.9–10.3)
Chloride: 103 mmol/L (ref 98–111)
Creatinine, Ser: 4.36 mg/dL — ABNORMAL HIGH (ref 0.61–1.24)
GFR calc Af Amer: 14 mL/min — ABNORMAL LOW (ref >60)
GFR calc non Af Amer: 12 mL/min — ABNORMAL LOW (ref >60)
Glucose, Bld: 207 mg/dL — ABNORMAL HIGH (ref 70–99)
Potassium: 4.4 mmol/L (ref 3.5–5.1)
Sodium: 138 mmol/L (ref 135–145)

## 2019-11-16 LAB — SARS CORONAVIRUS 2 (TAT 6-24 HRS): SARS Coronavirus 2: NEGATIVE

## 2019-11-16 MED ORDER — LACTATED RINGERS IV BOLUS
1000.0000 mL | Freq: Once | INTRAVENOUS | Status: AC
  Administered 2019-11-16: 1000 mL via INTRAVENOUS

## 2019-11-16 NOTE — ED Notes (Signed)
Pt able to ambulate with shuffling gait independently. Pt wife states his gait looks baseline during this activity.

## 2019-11-16 NOTE — ED Triage Notes (Signed)
Pt presents from home with wife via EMS for AMS, decreased ability to ambulate independently starting today. Called by [employer] when pt was having trouble moving car in driveway.   EMS exam: pupils pinpoint (denies narcotics at home), lungs clear, 80bpm, 116/54, 98% RA, hard of hearing, oriented to self and place only. 35 G L forearm  H/o UTI admission in Jan 2021, poor intake since

## 2019-11-16 NOTE — ED Notes (Signed)
Pt and wife aware of need for urine sample, states usually incontinent at home, male external cath applied, educated pt about hygiene for preventing UTIs after noting wet toilet tissue in depends.

## 2019-11-17 DIAGNOSIS — R310 Gross hematuria: Secondary | ICD-10-CM | POA: Diagnosis not present

## 2019-11-17 NOTE — ED Provider Notes (Signed)
[facility] Provider Note   CSN: [id number] Arrival date & time: 11/16/19  1449     History Chief Complaint  Patient presents with  . Altered Mental Status    Victor Davis is a 79 y.o. male.  HPI   78 year old male with generalized weakness.  History supplemented by his wife at bedside.  She reports that he had some nausea and vomited yesterday.  He has been drinking since although although not much.  Today he seemed weak and was having some difficulty walking because of this.  No confusion.  Patient states that he just feels weak/tired.  Denies any focality.  Denies any acute pain.  No dyspnea.  No fevers or chills.  Past Medical History:  Diagnosis Date  . Anemia, unspecified   . Cancer ([place])    lung and prostate cancer per wife  . Carotid artery disease ([place])   . CKD (chronic kidney disease), stage IV ([place])   . Coronary artery disease    a. DES to LAD and Cx 02/2012.  [name] Dementia ([place])   . Dysuria 10/10/2010  . Emphysema 04/01/2012   By CT chest, august 2013  . ERECTILE DYSFUNCTION 04/05/2007  . GERD (gastroesophageal reflux disease)   . GI bleeding   . HYPERLIPIDEMIA 07/30/2008  . HYPERSOMNIA 10/02/2007  . Impaired glucose tolerance 02/09/2011  . INSOMNIA-SLEEP DISORDER-UNSPEC 10/22/2009  . LOW BACK PAIN 04/05/2007  . OSTEOARTHRITIS 04/05/2007  . PEPTIC ULCER DISEASE 04/05/2007  . PERIPHERAL EDEMA 10/22/2009  . PROSTATE CANCER, HX OF 04/05/2007  . RESTLESS LEG SYNDROME 05/15/2007  . RHINITIS, ALLERGIC NOS 05/15/2007  . Sinus bradycardia 10/24/2010   a. prior h/o HR 40s, clonidine stopped 05/2017 due to this.  . Stroke (cerebrum) ([place])   . Unspecified visual loss 07/15/2008  . UTI 10/24/2010    Patient Active Problem List   Diagnosis Date Noted  . Gross hematuria 05/31/2019  . Acute blood loss anemia 05/31/2019  . Failure to thrive in adult 05/10/2019  . Allergic conjunctivitis 02/24/2019  . Gait disorder 12/24/2018  . Weight loss  12/24/2018  . Thrombocytopenia ([place]) 05/28/2018  . Arm bruise 04/01/2018  . Carotid stenosis 01/28/2018  . Symptomatic anemia 11/26/2017  . Depression 06/20/2017  . Bradycardia/Falls 06/08/2017  . Head injury without skull fracture/S/p Fall with Lt Frontal Laceration 06/08/2017  . Sinus bradycardia 06/08/2017  . B12 deficiency 04/11/2017  . CKD (chronic kidney disease), stage III ([place]) 02/04/2016  . Snoring 01/21/2016  . Dementia ([place]) 10/08/2015  . Multiple bruises 10/08/2015  . Non-small cell carcinoma of right lung, stage 1 ([place]) 04/29/2015  . Lung nodule 03/22/2015  . Cholecystostomy drain infection ([place]) 01/25/2015  . Abdominal pain 01/18/2015  . Cholecystitis 01/18/2015  . Acute on chronic kidney failure ([place]) 01/18/2015  . Right upper quadrant pain   . Acute cholecystitis   . Renal cyst 12/31/2012  . Hearing loss 07/05/2012  . Lesion of nose 07/05/2012  . Anemia, unspecified 04/02/2012  . Pulmonary emphysema ([place]) 04/01/2012  . Hypertension 03/09/2012  . Acute lower UTI 03/09/2012  . Pulmonary nodule, right 02/08/2012  . Mitral regurgitation 02/06/2012  . CAD (coronary artery disease) 01/04/2012  . Unspecified disorder of liver 12/18/2011  . Hyperthyroidism 12/18/2011  . Nonspecific abnormal findings on radiological and examination of lung field 12/18/2011  . Impaired glucose tolerance 02/09/2011  . Encounter for well adult exam with abnormal findings 02/09/2011  . INSOMNIA-SLEEP DISORDER-UNSPEC 10/22/2009  . PERIPHERAL EDEMA 10/22/2009  . Hyperlipidemia 07/30/2008  .  Peripheral vascular disease ([place]) 07/30/2008  . 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 Unspecified visual loss 07/15/2008  . SHOULDER PAIN, LEFT 01/22/2008  . Hypersomnia 10/02/2007  . RESTLESS LEG SYNDROME 05/15/2007  . RHINITIS, ALLERGIC NOS 05/15/2007  . ERECTILE DYSFUNCTION 04/05/2007  . PEPTIC ULCER DISEASE 04/05/2007  . Osteoarthritis 04/05/2007  . LOW BACK PAIN 04/05/2007  . PROSTATE  CANCER, HX OF 04/05/2007    Past Surgical History:  Procedure Laterality Date  . CARDIAC CATHETERIZATION    . COLONOSCOPY N/A 11/28/2017   Procedure: COLONOSCOPY;  Surgeon: Yetta Flock, MD;  Location: [facility];  Service: Gastroenterology;  Laterality: N/A;  . ESOPHAGOGASTRODUODENOSCOPY N/A 11/28/2017   Procedure: ESOPHAGOGASTRODUODENOSCOPY (EGD);  Surgeon: Yetta Flock, MD;  Location: [facility];  Service: Gastroenterology;  Laterality: N/A;  . EYE SURGERY     right  . Inguinal herniorrhapy left  2002   x 2  . Left hip replacement    . LYMPH NODE DISSECTION Right 03/22/2015   Procedure: LYMPH NODE DISSECTION;  Surgeon: Melrose Nakayama, MD;  Location: [facility];  Service: Thoracic;  Laterality: Right;  . PERCUTANEOUS CORONARY STENT INTERVENTION (PCI-S) N/A 03/08/2012   Procedure: PERCUTANEOUS CORONARY STENT INTERVENTION (PCI-S);  Surgeon: Burnell Blanks, MD;  Location: [facility] CATH LAB;  Service: Cardiovascular;  Laterality: N/A;  . PROSTATECTOMY  2002   radical  . Right hip replacement  09/22/09  . ROTATOR CUFF REPAIR     left  . s/p ventral surgery  2009  . SEGMENTECOMY Right 03/22/2015   Procedure: RIGHT LOWER LOBE SUPERIOR SEGMENTECTOMY;  Surgeon: Melrose Nakayama, MD;  Location: [facility];  Service: Thoracic;  Laterality: Right;  [name] VIDEO ASSISTED THORACOSCOPY Right 03/22/2015   Procedure: RIGHT VIDEO ASSISTED THORACOSCOPY;  Surgeon: Melrose Nakayama, MD;  Location: [facility];  Service: Thoracic;  Laterality: Right;       Family History  Problem Relation Age of Onset  . Heart attack Father 51       Smoker  . Heart attack Brother 64  . Lung cancer Sister   . Colon cancer Neg Hx     Social History   Tobacco Use  . Smoking status: Former Smoker    Packs/day: 1.00    Years: 15.00    Pack years: 15.00    Types: Cigarettes    Quit date: 02/05/1977    Years since quitting: 42.8  . Smokeless tobacco: Never Used  Substance Use Topics  . Alcohol use: No     Alcohol/week: 1.0 standard drinks    Types: 1 Standard drinks or equivalent per week    Comment: RARE  . Drug use: No    Home Medications Prior to Admission medications   Medication Sig Start Date End Date Taking? Authorizing Provider  abiraterone acetate (ZYTIGA) 250 MG tablet Take 1,000 mg by mouth at bedtime. 05/12/19   [provider]  amLODipine (NORVASC) 10 MG tablet Take 1 tablet (10 mg total) by mouth daily. Annual appt due in May must see provider for future refills 09/15/19   Biagio Borg, MD  amLODipine (NORVASC) 5 MG tablet Take 1 tablet (5 mg total) by mouth daily. 02/24/19 02/24/20  Biagio Borg, MD  Ascorbic Acid (VITAMIN C) 500 MG CAPS Take 500 mg by mouth daily.     [provider]  azelastine (OPTIVAR) 0.05 % ophthalmic solution Place 1 drop into both eyes 2 (two) times daily. 02/24/19   Biagio Borg, MD  citalopram (CELEXA) 20 MG tablet  TAKE 1 TABLET BY MOUTH EVERY DAY 06/24/19   Biagio Borg, MD  donepezil (ARICEPT) 5 MG tablet Take 1 tablet (5 mg total) by mouth at bedtime. Annual appt due in May must see provider for future refills 09/15/19   Biagio Borg, MD  ferrous sulfate 325 (65 FE) MG tablet Take 325 mg by mouth daily.  04/04/18   [provider]  gabapentin (NEURONTIN) 300 MG capsule TAKE 1 CAPSULE BY MOUTH TWICE A DAY 06/30/19   Biagio Borg, MD  hydrALAZINE (APRESOLINE) 25 MG tablet TAKE 1 TABLET (25 MG TOTAL) BY MOUTH EVERY 8 (EIGHT) HOURS. 05/27/19   Biagio Borg, MD  isosorbide mononitrate (IMDUR) 30 MG 24 hr tablet Take 1 tablet (30 mg total) by mouth daily. Keep appt that scheduled for April for future refills 09/22/19   Biagio Borg, MD  lovastatin (MEVACOR) 40 MG tablet Take 1 tablet (40 mg total) by mouth at bedtime. 05/29/19   Biagio Borg, MD  meclizine (ANTIVERT) 25 MG tablet Take 0.5-1 tablets (12.5-25 mg total) by mouth as needed for dizziness. TAKE 1/2 - 1 TABLET BY MOUTH THREE TIMES A DAY AS NEEDED FOR DIZINESS 02/24/19    Biagio Borg, MD  nitroGLYCERIN (NITROSTAT) 0.4 MG SL tablet Place 1 tablet (0.4 mg total) under the tongue every 5 (five) minutes as needed for chest pain. 07/29/18   Burnell Blanks, MD  pantoprazole (PROTONIX) 40 MG tablet TAKE 1 TABLET BY MOUTH EVERY DAY 06/24/19   Biagio Borg, MD  pramipexole (MIRAPEX) 0.5 MG tablet Take 1 tablet (0.5 mg total) by mouth 3 (three) times daily. Annual appt due in May must see provider for future refills 09/05/19   Biagio Borg, MD  predniSONE (DELTASONE) 5 MG tablet Take 5 mg by mouth at bedtime.     [provider]  Thiamine HCl (VITAMIN B1) 50 MG TABS Take 1 tablet by mouth daily. Annual appt due in May must see provider for future refills 09/15/19   Biagio Borg, MD  vitamin B-12 (CYANOCOBALAMIN) 1000 MCG tablet Take 1 tablet (1,000 mcg total) by mouth daily. 05/29/19   Biagio Borg, MD  Vitamin D, Cholecalciferol, 50 MCG (2000 UT) CAPS Take 50 mcg by mouth daily.    [provider]    Allergies    Amitriptyline hcl, Diphenhydramine hcl, Simvastatin, Clonidine derivatives, and Tylenol [acetaminophen]  Review of Systems   Review of Systems All systems reviewed and negative, other than as noted in HPI.  Physical Exam Updated Vital Signs BP (!) 164/76   Pulse 81   Temp 99.2 F (37.3 C) (Oral)   Resp 18   Ht 5\' 5"  (1.651 m)   SpO2 94%   BMI 20.80 kg/m   Physical Exam Vitals and nursing note reviewed.  Constitutional:      General: He is not in acute distress.    Appearance: He is well-developed.  HENT:     Head: Normocephalic and atraumatic.  Eyes:     General:        Right eye: No discharge.        Left eye: No discharge.     Conjunctiva/sclera: Conjunctivae normal.  Cardiovascular:     Rate and Rhythm: Normal rate and regular rhythm.     Heart sounds: Normal heart sounds. No murmur. No friction rub. No gallop.   Pulmonary:     Effort: Pulmonary effort is normal. No respiratory distress.  Breath sounds:  Normal breath sounds.  Abdominal:     General: There is no distension.     Palpations: Abdomen is soft.     Tenderness: There is no abdominal tenderness.  Musculoskeletal:        General: No tenderness.     Cervical back: Neck supple.  Skin:    General: Skin is warm and dry.  Neurological:     Mental Status: He is alert.  Psychiatric:        Behavior: Behavior normal.        Thought Content: Thought content normal.     ED Results / Procedures / Treatments   Labs (all labs ordered are listed, but only abnormal results are displayed) Labs Reviewed  CBC WITH DIFFERENTIAL/PLATELET - Abnormal; Notable for the following components:      Result Value   RBC 2.47 (*)    Hemoglobin 7.3 (*)    HCT 23.5 (*)    Platelets 95 (*)    Lymphs Abs 0.4 (*)    All other components within normal limits  BASIC METABOLIC PANEL - Abnormal; Notable for the following components:   Glucose, Bld 207 (*)    BUN 50 (*)    Creatinine, Ser 4.36 (*)    Calcium 8.5 (*)    GFR calc non Af Amer 12 (*)    GFR calc Af Amer 14 (*)    All other components within normal limits  URINALYSIS, ROUTINE W REFLEX MICROSCOPIC - Abnormal; Notable for the following components:   APPearance HAZY (*)    Protein, ur >=300 (*)    Leukocytes,Ua MODERATE (*)    All other components within normal limits  CBG MONITORING, ED - Abnormal; Notable for the following components:   Glucose-Capillary 196 (*)    All other components within normal limits  SARS CORONAVIRUS 2 (TAT 6-24 HRS)  URINE CULTURE    EKG EKG Interpretation  Date/Time:  Sunday November 16 2019 14:53:26 EDT Ventricular Rate:  75 PR Interval:    QRS Duration: 97 QT Interval:  412 QTC Calculation: 458 R Axis:   30 Text Interpretation: Sinus rhythm Probable anteroseptal infarct, old Confirmed by ,  ([id number]) on 11/16/2019 7:52:06 PM   Radiology DG Chest Portable 1 View  Result Date: 11/16/2019 CLINICAL DATA:  Cough EXAM: PORTABLE CHEST 1 VIEW  COMPARISON:  May 10, 2019 FINDINGS: Lungs are clear. Heart size and pulmonary vascularity are normal. No adenopathy. There is aortic atherosclerosis. There is degenerative change in the shoulders. IMPRESSION: Lungs clear. Stable cardiac silhouette. Aortic Atherosclerosis (ICD10-I70.0). Electronically Signed   By: William  Woodruff III M.D.   On: 11/16/2019 15:51    Procedures Procedures (including critical care time)  Medications Ordered in ED Medications  lactated ringers bolus 1,000 mL (0 mLs Intravenous Stopped 11/16/19 2102)    ED Course  I have reviewed the triage vital signs and the nursing notes.  Pertinent labs & imaging results that were available during my care of the patient were reviewed by me and considered in my medical decision making (see chart for details).    MDM Rules/Calculators/A&P                      79  year old male with generalized weakness.  No acute neurologic symptoms.  Probably some dehydration.  AKI.  Nausea, one episode of vomiting and poor p.o. intake yesterday.  Improved today no further vomiting.  Anemia appears to be close to baseline.  He was given a  liter of IV fluids.  He states that he feels much better.  He was ambulated.  Patient and his wife report that he seems to be back to his baseline.  Advised to stay well-hydrated.  Return precautions were discussed.  Outpatient follow-up otherwise. Final Clinical Impression(s) / ED Diagnoses Final diagnoses:  Dehydration  AKI (acute kidney injury) [facility])    Rx / DC Orders ED Discharge Orders    None       Virgel Manifold, MD 11/17/19 [id number]

## 2019-11-20 ENCOUNTER — Ambulatory Visit: Payer: Medicare Other | Admitting: Internal Medicine

## 2019-11-20 LAB — URINE CULTURE: Culture: 80000 — AB

## 2019-11-21 ENCOUNTER — Telehealth: Payer: Self-pay | Admitting: *Deleted

## 2019-11-21 NOTE — Telephone Encounter (Signed)
Post ED Visit - Positive Culture Follow-up  Culture report reviewed by antimicrobial stewardship pharmacist: [place] Team []  Elenor Quinones, Pharm.D. []  Heide Guile, Pharm.D., BCPS AQ-ID []  Parks Neptune, Pharm.D., BCPS []  Alycia Rossetti, Pharm.D., BCPS []  [place], Pharm.D., BCPS, AAHIVP []  Legrand Como, Pharm.D., BCPS, AAHIVP []  Salome Arnt, PharmD, BCPS []  Johnnette Gourd, PharmD, BCPS []  Hughes Better, PharmD, BCPS []  Leeroy Cha, PharmD []  Laqueta Linden, PharmD, BCPS []  Albertina Parr, PharmD Nicoletta Dress, PharmD  [place] Team []  Leodis Sias, PharmD []  Lindell Spar, PharmD []  Royetta Asal, PharmD []  Graylin Shiver, Rph []  Rema Fendt) Glennon Mac, PharmD []  Arlyn Dunning, PharmD []  Netta Cedars, PharmD []  Dia Sitter, PharmD []  Leone Haven, PharmD []  Gretta Arab, PharmD []  Theodis Shove, PharmD []  Peggyann Juba, PharmD []  Reuel Boom, PharmD   Positive urine culture No urinary symptoms and no further patient follow-up is required at this time.  Harlon [facility] 11/21/2019, 12:09 PM

## 2019-11-21 NOTE — Progress Notes (Addendum)
Entered in error - please disregard

## 2019-11-25 ENCOUNTER — Telehealth: Payer: Self-pay | Admitting: Internal Medicine

## 2019-11-25 NOTE — Telephone Encounter (Signed)
Please advise 

## 2019-11-25 NOTE — Telephone Encounter (Signed)
Ok for [employer] 180 mg per day prn, and OTC Nasacort as directed as well

## 2019-11-25 NOTE — Telephone Encounter (Signed)
   Victor Davis from [facility] Calling to report clear runny nose and wheezing. Seeking advice for allergy medication

## 2019-11-26 NOTE — Telephone Encounter (Signed)
Notified [name] w/MD response.Marland KitchenJohny Davis

## 2019-11-27 ENCOUNTER — Ambulatory Visit: Payer: Medicare Other | Admitting: Internal Medicine

## 2019-12-02 ENCOUNTER — Other Ambulatory Visit: Payer: Self-pay | Admitting: Internal Medicine

## 2019-12-02 ENCOUNTER — Encounter: Payer: Medicare Other | Admitting: Thoracic Surgery (Cardiothoracic Vascular Surgery)

## 2019-12-02 NOTE — Telephone Encounter (Signed)
Please refill as per office routine med refill policy (all routine meds refilled for 3 mo or monthly per pt preference up to one year from last visit, then month to month grace period for 3 mo, then further med refills will have to be denied)  

## 2019-12-10 ENCOUNTER — Other Ambulatory Visit: Payer: Self-pay | Admitting: Internal Medicine

## 2019-12-11 ENCOUNTER — Other Ambulatory Visit: Payer: Self-pay | Admitting: Internal Medicine

## 2019-12-12 ENCOUNTER — Other Ambulatory Visit: Payer: Self-pay | Admitting: Internal Medicine

## 2019-12-12 NOTE — Telephone Encounter (Signed)
Please refill as per office routine med refill policy (all routine meds refilled for 3 mo or monthly per pt preference up to one year from last visit, then month to month grace period for 3 mo, then further med refills will have to be denied)  

## 2019-12-14 ENCOUNTER — Other Ambulatory Visit: Payer: Self-pay | Admitting: Internal Medicine

## 2019-12-14 NOTE — Telephone Encounter (Signed)
Please refill as per office routine med refill policy (all routine meds refilled for 3 mo or monthly per pt preference up to one year from last visit, then month to month grace period for 3 mo, then further med refills will have to be denied)  

## 2019-12-15 ENCOUNTER — Other Ambulatory Visit: Payer: Self-pay | Admitting: Internal Medicine

## 2019-12-18 ENCOUNTER — Other Ambulatory Visit: Payer: Self-pay | Admitting: Internal Medicine

## 2019-12-18 NOTE — Telephone Encounter (Signed)
Please refill as per office routine med refill policy (all routine meds refilled for 3 mo or monthly per pt preference up to one year from last visit, then month to month grace period for 3 mo, then further med refills will have to be denied)  

## 2020-01-11 ENCOUNTER — Other Ambulatory Visit: Payer: Self-pay | Admitting: Internal Medicine

## 2020-01-11 NOTE — Telephone Encounter (Signed)
Please refill as per office routine med refill policy (all routine meds refilled for 3 mo or monthly per pt preference up to one year from last visit, then month to month grace period for 3 mo, then further med refills will have to be denied)  

## 2020-02-06 ENCOUNTER — Ambulatory Visit: Payer: Medicare Other | Admitting: Podiatry

## 2020-02-13 ENCOUNTER — Other Ambulatory Visit: Payer: Self-pay | Admitting: Internal Medicine

## 2020-02-20 ENCOUNTER — Ambulatory Visit (HOSPITAL_COMMUNITY)
Admission: RE | Admit: 2020-02-20 | Payer: Medicare Other | Source: Ambulatory Visit | Attending: Cardiovascular Disease | Admitting: Cardiovascular Disease

## 2020-02-24 ENCOUNTER — Other Ambulatory Visit: Payer: Self-pay | Admitting: Internal Medicine

## 2020-02-24 NOTE — Telephone Encounter (Signed)
Please refill as per office routine med refill policy (all routine meds refilled for 3 mo or monthly per pt preference up to one year from last visit, then month to month grace period for 3 mo, then further med refills will have to be denied)  

## 2020-02-26 ENCOUNTER — Other Ambulatory Visit: Payer: Self-pay | Admitting: Internal Medicine

## 2020-03-18 ENCOUNTER — Telehealth: Payer: Self-pay

## 2020-03-18 NOTE — Telephone Encounter (Signed)
New message     1. Name of Medication: lovastatin (MEVACOR) 40 MG tablet  2. How are you currently taking this medication (dosage and times per day)? At bedtime   3. Are you having a reaction (difficulty breathing--STAT)? No   4. What is your medication issue? Can medication be discontinue to due two small meals medication is not hospice coverage

## 2020-03-18 NOTE — Telephone Encounter (Signed)
Sent to Dr. John. 

## 2020-03-19 NOTE — Telephone Encounter (Signed)
Not sure why this is requested since he has 1 yr refills from oct 2020

## 2020-03-20 ENCOUNTER — Other Ambulatory Visit: Payer: Self-pay | Admitting: Internal Medicine

## 2020-03-22 ENCOUNTER — Inpatient Hospital Stay (HOSPITAL_COMMUNITY)
Admission: EM | Admit: 2020-03-22 | Discharge: 2020-03-26 | DRG: 871 | Disposition: A | Discharge status: Hospice | Attending: Internal Medicine | Admitting: Internal Medicine

## 2020-03-22 ENCOUNTER — Emergency Department (HOSPITAL_COMMUNITY)

## 2020-03-22 DIAGNOSIS — Z888 Allergy status to other drugs, medicaments and biological substances status: Secondary | ICD-10-CM

## 2020-03-22 DIAGNOSIS — C7951 Secondary malignant neoplasm of bone: Secondary | ICD-10-CM | POA: Diagnosis present

## 2020-03-22 DIAGNOSIS — Z8546 Personal history of malignant neoplasm of prostate: Secondary | ICD-10-CM

## 2020-03-22 DIAGNOSIS — W1830XA Fall on same level, unspecified, initial encounter: Secondary | ICD-10-CM | POA: Diagnosis present

## 2020-03-22 DIAGNOSIS — Z8673 Personal history of transient ischemic attack (TIA), and cerebral infarction without residual deficits: Secondary | ICD-10-CM

## 2020-03-22 DIAGNOSIS — E785 Hyperlipidemia, unspecified: Secondary | ICD-10-CM | POA: Diagnosis not present

## 2020-03-22 DIAGNOSIS — N390 Urinary tract infection, site not specified: Secondary | ICD-10-CM | POA: Diagnosis not present

## 2020-03-22 DIAGNOSIS — D631 Anemia in chronic kidney disease: Secondary | ICD-10-CM | POA: Diagnosis present

## 2020-03-22 DIAGNOSIS — D696 Thrombocytopenia, unspecified: Secondary | ICD-10-CM | POA: Diagnosis not present

## 2020-03-22 DIAGNOSIS — E43 Unspecified severe protein-calorie malnutrition: Secondary | ICD-10-CM | POA: Diagnosis not present

## 2020-03-22 DIAGNOSIS — Z886 Allergy status to analgesic agent status: Secondary | ICD-10-CM

## 2020-03-22 DIAGNOSIS — Z515 Encounter for palliative care: Secondary | ICD-10-CM | POA: Diagnosis present

## 2020-03-22 DIAGNOSIS — Z66 Do not resuscitate: Secondary | ICD-10-CM | POA: Diagnosis not present

## 2020-03-22 DIAGNOSIS — G2581 Restless legs syndrome: Secondary | ICD-10-CM | POA: Diagnosis not present

## 2020-03-22 DIAGNOSIS — G893 Neoplasm related pain (acute) (chronic): Secondary | ICD-10-CM | POA: Diagnosis not present

## 2020-03-22 DIAGNOSIS — N189 Chronic kidney disease, unspecified: Secondary | ICD-10-CM

## 2020-03-22 DIAGNOSIS — N184 Chronic kidney disease, stage 4 (severe): Secondary | ICD-10-CM | POA: Diagnosis not present

## 2020-03-22 DIAGNOSIS — I10 Essential (primary) hypertension: Secondary | ICD-10-CM | POA: Diagnosis not present

## 2020-03-22 DIAGNOSIS — N179 Acute kidney failure, unspecified: Secondary | ICD-10-CM | POA: Diagnosis not present

## 2020-03-22 DIAGNOSIS — Z902 Acquired absence of lung [part of]: Secondary | ICD-10-CM

## 2020-03-22 DIAGNOSIS — E860 Dehydration: Secondary | ICD-10-CM | POA: Diagnosis present

## 2020-03-22 DIAGNOSIS — I129 Hypertensive chronic kidney disease with stage 1 through stage 4 chronic kidney disease, or unspecified chronic kidney disease: Secondary | ICD-10-CM | POA: Diagnosis present

## 2020-03-22 DIAGNOSIS — C61 Malignant neoplasm of prostate: Secondary | ICD-10-CM | POA: Diagnosis not present

## 2020-03-22 DIAGNOSIS — Z79899 Other long term (current) drug therapy: Secondary | ICD-10-CM

## 2020-03-22 DIAGNOSIS — R6889 Other general symptoms and signs: Secondary | ICD-10-CM | POA: Diagnosis not present

## 2020-03-22 DIAGNOSIS — Z87891 Personal history of nicotine dependence: Secondary | ICD-10-CM

## 2020-03-22 DIAGNOSIS — R404 Transient alteration of awareness: Secondary | ICD-10-CM | POA: Diagnosis not present

## 2020-03-22 DIAGNOSIS — I739 Peripheral vascular disease, unspecified: Secondary | ICD-10-CM | POA: Diagnosis not present

## 2020-03-22 DIAGNOSIS — E872 Acidosis: Secondary | ICD-10-CM | POA: Diagnosis present

## 2020-03-22 DIAGNOSIS — G9341 Metabolic encephalopathy: Secondary | ICD-10-CM | POA: Diagnosis present

## 2020-03-22 DIAGNOSIS — Z9079 Acquired absence of other genital organ(s): Secondary | ICD-10-CM

## 2020-03-22 DIAGNOSIS — E870 Hyperosmolality and hypernatremia: Secondary | ICD-10-CM | POA: Diagnosis present

## 2020-03-22 DIAGNOSIS — R531 Weakness: Secondary | ICD-10-CM | POA: Diagnosis not present

## 2020-03-22 DIAGNOSIS — R0902 Hypoxemia: Secondary | ICD-10-CM | POA: Diagnosis not present

## 2020-03-22 DIAGNOSIS — E875 Hyperkalemia: Secondary | ICD-10-CM | POA: Diagnosis present

## 2020-03-22 DIAGNOSIS — J9811 Atelectasis: Secondary | ICD-10-CM | POA: Diagnosis present

## 2020-03-22 DIAGNOSIS — Z8249 Family history of ischemic heart disease and other diseases of the circulatory system: Secondary | ICD-10-CM

## 2020-03-22 DIAGNOSIS — Z6820 Body mass index (BMI) 20.0-20.9, adult: Secondary | ICD-10-CM

## 2020-03-22 DIAGNOSIS — Z20822 Contact with and (suspected) exposure to covid-19: Secondary | ICD-10-CM | POA: Diagnosis present

## 2020-03-22 DIAGNOSIS — F0390 Unspecified dementia without behavioral disturbance: Secondary | ICD-10-CM | POA: Diagnosis present

## 2020-03-22 DIAGNOSIS — F329 Major depressive disorder, single episode, unspecified: Secondary | ICD-10-CM | POA: Diagnosis present

## 2020-03-22 DIAGNOSIS — F0150 Vascular dementia without behavioral disturbance: Secondary | ICD-10-CM | POA: Diagnosis not present

## 2020-03-22 DIAGNOSIS — G4700 Insomnia, unspecified: Secondary | ICD-10-CM | POA: Diagnosis not present

## 2020-03-22 DIAGNOSIS — Z743 Need for continuous supervision: Secondary | ICD-10-CM | POA: Diagnosis not present

## 2020-03-22 DIAGNOSIS — A419 Sepsis, unspecified organism: Principal | ICD-10-CM | POA: Diagnosis present

## 2020-03-22 DIAGNOSIS — K219 Gastro-esophageal reflux disease without esophagitis: Secondary | ICD-10-CM | POA: Diagnosis not present

## 2020-03-22 DIAGNOSIS — E878 Other disorders of electrolyte and fluid balance, not elsewhere classified: Secondary | ICD-10-CM | POA: Diagnosis present

## 2020-03-22 DIAGNOSIS — R64 Cachexia: Secondary | ICD-10-CM | POA: Diagnosis present

## 2020-03-22 DIAGNOSIS — I517 Cardiomegaly: Secondary | ICD-10-CM | POA: Diagnosis not present

## 2020-03-22 DIAGNOSIS — C3491 Malignant neoplasm of unspecified part of right bronchus or lung: Secondary | ICD-10-CM | POA: Diagnosis present

## 2020-03-22 DIAGNOSIS — Z801 Family history of malignant neoplasm of trachea, bronchus and lung: Secondary | ICD-10-CM

## 2020-03-22 DIAGNOSIS — I2510 Atherosclerotic heart disease of native coronary artery without angina pectoris: Secondary | ICD-10-CM | POA: Diagnosis present

## 2020-03-22 DIAGNOSIS — F32A Depression, unspecified: Secondary | ICD-10-CM | POA: Diagnosis present

## 2020-03-22 DIAGNOSIS — H547 Unspecified visual loss: Secondary | ICD-10-CM | POA: Diagnosis present

## 2020-03-22 DIAGNOSIS — Z96643 Presence of artificial hip joint, bilateral: Secondary | ICD-10-CM | POA: Diagnosis present

## 2020-03-22 DIAGNOSIS — R079 Chest pain, unspecified: Secondary | ICD-10-CM

## 2020-03-22 DIAGNOSIS — J309 Allergic rhinitis, unspecified: Secondary | ICD-10-CM | POA: Diagnosis present

## 2020-03-22 DIAGNOSIS — D509 Iron deficiency anemia, unspecified: Secondary | ICD-10-CM | POA: Diagnosis present

## 2020-03-22 DIAGNOSIS — Z955 Presence of coronary angioplasty implant and graft: Secondary | ICD-10-CM

## 2020-03-22 DIAGNOSIS — Z7952 Long term (current) use of systemic steroids: Secondary | ICD-10-CM

## 2020-03-22 LAB — CBC WITH DIFFERENTIAL/PLATELET
Abs Immature Granulocytes: 0.08 10*3/uL — ABNORMAL HIGH (ref 0.00–0.07)
Basophils Absolute: 0 10*3/uL (ref 0.0–0.1)
Basophils Relative: 0 %
Eosinophils Absolute: 0 10*3/uL (ref 0.0–0.5)
Eosinophils Relative: 0 %
HCT: 23.4 % — ABNORMAL LOW (ref 39.0–52.0)
Hemoglobin: 7.1 g/dL — ABNORMAL LOW (ref 13.0–17.0)
Immature Granulocytes: 1 %
Lymphocytes Relative: 5 %
Lymphs Abs: 0.5 10*3/uL — ABNORMAL LOW (ref 0.7–4.0)
MCH: 28.6 pg (ref 26.0–34.0)
MCHC: 30.3 g/dL (ref 30.0–36.0)
MCV: 94.4 fL (ref 80.0–100.0)
Monocytes Absolute: 0.8 10*3/uL (ref 0.1–1.0)
Monocytes Relative: 7 %
Neutro Abs: 10.2 10*3/uL — ABNORMAL HIGH (ref 1.7–7.7)
Neutrophils Relative %: 87 %
Platelets: 120 10*3/uL — ABNORMAL LOW (ref 150–400)
RBC: 2.48 MIL/uL — ABNORMAL LOW (ref 4.22–5.81)
RDW: 13.3 % (ref 11.5–15.5)
WBC: 11.7 10*3/uL — ABNORMAL HIGH (ref 4.0–10.5)
nRBC: 0 % (ref 0.0–0.2)

## 2020-03-22 LAB — SARS CORONAVIRUS 2 BY RT PCR (HOSPITAL ORDER, PERFORMED IN ~~LOC~~ HOSPITAL LAB): SARS Coronavirus 2: NEGATIVE

## 2020-03-22 LAB — URINALYSIS, ROUTINE W REFLEX MICROSCOPIC
Bilirubin Urine: NEGATIVE
Glucose, UA: NEGATIVE mg/dL
Ketones, ur: NEGATIVE mg/dL
Nitrite: NEGATIVE
Protein, ur: 100 mg/dL — AB
Specific Gravity, Urine: 1.01 (ref 1.005–1.030)
WBC, UA: >50 WBC/hpf — ABNORMAL HIGH (ref 0–5)
pH: 7 (ref 5.0–8.0)

## 2020-03-22 LAB — CBC
HCT: 24 % — ABNORMAL LOW (ref 39.0–52.0)
Hemoglobin: 7.2 g/dL — ABNORMAL LOW (ref 13.0–17.0)
MCH: 29.1 pg (ref 26.0–34.0)
MCHC: 30 g/dL (ref 30.0–36.0)
MCV: 97.2 fL (ref 80.0–100.0)
Platelets: 126 10*3/uL — ABNORMAL LOW (ref 150–400)
RBC: 2.47 MIL/uL — ABNORMAL LOW (ref 4.22–5.81)
RDW: 13.5 % (ref 11.5–15.5)
WBC: 13.1 10*3/uL — ABNORMAL HIGH (ref 4.0–10.5)
nRBC: 0 % (ref 0.0–0.2)

## 2020-03-22 LAB — BASIC METABOLIC PANEL
Anion gap: 16 — ABNORMAL HIGH (ref 5–15)
BUN: 109 mg/dL — ABNORMAL HIGH (ref 8–23)
CO2: 18 mmol/L — ABNORMAL LOW (ref 22–32)
Calcium: 9.4 mg/dL (ref 8.9–10.3)
Chloride: 106 mmol/L (ref 98–111)
Creatinine, Ser: 7.63 mg/dL — ABNORMAL HIGH (ref 0.61–1.24)
GFR calc Af Amer: 7 mL/min — ABNORMAL LOW (ref >60)
GFR calc non Af Amer: 6 mL/min — ABNORMAL LOW (ref >60)
Glucose, Bld: 103 mg/dL — ABNORMAL HIGH (ref 70–99)
Potassium: 5.1 mmol/L (ref 3.5–5.1)
Sodium: 140 mmol/L (ref 135–145)

## 2020-03-22 LAB — CREATININE, SERUM
Creatinine, Ser: 7.19 mg/dL — ABNORMAL HIGH (ref 0.61–1.24)
GFR calc Af Amer: 8 mL/min — ABNORMAL LOW (ref >60)
GFR calc non Af Amer: 7 mL/min — ABNORMAL LOW (ref >60)

## 2020-03-22 LAB — PHOSPHORUS: Phosphorus: 5.4 mg/dL — ABNORMAL HIGH (ref 2.5–4.6)

## 2020-03-22 LAB — FOLATE: Folate: 16.9 ng/mL (ref >5.9)

## 2020-03-22 LAB — VITAMIN B12: Vitamin B-12: 794 pg/mL (ref 180–914)

## 2020-03-22 LAB — AMMONIA: Ammonia: 16 umol/L (ref 9–35)

## 2020-03-22 LAB — LACTIC ACID, PLASMA
Lactic Acid, Venous: 0.7 mmol/L (ref 0.5–1.9)
Lactic Acid, Venous: 0.6 mmol/L (ref 0.5–1.9)

## 2020-03-22 LAB — MAGNESIUM: Magnesium: 2 mg/dL (ref 1.7–2.4)

## 2020-03-22 MED ORDER — ACETAMINOPHEN 325 MG PO TABS
650.0000 mg | ORAL_TABLET | Freq: Four times a day (QID) | ORAL | Status: DC | PRN
  Filled 2020-03-22: qty 2

## 2020-03-22 MED ORDER — SODIUM CHLORIDE 0.9 % IV BOLUS
1000.0000 mL | Freq: Once | INTRAVENOUS | Status: AC
  Administered 2020-03-22: 1000 mL via INTRAVENOUS

## 2020-03-22 MED ORDER — HEPARIN SODIUM (PORCINE) 5000 UNIT/ML IJ SOLN
5000.0000 [IU] | Freq: Three times a day (TID) | INTRAMUSCULAR | Status: DC
  Administered 2020-03-22 – 2020-03-25 (×8): 5000 [IU] via SUBCUTANEOUS
  Filled 2020-03-22 (×8): qty 1

## 2020-03-22 MED ORDER — HYDRALAZINE HCL 20 MG/ML IJ SOLN
10.0000 mg | Freq: Four times a day (QID) | INTRAMUSCULAR | Status: DC | PRN
  Administered 2020-03-23 (×2): 10 mg via INTRAVENOUS
  Filled 2020-03-22 (×2): qty 1

## 2020-03-22 MED ORDER — SODIUM CHLORIDE 0.9 % IV SOLN: INTRAVENOUS | Status: DC

## 2020-03-22 MED ORDER — SODIUM CHLORIDE 0.9 % IV SOLN
1.0000 g | Freq: Once | INTRAVENOUS | Status: AC
  Administered 2020-03-22: 1 g via INTRAVENOUS
  Filled 2020-03-22: qty 10

## 2020-03-22 MED ORDER — SODIUM CHLORIDE 0.9 % IV SOLN
1.0000 g | INTRAVENOUS | Status: DC
  Filled 2020-03-22: qty 10

## 2020-03-22 MED ORDER — ONDANSETRON HCL 4 MG PO TABS: 4.0000 mg | ORAL_TABLET | Freq: Four times a day (QID) | ORAL | Status: DC | PRN

## 2020-03-22 MED ORDER — ONDANSETRON HCL 4 MG/2ML IJ SOLN: 4.0000 mg | Freq: Four times a day (QID) | INTRAMUSCULAR | Status: DC | PRN

## 2020-03-22 MED ORDER — ACETAMINOPHEN 650 MG RE SUPP
650.0000 mg | Freq: Four times a day (QID) | RECTAL | Status: DC | PRN
  Administered 2020-03-23: 650 mg via RECTAL
  Filled 2020-03-22: qty 1

## 2020-03-22 NOTE — ED Provider Notes (Signed)
[facility] EMERGENCY DEPARTMENT Provider Note   CSN: [id number] Arrival date & time: 03/22/20  1339     History No chief complaint on file.   Victor Davis is a 79 y.o. male.  Patient is an 79 year old male with past medical history of prostate cancer, chronic renal insufficiency, anemia, coronary artery disease with stent, dementia.  Patient is being cared for by hospice of [facility].  Wife describes worsening mental status since yesterday.  He states that he has not had much to eat or drink in the past 2 days.  Yesterday evening he became less responsive, then this worsened this morning.  The wife called 911 as well as the hospice care coordinator.  The hospice nurse and EMS arrived on scene at nearly the same time.  The wife is requesting him be seen in the ER.  She is concerned about the possibility of dehydration as he has behaved this way while dehydrated in the past.  Patient has no additional history secondary to dementia, altered mental status, and acuity of condition.  The history is provided by the patient.       Past Medical History:  Diagnosis Date  . Anemia, unspecified   . Cancer ([place])    lung and prostate cancer per wife  . Carotid artery disease ([place])   . CKD (chronic kidney disease), stage IV ([place])   . Coronary artery disease    a. DES to LAD and Cx 02/2012.  [name] Dementia ([place])   . Dysuria 10/10/2010  . Emphysema 04/01/2012   By CT chest, august 2013  . ERECTILE DYSFUNCTION 04/05/2007  . GERD (gastroesophageal reflux disease)   . GI bleeding   . HYPERLIPIDEMIA 07/30/2008  . HYPERSOMNIA 10/02/2007  . Impaired glucose tolerance 02/09/2011  . INSOMNIA-SLEEP DISORDER-UNSPEC 10/22/2009  . LOW BACK PAIN 04/05/2007  . OSTEOARTHRITIS 04/05/2007  . PEPTIC ULCER DISEASE 04/05/2007  . PERIPHERAL EDEMA 10/22/2009  . PROSTATE CANCER, HX OF 04/05/2007  . RESTLESS LEG SYNDROME 05/15/2007  . RHINITIS, ALLERGIC NOS 05/15/2007  . Sinus bradycardia 10/24/2010   a.  prior h/o HR 40s, clonidine stopped 05/2017 due to this.  . Stroke (cerebrum) ([place])   . Unspecified visual loss 07/15/2008  . UTI 10/24/2010    Patient Active Problem List   Diagnosis Date Noted  . Gross hematuria 05/31/2019  . Acute blood loss anemia 05/31/2019  . Failure to thrive in adult 05/10/2019  . Allergic conjunctivitis 02/24/2019  . Gait disorder 12/24/2018  . Weight loss 12/24/2018  . Thrombocytopenia ([place]) 05/28/2018  . Arm bruise 04/01/2018  . Carotid stenosis 01/28/2018  . Symptomatic anemia 11/26/2017  . Depression 06/20/2017  . Bradycardia/Falls 06/08/2017  . Head injury without skull fracture/S/p Fall with Lt Frontal Laceration 06/08/2017  . Sinus bradycardia 06/08/2017  . B12 deficiency 04/11/2017  . CKD (chronic kidney disease), stage III ([place]) 02/04/2016  . Snoring 01/21/2016  . Dementia ([place]) 10/08/2015  . Multiple bruises 10/08/2015  . Non-small cell carcinoma of right lung, stage 1 ([place]) 04/29/2015  . Lung nodule 03/22/2015  . Cholecystostomy drain infection ([place]) 01/25/2015  . Abdominal pain 01/18/2015  . Cholecystitis 01/18/2015  . Acute on chronic kidney failure ([place]) 01/18/2015  . Right upper quadrant pain   . Acute cholecystitis   . Renal cyst 12/31/2012  . Hearing loss 07/05/2012  . Lesion of nose 07/05/2012  . Anemia, unspecified 04/02/2012  . Pulmonary emphysema ([place]) 04/01/2012  . Hypertension 03/09/2012  . Acute lower UTI 03/09/2012  . Pulmonary  nodule, right 02/08/2012  . Mitral regurgitation 02/06/2012  . CAD (coronary artery disease) 01/04/2012  . Unspecified disorder of liver 12/18/2011  . Hyperthyroidism 12/18/2011  . Nonspecific abnormal findings on radiological and examination of lung field 12/18/2011  . Impaired glucose tolerance 02/09/2011  . Encounter for well adult exam with abnormal findings 02/09/2011  . INSOMNIA-SLEEP DISORDER-UNSPEC 10/22/2009  . PERIPHERAL EDEMA 10/22/2009  . Hyperlipidemia 07/30/2008  . Peripheral  vascular disease ([place]) 07/30/2008  . 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 Unspecified visual loss 07/15/2008  . SHOULDER PAIN, LEFT 01/22/2008  . Hypersomnia 10/02/2007  . RESTLESS LEG SYNDROME 05/15/2007  . RHINITIS, ALLERGIC NOS 05/15/2007  . ERECTILE DYSFUNCTION 04/05/2007  . PEPTIC ULCER DISEASE 04/05/2007  . Osteoarthritis 04/05/2007  . LOW BACK PAIN 04/05/2007  . PROSTATE CANCER, HX OF 04/05/2007    Past Surgical History:  Procedure Laterality Date  . CARDIAC CATHETERIZATION    . COLONOSCOPY N/A 11/28/2017   Procedure: COLONOSCOPY;  Surgeon: Yetta Flock, MD;  Location: [facility] ENDOSCOPY;  Service: Gastroenterology;  Laterality: N/A;  . ESOPHAGOGASTRODUODENOSCOPY N/A 11/28/2017   Procedure: ESOPHAGOGASTRODUODENOSCOPY (EGD);  Surgeon: Yetta Flock, MD;  Location: [facility];  Service: Gastroenterology;  Laterality: N/A;  . EYE SURGERY     right  . Inguinal herniorrhapy left  2002   x 2  . Left hip replacement    . LYMPH NODE DISSECTION Right 03/22/2015   Procedure: LYMPH NODE DISSECTION;  Surgeon: Melrose Nakayama, MD;  Location: [facility];  Service: Thoracic;  Laterality: Right;  . PERCUTANEOUS CORONARY STENT INTERVENTION (PCI-S) N/A 03/08/2012   Procedure: PERCUTANEOUS CORONARY STENT INTERVENTION (PCI-S);  Surgeon: Burnell Blanks, MD;  Location: [facility] CATH LAB;  Service: Cardiovascular;  Laterality: N/A;  . PROSTATECTOMY  2002   radical  . Right hip replacement  09/22/09  . ROTATOR CUFF REPAIR     left  . s/p ventral surgery  2009  . SEGMENTECOMY Right 03/22/2015   Procedure: RIGHT LOWER LOBE SUPERIOR SEGMENTECTOMY;  Surgeon: Melrose Nakayama, MD;  Location: [facility];  Service: Thoracic;  Laterality: Right;  [name] VIDEO ASSISTED THORACOSCOPY Right 03/22/2015   Procedure: RIGHT VIDEO ASSISTED THORACOSCOPY;  Surgeon: Melrose Nakayama, MD;  Location: [facility];  Service: Thoracic;  Laterality: Right;       Family History  Problem Relation Age of Onset  . Heart  attack Father 7       Smoker  . Heart attack Brother 71  . Lung cancer Sister   . Colon cancer Neg Hx     Social History   Tobacco Use  . Smoking status: Former Smoker    Packs/day: 1.00    Years: 15.00    Pack years: 15.00    Types: Cigarettes    Quit date: 02/05/1977    Years since quitting: 43.1  . Smokeless tobacco: Never Used  Vaping Use  . Vaping Use: Never used  Substance Use Topics  . Alcohol use: No    Alcohol/week: 1.0 standard drink    Types: 1 Standard drinks or equivalent per week    Comment: RARE  . Drug use: No    Home Medications Prior to Admission medications   Medication Sig Start Date End Date Taking? Authorizing Provider  abiraterone acetate (ZYTIGA) 250 MG tablet Take 1,000 mg by mouth at bedtime. 05/12/19   [provider]  amLODipine (NORVASC) 10 MG tablet TAKE 1 TABLET (10 MG TOTAL) BY MOUTH DAILY. ANNUAL APPT DUE IN MAY MUST SEE PROVIDER FOR FUTURE  REFILLS 01/13/20   Biagio Borg, MD  amLODipine (NORVASC) 5 MG tablet Take 1 tablet (5 mg total) by mouth daily. 02/24/19 02/24/20  Biagio Borg, MD  Ascorbic Acid (VITAMIN C) 500 MG CAPS Take 500 mg by mouth daily.     [provider]  azelastine (OPTIVAR) 0.05 % ophthalmic solution Place 1 drop into both eyes 2 (two) times daily. 02/24/19   Biagio Borg, MD  citalopram (CELEXA) 20 MG tablet TAKE 1 TABLET BY MOUTH EVERY DAY 06/24/19   Biagio Borg, MD  donepezil (ARICEPT) 5 MG tablet TAKE 1 TABLET BY MOUTH AT BEDTIME. ANNUAL APPT DUE IN MAY MUST SEE PROVIDER FOR FUTURE REFILLS 03/22/20   Biagio Borg, MD  ferrous sulfate 325 (65 FE) MG tablet Take 325 mg by mouth daily.  04/04/18   [provider]  gabapentin (NEURONTIN) 300 MG capsule TAKE 1 CAPSULE BY MOUTH TWICE A DAY 06/30/19   Biagio Borg, MD  hydrALAZINE (APRESOLINE) 25 MG tablet TAKE 1 TABLET (25 MG TOTAL) BY MOUTH EVERY 8 (EIGHT) HOURS. 12/12/19   Biagio Borg, MD  isosorbide mononitrate (IMDUR) 30 MG 24 hr tablet Take 1  tablet (30 mg total) by mouth daily. Annual appt is due must see provider for future refills 12/18/19   Biagio Borg, MD  lovastatin (MEVACOR) 40 MG tablet Take 1 tablet (40 mg total) by mouth at bedtime. 05/29/19   Biagio Borg, MD  meclizine (ANTIVERT) 25 MG tablet Take 0.5-1 tablets (12.5-25 mg total) by mouth as needed for dizziness. TAKE 1/2 - 1 TABLET BY MOUTH THREE TIMES A DAY AS NEEDED FOR DIZINESS 02/24/19   Biagio Borg, MD  nitroGLYCERIN (NITROSTAT) 0.4 MG SL tablet Place 1 tablet (0.4 mg total) under the tongue every 5 (five) minutes as needed for chest pain. 07/29/18   Burnell Blanks, MD  pantoprazole (PROTONIX) 40 MG tablet TAKE 1 TABLET BY MOUTH EVERY DAY 12/15/19   Biagio Borg, MD  pramipexole (MIRAPEX) 0.5 MG tablet TAKE 1 TABLET BY MOUTH 3 TIMES A DAY *ANNUAL APPT DUE IN MAY** 12/15/19   Biagio Borg, MD  predniSONE (DELTASONE) 5 MG tablet Take 5 mg by mouth at bedtime.     [provider]  Thiamine HCl (VITAMIN B1) 50 MG TABS TAKE 1 TABLET BY MOUTH DAILY. ANNUAL APPT DUE IN MAY MUST SEE PROVIDER FOR FUTURE REFILLS 02/26/20   Biagio Borg, MD  vitamin B-12 (CYANOCOBALAMIN) 1000 MCG tablet Take 1 tablet (1,000 mcg total) by mouth daily. 05/29/19   Biagio Borg, MD  Vitamin D, Cholecalciferol, 50 MCG (2000 UT) CAPS Take 50 mcg by mouth daily.    [provider]    Allergies    Amitriptyline hcl, Diphenhydramine hcl, Simvastatin, Clonidine derivatives, and Tylenol [acetaminophen]  Review of Systems   Review of Systems  All other systems reviewed and are negative.   Physical Exam Updated Vital Signs BP (!) 157/91 (BP Location: Right Arm)   Pulse 81   Temp 99.6 F (37.6 C) (Axillary)   Resp (!) 22   SpO2 100%   Physical Exam Vitals and nursing note reviewed.  Constitutional:      General: He is not in acute distress.    Appearance: He is well-developed. He is not diaphoretic.     Comments: Patient is somnolent.  He will attempt to speak but  with garbled words when stimulated with loud voice.  He does move all extremities.  Remainder of neuro exam is difficult secondary to overall clinical condition.  HENT:     Head: Normocephalic and atraumatic.  Cardiovascular:     Rate and Rhythm: Normal rate and regular rhythm.     Heart sounds: No murmur heard.  No friction rub.  Pulmonary:     Effort: Pulmonary effort is normal. No respiratory distress.     Breath sounds: Normal breath sounds. No wheezing or rales.  Abdominal:     General: Bowel sounds are normal. There is no distension.     Palpations: Abdomen is soft.     Tenderness: There is no abdominal tenderness.  Musculoskeletal:        General: Normal range of motion.     Cervical back: Normal range of motion and neck supple.  Skin:    General: Skin is warm and dry.  Neurological:     Coordination: Coordination normal.     Comments: Patient is somnolent.  He will attempt to speak but with garbled words when stimulated with loud voice.  He does move all extremities.  Remainder of neuro exam is difficult secondary to overall clinical condition.      ED Results / Procedures / Treatments   Labs (all labs ordered are listed, but only abnormal results are displayed) Labs Reviewed  CULTURE, BLOOD (ROUTINE X 2)  CULTURE, BLOOD (ROUTINE X 2)  BASIC METABOLIC PANEL  CBC WITH DIFFERENTIAL/PLATELET  URINALYSIS, ROUTINE W REFLEX MICROSCOPIC  LACTIC ACID, PLASMA  LACTIC ACID, PLASMA    EKG None  Radiology No results found.  Procedures Procedures (including critical care time)  Medications Ordered in ED Medications  sodium chloride 0.9 % bolus 1,000 mL (has no administration in time range)    ED Course  I have reviewed the triage vital signs and the nursing notes.  Pertinent labs & imaging results that were available during my care of the patient were reviewed by me and considered in my medical decision making (see chart for details).    MDM  Rules/Calculators/A&P  Patient is a an 79 year old male with history of prostate cancer brought for evaluation of altered mental status.  According to the wife, he has been less responsive for the past 2 days.  He is also not had much food or liquid intake.  Patient adds little additional history secondary to his mental status and overall medical condition.  Patient's vital signs are stable.  He is borderline febrile with temperature of 99.6.  Laboratory studies have been obtained and are pending.  IV fluids initiated.  Care will be signed out to Dr. Ralene Bathe at shift change.  She will obtain the results of the laboratory studies and determine the final disposition.  Final Clinical Impression(s) / ED Diagnoses Final diagnoses:  None    Rx / DC Orders ED Discharge Orders    None       Veryl Speak, MD 03/23/20 [phone]

## 2020-03-22 NOTE — ED Triage Notes (Signed)
Pt presents from home for decreased mental status (conversation at baseline, incoherent today), with hospice for prostate CA. Wife called EMS, hospice nurse arrived at same time as ems, reported to EMS that she would speak to [facility] liaison to arrange treatment at [facility].

## 2020-03-22 NOTE — Progress Notes (Addendum)
**  Addendum**    **Received request from [place] for family interest in [facility]. Unfortunately [employer] is unable to offer a room today. Hospital Liaison will follow up tomorrow or sooner if a room becomes available.   Please do not hesitate to call with questions.  **   Manufacturing engineer [facility]) [place]        This patient is enrolled as a hospice patient with [facility].  The hospital liaisons will continue to follow throughout this stay.   Per report from homecare RN case manager pt has been declining recently, with recent falls and decreased LOC. Per CM pt was difficult to rouse this am for spouse who verbalized desire to treat.  Per CM pt is DNR.    If you have questions or need assistance, please call [phone] or contact the hospital Liaison listed on AMION.        Domenic Moras, BSN, RN [facility] Liaison   [phone] (24h on call)

## 2020-03-22 NOTE — H&P (Addendum)
History and Physical    Victor Davis DUK:[id number] DOB: [birth year] DOA: 03/22/2020  PCP: Biagio Borg, MD  Patient coming from: Home  I have personally briefly reviewed patient's old medical records in [place]  Chief Complaint: Generalized weakness, AMA  HPI: Victor Davis is a 79 y.o. male with medical history significant of metastatic prostate cancer, anemia of chronic disease, chronic thrombocytopenia, CKD stage IIIb, hypertension, hyperlipidemia, coronary artery disease status post stents to LAD,Lcx in 02/2012, carotid artery stenosis, CVA, dementia brought by EMS to the emergency department with generalized weakness and confusion.  Patient is currently on home hospice.  Wife reports that patient has worsening mental status since yesterday, has been lethargic, decreased appetite, has not been eating or drinking since past 2 days, unable to walk due to generalized weakness, yesterday evening he became less responsive, difficult to arouse and his symptoms worsened this morning therefore she called 911 and brought patient to the emergency department for further evaluation and management for possible IV fluids and IV antibiotics but does not want any heroic measures.  ED physician discussed with hospice coordinator-[facility] with no beds available at this time.  No history of head trauma, seizures, loss of consciousness, fever, chills, cough, congestion, nausea, vomiting, diarrhea.  ED Course: Upon arrival to ED: Patient tachypneic in 20s, blood pressure 157/91, afebrile with leukocytosis of 11.7, CBC shows anemia of chronic disease, platelet: 20, CMP shows worsening kidney function, potassium: WNL, anion gap: 16.  Lactic acid: WNL.  Chest x-ray shows cardiomegaly, bibasilar opacity likely atelectasis.  Blood culture, UA, COVID-19 pending.  Patient received IV fluid bolus and Rocephin in the ED.  [facility] hospitalist consulted for admission.   Review of Systems: As per HPI otherwise  negative.    Past Medical History:  Diagnosis Date  . Anemia, unspecified   . Cancer ([place])    lung and prostate cancer per wife  . Carotid artery disease ([place])   . CKD (chronic kidney disease), stage IV ([place])   . Coronary artery disease    a. DES to LAD and Cx 02/2012.  [name] Dementia ([place])   . Dysuria 10/10/2010  . Emphysema 04/01/2012   By CT chest, august 2013  . ERECTILE DYSFUNCTION 04/05/2007  . GERD (gastroesophageal reflux disease)   . GI bleeding   . HYPERLIPIDEMIA 07/30/2008  . HYPERSOMNIA 10/02/2007  . Impaired glucose tolerance 02/09/2011  . INSOMNIA-SLEEP DISORDER-UNSPEC 10/22/2009  . LOW BACK PAIN 04/05/2007  . OSTEOARTHRITIS 04/05/2007  . PEPTIC ULCER DISEASE 04/05/2007  . PERIPHERAL EDEMA 10/22/2009  . PROSTATE CANCER, HX OF 04/05/2007  . RESTLESS LEG SYNDROME 05/15/2007  . RHINITIS, ALLERGIC NOS 05/15/2007  . Sinus bradycardia 10/24/2010   a. prior h/o HR 40s, clonidine stopped 05/2017 due to this.  . Stroke (cerebrum) ([place])   . Unspecified visual loss 07/15/2008  . UTI 10/24/2010    Past Surgical History:  Procedure Laterality Date  . CARDIAC CATHETERIZATION    . COLONOSCOPY N/A 11/28/2017   Procedure: COLONOSCOPY;  Surgeon: Yetta Flock, MD;  Location: [facility];  Service: Gastroenterology;  Laterality: N/A;  . ESOPHAGOGASTRODUODENOSCOPY N/A 11/28/2017   Procedure: ESOPHAGOGASTRODUODENOSCOPY (EGD);  Surgeon: Yetta Flock, MD;  Location: [facility];  Service: Gastroenterology;  Laterality: N/A;  . EYE SURGERY     right  . Inguinal herniorrhapy left  2002   x 2  . Left hip replacement    . LYMPH NODE DISSECTION Right 03/22/2015   Procedure: LYMPH NODE DISSECTION;  Surgeon:  Melrose Nakayama, MD;  Location: [facility];  Service: Thoracic;  Laterality: Right;  . PERCUTANEOUS CORONARY STENT INTERVENTION (PCI-S) N/A 03/08/2012   Procedure: PERCUTANEOUS CORONARY STENT INTERVENTION (PCI-S);  Surgeon: Burnell Blanks, MD;  Location: [facility] LAB;  Service:  Cardiovascular;  Laterality: N/A;  . PROSTATECTOMY  2002   radical  . Right hip replacement  09/22/09  . ROTATOR CUFF REPAIR     left  . s/p ventral surgery  2009  . SEGMENTECOMY Right 03/22/2015   Procedure: RIGHT LOWER LOBE SUPERIOR SEGMENTECTOMY;  Surgeon: Melrose Nakayama, MD;  Location: [facility];  Service: Thoracic;  Laterality: Right;  [name] VIDEO ASSISTED THORACOSCOPY Right 03/22/2015   Procedure: RIGHT VIDEO ASSISTED THORACOSCOPY;  Surgeon: Melrose Nakayama, MD;  Location: [facility];  Service: Thoracic;  Laterality: Right;     reports that he quit smoking about 43 years ago. His smoking use included cigarettes. He has a 15.00 pack-year smoking history. He has never used smokeless tobacco. He reports that he does not drink alcohol and does not use drugs.  Allergies  Allergen Reactions  . Amitriptyline Hcl Other (See Comments)    REACTION: confusion  . Diphenhydramine Hcl Other (See Comments)    Keeps him awake  . Simvastatin Other (See Comments)    REACTION: leg pain  . Clonidine Derivatives     Dizziness, falls, bradycardia  . Tylenol [Acetaminophen] Other (See Comments)    Dr advised pt not to take due to finding a spot on pt's kidney    Family History  Problem Relation Age of Onset  . Heart attack Father 38       Smoker  . Heart attack Brother 77  . Lung cancer Sister   . Colon cancer Neg Hx     Prior to Admission medications   Medication Sig Start Date End Date Taking? Authorizing Provider  abiraterone acetate (ZYTIGA) 250 MG tablet Take 1,000 mg by mouth at bedtime. 05/12/19   [provider]  amLODipine (NORVASC) 10 MG tablet TAKE 1 TABLET (10 MG TOTAL) BY MOUTH DAILY. ANNUAL APPT DUE IN MAY MUST SEE PROVIDER FOR FUTURE REFILLS 01/13/20   Biagio Borg, MD  amLODipine (NORVASC) 5 MG tablet Take 1 tablet (5 mg total) by mouth daily. 02/24/19 02/24/20  Biagio Borg, MD  Ascorbic Acid (VITAMIN C) 500 MG CAPS Take 500 mg by mouth daily.     [provider]   azelastine (OPTIVAR) 0.05 % ophthalmic solution Place 1 drop into both eyes 2 (two) times daily. 02/24/19   Biagio Borg, MD  citalopram (CELEXA) 20 MG tablet TAKE 1 TABLET BY MOUTH EVERY DAY 06/24/19   Biagio Borg, MD  donepezil (ARICEPT) 5 MG tablet TAKE 1 TABLET BY MOUTH AT BEDTIME. ANNUAL APPT DUE IN MAY MUST SEE PROVIDER FOR FUTURE REFILLS 03/22/20   Biagio Borg, MD  ferrous sulfate 325 (65 FE) MG tablet Take 325 mg by mouth daily.  04/04/18   [provider]  gabapentin (NEURONTIN) 300 MG capsule TAKE 1 CAPSULE BY MOUTH TWICE A DAY 06/30/19   Biagio Borg, MD  hydrALAZINE (APRESOLINE) 25 MG tablet TAKE 1 TABLET (25 MG TOTAL) BY MOUTH EVERY 8 (EIGHT) HOURS. 12/12/19   Biagio Borg, MD  isosorbide mononitrate (IMDUR) 30 MG 24 hr tablet Take 1 tablet (30 mg total) by mouth daily. Annual appt is due must see provider for future refills 12/18/19   Biagio Borg, MD  lovastatin (MEVACOR) 40  MG tablet Take 1 tablet (40 mg total) by mouth at bedtime. 05/29/19   Biagio Borg, MD  meclizine (ANTIVERT) 25 MG tablet Take 0.5-1 tablets (12.5-25 mg total) by mouth as needed for dizziness. TAKE 1/2 - 1 TABLET BY MOUTH THREE TIMES A DAY AS NEEDED FOR DIZINESS 02/24/19   Biagio Borg, MD  nitroGLYCERIN (NITROSTAT) 0.4 MG SL tablet Place 1 tablet (0.4 mg total) under the tongue every 5 (five) minutes as needed for chest pain. 07/29/18   Burnell Blanks, MD  pantoprazole (PROTONIX) 40 MG tablet TAKE 1 TABLET BY MOUTH EVERY DAY 12/15/19   Biagio Borg, MD  pramipexole (MIRAPEX) 0.5 MG tablet TAKE 1 TABLET BY MOUTH 3 TIMES A DAY *ANNUAL APPT DUE IN MAY** 12/15/19   Biagio Borg, MD  predniSONE (DELTASONE) 5 MG tablet Take 5 mg by mouth at bedtime.     [provider]  Thiamine HCl (VITAMIN B1) 50 MG TABS TAKE 1 TABLET BY MOUTH DAILY. ANNUAL APPT DUE IN MAY MUST SEE PROVIDER FOR FUTURE REFILLS 02/26/20   Biagio Borg, MD  vitamin B-12 (CYANOCOBALAMIN) 1000 MCG tablet Take 1 tablet (1,000 mcg  total) by mouth daily. 05/29/19   Biagio Borg, MD  Vitamin D, Cholecalciferol, 50 MCG (2000 UT) CAPS Take 50 mcg by mouth daily.    [provider]    Physical Exam: Vitals:   03/22/20 1340  BP: (!) 157/91  Pulse: 81  Resp: (!) 22  Temp: 99.6 F (37.6 C)  TempSrc: Axillary  SpO2: 100%    Constitutional: Appears thin and lean, very dehydrated, drowsy. Eyes: PERRL, lids and conjunctivae normal ENMT: Mucous membranes are dry. Posterior pharynx clear of any exudate or lesions.Normal dentition.  Neck: normal, supple, no masses, no thyromegaly Respiratory: Tachypneic, coarse breath sounds noted bilaterally Cardiovascular: Regular rate and rhythm, no murmurs / rubs / gallops. No extremity edema. 2+ pedal pulses. No carotid bruits.  Abdomen: no tenderness, no masses palpated. No hepatosplenomegaly. Bowel sounds positive.  Musculoskeletal: no clubbing / cyanosis. No joint deformity upper and lower extremities. Good ROM, no contractures. Normal muscle tone.  Skin: no rashes, lesions, ulcers. No induration Neurologic: Unable to perform neurological exam as patient was drowsy   Labs on Admission: I have personally reviewed following labs and imaging studies  CBC: Recent Labs  Lab 03/22/20 1430  WBC 11.7*  NEUTROABS 10.2*  HGB 7.1*  HCT 23.4*  MCV 94.4  PLT 166*   Basic Metabolic Panel: Recent Labs  Lab 03/22/20 1430  NA 140  K 5.1  CL 106  CO2 18*  GLUCOSE 103*  BUN 109*  CREATININE 7.63*  CALCIUM 9.4   GFR: CrCl cannot be calculated (Unknown ideal weight.). Liver Function Tests: No results for input(s): AST, ALT, ALKPHOS, BILITOT, PROT, ALBUMIN in the last 168 hours. No results for input(s): LIPASE, AMYLASE in the last 168 hours. No results for input(s): AMMONIA in the last 168 hours. Coagulation Profile: No results for input(s): INR, PROTIME in the last 168 hours. Cardiac Enzymes: No results for input(s): CKTOTAL, CKMB, CKMBINDEX, TROPONINI in the last  168 hours. BNP (last 3 results) No results for input(s): PROBNP in the last 8760 hours. HbA1C: No results for input(s): HGBA1C in the last 72 hours. CBG: No results for input(s): GLUCAP in the last 168 hours. Lipid Profile: No results for input(s): CHOL, HDL, LDLCALC, TRIG, CHOLHDL, LDLDIRECT in the last 72 hours. Thyroid Function Tests: No results for input(s): TSH, T4TOTAL, FREET4,  T3FREE, THYROIDAB in the last 72 hours. Anemia Panel: No results for input(s): VITAMINB12, FOLATE, FERRITIN, TIBC, IRON, RETICCTPCT in the last 72 hours. Urine analysis:    Component Value Date/Time   COLORURINE YELLOW 11/16/2019 1608   APPEARANCEUR HAZY (A) 11/16/2019 1608   LABSPEC 1.013 11/16/2019 1608   PHURINE 7.0 11/16/2019 1608   GLUCOSEU NEGATIVE 11/16/2019 1608   GLUCOSEU NEGATIVE 12/25/2018 1043   HGBUR NEGATIVE 11/16/2019 1608   BILIRUBINUR NEGATIVE 11/16/2019 1608   KETONESUR NEGATIVE 11/16/2019 1608   PROTEINUR >=300 (A) 11/16/2019 1608   UROBILINOGEN 0.2 12/25/2018 1043   NITRITE NEGATIVE 11/16/2019 1608   LEUKOCYTESUR MODERATE (A) 11/16/2019 1608    Radiological Exams on Admission: DG Chest 1 View  Result Date: 03/22/2020 CLINICAL DATA:  Decreased mental status EXAM: CHEST  1 VIEW COMPARISON:  11/16/2019 FINDINGS: Mild cardiomegaly. Bibasilar opacities, likely atelectasis. No overt edema. No effusions or acute bony abnormality. Advanced degenerative changes in the shoulders. Aortic atherosclerosis. IMPRESSION: Cardiomegaly. Bibasilar opacities, likely atelectasis. Electronically Signed   By: Rolm Baptise M.D.   On: 03/22/2020 15:25    Assessment/Plan Principal Problem:   Acute metabolic encephalopathy Active Problems:   Hyperlipidemia   PROSTATE CANCER, HX OF   Hypertension   Acute on chronic kidney failure (HCC)   Dementia (HCC)   Depression   Thrombocytopenia (HCC)   Anemia due to chronic kidney disease   Generalized weakness    Acute metabolic encephalopathy: -Likely  in the setting of dehydration/worsening kidney function/uremia/UTI -Patient is afebrile with leukocytosis of 11.7, COVID-19 pending.  Lactic acid: WNL -CMP shows worsening kidney function.  UA positive for leukocyte/WBC/bacteria.  Reviewed chest x-ray -Patient received IV fluid and Rocephin in the ED.   blood culture and urine culture: Pending -Admit patient on the floor. -Continue IV fluids.  We will keep him n.p.o as patient is drowsy. -Check B12, folate, TSH, ammonia level -Consult PT/OT/SLP -Avoid nephrotoxic medications.  Monitor kidney function and vitals closely -On fall/aspiration precautions.  AKI on CKD stage IV: -BUN: 109, creatinine: 7.63, GFR: 7 (baseline creatinine: 4.36, GFR: 14) -Continue IV fluids.  Hold nephrotoxic medication.  Repeat BMP tomorrow a.m. -Patient has dementia and metastatic prostate carcinoma-he will be poor candidate for dialysis.  Will hold off nephrology consult for now.  Leukocytosis: WBC of 11.7 - Lactic acid: WNL.  UA is positive for infection.  Urine culture and blood culture: Pending, COVID-19 pending.  Reviewed chest x-ray -Received Rocephin in ED. -Continue IV Rocephin for now.  Follow urine culture result.  History of metastatic prostate carcinoma: -Currently on home hospice. -Hospice team will continue to follow.  Hypertension: Hold home BP meds-amlodipine, hydralazine, Imdur as patient is n.p.o. -Hydralazine as needed for blood pressure more than 160/100  Depression: Hold Celexa  Hyperlipidemia: Hold statin  GERD: Hold PPI  Restless leg syndrome: Hold Mirapex  Anemia of chronic disease: Hold ferrous sulfate  Dementia: Hold Aricept  Coronary artery disease: Hold statin, nitro, Imdur  Thrombocytopenia: Chronic.  Platelet: 120.  No signs of active bleeding. -Repeat CBC tomorrow a.m.  Patient is on increased risk of morbidity and mortality.  DVT prophylaxis: Heparin subcu/SCD Code Status: DNR Family Communication: None present  at bedside.  Plan of care discussed with patient in length and he verbalized understanding and agreed with it.  I called patient's wife and discussed goals of care.  She is okay with IV fluids and IV antibiotics.  She does not want heroic measures-including dialysis.  She would like to be called  if any change in patient's condition.  Confirmed CODE STATUS with her: She is okay with DNR status.  Disposition Plan: Hospice as soon as beds are available Consults called: None Admission status: Inpatient   Mckinley Jewel MD [facility]  If 7PM-7AM, please contact night-coverage [email or web address] Password [facility]  03/22/2020, 4:52 PM

## 2020-03-22 NOTE — ED Notes (Signed)
Victor Davis wife [phone] would like a call when he gets upstairs

## 2020-03-22 NOTE — ED Provider Notes (Signed)
Patient care assumed at 1500. Patient presents the emergency department from home for increased lethargy and poor oral intake. Currently on home hospice for metastatic prostate cancer. Wife states that at baseline he is able to ambulate to the bathroom but is now unable to get up and had a fall yesterday. He is dehydrated appearing on evaluation and he was treated with IV fluids. Wife is agreeable with IV fluid hydration as well as antibiotics for infection but does not want any heroic measures. Labs with progressive renal failure. CBC with mild leukocytosis. Chest x-ray with questionable infiltrates, will treat with antibiotics for possible pneumonia versus UTI. Discussed with hospice coordinator - [facility] with no beds available at this time, medicine consulted for admission for ongoing treatment. Wife updated findings of studies and she is in agreement with treatment plan.   Quintella Reichert, MD 03/22/20 [phone]

## 2020-03-23 LAB — COMPREHENSIVE METABOLIC PANEL
ALT: 9 U/L (ref 0–44)
AST: 17 U/L (ref 15–41)
Albumin: 2.6 g/dL — ABNORMAL LOW (ref 3.5–5.0)
Alkaline Phosphatase: 55 U/L (ref 38–126)
Anion gap: 17 — ABNORMAL HIGH (ref 5–15)
BUN: 105 mg/dL — ABNORMAL HIGH (ref 8–23)
CO2: 15 mmol/L — ABNORMAL LOW (ref 22–32)
Calcium: 8.7 mg/dL — ABNORMAL LOW (ref 8.9–10.3)
Chloride: 111 mmol/L (ref 98–111)
Creatinine, Ser: 7.56 mg/dL — ABNORMAL HIGH (ref 0.61–1.24)
GFR calc Af Amer: 7 mL/min — ABNORMAL LOW (ref >60)
GFR calc non Af Amer: 6 mL/min — ABNORMAL LOW (ref >60)
Glucose, Bld: 80 mg/dL (ref 70–99)
Potassium: 5.2 mmol/L — ABNORMAL HIGH (ref 3.5–5.1)
Sodium: 143 mmol/L (ref 135–145)
Total Bilirubin: 0.5 mg/dL (ref 0.3–1.2)
Total Protein: 5.9 g/dL — ABNORMAL LOW (ref 6.5–8.1)

## 2020-03-23 LAB — BASIC METABOLIC PANEL
Anion gap: 14 (ref 5–15)
BUN: 109 mg/dL — ABNORMAL HIGH (ref 8–23)
CO2: 15 mmol/L — ABNORMAL LOW (ref 22–32)
Calcium: 8.9 mg/dL (ref 8.9–10.3)
Chloride: 115 mmol/L — ABNORMAL HIGH (ref 98–111)
Creatinine, Ser: 7.68 mg/dL — ABNORMAL HIGH (ref 0.61–1.24)
GFR calc Af Amer: 7 mL/min — ABNORMAL LOW (ref >60)
GFR calc non Af Amer: 6 mL/min — ABNORMAL LOW (ref >60)
Glucose, Bld: 117 mg/dL — ABNORMAL HIGH (ref 70–99)
Potassium: 5.1 mmol/L (ref 3.5–5.1)
Sodium: 144 mmol/L (ref 135–145)

## 2020-03-23 LAB — CBC
HCT: 22.6 % — ABNORMAL LOW (ref 39.0–52.0)
HCT: 23.4 % — ABNORMAL LOW (ref 39.0–52.0)
Hemoglobin: 6.9 g/dL — CL (ref 13.0–17.0)
Hemoglobin: 7.1 g/dL — ABNORMAL LOW (ref 13.0–17.0)
MCH: 29.4 pg (ref 26.0–34.0)
MCH: 29 pg (ref 26.0–34.0)
MCHC: 30.5 g/dL (ref 30.0–36.0)
MCHC: 30.3 g/dL (ref 30.0–36.0)
MCV: 96.2 fL (ref 80.0–100.0)
MCV: 95.5 fL (ref 80.0–100.0)
Platelets: 124 10*3/uL — ABNORMAL LOW (ref 150–400)
Platelets: 128 10*3/uL — ABNORMAL LOW (ref 150–400)
RBC: 2.35 MIL/uL — ABNORMAL LOW (ref 4.22–5.81)
RBC: 2.45 MIL/uL — ABNORMAL LOW (ref 4.22–5.81)
RDW: 13.4 % (ref 11.5–15.5)
RDW: 13.9 % (ref 11.5–15.5)
WBC: 13.5 10*3/uL — ABNORMAL HIGH (ref 4.0–10.5)
WBC: 13.6 10*3/uL — ABNORMAL HIGH (ref 4.0–10.5)
nRBC: 0 % (ref 0.0–0.2)
nRBC: 0 % (ref 0.0–0.2)

## 2020-03-23 MED ORDER — DEXTROSE-NACL 5-0.45 % IV SOLN: INTRAVENOUS | Status: DC

## 2020-03-23 MED ORDER — FERROUS SULFATE 325 (65 FE) MG PO TABS
325.0000 mg | ORAL_TABLET | Freq: Every day | ORAL | Status: DC
  Filled 2020-03-23: qty 1

## 2020-03-23 MED ORDER — SODIUM CHLORIDE 0.9 % IV SOLN
1.0000 g | INTRAVENOUS | Status: DC
  Administered 2020-03-23 – 2020-03-24 (×2): 1 g via INTRAVENOUS
  Filled 2020-03-23 (×3): qty 1

## 2020-03-23 MED ORDER — HYDRALAZINE HCL 20 MG/ML IJ SOLN: 5.0000 mg | Freq: Four times a day (QID) | INTRAMUSCULAR | Status: DC | PRN

## 2020-03-23 MED ORDER — METOPROLOL TARTRATE 5 MG/5ML IV SOLN
5.0000 mg | Freq: Every day | INTRAVENOUS | Status: DC
  Administered 2020-03-23 – 2020-03-25 (×3): 5 mg via INTRAVENOUS
  Filled 2020-03-23 (×3): qty 5

## 2020-03-23 NOTE — Progress Notes (Signed)
Called spouses number twice with no answer and voice message is full

## 2020-03-23 NOTE — Progress Notes (Signed)
[facility])  [name] is our current hospice patient.    Family would like [facility] for end of life.  [facility] does not have a bed for him today.  Will continue to follow while hospitalized and update family, hospital once bed is available at [facility].  Venia Carbon BSN, RN, [facility] (in [place])

## 2020-03-23 NOTE — Progress Notes (Signed)
[place] [address] [facility]) Hospitalized Hospice Patient  Victor Davis is a current hospice patient with a terminal diagnosis of prostate cancer. His wife reports that on 8/9 he began acting more confused and she felt he was dehydrated. She elected to contact EMS so the patient could be transported to the hospital for further evaluation.  He is admitted with acute metabolic encephalopathy. This is a related hospital admission per Dr. Karie Georges with [facility].  Pt asleep, appears comfortable, does not respond to my visit. Wife at the bedside. Discussed with wife what the plan was and she advised she wanted to see if he would "perk up" some for him to return home.  Discussed [place] for end of life care, she advised that she is not against it but would like to see if the IV fluids and abx would be helpful.  Discussed that we will continue with what the medical team is advising and to treat the treatable.    V/S:  99.1 ax, 197/71, HR 97, RR 16, SPO2 97% RA I&O:  1100 / none recorded yet Labs:  K 5.2, Cr 7.56, phos 5.4, albumin 2.6, gfr 6, WBC 13.5, RBC 2.35, hgb 6.9, HCT 22.6, plt 124, UA-cloudy +glucose, moderate leukocytes Diagnostics:  CXR- Cardiomegaly. Bibasilar opacities, likely atelectasis. IVs/PRNs: NS @ 125 ml/hr IV, rocephin 1 g IV QD, apresoline 10 mg IV x 2  Problem List: -AKI on CKD - baseline Cr 4.3, NS @ 125/hr, not a candidate for HD, holding nephrotoxic drugs for now -leukocytosis - likely urosepsis, rocephin as above -prostate cancer - stable  GOC: currently DNR, goals appear clear, wife wants to treat the treatable but understands that there are limits to what can be treated D/C plan: ongoing. Pt from home, wife wants him to return home. Unclear if he will rebound enough to return home safely. [place] was discussed. Family: present and updated IDT: team updated  Transfer summary and med list on shadow chart.  Venia Carbon BSN, RN, [facility]ia[facility] (in  [place] under hospice)

## 2020-03-23 NOTE — Progress Notes (Addendum)
PROGRESS NOTE  Victor Davis:[id number] DOB: 1940-11-17 DOA: 03/22/2020 PCP: Biagio Borg, MD  HPI/Recap of past 24 hours: HPI: Victor Davis is a 79 y.o. male with medical history significant of metastatic prostate cancer, anemia of chronic disease, chronic thrombocytopenia, CKD stage IIIb, hypertension, hyperlipidemia, coronary artery disease status post stents to LAD,Lcx in 02/2012, carotid artery stenosis, CVA, dementia brought by EMS to the emergency department with generalized weakness and confusion.  Patient is currently on home hospice.  Wife reports that patient has worsening mental status since yesterday, has been lethargic, decreased appetite, has not been eating or drinking since past 2 days, unable to walk due to generalized weakness, yesterday evening he became less responsive, difficult to arouse and his symptoms worsened this morning therefore she called 911 and brought patient to the emergency department for further evaluation and management for possible IV fluids and IV antibiotics but does not want any heroic measures.  ED physician discussed with hospice coordinator-[facility] with no beds available at this time.  No history of head trauma, seizures, loss of consciousness, fever, chills, cough, congestion, nausea, vomiting, diarrhea.  ED Course: Upon arrival to ED: Patient tachypneic in 20s, blood pressure 157/91, afebrile with leukocytosis of 11.7, CBC shows anemia of chronic disease, platelet: 20, CMP shows worsening kidney function, potassium: WNL, anion gap: 16.  Lactic acid: WNL.  Chest x-ray shows cardiomegaly, bibasilar opacity likely atelectasis.  Blood culture, UA, COVID-19 pending.  Patient received IV fluid bolus and Rocephin in the ED.  [facility] hospitalist consulted for admission.  03/23/20: Seen and examined.  Minimally interactive. Somnolent and confused when he arouses.  Per documentation, patient is a hospice patient.  Per [employer], patient's  spouse would like to treat the treatable.   Assessment/Plan: Principal Problem:   Acute metabolic encephalopathy Active Problems:   Hyperlipidemia   PROSTATE CANCER, HX OF   Hypertension   Acute on chronic kidney failure (HCC)   Dementia (HCC)   Depression   Thrombocytopenia (HCC)   Anemia due to chronic kidney disease   Generalized weakness  Acute metabolic encephalopathy multifactorial: -Minimally interactive, family wants to treat the treatable -Encephalopathy likely multifactorial in the setting of dehydration/worsening kidney function/uremia/UTI -Per documentation, patient is a hospice patient.  Per [employer], patient's spouse -would like to treat the treatable. -Continue IV fluid and IV antibiotics empirically. -Speech therapist consulted to evaluate safety in swallowing. 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   -Avoid nephrotoxic agents  -Patient is a hospice patient, has dementia and metastatic prostate carcinoma-he is a poor candidate for dialysis.   -Will hold off nephrology consult for now. -Monitor urine output  Iron deficiency anemia Hg drop 6.9 from 7.2 Will repeat CBC to confirm PTA po iron supplement on hold due to NPO  Hyperkalemia likely secondary to acute renal failure Presented with serum potassium 5.2 Continue IV fluid and repeat BMP this afternoon  Sepsis secondary presumed UTI, history of pansensitive Pseudomonas aeruginosa UTI 11/16/19 Presented with leukocytosis, WBC 13.1K,  Respiration rate 24, and pyuria UA positive for pyuria Urine culture ordered, pending Blood cultures negative today, continue to follow cultures Previous urine culture positive for Pseudomonas aeruginosa, pansensitive 4/421 Change IV antibiotics to cefepime to cover  Pseudomonas. DC Rocephin Continue IV fluid D5 1/2NS at 100 cc/hr x 2 days  History of  metastatic prostate carcinoma: -Presented from home hospice -Hospice team will continue to follow.  Essential hypertension:  Hold oral antihypertensives after passing swallow evaluation  Start IV Lopressor 5 mg daily to avoid stacking in the setting of severe renal failure. -Hydralazine as needed for blood pressure more than 160/100 Continue to monitor vital signs  Depression/hyperlipidemia/dementia/coronary artery disease:  Hold oral medications due to n.p.o.  Severe protein calorie malnutrition Low BMI Muscle mass loss Albumin 2.6 N.p.o. due to encephalopathy, needs to pass swallow evaluation  Patient is at increased risk of morbidity and mortality.  DVT prophylaxis: Heparin subcu/SCD Code Status: DNR Family Communication:  None at bedside.  Admitting physician called patient's wife and discussed goals of care.  She is okay with IV fluids and IV antibiotics.  She does not want heroic measures-including dialysis.  She would like to be called if any change in patient's condition.    Disposition Plan: Hospice as soon as beds are available Consults called: None   Status is: Inpatient    Dispo: The patient is from: Home hospice.               Anticipated d/c is to: [facility] if bed is available               Anticipated d/c date is: 03/25/2020               Patient currently not stable for discharge due to ongoing treatment for sepsis secondary to presumed UTI.       Objective: Vitals:   03/23/20 0701 03/23/20 1104 03/23/20 1148 03/23/20 1534  BP: (!) 162/71 (!) 179/64 (!) 197/71 (!) 178/63  Pulse:  84 97 79  Resp:  16 16 20   Temp:  99.5 F (37.5 C) 99.1 F (37.3 C) 98.2 F (36.8 C)  TempSrc:  Axillary Axillary Oral  SpO2:  94% 97% 96%    Intake/Output Summary (Last 24 hours) at 03/23/2020 1542 Last data filed at 03/22/2020 1742 Gross per 24 hour  Intake 1100 ml   Output --  Net 1100 ml   There were no vitals filed for this visit.  Exam:  . General: 79 y.o. year-old male minimally interactive.  In no acute distress.  Arousable to voices.   . Cardiovascular: Regular rate and rhythm with no rubs or gallops.  No thyromegaly or JVD noted.   [name] Respiratory: Clear to auscultation with no wheezes or rales. Good inspiratory effort. . Abdomen: Soft nontender nondistended with normal bowel sounds x4 quadrants. . Musculoskeletal: No lower extremity edema bilaterally. [name] Psychiatry: Mood is appropriate for condition and setting   Data Reviewed: CBC: Recent Labs  Lab 03/22/20 1430 03/22/20 1900 03/23/20 0409  WBC 11.7* 13.1* 13.5*  NEUTROABS 10.2*  --   --   HGB 7.1* 7.2* 6.9*  HCT 23.4* 24.0* 22.6*  MCV 94.4 97.2 96.2  PLT 120* 126* 454*   Basic Metabolic Panel: Recent Labs  Lab 03/22/20 1430 03/22/20 1900 03/23/20 0409  NA 140  --  143  K 5.1  --  5.2*  CL 106  --  111  CO2 18*  --  15*  GLUCOSE 103*  --  80  BUN 109*  --  105*  CREATININE 7.63* 7.19* 7.56*  CALCIUM 9.4  --  8.7*  MG  --  2.0  --   PHOS  --  5.4*  --    GFR: CrCl cannot be calculated (Unknown ideal weight.). Liver Function Tests: Recent Labs  Lab  03/23/20 0409  AST 17  ALT 9  ALKPHOS 55  BILITOT 0.5  PROT 5.9*  ALBUMIN 2.6*   No results for input(s): LIPASE, AMYLASE in the last 168 hours. Recent Labs  Lab 03/22/20 1900  AMMONIA 16   Coagulation Profile: No results for input(s): INR, PROTIME in the last 168 hours. Cardiac Enzymes: No results for input(s): CKTOTAL, CKMB, CKMBINDEX, TROPONINI in the last 168 hours. BNP (last 3 results) No results for input(s): PROBNP in the last 8760 hours. HbA1C: No results for input(s): HGBA1C in the last 72 hours. CBG: No results for input(s): GLUCAP in the last 168 hours. Lipid Profile: No results for input(s): CHOL, HDL, LDLCALC, TRIG, CHOLHDL, LDLDIRECT in the last 72 hours. Thyroid Function Tests: No  results for input(s): TSH, T4TOTAL, FREET4, T3FREE, THYROIDAB in the last 72 hours. Anemia Panel: Recent Labs    03/22/20 1900  VITAMINB12 794  FOLATE 16.9   Urine analysis:    Component Value Date/Time   COLORURINE YELLOW 03/22/2020 1630   APPEARANCEUR CLOUDY (A) 03/22/2020 1630   LABSPEC 1.010 03/22/2020 1630   PHURINE 7.0 03/22/2020 1630   GLUCOSEU NEGATIVE 03/22/2020 1630   GLUCOSEU NEGATIVE 12/25/2018 1043   HGBUR MODERATE (A) 03/22/2020 1630   BILIRUBINUR NEGATIVE 03/22/2020 1630   KETONESUR NEGATIVE 03/22/2020 1630   PROTEINUR 100 (A) 03/22/2020 1630   UROBILINOGEN 0.2 12/25/2018 1043   NITRITE NEGATIVE 03/22/2020 1630   LEUKOCYTESUR MODERATE (A) 03/22/2020 1630   Sepsis Labs: @LABRCNTIP (procalcitonin:4,lacticidven:4)  ) Recent Results (from the past 240 hour(s))  Blood culture (routine x 2)     Status: None (Preliminary result)   Collection Time: 03/22/20  2:15 PM   Specimen: BLOOD  Result Value Ref Range Status   Specimen Description BLOOD RIGHT ANTECUBITAL  Final   Special Requests   Final    BOTTLES DRAWN AEROBIC AND ANAEROBIC Blood Culture results may not be optimal due to an inadequate volume of blood received in culture bottles   Culture   Final    NO GROWTH < 24 HOURS Performed at [facility], [place] [address], [place], [place] [address]    Report Status PENDING  Incomplete  Blood culture (routine x 2)     Status: None (Preliminary result)   Collection Time: 03/22/20  2:39 PM   Specimen: BLOOD  Result Value Ref Range Status   Specimen Description BLOOD LEFT ANTECUBITAL  Final   Special Requests   Final    BOTTLES DRAWN AEROBIC AND ANAEROBIC Blood Culture results may not be optimal due to an inadequate volume of blood received in culture bottles   Culture   Final    NO GROWTH < 24 HOURS Performed at [facility], [place] [address], [place], [place] [address]    Report Status PENDING  Incomplete  SARS Coronavirus 2 by RT PCR (hospital order,  performed in [facility] hospital lab) Nasopharyngeal Nasopharyngeal Swab     Status: None   Collection Time: 03/22/20  4:30 PM   Specimen: Nasopharyngeal Swab  Result Value Ref Range Status   SARS Coronavirus 2 NEGATIVE NEGATIVE Final    Comment: (NOTE) SARS-CoV-2 target nucleic acids are NOT DETECTED.  The SARS-CoV-2 RNA is generally detectable in upper and lower respiratory specimens during the acute phase of infection. The lowest concentration of SARS-CoV-2 viral copies this assay can detect is 250 copies / mL. A negative result does not preclude SARS-CoV-2 infection and should not be used as the sole basis for treatment  or other patient management decisions.  A negative result may occur with improper specimen collection / handling, submission of specimen other than nasopharyngeal swab, presence of viral mutation(s) within the areas targeted by this assay, and inadequate number of viral copies (<250 copies / mL). A negative result must be combined with clinical observations, patient history, and epidemiological information.  Fact Sheet for Patients:   StrictlyIdeas.no  Fact Sheet for Healthcare Providers: BankingDealers.co.za  This test is not yet approved or  cleared by the [place] FDA and has been authorized for detection and/or diagnosis of SARS-CoV-2 by FDA under an Emergency Use Authorization (EUA).  This EUA will remain in effect (meaning this test can be used) for the duration of the COVID-19 declaration under Section 564(b)(1) of the Act, 21 U.S.C. section 360bbb-3(b)(1), unless the authorization is terminated or revoked sooner.  Performed at [facility], [place] [address], [place], [place] [address]       Studies: No results found.  Scheduled Meds: . ferrous sulfate  325 mg Oral Q breakfast  . heparin  5,000 Units Subcutaneous Q8H    Continuous Infusions: . sodium chloride 125 mL/hr at 03/23/20 0728   . cefTRIAXone (ROCEPHIN)  IV       LOS: 1 day     Kayleen Memos, MD [facility] Pager [phone]  If 7PM-7AM, please contact night-coverage [email or web address] Password [email or web address] 03/23/2020, 3:42 PM

## 2020-03-23 NOTE — Social Work (Signed)
CSW acknowledging consult for comfort care. Will follow for disposition should hospice services or residential hospice become appropriate. Aware pt is under [facility] and preference is for [facility], no beds at this time.   [place], MSW, [place] Work

## 2020-03-23 NOTE — Progress Notes (Signed)
PT Cancellation Note  Patient Details Name: Victor Davis MRN: [id number] DOB: Oct 05, 1940   Cancelled Treatment:    Reason Eval/Treat Not Completed: PT screened, no needs identified, will sign off Pt currently on hospice services and pursuing residential hospice. No acute PT needs. Will sign off. If needs change, please re-consult.   Reuel Derby, PT, DPT  Acute Rehabilitation Services  Pager: [phone] Office: [phone]    Rudean Hitt 03/23/2020, 11:00 AM

## 2020-03-23 NOTE — Progress Notes (Signed)
Pharmacy Antibiotic Note  Victor Davis is a 79 y.o. male admitted on 03/22/2020 with generalized weakness and confusion with concern of UTI.  Pt initially started on Rocephin however given hx of pseudomonas UTI (Apr 2021) pharmacy has been consulted for Cefepime dosing.  Pt also has noted AoCKD with SCr up to 7.5 (baseline 4.5).  CrCl <10 ml/min.  Plan: Cefepime 1gm IV q24h     Temp (24hrs), Avg:98.7 F (37.1 C), Min:98 F (36.7 C), Max:99.5 F (37.5 C)  Recent Labs  Lab 03/22/20 1430 03/22/20 1900 03/23/20 0409  WBC 11.7* 13.1* 13.5*  CREATININE 7.63* 7.19* 7.56*  LATICACIDVEN 0.7 0.6  --     CrCl cannot be calculated (Unknown ideal weight.).    Allergies  Allergen Reactions  . Amitriptyline Hcl Other (See Comments)    REACTION: confusion  . Diphenhydramine Hcl Other (See Comments)    Keeps him awake  . Simvastatin Other (See Comments)    REACTION: leg pain  . Clonidine Derivatives     Dizziness, falls, bradycardia  . Tylenol [Acetaminophen] Other (See Comments)    Dr advised pt not to take due to finding a spot on pt's kidney    Antimicrobials this admission: Rocephin x 1 8/9 Cefepime 8/10 >>  Dose adjustments this admission:   Microbiology results: 8/9 BCx: ngtd  Thank you for allowing pharmacy to be a part of this patient's care.  [employer], Pharm.D., BCPS Clinical Pharmacist  **Pharmacist phone directory can be found on [email or web address] listed under [place].  03/23/2020 4:45 PM

## 2020-03-24 DIAGNOSIS — N184 Chronic kidney disease, stage 4 (severe): Secondary | ICD-10-CM

## 2020-03-24 DIAGNOSIS — R531 Weakness: Secondary | ICD-10-CM

## 2020-03-24 DIAGNOSIS — I10 Essential (primary) hypertension: Secondary | ICD-10-CM

## 2020-03-24 DIAGNOSIS — F0150 Vascular dementia without behavioral disturbance: Secondary | ICD-10-CM

## 2020-03-24 DIAGNOSIS — Z8546 Personal history of malignant neoplasm of prostate: Secondary | ICD-10-CM

## 2020-03-24 DIAGNOSIS — D696 Thrombocytopenia, unspecified: Secondary | ICD-10-CM

## 2020-03-24 DIAGNOSIS — G9341 Metabolic encephalopathy: Secondary | ICD-10-CM

## 2020-03-24 DIAGNOSIS — N179 Acute kidney failure, unspecified: Secondary | ICD-10-CM

## 2020-03-24 LAB — CBC WITH DIFFERENTIAL/PLATELET
Abs Immature Granulocytes: 0.11 10*3/uL — ABNORMAL HIGH (ref 0.00–0.07)
Basophils Absolute: 0 10*3/uL (ref 0.0–0.1)
Basophils Relative: 0 %
Eosinophils Absolute: 0 10*3/uL (ref 0.0–0.5)
Eosinophils Relative: 0 %
HCT: 22.1 % — ABNORMAL LOW (ref 39.0–52.0)
Hemoglobin: 6.8 g/dL — CL (ref 13.0–17.0)
Immature Granulocytes: 1 %
Lymphocytes Relative: 5 %
Lymphs Abs: 0.6 10*3/uL — ABNORMAL LOW (ref 0.7–4.0)
MCH: 28.8 pg (ref 26.0–34.0)
MCHC: 30.8 g/dL (ref 30.0–36.0)
MCV: 93.6 fL (ref 80.0–100.0)
Monocytes Absolute: 0.6 10*3/uL (ref 0.1–1.0)
Monocytes Relative: 5 %
Neutro Abs: 11.7 10*3/uL — ABNORMAL HIGH (ref 1.7–7.7)
Neutrophils Relative %: 89 %
Platelets: 124 10*3/uL — ABNORMAL LOW (ref 150–400)
RBC: 2.36 MIL/uL — ABNORMAL LOW (ref 4.22–5.81)
RDW: 14 % (ref 11.5–15.5)
WBC: 13 10*3/uL — ABNORMAL HIGH (ref 4.0–10.5)
nRBC: 0 % (ref 0.0–0.2)

## 2020-03-24 LAB — BASIC METABOLIC PANEL
Anion gap: 16 — ABNORMAL HIGH (ref 5–15)
BUN: 109 mg/dL — ABNORMAL HIGH (ref 8–23)
CO2: 16 mmol/L — ABNORMAL LOW (ref 22–32)
Calcium: 9.1 mg/dL (ref 8.9–10.3)
Chloride: 115 mmol/L — ABNORMAL HIGH (ref 98–111)
Creatinine, Ser: 7.75 mg/dL — ABNORMAL HIGH (ref 0.61–1.24)
GFR calc Af Amer: 7 mL/min — ABNORMAL LOW (ref >60)
GFR calc non Af Amer: 6 mL/min — ABNORMAL LOW (ref >60)
Glucose, Bld: 114 mg/dL — ABNORMAL HIGH (ref 70–99)
Potassium: 5 mmol/L (ref 3.5–5.1)
Sodium: 147 mmol/L — ABNORMAL HIGH (ref 135–145)

## 2020-03-24 NOTE — Progress Notes (Signed)
[place] [address] [facility]) Hospitalized Hospice Patient  [name] is a current hospice patient with a terminal diagnosis of prostate cancer. His wife reports that on 8/9 he began acting more confused and she felt he was dehydrated. She elected to contact EMS so the patient could be transported to the hospital for further evaluation.  He is admitted with acute metabolic encephalopathy. This is a related hospital admission per Dr. Karie Georges with [facility].  Pt not responsive to my visit, periods of abnormal breathing noted, mouth open, appears comfortable.  Wife not present, she has to wait on public transport to get to the hospital. Discussed that his kidney functions is worse in spite of holding nephrotoxic medications and attempting fluid resuscitation. Discussed that he appears to have further declined and will not return to his baseline.  Discussed transitioning him to [facility] for end of life care, Victor Davis is tearful but states that she "knew he was dying". She is unable to provide the care he needs at home and agrees with transitioning to [facility] once a bed is available. We did not discuss stopping lab draws/abx, wife was crying and relaying conversation to another person in the room at her home.  V/S:  98.2 oral, 179/70, HR 96, RR 14, SPO2 96% on RA I&O: 429/1100 Labs:  Na 147, BUN 109, Cr 7.75, CrCl 5.7, GFR 6, WBC 13, hgb 6.8 Diagnostics: none IVs/PRNs: maxipeme 1g IV QD, D51/2NS currently at [facility]  Problem List: -AKI on CKD - kidney function worsening, nephrotoxic drugs held, fluid resuscitation did not reverse -Anemia - hgb 6.8, no plans to transfuse -Prostate cancer - pt is actively dying  [place]ally:  They appear clear.  Wife understands his illness and was hopeful for him to improve enough to go home.  He is total care and unresponsive and cannot return home.  She understands he will not improve enough to go home and that he is dying.  D/C planning:  [facility] for EOL care, if  he does not have a hospital death while waiting Family: updated Victor Davis via phone, hospice will continue to support her IDT: team updated  Venia Carbon BSN, RN, [facility] Liaison

## 2020-03-24 NOTE — Progress Notes (Signed)
PROGRESS NOTE    Victor Davis  KXF:[id number] DOB: 15-Jul-1941 DOA: 03/22/2020 PCP: Biagio Borg, MD  Brief Narrative:  HPI per Dr. Soyla Murphy on 03/22/20 Victor Davis is a 79 y.o. male with medical history significant of metastatic prostate cancer, anemia of chronic disease, chronic thrombocytopenia, CKD stage IIIb, hypertension, hyperlipidemia, coronary artery disease status post stents to LAD,Lcx in 02/2012, carotid artery stenosis, CVA, dementia brought by EMS to the emergency department with generalized weakness and confusion.  Patient is currently on home hospice.  Wife reports that patient has worsening mental status since yesterday, has been lethargic, decreased appetite, has not been eating or drinking since past 2 days, unable to walk due to generalized weakness, yesterday evening he became less responsive, difficult to arouse and his symptoms worsened this morning therefore she called 911 and brought patient to the emergency department for further evaluation and management for possible IV fluids and IV antibiotics but does not want any heroic measures.  ED physician discussed with hospice coordinator-[facility] with no beds available at this time.  No history of head trauma, seizures, loss of consciousness, fever, chills, cough, congestion, nausea, vomiting, diarrhea.  ED Course: Upon arrival to ED: Patient tachypneic in 20s, blood pressure 157/91, afebrile with leukocytosis of 11.7, CBC shows anemia of chronic disease, platelet: 20, CMP shows worsening kidney function, potassium: WNL, anion gap: 16.  Lactic acid: WNL.  Chest x-ray shows cardiomegaly, bibasilar opacity likely atelectasis.  Blood culture, UA, COVID-19 pending.  Patient received IV fluid bolus and Rocephin in the ED.  [facility] hospital[facility] consulted for admission.  **Interim History  Patient continues to be minimally interactive and somnolent.  He does not really arouse today and SLP evaluated and recommending  strict n.p.o.  Patient is a hospice patient and patient's family still would like to treat the treatable however his encephalopathy has not improved with antibiotics and his renal function is worsening today.  He has a terminal diagnosis of prostate cancer and he has been more confused and wife felt that he was dehydrated.  He is not candidate for hemodialysis and antibiotics have been broadened.  I do not feel the patient can return home safely and feel the best option would be for residential hospice placement.  Assessment & Plan:   Principal Problem:   Acute metabolic encephalopathy Active Problems:   Hyperlipidemia   PROSTATE CANCER, HX OF   Hypertension   Acute on chronic kidney failure (HCC)   Dementia (HCC)   Depression   Thrombocytopenia (HCC)   Anemia due to chronic kidney disease   Generalized weakness  Acute metabolic encephalopathy multifactorial: -Minimally interactive, family wants to treat the treatable -Encephalopathy likely multifactorial in the setting of dehydration/worsening kidney function/uremia/UTI but despite fluid resuscitation and treatment IV antibiotic patient's AMS is still not improving -Per documentation, patient is a hospice patient.  Per [employer], patient's spouse -would like to treat the treatable but I feel that this may be a terminal event given that he is not improving. -Patient sodium is elevated at 147 -Continue IV fluid and IV antibiotics empirically. -Speech therapist consulted to evaluate safety in swallowing and recommending strict n.p.o. 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  -Patient is a hospice patient, has dementia and metastatic prostate carcinoma-he is a poor candidate for dialysis.  -Patient's CO2 is 16, chloride level 115, anion gap is 16 -Will hold off nephrology consult for now. -Monitor urine output -Avoid nephrotoxic medications, contrast dyes, hypotension and renally adjust medications X-repeat CMP in the a.m. if family wants to continue monitor his renal function  Iron deficiency anemia/anemia of chronic disease -Hg drop 6.9 from 7.2; now patient's hemoglobin/hematocrit is 6.8/22.1 -PTA po iron supplement on hold due to NPO -We will need to discuss with the family about blood transfusion -Currently no signs and symptoms of bleeding Repeat CBC in a.m.  Hyperkalemia likely secondary to acute renal failure -Presented with serum potassium 5.2 -Continue IV fluid and repeat potassium was 5.0 -Continue to monitor and trend and repeat CMP in a.m. if any losses to continue monitor his renal function  Sepsis secondary presumed UTI, history of pansensitive Pseudomonas aeruginosa UTI 11/16/19 -Presented with leukocytosis, WBC 13.1K,  Respiration rate 24, and pyuria -UA positive for pyuria and showed a cloudy appearance with moderate hemoglobin, moderate leukocytes, 100 protein, many bacteria, 21-50 RBCs per high-power field, and greater than 50 WBCs -Urine culture ordered, still pending -WBC went from 11.7 trended up to 13.6 and is now trending back down to 13.0 -Blood cultures negative today, continue to follow cultures Previous urine culture positive for Pseudomonas aeruginosa, pansensitive 4/421 -Change IV antibiotics to cefepime to cover Pseudomonas. -DC Rocephin -Continue IV fluid D5 1/2NS at 100 cc/hr x 2 days  -Procalcitonin level was not done but lactic acid level 0.7 and repeat was 0.6  Hypernatremia/Hyperchloremia  -Patient sodium is trended up  to 147 -Continue with fluids as above -Continue monitor and trend repeat CMP in a.m. if family so desires  History of metastatic prostate carcinoma: -Presented from home hospice -Hospice team will continue to follow and will address further goals of care discussion given that he is not improving.  Essential Hypertension:  -Hold oral antihypertensives after passing swallow evaluation  -Start IV Lopressor 5 mg daily  -Hydralazine as needed for blood pressure more than 160/100 -Continue to monitor vital signs per protocol  Depression/hyperlipidemia/dementia/coronary artery disease:  Hold oral medications due to n.p.o.  Severe protein calorie malnutrition -Low BMI -Muscle mass loss -Albumin 2.6 -N.p.o. due to encephalopathy, needs to pass swallow evaluation but he failed  Patient is at increased risk of morbidity and mortality given his advanced age, current condition with comorbidities, and lack of oral intake.  He has a very high risk for inpatient decompensation and likely I feel that residential hospice would be the best option for the patient given his continued encephalopathy and worsening renal function.  DVT prophylaxis: Heparin 5,000 units sq q8h. SCDS Code Status: DO NOT RESUSCITATE  Family Communication: Discussed with daughter at bedside  Disposition Plan: Likely Residential Hospice as patient is not improving and Renal Fxn is worsening  Status is: Inpatient  Remains inpatient appropriate because:Altered mental status, Unsafe d/c plan, IV treatments appropriate due to intensity of illness or inability to take PO and Inpatient level of care appropriate due to severity of illness   Dispo:  Patient From: Home  Planned Disposition: Residential Hospice  Expected discharge date: 03/25/20  Medically stable for discharge: No  Consultants:  [place] but was cancelled   Procedures:  None  Antimicrobials: Anti-infectives (From  admission, onward)   Start     Dose/Rate Route Frequency Ordered Stop   03/23/20 1800  ceFEPIme (MAXIPIME) 1 g in sodium chloride 0.9 % 100 mL IVPB     Discontinue     1 g 200 mL/hr over 30 Minutes Intravenous Every 24 hours 03/23/20 1646     03/23/20 1630  cefTRIAXone (ROCEPHIN) 1 g in sodium chloride 0.9 % 100 mL IVPB  Status:  Discontinued        1 g 200 mL/hr over 30 Minutes Intravenous Every 24 hours 03/22/20 1706 03/23/20 1603   03/22/20 1600  cefTRIAXone (ROCEPHIN) 1 g in sodium chloride 0.9 % 100 mL IVPB        1 g 200 mL/hr over 30 Minutes Intravenous  Once 03/22/20 1550 03/22/20 1741     Subjective: Seen and examined at bedside and he was doing okay and resting per the patient daughter.  She states that this is the first 1 she is seeing and rest comfortably.  No nausea or vomiting.  Renal function is worsening.  I spoke to the daughter and explained to them I feel that he may not improve.  Hospice is going to come back and talk with the family.  No other concerns or claims at this time.  Objective: Vitals:   03/23/20 1834 03/23/20 2109 03/24/20 0500 03/24/20 0600  BP: (!) 153/89 (!) 168/70  (!) 179/70  Pulse: 84 87  96  Resp: 20 16  14   Temp: 97.7 F (36.5 C) 98.6 F (37 C)  98.2 F (36.8 C)  TempSrc: Oral Axillary  Oral  SpO2: 97% 95%  96%  Weight:   53.3 kg     Intake/Output Summary (Last 24 hours) at 03/24/2020 1324 Last data filed at 03/24/2020 1230 Gross per 24 hour  Intake 429.66 ml  Output 1400 ml  Net -970.34 ml   Filed Weights   03/24/20 0500  Weight: 53.3 kg   Examination: Physical Exam:  Constitutional: Thin chronically ill-appearing Caucasian male who is resting and in no acute distress but is sleeping and difficult to arouse Eyes: Lids and conjunctivae normal, sclerae anicteric  ENMT: External Ears, Nose appear normal. Grossly normal hearing.  Poor dentition Neck: Appears normal, supple, no cervical masses, normal ROM, no appreciable thyromegaly;  no JVD Respiratory: Diminished to auscultation bilaterally, no wheezing, rales, rhonchi or crackles. Normal respiratory effort and patient is not tachypenic. No accessory muscle use.  Unlabored breathing Cardiovascular: RRR, no murmurs / rubs / gallops. S1 and S2 auscultated. No extremity edema.  Abdomen: Soft, non-tender, non-distended. No masses palpated. Bowel sounds positive.  GU: Deferred. Musculoskeletal: No clubbing / cyanosis of digits/nails. No joint deformity upper and lower extremities.  Skin: No rashes, lesions, ulcers on a limited skin evaluation. No induration; Warm and dry.  Neurologic: CN 2-12 grossly intact with no focal deficits. Romberg sign and cerebellar reflexes not assessed.  Psychiatric: Normal judgment and insight. Alert and oriented x 3. Normal mood and appropriate affect.   Data Reviewed: I have personally reviewed following labs and imaging studies  CBC: Recent Labs  Lab 03/22/20 1430 03/22/20 1900 03/23/20 0409 03/23/20 1817 03/24/20 0053  WBC 11.7* 13.1* 13.5* 13.6* 13.0*  NEUTROABS 10.2*  --   --   --  11.7*  HGB 7.1* 7.2* 6.9* 7.1* 6.8*  HCT 23.4* 24.0* 22.6* 23.4* 22.1*  MCV 94.4 97.2 96.2 95.5 93.6  PLT 120* 126* 124* 128* 378*   Basic Metabolic Panel: Recent  Labs  Lab 03/22/20 1430 03/22/20 1900 03/23/20 0409 03/23/20 1817 03/24/20 0053  NA 140  --  143 144 147*  K 5.1  --  5.2* 5.1 5.0  CL 106  --  111 115* 115*  CO2 18*  --  15* 15* 16*  GLUCOSE 103*  --  80 117* 114*  BUN 109*  --  105* 109* 109*  CREATININE 7.63* 7.19* 7.56* 7.68* 7.75*  CALCIUM 9.4  --  8.7* 8.9 9.1  MG  --  2.0  --   --   --   PHOS  --  5.4*  --   --   --    GFR: Estimated Creatinine Clearance: 5.7 mL/min (A) (by C-G formula based on SCr of 7.75 mg/dL (H)). Liver Function Tests: Recent Labs  Lab 03/23/20 0409  AST 17  ALT 9  ALKPHOS 55  BILITOT 0.5  PROT 5.9*  ALBUMIN 2.6*   No results for input(s): LIPASE, AMYLASE in the last 168 hours. Recent Labs   Lab 03/22/20 1900  AMMONIA 16   Coagulation Profile: No results for input(s): INR, PROTIME in the last 168 hours. Cardiac Enzymes: No results for input(s): CKTOTAL, CKMB, CKMBINDEX, TROPONINI in the last 168 hours. BNP (last 3 results) No results for input(s): PROBNP in the last 8760 hours. HbA1C: No results for input(s): HGBA1C in the last 72 hours. CBG: No results for input(s): GLUCAP in the last 168 hours. Lipid Profile: No results for input(s): CHOL, HDL, LDLCALC, TRIG, CHOLHDL, LDLDIRECT in the last 72 hours. Thyroid Function Tests: No results for input(s): TSH, T4TOTAL, FREET4, T3FREE, THYROIDAB in the last 72 hours. Anemia Panel: Recent Labs    03/22/20 1900  VITAMINB12 794  FOLATE 16.9   Sepsis Labs: Recent Labs  Lab 03/22/20 1430 03/22/20 1900  LATICACIDVEN 0.7 0.6    Recent Results (from the past 240 hour(s))  Blood culture (routine x 2)     Status: None (Preliminary result)   Collection Time: 03/22/20  2:15 PM   Specimen: BLOOD  Result Value Ref Range Status   Specimen Description BLOOD RIGHT ANTECUBITAL  Final   Special Requests   Final    BOTTLES DRAWN AEROBIC AND ANAEROBIC Blood Culture results may not be optimal due to an inadequate volume of blood received in culture bottles   Culture   Final    NO GROWTH 2 DAYS Performed at [facility], [place] [address] [place], [place] [address]    Report Status PENDING  Incomplete  Blood culture (routine x 2)     Status: None (Preliminary result)   Collection Time: 03/22/20  2:39 PM   Specimen: BLOOD  Result Value Ref Range Status   Specimen Description BLOOD LEFT ANTECUBITAL  Final   Special Requests   Final    BOTTLES DRAWN AEROBIC AND ANAEROBIC Blood Culture results may not be optimal due to an inadequate volume of blood received in culture bottles   Culture   Final    NO GROWTH 2 DAYS Performed at [facility], [place] [address], [place], [place] [address]    Report Status PENDING  Incomplete   SARS Coronavirus 2 by RT PCR (hospital order, performed in [facility] hospital lab) Nasopharyngeal Nasopharyngeal Swab     Status: None   Collection Time: 03/22/20  4:30 PM   Specimen: Nasopharyngeal Swab  Result Value Ref Range Status   SARS Coronavirus 2 NEGATIVE NEGATIVE Final    Comment: (NOTE) SARS-CoV-2 target nucleic acids  are NOT DETECTED.  The SARS-CoV-2 RNA is generally detectable in upper and lower respiratory specimens during the acute phase of infection. The lowest concentration of SARS-CoV-2 viral copies this assay can detect is 250 copies / mL. A negative result does not preclude SARS-CoV-2 infection and should not be used as the sole basis for treatment or other patient management decisions.  A negative result may occur with improper specimen collection / handling, submission of specimen other than nasopharyngeal swab, presence of viral mutation(s) within the areas targeted by this assay, and inadequate number of viral copies (<250 copies / mL). A negative result must be combined with clinical observations, patient history, and epidemiological information.  Fact Sheet for Patients:   StrictlyIdeas.no  Fact Sheet for Healthcare Providers: BankingDealers.co.za  This test is not yet approved or  cleared by the [place] FDA and has been authorized for detection and/or diagnosis of SARS-CoV-2 by FDA under an Emergency Use Authorization (EUA).  This EUA will remain in effect (meaning this test can be used) for the duration of the COVID-19 declaration under Section 564(b)(1) of the Act, 21 U.S.C. section 360bbb-3(b)(1), unless the authorization is terminated or revoked sooner.  Performed at [facility], [place] [address], [place],  [address]     RN Pressure Injury Documentation:     Estimated body mass index is 19.55 kg/m as calculated from the following:   Height as of 11/16/19: 5\' 5"  (1.651 m).    Weight as of this encounter: 53.3 kg.  Malnutrition Type:      Malnutrition Characteristics:      Nutrition Interventions:    Radiology Studies: DG Chest 1 View  Result Date: 03/22/2020 CLINICAL DATA:  Decreased mental status EXAM: CHEST  1 VIEW COMPARISON:  11/16/2019 FINDINGS: Mild cardiomegaly. Bibasilar opacities, likely atelectasis. No overt edema. No effusions or acute bony abnormality. Advanced degenerative changes in the shoulders. Aortic atherosclerosis. IMPRESSION: Cardiomegaly. Bibasilar opacities, likely atelectasis. Electronically Signed   By: Rolm Baptise M.D.   On: 03/22/2020 15:25   Scheduled Meds: . ferrous sulfate  325 mg Oral Q breakfast  . heparin  5,000 Units Subcutaneous Q8H  . metoprolol tartrate  5 mg Intravenous Daily   Continuous Infusions: . ceFEPime (MAXIPIME) IV 200 mL/hr at 03/23/20 1724  . dextrose 5 % and 0.45% NaCl 100 mL/hr at 03/23/20 1724    LOS: 2 days   Kerney Elbe, DO [facility] PAGER is on [place]  If 7PM-7AM, please contact night-coverage [email or web address]

## 2020-03-24 NOTE — Progress Notes (Signed)
OT Cancellation Note  Patient Details Name: MICKEY ESGUERRA MRN: [id number] DOB: Mar 22, 1941   Cancelled Treatment:    Reason Eval/Treat Not Completed: Other (comment) patient is followed by hospice with a terminal diagnosis of prostate cancer. OT to discontinue orders at this time.   Gloris Manchester OTR/L Supplemental OT, Department of rehab services [phone]  Daryel Kenneth R H. 03/24/2020, 2:23 PM

## 2020-03-24 NOTE — Evaluation (Signed)
Clinical/Bedside Swallow Evaluation Patient Details  Name: Victor Davis MRN: [id number] Date of Birth: 1941-05-29  Today's Date: 03/24/2020 Time: SLP Start Time (ACUTE ONLY): 0906 SLP Stop Time (ACUTE ONLY): 0916 SLP Time Calculation (min) (ACUTE ONLY): 10 min  Past Medical History:  Past Medical History:  Diagnosis Date  . Anemia, unspecified   . Cancer ([place])    lung and prostate cancer per wife  . Carotid artery disease ([place])   . CKD (chronic kidney disease), stage IV ([place])   . Coronary artery disease    a. DES to LAD and Cx 02/2012.  [name] Dementia ([place])   . Dysuria 10/10/2010  . Emphysema 04/01/2012   By CT chest, august 2013  . ERECTILE DYSFUNCTION 04/05/2007  . GERD (gastroesophageal reflux disease)   . GI bleeding   . HYPERLIPIDEMIA 07/30/2008  . HYPERSOMNIA 10/02/2007  . Impaired glucose tolerance 02/09/2011  . INSOMNIA-SLEEP DISORDER-UNSPEC 10/22/2009  . LOW BACK PAIN 04/05/2007  . OSTEOARTHRITIS 04/05/2007  . PEPTIC ULCER DISEASE 04/05/2007  . PERIPHERAL EDEMA 10/22/2009  . PROSTATE CANCER, HX OF 04/05/2007  . RESTLESS LEG SYNDROME 05/15/2007  . RHINITIS, ALLERGIC NOS 05/15/2007  . Sinus bradycardia 10/24/2010   a. prior h/o HR 40s, clonidine stopped 05/2017 due to this.  . Stroke (cerebrum) ([place])   . Unspecified visual loss 07/15/2008  . UTI 10/24/2010   Past Surgical History:  Past Surgical History:  Procedure Laterality Date  . CARDIAC CATHETERIZATION    . COLONOSCOPY N/A 11/28/2017   Procedure: COLONOSCOPY;  Surgeon: Yetta Flock, MD;  Location: [facility] ENDOSCOPY;  Service: Gastroenterology;  Laterality: N/A;  . ESOPHAGOGASTRODUODENOSCOPY N/A 11/28/2017   Procedure: ESOPHAGOGASTRODUODENOSCOPY (EGD);  Surgeon: Yetta Flock, MD;  Location: [facility];  Service: Gastroenterology;  Laterality: N/A;  . EYE SURGERY     right  . Inguinal herniorrhapy left  2002   x 2  . Left hip replacement    . LYMPH NODE DISSECTION Right 03/22/2015   Procedure: LYMPH NODE  DISSECTION;  Surgeon: Melrose Nakayama, MD;  Location: [facility];  Service: Thoracic;  Laterality: Right;  . PERCUTANEOUS CORONARY STENT INTERVENTION (PCI-S) N/A 03/08/2012   Procedure: PERCUTANEOUS CORONARY STENT INTERVENTION (PCI-S);  Surgeon: Burnell Blanks, MD;  Location[facility] LAB;  Service: Cardiovascular;  Laterality: N/A;  . PROSTATECTOMY  2002   radical  . Right hip replacement  09/22/09  . ROTATOR CUFF REPAIR     left  . s/p ventral surgery  2009  . SEGMENTECOMY Right 03/22/2015   Procedure: RIGHT LOWER LOBE SUPERIOR SEGMENTECTOMY;  Surgeon: Melrose Nakayama, MD;  Location: [facility];  Service: Thoracic;  Laterality: Right;  [name] VIDEO ASSISTED THORACOSCOPY Right 03/22/2015   Procedure: RIGHT VIDEO ASSISTED THORACOSCOPY;  Surgeon: Melrose Nakayama, MD;  Location: [facility];  Service: Thoracic;  Laterality: Right;   HPI:  Pt is an 79 yo male presenting with generalized weakness and AMS. Per MD note, encephalopathy likely multifactorial in the setting of dehydration/worsening kidney function/uremia/UTI. PMH includes: metastatic prostate ca, anemia, chronic thrombocytopenia, CKD, HTN, HLD, CAD, CVA, dementia, GERD   Assessment / Plan / Recommendation Clinical Impression  Pt's mentation and generalized weakness impact safety with swallowing at this time. He shows minimal attempts at bolus acceptance, but when he does try to suck on a straw, he cannot generate enough strength for it to reach his lips. As SLP assists with feeding, he still has no attempts at bolus preparation, posterior transit, or swallow initiation. Delayed coughing is concerning  for ultimate posterior spillage into the pharynx/larynx. He does not cough or swallow to command despite cues. If wanting to offer any POs for comfort, would consider small amounts of thin liquids via swab. Otherwise, would focus more heavily on oral hygiene. SLP will f/u briefly for education and diagnostic PO trials as family tries to treat the  treatable before ultimately making their decision regarding discharge plan.   SLP Visit Diagnosis: Dysphagia, unspecified (R13.10)    Aspiration Risk  Severe aspiration risk;Risk for inadequate nutrition/hydration    Diet Recommendation NPO   Medication Administration: Via alternative means    Other  Recommendations Oral Care Recommendations: Oral care QID Other Recommendations: Have oral suction available   Follow up Recommendations 24 hour supervision/assistance      Frequency and Duration min 2x/week  1 week       Prognosis Prognosis for Safe Diet Advancement: Guarded Barriers to Reach Goals: Cognitive deficits      Swallow Study   General HPI: Pt is an 79 yo male presenting with generalized weakness and AMS. Per MD note, encephalopathy likely multifactorial in the setting of dehydration/worsening kidney function/uremia/UTI. PMH includes: metastatic prostate ca, anemia, chronic thrombocytopenia, CKD, HTN, HLD, CAD, CVA, dementia, GERD Type of Study: Bedside Swallow Evaluation Previous Swallow Assessment: none in chart Diet Prior to this Study: NPO Temperature Spikes Noted: No Respiratory Status: Room air History of Recent Intubation: No Behavior/Cognition: Lethargic/Drowsy;Requires cueing Oral Cavity Assessment: Dry;Dried secretions Oral Care Completed by SLP: Yes Oral Cavity - Dentition: Adequate natural dentition;Poor condition Self-Feeding Abilities: Total assist Patient Positioning: Upright in bed Baseline Vocal Quality: Normal Volitional Cough: Cognitively unable to elicit Volitional Swallow: Unable to elicit    Oral/Motor/Sensory Function Overall Oral Motor/Sensory Function:  (UTA)   Ice Chips Ice chips: Impaired Presentation: Spoon Oral Phase Impairments: Poor awareness of bolus;Reduced labial seal;Reduced lingual movement/coordination Oral Phase Functional Implications: Oral holding Pharyngeal Phase Impairments: Unable to trigger swallow;Cough - Delayed    Thin Liquid Thin Liquid: Impaired Presentation: Straw (swab) Oral Phase Impairments: Reduced labial seal;Reduced lingual movement/coordination;Poor awareness of bolus Oral Phase Functional Implications: Oral holding Pharyngeal  Phase Impairments: Unable to trigger swallow;Cough - Delayed    Nectar Thick Nectar Thick Liquid: Not tested   Honey Thick Honey Thick Liquid: Not tested   Puree Puree: Not tested   Solid     Solid: Not tested      Osie Bond., M.A. [place] Pager [phone] Office [phone]  03/24/2020,9:24 AM

## 2020-03-24 NOTE — Progress Notes (Signed)
Palliative Medicine RN Note: Consult order noted for goals of care. Mr Victor Davis is currently admitted as a [facility]IP hospice patient with [facility]).  I discussed Mr Victor Davis with Venia Carbon, [facility] liaison who is addressing [place] with the family and has a relationship with them; please see her note from yesterday at 23. She is going to see the family again today and does not feel that PMT involvement would be helpful at this time.   Our team will be available if the situation changes or if there are questions.  Marjie Skiff Devario Bucklew, RN, BSN, [facility] Palliative Medicine Team 03/24/2020 8:55 AM Office [phone]

## 2020-03-24 NOTE — Plan of Care (Signed)
  Problem: Education: Goal: Knowledge of General Education information will improve Description: Including pain rating scale, medication(s)/side effects and non-pharmacologic comfort measures Outcome: Not Progressing   

## 2020-03-24 NOTE — Consult Note (Signed)
   [facility] Inpatient Consult   03/24/2020  Victor Davis Dec 18, 1940 [id number]   [facility] [ACO] Patient:  Victor Davis   Patient screened for high risk score for unplanned readmission score and for hospitalizations to check if potential [place] Management service needs.  Review of patient's medical record reveals patient is being followed by hospice and palliative care.  Primary Care Provider is Cathlean Cower, MD [place] this provider is listed to provide the transition of care [TOC] for post hospital follow up.   Plan:  Will continue to follow progress, notes for transition  and disposition needs, as appropriate.  Please place a [facility] Care Management consult as appropriate and for questions contact:   Natividad Brood, RN BSN [facility] Liaison  [phone] business mobile phone Toll free office [phone]  Fax number: [phone] [email or web address]

## 2020-03-24 NOTE — Progress Notes (Addendum)
Paged on call hospitalist Dr.Denny to inform of critical lab value Hgb 6.8. Awaiting MD response. 03/24/2020 @ 0222 Cyndi Bender, RN  Dr. Sharlet Salina responded and is going to monitor patient at this time. His Hgb was 6.9 previously and untreated. He is currently in no distress. If that changes in any way Dr. Sharlet Salina is to be informed and will reassess situation. 03/24/2020 @ [place]

## 2020-03-25 DIAGNOSIS — Z515 Encounter for palliative care: Secondary | ICD-10-CM

## 2020-03-25 DIAGNOSIS — G893 Neoplasm related pain (acute) (chronic): Secondary | ICD-10-CM

## 2020-03-25 DIAGNOSIS — C61 Malignant neoplasm of prostate: Secondary | ICD-10-CM

## 2020-03-25 DIAGNOSIS — C7951 Secondary malignant neoplasm of bone: Secondary | ICD-10-CM

## 2020-03-25 LAB — URINE CULTURE: Culture: <10000 — AB

## 2020-03-25 MED ORDER — MORPHINE SULFATE (CONCENTRATE) 10 MG/0.5ML PO SOLN
5.0000 mg | ORAL | Status: DC | PRN
  Administered 2020-03-25 (×2): 5 mg via ORAL
  Filled 2020-03-25 (×2): qty 0.5

## 2020-03-25 NOTE — Progress Notes (Signed)
Manufacturing engineer [facility]) Hospital Liaison note.    A bed at [facility] was offer to pt's spouse who declined.  This RN made Mrs. Craddock aware that bed availability is unpredictable and cannot be guaranteed for tomorrow.  Mrs. Glatfelter verbalized understanding and preference for pt to stay at [facility] at this time.  TOC made aware.  Hospital Liaison will follow up tomorrow if a room becomes available.  Please do not hesitate to call with questions.    Thank you for the opportunity to participate in this patient's care.  Domenic Moras, BSN, RN [facility] Liaison (listed on AMION under Hospice/[facility])  [phone]   [phone]  (24h on call)

## 2020-03-25 NOTE — Progress Notes (Signed)
[place] [address] [facility]) Hospitalized Hospice Patient  Victor Davis is a current hospice patient with a terminal diagnosis of prostate cancer. His wife reports that on 8/9 he began acting more confused and she felt he was dehydrated. She elected to contact EMS so the patient could be transported to the hospital for further evaluation. He is admitted with acute metabolic encephalopathy. This is a related hospital admission per Dr. Karie Georges with [facility].  Met with patient and wife, Victor Davis at bedside along with Wadie Lessen, NP to discuss [place]. Victor Davis was offered a [facility] bed this morning but declined. She had concerns over how he would be cared for at [facility] and if her children could visit. We reviewed [facility] visitation policy. We discussed pain management with Roxanol which she was agreeable to. She insists on keep fluids going at [facility]. No further diagnostics or IV sticks.  Hospital liaison will follow up 8/13 on availability of beds at [facility].  Currently patient is minimally responsive and having periods of apnea.  VS: 98.9, 178/91, 72, 20, 100% RA I/O: 0/600 Abnormal Labs: Na 147, Chloride 115, CO2 16, BUN 109, Creatinine 7.75, anion gap 16, GFR 6, WBC 13.0, RBC 2.36, Hgb 6.8, HCT 22.1 NEUT 11.7, Abs Imm Gran 0.11 IVs/PRNs: D51/2 NS @ 33m/hr, Morphine 536mPO Q2hrs PRN @ 1545  Problem List: -AKI on CKD - kidney function worsening, nephrotoxic drugs held, fluid resuscitation did not reverse -Anemia - hgb 6.8, no plans to transfuse -Prostate cancer - pt is actively dying  GOWashtaWife has agreed to comfort care only [facility].  D/C planning:  [facility], if he does not have a hospital death while waiting Family: Spoke with wife at bedside IDT: team updated  Please use [facility] for ambulance transport as they contract with [place] our active hospice patients.  Please call with any hospice related questions or  concerns.  Thank you, MaFarrel GordonRN, CCM  [facility] listed on AM[facility] [phone]

## 2020-03-25 NOTE — Progress Notes (Signed)
PROGRESS NOTE    Victor Davis  KYH:[id number] DOB: [birth year] DOA: 03/22/2020 PCP: Biagio Borg, MD  Brief Narrative:  HPI per Dr. Soyla Murphy on 03/22/20 Victor Davis is a 79 y.o. male with medical history significant of metastatic prostate cancer, anemia of chronic disease, chronic thrombocytopenia, CKD stage IIIb, hypertension, hyperlipidemia, coronary artery disease status post stents to LAD,Lcx in 02/2012, carotid artery stenosis, CVA, dementia brought by EMS to the emergency department with generalized weakness and confusion.  Patient is currently on home hospice.  Wife reports that patient has worsening mental status since yesterday, has been lethargic, decreased appetite, has not been eating or drinking since past 2 days, unable to walk due to generalized weakness, yesterday evening he became less responsive, difficult to arouse and his symptoms worsened this morning therefore she called 911 and brought patient to the emergency department for further evaluation and management for possible IV fluids and IV antibiotics but does not want any heroic measures.  ED physician discussed with hosp[facility] with no beds available at this time.  No history of head trauma, seizures, loss of consciousness, fever, chills, cough, congestion, nausea, vomiting, diarrhea.  ED Course: Upon arrival to ED: Patient tachypneic in 20s, blood pressure 157/91, afebrile with leukocytosis of 11.7, CBC shows anemia of chronic disease, platelet: 20, CMP shows worsening kidney function, potassium: WNL, anion gap: 16.  Lactic acid: WNL.  Chest x-ray shows cardiomegaly, bibasilar opacity likely atelectasis.  Blood culture, UA, COVID-19 pending.  Patient received IV fluid bolus and Rocephin in the ED.  [facility] hospitalist consulted for admission.  **Interim History  Patient continued to be minimally interactive and somnolent with times of also being apneic.  He does not really arouse today and SLP  evaluated and recommending strict n.p.o and repeat testing was canceled today.  Patient is a hospice patient and patient's family still would like to treat the treatable however his encephalopathy has not improved with antibiotics and his renal function is worsening yesterday.  He has a terminal diagnosis of prostate cancer and he has been more confused and wife felt that he was dehydrated.  He is not candidate for hemodialysis and antibiotics have been broadened.  I do not feel the patient can return home safely and feel the best option would be for residential hospice placement.  Today the hospice liaison offered them a bed at residential hospice however the patient's wife declined this.  Subsequently palliative care came by to address goals of care and the plan is to transition to full comfort measures and minimizing fluids to [facility] with no further diagnostic tests, IV sticks or IV medications.  Palliative care team has started patient on sublingual Roxanol.  Patient's prognosis is extremely poor and I anticipate either an in-hospital death if the patient is not transferred to residential hospice as he cannot safely be discharged home.  Assessment & Plan:   Principal Problem:   Acute metabolic encephalopathy Active Problems:   Hyperlipidemia   PROSTATE CANCER, HX OF   Hypertension   Acute on chronic kidney failure (HCC)   Dementia (HCC)   Depression   Thrombocytopenia (HCC)   Anemia due to chronic kidney disease   Generalized weakness   Pain, cancer   Prostate cancer metastatic to bone [facility])   Palliative care by specialist  Acute metabolic encephalopathy multifactorial: -Minimally interactive, family wants to treat the treatable -Encephalopathy likely multifactorial in the setting of dehydration/worsening kidney function/uremia/UTI but despite fluid resuscitation and treatment IV antibiotic  patient's AMS is still not improving -Per documentation, patient is a hospice patient.  Per [employer], patient's spouse -would like to treat the treatable but I feel that this may be a terminal event given that he is not improving. -Patient sodium is elevated at 147 -Continue IV fluid and IV antibiotics empirically. -Speech therapist consulted to evaluate safety in swallowing and recommending strict n.p.o. -Aspiration precautions/fall precautions -Continue to monitor and trend and watch the patient closely -His ammonia level was 16 -We will have hospice readdress goals of care as I feel the patient clinically will not improve; hospice evaluated and they offered the patient's wife in bed at [facility] however she subsequently refused this; palliative care was further consulted and they had a very lengthy discussion with the patient's family and after the goals of care discussion it is clear Victor Davis verbalized to the palliative care team the main focus of the patient's care would be for comfort.  Subsequently all medications not in the patient's comfort were discontinued and IV fluids were minimized to [facility].  Patient will be transition to full comfort measures and sling will Roxanol was started.  We will obtain no further diagnostic tests, IV sticks or IV medications with patient currently being transitioned for comfort.  -Given the patient's advanced age, poor prognosis with his prostate cancer and unresponsiveness he is at very high risk for further decompensation and likely in-hospital death and this would not be unexpected now that he is being transitioned to full comfort measures and that his antibiotics are being stopped.  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  -Patient is a hospice patient, has dementia and metastatic prostate carcinoma-he is a poor candidate  for dialysis.  -Patient's CO2 was 16, chloride level 115, anion gap was 16 -Will hold off nephrology consult for now. -Monitor urine output -Avoid nephrotoxic medications, contrast dyes, hypotension and renally adjust medications -We will not check further labs given the patient being transitioned to comfort care; patient has had periods of apnea and he is essentially unresponsive now and imminent hospital death is pending if he is not transferred to residential hospice  Iron deficiency anemia/anemia of chronic disease -Hg drop 6.9 from 7.2; now patient's hemoglobin/hematocrit is 6.8/22.1 -PTA po iron supplement on hold due to NPO -We will need to discuss with the family about blood transfusion -Currently no signs and symptoms of bleeding -We will not check further labs given being transitioned to comfort care  Hyperkalemia likely secondary to acute renal failure -Presented with serum potassium 5.2 -Continue IV fluid and repeat potassium was 5.0 -Continue to monitor and trend and will not repeat CMP  Sepsis secondary presumed UTI, history of pansensitive Pseudomonas aeruginosa UTI 11/16/19 -Presented with leukocytosis, WBC 13.1K,  Respiration rate 24, and pyuria and likely source of infection UTI -UA positive for pyuria and showed a cloudy appearance with moderate hemoglobin, moderate leukocytes, 100 protein, many bacteria, 21-50 RBCs per high-power field, and greater than 50 WBCs -Urine culture ordered, still pending however antibiotics have been discontinued -WBC went from 11.7 trended up to 13.6 and is now trending back down to 13.0 -Blood cultures negative today, continue to follow cultures Previous urine culture positive for Pseudomonas aeruginosa, pansensitive 4/421 -Change IV antibiotics to cefepime to cover Pseudomonas. -DC Rocephin -Continue  IV fluid D5 1/2NS at 100 cc/hr x 2 days and this is now [facility] -Procalcitonin level was not done but lactic acid level 0.7 and repeat was  0.6  Hypernatremia/Hyperchloremia  -Patient sodium is trended up to 147 -Continued with fluids as above -We will not continue to monitor and trend CMP as patient is being transitioned to comfort care  History of metastatic prostate carcinoma: -Presented from home hospice -Hospice team will continue to follow and will address further goals of care discussion given that he is not improving. -Palliative care got involved and now patient is transition to full comfort care  Essential Hypertension:  -Hold oral antihypertensives after passing swallow evaluation  -Start IV Lopressor 5 mg daily  however this was now stopped given the patient is being transitioned to comfort care -Hydralazine as needed for blood pressure more than 160/100 -Continue to monitor vital signs per protocol  Depression/hyperlipidemia/dementia/coronary artery disease:  Continue to hold oral medications due to n.p.o.; clear liquid diet has been initiated for comfort  Severe protein calorie malnutrition -Low BMI -Muscle mass loss -Albumin 2.6 -N.p.o. due to encephalopathy, needs to pass swallow evaluation but he failed  Patient is at increased risk of morbidity and mortality given his advanced age, current condition with comorbidities, and lack of oral intake.  He has a very high risk for inpatient decompensation and likely I feel that residential hospice would be the best option for the patient given his continued encephalopathy and worsening renal function.  Residential hospice bed was offered to the patient's family today however the patient is wife declined.  Patient has had periods of apnea I feel that because she has declined this patient has imminent hospital death pending given his poor prognosis and worsening status with periods of apnea.  DVT prophylaxis: Heparin 5,000 units sq q8h. SCDS Code Status: DO NOT RESUSCITATE  Family Communication: Discussed with daughter at bedside  Disposition Plan: Residential  Hospice as patient is not improving and Renal Fxn is worsening; patient was offered a bed at [facility] today however patient's wife declined.  Likely in-hospital death now given his poor prognosis and further risk of decompensation given his advanced age, comorbidities and current clinical status.  Status is: Inpatient  Remains inpatient appropriate because:Altered mental status, Unsafe d/c plan, IV treatments appropriate due to intensity of illness or inability to take PO and Inpatient level of care appropriate due to severity of illness   Dispo:  Patient From: Home  Planned Disposition: Residential Hospice vs In hospital Death   Expected discharge date: 03/25/20  Medically stable for discharge: No  Consultants:  Hospice Palliative Care Medicine   Procedures:  None   Antimicrobials: Anti-infectives (From admission, onward)   Start     Dose/Rate Route Frequency Ordered Stop   03/23/20 1800  ceFEPIme (MAXIPIME) 1 g in sodium chloride 0.9 % 100 mL IVPB  Status:  Discontinued        1 g 200 mL/hr over 30 Minutes Intravenous Every 24 hours 03/23/20 1646 03/25/20 1341   03/23/20 1630  cefTRIAXone (ROCEPHIN) 1 g in sodium chloride 0.9 % 100 mL IVPB  Status:  Discontinued        1 g 200 mL/hr over 30 Minutes Intravenous Every 24 hours 03/22/20 1706 03/23/20 1603   03/22/20 1600  cefTRIAXone (ROCEPHIN) 1 g in sodium chloride 0.9 % 100 mL IVPB        1 g 200 mL/hr over 30 Minutes Intravenous  Once 03/22/20 1550 03/22/20 1741  Subjective: Seen and examined at bedside and he was essentially unresponsive with periods of apnea.  Patient would not arouse with physical verbal stimuli and had shallow short breathing.  He appeared comfortable now.  No family was at bedside when I went to go see him.  No other concerns or complaints at this time.  Objective: Vitals:   03/24/20 1345 03/24/20 2006 03/25/20 0445 03/25/20 1304  BP: (!) 182/75 (!) 168/79 (!) 174/71 (!) 178/91  Pulse: 81 96  91 72  Resp: 16 17 20 20   Temp: 97.9 F (36.6 C) 97.9 F (36.6 C) 99.1 F (37.3 C) 98.9 F (37.2 C)  TempSrc: Oral Oral Oral Axillary  SpO2: 97% 97% 98% 100%  Weight:   54.6 kg     Intake/Output Summary (Last 24 hours) at 03/25/2020 1450 Last data filed at 03/24/2020 1820 Gross per 24 hour  Intake --  Output 200 ml  Net -200 ml   Filed Weights   03/24/20 0500 03/25/20 0445  Weight: 53.3 kg 54.6 kg   Examination: Physical Exam:  Constitutional: Thin chronically ill-appearing Caucasian male who is having periods of apnea and appears calm and in no acute distress.  He is difficult to arouse and he is not essentially responsive. Eyes: Lids and conjunctivae normal, sclerae anicteric  ENMT: External Ears, Nose appear normal. Grossly normal hearing. Poor Dentition Neck: Appears normal, supple, no cervical masses, normal ROM, no appreciable thyromegaly; no JVD Respiratory: Diminished to auscultation bilaterally with coarse breath sounds and short shallow breathing., no wheezing, rales, rhonchi or crackles.  Cardiovascular: RRR, no murmurs / rubs / gallops. S1 and S2 auscultated. No extremity edema.  Abdomen: Soft, non-tender, non-distended. Bowel sounds positive.  GU: Deferred. Musculoskeletal: No clubbing / cyanosis of digits/nails. No joint deformity upper and lower extremities. Skin: No rashes, lesions, ulcers on limited skin evaluation. No induration; Warm and dry.  Neurologic: CN 2-12 grossly intact with no focal deficits. Romberg sign and cerebellar reflexes not assessed.  Psychiatric: Impaired judgment and insight. He is not Alert and oriented x 3. Normal mood and appropriate affect.   Data Reviewed: I have personally reviewed following labs and imaging studies  CBC: Recent Labs  Lab 03/22/20 1430 03/22/20 1900 03/23/20 0409 03/23/20 1817 03/24/20 0053  WBC 11.7* 13.1* 13.5* 13.6* 13.0*  NEUTROABS 10.2*  --   --   --  11.7*  HGB 7.1* 7.2* 6.9* 7.1* 6.8*  HCT 23.4*  24.0* 22.6* 23.4* 22.1*  MCV 94.4 97.2 96.2 95.5 93.6  PLT 120* 126* 124* 128* 979*   Basic Metabolic Panel: Recent Labs  Lab 03/22/20 1430 03/22/20 1900 03/23/20 0409 03/23/20 1817 03/24/20 0053  NA 140  --  143 144 147*  K 5.1  --  5.2* 5.1 5.0  CL 106  --  111 115* 115*  CO2 18*  --  15* 15* 16*  GLUCOSE 103*  --  80 117* 114*  BUN 109*  --  105* 109* 109*  CREATININE 7.63* 7.19* 7.56* 7.68* 7.75*  CALCIUM 9.4  --  8.7* 8.9 9.1  MG  --  2.0  --   --   --   PHOS  --  5.4*  --   --   --    GFR: Estimated Creatinine Clearance: 5.9 mL/min (A) (by C-G formula based on SCr of 7.75 mg/dL (H)). Liver Function Tests: Recent Labs  Lab 03/23/20 0409  AST 17  ALT 9  ALKPHOS 55  BILITOT 0.5  PROT 5.9*  ALBUMIN  2.6*   No results for input(s): LIPASE, AMYLASE in the last 168 hours. Recent Labs  Lab 03/22/20 1900  AMMONIA 16   Coagulation Profile: No results for input(s): INR, PROTIME in the last 168 hours. Cardiac Enzymes: No results for input(s): CKTOTAL, CKMB, CKMBINDEX, TROPONINI in the last 168 hours. BNP (last 3 results) No results for input(s): PROBNP in the last 8760 hours. HbA1C: No results for input(s): HGBA1C in the last 72 hours. CBG: No results for input(s): GLUCAP in the last 168 hours. Lipid Profile: No results for input(s): CHOL, HDL, LDLCALC, TRIG, CHOLHDL, LDLDIRECT in the last 72 hours. Thyroid Function Tests: No results for input(s): TSH, T4TOTAL, FREET4, T3FREE, THYROIDAB in the last 72 hours. Anemia Panel: Recent Labs    03/22/20 1900  VITAMINB12 794  FOLATE 16.9   Sepsis Labs: Recent Labs  Lab 03/22/20 1430 03/22/20 1900  LATICACIDVEN 0.7 0.6    Recent Results (from the past 240 hour(s))  Blood culture (routine x 2)     Status: None (Preliminary result)   Collection Time: 03/22/20  2:15 PM   Specimen: BLOOD  Result Value Ref Range Status   Specimen Description BLOOD RIGHT ANTECUBITAL  Final   Special Requests   Final    BOTTLES  DRAWN AEROBIC AND ANAEROBIC Blood Culture results may not be optimal due to an inadequate volume of blood received in culture bottles   Culture   Final    NO GROWTH 3 DAYS Performed at [facility], [place] [address], [place], [place] [address]    Report Status PENDING  Incomplete  Blood culture (routine x 2)     Status: None (Preliminary result)   Collection Time: 03/22/20  2:39 PM   Specimen: BLOOD  Result Value Ref Range Status   Specimen Description BLOOD LEFT ANTECUBITAL  Final   Special Requests   Final    BOTTLES DRAWN AEROBIC AND ANAEROBIC Blood Culture results may not be optimal due to an inadequate volume of blood received in culture bottles   Culture   Final    NO GROWTH 3 DAYS Performed at [facility], [place] [address], [place], [place] [address]    Report Status PENDING  Incomplete  SARS Coronavirus 2 by RT PCR (hospital order, performed in [facility] hospital lab) Nasopharyngeal Nasopharyngeal Swab     Status: None   Collection Time: 03/22/20  4:30 PM   Specimen: Nasopharyngeal Swab  Result Value Ref Range Status   SARS Coronavirus 2 NEGATIVE NEGATIVE Final    Comment: (NOTE) SARS-CoV-2 target nucleic acids are NOT DETECTED.  The SARS-CoV-2 RNA is generally detectable in upper and lower respiratory specimens during the acute phase of infection. The lowest concentration of SARS-CoV-2 viral copies this assay can detect is 250 copies / mL. A negative result does not preclude SARS-CoV-2 infection and should not be used as the sole basis for treatment or other patient management decisions.  A negative result may occur with improper specimen collection / handling, submission of specimen other than nasopharyngeal swab, presence of viral mutation(s) within the areas targeted by this assay, and inadequate number of viral copies (<250 copies / mL). A negative result must be combined with clinical observations, patient history, and epidemiological information.  Fact  Sheet for Patients:   StrictlyIdeas.no  Fact Sheet for Healthcare Providers: BankingDealers.co.za  This test is not yet approved or  cleared by the [place] FDA and has been authorized for detection and/or diagnosis of SARS-CoV-2 by FDA  under an Emergency Use Authorization (EUA).  This EUA will remain in effect (meaning this test can be used) for the duration of the COVID-19 declaration under Section 564(b)(1) of the Act, 21 U.S.C. section 360bbb-3(b)(1), unless the authorization is terminated or revoked sooner.  Performed at [facility], [place] [address], [place], [place] [address]     RN Pressure Injury Documentation:     Estimated body mass index is 20.03 kg/m as calculated from the following:   Height as of 11/16/19: 5\' 5"  (1.651 m).   Weight as of this encounter: 54.6 kg.  Malnutrition Type:      Malnutrition Characteristics:      Nutrition Interventions:    Radiology Studies: No results found. Scheduled Meds:  Continuous Infusions:  dextrose 5 % and 0.45% NaCl 10 mL/hr at 03/25/20 1420    LOS: 3 days   Kerney Elbe, DO [facility] PAGER is on AMION  If 7PM-7AM, please contact night-coverage [email or web address]

## 2020-03-25 NOTE — Progress Notes (Addendum)
SLP Cancellation Note  Patient Details Name: Victor Davis MRN: [id number] DOB: Sep 11, 1940   Cancelled treatment:  Pt transitioning to comfort care. Pt is currently minimally responsive and having periods of apnea per chart review. ST will sign off at this time. Please reconsult if needs arise.  Victor Davis B. Quentin Ore, [name], [place] Speech Language Pathologist Office: [phone]  Shonna Chock 03/25/2020, 2:00 PM

## 2020-03-25 NOTE — Social Work (Signed)
CSW aware pt wife declining bed at [facility] at this time. Pt is Manufacturing engineer patient. We will follow for support as needed.  Westley Hummer, MSW, [place] [facility]

## 2020-03-25 NOTE — Progress Notes (Signed)
Nutrition Brief Note  Chart reviewed. Pt now transitioning to comfort care.  No further nutrition interventions warranted at this time.  Please re-consult as needed.   Abimelec Grochowski W, RD, LDN, CDCES Registered Dietitian II Certified Diabetes Care and Education Specialist Please refer to [facility] for RD and/or RD on-call/weekend/after hours pager  

## 2020-03-25 NOTE — Consult Note (Signed)
Consultation Note Date: 03/25/2020   Patient Name: Victor Davis  DOB: 03-22-1941  MRN: [id number]  Age / Sex: 79 y.o., male  PCP: Biagio Borg, MD Referring Physician: Kerney Elbe, DO  Reason for Consultation: Establishing goals of care and Psychosocial/spiritual support  HPI/Patient Profile: 79 y.o. male  admitted on 03/22/2020 with past medical history significant for metastatic prostate cancer, anemia of chronic disease, chronic thrombocytopenia, CKD/creatinine yesterday was 7.75, hypertension, hyperlipidemia, coronary artery disease status post stents and 7 of 2013, carotid artery stenosis, CVA, and dementia admitted through the emergency room with generalized weakness and confusion.  Patient lives at home with his wife, she is his main and only caregiver.  He currently is on hospice benefit with [facility].  Wife was approached today regarding a transfer to hosp[facility] facility[facility] which she turned down.   It is a difficult time for her realizing  that he is transitioning at end of life.  Currently patient is minimally responsive and having periods of apnea.  Palliative medicine team consulted to assist with clarification of goals of care and for emotional support.   Clinical Assessment and Goals of Care:  This NP Wadie Lessen reviewed medical records, received report from team, assessed the patient and then meet at the patient's bedside along with his wife to discuss diagnosis, prognosis, GOC, EOL wishes disposition and options.   Concept of Palliative Care was introduced as specialized medical care for people and their families living with serious illness.  If focuses on providing relief from the symptoms and stress of a serious illness.  The goal is to improve quality of life for both the patient and the family.  Created space and opportunity for wife to explore thoughts and  feelings regarding her husband's current medical situation.  She verbalizes a clear understanding that she knows he is at end-of-life.  She shares that she has been a caregiver for many people over the years and care for her sister at end of life.  She is able to clearly verbalize that  the main focus of care for her husband is comfort.     A  discussion was had today regarding advanced directives.  Concepts specific to code status, artifical feeding and hydration, continued IV antibiotics and rehospitalization was had.  The difference between a aggressive medical intervention path  and a palliative comfort care path for this patient at this time was had.  Values and goals of care important to patient and family were attempted to be elicited.   Natural trajectory and expectations at EOL were discussed.  Questions and concerns addressed.  Patient  encouraged to call with questions or concerns.     PMT will continue to support holistically.   Patient's wife is HPOA and main support person    SUMMARY OF RECOMMENDATIONS    -DNR/DNI -Minimize IV fluids to [facility], wife feels strongly about keeping at least this low KVO rate maintained -No further diagnostics, IV sticks or IV medications -Sublingual Roxanol   Code Status/Advance  Care Planning:  DNR  Symptom Management:   - Pain/Dyspnea: Roxanol 5 mg po/sl every 32 hrs prn    Palliative Prophylaxis:   Aspiration, Frequent Pain Assessment and Oral Care  Additional Recommendations (Limitations, Scope, Preferences):  Minimize Medications, No Blood Transfusions, No Diagnostics, No Glucose Monitoring, No Hemodialysis, No IV Antibiotics and No Lab Draws  Psycho-social/Spiritual:   Desire for further Chaplaincy support  -Declined hospital chaplain   -strong community spiritual support  Additional Recommendations: Education on Hospice  Prognosis:   Hours - Days  Discharge Planning: Anticipated Hospital Death      Primary  Diagnoses: Present on Admission: . Hyperlipidemia . Hypertension . Acute on chronic kidney failure ([place]) . Depression . Thrombocytopenia ([place]) . Dementia ([place])   I have reviewed the medical record, interviewed the patient and family, and examined the patient. The following aspects are pertinent.  Past Medical History:  Diagnosis Date  . Anemia, unspecified   . Cancer ([place])    lung and prostate cancer per wife  . Carotid artery disease ([place])   . CKD (chronic kidney disease), stage IV ([place])   . Coronary artery disease    a. DES to LAD and Cx 02/2012.  [name] Dementia ([place])   . Dysuria 10/10/2010  . Emphysema 04/01/2012   By CT chest, august 2013  . ERECTILE DYSFUNCTION 04/05/2007  . GERD (gastroesophageal reflux disease)   . GI bleeding   . HYPERLIPIDEMIA 07/30/2008  . HYPERSOMNIA 10/02/2007  . Impaired glucose tolerance 02/09/2011  . INSOMNIA-SLEEP DISORDER-UNSPEC 10/22/2009  . LOW BACK PAIN 04/05/2007  . OSTEOARTHRITIS 04/05/2007  . PEPTIC ULCER DISEASE 04/05/2007  . PERIPHERAL EDEMA 10/22/2009  . PROSTATE CANCER, HX OF 04/05/2007  . RESTLESS LEG SYNDROME 05/15/2007  . RHINITIS, ALLERGIC NOS 05/15/2007  . Sinus bradycardia 10/24/2010   a. prior h/o HR 40s, clonidine stopped 05/2017 due to this.  . Stroke (cerebrum) ([place])   . Unspecified visual loss 07/15/2008  . UTI 10/24/2010   Social History   Socioeconomic History  . Marital status: Married    Spouse name: Not on file  . Number of children: 2  . Years of education: Not on file  . Highest education level: Not on file  Occupational History  . Occupation: truck driver-retired  Tobacco Use  . Smoking status: Former Smoker    Packs/day: 1.00    Years: 15.00    Pack years: 15.00    Types: Cigarettes    Quit date: 02/05/1977    Years since quitting: 43.1  . Smokeless tobacco: Never Used  Vaping Use  . Vaping Use: Never used  Substance and Sexual Activity  . Alcohol use: No    Alcohol/week: 1.0 standard drink    Types: 1  Standard drinks or equivalent per week    Comment: RARE  . Drug use: No  . Sexual activity: Not Currently  Other Topics Concern  . Not on file  Social History Narrative  . Not on file   Social Determinants of Health   Financial Resource Strain:   . Difficulty of Paying Living Expenses:   Food Insecurity:   . Worried About Charity fundraiser in the Last Year:   . Arboriculturist in the Last Year:   Transportation Needs:   . Film/video editor (Medical):   [name] Lack of Transportation (Non-Medical):   Physical Activity:   . Days of Exercise per Week:   . Minutes of Exercise per Session:   Stress:   . Feeling  of Stress :   Social Connections:   . Frequency of Communication with Friends and Family:   . Frequency of Social Gatherings with Friends and Family:   . Attends Religious Services:   . Active Member of Clubs or Organizations:   . Attends Archivist Meetings:   [name] Marital Status:    Family History  Problem Relation Age of Onset  . Heart attack Father 71       Smoker  . Heart attack Brother 38  . Lung cancer Sister   . Colon cancer Neg Hx    Scheduled Meds: Continuous Infusions: . dextrose 5 % and 0.45% NaCl 100 mL/hr at 03/25/20 1004   PRN Meds:.morphine CONCENTRATE, ondansetron **OR** ondansetron (ZOFRAN) IV Medications Prior to Admission:  Prior to Admission medications   Medication Sig Start Date End Date Taking? Authorizing Provider  amLODipine (NORVASC) 5 MG tablet Take 1 tablet (5 mg total) by mouth daily. Patient taking differently: Take 5 mg by mouth every evening.  02/24/19 03/22/20 Yes Biagio Borg, MD  Ascorbic Acid (VITAMIN C) 500 MG CAPS Take 500 mg by mouth daily.    Yes [provider]  azelastine (OPTIVAR) 0.05 % ophthalmic solution Place 1 drop into both eyes 2 (two) times daily. 02/24/19  Yes Biagio Borg, MD  citalopram (CELEXA) 20 MG tablet TAKE 1 TABLET BY MOUTH EVERY DAY Patient taking differently: Take 20 mg by mouth  daily.  06/24/19  Yes Biagio Borg, MD  donepezil (ARICEPT) 5 MG tablet TAKE 1 TABLET BY MOUTH AT BEDTIME. ANNUAL APPT DUE IN MAY MUST SEE PROVIDER FOR FUTURE REFILLS Patient taking differently: Take 5 mg by mouth at bedtime.  03/22/20  Yes Biagio Borg, MD  gabapentin (NEURONTIN) 300 MG capsule TAKE 1 CAPSULE BY MOUTH TWICE A DAY Patient taking differently: Take 300 mg by mouth 2 (two) times daily.  06/30/19  Yes Biagio Borg, MD  hydrALAZINE (APRESOLINE) 25 MG tablet TAKE 1 TABLET (25 MG TOTAL) BY MOUTH EVERY 8 (EIGHT) HOURS. Patient taking differently: Take 25 mg by mouth every 8 (eight) hours.  12/12/19  Yes Biagio Borg, MD  isosorbide mononitrate (IMDUR) 30 MG 24 hr tablet Take 1 tablet (30 mg total) by mouth daily. Annual appt is due must see provider for future refills 12/18/19  Yes Biagio Borg, MD  lovastatin (MEVACOR) 40 MG tablet Take 1 tablet (40 mg total) by mouth at bedtime. 05/29/19  Yes Biagio Borg, MD  meclizine (ANTIVERT) 25 MG tablet Take 0.5-1 tablets (12.5-25 mg total) by mouth as needed for dizziness. TAKE 1/2 - 1 TABLET BY MOUTH THREE TIMES A DAY AS NEEDED FOR DIZINESS Patient taking differently: Take 12.5-25 mg by mouth daily as needed for dizziness.  02/24/19  Yes Biagio Borg, MD  nitroGLYCERIN (NITROSTAT) 0.4 MG SL tablet Place 1 tablet (0.4 mg total) under the tongue every 5 (five) minutes as needed for chest pain. 07/29/18  Yes Burnell Blanks, MD  pantoprazole (PROTONIX) 40 MG tablet TAKE 1 TABLET BY MOUTH EVERY DAY Patient taking differently: Take 40 mg by mouth every evening.  12/15/19  Yes Biagio Borg, MD  Polyethyl Glycol-Propyl Glycol (SYSTANE OP) Place 1 drop into both eyes daily.   Yes [provider]  pramipexole (MIRAPEX) 0.5 MG tablet TAKE 1 TABLET BY MOUTH 3 TIMES A DAY *ANNUAL APPT DUE IN MAY** Patient taking differently: Take 0.5 mg by mouth 3 (three) times daily.  12/15/19  Yes  Biagio Borg, MD  predniSONE (DELTASONE) 5 MG tablet Take 5  mg by mouth daily.    Yes [provider]  Thiamine HCl (VITAMIN B1) 50 MG TABS TAKE 1 TABLET BY MOUTH DAILY. ANNUAL APPT DUE IN MAY MUST SEE PROVIDER FOR FUTURE REFILLS 02/26/20  Yes Biagio Borg, MD  vitamin B-12 (CYANOCOBALAMIN) 1000 MCG tablet Take 1 tablet (1,000 mcg total) by mouth daily. 05/29/19  Yes Biagio Borg, MD  Vitamin D, Cholecalciferol, 50 MCG (2000 UT) CAPS Take 50 mcg by mouth daily.   Yes [provider]  abiraterone acetate (ZYTIGA) 250 MG tablet Take 1,000 mg by mouth at bedtime. Patient not taking: Reported on 03/22/2020 05/12/19   [provider]  amLODipine (NORVASC) 10 MG tablet TAKE 1 TABLET (10 MG TOTAL) BY MOUTH DAILY. ANNUAL APPT DUE IN MAY MUST SEE PROVIDER FOR FUTURE REFILLS Patient not taking: Reported on 03/22/2020 01/13/20   Biagio Borg, MD   Allergies  Allergen Reactions  . Amitriptyline Hcl Other (See Comments)    REACTION: confusion  . Diphenhydramine Hcl Other (See Comments)    Keeps him awake  . Simvastatin Other (See Comments)    REACTION: leg pain  . Clonidine Derivatives     Dizziness, falls, bradycardia  . Tylenol [Acetaminophen] Other (See Comments)    Dr advised pt not to take due to finding a spot on pt's kidney   Review of Systems  Physical Exam Constitutional:      Appearance: He is cachectic. He is ill-appearing.  Cardiovascular:     Rate and Rhythm: Normal rate.  Pulmonary:     Breath sounds: Decreased breath sounds present.     Comments: Periods of apnea Musculoskeletal:     Comments: Generalized atrophy  Skin:    General: Skin is warm and dry.  Neurological:     Mental Status: He is lethargic.     Vital Signs: BP (!) 178/91 (BP Location: Right Arm)   Pulse 72   Temp 98.9 F (37.2 C) (Axillary)   Resp 20   Wt 54.6 kg   SpO2 100%   BMI 20.03 kg/m  Pain Scale: PAINAD   Pain Score: 0-No pain   SpO2: SpO2: 100 % O2 Device:SpO2: 100 % O2 Flow Rate: .   IO: Intake/output summary:    Intake/Output Summary (Last 24 hours) at 03/25/2020 1344 Last data filed at 03/24/2020 1820 Gross per 24 hour  Intake --  Output 300 ml  Net -300 ml    LBM:   Baseline Weight: Weight: 53.3 kg Most recent weight: Weight: 54.6 kg     Palliative Assessment/Data: 20 % at best   Discussed with Dr. Alfredia Ferguson  Time In: 1230 Time Out: 1340 Time Total: 70 minutes Greater than 50%  of this time was spent counseling and coordinating care related to the above assessment and plan.  Signed by: Wadie Lessen, NP   Please contact Palliative Medicine Team phone at [phone] for questions and concerns.  For individual provider: See Shea Evans

## 2020-03-26 DIAGNOSIS — C61 Malignant neoplasm of prostate: Secondary | ICD-10-CM

## 2020-03-26 DIAGNOSIS — Z515 Encounter for palliative care: Secondary | ICD-10-CM

## 2020-03-26 DIAGNOSIS — G893 Neoplasm related pain (acute) (chronic): Secondary | ICD-10-CM

## 2020-03-26 DIAGNOSIS — C7951 Secondary malignant neoplasm of bone: Secondary | ICD-10-CM

## 2020-03-26 NOTE — Progress Notes (Signed)
Manufacturing engineer [facility]) Hospital Liaison note.     This patient has been accepted to transfer to [facility].   Registration paperwork has been completed. Please arrange transport with [facility].   RN please call report to [phone].   Thank you,     Farrel Gordon, RN, CCM       [place] (listed on [place] under [facility]osp[facility])     [phone]

## 2020-03-26 NOTE — Progress Notes (Signed)
Discharge to [facility].

## 2020-03-26 NOTE — Plan of Care (Signed)
Problem: Education: °Goal: Knowledge of General Education information will improve °Description: Including pain rating scale, medication(s)/side effects and non-pharmacologic comfort measures °Outcome: Not Met (add Reason) °  °Problem: Health Behavior/Discharge Planning: °Goal: Ability to manage health-related needs will improve °Outcome: Not Met (add Reason) °  °Problem: Clinical Measurements: °Goal: Ability to maintain clinical measurements within normal limits will improve °Outcome: Not Met (add Reason) °Goal: Will remain free from infection °Outcome: Not Met (add Reason) °Goal: Diagnostic test results will improve °Outcome: Not Met (add Reason) °Goal: Respiratory complications will improve °Outcome: Not Met (add Reason) °Goal: Cardiovascular complication will be avoided °Outcome: Not Met (add Reason) °  °Problem: Activity: °Goal: Risk for activity intolerance will decrease °Outcome: Not Met (add Reason) °  °Problem: Nutrition: °Goal: Adequate nutrition will be maintained °Outcome: Not Met (add Reason) °  °Problem: Coping: °Goal: Level of anxiety will decrease °Outcome: Not Met (add Reason) °  °Problem: Elimination: °Goal: Will not experience complications related to bowel motility °Outcome: Not Met (add Reason) °Goal: Will not experience complications related to urinary retention °Outcome: Not Met (add Reason) °  °Problem: Pain Managment: °Goal: General experience of comfort will improve °Outcome: Not Met (add Reason) °  °Problem: Safety: °Goal: Ability to remain free from injury will improve °Outcome: Not Met (add Reason) °  °Problem: Skin Integrity: °Goal: Risk for impaired skin integrity will decrease °Outcome: Not Met (add Reason) °  °

## 2020-03-26 NOTE — Social Work (Signed)
Clinical Social Worker facilitated patient discharge including contacting patient family and facility to confirm patient discharge plans.  Clinical information faxed to facility and family agreeable with plan.  CSW arranged ambulance transport via [facility] to [place] to call [phone]  with report prior to discharge.  Clinical Social Worker will sign off for now as social work intervention is no longer needed. Please consult [place] again if new need arises.  Westley Hummer, MSW, LCSW Clinical Social Worker

## 2020-03-26 NOTE — Progress Notes (Signed)
Report given to [facility] at [facility].

## 2020-03-26 NOTE — Discharge Summary (Signed)
Physician Discharge Summary  Victor Davis MWN:[id number] DOB: 07/01/1941 DOA: 03/22/2020  PCP: Biagio Borg, MD  Admit date: 03/22/2020 Discharge date: 03/26/2020  Admitted From: Home Disposition: Residential Hospice  Recommendations for Outpatient Follow-up:  1. Further Care per Hospice Protocol   Home Health: No Equipment/Devices: None   Discharge Condition: Guarded/Poor CODE STATUS: DO NOT RESUSCITATE Diet recommendation: CLD; Feeding for Comfort  Brief/Interim Summary: HPI per Dr. Soyla Murphy on 03/22/20 Victor Apley Atkinsis a 79 y.o.malewith medical history significant ofmetastatic prostate cancer, anemia of chronic disease, chronic thrombocytopenia, CKD stage IIIb, hypertension, hyperlipidemia, coronary artery disease status post stents to LAD,Lcxin 02/2012, carotid artery stenosis, CVA, dementia brought by EMS to the emergency department with generalized weakness and confusion.  Patient is currently on home hospice. Wife reports that patient has worsening mental status since yesterday, has been lethargic, decreased appetite, has not been eating or drinking since past 2 days, unable to walk due to generalized weakness, yesterday evening he became less responsive,difficult to arouse and his symptoms worsened this morning therefore she called 911 and brought patient to the emergency department for further evaluation and management for possible IV fluids and IV antibioticsbut does not want any heroic measures.  ED physician discussed with hospice coordinator-[facility] with no beds available at this time.  No history of head trauma, seizures, loss of consciousness, fever, chills, cough, congestion, nausea, vomiting, diarrhea.  ED Course:Upon arrival to ED: Patient tachypneic in 20s, blood pressure 157/91, afebrile with leukocytosis of 11.7, CBC shows anemia of chronic disease, platelet: 20, CMP shows worsening kidney function, potassium: WNL, anion gap: 16. Lactic acid: WNL.  Chest x-ray shows cardiomegaly, bibasilar opacity likely atelectasis. Blood culture, UA, COVID-19 pending. Patient received IV fluid bolus and Rocephin in the ED. [facility] consulted for admission.  **Interim History  Patient continued to be minimally interactive and somnolent with times of also being apneic.  He does not really arouse today and SLP evaluated and recommending strict n.p.o and repeat testing was canceled today.  Patient is a hospice patient and patient's family still would like to treat the treatable however his encephalopathy has not improved with antibiotics and his renal function is worsening yesterday.  He has a terminal diagnosis of prostate cancer and he has been more confused and wife felt that he was dehydrated.  He is not candidate for hemodialysis and antibiotics have been broadened.  I do not feel the patient can return home safely and feel the best option would be for residential hospice placement.  Today the hospice liaison offered them a bed at residential hospice however the patient's wife declined this.  Subsequently palliative care came by to address goals of care and the plan is to transition to full comfort measures and minimizing fluids to [facility] with no further diagnostic tests, IV sticks or IV medications.  Palliative care team has started patient on sublingual Roxanol for Comfort.  Patient's prognosis is extremely poor and I anticipate either an in-hospital death if the patient is not transferred to residential hospice as he cannot safely be discharged home. He has a bed available today at [facility] and will be transferred there for End of Life Care.   Discharge Diagnoses:  Principal Problem:   Acute metabolic encephalopathy Active Problems:   Hyperlipidemia   PROSTATE CANCER, HX OF   Hypertension   Acute on chronic kidney failure (HCC)   Dementia (HCC)   Depression   Thrombocytopenia (HCC)   Anemia due to chronic kidney disease  Generalized weakness   Pain, cancer   Prostate cancer metastatic to bone [facility])   Palliative care by specialist  Acute metabolic encephalopathymultifactorial: -Minimally interactive,family wants to treat thetreatable -Encephalopathy likelymultifactorialin the setting of dehydration/worsening kidney function/uremia/UTI but despite fluid resuscitation and treatment IV antibiotic patient's AMS is still not improving -Per documentation, patient is a hospice patient. Per [employer], patient's spouse -would like to treat the treatable but I feel that this may be a terminal event given that he is not improving. -Patient sodium is elevated at 147 -Continue IV fluidandIV antibiotics empirically. -Speech therapist consulted to evaluate safetyinswallowing and recommending strict n.p.o. -Aspiration precautions/fall precautions -Continue to monitor and trend and watch the patient closely -His ammonia level was 16 -We will have hospice readdress goals of care as I feel the patient clinically will not improve; hospice evaluated and they offered the patient's wife in bed at [facility] however she subsequently refused this; palliative care was further consulted and they had a very lengthy discussion with the patient's family and after the goals of care discussion it is clear Victor Davis verbalized to the palliative care team the main focus of the patient's care would be for comfort.  Subsequently all medications not in the patient's comfort were discontinued and IV fluids were minimized to [facility].  Patient will be transition to full comfort measures and sling will Roxanol was started.  We will obtain no further diagnostic tests, IV sticks or IV medications with patient currently being transitioned for comfort.  -Given the patient's advanced age, poor prognosis with his prostate cancer and unresponsiveness he is at very high risk for further decompensation and likely in-hospital death and this would not be  unexpected now that he is being transitioned to full comfort measures and that his antibiotics are being stopped.  He has a bed at Residential hospice so will be transferred there for End of Life Care.   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 -Patientis a hospice patient,has dementia and metastatic prostate carcinoma-heis apoor candidate for dialysis.  -Patient's CO2 was 16, chloride level 115, anion gap was 16 on last check  -Will hold off nephrology consult for now. -Monitor urine output -Avoid nephrotoxic medications, contrast dyes, hypotension and renally adjust medications -We will not check further labs given the patient being transitioned to comfort care; patient has had periods of apnea and he is essentially unresponsive now and imminent hospital death is pending if he is not transferred to residential hospice  Iron deficiency anemia/anemia of chronic disease -Hg drop 6.9 from 7.2; now patient's hemoglobin/hematocrit is 6.8/22.1 on last check  -PTA po iron supplement on hold due to NPO -We will need to discuss with the family about blood transfusion -Currently no signs and symptoms of bleeding -We will not check further labs given being transitioned to comfort care  Hyperkalemia likely secondary to acute renal failure -Presented with serum potassium 5.2 -Continue IV fluid and repeat potassium was 5.0 -Continue to monitor and trend and will not repeat CMP  Sepsis secondarypresumedUTI,history of pansensitive Pseudomonas aeruginosa UTI 11/16/19 -Presented with leukocytosis, WBC 13.1K, Respiration rate 24, and pyuria and likely source of infection UTI -UA positive for pyuria and showed a cloudy appearance with moderate hemoglobin, moderate  leukocytes, 100 protein, many bacteria, 21-50 RBCs per high-power field, and greater  than 50 WBCs -Urine culture ordered, still pending however antibiotics have been discontinued -WBC went from 11.7 trended up to 13.6 and is now trending back down to 13.0 -Blood cultures negative today, continue to follow cultures Previous urine culture positive for Pseudomonas aeruginosa, pansensitive4/421 -Change IV antibiotics to cefepime to cover Pseudomonas. -DC Rocephin -Continue IV fluidD5 1/2NS at 100 cc/hr x 2 days and this is now [facility] -Procalcitonin level was not done but lactic acid level 0.7 and repeat was 0.6  Hypernatremia/Hyperchloremia  -Patient sodium is trended up to 147 -Continued with fluids as above -We will not continue to monitor and trend CMP as patient is being transitioned to comfort care  History of metastatic prostate carcinoma: -Presented from home hospice -Hospice team will continue to follow and will address further goals of care discussion given that he is not improving. -Palliative care got involved and now patient is transition to full comfort care  Essential Hypertension: -Hold oral antihypertensives after passing swallow evaluation  -StartIVLopressor 5 mg daily however this was now stopped given the patient is being transitioned to comfort care -Hydralazine as needed for blood pressure more than 160/100 -Continue to monitor vital signs per protocol  Depression/hyperlipidemia/dementia/coronary artery disease: Continue to hold oral medications due to n.p.o.; clear liquid diet has been initiated for comfort  Severe protein calorie malnutrition -Low BMI -Muscle mass loss -Albumin 2.6 -N.p.o. due to encephalopathy, needs to pass swallowevaluation but he failed and is on a CLD for comfort   Patient isatincreased risk of morbidity and mortality given his advanced age, current condition with comorbidities, and lack of oral intake.  He has a very high risk  for inpatient decompensation and likely I feel that residential hospice would be the best option for the patient given his continued encephalopathy and worsening renal function.  Residential hospice bed was offered to the patient's family today however the patient is wife declined.  Patient has had periods of apnea I feel that because she has declined this patient has imminent hospital death pending given his poor prognosis and worsening status with periods of apnea.  Discharge Instructions  Discharge Instructions    Call MD for:  difficulty breathing, headache or visual disturbances   Complete by: As directed    Call MD for:  extreme fatigue   Complete by: As directed    Call MD for:  hives   Complete by: As directed    Call MD for:  persistant dizziness or light-headedness   Complete by: As directed    Call MD for:  persistant nausea and vomiting   Complete by: As directed    Call MD for:  redness, tenderness, or signs of infection (pain, swelling, redness, odor or green/yellow discharge around incision site)   Complete by: As directed    Call MD for:  severe uncontrolled pain   Complete by: As directed    Call MD for:  temperature >100.4   Complete by: As directed    Discharge instructions   Complete by: As directed    Further Care per Hospice Protocol   Increase activity slowly   Complete by: As directed      Allergies as of 03/26/2020      Reactions   Amitriptyline Hcl Other (See Comments)   REACTION: confusion   Diphenhydramine Hcl Other (See Comments)   Keeps him awake   Simvastatin Other (See Comments)   REACTION: leg pain   Clonidine Derivatives    Dizziness, falls, bradycardia   Tylenol [acetaminophen]  Other (See Comments)   Dr advised pt not to take due to finding a spot on pt's kidney      Medication List    STOP taking these medications   abiraterone acetate 250 MG tablet Commonly known as: ZYTIGA   amLODipine 10 MG tablet Commonly known as: NORVASC    amLODipine 5 MG tablet Commonly known as: NORVASC   azelastine 0.05 % ophthalmic solution Commonly known as: OPTIVAR   citalopram 20 MG tablet Commonly known as: CELEXA   donepezil 5 MG tablet Commonly known as: ARICEPT   gabapentin 300 MG capsule Commonly known as: NEURONTIN   hydrALAZINE 25 MG tablet Commonly known as: APRESOLINE   isosorbide mononitrate 30 MG 24 hr tablet Commonly known as: IMDUR   lovastatin 40 MG tablet Commonly known as: MEVACOR   meclizine 25 MG tablet Commonly known as: ANTIVERT   nitroGLYCERIN 0.4 MG SL tablet Commonly known as: NITROSTAT   pantoprazole 40 MG tablet Commonly known as: PROTONIX   pramipexole 0.5 MG tablet Commonly known as: MIRAPEX   predniSONE 5 MG tablet Commonly known as: DELTASONE   SYSTANE OP   vitamin B-12 1000 MCG tablet Commonly known as: CYANOCOBALAMIN   Vitamin B1 50 MG Tabs   Vitamin C 500 MG Caps   Vitamin D (Cholecalciferol) 50 MCG (2000 UT) Caps       Follow-up Information    Biagio Borg, MD .   Specialties: Internal Medicine, Radiology Contact information: [place] [address] [phone]        Burnell Blanks, MD .   Specialty: Cardiology Contact information: [place]. [address] [place] [address] [phone]              Allergies  Allergen Reactions  . Amitriptyline Hcl Other (See Comments)    REACTION: confusion  . Diphenhydramine Hcl Other (See Comments)    Keeps him awake  . Simvastatin Other (See Comments)    REACTION: leg pain  . Clonidine Derivatives     Dizziness, falls, bradycardia  . Tylenol [Acetaminophen] Other (See Comments)    Dr advised pt not to take due to finding a spot on pt's kidney   Consultations:  Palliative Care   Procedures/Studies: DG Chest 1 View  Result Date: 03/22/2020 CLINICAL DATA:  Decreased mental status EXAM: CHEST  1 VIEW COMPARISON:  11/16/2019 FINDINGS: Mild cardiomegaly. Bibasilar  opacities, likely atelectasis. No overt edema. No effusions or acute bony abnormality. Advanced degenerative changes in the shoulders. Aortic atherosclerosis. IMPRESSION: Cardiomegaly. Bibasilar opacities, likely atelectasis. Electronically Signed   By: Rolm Baptise M.D.   On: 03/22/2020 15:25    Subjective: Continues to be unresponsive and cannot provide a subjective history.  Continues to have some periods of apnea and has short shallow breathing.  Will transfer to residential hospice for end-of-life care.  Discharge Exam: Vitals:   03/25/20 1304 03/26/20 0440  BP: (!) 178/91 138/77  Pulse: 72 88  Resp: 20 17  Temp: 98.9 F (37.2 C) 97.6 F (36.4 C)  SpO2: 100% 96%   Vitals:   03/24/20 2006 03/25/20 0445 03/25/20 1304 03/26/20 0440  BP: (!) 168/79 (!) 174/71 (!) 178/91 138/77  Pulse: 96 91 72 88  Resp: 17 20 20 17   Temp: 97.9 F (36.6 C) 99.1 F (37.3 C) 98.9 F (37.2 C) 97.6 F (36.4 C)  TempSrc: Oral Oral Axillary   SpO2: 97% 98% 100% 96%  Weight:  54.6 kg  General: Pt is not alert or awake, not in acute distress resting comfortably Cardiovascular: RRR, S1/S2 +, no rubs, no gallops Respiratory: Diminished bilaterally, no wheezing, no rhonchi; Has short shallow breathing Abdominal: Soft, NT, ND, bowel sounds + Extremities: no appreciable LE edema, no cyanosis  The results of significant diagnostics from this hospitalization (including imaging, microbiology, ancillary and laboratory) are listed below for reference.    Microbiology: Recent Results (from the past 240 hour(s))  Blood culture (routine x 2)     Status: None (Preliminary result)   Collection Time: 03/22/20  2:15 PM   Specimen: BLOOD  Result Value Ref Range Status   Specimen Description BLOOD RIGHT ANTECUBITAL  Final   Special Requests   Final    BOTTLES DRAWN AEROBIC AND ANAEROBIC Blood Culture results may not be optimal due to an inadequate volume of blood received in culture bottles   Culture   Final     NO GROWTH 4 DAYS Performed at [facility], [place] [address], [place], [place] [address]    Report Status PENDING  Incomplete  Blood culture (routine x 2)     Status: None (Preliminary result)   Collection Time: 03/22/20  2:39 PM   Specimen: BLOOD  Result Value Ref Range Status   Specimen Description BLOOD LEFT ANTECUBITAL  Final   Special Requests   Final    BOTTLES DRAWN AEROBIC AND ANAEROBIC Blood Culture results may not be optimal due to an inadequate volume of blood received in culture bottles   Culture   Final    NO GROWTH 4 DAYS Performed at [facility], [place] [address] [place], [place] [address]    Report Status PENDING  Incomplete  SARS Coronavirus 2 by RT PCR (hospital order, performed in [facility] hospital lab) Nasopharyngeal Nasopharyngeal Swab     Status: None   Collection Time: 03/22/20  4:30 PM   Specimen: Nasopharyngeal Swab  Result Value Ref Range Status   SARS Coronavirus 2 NEGATIVE NEGATIVE Final    Comment: (NOTE) SARS-CoV-2 target nucleic acids are NOT DETECTED.  The SARS-CoV-2 RNA is generally detectable in upper and lower respiratory specimens during the acute phase of infection. The lowest concentration of SARS-CoV-2 viral copies this assay can detect is 250 copies / mL. A negative result does not preclude SARS-CoV-2 infection and should not be used as the sole basis for treatment or other patient management decisions.  A negative result may occur with improper specimen collection / handling, submission of specimen other than nasopharyngeal swab, presence of viral mutation(s) within the areas targeted by this assay, and inadequate number of viral copies (<250 copies / mL). A negative result must be combined with clinical observations, patient history, and epidemiological information.  Fact Sheet for Patients:   StrictlyIdeas.no  Fact Sheet for Healthcare  Providers: BankingDealers.co.za  This test is not yet approved or  cleared by the [place] FDA and has been authorized for detection and/or diagnosis of SARS-CoV-2 by FDA under an Emergency Use Authorization (EUA).  This EUA will remain in effect (meaning this test can be used) for the duration of the COVID-19 declaration under Section 564(b)(1) of the Act, 21 U.S.C. section 360bbb-3(b)(1), unless the authorization is terminated or revoked sooner.  Performed at [facility], [place] [address] [place], [place] [address]   Culture, Urine     Status: Abnormal   Collection Time: 03/24/20 10:30 PM   Specimen: Urine, Random  Result Value Ref Range Status  Specimen Description URINE, RANDOM  Final   Special Requests NONE  Final   Culture (A)  Final    <10,000 COLONIES/mL INSIGNIFICANT GROWTH Performed at [facility], [place] [address] [place], [place] [address]    Report Status 03/25/2020 FINAL  Final    Labs: BNP (last 3 results) No results for input(s): BNP in the last 8760 hours. Basic Metabolic Panel: Recent Labs  Lab 03/22/20 1430 03/22/20 1900 03/23/20 0409 03/23/20 1817 03/24/20 0053  NA 140  --  143 144 147*  K 5.1  --  5.2* 5.1 5.0  CL 106  --  111 115* 115*  CO2 18*  --  15* 15* 16*  GLUCOSE 103*  --  80 117* 114*  BUN 109*  --  105* 109* 109*  CREATININE 7.63* 7.19* 7.56* 7.68* 7.75*  CALCIUM 9.4  --  8.7* 8.9 9.1  MG  --  2.0  --   --   --   PHOS  --  5.4*  --   --   --    Liver Function Tests: Recent Labs  Lab 03/23/20 0409  AST 17  ALT 9  ALKPHOS 55  BILITOT 0.5  PROT 5.9*  ALBUMIN 2.6*   No results for input(s): LIPASE, AMYLASE in the last 168 hours. Recent Labs  Lab 03/22/20 1900  AMMONIA 16   CBC: Recent Labs  Lab 03/22/20 1430 03/22/20 1900 03/23/20 0409 03/23/20 1817 03/24/20 0053  WBC 11.7* 13.1* 13.5* 13.6* 13.0*  NEUTROABS 10.2*  --   --   --  11.7*  HGB 7.1* 7.2* 6.9* 7.1* 6.8*  HCT 23.4*  24.0* 22.6* 23.4* 22.1*  MCV 94.4 97.2 96.2 95.5 93.6  PLT 120* 126* 124* 128* 124*   Cardiac Enzymes: No results for input(s): CKTOTAL, CKMB, CKMBINDEX, TROPONINI in the last 168 hours. BNP: Invalid input(s): POCBNP CBG: No results for input(s): GLUCAP in the last 168 hours. D-Dimer No results for input(s): DDIMER in the last 72 hours. Hgb A1c No results for input(s): HGBA1C in the last 72 hours. Lipid Profile No results for input(s): CHOL, HDL, LDLCALC, TRIG, CHOLHDL, LDLDIRECT in the last 72 hours. Thyroid function studies No results for input(s): TSH, T4TOTAL, T3FREE, THYROIDAB in the last 72 hours.  Invalid input(s): FREET3 Anemia work up No results for input(s): VITAMINB12, FOLATE, FERRITIN, TIBC, IRON, RETICCTPCT in the last 72 hours. Urinalysis    Component Value Date/Time   COLORURINE YELLOW 03/22/2020 1630   APPEARANCEUR CLOUDY (A) 03/22/2020 1630   LABSPEC 1.010 03/22/2020 1630   PHURINE 7.0 03/22/2020 1630   GLUCOSEU NEGATIVE 03/22/2020 1630   GLUCOSEU NEGATIVE 12/25/2018 1043   HGBUR MODERATE (A) 03/22/2020 1630   BILIRUBINUR NEGATIVE 03/22/2020 1630   KETONESUR NEGATIVE 03/22/2020 1630   PROTEINUR 100 (A) 03/22/2020 1630   UROBILINOGEN 0.2 12/25/2018 1043   NITRITE NEGATIVE 03/22/2020 1630   LEUKOCYTESUR MODERATE (A) 03/22/2020 1630   Sepsis Labs Invalid input(s): PROCALCITONIN,  WBC,  LACTICIDVEN Microbiology Recent Results (from the past 240 hour(s))  Blood culture (routine x 2)     Status: None (Preliminary result)   Collection Time: 03/22/20  2:15 PM   Specimen: BLOOD  Result Value Ref Range Status   Specimen Description BLOOD RIGHT ANTECUBITAL  Final   Special Requests   Final    BOTTLES DRAWN AEROBIC AND ANAEROBIC Blood Culture results may not be optimal due to an inadequate volume of blood received in culture bottles   Culture   Final  NO GROWTH 4 DAYS Performed at [facility], [place] [address] [place], [place] [address]    Report  Status PENDING  Incomplete  Blood culture (routine x 2)     Status: None (Preliminary result)   Collection Time: 03/22/20  2:39 PM   Specimen: BLOOD  Result Value Ref Range Status   Specimen Description BLOOD LEFT ANTECUBITAL  Final   Special Requests   Final    BOTTLES DRAWN AEROBIC AND ANAEROBIC Blood Culture results may not be optimal due to an inadequate volume of blood received in culture bottles   Culture   Final    NO GROWTH 4 DAYS Performed at [facility], [place] [address] [place], [place] [address]    Report Status PENDING  Incomplete  SARS Coronavirus 2 by RT PCR (hospital order, performed in [facility] hospital lab) Nasopharyngeal Nasopharyngeal Swab     Status: None   Collection Time: 03/22/20  4:30 PM   Specimen: Nasopharyngeal Swab  Result Value Ref Range Status   SARS Coronavirus 2 NEGATIVE NEGATIVE Final    Comment: (NOTE) SARS-CoV-2 target nucleic acids are NOT DETECTED.  The SARS-CoV-2 RNA is generally detectable in upper and lower respiratory specimens during the acute phase of infection. The lowest concentration of SARS-CoV-2 viral copies this assay can detect is 250 copies / mL. A negative result does not preclude SARS-CoV-2 infection and should not be used as the sole basis for treatment or other patient management decisions.  A negative result may occur with improper specimen collection / handling, submission of specimen other than nasopharyngeal swab, presence of viral mutation(s) within the areas targeted by this assay, and inadequate number of viral copies (<250 copies / mL). A negative result must be combined with clinical observations, patient history, and epidemiological information.  Fact Sheet for Patients:   StrictlyIdeas.no  Fact Sheet for Healthcare Providers: BankingDealers.co.za  This test is not yet approved or  cleared by the [place] FDA and has been authorized for detection and/or  diagnosis of SARS-CoV-2 by FDA under an Emergency Use Authorization (EUA).  This EUA will remain in effect (meaning this test can be used) for the duration of the COVID-19 declaration under Section 564(b)(1) of the Act, 21 U.S.C. section 360bbb-3(b)(1), unless the authorization is terminated or revoked sooner.  Performed at [facility], [place] [address], [place], [place] [address]   Culture, Urine     Status: Abnormal   Collection Time: 03/24/20 10:30 PM   Specimen: Urine, Random  Result Value Ref Range Status   Specimen Description URINE, RANDOM  Final   Special Requests NONE  Final   Culture (A)  Final    <10,000 COLONIES/mL INSIGNIFICANT GROWTH Performed at [facility], [place] [address] [place], [place] [address]    Report Status 03/25/2020 FINAL  Final   Time coordinating discharge: 25 minutes  SIGNED:  Kerney Elbe, DO [facility] 03/26/2020, 11:51 AM Pager is on [place]  If 7PM-7AM, please contact night-coverage [email or web address]

## 2020-03-26 NOTE — Social Work (Addendum)
12:20pm- Aware all consents etc are complete, will fax d/c summary to [facility]. RN Joaquim Lai aware of d/c and will call report. Pt is active [facility] pt so will utilize [facility] for pick up.  11:05am-CSW aware bed available at [facility] today. [facility] liaison Bevely Palmer to speak with pt wife today.   CSW continuing to follow for support with disposition when pt plan in place.  Westley Hummer, MSW, [employer]

## 2020-03-27 LAB — CULTURE, BLOOD (ROUTINE X 2)
Culture: NO GROWTH
Culture: NO GROWTH

## 2020-04-06 ENCOUNTER — Telehealth: Payer: Self-pay | Admitting: Internal Medicine

## 2020-04-06 NOTE — Telephone Encounter (Signed)
New message:   FYI:  Pt's wife just called and states the pt passed away on 2020-04-21 and she just wanted to inform Dr. Jenny Reichmann of this.

## 2020-04-07 NOTE — Telephone Encounter (Signed)
Sent to Dr. John. 

## 2020-04-14 DEATH — deceased

## 2020-05-25 ENCOUNTER — Other Ambulatory Visit: Payer: Medicare Other

## 2020-05-27 ENCOUNTER — Ambulatory Visit: Payer: Medicare Other | Admitting: Internal Medicine

## 2020-08-11 ENCOUNTER — Ambulatory Visit: Payer: Medicare Other | Admitting: Cardiovascular Disease
